# Patient Record
Sex: Female | Born: 1950 | Race: White | Hispanic: No | Smoking: Never smoker
Health system: Southern US, Community
[De-identification: ages and names within clinical notes are randomized; demographics above are authoritative.]

## PROBLEM LIST (undated history)

## (undated) DIAGNOSIS — IMO0001 Reserved for inherently not codable concepts without codable children: Secondary | ICD-10-CM

## (undated) DIAGNOSIS — C541 Malignant neoplasm of endometrium: Principal | ICD-10-CM

## (undated) DIAGNOSIS — I38 Endocarditis, valve unspecified: Secondary | ICD-10-CM

## (undated) DIAGNOSIS — I499 Cardiac arrhythmia, unspecified: Secondary | ICD-10-CM

## (undated) DIAGNOSIS — R011 Cardiac murmur, unspecified: Secondary | ICD-10-CM

## (undated) DIAGNOSIS — Z5189 Encounter for other specified aftercare: Secondary | ICD-10-CM

## (undated) DIAGNOSIS — I890 Lymphedema, not elsewhere classified: Secondary | ICD-10-CM

## (undated) DIAGNOSIS — I10 Essential (primary) hypertension: Secondary | ICD-10-CM

## (undated) DIAGNOSIS — I350 Nonrheumatic aortic (valve) stenosis: Secondary | ICD-10-CM

## (undated) DIAGNOSIS — C439 Malignant melanoma of skin, unspecified: Secondary | ICD-10-CM

## (undated) DIAGNOSIS — E785 Hyperlipidemia, unspecified: Secondary | ICD-10-CM

## (undated) DIAGNOSIS — I509 Heart failure, unspecified: Secondary | ICD-10-CM

## (undated) DIAGNOSIS — F32A Depression, unspecified: Secondary | ICD-10-CM

## (undated) DIAGNOSIS — H269 Unspecified cataract: Secondary | ICD-10-CM

## (undated) DIAGNOSIS — F329 Major depressive disorder, single episode, unspecified: Secondary | ICD-10-CM

## (undated) DIAGNOSIS — L570 Actinic keratosis: Secondary | ICD-10-CM

## (undated) DIAGNOSIS — Z9289 Personal history of other medical treatment: Secondary | ICD-10-CM

## (undated) DIAGNOSIS — I951 Orthostatic hypotension: Secondary | ICD-10-CM

## (undated) HISTORY — PX: MELANOMA EXCISION: SHX5266

## (undated) HISTORY — PX: TONSILLECTOMY: SUR1361

## (undated) HISTORY — DX: Malignant neoplasm of endometrium: C54.1

## (undated) HISTORY — DX: Encounter for other specified aftercare: Z51.89

## (undated) HISTORY — PX: CARDIAC VALVE REPLACEMENT: SHX585

## (undated) HISTORY — DX: Essential (primary) hypertension: I10

## (undated) HISTORY — DX: Heart failure, unspecified: I50.9

## (undated) HISTORY — DX: Actinic keratosis: L57.0

## (undated) HISTORY — PX: EYE SURGERY: SHX253

## (undated) HISTORY — DX: Malignant melanoma of skin, unspecified: C43.9

## (undated) HISTORY — PX: OVARIAN CYST REMOVAL: SHX89

## (undated) HISTORY — DX: Lymphedema, not elsewhere classified: I89.0

## (undated) HISTORY — PX: CORONARY ARTERY BYPASS GRAFT: SHX141

## (undated) HISTORY — PX: CARDIAC CATHETERIZATION: SHX172

## (undated) HISTORY — DX: Depression, unspecified: F32.A

## (undated) HISTORY — DX: Hyperlipidemia, unspecified: E78.5

## (undated) HISTORY — DX: Unspecified cataract: H26.9

## (undated) HISTORY — DX: Major depressive disorder, single episode, unspecified: F32.9

## (undated) HISTORY — DX: Nonrheumatic aortic (valve) stenosis: I35.0

---

## 2009-11-24 DIAGNOSIS — Z85828 Personal history of other malignant neoplasm of skin: Secondary | ICD-10-CM | POA: Insufficient documentation

## 2010-06-07 ENCOUNTER — Ambulatory Visit: Payer: Self-pay

## 2010-06-23 ENCOUNTER — Ambulatory Visit: Payer: Self-pay | Admitting: Family Medicine

## 2010-06-23 DIAGNOSIS — I359 Nonrheumatic aortic valve disorder, unspecified: Secondary | ICD-10-CM

## 2013-03-18 ENCOUNTER — Ambulatory Visit: Payer: Self-pay

## 2013-03-18 LAB — HM PAP SMEAR: HM Pap smear: NORMAL

## 2013-03-23 LAB — HM MAMMOGRAPHY: HM MAMMO: NORMAL

## 2014-01-15 LAB — LIPID PANEL
CHOLESTEROL: 231 mg/dL — AB (ref 0–200)
HDL: 49 mg/dL (ref 35–70)
LDL Cholesterol: 137 mg/dL
Triglycerides: 226 mg/dL — AB (ref 40–160)

## 2014-01-18 LAB — HEMOGLOBIN A1C: HEMOGLOBIN A1C: 5.4 % (ref 4.0–6.0)

## 2014-05-24 HISTORY — PX: OTHER SURGICAL HISTORY: SHX169

## 2014-08-21 ENCOUNTER — Encounter: Payer: Self-pay | Admitting: Family Medicine

## 2014-08-21 DIAGNOSIS — N393 Stress incontinence (female) (male): Secondary | ICD-10-CM | POA: Insufficient documentation

## 2014-08-21 DIAGNOSIS — I499 Cardiac arrhythmia, unspecified: Secondary | ICD-10-CM | POA: Insufficient documentation

## 2014-08-21 DIAGNOSIS — E8881 Metabolic syndrome: Secondary | ICD-10-CM | POA: Insufficient documentation

## 2014-08-21 DIAGNOSIS — Z952 Presence of prosthetic heart valve: Secondary | ICD-10-CM | POA: Insufficient documentation

## 2014-08-21 DIAGNOSIS — Z8619 Personal history of other infectious and parasitic diseases: Secondary | ICD-10-CM | POA: Insufficient documentation

## 2014-08-21 DIAGNOSIS — I10 Essential (primary) hypertension: Secondary | ICD-10-CM | POA: Insufficient documentation

## 2014-08-21 DIAGNOSIS — E785 Hyperlipidemia, unspecified: Secondary | ICD-10-CM | POA: Insufficient documentation

## 2014-08-21 DIAGNOSIS — I350 Nonrheumatic aortic (valve) stenosis: Secondary | ICD-10-CM | POA: Insufficient documentation

## 2014-08-21 DIAGNOSIS — F330 Major depressive disorder, recurrent, mild: Secondary | ICD-10-CM | POA: Insufficient documentation

## 2014-08-21 DIAGNOSIS — N951 Menopausal and female climacteric states: Secondary | ICD-10-CM | POA: Insufficient documentation

## 2014-08-23 ENCOUNTER — Ambulatory Visit (INDEPENDENT_AMBULATORY_CARE_PROVIDER_SITE_OTHER): Payer: BLUE CROSS/BLUE SHIELD | Admitting: Family Medicine

## 2014-08-23 ENCOUNTER — Encounter: Payer: Self-pay | Admitting: Family Medicine

## 2014-08-23 VITALS — BP 138/84 | HR 64 | Temp 98.7°F | Resp 18 | Ht 69.0 in | Wt 284.4 lb

## 2014-08-23 DIAGNOSIS — F329 Major depressive disorder, single episode, unspecified: Secondary | ICD-10-CM | POA: Diagnosis not present

## 2014-08-23 DIAGNOSIS — E8881 Metabolic syndrome: Secondary | ICD-10-CM

## 2014-08-23 DIAGNOSIS — I359 Nonrheumatic aortic valve disorder, unspecified: Secondary | ICD-10-CM | POA: Diagnosis not present

## 2014-08-23 DIAGNOSIS — E669 Obesity, unspecified: Secondary | ICD-10-CM

## 2014-08-23 DIAGNOSIS — I10 Essential (primary) hypertension: Secondary | ICD-10-CM

## 2014-08-23 DIAGNOSIS — Z1211 Encounter for screening for malignant neoplasm of colon: Secondary | ICD-10-CM | POA: Diagnosis not present

## 2014-08-23 DIAGNOSIS — I890 Lymphedema, not elsewhere classified: Secondary | ICD-10-CM

## 2014-08-23 DIAGNOSIS — E668 Other obesity: Secondary | ICD-10-CM

## 2014-08-23 DIAGNOSIS — E785 Hyperlipidemia, unspecified: Secondary | ICD-10-CM

## 2014-08-23 DIAGNOSIS — F32A Depression, unspecified: Secondary | ICD-10-CM

## 2014-08-23 MED ORDER — METOPROLOL TARTRATE 50 MG PO TABS: 50.0000 mg | ORAL_TABLET | Freq: Two times a day (BID) | ORAL | Status: DC

## 2014-08-23 MED ORDER — HYDRALAZINE HCL 25 MG PO TABS: 25.0000 mg | ORAL_TABLET | Freq: Two times a day (BID) | ORAL | Status: DC

## 2014-08-23 MED ORDER — TELMISARTAN-HCTZ 80-25 MG PO TABS: 1.0000 | ORAL_TABLET | Freq: Every day | ORAL | Status: DC

## 2014-08-23 MED ORDER — PRAVASTATIN SODIUM 40 MG PO TABS: 40.0000 mg | ORAL_TABLET | Freq: Every day | ORAL | Status: DC

## 2014-08-23 NOTE — Patient Instructions (Addendum)
Check with insurance about Shingles vaccine coverage  Obesity Obesity is defined as having too much total body fat and a body mass index (BMI) of 30 or more. BMI is an estimate of body fat and is calculated from your height and weight. Obesity happens when you consume more calories than you can burn by exercising or performing daily physical tasks. Prolonged obesity can cause major illnesses or emergencies, such as:   Stroke.  Heart disease.  Diabetes.  Cancer.  Arthritis.  High blood pressure (hypertension).  High cholesterol.  Sleep apnea.  Erectile dysfunction.  Infertility problems. CAUSES   Regularly eating unhealthy foods.  Physical inactivity.  Certain disorders, such as an underactive thyroid (hypothyroidism), Cushing's syndrome, and polycystic ovarian syndrome.  Certain medicines, such as steroids, some depression medicines, and antipsychotics.  Genetics.  Lack of sleep. DIAGNOSIS  A health care provider can diagnose obesity after calculating your BMI. Obesity will be diagnosed if your BMI is 30 or higher.  There are other methods of measuring obesity levels. Some other methods include measuring your skinfold thickness, your waist circumference, and comparing your hip circumference to your waist circumference. TREATMENT  A healthy treatment program includes some or all of the following:  Long-term dietary changes.  Exercise and physical activity.  Behavioral and lifestyle changes.  Medicine only under the supervision of your health care provider. Medicines may help, but only if they are used with diet and exercise programs. An unhealthy treatment program includes:  Fasting.  Fad diets.  Supplements and drugs. These choices do not succeed in long-term weight control.  HOME CARE INSTRUCTIONS   Exercise and perform physical activity as directed by your health care provider. To increase physical activity, try the following:  Use stairs instead of  elevators.  Park farther away from store entrances.  Garden, bike, or walk instead of watching television or using the computer.  Eat healthy, low-calorie foods and drinks on a regular basis. Eat more fruits and vegetables. Use low-calorie cookbooks or take healthy cooking classes.  Limit fast food, sweets, and processed snack foods.  Eat smaller portions.  Keep a daily journal of everything you eat. There are many free websites to help you with this. It may be helpful to measure your foods so you can determine if you are eating the correct portion sizes.  Avoid drinking alcohol. Drink more water and drinks without calories.  Take vitamins and supplements only as recommended by your health care provider.  Weight-loss support groups, Tax adviser, counselors, and stress reduction education can also be very helpful. SEEK IMMEDIATE MEDICAL CARE IF:  You have chest pain or tightness.  You have trouble breathing or feel short of breath.  You have weakness or leg numbness.  You feel confused or have trouble talking.  You have sudden changes in your vision. MAKE SURE YOU:  Understand these instructions.  Will watch your condition.  Will get help right away if you are not doing well or get worse. Document Released: 03/08/2004 Document Revised: 06/15/2013 Document Reviewed: 03/07/2011 [facility] Patient Information 2015 [place], [place]. This information is not intended to replace advice given to you by your health care provider. Make sure you discuss any questions you have with your health care provider.

## 2014-08-23 NOTE — Progress Notes (Signed)
Name: Melody Soto   MRN: [id number]    DOB: [birth year]   Date:08/23/2014       Progress Note  Subjective  Chief Complaint  Chief Complaint  Patient presents with  . Medication Refill  . Hypertension    edema in bilateral feet  . Hyperlipidemia    HPI  HTN: taking medication, but she has not been taking Triamterene/HCTZ, she has noticed weight gain and worsening of lower extremity edema.  BP has been at goal  Hyperlipidemia: taking Pravastatin, every other day, because it was causing shoulder pain.  Sometimes she forgets to take medication  Obesity: eating a good breakfast, but very light lunch , or snack for lunch and eats most of her calories at night. Not exercising.  Depression: she does not want to take medications, she states she feels sorry for herself but not daily.  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   Metabolic Syndrome: WEXH3Z 5.4% six months ago, discussed diet and exercise ago.   Patient Active Problem List   Diagnosis Date Noted  . Aortic valve disorder 08/21/2014  . Benign essential HTN 08/21/2014  . Clinical depression 08/21/2014  . Dyslipidemia 08/21/2014  . Cardiac dysrhythmia 08/21/2014  . Personal history of disorder of nervous system and sense organs 08/21/2014  . Dysmetabolic syndrome 16/96/7893  . Extreme obesity 08/21/2014  . Symptomatic menopausal or female climacteric states 08/21/2014  . 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 History of shingles 08/21/2014  . Personal history of skin cancer 11/24/2009    Past Surgical History  Procedure Laterality Date  . Tonsillectomy    . Melanoma excision    . Ovarian cyst removal    . Cataract surgery Bilateral 05/24/2014    second eye 06/07/2014    Family History  Problem Relation Age of Onset  . Cancer Mother   . Stroke Mother   . Diabetes Sister   . Hypertension Sister     History   Social History  .  Marital Status: Married    Spouse Name: N/A  . Number of Children: N/A  . Years of Education: N/A   Occupational History  . Not on file.   Social History Main Topics  . Smoking status: Never Smoker   . Smokeless tobacco: Never Used  . Alcohol Use: 0.0 oz/week    0 Standard drinks or equivalent per week     Comment: Minimal alcohol consumption  . Drug Use: No  . Sexual Activity: Not Currently   Other Topics Concern  . Not on file   Social History Narrative  . No narrative on file     Current outpatient prescriptions:  .  Ascorbic Acid 100 MG LPOP, Take by mouth., Disp: , Rfl:  .  aspirin 81 MG chewable tablet, Chew by mouth., Disp: , Rfl:  .  B Complex-C (B-COMPLEX WITH VITAMIN C) tablet, Take 1 tablet by mouth daily., Disp: , Rfl:  .  Chromium-Cinnamon (CINNAMON PLUS CHROMIUM) 50-500 MCG-MG CAPS, Take by mouth., Disp: , Rfl:  .  Coenzyme Q10 (COQ10) 50 MG CAPS, Take by mouth., Disp: , Rfl:  .  hydrALAZINE (APRESOLINE) 25 MG tablet, Take 1 tablet (25 mg total) by mouth 2 (two) times daily., Disp: 180 tablet, Rfl: 1 .  Magnesium 100 MG CAPS, Take by mouth., Disp: , Rfl:  .  metoprolol (LOPRESSOR) 50 MG tablet, Take 1 tablet (50 mg total) by mouth 2 (two) times daily.,  Disp: 180 tablet, Rfl: 1 .  MULTIPLE VITAMIN PO, , Disp: , Rfl:  .  Omega-3 Fatty Acids (FISH OIL CONCENTRATE) 300 MG CAPS, Take by mouth., Disp: , Rfl:  .  pravastatin (PRAVACHOL) 40 MG tablet, Take 1 tablet (40 mg total) by mouth daily., Disp: 90 tablet, Rfl: 1 .  telmisartan-hydrochlorothiazide (MICARDIS HCT) 80-25 MG per tablet, Take 1 tablet by mouth daily., Disp: 90 tablet, Rfl: 1  Allergies  Allergen Reactions  . Ace Inhibitors      ROS  Constitutional: Negative for fever but positive for weight change - gained 10 lbs Respiratory: Negative for cough and shortness of breath.   Cardiovascular: Negative for chest pain or palpitations.  Gastrointestinal: Negative for abdominal pain, no bowel changes.   Musculoskeletal: Negative for gait problem or joint swelling.  Skin: Negative for rash.  Neurological: Negative for dizziness or headache.  No other specific complaints in a complete review of systems (except as listed in HPI above).  Objective  Filed Vitals:   08/23/14 1105  BP: 138/84  Pulse: 64  Temp: 98.7 F (37.1 C)  TempSrc: Oral  Resp: 18  Height: 5\' 9"  (1.753 m)  Weight: 284 lb 6.4 oz (129.003 kg)  SpO2: 93%    Body mass index is 41.98 kg/(m^2).  Physical Exam  Constitutional: Patient appears well-developed and well-nourished. No distress. Obesity  Eyes:  No scleral icterus.  Neck: Normal range of motion. Neck supple. Cardiovascular: Normal rate, regular rhythm . Loud systolic ejection murmur audible on 2nd intercostal space bilaterally, but louder on right side and radiating to left side of neck. Lymphedema. Pulmonary/Chest: Effort normal and breath sounds normal. No respiratory distress. Abdominal: Soft.  There is no tenderness. Psychiatric: Patient has a normal mood and affect. behavior is normal. Judgment and thought content normal.   PHQ2/9: Depression screen PHQ 2/9 08/23/2014  Decreased Interest 0  Down, Depressed, Hopeless 0  PHQ - 2 Score 0    Fall Risk: Fall Risk  08/23/2014  Falls in the past year? No    Assessment & Plan  1. Benign essential HTN We will add HCTZ - telmisartan-hydrochlorothiazide (MICARDIS HCT) 80-25 MG per tablet; Take 1 tablet by mouth daily.  Dispense: 90 tablet; Refill: 1 - metoprolol (LOPRESSOR) 50 MG tablet; Take 1 tablet (50 mg total) by mouth 2 (two) times daily.  Dispense: 180 tablet; Refill: 1 - hydrALAZINE (APRESOLINE) 25 MG tablet; Take 1 tablet (25 mg total) by mouth 2 (two) times daily.  Dispense: 180 tablet; Refill: 1 - Comprehensive Metabolic Panel (CMET)  2. Dyslipidemia  - pravastatin (PRAVACHOL) 40 MG tablet; Take 1 tablet (40 mg total) by mouth daily.  Dispense: 90 tablet; Refill: 1 - Lipid Profile  3.  Extreme obesity Discussed diet and exercise  4. Clinical depression Stable, does not want medication   5. Dysmetabolic syndrome  - HgB F0Y  6. Colon cancer screening  - Ambulatory referral to Gastroenterology  7. Aortic valve disorder Discussed repeating Echo but she wants to hold off for now

## 2014-09-06 ENCOUNTER — Telehealth: Payer: Self-pay | Admitting: Gastroenterology

## 2014-09-06 NOTE — Telephone Encounter (Signed)
Gastroenterology Pre-Procedure Review  Request Date: 10-15-2014 Requesting Physician: Dr. Allen Norris  PATIENT REVIEW QUESTIONS: The patient responded to the following health history questions as indicated:    1. Are you having any GI issues? no 2. Do you have a personal history of Polyps? no 3. Do you have a family history of Colon Cancer or Polyps? no 4. Diabetes Mellitus? no 5. Joint replacements in the past 12 months?no 6. Major health problems in the past 3 months?no 7. Any artificial heart valves, MVP, or defibrillator?no    MEDICATIONS & ALLERGIES:    Patient reports the following regarding taking any anticoagulation/antiplatelet therapy:   Plavix, Coumadin, Eliquis, Xarelto, Lovenox, Pradaxa, Brilinta, or Effient? no Aspirin? yes (Daily)  Patient confirms/reports the following medications:  Current Outpatient Prescriptions  Medication Sig Dispense Refill   Ascorbic Acid 100 MG LPOP Take by mouth.     aspirin 81 MG chewable tablet Chew by mouth.     B Complex-C (B-COMPLEX WITH VITAMIN C) tablet Take 1 tablet by mouth daily.     Chromium-Cinnamon (CINNAMON PLUS CHROMIUM) 50-500 MCG-MG CAPS Take by mouth.     Coenzyme Q10 (COQ10) 50 MG CAPS Take by mouth.     hydrALAZINE (APRESOLINE) 25 MG tablet Take 1 tablet (25 mg total) by mouth 2 (two) times daily. 180 tablet 1   Magnesium 100 MG CAPS Take by mouth.     metoprolol (LOPRESSOR) 50 MG tablet Take 1 tablet (50 mg total) by mouth 2 (two) times daily. 180 tablet 1   MULTIPLE VITAMIN PO      Omega-3 Fatty Acids (FISH OIL CONCENTRATE) 300 MG CAPS Take by mouth.     pravastatin (PRAVACHOL) 40 MG tablet Take 1 tablet (40 mg total) by mouth daily. 90 tablet 1   telmisartan-hydrochlorothiazide (MICARDIS HCT) 80-25 MG per tablet Take 1 tablet by mouth daily. 90 tablet 1   No current facility-administered medications for this visit.    Patient confirms/reports the following allergies:  Allergies  Allergen Reactions   Ace  Inhibitors     No orders of the defined types were placed in this encounter.    AUTHORIZATION INFORMATION Primary Insurance: 1D#: Group #:  Secondary Insurance: 1D#: Group #:  SCHEDULE INFORMATION: Date: 10-15-2014 Time: Location:[facility]

## 2014-09-24 ENCOUNTER — Other Ambulatory Visit: Payer: Self-pay

## 2014-11-24 ENCOUNTER — Encounter: Payer: Self-pay | Admitting: Anesthesiology

## 2014-11-24 ENCOUNTER — Telehealth: Payer: Self-pay

## 2014-11-24 NOTE — Telephone Encounter (Signed)
LVM for pt to return my call to discuss her procedure that is scheduled for 12/01/14. [place] called stating the Anesthesiologist is requesting pt have an ECHO per Dr. Ancil Boozer previous note in July prior to allowing her to come to [facility].

## 2014-11-25 ENCOUNTER — Other Ambulatory Visit: Payer: Self-pay | Admitting: Family Medicine

## 2014-11-25 DIAGNOSIS — I359 Nonrheumatic aortic valve disorder, unspecified: Secondary | ICD-10-CM

## 2014-11-25 NOTE — Telephone Encounter (Signed)
Called pt back to see if she received a call to schedule the ECHO for her clearance. Pt has been scheduled for this Friday at 10. Waiting for clearance.

## 2014-11-26 ENCOUNTER — Ambulatory Visit
Admission: RE | Admit: 2014-11-26 | Discharge: 2014-11-26 | Disposition: A | Discharge status: Home / Self Care | Payer: BLUE CROSS/BLUE SHIELD | Source: Ambulatory Visit | Attending: Family Medicine | Admitting: Family Medicine

## 2014-11-26 DIAGNOSIS — I350 Nonrheumatic aortic (valve) stenosis: Secondary | ICD-10-CM

## 2014-11-26 DIAGNOSIS — I359 Nonrheumatic aortic valve disorder, unspecified: Secondary | ICD-10-CM | POA: Insufficient documentation

## 2014-11-26 DIAGNOSIS — I517 Cardiomegaly: Secondary | ICD-10-CM

## 2014-11-26 NOTE — Progress Notes (Signed)
*  PRELIMINARY RESULTS* Echocardiogram 2D Echocardiogram has been performed.  Melody Soto 11/26/2014, 11:44 AM

## 2014-11-27 DIAGNOSIS — I517 Cardiomegaly: Secondary | ICD-10-CM | POA: Insufficient documentation

## 2014-11-29 ENCOUNTER — Other Ambulatory Visit: Payer: Self-pay

## 2014-11-29 ENCOUNTER — Other Ambulatory Visit: Payer: Self-pay | Admitting: Family Medicine

## 2014-11-29 DIAGNOSIS — I350 Nonrheumatic aortic (valve) stenosis: Secondary | ICD-10-CM

## 2014-11-30 NOTE — Discharge Instructions (Signed)

## 2014-12-01 ENCOUNTER — Ambulatory Visit
Admission: RE | Admit: 2014-12-01 | Payer: BLUE CROSS/BLUE SHIELD | Source: Ambulatory Visit | Admitting: Gastroenterology

## 2014-12-01 ENCOUNTER — Encounter: Payer: BLUE CROSS/BLUE SHIELD | Admitting: Family Medicine

## 2014-12-01 HISTORY — DX: Cardiac murmur, unspecified: R01.1

## 2014-12-01 HISTORY — DX: Reserved for inherently not codable concepts without codable children: IMO0001

## 2014-12-01 SURGERY — COLONOSCOPY WITH PROPOFOL
Anesthesia: Choice

## 2015-01-13 ENCOUNTER — Telehealth: Payer: Self-pay | Admitting: Family Medicine

## 2015-01-13 NOTE — Telephone Encounter (Signed)
Abbye from [facility] requesting return call pertaining to Dr Ancil Boozer patient Melody Soto--DOS: 11/26/2014 with CPT CODE: 60454 [phone]

## 2015-02-10 ENCOUNTER — Encounter: Payer: BLUE CROSS/BLUE SHIELD | Admitting: Family Medicine

## 2015-02-16 ENCOUNTER — Ambulatory Visit: Payer: BLUE CROSS/BLUE SHIELD | Admitting: Family Medicine

## 2015-02-24 ENCOUNTER — Ambulatory Visit (INDEPENDENT_AMBULATORY_CARE_PROVIDER_SITE_OTHER): Payer: BLUE CROSS/BLUE SHIELD | Admitting: Family Medicine

## 2015-02-24 ENCOUNTER — Encounter: Payer: Self-pay | Admitting: Family Medicine

## 2015-02-24 VITALS — BP 136/92 | HR 78 | Temp 98.5°F | Resp 18 | Ht 69.0 in | Wt 292.5 lb

## 2015-02-24 DIAGNOSIS — Z23 Encounter for immunization: Secondary | ICD-10-CM | POA: Diagnosis not present

## 2015-02-24 DIAGNOSIS — F330 Major depressive disorder, recurrent, mild: Secondary | ICD-10-CM

## 2015-02-24 DIAGNOSIS — E8881 Metabolic syndrome: Secondary | ICD-10-CM | POA: Diagnosis not present

## 2015-02-24 DIAGNOSIS — I10 Essential (primary) hypertension: Secondary | ICD-10-CM

## 2015-02-24 DIAGNOSIS — E785 Hyperlipidemia, unspecified: Secondary | ICD-10-CM

## 2015-02-24 DIAGNOSIS — I350 Nonrheumatic aortic (valve) stenosis: Secondary | ICD-10-CM

## 2015-02-24 DIAGNOSIS — Z1211 Encounter for screening for malignant neoplasm of colon: Secondary | ICD-10-CM | POA: Diagnosis not present

## 2015-02-24 DIAGNOSIS — R21 Rash and other nonspecific skin eruption: Secondary | ICD-10-CM

## 2015-02-24 MED ORDER — HYDRALAZINE HCL 25 MG PO TABS: 25.0000 mg | ORAL_TABLET | Freq: Two times a day (BID) | ORAL | Status: DC

## 2015-02-24 MED ORDER — PRAVASTATIN SODIUM 20 MG PO TABS: 20.0000 mg | ORAL_TABLET | Freq: Every day | ORAL | Status: DC

## 2015-02-24 MED ORDER — LORATADINE 10 MG PO TABS: 10.0000 mg | ORAL_TABLET | Freq: Every day | ORAL | Status: DC

## 2015-02-24 MED ORDER — TELMISARTAN-HCTZ 80-25 MG PO TABS: 1.0000 | ORAL_TABLET | Freq: Every day | ORAL | Status: DC

## 2015-02-24 MED ORDER — METOPROLOL TARTRATE 50 MG PO TABS: 50.0000 mg | ORAL_TABLET | Freq: Two times a day (BID) | ORAL | Status: DC

## 2015-02-24 NOTE — Progress Notes (Addendum)
Name: Melody Soto   MRN: [id number]    DOB: [birth year]   Date:02/24/2015       Progress Note  Subjective  Chief Complaint  Chief Complaint  Patient presents with  . Medication Refill    6 month F/U  . Hypertension    Medication Telmisartan-HCTZ-changed [employer] when picked up prescription3 days ago and since taken it has had a rash all over bilateral thighs and back, very itchy. So patient quit taking it and doubled up Metoprolol. Patient has been putting a aveeno cream for the itching with some relief.   . Hyperlipidemia    HPI  HTN: she was doing well with all of her medications, however on 12/27 she got a new prescription of Micardis/HCTZ and this time it was not on blister pack, she noticed generalized pruritus within 24 hours of taking the medications.  She is now taking double dose of metoprolol and stopped Micardis /HCTZ 2 days ago and itching has decreased some, still scratching, rash is not on hands, no burrows. She has remote history of scabies and it does not feel like the same symptoms.   Hyperlipidemia: taking Pravastatin, every other day, no myalgais.   Obesity: she has gained weight since last visit, she has been eating more since increase in stress recently, not following a diet. Not exercising.  Depression: she does not want to take medications, felt very bad around Christmas when she found out her ex-husband got engaged and she was crying and feeling very upset. Feeling better now, she has a support group ( church friends ). She states she has closure now.   Metabolic Syndrome: [id number] 5.4% one year ago, denies polydipsia or polyuria   Rash: started shortly after medication change. Itchy no rashes  Patient Active Problem List   Diagnosis Date Noted  . Left ventricular hypertrophy 11/27/2014  . Lymphedema of lower extremity 08/23/2014  . Severe aortic stenosis 08/21/2014  . Benign essential HTN 08/21/2014  . Depression, major, recurrent, mild ([place])  08/21/2014  . Dyslipidemia 08/21/2014  . Cardiac dysrhythmia 08/21/2014  . Dysmetabolic syndrome 0000000  . Extreme obesity ([place]) 08/21/2014  . Symptomatic menopausal or female climacteric states 08/21/2014  . 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 History of shingles 08/21/2014  . Personal history of skin cancer 11/24/2009    Past Surgical History  Procedure Laterality Date  . Tonsillectomy    . Melanoma excision    . Ovarian cyst removal      EXPLORATORY LAPAROTOMY  . Cataract surgery Bilateral 05/24/2014    second eye 06/07/2014    Family History  Problem Relation Age of Onset  . Cancer Mother   . Stroke Mother   . Diabetes Sister   . Hypertension Sister     Social History   Social History  . Marital Status: Divorced    Spouse Name: N/A  . Number of Children: N/A  . Years of Education: N/A   Occupational History  . Not on file.   Social History Main Topics  . Smoking status: Never Smoker   . Smokeless tobacco: Never Used  . Alcohol Use: 0.0 oz/week    0 Standard drinks or equivalent per week     Comment: Minimal alcohol consumption WINE   . Drug Use: No  . Sexual Activity: Not Currently   Other Topics Concern  . Not on file   Social History Narrative     Current outpatient prescriptions:  .  Ascorbic Acid 100 MG LPOP, Take by  mouth. AM, Disp: , Rfl:  .  aspirin 81 MG chewable tablet, Chew by mouth. PM, Disp: , Rfl:  .  B Complex-C (B-COMPLEX WITH VITAMIN C) tablet, Take 1 tablet by mouth daily. AM, Disp: , Rfl:  .  Chromium-Cinnamon (CINNAMON PLUS CHROMIUM) 50-500 MCG-MG CAPS, Take by mouth. AM, Disp: , Rfl:  .  Coenzyme Q10 (COQ10) 50 MG CAPS, Take 1 tablet by mouth. , Disp: , Rfl:  .  hydrALAZINE (APRESOLINE) 25 MG tablet, Take 1 tablet (25 mg total) by mouth 2 (two) times daily. AM AND PM, Disp: 180 tablet, Rfl: 1 .  Magnesium 100 MG CAPS, Take by mouth. PM, Disp: , Rfl:  .  metoprolol (LOPRESSOR) 50 MG tablet, Take 1 tablet (50 mg  total) by mouth 2 (two) times daily., Disp: 180 tablet, Rfl: 1 .  MULTIPLE VITAMIN PO, , Disp: , Rfl:  .  Omega-3 Fatty Acids (FISH OIL CONCENTRATE) 300 MG CAPS, Take by mouth., Disp: , Rfl:  .  pravastatin (PRAVACHOL) 20 MG tablet, Take 1 tablet (20 mg total) by mouth daily., Disp: 90 tablet, Rfl: 1 .  telmisartan-hydrochlorothiazide (MICARDIS HCT) 80-25 MG tablet, Take 1 tablet by mouth daily. It must be in blister packs - has a rash with the regular tablets, Disp: 90 tablet, Rfl: 1 .  loratadine (CLARITIN) 10 MG tablet, Take 1 tablet (10 mg total) by mouth daily., Disp: 30 tablet, Rfl: 0  Allergies  Allergen Reactions  . Ace Inhibitors Other (See Comments)    UNKNOWN REACTION     ROS  Constitutional: Negative for fever , positive for  weight change.  Respiratory: Negative for cough and shortness of breath.   Cardiovascular: Negative for chest pain or palpitations.  Gastrointestinal: Negative for abdominal pain, no bowel changes.  Musculoskeletal: Negative for gait problem or joint swelling.  Skin: Positive for rash.  Neurological: Negative for dizziness or headache.  No other specific complaints in a complete review of systems (except as listed in HPI above).  Objective  Filed Vitals:   02/24/15 1011  BP: 136/92  Pulse: 78  Temp: 98.5 F (36.9 C)  TempSrc: Oral  Resp: 18  Height: 5\' 9"  (1.753 m)  Weight: 292 lb 8 oz (132.677 kg)  SpO2: 97%    Body mass index is 43.17 kg/(m^2).  Physical Exam  Constitutional: Patient appears well-developed and well-nourished. Obese No distress.  HEENT: head atraumatic, normocephalic, pupils equal and reactive to light,neck supple, throat within normal limits Cardiovascular: Normal rate, regular rhythm and loud systolic ejection murmur on 2nd right intercostal space Non-pitting  BLE edema. Pulmonary/Chest: Effort normal and breath sounds normal. No respiratory distress. Abdominal: Soft.  There is no tenderness. Psychiatric: Patient  has a normal mood and affect. behavior is normal. Judgment and thought content normal. Skin: multiple areas of excoriation on inner thighs, back, very few on abdomen None on hands, a few on right arm, not under axilla  PHQ2/9: Depression screen [facility] 2/9 02/24/2015 08/23/2014  Decreased Interest 0 0  Down, Depressed, Hopeless 0 0  PHQ - 2 Score 0 0     Fall Risk: Fall Risk  02/24/2015 08/23/2014  Falls in the past year? No No     Functional Status Survey: Is the patient deaf or have difficulty hearing?: No Does the patient have difficulty seeing, even when wearing glasses/contacts?: Yes (reading glasses) Does the patient have difficulty concentrating, remembering, or making decisions?: No Does the patient have difficulty walking or climbing stairs?: No Does the patient  have difficulty dressing or bathing?: No Does the patient have difficulty doing errands alone such as visiting a doctor's office or shopping?: No    Assessment & Plan  1. Severe aortic stenosis  She had Echo seeing Dr. Clayborn Bigness, did not have colonoscopy because of it, we will try cologuard, no symptoms of aortic stenosis.   2. Needs flu shot   refused- Flu Vaccine QUAD 36+ mos IM  3. Benign essential HTN  We will try blister pack   Micardis, and if no improvement we will have to switch to another medication  - telmisartan-hydrochlorothiazide (MICARDIS HCT) 80-25 MG tablet; Take 1 tablet by mouth daily. It must be in blister packs - has a rash with the regular tablets  Dispense: 90 tablet; Refill: 1 - metoprolol (LOPRESSOR) 50 MG tablet; Take 1 tablet (50 mg total) by mouth 2 (two) times daily.  Dispense: 180 tablet; Refill: 1 - hydrALAZINE (APRESOLINE) 25 MG tablet; Take 1 tablet (25 mg total) by mouth 2 (two) times daily. AM AND PM  Dispense: 180 tablet; Refill: 1 - Comprehensive metabolic panel  4. Depression, major, recurrent, mild ([place])  Doing better, refuses medication   5. Dyslipidemia  Currently  taking half of 40 mg every other day, we will change to 20 mg daily and check lipid panel  - pravastatin (PRAVACHOL) 20 MG tablet; Take 1 tablet (20 mg total) by mouth daily.  Dispense: 90 tablet; Refill: 1 - Lipid panel  6. Colon cancer screening  We will try Colguard, she will check coverage with insurance.  - Cologuard  7. Need for Streptococcus pneumoniae vaccination  - Pneumococcal polysaccharide vaccine 23-valent greater than or equal to 2yo subcutaneous/IM - refused  8. Need for shingles vaccine  - Varicella-zoster vaccine subcutaneous - refused  9. Rash  Possible allergic reaction, but call back if no improvement try Eucerin or Lubriderm - loratadine (CLARITIN) 10 MG tablet; Take 1 tablet (10 mg total) by mouth daily.  Dispense: 30 tablet; Refill: 0

## 2015-02-28 ENCOUNTER — Ambulatory Visit: Payer: BLUE CROSS/BLUE SHIELD | Admitting: Family Medicine

## 2015-03-21 ENCOUNTER — Encounter: Payer: BLUE CROSS/BLUE SHIELD | Admitting: Family Medicine

## 2015-03-29 ENCOUNTER — Encounter: Payer: BLUE CROSS/BLUE SHIELD | Admitting: Family Medicine

## 2015-03-31 ENCOUNTER — Encounter: Payer: BLUE CROSS/BLUE SHIELD | Admitting: Family Medicine

## 2015-05-18 LAB — COMPREHENSIVE METABOLIC PANEL
ALBUMIN: 4.1 g/dL (ref 3.6–4.8)
ALT: 31 IU/L (ref 0–32)
AST: 23 IU/L (ref 0–40)
Albumin/Globulin Ratio: 1.6 (ref 1.2–2.2)
Alkaline Phosphatase: 68 IU/L (ref 39–117)
BUN / CREAT RATIO: 28 (ref 12–28)
BUN: 21 mg/dL (ref 8–27)
Bilirubin Total: 0.4 mg/dL (ref 0.0–1.2)
CO2: 22 mmol/L (ref 18–29)
CREATININE: 0.76 mg/dL (ref 0.57–1.00)
Calcium: 9.6 mg/dL (ref 8.7–10.3)
Chloride: 99 mmol/L (ref 96–106)
GFR, EST AFRICAN AMERICAN: 97 mL/min/{1.73_m2} (ref >59)
GFR, EST NON AFRICAN AMERICAN: 84 mL/min/{1.73_m2} (ref >59)
GLOBULIN, TOTAL: 2.5 g/dL (ref 1.5–4.5)
GLUCOSE: 133 mg/dL — AB (ref 65–99)
Potassium: 4.4 mmol/L (ref 3.5–5.2)
SODIUM: 139 mmol/L (ref 134–144)
TOTAL PROTEIN: 6.6 g/dL (ref 6.0–8.5)

## 2015-05-18 LAB — LIPID PANEL
CHOLESTEROL TOTAL: 203 mg/dL — AB (ref 100–199)
Chol/HDL Ratio: 4.3 ratio units (ref 0.0–4.4)
HDL: 47 mg/dL (ref >39)
LDL Calculated: 123 mg/dL — ABNORMAL HIGH (ref 0–99)
Triglycerides: 167 mg/dL — ABNORMAL HIGH (ref 0–149)
VLDL CHOLESTEROL CAL: 33 mg/dL (ref 5–40)

## 2015-05-19 ENCOUNTER — Encounter: Payer: Self-pay | Admitting: Family Medicine

## 2015-05-19 ENCOUNTER — Ambulatory Visit (INDEPENDENT_AMBULATORY_CARE_PROVIDER_SITE_OTHER): Payer: BLUE CROSS/BLUE SHIELD | Admitting: Family Medicine

## 2015-05-19 VITALS — BP 122/68 | HR 80 | Temp 98.3°F | Resp 16 | Ht 69.0 in | Wt 282.2 lb

## 2015-05-19 DIAGNOSIS — I10 Essential (primary) hypertension: Secondary | ICD-10-CM

## 2015-05-19 DIAGNOSIS — L299 Pruritus, unspecified: Secondary | ICD-10-CM | POA: Diagnosis not present

## 2015-05-19 DIAGNOSIS — Z0000 Encounter for general adult medical examination without abnormal findings: Secondary | ICD-10-CM | POA: Diagnosis not present

## 2015-05-19 DIAGNOSIS — F330 Major depressive disorder, recurrent, mild: Secondary | ICD-10-CM | POA: Diagnosis not present

## 2015-05-19 DIAGNOSIS — Z1211 Encounter for screening for malignant neoplasm of colon: Secondary | ICD-10-CM | POA: Diagnosis not present

## 2015-05-19 DIAGNOSIS — Z01419 Encounter for gynecological examination (general) (routine) without abnormal findings: Secondary | ICD-10-CM

## 2015-05-19 DIAGNOSIS — E8881 Metabolic syndrome: Secondary | ICD-10-CM | POA: Diagnosis not present

## 2015-05-19 DIAGNOSIS — Z1239 Encounter for other screening for malignant neoplasm of breast: Secondary | ICD-10-CM

## 2015-05-19 MED ORDER — HYDROXYZINE HCL 10 MG PO TABS: 10.0000 mg | ORAL_TABLET | Freq: Three times a day (TID) | ORAL | Status: DC | PRN

## 2015-05-19 NOTE — Progress Notes (Signed)
Name: Melody Soto   MRN: [id number]    DOB: September 03, 1950   Date:05/19/2015       Progress Note  Subjective  Chief Complaint  Chief Complaint  Patient presents with  . Annual Exam    HPI  Well Woman exam: no vaginal discharge, not sexually active, no post-menopausal bleeding.   Pruritus: she started to have generalized pruritus, excoriation from the scratching. No rashes. Symptoms started after episode of great stress in December. She also thought it was secondary to change in brand of her medication, but she is back on previous brand and still has symptoms. At times she can't sleep at night.   HTN: bp is at goal, discussed stopped HCTZ because it can be the cause of pruritus but she states she just filled a 90 day prescription and wants to hold off for now.   Metabolic Syndrome fasting glucose of 133, no polyphagia, polydipsia or polyuria. We will add hgbA1C and explained she may have DM, she is losing weight and changed her diet.   Depression: she does not want to take medications, felt very bad around Christmas when she found out her ex-husband got engaged and she was crying and feeling very upset. Feeling better now, she has a support group ( church friends ). Currently her ex-husband is no longer with fiancee. Still stressed because daughter is now going through a divorce. Denies suicidal thoughts and ideation.   Patient Active Problem List   Diagnosis Date Noted  . Left ventricular hypertrophy 11/27/2014  . Lymphedema of lower extremity 08/23/2014  . Severe aortic stenosis 08/21/2014  . Benign essential HTN 08/21/2014  . Depression, major, recurrent, mild ([place]) 08/21/2014  . Dyslipidemia 08/21/2014  . Cardiac dysrhythmia 08/21/2014  . Dysmetabolic syndrome 0000000  . Extreme obesity ([place]) 08/21/2014  . Symptomatic menopausal or female climacteric states 08/21/2014  . 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 History of shingles 08/21/2014  . Personal history of  skin cancer 11/24/2009    Past Surgical History  Procedure Laterality Date  . Tonsillectomy    . Melanoma excision    . Ovarian cyst removal      EXPLORATORY LAPAROTOMY  . Cataract surgery Bilateral 05/24/2014    second eye 06/07/2014    Family History  Problem Relation Age of Onset  . Cancer Mother   . Stroke Mother   . Diabetes Sister   . Hypertension Sister     Social History   Social History  . Marital Status: Divorced    Spouse Name: N/A  . Number of Children: N/A  . Years of Education: N/A   Occupational History  . Not on file.   Social History Main Topics  . Smoking status: Never Smoker   . Smokeless tobacco: Never Used  . Alcohol Use: 0.0 oz/week    0 Standard drinks or equivalent per week     Comment: Minimal alcohol consumption WINE   . Drug Use: No  . Sexual Activity: No   Other Topics Concern  . Not on file   Social History Narrative     Current outpatient prescriptions:  .  Ascorbic Acid 100 MG LPOP, Take by mouth. AM, Disp: , Rfl:  .  aspirin 81 MG chewable tablet, Chew by mouth. PM, Disp: , Rfl:  .  B Complex-C (B-COMPLEX WITH VITAMIN C) tablet, Take 1 tablet by mouth daily. AM, Disp: , Rfl:  .  Chromium-Cinnamon (CINNAMON PLUS CHROMIUM) 50-500 MCG-MG CAPS, Take by mouth. AM, Disp: , Rfl:  .  Coenzyme Q10 (COQ10) 50 MG CAPS, Take 1 tablet by mouth. , Disp: , Rfl:  .  hydrALAZINE (APRESOLINE) 25 MG tablet, Take 1 tablet (25 mg total) by mouth 2 (two) times daily. AM AND PM, Disp: 180 tablet, Rfl: 1 .  loratadine (CLARITIN) 10 MG tablet, Take 1 tablet (10 mg total) by mouth daily., Disp: 30 tablet, Rfl: 0 .  Magnesium 100 MG CAPS, Take by mouth. PM, Disp: , Rfl:  .  metoprolol (LOPRESSOR) 50 MG tablet, Take 1 tablet (50 mg total) by mouth 2 (two) times daily., Disp: 180 tablet, Rfl: 1 .  Omega-3 Fatty Acids (FISH OIL CONCENTRATE) 300 MG CAPS, Take by mouth., Disp: , Rfl:  .  pravastatin (PRAVACHOL) 20 MG tablet, Take 1 tablet (20 mg total) by  mouth daily., Disp: 90 tablet, Rfl: 1 .  telmisartan-hydrochlorothiazide (MICARDIS HCT) 80-25 MG tablet, Take 1 tablet by mouth daily. It must be in blister packs - has a rash with the regular tablets, Disp: 90 tablet, Rfl: 1 .  hydrOXYzine (ATARAX/VISTARIL) 10 MG tablet, Take 1 tablet (10 mg total) by mouth 3 (three) times daily as needed for itching or anxiety., Disp: 90 tablet, Rfl: 0 .  MULTIPLE VITAMIN PO, Reported on 05/19/2015, Disp: , Rfl:   Allergies  Allergen Reactions  . Ace Inhibitors Other (See Comments)    UNKNOWN REACTION     ROS  Constitutional: Negative for fever, positive for  weight changes.  Respiratory: Negative for cough and shortness of breath.   Cardiovascular: Negative for chest pain or palpitations.  Gastrointestinal: Negative for abdominal pain, no bowel changes.  Musculoskeletal: Negative for gait problem or joint swelling.  Skin: Negative for rash, she has itching.   Neurological: Negative for dizziness or headache.  No other specific complaints in a complete review of systems (except as listed in HPI above).  Objective  Filed Vitals:   05/19/15 0837  BP: 122/68  Pulse: 80  Temp: 98.3 F (36.8 C)  TempSrc: Oral  Resp: 16  Height: 5\' 9"  (1.753 m)  Weight: 282 lb 3.2 oz (128.005 kg)  SpO2: 94%    Body mass index is 41.65 kg/(m^2).  Physical Exam  Constitutional: Patient appears well-developed and obese No distress.  HENT: Head: Normocephalic and atraumatic. Ears: B TMs ok, no erythema or effusion; Nose: Nose normal. Mouth/Throat: Oropharynx is clear and moist. No oropharyngeal exudate.  Eyes: Conjunctivae and EOM are normal. Pupils are equal, round, and reactive to light. No scleral icterus.  Neck: Normal range of motion. Neck supple. No JVD present. No thyromegaly present.  Cardiovascular: Normal rate, regular rhythm, systolic ejection murmur [id number] . Marland Kitchen  BLE lymphedema Pulmonary/Chest: Effort normal and breath sounds normal. No respiratory  distress. Abdominal: Soft. Bowel sounds are normal, no distension. There is no tenderness. no masses Breast: no lumps or masses, no nipple discharge or rashes FEMALE GENITALIA:  External genitalia normal External urethra normal Vulva shows some sebaceous cyst RECTAL: not done Musculoskeletal: Normal range of motion, no joint effusions. No gross deformities Neurological: he is alert and oriented to person, place, and time. No cranial nerve deficit. Coordination, balance, strength, speech and gait are normal.  Skin: Skin is warm and dry. Excoriation  Psychiatric: Patient has a normal mood and affect. behavior is normal. Judgment and thought content normal.  Recent Results (from the past 2160 hour(s))  Lipid panel     Status: Abnormal   Collection Time: 05/17/15 10:35 AM  Result Value Ref Range   Cholesterol, Total  203 (H) 100 - 199 mg/dL   Triglycerides 167 (H) 0 - 149 mg/dL   HDL 47 >39 mg/dL   VLDL Cholesterol Cal 33 5 - 40 mg/dL   LDL Calculated 123 (H) 0 - 99 mg/dL   Chol/HDL Ratio 4.3 0.0 - 4.4 ratio units    Comment:                                   T. Chol/HDL Ratio                                             Men  Women                               1/2 Avg.Risk  3.4    3.3                                   Avg.Risk  5.0    4.4                                2X Avg.Risk  9.6    7.1                                3X Avg.Risk 23.4   11.0   Comprehensive metabolic panel     Status: Abnormal   Collection Time: 05/17/15 10:35 AM  Result Value Ref Range   Glucose 133 (H) 65 - 99 mg/dL   BUN 21 8 - 27 mg/dL   Creatinine, Ser 0.76 0.57 - 1.00 mg/dL   GFR calc non Af Amer 84 >59 mL/min/1.73   GFR calc Af Amer 97 >59 mL/min/1.73   BUN/Creatinine Ratio 28 12 - 28    Comment:               **Please note reference interval change**   Sodium 139 134 - 144 mmol/L   Potassium 4.4 3.5 - 5.2 mmol/L   Chloride 99 96 - 106 mmol/L   CO2 22 18 - 29 mmol/L   Calcium 9.6 8.7 - 10.3 mg/dL    Total Protein 6.6 6.0 - 8.5 g/dL   Albumin 4.1 3.6 - 4.8 g/dL   Globulin, Total 2.5 1.5 - 4.5 g/dL   Albumin/Globulin Ratio 1.6 1.2 - 2.2    Comment:               **Please note reference interval change**   Bilirubin Total 0.4 0.0 - 1.2 mg/dL   Alkaline Phosphatase 68 39 - 117 IU/L   AST 23 0 - 40 IU/L   ALT 31 0 - 32 IU/L      PHQ2/9: Depression screen [facility] 2/9 02/24/2015 08/23/2014  Decreased Interest 0 0  Down, Depressed, Hopeless 0 0  PHQ - 2 Score 0 0    Fall Ris   Fall Risk  02/24/2015 08/23/2014  Falls in the past year? No No     Assessment & Plan  1. Well woman exam  Discussed importance of 150 minutes of physical activity weekly, eat two servings  of fish weekly, eat one serving of tree nuts ( cashews, pistachios, pecans, almonds.Marland Kitchen) every other day, eat 6 servings of fruit/vegetables daily and drink plenty of water and avoid sweet beverages.   2. Breast cancer screening  - [facility] Digital Screening; Future  3. Pruritus  - Ambulatory referral to Dermatology - needs biopsy, but it may be secondary to stress - hydrOXYzine (ATARAX/VISTARIL) 10 MG tablet; Take 1 tablet (10 mg total) by mouth 3 (three) times daily as needed for itching or anxiety.  Dispense: 90 tablet; Refill: 0  4. Depression, major, recurrent, mild ([place])  She refused medication at this time, discussed therapy but she does not have pain   5. Dysmetabolic syndrome  - Hemoglobin A1c  6. Benign essential HTN  - TSH - CBC with Differential/Platelet  7. Colon cancer screening  - Cologuard

## 2015-06-02 ENCOUNTER — Ambulatory Visit
Admission: RE | Admit: 2015-06-02 | Discharge: 2015-06-02 | Disposition: A | Discharge status: Home / Self Care | Payer: BLUE CROSS/BLUE SHIELD | Source: Ambulatory Visit | Attending: Family Medicine | Admitting: Family Medicine

## 2015-06-02 DIAGNOSIS — Z1239 Encounter for other screening for malignant neoplasm of breast: Secondary | ICD-10-CM

## 2015-06-02 DIAGNOSIS — Z1231 Encounter for screening mammogram for malignant neoplasm of breast: Secondary | ICD-10-CM | POA: Insufficient documentation

## 2015-08-25 ENCOUNTER — Ambulatory Visit: Payer: BLUE CROSS/BLUE SHIELD | Admitting: Family Medicine

## 2015-11-11 ENCOUNTER — Telehealth: Payer: Self-pay | Admitting: Family Medicine

## 2015-11-11 DIAGNOSIS — I10 Essential (primary) hypertension: Secondary | ICD-10-CM

## 2015-11-16 NOTE — Telephone Encounter (Signed)
Patient has appt for 11/24/15

## 2015-11-23 ENCOUNTER — Other Ambulatory Visit: Payer: Self-pay | Admitting: Family Medicine

## 2015-11-24 ENCOUNTER — Encounter: Payer: Self-pay | Admitting: Family Medicine

## 2015-11-24 ENCOUNTER — Ambulatory Visit (INDEPENDENT_AMBULATORY_CARE_PROVIDER_SITE_OTHER): Payer: BLUE CROSS/BLUE SHIELD | Admitting: Family Medicine

## 2015-11-24 VITALS — BP 116/62 | HR 76 | Temp 98.6°F | Resp 16 | Ht 69.0 in | Wt 286.4 lb

## 2015-11-24 DIAGNOSIS — I350 Nonrheumatic aortic (valve) stenosis: Secondary | ICD-10-CM | POA: Diagnosis not present

## 2015-11-24 DIAGNOSIS — I890 Lymphedema, not elsewhere classified: Secondary | ICD-10-CM

## 2015-11-24 DIAGNOSIS — Z2911 Encounter for prophylactic immunotherapy for respiratory syncytial virus (RSV): Secondary | ICD-10-CM

## 2015-11-24 DIAGNOSIS — F330 Major depressive disorder, recurrent, mild: Secondary | ICD-10-CM | POA: Diagnosis not present

## 2015-11-24 DIAGNOSIS — E785 Hyperlipidemia, unspecified: Secondary | ICD-10-CM | POA: Diagnosis not present

## 2015-11-24 DIAGNOSIS — E668 Other obesity: Secondary | ICD-10-CM

## 2015-11-24 DIAGNOSIS — Z23 Encounter for immunization: Secondary | ICD-10-CM | POA: Diagnosis not present

## 2015-11-24 DIAGNOSIS — E8881 Metabolic syndrome: Secondary | ICD-10-CM

## 2015-11-24 DIAGNOSIS — I10 Essential (primary) hypertension: Secondary | ICD-10-CM

## 2015-11-24 LAB — HEMOGLOBIN A1C
ESTIMATED AVERAGE GLUCOSE: 126 mg/dL
HEMOGLOBIN A1C: 6 % — AB (ref 4.8–5.6)

## 2015-11-24 LAB — CBC WITH DIFFERENTIAL/PLATELET
BASOS ABS: 0.1 10*3/uL (ref 0.0–0.2)
Basos: 1 %
EOS (ABSOLUTE): 0.2 10*3/uL (ref 0.0–0.4)
Eos: 3 %
HEMOGLOBIN: 13.6 g/dL (ref 11.1–15.9)
Hematocrit: 40.2 % (ref 34.0–46.6)
IMMATURE GRANS (ABS): 0 10*3/uL (ref 0.0–0.1)
IMMATURE GRANULOCYTES: 0 %
LYMPHS: 25 %
Lymphocytes Absolute: 1.8 10*3/uL (ref 0.7–3.1)
MCH: 32.8 pg (ref 26.6–33.0)
MCHC: 33.8 g/dL (ref 31.5–35.7)
MCV: 97 fL (ref 79–97)
MONOCYTES: 8 %
Monocytes Absolute: 0.6 10*3/uL (ref 0.1–0.9)
NEUTROS PCT: 63 %
Neutrophils Absolute: 4.7 10*3/uL (ref 1.4–7.0)
PLATELETS: 307 10*3/uL (ref 150–379)
RBC: 4.15 x10E6/uL (ref 3.77–5.28)
RDW: 14 % (ref 12.3–15.4)
WBC: 7.3 10*3/uL (ref 3.4–10.8)

## 2015-11-24 LAB — COLOGUARD: COLOGUARD: NEGATIVE

## 2015-11-24 LAB — TSH: TSH: 0.885 u[IU]/mL (ref 0.450–4.500)

## 2015-11-24 MED ORDER — PRAVASTATIN SODIUM 20 MG PO TABS: 20.0000 mg | ORAL_TABLET | Freq: Every day | ORAL | 1 refills | Status: DC

## 2015-11-24 MED ORDER — HYDRALAZINE HCL 10 MG PO TABS: 10.0000 mg | ORAL_TABLET | Freq: Two times a day (BID) | ORAL | 0 refills | Status: DC

## 2015-11-24 MED ORDER — METOPROLOL TARTRATE 50 MG PO TABS
50.0000 mg | ORAL_TABLET | Freq: Two times a day (BID) | ORAL | 1 refills | Status: DC
Start: 2015-11-24 — End: 2016-07-02

## 2015-11-24 MED ORDER — TELMISARTAN-HCTZ 80-25 MG PO TABS: 1.0000 | ORAL_TABLET | Freq: Every day | ORAL | 1 refills | Status: DC

## 2015-11-24 NOTE — Progress Notes (Signed)
Name: Melody Soto   MRN: [id number]    DOB: November 05, 1950   Date:11/24/2015       Progress Note  Subjective  Chief Complaint  Chief Complaint  Patient presents with  . Medication Refill    6 month F/U  . Hypertension    Patient states she has been drink Beet Juice and helping keep her BP down.  Marland Kitchen Hyperlipidemia    Takes Medication every so often.   . Allergic Rhinitis     HPI  Pruritus: she started to have generalized pruritus, excoriation from the scratching from January until May 2017, however is doing well now, she has been using moisturizer.   HTN: bp is at goal, she forgets to take pm dose of Hydralazine and since bp is towards low end of normal so we will  Decrease dose to 10 mg twice daily and monitor, if still low by next visit we will stop it  Metabolic Syndrome fasting glucose of 133, no polyphagia, polydipsia or polyuria. Last hgbA1C is going up, but still not diabetes  Depression: she does not want to take medications, felt very bad around Christmas when she found out her ex-husband got engaged and she was crying and feeling very upset. Feeling better now, she has a support group ( church friends ). Currently her ex-husband is no longer with fiancee. Ex-husband stayed with her this past weekend to see their daughters and was respectful.  Denies suicidal thoughts and ideation.   Hyperlipidemia: she is taking Pravastatin most days of the week, discussed importance of compliance  Obesity: she is not really trying to lose weight at this time, but weight has been stable  Severe Aortic Stenosis: no symptoms, seen by cardiologist and is on yearly follow up, last echo in 2016   Patient Active Problem List   Diagnosis Date Noted  . Left ventricular hypertrophy 11/27/2014  . Lymphedema of lower extremity 08/23/2014  . Severe aortic stenosis 08/21/2014  . Benign essential HTN 08/21/2014  . Depression, major, recurrent, mild ([place]) 08/21/2014  . Dyslipidemia 08/21/2014   . Cardiac dysrhythmia 08/21/2014  . Dysmetabolic syndrome 0000000  . Extreme obesity ([place]) 08/21/2014  . Symptomatic menopausal or female climacteric states 08/21/2014  . 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 History of shingles 08/21/2014  . Personal history of skin cancer 11/24/2009    Past Surgical History:  Procedure Laterality Date  . cataract surgery Bilateral 05/24/2014   second eye 06/07/2014  . MELANOMA EXCISION    . OVARIAN CYST REMOVAL     EXPLORATORY LAPAROTOMY  . TONSILLECTOMY      Family History  Problem Relation Age of Onset  . Cancer Mother   . Stroke Mother   . Diabetes Sister   . Hypertension Sister   . Breast cancer Maternal Aunt     Social History   Social History  . Marital status: Divorced    Spouse name: N/A  . Number of children: N/A  . Years of education: N/A   Occupational History  . Not on file.   Social History Main Topics  . Smoking status: Never Smoker  . Smokeless tobacco: Never Used  . Alcohol use 0.0 oz/week     Comment: Minimal alcohol consumption WINE   . Drug use: No  . Sexual activity: No   Other Topics Concern  . Not on file   Social History Narrative  . No narrative on file     Current Outpatient Prescriptions:  .  Ascorbic Acid 100  MG LPOP, Take by mouth. AM, Disp: , Rfl:  .  aspirin 81 MG chewable tablet, Chew by mouth. PM, Disp: , Rfl:  .  B Complex-C (B-COMPLEX WITH VITAMIN C) tablet, Take 1 tablet by mouth daily. AM, Disp: , Rfl:  .  Chromium-Cinnamon (CINNAMON PLUS CHROMIUM) 50-500 MCG-MG CAPS, Take by mouth. AM, Disp: , Rfl:  .  Coenzyme Q10 (COQ10) 50 MG CAPS, Take 1 tablet by mouth. , Disp: , Rfl:  .  hydrALAZINE (APRESOLINE) 25 MG tablet, Take 1 tablet (25 mg total) by mouth 2 (two) times daily. AM AND PM, Disp: 180 tablet, Rfl: 1 .  Magnesium 100 MG CAPS, Take by mouth. PM, Disp: , Rfl:  .  metoprolol (LOPRESSOR) 50 MG tablet, Take 1 tablet (50 mg total) by mouth 2 (two) times daily.,  Disp: 180 tablet, Rfl: 1 .  MULTIPLE VITAMIN PO, Reported on 05/19/2015, Disp: , Rfl:  .  Omega-3 Fatty Acids (FISH OIL CONCENTRATE) 300 MG CAPS, Take by mouth., Disp: , Rfl:  .  pravastatin (PRAVACHOL) 20 MG tablet, Take 1 tablet (20 mg total) by mouth daily., Disp: 90 tablet, Rfl: 1 .  telmisartan-hydrochlorothiazide (MICARDIS HCT) 80-25 MG tablet, Take 1 tablet by mouth daily. It must be in blister packs - has a rash with the regular tablets, Disp: 90 tablet, Rfl: 1 .  vitamin B-12 (CYANOCOBALAMIN) 1000 MCG tablet, Take 1,000 mcg by mouth 2 (two) times daily., Disp: , Rfl:   Allergies  Allergen Reactions  . Ace Inhibitors Other (See Comments)    UNKNOWN REACTION     ROS  Constitutional: Negative for fever or weight change.  Respiratory: Negative for cough and shortness of breath.   Cardiovascular: Negative for chest pain or palpitations.  Gastrointestinal: Negative for abdominal pain, no bowel changes.  Musculoskeletal: Negative for gait problem or joint swelling.  Skin: Negative for rash.  Neurological: Negative for dizziness or headache.  No other specific complaints in a complete review of systems (except as listed in HPI above).  Objective  Vitals:   11/24/15 1100  BP: 116/62  Pulse: 76  Resp: 16  Temp: 98.6 F (37 C)  TempSrc: Oral  SpO2: 94%  Weight: 286 lb 6.4 oz (129.9 kg)  Height: 5\' 9"  (1.753 m)    Body mass index is 42.29 kg/m.  Physical Exam  Constitutional: Patient appears well-developed and well-nourished. Obese  No distress.  HEENT: head atraumatic, normocephalic, pupils equal and reactive to light, neck supple, throat within normal limits Cardiovascular: Normal rate, regular rhythm and normal heart sounds.  Systolic murmur. BLE non pitting edema. Pulmonary/Chest: Effort normal and breath sounds normal. No respiratory distress. Abdominal: Soft.  There is no tenderness. Psychiatric: Patient has a normal mood and affect. behavior is normal. Judgment and  thought content normal.  Recent Results (from the past 2160 hour(s))  CBC with Differential/Platelet     Status: None   Collection Time: 11/23/15 10:04 AM  Result Value Ref Range   WBC 7.3 3.4 - 10.8 x10E3/uL   RBC 4.15 3.77 - 5.28 x10E6/uL   Hemoglobin 13.6 11.1 - 15.9 g/dL   Hematocrit 40.2 34.0 - 46.6 %   MCV 97 79 - 97 fL   MCH 32.8 26.6 - 33.0 pg   MCHC 33.8 31.5 - 35.7 g/dL   RDW 14.0 12.3 - 15.4 %   Platelets 307 150 - 379 x10E3/uL   Neutrophils 63 Not Estab. %   Lymphs 25 Not Estab. %   Monocytes 8 Not  Estab. %   Eos 3 Not Estab. %   Basos 1 Not Estab. %   Neutrophils Absolute 4.7 1.4 - 7.0 x10E3/uL   Lymphocytes Absolute 1.8 0.7 - 3.1 x10E3/uL   Monocytes Absolute 0.6 0.1 - 0.9 x10E3/uL   EOS (ABSOLUTE) 0.2 0.0 - 0.4 x10E3/uL   Basophils Absolute 0.1 0.0 - 0.2 x10E3/uL   Immature Granulocytes 0 Not Estab. %   Immature Grans (Abs) 0.0 0.0 - 0.1 x10E3/uL  Hemoglobin A1c     Status: Abnormal   Collection Time: 11/23/15 10:04 AM  Result Value Ref Range   Hgb A1c MFr Bld 6.0 (H) 4.8 - 5.6 %    Comment:          Pre-diabetes: 5.7 - 6.4          Diabetes: >6.4          Glycemic control for adults with diabetes: <7.0    Est. average glucose Bld gHb Est-mCnc 126 mg/dL  TSH     Status: None   Collection Time: 11/23/15 10:04 AM  Result Value Ref Range   TSH 0.885 0.450 - 4.500 uIU/mL      PHQ2/9: Depression screen [facility] 2/9 02/24/2015 08/23/2014  Decreased Interest 0 0  Down, Depressed, Hopeless 0 0  PHQ - 2 Score 0 0     Fall Risk: Fall Risk  02/24/2015 08/23/2014  Falls in the past year? No No      Assessment & Plan   1. Benign essential HTN  - hydrALAZINE (APRESOLINE) 10 MG tablet; Take 1 tablet (10 mg total) by mouth 2 (two) times daily. AM AND PM  Dispense: 180 tablet; Refill: 0 - metoprolol (LOPRESSOR) 50 MG tablet; Take 1 tablet (50 mg total) by mouth 2 (two) times daily.  Dispense: 180 tablet; Refill: 1 - telmisartan-hydrochlorothiazide (MICARDIS HCT)  80-25 MG tablet; Take 1 tablet by mouth daily. It must be in blister packs - has a rash with the regular tablets  Dispense: 90 tablet; Refill: 1  2. Depression, major, recurrent, mild ([place])  Doing well at this time  3. Dysmetabolic syndrome  Discussed life style modification   4. Severe aortic stenosis  Continue follow up yearly with cardiologist  5. Dyslipidemia  - pravastatin (PRAVACHOL) 20 MG tablet; Take 1 tablet (20 mg total) by mouth daily.  Dispense: 90 tablet; Refill: 1  6. Needs flu shot  - Flu Vaccine QUAD 36+ mos IM  7. Lymphedema of both lower extremities  Discussed losing weight   8. Extreme obesity ([place])  Discussed with the patient the risk posed by an increased BMI. Discussed importance of portion control, calorie counting and at least 150 minutes of physical activity weekly. Avoid sweet beverages and drink more water. Eat at least 6 servings of fruit and vegetables daily   9. Need for shingles vaccine  - Varicella-zoster vaccine subcutaneous

## 2015-11-25 ENCOUNTER — Encounter: Payer: Self-pay | Admitting: Family Medicine

## 2016-02-13 ENCOUNTER — Other Ambulatory Visit: Payer: Self-pay | Admitting: Family Medicine

## 2016-02-13 DIAGNOSIS — I10 Essential (primary) hypertension: Secondary | ICD-10-CM

## 2016-03-21 ENCOUNTER — Other Ambulatory Visit: Payer: Self-pay | Admitting: Family Medicine

## 2016-03-21 DIAGNOSIS — I10 Essential (primary) hypertension: Secondary | ICD-10-CM

## 2016-05-25 ENCOUNTER — Ambulatory Visit: Payer: BLUE CROSS/BLUE SHIELD | Admitting: Family Medicine

## 2016-06-18 ENCOUNTER — Ambulatory Visit: Payer: BLUE CROSS/BLUE SHIELD | Admitting: Family Medicine

## 2016-06-25 ENCOUNTER — Ambulatory Visit: Payer: BLUE CROSS/BLUE SHIELD | Admitting: Family Medicine

## 2016-07-02 ENCOUNTER — Ambulatory Visit (INDEPENDENT_AMBULATORY_CARE_PROVIDER_SITE_OTHER): Payer: BLUE CROSS/BLUE SHIELD | Admitting: Family Medicine

## 2016-07-02 ENCOUNTER — Encounter: Payer: Self-pay | Admitting: Family Medicine

## 2016-07-02 VITALS — BP 110/68 | HR 73 | Temp 98.9°F | Resp 16 | Ht 69.0 in | Wt 288.4 lb

## 2016-07-02 DIAGNOSIS — I10 Essential (primary) hypertension: Secondary | ICD-10-CM | POA: Diagnosis not present

## 2016-07-02 DIAGNOSIS — E8881 Metabolic syndrome: Secondary | ICD-10-CM

## 2016-07-02 DIAGNOSIS — E785 Hyperlipidemia, unspecified: Secondary | ICD-10-CM | POA: Diagnosis not present

## 2016-07-02 DIAGNOSIS — F330 Major depressive disorder, recurrent, mild: Secondary | ICD-10-CM | POA: Diagnosis not present

## 2016-07-02 DIAGNOSIS — I350 Nonrheumatic aortic (valve) stenosis: Secondary | ICD-10-CM | POA: Diagnosis not present

## 2016-07-02 MED ORDER — METOPROLOL TARTRATE 50 MG PO TABS: 50.0000 mg | ORAL_TABLET | Freq: Two times a day (BID) | ORAL | 1 refills | Status: DC

## 2016-07-02 MED ORDER — SEMAGLUTIDE(0.25 OR 0.5MG/DOS) 2 MG/1.5ML ~~LOC~~ SOPN: 0.5000 mg | PEN_INJECTOR | SUBCUTANEOUS | 2 refills | Status: DC

## 2016-07-02 MED ORDER — DULOXETINE HCL 30 MG PO CPEP: 30.0000 mg | ORAL_CAPSULE | Freq: Every day | ORAL | 0 refills | Status: DC

## 2016-07-02 MED ORDER — TELMISARTAN-HCTZ 80-25 MG PO TABS: 1.0000 | ORAL_TABLET | Freq: Every day | ORAL | 1 refills | Status: DC

## 2016-07-02 MED ORDER — PRAVASTATIN SODIUM 20 MG PO TABS: 20.0000 mg | ORAL_TABLET | Freq: Every day | ORAL | 1 refills | Status: DC

## 2016-07-02 NOTE — Progress Notes (Signed)
Name: Melody Soto   MRN: [id number]    DOB: 1951/01/29   Date:07/02/2016       Progress Note  Subjective  Chief Complaint  Chief Complaint  Patient presents with  . Hypertension    6 month follow up  . Depression  . Obesity  . Hyperlipidemia    HPI  HTN: bp is low, advised to stop Hydralazine, and continue other medications. No chest pain or palpitation  Metabolic Syndrome: she denies polyphagia, polydipsia or polyuria, last hgbA1C was at 6.0% back 11/2015. She is willing to try medication.   Depression: she took Celexa many years ago, she has a long history of depression ( worse when was separating from her husband), she still has crying spells and sadness that can last 3 days. She states Celexa made her too numb, she would like to try something milder. She has a good support system. She states significant family history of depression.   Hyperlipidemia: she is taking Pravastatin most days of the week, discussed importance of compliance, she denies myalgias.   Obesity: she is not really trying to lose weight at this time, trying to team up with her friend Jacqlyn Larsen.   Severe Aortic Stenosis: no symptoms, seen by cardiologist and is on yearly follow up, last echo in 2016, denies syncope, or chest pain    Patient Active Problem List   Diagnosis Date Noted  . Left ventricular hypertrophy 11/27/2014  . Lymphedema of lower extremity 08/23/2014  . Severe aortic stenosis 08/21/2014  . Benign essential HTN 08/21/2014  . Depression, major, recurrent, mild ([place]) 08/21/2014  . Dyslipidemia 08/21/2014  . Cardiac dysrhythmia 08/21/2014  . Dysmetabolic syndrome 22/63/3354  . Extreme obesity 08/21/2014  . Symptomatic menopausal or female climacteric states 08/21/2014  . 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 History of shingles 08/21/2014  . Personal history of skin cancer 11/24/2009    Past Surgical History:  Procedure Laterality Date  . cataract surgery Bilateral  05/24/2014   second eye 06/07/2014  . MELANOMA EXCISION    . OVARIAN CYST REMOVAL     EXPLORATORY LAPAROTOMY  . TONSILLECTOMY      Family History  Problem Relation Age of Onset  . Cancer Mother   . Stroke Mother   . Diabetes Sister   . Hypertension Sister   . Breast cancer Maternal Aunt     Social History   Social History  . Marital status: Divorced    Spouse name: N/A  . Number of children: N/A  . Years of education: N/A   Occupational History  . Not on file.   Social History Main Topics  . Smoking status: Never Smoker  . Smokeless tobacco: Never Used  . Alcohol use 0.0 oz/week     Comment: Minimal alcohol consumption WINE   . Drug use: No  . Sexual activity: No   Other Topics Concern  . Not on file   Social History Narrative  . No narrative on file     Current Outpatient Prescriptions:  .  Ascorbic Acid 100 MG LPOP, Take by mouth. AM, Disp: , Rfl:  .  aspirin 81 MG chewable tablet, Chew by mouth. PM, Disp: , Rfl:  .  B Complex-C (B-COMPLEX WITH VITAMIN C) tablet, Take 1 tablet by mouth daily. AM, Disp: , Rfl:  .  Coenzyme Q10 (COQ10) 50 MG CAPS, Take 1 tablet by mouth. , Disp: , Rfl:  .  Magnesium 100 MG CAPS, Take by mouth. PM, Disp: , Rfl:  .  metoprolol tartrate (LOPRESSOR) 50 MG tablet, Take 1 tablet (50 mg total) by mouth 2 (two) times daily., Disp: 180 tablet, Rfl: 1 .  MULTIPLE VITAMIN PO, Reported on 05/19/2015, Disp: , Rfl:  .  Omega-3 Fatty Acids (FISH OIL CONCENTRATE) 300 MG CAPS, Take by mouth., Disp: , Rfl:  .  pravastatin (PRAVACHOL) 20 MG tablet, Take 1 tablet (20 mg total) by mouth daily., Disp: 90 tablet, Rfl: 1 .  Semaglutide (OZEMPIC) 0.25 or 0.5 MG/DOSE SOPN, Inject 0.5 mg into the skin once a week., Disp: 3 mL, Rfl: 2 .  telmisartan-hydrochlorothiazide (MICARDIS HCT) 80-25 MG tablet, Take 1 tablet by mouth daily., Disp: 90 tablet, Rfl: 1 .  vitamin B-12 (CYANOCOBALAMIN) 1000 MCG tablet, Take 1,000 mcg by mouth 2 (two) times daily., Disp: ,  Rfl:   Allergies  Allergen Reactions  . Ace Inhibitors Other (See Comments)    UNKNOWN REACTION     ROS  Constitutional: Negative for fever or weight change.  Respiratory: Negative for cough and shortness of breath.   Cardiovascular: Negative for chest pain or palpitations.  Gastrointestinal: Negative for abdominal pain, no bowel changes.  Musculoskeletal: Negative for gait problem or joint swelling.  Skin: Negative for rash.  Neurological: Negative for dizziness or headache.  No other specific complaints in a complete review of systems (except as listed in HPI above).  Objective  Vitals:   07/02/16 1013  BP: 110/68  Pulse: 73  Resp: 16  Temp: 98.9 F (37.2 C)  SpO2: 91%  Weight: 288 lb 6 oz (130.8 kg)  Height: 5\' 9"  (1.753 m)    Body mass index is 42.59 kg/m.  Physical Exam  Constitutional: Patient appears well-developed and well-nourished. Obese  No distress.  HEENT: head atraumatic, normocephalic, pupils equal and reactive to light,  neck supple, throat within normal limits Cardiovascular: Normal rate, regular rhythm harsh systolic ejection murmur, worse on right 2nd intercostal space  BLE edema - non-pitting Pulmonary/Chest: Effort normal and breath sounds normal. No respiratory distress. Abdominal: Soft.  There is no tenderness. Psychiatric: Patient has a normal mood and affect. behavior is normal. Judgment and thought content normal.  PHQ2/9: Depression screen [facility] 2/9 02/24/2015 08/23/2014  Decreased Interest 0 0  Down, Depressed, Hopeless 0 0  PHQ - 2 Score 0 0    Fall Risk: Fall Risk  02/24/2015 08/23/2014  Falls in the past year? No No    Assessment & Plan  1. Benign essential HTN  - telmisartan-hydrochlorothiazide (MICARDIS HCT) 80-25 MG tablet; Take 1 tablet by mouth daily.  Dispense: 90 tablet; Refill: 1 - metoprolol tartrate (LOPRESSOR) 50 MG tablet; Take 1 tablet (50 mg total) by mouth 2 (two) times daily.  Dispense: 180 tablet; Refill: 1 -  COMPLETE METABOLIC PANEL WITH GFR  2. Depression, major, recurrent, mild ([place])  Not on medications, she does not want medications at this time  3. Severe aortic stenosis  Continue yearly follow up with cardiologist   4. Dysmetabolic syndrome  - Semaglutide (OZEMPIC) 0.25 or 0.5 MG/DOSE SOPN; Inject 0.5 mg into the skin once a week.  Dispense: 3 mL; Refill: 2 - Hemoglobin A1c - Insulin, fasting  5. Dyslipidemia  - pravastatin (PRAVACHOL) 20 MG tablet; Take 1 tablet (20 mg total) by mouth daily.  Dispense: 90 tablet; Refill: 1 - Lipid panel  6. Morbid obesity, unspecified obesity type ([place])  She denies personal history of pancreatitis or family history of thyroid cancer - Semaglutide (OZEMPIC) 0.25 or 0.5 MG/DOSE SOPN; Inject 0.5 mg  into the skin once a week.  Dispense: 3 mL; Refill: 2 - Hemoglobin A1c - Insulin, fasting

## 2016-07-02 NOTE — Patient Instructions (Signed)
Return Wednesday for labs inside of [facility] and also to get sample of GLP-1 agonist ( medication that will help with weight loss ) and injection - no co-pay

## 2016-07-05 LAB — COMPLETE METABOLIC PANEL WITH GFR
ALBUMIN: 4.1 g/dL (ref 3.6–5.1)
ALT: 25 U/L (ref 6–29)
AST: 19 U/L (ref 10–35)
Alkaline Phosphatase: 65 U/L (ref 33–130)
BILIRUBIN TOTAL: 0.3 mg/dL (ref 0.2–1.2)
BUN: 26 mg/dL — AB (ref 7–25)
CALCIUM: 9.5 mg/dL (ref 8.6–10.4)
CO2: 28 mmol/L (ref 20–31)
CREATININE: 0.88 mg/dL (ref 0.50–0.99)
Chloride: 104 mmol/L (ref 98–110)
GFR, Est African American: 80 mL/min (ref >=60)
GFR, Est Non African American: 70 mL/min (ref >=60)
GLUCOSE: 125 mg/dL — AB (ref 65–99)
Potassium: 4.7 mmol/L (ref 3.5–5.3)
SODIUM: 137 mmol/L (ref 135–146)
TOTAL PROTEIN: 6.7 g/dL (ref 6.1–8.1)

## 2016-07-05 LAB — INSULIN, FASTING: Insulin fasting, serum: 39.7 u[IU]/mL — ABNORMAL HIGH (ref 2.0–19.6)

## 2016-07-05 LAB — LIPID PANEL
CHOL/HDL RATIO: 4 ratio (ref <5.0)
CHOLESTEROL: 194 mg/dL (ref <200)
HDL: 49 mg/dL — ABNORMAL LOW (ref >50)
LDL Cholesterol: 112 mg/dL — ABNORMAL HIGH (ref <100)
TRIGLYCERIDES: 163 mg/dL — AB (ref <150)
VLDL: 33 mg/dL — ABNORMAL HIGH (ref <30)

## 2016-07-05 LAB — HEMOGLOBIN A1C
HEMOGLOBIN A1C: 5.8 % — AB (ref <5.7)
Mean Plasma Glucose: 120 mg/dL

## 2016-08-09 ENCOUNTER — Encounter: Payer: Self-pay | Admitting: Family Medicine

## 2016-08-10 ENCOUNTER — Other Ambulatory Visit: Payer: Self-pay | Admitting: Family Medicine

## 2016-08-13 ENCOUNTER — Encounter: Payer: Self-pay | Admitting: Family Medicine

## 2016-08-13 ENCOUNTER — Ambulatory Visit (INDEPENDENT_AMBULATORY_CARE_PROVIDER_SITE_OTHER): Payer: BLUE CROSS/BLUE SHIELD | Admitting: Family Medicine

## 2016-08-13 VITALS — BP 100/68 | HR 80 | Temp 97.5°F | Resp 18 | Ht 69.0 in | Wt 286.4 lb

## 2016-08-13 DIAGNOSIS — E8881 Metabolic syndrome: Secondary | ICD-10-CM | POA: Diagnosis not present

## 2016-08-13 DIAGNOSIS — E668 Other obesity: Secondary | ICD-10-CM | POA: Diagnosis not present

## 2016-08-13 DIAGNOSIS — F330 Major depressive disorder, recurrent, mild: Secondary | ICD-10-CM

## 2016-08-13 MED ORDER — SEMAGLUTIDE (1 MG/DOSE) 2 MG/1.5ML ~~LOC~~ SOPN: 1.0000 mg | PEN_INJECTOR | SUBCUTANEOUS | 2 refills | Status: DC

## 2016-08-13 NOTE — Progress Notes (Addendum)
Name: Melody Soto   MRN: [id number]    DOB: February 17, 1950   Date:08/13/2016       Progress Note  Subjective  Chief Complaint  Chief Complaint  Patient presents with  . Obesity    6 week follow up pt stated that she has had some nausea and diarrhea with ozempic but it has curbed her apetite                                        . Depression    pt could not tolerate cymbalta    HPI  Metabolic Syndrome: she denies polyphagia, polydipsia or polyuria, last hgbA1C was at 6.0% back 11/2015. Fasting insulin was above 29 and we started her on Ozempic samples, she is very happy with results. Curbing her appetite, she is more energetic, and states only side effects is lose stools some mornings.   Depression: she took Celexa many years ago, she has a long history of depression ( worse when was separating from her husband), she still has crying spells and sadness that can last 3 days. She states Celexa made her too numb, she tried Cymbalta but after a few weeks it made her feel very tired and increase anhedonia. She states she has a good support group and does not want to take medication.   Obesity: she is on Ozempic for pre-diabetes, has only lost 2 lbs since started, but states she feels much better, more energy, less edema, she is worried about insurance coverage. She states she uses food to cope with stress/depression.    Patient Active Problem List   Diagnosis Date Noted  . Left ventricular hypertrophy 11/27/2014  . Lymphedema of lower extremity 08/23/2014  . Severe aortic stenosis 08/21/2014  . Benign essential HTN 08/21/2014  . Depression, major, recurrent, mild ([place]) 08/21/2014  . Dyslipidemia 08/21/2014  . Cardiac dysrhythmia 08/21/2014  . Dysmetabolic syndrome 24/26/8341  . Extreme obesity 08/21/2014  . Symptomatic menopausal or female climacteric states 08/21/2014  . 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 History of shingles 08/21/2014  . Personal history of skin  cancer 11/24/2009    Past Surgical History:  Procedure Laterality Date  . cataract surgery Bilateral 05/24/2014   second eye 06/07/2014  . MELANOMA EXCISION    . OVARIAN CYST REMOVAL     EXPLORATORY LAPAROTOMY  . TONSILLECTOMY      Family History  Problem Relation Age of Onset  . Cancer Mother   . Stroke Mother   . Diabetes Sister   . Hypertension Sister   . Breast cancer Maternal Aunt     Social History   Social History  . Marital status: Divorced    Spouse name: N/A  . Number of children: N/A  . Years of education: N/A   Occupational History  . Not on file.   Social History Main Topics  . Smoking status: Never Smoker  . Smokeless tobacco: Never Used  . Alcohol use 0.0 oz/week     Comment: Minimal alcohol consumption WINE   . Drug use: No  . Sexual activity: No   Other Topics Concern  . Not on file   Social History Narrative  . No narrative on file     Current Outpatient Prescriptions:  .  Ascorbic Acid 100 MG LPOP, Take by mouth. AM, Disp: , Rfl:  .  aspirin 81 MG chewable tablet, Chew by mouth. PM,  Disp: , Rfl:  .  B Complex-C (B-COMPLEX WITH VITAMIN C) tablet, Take 1 tablet by mouth daily. AM, Disp: , Rfl:  .  Coenzyme Q10 (COQ10) 50 MG CAPS, Take 1 tablet by mouth. , Disp: , Rfl:  .  Magnesium 100 MG CAPS, Take by mouth. PM, Disp: , Rfl:  .  metoprolol tartrate (LOPRESSOR) 50 MG tablet, Take 1 tablet (50 mg total) by mouth 2 (two) times daily., Disp: 180 tablet, Rfl: 1 .  MULTIPLE VITAMIN PO, Reported on 05/19/2015, Disp: , Rfl:  .  Omega-3 Fatty Acids (FISH OIL CONCENTRATE) 300 MG CAPS, Take by mouth., Disp: , Rfl:  .  pravastatin (PRAVACHOL) 20 MG tablet, Take 1 tablet (20 mg total) by mouth daily., Disp: 90 tablet, Rfl: 1 .  Semaglutide (OZEMPIC) 1 MG/DOSE SOPN, Inject 1 mg into the skin once a week., Disp: 3 mL, Rfl: 2 .  telmisartan-hydrochlorothiazide (MICARDIS HCT) 80-25 MG tablet, Take 1 tablet by mouth daily., Disp: 90 tablet, Rfl: 1 .  vitamin  B-12 (CYANOCOBALAMIN) 1000 MCG tablet, Take 1,000 mcg by mouth 2 (two) times daily., Disp: , Rfl:   Allergies  Allergen Reactions  . Ace Inhibitors Other (See Comments)    UNKNOWN REACTION  . Duloxetine     drowsiness     ROS  Constitutional: Negative for fever , negative for weight change.  Respiratory: Negative for cough and shortness of breath.   Cardiovascular: Negative for chest pain or palpitations.  Gastrointestinal: Negative for abdominal pain, no bowel changes.  Musculoskeletal: Negative for gait problem or joint swelling.  Skin: Negative for rash.  Neurological: Negative for dizziness or headache.  No other specific complaints in a complete review of systems (except as listed in HPI above).  Objective  Vitals:   08/13/16 0931  BP: 100/68  Pulse: 80  Resp: 18  Temp: 97.5 F (36.4 C)  SpO2: 97%  Weight: 286 lb 6 oz (129.9 kg)  Height: 5\' 9"  (1.753 m)    Body mass index is 42.29 kg/m.  Physical Exam  Constitutional: Patient appears well-developed and well-nourished. Obese  No distress.  HEENT: head atraumatic, normocephalic, pupils equal and reactive to light, neck supple, throat within normal limits Cardiovascular: Normal rate, regular rhythm and normal heart sounds.  Systolic ejection murmur 3/6 2nd intercostal space right side.  BLE edema - non- pitting, lymphedema Pulmonary/Chest: Effort normal and breath sounds normal. No respiratory distress. Abdominal: Soft.  There is no tenderness. Psychiatric: Patient has a normal mood and affect. behavior is normal. Judgment and thought content normal.  Recent Results (from the past 2160 hour(s))  COMPLETE METABOLIC PANEL WITH GFR     Status: Abnormal   Collection Time: 07/02/16 10:42 AM  Result Value Ref Range   Sodium 137 135 - 146 mmol/L   Potassium 4.7 3.5 - 5.3 mmol/L   Chloride 104 98 - 110 mmol/L   CO2 28 20 - 31 mmol/L   Glucose, Bld 125 (H) 65 - 99 mg/dL   BUN 26 (H) 7 - 25 mg/dL   Creat 0.88 0.50 -  0.99 mg/dL    Comment:   For patients > or = 65 years of age: The upper reference limit for Creatinine is approximately 13% higher for people identified as African-American.      Total Bilirubin 0.3 0.2 - 1.2 mg/dL   Alkaline Phosphatase 65 33 - 130 U/L   AST 19 10 - 35 U/L   ALT 25 6 - 29 U/L   Total  Protein 6.7 6.1 - 8.1 g/dL   Albumin 4.1 3.6 - 5.1 g/dL   Calcium 9.5 8.6 - 10.4 mg/dL   GFR, Est African American 80 >=60 mL/min   GFR, Est Non African American 70 >=60 mL/min  Hemoglobin A1c     Status: Abnormal   Collection Time: 07/02/16 10:42 AM  Result Value Ref Range   Hgb A1c MFr Bld 5.8 (H) <5.7 %    Comment:   For someone without known diabetes, a hemoglobin A1c value between 5.7% and 6.4% is consistent with prediabetes and should be confirmed with a follow-up test.   For someone with known diabetes, a value <7% indicates that their diabetes is well controlled. A1c targets should be individualized based on duration of diabetes, age, co-morbid conditions and other considerations.   This assay result is consistent with an increased risk of diabetes.   Currently, no consensus exists regarding use of hemoglobin A1c for diagnosis of diabetes in children.      Mean Plasma Glucose 120 mg/dL  Insulin, fasting     Status: Abnormal   Collection Time: 07/02/16 10:42 AM  Result Value Ref Range   Insulin fasting, serum 39.7 (H) 2.0 - 19.6 uIU/mL    Comment:   This insulin assay shows strong cross-reactivity for some insulin analogs (lispro, aspart, and glargine) and much lower cross-reactivity with others (detemir, glulisine).   Stimulated Insulin reference intervals were established using the Siemens Immulite assay. These values are provided for general guidance only.   Lipid panel     Status: Abnormal   Collection Time: 07/02/16 10:42 AM  Result Value Ref Range   Cholesterol 194 <200 mg/dL   Triglycerides 163 (H) <150 mg/dL   HDL 49 (L) >50 mg/dL   Total CHOL/HDL  Ratio 4.0 <5.0 Ratio   VLDL 33 (H) <30 mg/dL   LDL Cholesterol 112 (H) <100 mg/dL     PHQ2/9: Depression screen [facility] 2/9 02/24/2015 08/23/2014  Decreased Interest 0 0  Down, Depressed, Hopeless 0 0  PHQ - 2 Score 0 0     Fall Risk: Fall Risk  02/24/2015 08/23/2014  Falls in the past year? No No     Assessment & Plan  1. Dysmetabolic syndrome  - Semaglutide (OZEMPIC) 1 MG/DOSE SOPN; Inject 1 mg into the skin once a week.  Dispense: 3 mL; Refill: 2 Tolerating it well, but insurance denied coverage, we will try sending it to [facility] instead, if not covered we will try another GLP-1 agonist   2. Extreme obesity  - Semaglutide (OZEMPIC) 1 MG/DOSE SOPN; Inject 1 mg into the skin once a week.  Dispense: 3 mL; Refill: 2  3. Depression, major, recurrent, mild ([place])  She could not tolerate Duloxetine, it made her feel tired, no motivation, she is afraid of trying anything else at this time

## 2016-08-13 NOTE — Patient Instructions (Signed)
[facility]:   Mindfulness.  Cognitive Behavior Therapy

## 2016-08-21 ENCOUNTER — Encounter: Payer: Self-pay | Admitting: Family Medicine

## 2016-08-24 ENCOUNTER — Other Ambulatory Visit: Payer: Self-pay | Admitting: Family Medicine

## 2016-08-24 DIAGNOSIS — N950 Postmenopausal bleeding: Secondary | ICD-10-CM

## 2016-08-29 ENCOUNTER — Telehealth: Payer: Self-pay | Admitting: Obstetrics & Gynecology

## 2016-08-29 NOTE — Telephone Encounter (Signed)
Called and left voice mail for patient to call back to be schedule °

## 2016-08-29 NOTE — Telephone Encounter (Signed)
Pt is being rederred by [facility] medical for post menopausal bleeding. Called and left voicemail for patient to call back to be schedule

## 2016-09-04 NOTE — Telephone Encounter (Signed)
Pt is schedule 09/14/16 with Glennon Mac

## 2016-09-14 ENCOUNTER — Encounter: Payer: Self-pay | Admitting: Obstetrics and Gynecology

## 2016-10-17 ENCOUNTER — Encounter: Payer: Self-pay | Admitting: Obstetrics and Gynecology

## 2016-10-17 ENCOUNTER — Ambulatory Visit (INDEPENDENT_AMBULATORY_CARE_PROVIDER_SITE_OTHER): Payer: Medicare HMO | Admitting: Obstetrics and Gynecology

## 2016-10-17 VITALS — BP 132/86 | Ht 69.0 in | Wt 286.0 lb

## 2016-10-17 DIAGNOSIS — N950 Postmenopausal bleeding: Secondary | ICD-10-CM | POA: Diagnosis not present

## 2016-10-17 DIAGNOSIS — N841 Polyp of cervix uteri: Secondary | ICD-10-CM | POA: Diagnosis not present

## 2016-10-17 DIAGNOSIS — C541 Malignant neoplasm of endometrium: Secondary | ICD-10-CM | POA: Diagnosis not present

## 2016-10-17 NOTE — Progress Notes (Addendum)
Obstetrics & Gynecology Office Visit   Chief Complaint  Patient presents with  . abnormal bleeding    PMB  Referral From [facility], Dr. Steele Sizer for postemenopausal bleeding  History of Present Illness: 66 y.o. menopausal female who is seen in referral from Dr. Steele Sizer from [facility] for the above.  She went through menopause at age 2 years.  Menarche at age 28.  She had no bleeding for about 2 years. In June she had brown spotting that was self-limited.  In July she had more spotting.  Last night she had heavy bleeding that contained clots. She denies pain.  She denies bloating, early satiety, weight loss, constipation, diarrhea.  She does not remember the last time she had a pap smear.    Past Medical History:  Diagnosis Date  . Cataract   . Depression   . Heart murmur    HX OF  . Hyperlipidemia   . Hypertension    CONTROLLED ON MEDS  . Shortness of breath dyspnea     Past Surgical History:  Procedure Laterality Date  . cataract surgery Bilateral 05/24/2014   second eye 06/07/2014  . MELANOMA EXCISION    . OVARIAN CYST REMOVAL     EXPLORATORY LAPAROTOMY  . TONSILLECTOMY      Gynecologic History: No LMP recorded. Patient is postmenopausal.  Obstetric History: No obstetric history on file.  Family History  Problem Relation Age of Onset  . Cancer Mother   . Stroke Mother   . Diabetes Sister   . Hypertension Sister   . Breast cancer Maternal Aunt     Social History   Social History  . Marital status: Divorced    Spouse name: N/A  . Number of children: N/A  . Years of education: N/A   Occupational History  . Not on file.   Social History Main Topics  . Smoking status: Never Smoker  . Smokeless tobacco: Never Used  . Alcohol use 0.0 oz/week     Comment: Minimal alcohol consumption WINE   . Drug use: No  . Sexual activity: No   Other Topics Concern  . Not on file   Social History Narrative  . No narrative on  file    Allergies  Allergen Reactions  . Ace Inhibitors Other (See Comments)    UNKNOWN REACTION  . Duloxetine     drowsiness    Prior to Admission medications   Medication Sig Start Date End Date Taking? Authorizing Provider  Ascorbic Acid 100 MG LPOP Take by mouth. AM    [provider]  aspirin 81 MG chewable tablet Chew by mouth. PM    [provider]  B Complex-C (B-COMPLEX WITH VITAMIN C) tablet Take 1 tablet by mouth daily. AM    [provider]  Coenzyme Q10 (COQ10) 50 MG CAPS Take 1 tablet by mouth.     [provider]  Magnesium 100 MG CAPS Take by mouth. PM    [provider]  metoprolol tartrate (LOPRESSOR) 50 MG tablet Take 1 tablet (50 mg total) by mouth 2 (two) times daily. 07/02/16   Steele Sizer, MD  MULTIPLE VITAMIN PO Reported on 05/19/2015 10/10/06   [provider]  Omega-3 Fatty Acids (FISH OIL CONCENTRATE) 300 MG CAPS Take by mouth.    [provider]  pravastatin (PRAVACHOL) 20 MG tablet Take 1 tablet (20 mg total) by mouth daily. 07/02/16   Steele Sizer, MD  Semaglutide (OZEMPIC) 1 MG/DOSE SOPN Inject  1 mg into the skin once a week. 08/13/16   Steele Sizer, MD  telmisartan-hydrochlorothiazide (MICARDIS HCT) 80-25 MG tablet Take 1 tablet by mouth daily. 07/02/16   Steele Sizer, MD  vitamin B-12 (CYANOCOBALAMIN) 1000 MCG tablet Take 1,000 mcg by mouth 2 (two) times daily.    [provider]    Review of Systems  Constitutional: Negative.   HENT: Negative.   Eyes: Negative.   Respiratory: Negative.   Cardiovascular: Positive for leg swelling. Negative for chest pain, palpitations, orthopnea, claudication and PND.  Gastrointestinal: Negative.   Genitourinary: Negative.   Musculoskeletal: Negative.   Skin: Positive for itching (lower abdominal rash) and rash (lower abdomen).  Neurological: Negative.   Psychiatric/Behavioral: Negative.      Physical Exam BP 132/86   Ht 5\' 9"   (1.753 m)   Wt 286 lb (129.7 kg)   BMI 42.23 kg/m  No LMP recorded. Patient is postmenopausal. Physical Exam  Constitutional: She is oriented to person, place, and time. She appears well-developed and well-nourished. No distress.  Genitourinary: Vagina normal. Pelvic exam was performed with patient supine. There is no rash, tenderness or lesion on the right labia. There is no rash, tenderness or lesion on the left labia. Vagina exhibits no lesion. No erythema, tenderness or bleeding in the vagina. No vaginal discharge found. Right adnexum does not display mass, does not display tenderness and does not display fullness. Left adnexum does not display mass, does not display tenderness and does not display fullness.  Cervix exhibits polyp (polyp 7 x 10 mm). Cervix does not exhibit motion tenderness or discharge.   Uterus is mobile. Uterus is not enlarged, tender or exhibiting a mass.  Genitourinary Comments: Bimanual exam very limited by patient's body habitus  Eyes: EOM are normal. No scleral icterus.  Neck: Normal range of motion. Neck supple.  Cardiovascular: Normal rate.   Murmur heard. Irregularly irregular rhythm   Pulmonary/Chest: Effort normal and breath sounds normal. No respiratory distress. She has no wheezes. She has no rales.  Abdominal: Soft. Bowel sounds are normal. She exhibits no distension and no mass. There is no tenderness. There is no rebound and no guarding.  Exam limited by body habitus  Musculoskeletal: Normal range of motion. She exhibits no edema.  Neurological: She is alert and oriented to person, place, and time. No cranial nerve deficit.  Skin: Skin is warm and dry. No erythema.  Psychiatric: She has a normal mood and affect. Her behavior is normal. Judgment normal.   Procedure Polypectomy: After informed consent obtained, the ring forceps were used to grasp the polyp (0.7 cm x 1 cm). With a twisting, rotational motion the polyp was removed. The patient tolerated the  procedure well.    Endometrial Biopsy After discussion with the patient regarding her postmenopausal bleeding I recommended that she proceed with an endometrial biopsy for further diagnosis. The risks, benefits, alternatives, and indications for an endometrial biopsy were discussed with the patient in detail. She understood the risks including infection, bleeding, cervical laceration and uterine perforation.  Verbal consent was obtained.   PROCEDURE NOTE:  [place] endometrial biopsy was performed using aseptic technique with iodine preparation.  The uterus was sounded to a length of 7 cm.  Adequate sampling was obtained with minimal blood loss.  The patient tolerated the procedure well.  Disposition will be pending pathology.  Female chaperone present for pelvic and breast  portions of the physical exam  Assessment: 66 y.o. female with postmenopausal bleeding, endocervical polyp.  Plan: Problem List Items Addressed This Visit    None    Visit Diagnoses    Postmenopausal bleeding    -  Primary   Relevant Orders   US PELVIS TRANSVANGINAL NON-OB (TV ONLY)   Pap IG (Image Guided)   Pathology   Cervical polyp       Relevant Orders   Pathology   Pap IG (Image Guided)     Discussed possible etiologies of postmenopausal bleeding. It is a bit unusual for her to go through the menopausal transition at the age of 24 years.  However, discussed atrophy, polyps (and her cervical polyp could be the main reason behind her bleeding), endometrial cancer, etc.  Given her severe aortic stenosis, I attempted to remove the polyp in clinic without much success.  If the polyp appears to be limited to the endocervix and near the external cervical os, will attempt to remove it at her follow up after her pelvic ultrasound.    Prentice Docker, MD 10/17/2016 12:58 PM    CC: Steele Sizer, MD [address] [place], [place] [address]

## 2016-10-18 NOTE — Addendum Note (Signed)
Addended by: Prentice Docker D on: 10/18/2016 12:57 PM   Modules accepted: Level of Service

## 2016-10-19 LAB — PAP IG (IMAGE GUIDED): PAP SMEAR COMMENT: 0

## 2016-10-19 LAB — PATHOLOGY

## 2016-10-29 ENCOUNTER — Encounter: Payer: Self-pay | Admitting: Obstetrics and Gynecology

## 2016-10-29 ENCOUNTER — Ambulatory Visit (INDEPENDENT_AMBULATORY_CARE_PROVIDER_SITE_OTHER): Payer: Medicare HMO

## 2016-10-29 ENCOUNTER — Ambulatory Visit (INDEPENDENT_AMBULATORY_CARE_PROVIDER_SITE_OTHER): Payer: Medicare HMO | Admitting: Obstetrics and Gynecology

## 2016-10-29 VITALS — BP 138/88 | Ht 69.0 in

## 2016-10-29 DIAGNOSIS — N950 Postmenopausal bleeding: Secondary | ICD-10-CM

## 2016-10-29 DIAGNOSIS — C541 Malignant neoplasm of endometrium: Secondary | ICD-10-CM | POA: Insufficient documentation

## 2016-10-29 HISTORY — DX: Malignant neoplasm of endometrium: C54.1

## 2016-10-29 NOTE — Progress Notes (Signed)
Gynecology Ultrasound Follow Up   Chief Complaint  Patient presents with  . Follow-up  Follow up postmenopausal bleeding   History of Present Illness: Patient is a 66 y.o. female who presents today for ultrasound evaluation of postmenopausal bleeding.  Uterus is retroverted (states is anteverted below)      Endometrial biopsy and polypectomy Pathology: Diagnosis:  Part A: ENDOMETRIUM, BIOPSY:  WELL DIFFERENTIATED ENDOMETRIAL ADENOCARCINOMA (FIGO I). COMMENT:  THIS CASE WAS REVIEWED IN INTRADEPARTMENTAL CONSULTATION AND THE  DIAGNOSIS REFLECTS OUR CONSENSUS OPINION.DR,Zlatan Hornback'S OFFICE WAS INFORMED  OF DIAGNOSIS ON 10/19/16 AT 1.10 PM.  Part B: ENDOCERVIX, POLYP:  MINUTE FRAGMENTS OFOF ADENOCARCINOMA PROBABLY FROM ENDOMETRIUM.  MINUTE FRAGMENTS OF BENIGN ENDOCERVICAL GLANDS, MUCUS, AND BLOOD.    Past Medical History:  Diagnosis Date  . Cataract   . Depression   . Endometrial cancer determined by uterine biopsy ([place]) 10/29/2016  . Heart murmur    HX OF  . Hyperlipidemia   . Hypertension    CONTROLLED ON MEDS  . Shortness of breath dyspnea     Past Surgical History:  Procedure Laterality Date  . cataract surgery Bilateral 05/24/2014   second eye 06/07/2014  . MELANOMA EXCISION    . OVARIAN CYST REMOVAL     EXPLORATORY LAPAROTOMY  . TONSILLECTOMY      Family History  Problem Relation Age of Onset  . Cancer Mother   . Stroke Mother   . Diabetes Sister   . Hypertension Sister   . Breast cancer Maternal Aunt     Social History   Social History  . Marital status: Divorced    Spouse name: N/A  . Number of children: N/A  . Years of education: N/A   Occupational History  . Not on file.   Social History Main Topics  . Smoking status: Never Smoker  . Smokeless tobacco: Never Used  . Alcohol use 0.0 oz/week     Comment: Minimal alcohol consumption WINE   . Drug use: No  . Sexual activity: No   Other Topics Concern  . Not on file   Social History  Narrative  . No narrative on file    Allergies  Allergen Reactions  . Ace Inhibitors Other (See Comments)    UNKNOWN REACTION  . Duloxetine     drowsiness    Prior to Admission medications   Medication Sig Start Date End Date Taking? Authorizing Provider  Ascorbic Acid 100 MG LPOP Take by mouth. AM    [provider]  aspirin 81 MG chewable tablet Chew by mouth. PM    [provider]  B Complex-C (B-COMPLEX WITH VITAMIN C) tablet Take 1 tablet by mouth daily. AM    [provider]  Coenzyme Q10 (COQ10) 50 MG CAPS Take 1 tablet by mouth.     [provider]  Magnesium 100 MG CAPS Take by mouth. PM    [provider]  metoprolol tartrate (LOPRESSOR) 50 MG tablet Take 1 tablet (50 mg total) by mouth 2 (two) times daily. 07/02/16   Steele Sizer, MD  MULTIPLE VITAMIN PO Reported on 05/19/2015 10/10/06   [provider]  Omega-3 Fatty Acids (FISH OIL CONCENTRATE) 300 MG CAPS Take by mouth.    [provider]  pravastatin (PRAVACHOL) 20 MG tablet Take 1 tablet (20 mg total) by mouth daily. 07/02/16   Sowles, Drue Stager, MD  Semaglutide (OZEMPIC) 1 MG/DOSE SOPN Inject 1 mg into the skin once a week. 08/13/16   Steele Sizer, MD  telmisartan-hydrochlorothiazide (MICARDIS HCT) 80-25 MG tablet Take 1 tablet by mouth daily. 07/02/16   Steele Sizer, MD  vitamin B-12 (CYANOCOBALAMIN) 1000 MCG tablet Take 1,000 mcg by mouth 2 (two) times daily.    [provider]    Physical Exam BP 138/88   Ht 5\' 9"  (1.753 m)    General: NAD HEENT: normocephalic, anicteric Pulmonary: No increased work of breathing Extremities: no edema, erythema, or tenderness Neurologic: Grossly intact, normal gait Psychiatric: mood appropriate, affect full    Assessment: 66 y.o. with newly diagnosed endometrioid adenocarcinoma, Grade 1.    Plan: Problem List Items Addressed This Visit    Endometrial cancer determined by uterine biopsy ([place]) -  Primary (Chronic)   Relevant Orders   Ambulatory referral to Gynecologic Oncology     This was a new diagnosis discussed today.  Discussed that she needs immediate referral to Gynecologic Oncology.  Patient prefers [facility], given proximity.  Due to severe aortic stenosis, left ventricular hypertrophy, and cardiac dysrhythmia, she is likely not a good surgical candidate for [facility].  Discussed that she had positive findings on uterine biopsy and on endocervical polyp.  If endocervical glands involved without stroma, treatment may only involve hysterectomy.   Discussed importance of diagnosis and need for quick treatment planning.    20 minutes spent in face to face discussion with > 50% spent in counseling and management of her new diagnosis of endometrial cancer.   Prentice Docker, MD 10/29/2016 4:40 PM    CC: Steele Sizer, MD [address] [place], [place] [address]

## 2016-10-30 ENCOUNTER — Encounter: Payer: Self-pay | Admitting: Family Medicine

## 2016-10-31 ENCOUNTER — Inpatient Hospital Stay: Payer: Medicare HMO

## 2016-10-31 ENCOUNTER — Inpatient Hospital Stay: Payer: Medicare HMO | Attending: Obstetrics and Gynecology | Admitting: Obstetrics and Gynecology

## 2016-10-31 VITALS — BP 107/69 | HR 67 | Temp 98.9°F | Resp 18 | Ht 69.0 in | Wt 284.8 lb

## 2016-10-31 DIAGNOSIS — I350 Nonrheumatic aortic (valve) stenosis: Secondary | ICD-10-CM | POA: Diagnosis not present

## 2016-10-31 DIAGNOSIS — I517 Cardiomegaly: Secondary | ICD-10-CM | POA: Diagnosis not present

## 2016-10-31 DIAGNOSIS — N393 Stress incontinence (female) (male): Secondary | ICD-10-CM | POA: Diagnosis not present

## 2016-10-31 DIAGNOSIS — I10 Essential (primary) hypertension: Secondary | ICD-10-CM | POA: Diagnosis not present

## 2016-10-31 DIAGNOSIS — F330 Major depressive disorder, recurrent, mild: Secondary | ICD-10-CM | POA: Diagnosis not present

## 2016-10-31 DIAGNOSIS — Z85828 Personal history of other malignant neoplasm of skin: Secondary | ICD-10-CM | POA: Diagnosis not present

## 2016-10-31 DIAGNOSIS — I890 Lymphedema, not elsewhere classified: Secondary | ICD-10-CM | POA: Insufficient documentation

## 2016-10-31 DIAGNOSIS — E669 Obesity, unspecified: Secondary | ICD-10-CM | POA: Diagnosis not present

## 2016-10-31 DIAGNOSIS — Z7982 Long term (current) use of aspirin: Secondary | ICD-10-CM | POA: Insufficient documentation

## 2016-10-31 DIAGNOSIS — R69 Illness, unspecified: Secondary | ICD-10-CM | POA: Diagnosis not present

## 2016-10-31 DIAGNOSIS — C541 Malignant neoplasm of endometrium: Secondary | ICD-10-CM | POA: Insufficient documentation

## 2016-10-31 DIAGNOSIS — E785 Hyperlipidemia, unspecified: Secondary | ICD-10-CM | POA: Insufficient documentation

## 2016-10-31 DIAGNOSIS — Z79899 Other long term (current) drug therapy: Secondary | ICD-10-CM

## 2016-10-31 LAB — COMPREHENSIVE METABOLIC PANEL
ALK PHOS: 71 U/L (ref 38–126)
ALT: 32 U/L (ref 14–54)
ANION GAP: 11 (ref 5–15)
AST: 26 U/L (ref 15–41)
Albumin: 4.5 g/dL (ref 3.5–5.0)
BILIRUBIN TOTAL: 0.9 mg/dL (ref 0.3–1.2)
BUN: 23 mg/dL — ABNORMAL HIGH (ref 6–20)
CALCIUM: 10 mg/dL (ref 8.9–10.3)
CO2: 25 mmol/L (ref 22–32)
Chloride: 101 mmol/L (ref 101–111)
Creatinine, Ser: 0.87 mg/dL (ref 0.44–1.00)
GFR calc Af Amer: >60 mL/min (ref >60)
Glucose, Bld: 113 mg/dL — ABNORMAL HIGH (ref 65–99)
POTASSIUM: 4.1 mmol/L (ref 3.5–5.1)
Sodium: 137 mmol/L (ref 135–145)
TOTAL PROTEIN: 7.6 g/dL (ref 6.5–8.1)

## 2016-10-31 LAB — CBC WITH DIFFERENTIAL/PLATELET
Basophils Absolute: 0.1 10*3/uL (ref 0–0.1)
Basophils Relative: 1 %
Eosinophils Absolute: 0.1 10*3/uL (ref 0–0.7)
Eosinophils Relative: 1 %
HEMATOCRIT: 42.9 % (ref 35.0–47.0)
HEMOGLOBIN: 14.8 g/dL (ref 12.0–16.0)
Lymphocytes Relative: 25 %
Lymphs Abs: 2.3 10*3/uL (ref 1.0–3.6)
MCH: 34 pg (ref 26.0–34.0)
MCHC: 34.6 g/dL (ref 32.0–36.0)
MCV: 98.5 fL (ref 80.0–100.0)
MONO ABS: 0.9 10*3/uL (ref 0.2–0.9)
MONOS PCT: 10 %
NEUTROS ABS: 6 10*3/uL (ref 1.4–6.5)
Neutrophils Relative %: 63 %
Platelets: 309 10*3/uL (ref 150–440)
RBC: 4.35 MIL/uL (ref 3.80–5.20)
RDW: 13.8 % (ref 11.5–14.5)
WBC: 9.4 10*3/uL (ref 3.6–11.0)

## 2016-10-31 NOTE — Patient Instructions (Signed)
Uterine Cancer Uterine cancer is an abnormal growth of cancer tissue (malignant tumor) in the uterus. Unlike noncancerous (benign) tumors, malignant tumors can spread to other parts of the body. Uterine cancer usually occurs after menopause. However, it may also occur around the time that menopause begins. The wall of the uterus has an inner layer of tissue (endometrium) and an outer layer of muscle tissue (myometrium). The most common type of uterine cancer begins in the endometrium (endometrial cancer). Cancer that begins in the myometrium (uterine sarcoma) is very rare. What are the causes? The exact cause of this condition is not known. What increases the risk? You are more likely to develop this condition if you:  Are older than 50.  Have an enlarged endometrium (endometrial hyperplasia).  Use hormone therapy.  Are severely overweight (obese).  Use the medicine tamoxifen.  You are white (Caucasian).  Cannot bear children (are infertile).  Have never been pregnant.  Started menstruating at an age younger than 12 years.  Are older than 52 and are still having menstrual periods.  Have a history of cancer of the ovaries, intestines, or colon or rectum (colorectal cancer).  Have a history of enlarged ovaries with small cysts (polycystic ovarian syndrome).  Have a family history of: ? Uterine cancer. ? Hereditary nonpolyposis colon cancer (HNPCC).  Have diabetes, high blood pressure, thyroid disease, or gallbladder disease.  Use long-term, high-dose birth control pills.  Have been exposed to radiation.  Smoke.  What are the signs or symptoms? Symptoms of this condition include:  Abnormal vaginal bleeding or discharge. Bleeding may start as a watery, blood-streaked flow that gradually contains more blood. This is the most common symptom. If you experience abnormal vaginal bleeding, do not assume that it is part of menopause.  Vaginal bleeding after  menopause.  Unexplained weight loss.  Bleeding between periods.  Urination that is difficult, painful, or more frequent than usual.  A lump (mass) in the vagina.  Pain, bloating, or fullness in the abdomen.  Pain in the pelvic area.  Pain during sex.  How is this diagnosed? This condition may be diagnosed based on:  Your medical history and your symptoms.  A physical and pelvic exam. Your health care provider will feel your pelvis for any growths or enlarged lymph nodes.  Blood and urine tests.  Imaging tests, such as X-rays, CT scans, ultrasound, or MRIs.  A procedure in which a thin, flexible tube with a light and camera on the end is inserted through the vagina and used to look inside the uterus (hysteroscopy).  A Pap test to check for abnormal cells in the lower part of the uterus (cervix) and the upper vagina.  Removing a tissue sample (biopsy) from the uterine lining to check for cancer cells.  Dilation and curettage (D&C). This is a procedure that involves stretching (dilation) the cervix and scraping (curettage) the inside lining of the uterus to get a biopsy and check for cancer cells.  Your cancer will be staged to determine its severity and extent. Staging is an assessment of:  The size of the tumor.  Whether the cancer has spread.  Where the cancer has spread.  The stages of uterine cancer are as follows:  Stage I. The cancer is only found in the uterus.  Stage II. The cancer has spread to the cervix.  Stage III. The cancer has spread outside the uterus, but not outside the pelvis. The cancer may have spread to the lymph nodes in the pelvis.  Lymph nodes are part of your body's disease-fighting (immune) system. Lymph nodes are found in many locations in your body, including the neck, underarm, and groin.  Stage IV. The cancer has spread to other parts of the body, such as the bladder or rectum.  How is this treated? This condition is often treated with  surgery to remove:  The uterus, cervix, fallopian tubes, and ovaries (total hysterectomy).  The uterus and cervix (simple hysterectomy).  The type of hysterectomy you will have depends on the extent of your cancer. Lymph nodes near the uterus may also be removed in some cases. Treatment may also include one or more of the following:  Chemotherapy. This uses medicines to kill the cancer cells and prevent their spread.  Radiation therapy. This uses high-energy rays to kill the cancer cells and prevent the spread of cancer.  Chemoradiation. This is a combination treatment that alternates chemotherapy with radiation treatments to enhance the way radiation works.  Brachytherapy. This involves placing radioactive materials inside the body where the cancer was removed.  Hormone therapy. This includes taking medicines that lower the levels of estrogen in the body.  Follow these instructions at home: Activity  Return to your normal activities as told by your health care provider. Ask your health care provider what activities are safe for you.  Exercise regularly as told by your health care provider.  Do not drive or use heavy machinery while taking prescription pain medicine. General instructions  Take over-the-counter and prescription medicines only as told by your health care provider.  Maintain a healthy diet.  Work with your health care provider to: ? Manage any long-term (chronic) conditions you have, such as diabetes, high blood pressure, thyroid disease, or gallbladder disease. ? Manage any side effects of your treatment.  Do not use any products that contain nicotine or tobacco, such as cigarettes and e-cigarettes. If you need help quitting, ask your health care provider.  Consider joining a support group to help you cope with stress. Your health care provider may be able to recommend a local or online support group.  Keep all follow-up visits as told by your health care  provider. This is important. Where to find more information:  American Cancer Society: https://www.cancer.Miguel Barrera ([place]): https://www.cancer.gov Contact a health care provider if:  You have pain in your pelvis or abdomen that gets worse.  You cannot urinate.  You have abnormal bleeding.  You have a fever. Get help right away if:  You develop sudden or new severe symptoms, such as: ? Heavy bleeding. ? Severe weakness. ? Pain that is severe or does not get better with medicine. Summary  Uterine cancer is an abnormal growth of cancer tissue (malignant tumor) in the uterus. The most common type of uterine cancer begins in the endometrium (endometrial cancer).  This condition is often treated with surgery to remove the uterus, cervix, fallopian tubes, and ovaries (total hysterectomy) or the uterus and cervix (simple hysterectomy).  Work with your health care provider to manage any long-term (chronic) conditions you have, such as diabetes, high blood pressure, thyroid disease, or gallbladder disease.  Consider joining a support group to help you cope with stress. Your health care provider may be able to recommend a local or online support group. This information is not intended to replace advice given to you by your health care provider. Make sure you discuss any questions you have with your health care provider. Document Released: 01/29/2005 Document Revised: 01/27/2016 Document  Reviewed: 01/27/2016 Elsevier Interactive Patient Education  2017 [employer].

## 2016-10-31 NOTE — Progress Notes (Signed)
No pain, new pt with endometrial cancer. She has a bad heart and edema of bil. Legs from the aortic stenosis.

## 2016-10-31 NOTE — Progress Notes (Signed)
Gynecologic Oncology Consult Visit   Referring Provider: Will Bonnet,  MD  Chief Concern: Grade 1 endometrial cancer  Subjective:  Melody Soto is a 66 y.o. female G2P2 s/p prior XL ovarian cystectomy for benign disease who is seen in consultation from Dr. Glennon Mac for grade 1 endometrial cancer.  She presented with postmenopausal bleeding and work up included:   10/17/2016 Endometrial biopsy and biopsy of cervical polyp  Part A: ENDOMETRIUM, BIOPSY:  WELL DIFFERENTIATED ENDOMETRIAL ADENOCARCINOMA (FIGO I). COMMENT:  THIS CASE WAS REVIEWED IN INTRADEPARTMENTAL CONSULTATION AND THE  DIAGNOSIS REFLECTS OUR CONSENSUS OPINION.DR,JACKSON'S OFFICE WAS INFORMED  OF DIAGNOSIS ON 10/19/16 AT 1.10 PM.  Part B: ENDOCERVIX, POLYP:  MINUTE FRAGMENTS OFOF ADENOCARCINOMA PROBABLY FROM ENDOMETRIUM.  MINUTE FRAGMENTS OF BENIGN ENDOCERVICAL GLANDS, MUCUS, AND BLOOD.  Pap unsatisfactory  10/29/2017 Pelvic ultrasound Uterus 7.3 x 5 x 5 cm with small leiomyomas Bilateral ovaries normal. Endometrial stripe 7.1 mm  She is here today to discuss management options. She is no longer bleeding. She is overall feeling well, despite her extensive cardiac issues. She has no chest pain or significant shortness of breath. She is not able to walk up one flight of stairs without short of breath, but she can walk for long flat distances. She has a strong social support system and her 2 daughters came with her for her visit today.   Problem List: Patient Active Problem List   Diagnosis Date Noted  . Endometrial cancer ([place]) 10/29/2016  . Left ventricular hypertrophy 11/27/2014  . Lymphedema of lower extremity 08/23/2014  . Severe aortic stenosis 08/21/2014  . Benign essential HTN 08/21/2014  . Depression, major, recurrent, mild ([place]) 08/21/2014  . Dyslipidemia 08/21/2014  . Cardiac dysrhythmia 08/21/2014  . Dysmetabolic syndrome 23/53/6144  . Extreme obesity 08/21/2014  . Symptomatic menopausal or female  climacteric states 08/21/2014  . 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 History of shingles 08/21/2014  . Personal history of skin cancer 11/24/2009    Past Medical History: Past Medical History:  Diagnosis Date  . Aortic stenosis   . Cataract   . Depression   . Endometrial cancer determined by uterine biopsy ([place]) 10/29/2016  . Heart murmur    HX OF  . Hyperlipidemia   . Hypertension    CONTROLLED ON MEDS  . Lymphedema   . Shortness of breath dyspnea     Past Surgical History: Past Surgical History:  Procedure Laterality Date  . cataract surgery Bilateral 05/24/2014   second eye 06/07/2014  . MELANOMA EXCISION    . OVARIAN CYST REMOVAL     EXPLORATORY LAPAROTOMY  . TONSILLECTOMY      Past Gynecologic History:  Last Menstrual Period:  2016; postmenopausal Last pap: 2018   OB History:  OB History  Gravida Para Term Preterm AB Living  2 2       2   SAB TAB Ectopic Multiple Live Births               # Outcome Date GA Lbr Len/2nd Weight Sex Delivery Anes PTL Lv  2 Para           1 Para             Obstetric Comments  7 pregancies; 2 NSVDs    Family History: Family History  Problem Relation Age of Onset  . Cancer Mother   . Stroke Mother   . Thyroid nodules Mother   . Diabetes Sister   . Hypertension Sister   . Breast  cancer Maternal Aunt   . Bone cancer Maternal Uncle     Social History: Social History   Social History  . Marital status: Divorced    Spouse name: N/A  . Number of children: N/A  . Years of education: N/A   Occupational History  . Not on file.   Social History Main Topics  . Smoking status: Never Smoker  . Smokeless tobacco: Never Used  . Alcohol use 0.0 oz/week     Comment: Minimal alcohol consumption WINE   . Drug use: No  . Sexual activity: No   Other Topics Concern  . Not on file   Social History Narrative  . No narrative on file    Allergies: Allergies  Allergen Reactions  . Ace Inhibitors Other (See  Comments)    UNKNOWN REACTION  . Duloxetine     drowsiness    Current Medications: Current Outpatient Prescriptions  Medication Sig Dispense Refill  . aspirin 81 MG chewable tablet Chew by mouth. PM    . Calcium Carb-Cholecalciferol (CALCIUM 500 + D3) 500-200 MG-UNIT TABS Take 1 capsule by mouth 2 (two) times daily.    . Coenzyme Q10 (COQ10) 50 MG CAPS Take 1 tablet by mouth.     Marland Kitchen MAGNESIUM CHLORIDE PO Take 1 capsule by mouth daily. Pt does not know the dose    . metoprolol tartrate (LOPRESSOR) 50 MG tablet Take 1 tablet (50 mg total) by mouth 2 (two) times daily. 180 tablet 1  . NON FORMULARY Take 1 capsule by mouth daily. Beet root capsule    . pravastatin (PRAVACHOL) 20 MG tablet Take 1 tablet (20 mg total) by mouth daily. 90 tablet 1  . telmisartan-hydrochlorothiazide (MICARDIS HCT) 80-25 MG tablet Take 1 tablet by mouth daily. 90 tablet 1  . vitamin B-12 (CYANOCOBALAMIN) 1000 MCG tablet Take 1,000 mcg by mouth daily.     . MULTIPLE VITAMIN PO Take 2 tablets by mouth 2 (two) times daily. Reported on 05/19/2015     No current facility-administered medications for this visit.     Review of Systems General: no complaints  HEENT: no complaints  Lungs: no complaints  Cardiac: no complaints  GI: hemorrhoids  GU: no complaints  Musculoskeletal: bilateral knee pain  Extremities: chronic bilateral leg edema since her 40's  Skin: no complaints  Neuro: no complaints  Endocrine: no complaints  Psych: depression        Objective:  Physical Examination:  BP 107/69   Pulse 67   Temp 98.9 F (37.2 C) (Tympanic)   Resp 18   Ht 5\' 9"  (1.753 m)   Wt 284 lb 12.8 oz (129.2 kg)   BMI 42.06 kg/m    ECOG Performance Status: 1 - Symptomatic but completely ambulatory  General appearance: alert, cooperative and appears stated age HEENT: ATNC Lymph node survey: non-palpable, axillary, inguinal, supraclavicular Cardiovascular: RRR Respiratory: B CTA. Abdomen: SNDNT. Obese. Vertical  incision all well healed. No hernias, no masses, no ascites.  Extremities: Bilateral lymphedema Skin exam - Well healed incisions.  Neurological exam reveals alert, oriented, normal speech, no focal findings or movement disorder noted   Pelvic: exam chaperoned by nurse;  Vulva: normal appearing vulva with no masses, tenderness or lesions; Vagina: normal vagina;  Cervix: normal except for small 5 mm polyp in the os; Adnexa: unable to palpate. Uterus: not grossly enlarged and nontender, limited by habitus; Rectal: deferred.    Lab Review Labs on site today: CBC and Chemistry pending  Radiologic Imaging: CXR ordered  Assessment:  Melody Soto is a 66 y.o. female diagnosed with possible stage II grade 1 endometrial cancer with involvement of a cervical polyp. Medical co-morbidities complicating care: left ventricular hypertrophy, severe aortic stenosis, cardiac dysrhythmia, hyperlipidemia,, HTN and obesity.    Plan:   Problem List Items Addressed This Visit      Genitourinary   Endometrial cancer ([place]) - Primary (Chronic)   Relevant Orders   CBC with Differential/Platelet   Comprehensive metabolic panel   DG Chest 2 View     Ms. Marineau has possible stage II endometrial cancer, grade 1 endometrioid, with cervical involvement. She has several significant comorbidities.   We discussed options for management including surgery, hormonal therapy, and radiation. I do think she may be a surgical candidate but we will need cardiac clearance. We will discuss with Dr. Clayborn Bigness for cardiology evaluation and perioperative recommendations. If she is high risk for surgery we will discuss primary radiation given the cervical involvement. I do not think hormonal therapy is the best option for primary treatment given the cervical involvement.   We did discuss risks or surgery in detail. These include infection, anesthesia, bleeding, transfusion, wound separation, vaginal cuff dehiscence,  medical issues (blood clots, stroke, heart attack, fluid in the lungs, pneumonia, abnormal heart rhythm, death), possible exploratory surgery with larger incision, lymphedema, lymphocyst, allergic reaction, injury to adjacent organs (bowel, bladder, blood vessels, nerves, ureters, uterus).   Dr. Clayborn Bigness will see her tomorrow. If he clears her for surgery plan for return to clinic on Oct 2nd at [facility] for Preop visit and PSU. OR date 11/19/2016 for robotic TLH, BSO, SLN injection mapping and biopsy. We did discuss the lymph node assessment may exacerbate her lymphedema, however, lymphatic evaluation may be important given the cervical involvement.   The patient's diagnosis, an outline of the further diagnostic and laboratory studies which will be required, the recommendation, and alternatives were discussed.  All questions were answered to the patient's satisfaction.  A total of 90  minutes were spent with the patient/family today; 60% was spent in education, counseling and coordination of care for endometrial cancer.    Gillis Ends, MD    CC:  Will Bonnet,  MD

## 2016-11-01 DIAGNOSIS — R0602 Shortness of breath: Secondary | ICD-10-CM | POA: Diagnosis not present

## 2016-11-01 DIAGNOSIS — Z6841 Body Mass Index (BMI) 40.0 and over, adult: Secondary | ICD-10-CM | POA: Diagnosis not present

## 2016-11-01 DIAGNOSIS — I208 Other forms of angina pectoris: Secondary | ICD-10-CM | POA: Diagnosis not present

## 2016-11-01 DIAGNOSIS — Z01818 Encounter for other preprocedural examination: Secondary | ICD-10-CM | POA: Diagnosis not present

## 2016-11-01 DIAGNOSIS — C55 Malignant neoplasm of uterus, part unspecified: Secondary | ICD-10-CM | POA: Diagnosis not present

## 2016-11-01 DIAGNOSIS — R5383 Other fatigue: Secondary | ICD-10-CM | POA: Diagnosis not present

## 2016-11-01 DIAGNOSIS — I350 Nonrheumatic aortic (valve) stenosis: Secondary | ICD-10-CM | POA: Diagnosis not present

## 2016-11-09 DIAGNOSIS — I350 Nonrheumatic aortic (valve) stenosis: Secondary | ICD-10-CM | POA: Diagnosis not present

## 2016-11-09 DIAGNOSIS — R0602 Shortness of breath: Secondary | ICD-10-CM | POA: Diagnosis not present

## 2016-11-13 ENCOUNTER — Telehealth: Payer: Self-pay

## 2016-11-13 DIAGNOSIS — C541 Malignant neoplasm of endometrium: Secondary | ICD-10-CM

## 2016-11-13 DIAGNOSIS — C548 Malignant neoplasm of overlapping sites of corpus uteri: Secondary | ICD-10-CM | POA: Diagnosis not present

## 2016-11-13 DIAGNOSIS — I350 Nonrheumatic aortic (valve) stenosis: Secondary | ICD-10-CM | POA: Diagnosis not present

## 2016-11-13 DIAGNOSIS — Z01818 Encounter for other preprocedural examination: Secondary | ICD-10-CM | POA: Diagnosis not present

## 2016-11-13 NOTE — Telephone Encounter (Signed)
  Oncology Nurse Navigator Documentation Orders entered for Dr. Theora Gianotti, [place] [facility] and referral to Dr. Baruch Gouty for Endometrial Cancer Navigator Location: [facility] (11/13/16 1500)   )Navigator Encounter Type: Telephone (11/13/16 1500)                                                    Time Spent with Patient: 15 (11/13/16 1500)

## 2016-11-15 DIAGNOSIS — Z01818 Encounter for other preprocedural examination: Secondary | ICD-10-CM | POA: Diagnosis not present

## 2016-11-15 DIAGNOSIS — I890 Lymphedema, not elsewhere classified: Secondary | ICD-10-CM | POA: Diagnosis not present

## 2016-11-15 DIAGNOSIS — R0602 Shortness of breath: Secondary | ICD-10-CM | POA: Diagnosis not present

## 2016-11-15 DIAGNOSIS — I350 Nonrheumatic aortic (valve) stenosis: Secondary | ICD-10-CM | POA: Diagnosis not present

## 2016-11-15 DIAGNOSIS — R011 Cardiac murmur, unspecified: Secondary | ICD-10-CM | POA: Diagnosis not present

## 2016-11-15 DIAGNOSIS — C55 Malignant neoplasm of uterus, part unspecified: Secondary | ICD-10-CM | POA: Diagnosis not present

## 2016-11-15 DIAGNOSIS — I208 Other forms of angina pectoris: Secondary | ICD-10-CM | POA: Diagnosis not present

## 2016-11-15 DIAGNOSIS — R5383 Other fatigue: Secondary | ICD-10-CM | POA: Diagnosis not present

## 2016-11-15 DIAGNOSIS — E7849 Other hyperlipidemia: Secondary | ICD-10-CM | POA: Diagnosis not present

## 2016-11-16 DIAGNOSIS — C541 Malignant neoplasm of endometrium: Secondary | ICD-10-CM | POA: Diagnosis not present

## 2016-11-19 ENCOUNTER — Encounter: Payer: BLUE CROSS/BLUE SHIELD | Admitting: Family Medicine

## 2016-11-20 ENCOUNTER — Ambulatory Visit
Admission: RE | Admit: 2016-11-20 | Discharge: 2016-11-20 | Disposition: A | Discharge status: Home / Self Care | Payer: Medicare HMO | Source: Ambulatory Visit | Attending: Obstetrics and Gynecology | Admitting: Obstetrics and Gynecology

## 2016-11-20 DIAGNOSIS — I700 Atherosclerosis of aorta: Secondary | ICD-10-CM | POA: Insufficient documentation

## 2016-11-20 DIAGNOSIS — K449 Diaphragmatic hernia without obstruction or gangrene: Secondary | ICD-10-CM | POA: Diagnosis not present

## 2016-11-20 DIAGNOSIS — C541 Malignant neoplasm of endometrium: Secondary | ICD-10-CM

## 2016-11-20 DIAGNOSIS — K8020 Calculus of gallbladder without cholecystitis without obstruction: Secondary | ICD-10-CM | POA: Insufficient documentation

## 2016-11-20 DIAGNOSIS — N6489 Other specified disorders of breast: Secondary | ICD-10-CM | POA: Diagnosis not present

## 2016-11-20 DIAGNOSIS — K5730 Diverticulosis of large intestine without perforation or abscess without bleeding: Secondary | ICD-10-CM | POA: Diagnosis not present

## 2016-11-20 DIAGNOSIS — R599 Enlarged lymph nodes, unspecified: Secondary | ICD-10-CM | POA: Insufficient documentation

## 2016-11-20 MED ORDER — IOPAMIDOL (ISOVUE-300) INJECTION 61%
100.0000 mL | Freq: Once | INTRAVENOUS | Status: AC | PRN
  Administered 2016-11-20: 100 mL via INTRAVENOUS

## 2016-11-21 ENCOUNTER — Encounter: Payer: Self-pay | Admitting: Radiation Oncology

## 2016-11-21 ENCOUNTER — Ambulatory Visit
Admission: RE | Admit: 2016-11-21 | Discharge: 2016-11-21 | Disposition: A | Discharge status: Home / Self Care | Payer: Medicare HMO | Source: Ambulatory Visit | Attending: Radiation Oncology | Admitting: Radiation Oncology

## 2016-11-21 VITALS — BP 136/83 | HR 73 | Temp 98.3°F | Resp 20 | Wt 286.6 lb

## 2016-11-21 DIAGNOSIS — Z803 Family history of malignant neoplasm of breast: Secondary | ICD-10-CM | POA: Diagnosis not present

## 2016-11-21 DIAGNOSIS — E785 Hyperlipidemia, unspecified: Secondary | ICD-10-CM | POA: Insufficient documentation

## 2016-11-21 DIAGNOSIS — Z8542 Personal history of malignant neoplasm of other parts of uterus: Secondary | ICD-10-CM | POA: Insufficient documentation

## 2016-11-21 DIAGNOSIS — Z7982 Long term (current) use of aspirin: Secondary | ICD-10-CM | POA: Diagnosis not present

## 2016-11-21 DIAGNOSIS — F329 Major depressive disorder, single episode, unspecified: Secondary | ICD-10-CM | POA: Diagnosis not present

## 2016-11-21 DIAGNOSIS — I10 Essential (primary) hypertension: Secondary | ICD-10-CM | POA: Diagnosis not present

## 2016-11-21 DIAGNOSIS — R1900 Intra-abdominal and pelvic swelling, mass and lump, unspecified site: Secondary | ICD-10-CM | POA: Diagnosis not present

## 2016-11-21 DIAGNOSIS — Z510 Encounter for antineoplastic radiation therapy: Secondary | ICD-10-CM | POA: Diagnosis present

## 2016-11-21 DIAGNOSIS — C541 Malignant neoplasm of endometrium: Secondary | ICD-10-CM

## 2016-11-21 DIAGNOSIS — R69 Illness, unspecified: Secondary | ICD-10-CM | POA: Diagnosis not present

## 2016-11-21 DIAGNOSIS — Z808 Family history of malignant neoplasm of other organs or systems: Secondary | ICD-10-CM | POA: Insufficient documentation

## 2016-11-21 DIAGNOSIS — Z79899 Other long term (current) drug therapy: Secondary | ICD-10-CM | POA: Insufficient documentation

## 2016-11-21 DIAGNOSIS — I890 Lymphedema, not elsewhere classified: Secondary | ICD-10-CM | POA: Insufficient documentation

## 2016-11-21 DIAGNOSIS — I350 Nonrheumatic aortic (valve) stenosis: Secondary | ICD-10-CM | POA: Insufficient documentation

## 2016-11-21 NOTE — Progress Notes (Signed)
NEW PATIENT EVALUATION  Name: Melody Soto  MRN: [id number]  Date:   11/21/2016     DOB: Dec 02, 1950   This 66 y.o. 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  REFERRING PHYSICIAN: Steele Sizer, MD  CHIEF COMPLAINT:  Chief Complaint  Patient presents with  . Cancer    Pt is here for initial consultation of endometrial cancer    DIAGNOSIS: The encounter diagnosis was Endometrial cancer ([place]).   PREVIOUS INVESTIGATIONS:  CT scan reviewed Pathology report reviewed Clinical notes reviewed  HPI: patient is a 66 year old female who presented with postmenopausal bleeding. Patient is gravida 2 para 2 has had a ovarian cystectomy for benign disease in the past. She underwent biopsy of her endometrium which showed well-differentiated endometrial adenocarcinoma.she has extensive cardiac history including left ventricular hypertrophy severe aortic stenosis cardiac dysrhythmia morbid obesity as well as significant extremity lymphedema.she was not cleared for surgery by cardiology. Pelvic ultrasound demonstrated a 1.3 x 1.5 cm fibroids. Endocervical Pyatte polyp was by biopsy-positive for adenocarcinoma.MRI scan of her pelvis showed an irregular endometrial mass with possible minimal myometrial invasion less than 50%. There was separate foci of signal abnormality in the endocervical canal suspicious for additional site of disease cervical stroma remained intact.she is been consulted by ration oncology at [facility] with a recommendation for whole pelvic radiation plus brachytherapy boost to uterus and residual disease in 5 fractions. Patient would like to have her external beam treatments closer to home and she seen today for opinion. She states she does occasionally still spot. She's having no abdominal pain or discomfort.  PLANNED TREATMENT  REGIMEN: whole pelvic radiation  PAST MEDICAL HISTORY:  has a past medical history of Aortic stenosis; Cataract; Depression; Endometrial cancer determined by uterine biopsy ([place]) (10/29/2016); Heart murmur; Hyperlipidemia; Hypertension; Lymphedema; and Shortness of breath dyspnea.    PAST SURGICAL HISTORY:  Past Surgical History:  Procedure Laterality Date  . cataract surgery Bilateral 05/24/2014   second eye 06/07/2014  . MELANOMA EXCISION    . OVARIAN CYST REMOVAL     EXPLORATORY LAPAROTOMY  . TONSILLECTOMY      FAMILY HISTORY: family history includes Bone cancer in her maternal uncle; Breast cancer in her maternal aunt; Cancer in her mother; Diabetes in her sister; Hypertension in her sister; Stroke in her mother; Thyroid nodules in her mother.  SOCIAL HISTORY:  reports that she has never smoked. She has never used smokeless tobacco. She reports that she drinks alcohol. She reports that she does not use drugs.  ALLERGIES: Ace inhibitors and Duloxetine  MEDICATIONS:  Current Outpatient Prescriptions  Medication Sig Dispense Refill  . aspirin 81 MG chewable tablet Chew by mouth. PM    . Calcium Carb-Cholecalciferol (CALCIUM 500 + D3) 500-200 MG-UNIT TABS Take 1 capsule by mouth 2 (two) times daily.    . Coenzyme Q10 (COQ10) 50 MG CAPS Take 1 tablet by mouth.     Marland Kitchen MAGNESIUM CHLORIDE PO Take 1 capsule by mouth daily. Pt does not know the dose    . metoprolol tartrate (LOPRESSOR) 50 MG tablet Take 1 tablet (50 mg total) by mouth 2 (two) times daily. 180 tablet 1  . MULTIPLE VITAMIN PO Take 2 tablets by mouth 2 (two) times daily. Reported on 05/19/2015    . NON FORMULARY Take 1 capsule by mouth daily. Beet root capsule    . pravastatin (PRAVACHOL) 20 MG tablet Take  1 tablet (20 mg total) by mouth daily. 90 tablet 1  . telmisartan-hydrochlorothiazide (MICARDIS HCT) 80-25 MG tablet Take 1 tablet by mouth daily. 90 tablet 1  . vitamin B-12 (CYANOCOBALAMIN) 1000 MCG tablet Take 1,000 mcg by  mouth daily.      No current facility-administered medications for this encounter.     ECOG PERFORMANCE STATUS:  1 - Symptomatic but completely ambulatory  REVIEW OF SYSTEMS: patient does have dyspnea on exertion and extremity lymphedema. Patient denies any weight loss, fatigue, weakness, fever, chills or night sweats. Patient denies any loss of vision, blurred vision. Patient denies any ringing  of the ears or hearing loss. No irregular heartbeat. Patient denies heart murmur or history of fainting. Patient denies any chest pain or pain radiating to her upper extremities. Patient denies any shortness of breath, difficulty breathing at night, cough or hemoptysis. Patient denies any swelling in the lower legs. Patient denies any nausea vomiting, vomiting of blood, or coffee ground material in the vomitus. Patient denies any stomach pain. Patient states has had normal bowel movements no significant constipation or diarrhea. Patient denies any dysuria, hematuria or significant nocturia. Patient denies any problems walking, swelling in the joints or loss of balance. Patient denies any skin changes, loss of hair or loss of weight. Patient denies any excessive worrying or anxiety or significant depression. Patient denies any problems with insomnia. Patient denies excessive thirst, polyuria, polydipsia. Patient denies any swollen glands, patient denies easy bruising or easy bleeding. Patient denies any recent infections, allergies or URI. Patient "s visual fields have not changed significantly in recent time.    PHYSICAL EXAM: BP 136/83   Pulse 73   Temp 98.3 F (36.8 C)   Resp 20   Wt 286 lb 9.6 oz (130 kg)   BMI 42.32 kg/m  Well-developed obese female in NAD. She has upper and lower extremity edema. She has significant lymphedema in her lower extremities. On speculum examination vaginal vault is clear there is some distortion of the cervical os. Bimanual examination shows no evidence of parametrial  mass or nodularity. Well-developed well-nourished patient in NAD. HEENT reveals PERLA, EOMI, discs not visualized.  Oral cavity is clear. No oral mucosal lesions are identified. Neck is clear without evidence of cervical or supraclavicular adenopathy. Lungs are clear to A&P. Cardiac examination is essentially unremarkable with regular rate and rhythm without murmur rub or thrill. Abdomen is benign with no organomegaly or masses noted. Motor sensory and DTR levels are equal and symmetric in the upper and lower extremities. Cranial nerves II through XII are grossly intact. Proprioception is intact. No peripheral adenopathy or edema is identified. No motor or sensory levels are noted. Crude visual fields are within normal range.  LABORATORY DATA: pathology reports are reviewed    RADIOLOGY RESULTS:MRI scan and CT scans are reviewed and compatible with the above-stated findings   IMPRESSION: probable stage I/stage II adenocarcinoma the endometrium in nonsurgical candidate based on significant cardiac history in 66 year old female  PLAN: at this time I to go ahead with pelvic radiation therapy. I would plan on delivering 4500 cGy over 5 weeks. Risks and benefits of treatment including increased lower urinary tract symptoms possible diarrhea fatigue alteration of blood counts all were discussed in detail with the patient and her daughters. They all seem to comprehend my treatment plan well. I personally ordered and scheduled CT simulation for early next week. I will refer the patient back to do approximate 1 week prior to completion of external  beam radiation therapy to they can begin planning her brachytherapy. Patient and her family seem to comprehend my treatment plan well.  I would like to take this opportunity to thank you for allowing me to participate in the care of your patient.Armstead Peaks., MD

## 2016-11-27 ENCOUNTER — Ambulatory Visit
Admission: RE | Admit: 2016-11-27 | Discharge: 2016-11-27 | Disposition: A | Discharge status: Home / Self Care | Payer: Medicare HMO | Source: Ambulatory Visit | Attending: Radiation Oncology | Admitting: Radiation Oncology

## 2016-11-27 DIAGNOSIS — Z510 Encounter for antineoplastic radiation therapy: Secondary | ICD-10-CM | POA: Diagnosis not present

## 2016-11-27 DIAGNOSIS — R69 Illness, unspecified: Secondary | ICD-10-CM | POA: Diagnosis not present

## 2016-11-27 DIAGNOSIS — I350 Nonrheumatic aortic (valve) stenosis: Secondary | ICD-10-CM | POA: Diagnosis not present

## 2016-11-27 DIAGNOSIS — R1900 Intra-abdominal and pelvic swelling, mass and lump, unspecified site: Secondary | ICD-10-CM | POA: Diagnosis not present

## 2016-11-27 DIAGNOSIS — E785 Hyperlipidemia, unspecified: Secondary | ICD-10-CM | POA: Diagnosis not present

## 2016-11-27 DIAGNOSIS — I890 Lymphedema, not elsewhere classified: Secondary | ICD-10-CM | POA: Diagnosis not present

## 2016-11-27 DIAGNOSIS — Z79899 Other long term (current) drug therapy: Secondary | ICD-10-CM | POA: Diagnosis not present

## 2016-11-27 DIAGNOSIS — Z7982 Long term (current) use of aspirin: Secondary | ICD-10-CM | POA: Diagnosis not present

## 2016-11-27 DIAGNOSIS — I10 Essential (primary) hypertension: Secondary | ICD-10-CM | POA: Diagnosis not present

## 2016-11-28 DIAGNOSIS — I350 Nonrheumatic aortic (valve) stenosis: Secondary | ICD-10-CM | POA: Diagnosis not present

## 2016-11-30 ENCOUNTER — Other Ambulatory Visit: Payer: Self-pay | Admitting: *Deleted

## 2016-11-30 ENCOUNTER — Ambulatory Visit
Admission: RE | Admit: 2016-11-30 | Discharge: 2016-11-30 | Disposition: A | Discharge status: Home / Self Care | Payer: Medicare HMO | Source: Ambulatory Visit | Attending: Radiation Oncology | Admitting: Radiation Oncology

## 2016-11-30 DIAGNOSIS — C541 Malignant neoplasm of endometrium: Secondary | ICD-10-CM

## 2016-12-03 DIAGNOSIS — Z7982 Long term (current) use of aspirin: Secondary | ICD-10-CM | POA: Diagnosis not present

## 2016-12-03 DIAGNOSIS — R1900 Intra-abdominal and pelvic swelling, mass and lump, unspecified site: Secondary | ICD-10-CM | POA: Diagnosis not present

## 2016-12-03 DIAGNOSIS — I10 Essential (primary) hypertension: Secondary | ICD-10-CM | POA: Diagnosis not present

## 2016-12-03 DIAGNOSIS — E785 Hyperlipidemia, unspecified: Secondary | ICD-10-CM | POA: Diagnosis not present

## 2016-12-03 DIAGNOSIS — I890 Lymphedema, not elsewhere classified: Secondary | ICD-10-CM | POA: Diagnosis not present

## 2016-12-03 DIAGNOSIS — Z510 Encounter for antineoplastic radiation therapy: Secondary | ICD-10-CM | POA: Diagnosis not present

## 2016-12-03 DIAGNOSIS — R69 Illness, unspecified: Secondary | ICD-10-CM | POA: Diagnosis not present

## 2016-12-03 DIAGNOSIS — I350 Nonrheumatic aortic (valve) stenosis: Secondary | ICD-10-CM | POA: Diagnosis not present

## 2016-12-03 DIAGNOSIS — Z79899 Other long term (current) drug therapy: Secondary | ICD-10-CM | POA: Diagnosis not present

## 2016-12-04 ENCOUNTER — Ambulatory Visit
Admission: RE | Admit: 2016-12-04 | Discharge: 2016-12-04 | Disposition: A | Discharge status: Home / Self Care | Payer: Medicare HMO | Source: Ambulatory Visit | Attending: Radiation Oncology | Admitting: Radiation Oncology

## 2016-12-04 DIAGNOSIS — I890 Lymphedema, not elsewhere classified: Secondary | ICD-10-CM | POA: Diagnosis not present

## 2016-12-04 DIAGNOSIS — Z7982 Long term (current) use of aspirin: Secondary | ICD-10-CM | POA: Diagnosis not present

## 2016-12-04 DIAGNOSIS — I10 Essential (primary) hypertension: Secondary | ICD-10-CM | POA: Diagnosis not present

## 2016-12-04 DIAGNOSIS — R69 Illness, unspecified: Secondary | ICD-10-CM | POA: Diagnosis not present

## 2016-12-04 DIAGNOSIS — E785 Hyperlipidemia, unspecified: Secondary | ICD-10-CM | POA: Diagnosis not present

## 2016-12-04 DIAGNOSIS — Z510 Encounter for antineoplastic radiation therapy: Secondary | ICD-10-CM | POA: Diagnosis not present

## 2016-12-04 DIAGNOSIS — I350 Nonrheumatic aortic (valve) stenosis: Secondary | ICD-10-CM | POA: Diagnosis not present

## 2016-12-04 DIAGNOSIS — R1900 Intra-abdominal and pelvic swelling, mass and lump, unspecified site: Secondary | ICD-10-CM | POA: Diagnosis not present

## 2016-12-04 DIAGNOSIS — Z79899 Other long term (current) drug therapy: Secondary | ICD-10-CM | POA: Diagnosis not present

## 2016-12-05 ENCOUNTER — Ambulatory Visit
Admission: RE | Admit: 2016-12-05 | Discharge: 2016-12-05 | Disposition: A | Discharge status: Home / Self Care | Payer: Medicare HMO | Source: Ambulatory Visit | Attending: Radiation Oncology | Admitting: Radiation Oncology

## 2016-12-05 DIAGNOSIS — E785 Hyperlipidemia, unspecified: Secondary | ICD-10-CM | POA: Diagnosis not present

## 2016-12-05 DIAGNOSIS — Z7982 Long term (current) use of aspirin: Secondary | ICD-10-CM | POA: Diagnosis not present

## 2016-12-05 DIAGNOSIS — I890 Lymphedema, not elsewhere classified: Secondary | ICD-10-CM | POA: Diagnosis not present

## 2016-12-05 DIAGNOSIS — I10 Essential (primary) hypertension: Secondary | ICD-10-CM | POA: Diagnosis not present

## 2016-12-05 DIAGNOSIS — Z79899 Other long term (current) drug therapy: Secondary | ICD-10-CM | POA: Diagnosis not present

## 2016-12-05 DIAGNOSIS — I350 Nonrheumatic aortic (valve) stenosis: Secondary | ICD-10-CM | POA: Diagnosis not present

## 2016-12-05 DIAGNOSIS — Z510 Encounter for antineoplastic radiation therapy: Secondary | ICD-10-CM | POA: Diagnosis not present

## 2016-12-05 DIAGNOSIS — R1900 Intra-abdominal and pelvic swelling, mass and lump, unspecified site: Secondary | ICD-10-CM | POA: Diagnosis not present

## 2016-12-05 DIAGNOSIS — R69 Illness, unspecified: Secondary | ICD-10-CM | POA: Diagnosis not present

## 2016-12-06 ENCOUNTER — Ambulatory Visit
Admission: RE | Admit: 2016-12-06 | Discharge: 2016-12-06 | Disposition: A | Discharge status: Home / Self Care | Payer: Medicare HMO | Source: Ambulatory Visit | Attending: Radiation Oncology | Admitting: Radiation Oncology

## 2016-12-06 DIAGNOSIS — I350 Nonrheumatic aortic (valve) stenosis: Secondary | ICD-10-CM | POA: Diagnosis not present

## 2016-12-06 DIAGNOSIS — Z79899 Other long term (current) drug therapy: Secondary | ICD-10-CM | POA: Diagnosis not present

## 2016-12-06 DIAGNOSIS — R69 Illness, unspecified: Secondary | ICD-10-CM | POA: Diagnosis not present

## 2016-12-06 DIAGNOSIS — Z7982 Long term (current) use of aspirin: Secondary | ICD-10-CM | POA: Diagnosis not present

## 2016-12-06 DIAGNOSIS — Z510 Encounter for antineoplastic radiation therapy: Secondary | ICD-10-CM | POA: Diagnosis not present

## 2016-12-06 DIAGNOSIS — I10 Essential (primary) hypertension: Secondary | ICD-10-CM | POA: Diagnosis not present

## 2016-12-06 DIAGNOSIS — I890 Lymphedema, not elsewhere classified: Secondary | ICD-10-CM | POA: Diagnosis not present

## 2016-12-06 DIAGNOSIS — R1900 Intra-abdominal and pelvic swelling, mass and lump, unspecified site: Secondary | ICD-10-CM | POA: Diagnosis not present

## 2016-12-06 DIAGNOSIS — E785 Hyperlipidemia, unspecified: Secondary | ICD-10-CM | POA: Diagnosis not present

## 2016-12-07 ENCOUNTER — Ambulatory Visit: Payer: Medicare HMO

## 2016-12-10 ENCOUNTER — Ambulatory Visit
Admission: RE | Admit: 2016-12-10 | Discharge: 2016-12-10 | Disposition: A | Discharge status: Home / Self Care | Payer: Medicare HMO | Source: Ambulatory Visit | Attending: Radiation Oncology | Admitting: Radiation Oncology

## 2016-12-10 DIAGNOSIS — I10 Essential (primary) hypertension: Secondary | ICD-10-CM | POA: Diagnosis not present

## 2016-12-10 DIAGNOSIS — E785 Hyperlipidemia, unspecified: Secondary | ICD-10-CM | POA: Diagnosis not present

## 2016-12-10 DIAGNOSIS — Z510 Encounter for antineoplastic radiation therapy: Secondary | ICD-10-CM | POA: Diagnosis not present

## 2016-12-10 DIAGNOSIS — R69 Illness, unspecified: Secondary | ICD-10-CM | POA: Diagnosis not present

## 2016-12-10 DIAGNOSIS — R1900 Intra-abdominal and pelvic swelling, mass and lump, unspecified site: Secondary | ICD-10-CM | POA: Diagnosis not present

## 2016-12-10 DIAGNOSIS — I350 Nonrheumatic aortic (valve) stenosis: Secondary | ICD-10-CM | POA: Diagnosis not present

## 2016-12-10 DIAGNOSIS — Z7982 Long term (current) use of aspirin: Secondary | ICD-10-CM | POA: Diagnosis not present

## 2016-12-10 DIAGNOSIS — I890 Lymphedema, not elsewhere classified: Secondary | ICD-10-CM | POA: Diagnosis not present

## 2016-12-10 DIAGNOSIS — Z79899 Other long term (current) drug therapy: Secondary | ICD-10-CM | POA: Diagnosis not present

## 2016-12-11 ENCOUNTER — Ambulatory Visit
Admission: RE | Admit: 2016-12-11 | Discharge: 2016-12-11 | Disposition: A | Discharge status: Home / Self Care | Payer: Medicare HMO | Source: Ambulatory Visit | Attending: Radiation Oncology | Admitting: Radiation Oncology

## 2016-12-11 DIAGNOSIS — R1900 Intra-abdominal and pelvic swelling, mass and lump, unspecified site: Secondary | ICD-10-CM | POA: Diagnosis not present

## 2016-12-11 DIAGNOSIS — I350 Nonrheumatic aortic (valve) stenosis: Secondary | ICD-10-CM | POA: Diagnosis not present

## 2016-12-11 DIAGNOSIS — R69 Illness, unspecified: Secondary | ICD-10-CM | POA: Diagnosis not present

## 2016-12-11 DIAGNOSIS — I10 Essential (primary) hypertension: Secondary | ICD-10-CM | POA: Diagnosis not present

## 2016-12-11 DIAGNOSIS — I890 Lymphedema, not elsewhere classified: Secondary | ICD-10-CM | POA: Diagnosis not present

## 2016-12-11 DIAGNOSIS — Z510 Encounter for antineoplastic radiation therapy: Secondary | ICD-10-CM | POA: Diagnosis not present

## 2016-12-11 DIAGNOSIS — Z7982 Long term (current) use of aspirin: Secondary | ICD-10-CM | POA: Diagnosis not present

## 2016-12-11 DIAGNOSIS — E785 Hyperlipidemia, unspecified: Secondary | ICD-10-CM | POA: Diagnosis not present

## 2016-12-11 DIAGNOSIS — Z79899 Other long term (current) drug therapy: Secondary | ICD-10-CM | POA: Diagnosis not present

## 2016-12-12 ENCOUNTER — Ambulatory Visit
Admission: RE | Admit: 2016-12-12 | Discharge: 2016-12-12 | Disposition: A | Discharge status: Home / Self Care | Payer: Medicare HMO | Source: Ambulatory Visit | Attending: Radiation Oncology | Admitting: Radiation Oncology

## 2016-12-12 ENCOUNTER — Inpatient Hospital Stay: Payer: Medicare HMO | Attending: Radiation Oncology

## 2016-12-12 DIAGNOSIS — Z7982 Long term (current) use of aspirin: Secondary | ICD-10-CM | POA: Diagnosis not present

## 2016-12-12 DIAGNOSIS — C541 Malignant neoplasm of endometrium: Secondary | ICD-10-CM | POA: Diagnosis not present

## 2016-12-12 DIAGNOSIS — Z510 Encounter for antineoplastic radiation therapy: Secondary | ICD-10-CM | POA: Diagnosis not present

## 2016-12-12 DIAGNOSIS — I350 Nonrheumatic aortic (valve) stenosis: Secondary | ICD-10-CM | POA: Diagnosis not present

## 2016-12-12 DIAGNOSIS — I890 Lymphedema, not elsewhere classified: Secondary | ICD-10-CM | POA: Diagnosis not present

## 2016-12-12 DIAGNOSIS — Z79899 Other long term (current) drug therapy: Secondary | ICD-10-CM | POA: Diagnosis not present

## 2016-12-12 DIAGNOSIS — E785 Hyperlipidemia, unspecified: Secondary | ICD-10-CM | POA: Diagnosis not present

## 2016-12-12 DIAGNOSIS — R1900 Intra-abdominal and pelvic swelling, mass and lump, unspecified site: Secondary | ICD-10-CM | POA: Diagnosis not present

## 2016-12-12 DIAGNOSIS — I10 Essential (primary) hypertension: Secondary | ICD-10-CM | POA: Diagnosis not present

## 2016-12-12 DIAGNOSIS — R69 Illness, unspecified: Secondary | ICD-10-CM | POA: Diagnosis not present

## 2016-12-12 LAB — CBC
HEMATOCRIT: 41.6 % (ref 35.0–47.0)
HEMOGLOBIN: 14 g/dL (ref 12.0–16.0)
MCH: 33.9 pg (ref 26.0–34.0)
MCHC: 33.7 g/dL (ref 32.0–36.0)
MCV: 100.7 fL — AB (ref 80.0–100.0)
Platelets: 237 10*3/uL (ref 150–440)
RBC: 4.13 MIL/uL (ref 3.80–5.20)
RDW: 13.8 % (ref 11.5–14.5)
WBC: 5.5 10*3/uL (ref 3.6–11.0)

## 2016-12-13 ENCOUNTER — Ambulatory Visit
Admission: RE | Admit: 2016-12-13 | Discharge: 2016-12-13 | Disposition: A | Discharge status: Home / Self Care | Payer: Medicare HMO | Source: Ambulatory Visit | Attending: Radiation Oncology | Admitting: Radiation Oncology

## 2016-12-13 DIAGNOSIS — Z510 Encounter for antineoplastic radiation therapy: Secondary | ICD-10-CM | POA: Diagnosis not present

## 2016-12-13 DIAGNOSIS — I350 Nonrheumatic aortic (valve) stenosis: Secondary | ICD-10-CM | POA: Diagnosis not present

## 2016-12-13 DIAGNOSIS — R1900 Intra-abdominal and pelvic swelling, mass and lump, unspecified site: Secondary | ICD-10-CM | POA: Diagnosis not present

## 2016-12-13 DIAGNOSIS — E785 Hyperlipidemia, unspecified: Secondary | ICD-10-CM | POA: Diagnosis not present

## 2016-12-13 DIAGNOSIS — R69 Illness, unspecified: Secondary | ICD-10-CM | POA: Diagnosis not present

## 2016-12-13 DIAGNOSIS — I10 Essential (primary) hypertension: Secondary | ICD-10-CM | POA: Diagnosis not present

## 2016-12-13 DIAGNOSIS — Z79899 Other long term (current) drug therapy: Secondary | ICD-10-CM | POA: Diagnosis not present

## 2016-12-13 DIAGNOSIS — I890 Lymphedema, not elsewhere classified: Secondary | ICD-10-CM | POA: Diagnosis not present

## 2016-12-13 DIAGNOSIS — Z7982 Long term (current) use of aspirin: Secondary | ICD-10-CM | POA: Diagnosis not present

## 2016-12-14 ENCOUNTER — Ambulatory Visit
Admission: RE | Admit: 2016-12-14 | Discharge: 2016-12-14 | Disposition: A | Discharge status: Home / Self Care | Payer: Medicare HMO | Source: Ambulatory Visit | Attending: Radiation Oncology | Admitting: Radiation Oncology

## 2016-12-14 DIAGNOSIS — Z7982 Long term (current) use of aspirin: Secondary | ICD-10-CM | POA: Diagnosis not present

## 2016-12-14 DIAGNOSIS — I10 Essential (primary) hypertension: Secondary | ICD-10-CM | POA: Diagnosis not present

## 2016-12-14 DIAGNOSIS — Z510 Encounter for antineoplastic radiation therapy: Secondary | ICD-10-CM | POA: Diagnosis not present

## 2016-12-14 DIAGNOSIS — E785 Hyperlipidemia, unspecified: Secondary | ICD-10-CM | POA: Diagnosis not present

## 2016-12-14 DIAGNOSIS — I890 Lymphedema, not elsewhere classified: Secondary | ICD-10-CM | POA: Diagnosis not present

## 2016-12-14 DIAGNOSIS — R1900 Intra-abdominal and pelvic swelling, mass and lump, unspecified site: Secondary | ICD-10-CM | POA: Diagnosis not present

## 2016-12-14 DIAGNOSIS — R69 Illness, unspecified: Secondary | ICD-10-CM | POA: Diagnosis not present

## 2016-12-14 DIAGNOSIS — I350 Nonrheumatic aortic (valve) stenosis: Secondary | ICD-10-CM | POA: Diagnosis not present

## 2016-12-14 DIAGNOSIS — Z79899 Other long term (current) drug therapy: Secondary | ICD-10-CM | POA: Diagnosis not present

## 2016-12-17 ENCOUNTER — Other Ambulatory Visit: Payer: Medicare HMO

## 2016-12-17 ENCOUNTER — Ambulatory Visit: Payer: Medicare HMO

## 2016-12-18 ENCOUNTER — Ambulatory Visit
Admission: RE | Admit: 2016-12-18 | Discharge: 2016-12-18 | Disposition: A | Discharge status: Home / Self Care | Payer: Medicare HMO | Source: Ambulatory Visit | Attending: Radiation Oncology | Admitting: Radiation Oncology

## 2016-12-18 DIAGNOSIS — R1900 Intra-abdominal and pelvic swelling, mass and lump, unspecified site: Secondary | ICD-10-CM | POA: Diagnosis not present

## 2016-12-18 DIAGNOSIS — I350 Nonrheumatic aortic (valve) stenosis: Secondary | ICD-10-CM | POA: Diagnosis not present

## 2016-12-18 DIAGNOSIS — I890 Lymphedema, not elsewhere classified: Secondary | ICD-10-CM | POA: Diagnosis not present

## 2016-12-18 DIAGNOSIS — R69 Illness, unspecified: Secondary | ICD-10-CM | POA: Diagnosis not present

## 2016-12-18 DIAGNOSIS — Z79899 Other long term (current) drug therapy: Secondary | ICD-10-CM | POA: Diagnosis not present

## 2016-12-18 DIAGNOSIS — Z510 Encounter for antineoplastic radiation therapy: Secondary | ICD-10-CM | POA: Diagnosis not present

## 2016-12-18 DIAGNOSIS — Z7982 Long term (current) use of aspirin: Secondary | ICD-10-CM | POA: Diagnosis not present

## 2016-12-18 DIAGNOSIS — I10 Essential (primary) hypertension: Secondary | ICD-10-CM | POA: Diagnosis not present

## 2016-12-18 DIAGNOSIS — E785 Hyperlipidemia, unspecified: Secondary | ICD-10-CM | POA: Diagnosis not present

## 2016-12-19 ENCOUNTER — Inpatient Hospital Stay: Payer: Medicare HMO | Attending: Radiation Oncology

## 2016-12-19 ENCOUNTER — Ambulatory Visit
Admission: RE | Admit: 2016-12-19 | Discharge: 2016-12-19 | Disposition: A | Discharge status: Home / Self Care | Payer: Medicare HMO | Source: Ambulatory Visit | Attending: Radiation Oncology | Admitting: Radiation Oncology

## 2016-12-19 ENCOUNTER — Other Ambulatory Visit: Payer: Self-pay

## 2016-12-19 DIAGNOSIS — E785 Hyperlipidemia, unspecified: Secondary | ICD-10-CM | POA: Diagnosis not present

## 2016-12-19 DIAGNOSIS — I890 Lymphedema, not elsewhere classified: Secondary | ICD-10-CM | POA: Diagnosis not present

## 2016-12-19 DIAGNOSIS — I10 Essential (primary) hypertension: Secondary | ICD-10-CM

## 2016-12-19 DIAGNOSIS — R1900 Intra-abdominal and pelvic swelling, mass and lump, unspecified site: Secondary | ICD-10-CM | POA: Diagnosis not present

## 2016-12-19 DIAGNOSIS — Z79899 Other long term (current) drug therapy: Secondary | ICD-10-CM | POA: Diagnosis not present

## 2016-12-19 DIAGNOSIS — R69 Illness, unspecified: Secondary | ICD-10-CM | POA: Diagnosis not present

## 2016-12-19 DIAGNOSIS — Z7982 Long term (current) use of aspirin: Secondary | ICD-10-CM | POA: Diagnosis not present

## 2016-12-19 DIAGNOSIS — I350 Nonrheumatic aortic (valve) stenosis: Secondary | ICD-10-CM | POA: Diagnosis not present

## 2016-12-19 DIAGNOSIS — C541 Malignant neoplasm of endometrium: Secondary | ICD-10-CM

## 2016-12-19 DIAGNOSIS — Z510 Encounter for antineoplastic radiation therapy: Secondary | ICD-10-CM | POA: Diagnosis not present

## 2016-12-19 LAB — CBC
HCT: 40.7 % (ref 35.0–47.0)
HEMOGLOBIN: 13.8 g/dL (ref 12.0–16.0)
MCH: 34.3 pg — ABNORMAL HIGH (ref 26.0–34.0)
MCHC: 33.8 g/dL (ref 32.0–36.0)
MCV: 101.4 fL — ABNORMAL HIGH (ref 80.0–100.0)
Platelets: 182 10*3/uL (ref 150–440)
RBC: 4.02 MIL/uL (ref 3.80–5.20)
RDW: 13.2 % (ref 11.5–14.5)
WBC: 4.6 10*3/uL (ref 3.6–11.0)

## 2016-12-19 NOTE — Telephone Encounter (Signed)
Refill request for Hypertension medication: Telmisartan-HCTZ  Last office visit pertaining to hypertension: 06/12/2016  BP Readings from Last 3 Encounters:  11/21/16 136/83  10/31/16 107/69  10/29/16 138/88     Lab Results  Component Value Date   CREATININE 0.87 10/31/2016   BUN 23 (H) 10/31/2016   NA 137 10/31/2016   K 4.1 10/31/2016   CL 101 10/31/2016   CO2 25 10/31/2016    Follow up scheduled: None indicated

## 2016-12-20 ENCOUNTER — Ambulatory Visit
Admission: RE | Admit: 2016-12-20 | Discharge: 2016-12-20 | Disposition: A | Discharge status: Home / Self Care | Payer: Medicare HMO | Source: Ambulatory Visit | Attending: Radiation Oncology | Admitting: Radiation Oncology

## 2016-12-20 DIAGNOSIS — E785 Hyperlipidemia, unspecified: Secondary | ICD-10-CM | POA: Diagnosis not present

## 2016-12-20 DIAGNOSIS — Z510 Encounter for antineoplastic radiation therapy: Secondary | ICD-10-CM | POA: Diagnosis not present

## 2016-12-20 DIAGNOSIS — I890 Lymphedema, not elsewhere classified: Secondary | ICD-10-CM | POA: Diagnosis not present

## 2016-12-20 DIAGNOSIS — R69 Illness, unspecified: Secondary | ICD-10-CM | POA: Diagnosis not present

## 2016-12-20 DIAGNOSIS — Z7982 Long term (current) use of aspirin: Secondary | ICD-10-CM | POA: Diagnosis not present

## 2016-12-20 DIAGNOSIS — I10 Essential (primary) hypertension: Secondary | ICD-10-CM | POA: Diagnosis not present

## 2016-12-20 DIAGNOSIS — Z79899 Other long term (current) drug therapy: Secondary | ICD-10-CM | POA: Diagnosis not present

## 2016-12-20 DIAGNOSIS — I350 Nonrheumatic aortic (valve) stenosis: Secondary | ICD-10-CM | POA: Diagnosis not present

## 2016-12-20 DIAGNOSIS — R1900 Intra-abdominal and pelvic swelling, mass and lump, unspecified site: Secondary | ICD-10-CM | POA: Diagnosis not present

## 2016-12-21 ENCOUNTER — Ambulatory Visit: Payer: Medicare HMO

## 2016-12-21 DIAGNOSIS — Z01810 Encounter for preprocedural cardiovascular examination: Secondary | ICD-10-CM | POA: Diagnosis not present

## 2016-12-21 DIAGNOSIS — I10 Essential (primary) hypertension: Secondary | ICD-10-CM | POA: Diagnosis not present

## 2016-12-21 DIAGNOSIS — E785 Hyperlipidemia, unspecified: Secondary | ICD-10-CM | POA: Diagnosis not present

## 2016-12-21 DIAGNOSIS — I350 Nonrheumatic aortic (valve) stenosis: Secondary | ICD-10-CM | POA: Diagnosis not present

## 2016-12-21 DIAGNOSIS — C541 Malignant neoplasm of endometrium: Secondary | ICD-10-CM | POA: Diagnosis not present

## 2016-12-21 DIAGNOSIS — R0602 Shortness of breath: Secondary | ICD-10-CM | POA: Diagnosis not present

## 2016-12-21 DIAGNOSIS — Z6841 Body Mass Index (BMI) 40.0 and over, adult: Secondary | ICD-10-CM | POA: Diagnosis not present

## 2016-12-24 ENCOUNTER — Other Ambulatory Visit: Payer: Medicare HMO

## 2016-12-24 ENCOUNTER — Ambulatory Visit: Payer: Medicare HMO

## 2016-12-24 DIAGNOSIS — I350 Nonrheumatic aortic (valve) stenosis: Secondary | ICD-10-CM | POA: Diagnosis not present

## 2016-12-24 DIAGNOSIS — R0609 Other forms of dyspnea: Secondary | ICD-10-CM | POA: Diagnosis not present

## 2016-12-24 DIAGNOSIS — I517 Cardiomegaly: Secondary | ICD-10-CM | POA: Diagnosis not present

## 2016-12-24 DIAGNOSIS — Z952 Presence of prosthetic heart valve: Secondary | ICD-10-CM | POA: Diagnosis not present

## 2016-12-25 ENCOUNTER — Ambulatory Visit
Admission: RE | Admit: 2016-12-25 | Discharge: 2016-12-25 | Disposition: A | Discharge status: Home / Self Care | Payer: Medicare HMO | Source: Ambulatory Visit | Attending: Radiation Oncology | Admitting: Radiation Oncology

## 2016-12-25 DIAGNOSIS — Z79899 Other long term (current) drug therapy: Secondary | ICD-10-CM | POA: Diagnosis not present

## 2016-12-25 DIAGNOSIS — R69 Illness, unspecified: Secondary | ICD-10-CM | POA: Diagnosis not present

## 2016-12-25 DIAGNOSIS — E785 Hyperlipidemia, unspecified: Secondary | ICD-10-CM | POA: Diagnosis not present

## 2016-12-25 DIAGNOSIS — Z7982 Long term (current) use of aspirin: Secondary | ICD-10-CM | POA: Diagnosis not present

## 2016-12-25 DIAGNOSIS — I10 Essential (primary) hypertension: Secondary | ICD-10-CM | POA: Diagnosis not present

## 2016-12-25 DIAGNOSIS — I890 Lymphedema, not elsewhere classified: Secondary | ICD-10-CM | POA: Diagnosis not present

## 2016-12-25 DIAGNOSIS — Z510 Encounter for antineoplastic radiation therapy: Secondary | ICD-10-CM | POA: Diagnosis not present

## 2016-12-25 DIAGNOSIS — I350 Nonrheumatic aortic (valve) stenosis: Secondary | ICD-10-CM | POA: Diagnosis not present

## 2016-12-25 DIAGNOSIS — R1900 Intra-abdominal and pelvic swelling, mass and lump, unspecified site: Secondary | ICD-10-CM | POA: Diagnosis not present

## 2016-12-26 ENCOUNTER — Ambulatory Visit
Admission: RE | Admit: 2016-12-26 | Discharge: 2016-12-26 | Disposition: A | Discharge status: Home / Self Care | Payer: Medicare HMO | Source: Ambulatory Visit | Attending: Radiation Oncology | Admitting: Radiation Oncology

## 2016-12-26 ENCOUNTER — Inpatient Hospital Stay: Payer: Medicare HMO

## 2016-12-26 DIAGNOSIS — E785 Hyperlipidemia, unspecified: Secondary | ICD-10-CM | POA: Diagnosis not present

## 2016-12-26 DIAGNOSIS — Z7982 Long term (current) use of aspirin: Secondary | ICD-10-CM | POA: Diagnosis not present

## 2016-12-26 DIAGNOSIS — I350 Nonrheumatic aortic (valve) stenosis: Secondary | ICD-10-CM | POA: Diagnosis not present

## 2016-12-26 DIAGNOSIS — C541 Malignant neoplasm of endometrium: Secondary | ICD-10-CM | POA: Diagnosis not present

## 2016-12-26 DIAGNOSIS — I10 Essential (primary) hypertension: Secondary | ICD-10-CM | POA: Diagnosis not present

## 2016-12-26 DIAGNOSIS — Z510 Encounter for antineoplastic radiation therapy: Secondary | ICD-10-CM | POA: Diagnosis not present

## 2016-12-26 DIAGNOSIS — Z79899 Other long term (current) drug therapy: Secondary | ICD-10-CM | POA: Diagnosis not present

## 2016-12-26 DIAGNOSIS — R1900 Intra-abdominal and pelvic swelling, mass and lump, unspecified site: Secondary | ICD-10-CM | POA: Diagnosis not present

## 2016-12-26 DIAGNOSIS — I890 Lymphedema, not elsewhere classified: Secondary | ICD-10-CM | POA: Diagnosis not present

## 2016-12-26 DIAGNOSIS — R69 Illness, unspecified: Secondary | ICD-10-CM | POA: Diagnosis not present

## 2016-12-26 LAB — CBC
HEMATOCRIT: 37.8 % (ref 35.0–47.0)
HEMOGLOBIN: 13 g/dL (ref 12.0–16.0)
MCH: 34.4 pg — ABNORMAL HIGH (ref 26.0–34.0)
MCHC: 34.2 g/dL (ref 32.0–36.0)
MCV: 100.6 fL — AB (ref 80.0–100.0)
PLATELETS: 177 10*3/uL (ref 150–440)
RBC: 3.76 MIL/uL — ABNORMAL LOW (ref 3.80–5.20)
RDW: 13.6 % (ref 11.5–14.5)
WBC: 6.9 10*3/uL (ref 3.6–11.0)

## 2016-12-27 ENCOUNTER — Ambulatory Visit
Admission: RE | Admit: 2016-12-27 | Discharge: 2016-12-27 | Disposition: A | Discharge status: Home / Self Care | Payer: Medicare HMO | Source: Ambulatory Visit | Attending: Radiation Oncology | Admitting: Radiation Oncology

## 2016-12-27 DIAGNOSIS — I10 Essential (primary) hypertension: Secondary | ICD-10-CM | POA: Diagnosis not present

## 2016-12-27 DIAGNOSIS — E785 Hyperlipidemia, unspecified: Secondary | ICD-10-CM | POA: Diagnosis not present

## 2016-12-27 DIAGNOSIS — I890 Lymphedema, not elsewhere classified: Secondary | ICD-10-CM | POA: Diagnosis not present

## 2016-12-27 DIAGNOSIS — Z7982 Long term (current) use of aspirin: Secondary | ICD-10-CM | POA: Diagnosis not present

## 2016-12-27 DIAGNOSIS — R69 Illness, unspecified: Secondary | ICD-10-CM | POA: Diagnosis not present

## 2016-12-27 DIAGNOSIS — I350 Nonrheumatic aortic (valve) stenosis: Secondary | ICD-10-CM | POA: Diagnosis not present

## 2016-12-27 DIAGNOSIS — Z510 Encounter for antineoplastic radiation therapy: Secondary | ICD-10-CM | POA: Diagnosis not present

## 2016-12-27 DIAGNOSIS — Z79899 Other long term (current) drug therapy: Secondary | ICD-10-CM | POA: Diagnosis not present

## 2016-12-27 DIAGNOSIS — R1900 Intra-abdominal and pelvic swelling, mass and lump, unspecified site: Secondary | ICD-10-CM | POA: Diagnosis not present

## 2016-12-28 ENCOUNTER — Ambulatory Visit
Admission: RE | Admit: 2016-12-28 | Discharge: 2016-12-28 | Disposition: A | Discharge status: Home / Self Care | Payer: Medicare HMO | Source: Ambulatory Visit | Attending: Radiation Oncology | Admitting: Radiation Oncology

## 2016-12-28 DIAGNOSIS — I10 Essential (primary) hypertension: Secondary | ICD-10-CM | POA: Diagnosis not present

## 2016-12-28 DIAGNOSIS — Z79899 Other long term (current) drug therapy: Secondary | ICD-10-CM | POA: Diagnosis not present

## 2016-12-28 DIAGNOSIS — R1900 Intra-abdominal and pelvic swelling, mass and lump, unspecified site: Secondary | ICD-10-CM | POA: Diagnosis not present

## 2016-12-28 DIAGNOSIS — I350 Nonrheumatic aortic (valve) stenosis: Secondary | ICD-10-CM | POA: Diagnosis not present

## 2016-12-28 DIAGNOSIS — I890 Lymphedema, not elsewhere classified: Secondary | ICD-10-CM | POA: Diagnosis not present

## 2016-12-28 DIAGNOSIS — Z510 Encounter for antineoplastic radiation therapy: Secondary | ICD-10-CM | POA: Diagnosis not present

## 2016-12-28 DIAGNOSIS — R69 Illness, unspecified: Secondary | ICD-10-CM | POA: Diagnosis not present

## 2016-12-28 DIAGNOSIS — E785 Hyperlipidemia, unspecified: Secondary | ICD-10-CM | POA: Diagnosis not present

## 2016-12-28 DIAGNOSIS — Z7982 Long term (current) use of aspirin: Secondary | ICD-10-CM | POA: Diagnosis not present

## 2016-12-31 ENCOUNTER — Ambulatory Visit
Admission: RE | Admit: 2016-12-31 | Discharge: 2016-12-31 | Disposition: A | Discharge status: Home / Self Care | Payer: Medicare HMO | Source: Ambulatory Visit | Attending: Radiation Oncology | Admitting: Radiation Oncology

## 2016-12-31 DIAGNOSIS — E785 Hyperlipidemia, unspecified: Secondary | ICD-10-CM | POA: Diagnosis not present

## 2016-12-31 DIAGNOSIS — R1900 Intra-abdominal and pelvic swelling, mass and lump, unspecified site: Secondary | ICD-10-CM | POA: Diagnosis not present

## 2016-12-31 DIAGNOSIS — I10 Essential (primary) hypertension: Secondary | ICD-10-CM | POA: Diagnosis not present

## 2016-12-31 DIAGNOSIS — I350 Nonrheumatic aortic (valve) stenosis: Secondary | ICD-10-CM | POA: Diagnosis not present

## 2016-12-31 DIAGNOSIS — Z79899 Other long term (current) drug therapy: Secondary | ICD-10-CM | POA: Diagnosis not present

## 2016-12-31 DIAGNOSIS — Z510 Encounter for antineoplastic radiation therapy: Secondary | ICD-10-CM | POA: Diagnosis not present

## 2016-12-31 DIAGNOSIS — R69 Illness, unspecified: Secondary | ICD-10-CM | POA: Diagnosis not present

## 2016-12-31 DIAGNOSIS — I890 Lymphedema, not elsewhere classified: Secondary | ICD-10-CM | POA: Diagnosis not present

## 2016-12-31 DIAGNOSIS — Z7982 Long term (current) use of aspirin: Secondary | ICD-10-CM | POA: Diagnosis not present

## 2017-01-01 ENCOUNTER — Telehealth: Payer: Self-pay

## 2017-01-01 ENCOUNTER — Ambulatory Visit
Admission: RE | Admit: 2017-01-01 | Discharge: 2017-01-01 | Disposition: A | Discharge status: Home / Self Care | Payer: Medicare HMO | Source: Ambulatory Visit | Attending: Radiation Oncology | Admitting: Radiation Oncology

## 2017-01-01 DIAGNOSIS — E785 Hyperlipidemia, unspecified: Secondary | ICD-10-CM | POA: Diagnosis not present

## 2017-01-01 DIAGNOSIS — Z79899 Other long term (current) drug therapy: Secondary | ICD-10-CM | POA: Diagnosis not present

## 2017-01-01 DIAGNOSIS — R1900 Intra-abdominal and pelvic swelling, mass and lump, unspecified site: Secondary | ICD-10-CM | POA: Diagnosis not present

## 2017-01-01 DIAGNOSIS — R69 Illness, unspecified: Secondary | ICD-10-CM | POA: Diagnosis not present

## 2017-01-01 DIAGNOSIS — I350 Nonrheumatic aortic (valve) stenosis: Secondary | ICD-10-CM | POA: Diagnosis not present

## 2017-01-01 DIAGNOSIS — I890 Lymphedema, not elsewhere classified: Secondary | ICD-10-CM | POA: Diagnosis not present

## 2017-01-01 DIAGNOSIS — I10 Essential (primary) hypertension: Secondary | ICD-10-CM | POA: Diagnosis not present

## 2017-01-01 DIAGNOSIS — Z510 Encounter for antineoplastic radiation therapy: Secondary | ICD-10-CM | POA: Diagnosis not present

## 2017-01-01 DIAGNOSIS — Z7982 Long term (current) use of aspirin: Secondary | ICD-10-CM | POA: Diagnosis not present

## 2017-01-01 NOTE — Telephone Encounter (Signed)
  Oncology Nurse Navigator Documentation Per Dr. Theora Gianotti, I do not need to see Melody Soto this Wednesday. She is actively undergoing radiation with Dr. Baruch Gouty. I do need to see her approximately 1 month after she completes external beam radiation therapy and brachytherapy.  She is scheduled to finish radiation 01/29/17. Appointment rescheduled to 03/06/17 at 1030. Educated regarding appointment change with new date/time sent via my chart at her request. Navigator Location: [facility] (01/01/17 1600)   )Navigator Encounter Type: Telephone (01/01/17 1600) Telephone: Appt Confirmation/Clarification;Outgoing Call (01/01/17 1600)                                                  Time Spent with Patient: 15 (01/01/17 1600)

## 2017-01-02 ENCOUNTER — Ambulatory Visit
Admission: RE | Admit: 2017-01-02 | Discharge: 2017-01-02 | Disposition: A | Discharge status: Home / Self Care | Payer: Medicare HMO | Source: Ambulatory Visit | Attending: Radiation Oncology | Admitting: Radiation Oncology

## 2017-01-02 ENCOUNTER — Inpatient Hospital Stay: Payer: Medicare HMO

## 2017-01-02 DIAGNOSIS — I890 Lymphedema, not elsewhere classified: Secondary | ICD-10-CM | POA: Diagnosis not present

## 2017-01-02 DIAGNOSIS — E785 Hyperlipidemia, unspecified: Secondary | ICD-10-CM | POA: Diagnosis not present

## 2017-01-02 DIAGNOSIS — Z79899 Other long term (current) drug therapy: Secondary | ICD-10-CM | POA: Diagnosis not present

## 2017-01-02 DIAGNOSIS — Z510 Encounter for antineoplastic radiation therapy: Secondary | ICD-10-CM | POA: Diagnosis not present

## 2017-01-02 DIAGNOSIS — I350 Nonrheumatic aortic (valve) stenosis: Secondary | ICD-10-CM | POA: Diagnosis not present

## 2017-01-02 DIAGNOSIS — I10 Essential (primary) hypertension: Secondary | ICD-10-CM | POA: Diagnosis not present

## 2017-01-02 DIAGNOSIS — R69 Illness, unspecified: Secondary | ICD-10-CM | POA: Diagnosis not present

## 2017-01-02 DIAGNOSIS — C541 Malignant neoplasm of endometrium: Secondary | ICD-10-CM | POA: Diagnosis not present

## 2017-01-02 DIAGNOSIS — R1900 Intra-abdominal and pelvic swelling, mass and lump, unspecified site: Secondary | ICD-10-CM | POA: Diagnosis not present

## 2017-01-02 DIAGNOSIS — Z7982 Long term (current) use of aspirin: Secondary | ICD-10-CM | POA: Diagnosis not present

## 2017-01-02 LAB — CBC
HCT: 36.4 % (ref 35.0–47.0)
HEMOGLOBIN: 12.4 g/dL (ref 12.0–16.0)
MCH: 34.2 pg — ABNORMAL HIGH (ref 26.0–34.0)
MCHC: 34.1 g/dL (ref 32.0–36.0)
MCV: 100.4 fL — ABNORMAL HIGH (ref 80.0–100.0)
Platelets: 256 10*3/uL (ref 150–440)
RBC: 3.62 MIL/uL — AB (ref 3.80–5.20)
RDW: 13.4 % (ref 11.5–14.5)
WBC: 6 10*3/uL (ref 3.6–11.0)

## 2017-01-05 ENCOUNTER — Ambulatory Visit: Payer: Medicare HMO

## 2017-01-07 ENCOUNTER — Ambulatory Visit
Admission: RE | Admit: 2017-01-07 | Discharge: 2017-01-07 | Disposition: A | Discharge status: Home / Self Care | Payer: Medicare HMO | Source: Ambulatory Visit | Attending: Radiation Oncology | Admitting: Radiation Oncology

## 2017-01-07 DIAGNOSIS — I350 Nonrheumatic aortic (valve) stenosis: Secondary | ICD-10-CM | POA: Diagnosis not present

## 2017-01-07 DIAGNOSIS — E785 Hyperlipidemia, unspecified: Secondary | ICD-10-CM | POA: Diagnosis not present

## 2017-01-07 DIAGNOSIS — R69 Illness, unspecified: Secondary | ICD-10-CM | POA: Diagnosis not present

## 2017-01-07 DIAGNOSIS — Z79899 Other long term (current) drug therapy: Secondary | ICD-10-CM | POA: Diagnosis not present

## 2017-01-07 DIAGNOSIS — I10 Essential (primary) hypertension: Secondary | ICD-10-CM | POA: Diagnosis not present

## 2017-01-07 DIAGNOSIS — Z510 Encounter for antineoplastic radiation therapy: Secondary | ICD-10-CM | POA: Diagnosis not present

## 2017-01-07 DIAGNOSIS — I890 Lymphedema, not elsewhere classified: Secondary | ICD-10-CM | POA: Diagnosis not present

## 2017-01-07 DIAGNOSIS — Z7982 Long term (current) use of aspirin: Secondary | ICD-10-CM | POA: Diagnosis not present

## 2017-01-07 DIAGNOSIS — R1900 Intra-abdominal and pelvic swelling, mass and lump, unspecified site: Secondary | ICD-10-CM | POA: Diagnosis not present

## 2017-01-08 ENCOUNTER — Ambulatory Visit: Payer: Medicare HMO

## 2017-01-08 DIAGNOSIS — I890 Lymphedema, not elsewhere classified: Secondary | ICD-10-CM | POA: Insufficient documentation

## 2017-01-09 ENCOUNTER — Ambulatory Visit
Admission: RE | Admit: 2017-01-09 | Discharge: 2017-01-09 | Disposition: A | Discharge status: Home / Self Care | Payer: Medicare HMO | Source: Ambulatory Visit | Attending: Radiation Oncology | Admitting: Radiation Oncology

## 2017-01-09 DIAGNOSIS — R1900 Intra-abdominal and pelvic swelling, mass and lump, unspecified site: Secondary | ICD-10-CM | POA: Diagnosis not present

## 2017-01-09 DIAGNOSIS — I10 Essential (primary) hypertension: Secondary | ICD-10-CM | POA: Diagnosis not present

## 2017-01-09 DIAGNOSIS — R69 Illness, unspecified: Secondary | ICD-10-CM | POA: Diagnosis not present

## 2017-01-09 DIAGNOSIS — Z7982 Long term (current) use of aspirin: Secondary | ICD-10-CM | POA: Diagnosis not present

## 2017-01-09 DIAGNOSIS — I890 Lymphedema, not elsewhere classified: Secondary | ICD-10-CM | POA: Diagnosis not present

## 2017-01-09 DIAGNOSIS — I350 Nonrheumatic aortic (valve) stenosis: Secondary | ICD-10-CM | POA: Diagnosis not present

## 2017-01-09 DIAGNOSIS — Z79899 Other long term (current) drug therapy: Secondary | ICD-10-CM | POA: Diagnosis not present

## 2017-01-09 DIAGNOSIS — E785 Hyperlipidemia, unspecified: Secondary | ICD-10-CM | POA: Diagnosis not present

## 2017-01-09 DIAGNOSIS — Z510 Encounter for antineoplastic radiation therapy: Secondary | ICD-10-CM | POA: Diagnosis not present

## 2017-01-10 ENCOUNTER — Ambulatory Visit: Payer: Medicare HMO

## 2017-01-10 ENCOUNTER — Ambulatory Visit
Admission: RE | Admit: 2017-01-10 | Discharge: 2017-01-10 | Disposition: A | Discharge status: Home / Self Care | Payer: Medicare HMO | Source: Ambulatory Visit | Attending: Radiation Oncology | Admitting: Radiation Oncology

## 2017-01-10 DIAGNOSIS — Z7982 Long term (current) use of aspirin: Secondary | ICD-10-CM | POA: Diagnosis not present

## 2017-01-10 DIAGNOSIS — R1900 Intra-abdominal and pelvic swelling, mass and lump, unspecified site: Secondary | ICD-10-CM | POA: Diagnosis not present

## 2017-01-10 DIAGNOSIS — I350 Nonrheumatic aortic (valve) stenosis: Secondary | ICD-10-CM | POA: Diagnosis not present

## 2017-01-10 DIAGNOSIS — R69 Illness, unspecified: Secondary | ICD-10-CM | POA: Diagnosis not present

## 2017-01-10 DIAGNOSIS — Z510 Encounter for antineoplastic radiation therapy: Secondary | ICD-10-CM | POA: Diagnosis not present

## 2017-01-10 DIAGNOSIS — Z79899 Other long term (current) drug therapy: Secondary | ICD-10-CM | POA: Diagnosis not present

## 2017-01-10 DIAGNOSIS — I10 Essential (primary) hypertension: Secondary | ICD-10-CM | POA: Diagnosis not present

## 2017-01-10 DIAGNOSIS — I890 Lymphedema, not elsewhere classified: Secondary | ICD-10-CM | POA: Diagnosis not present

## 2017-01-10 DIAGNOSIS — E785 Hyperlipidemia, unspecified: Secondary | ICD-10-CM | POA: Diagnosis not present

## 2017-01-11 ENCOUNTER — Ambulatory Visit
Admission: RE | Admit: 2017-01-11 | Discharge: 2017-01-11 | Disposition: A | Discharge status: Home / Self Care | Payer: Medicare HMO | Source: Ambulatory Visit | Attending: Radiation Oncology | Admitting: Radiation Oncology

## 2017-01-11 ENCOUNTER — Ambulatory Visit: Payer: Medicare HMO

## 2017-01-11 DIAGNOSIS — I10 Essential (primary) hypertension: Secondary | ICD-10-CM | POA: Diagnosis not present

## 2017-01-11 DIAGNOSIS — I890 Lymphedema, not elsewhere classified: Secondary | ICD-10-CM | POA: Diagnosis not present

## 2017-01-11 DIAGNOSIS — E785 Hyperlipidemia, unspecified: Secondary | ICD-10-CM | POA: Diagnosis not present

## 2017-01-11 DIAGNOSIS — Z79899 Other long term (current) drug therapy: Secondary | ICD-10-CM | POA: Diagnosis not present

## 2017-01-11 DIAGNOSIS — R1900 Intra-abdominal and pelvic swelling, mass and lump, unspecified site: Secondary | ICD-10-CM | POA: Diagnosis not present

## 2017-01-11 DIAGNOSIS — Z510 Encounter for antineoplastic radiation therapy: Secondary | ICD-10-CM | POA: Diagnosis not present

## 2017-01-11 DIAGNOSIS — Z7982 Long term (current) use of aspirin: Secondary | ICD-10-CM | POA: Diagnosis not present

## 2017-01-11 DIAGNOSIS — I350 Nonrheumatic aortic (valve) stenosis: Secondary | ICD-10-CM | POA: Diagnosis not present

## 2017-01-11 DIAGNOSIS — R69 Illness, unspecified: Secondary | ICD-10-CM | POA: Diagnosis not present

## 2017-01-14 ENCOUNTER — Ambulatory Visit
Admission: RE | Admit: 2017-01-14 | Discharge: 2017-01-14 | Disposition: A | Discharge status: Home / Self Care | Payer: Medicare HMO | Source: Ambulatory Visit | Attending: Radiation Oncology | Admitting: Radiation Oncology

## 2017-01-14 ENCOUNTER — Ambulatory Visit: Payer: Medicare HMO

## 2017-01-14 DIAGNOSIS — Z79899 Other long term (current) drug therapy: Secondary | ICD-10-CM | POA: Diagnosis not present

## 2017-01-14 DIAGNOSIS — R69 Illness, unspecified: Secondary | ICD-10-CM | POA: Diagnosis not present

## 2017-01-14 DIAGNOSIS — Z510 Encounter for antineoplastic radiation therapy: Secondary | ICD-10-CM | POA: Diagnosis not present

## 2017-01-14 DIAGNOSIS — E785 Hyperlipidemia, unspecified: Secondary | ICD-10-CM | POA: Diagnosis not present

## 2017-01-14 DIAGNOSIS — I890 Lymphedema, not elsewhere classified: Secondary | ICD-10-CM | POA: Diagnosis not present

## 2017-01-14 DIAGNOSIS — I350 Nonrheumatic aortic (valve) stenosis: Secondary | ICD-10-CM | POA: Diagnosis not present

## 2017-01-14 DIAGNOSIS — R1900 Intra-abdominal and pelvic swelling, mass and lump, unspecified site: Secondary | ICD-10-CM | POA: Diagnosis not present

## 2017-01-14 DIAGNOSIS — I10 Essential (primary) hypertension: Secondary | ICD-10-CM | POA: Diagnosis not present

## 2017-01-14 DIAGNOSIS — Z7982 Long term (current) use of aspirin: Secondary | ICD-10-CM | POA: Diagnosis not present

## 2017-01-15 ENCOUNTER — Ambulatory Visit: Payer: Medicare HMO

## 2017-01-15 ENCOUNTER — Ambulatory Visit
Admission: RE | Admit: 2017-01-15 | Discharge: 2017-01-15 | Disposition: A | Discharge status: Home / Self Care | Payer: Medicare HMO | Source: Ambulatory Visit | Attending: Radiation Oncology | Admitting: Radiation Oncology

## 2017-01-15 DIAGNOSIS — E785 Hyperlipidemia, unspecified: Secondary | ICD-10-CM | POA: Diagnosis not present

## 2017-01-15 DIAGNOSIS — R1900 Intra-abdominal and pelvic swelling, mass and lump, unspecified site: Secondary | ICD-10-CM | POA: Diagnosis not present

## 2017-01-15 DIAGNOSIS — I350 Nonrheumatic aortic (valve) stenosis: Secondary | ICD-10-CM | POA: Diagnosis not present

## 2017-01-15 DIAGNOSIS — I890 Lymphedema, not elsewhere classified: Secondary | ICD-10-CM | POA: Diagnosis not present

## 2017-01-15 DIAGNOSIS — Z510 Encounter for antineoplastic radiation therapy: Secondary | ICD-10-CM | POA: Diagnosis not present

## 2017-01-15 DIAGNOSIS — Z79899 Other long term (current) drug therapy: Secondary | ICD-10-CM | POA: Diagnosis not present

## 2017-01-15 DIAGNOSIS — I10 Essential (primary) hypertension: Secondary | ICD-10-CM | POA: Diagnosis not present

## 2017-01-15 DIAGNOSIS — Z7982 Long term (current) use of aspirin: Secondary | ICD-10-CM | POA: Diagnosis not present

## 2017-01-15 DIAGNOSIS — R69 Illness, unspecified: Secondary | ICD-10-CM | POA: Diagnosis not present

## 2017-01-16 ENCOUNTER — Ambulatory Visit
Admission: RE | Admit: 2017-01-16 | Discharge: 2017-01-16 | Disposition: A | Discharge status: Home / Self Care | Payer: Medicare HMO | Source: Ambulatory Visit | Attending: Radiation Oncology | Admitting: Radiation Oncology

## 2017-01-16 ENCOUNTER — Ambulatory Visit: Payer: Medicare HMO

## 2017-01-16 DIAGNOSIS — C548 Malignant neoplasm of overlapping sites of corpus uteri: Secondary | ICD-10-CM | POA: Diagnosis not present

## 2017-01-16 DIAGNOSIS — I10 Essential (primary) hypertension: Secondary | ICD-10-CM | POA: Diagnosis not present

## 2017-01-16 DIAGNOSIS — R69 Illness, unspecified: Secondary | ICD-10-CM | POA: Diagnosis not present

## 2017-01-16 DIAGNOSIS — I350 Nonrheumatic aortic (valve) stenosis: Secondary | ICD-10-CM | POA: Diagnosis not present

## 2017-01-16 DIAGNOSIS — Z79899 Other long term (current) drug therapy: Secondary | ICD-10-CM | POA: Diagnosis not present

## 2017-01-16 DIAGNOSIS — N939 Abnormal uterine and vaginal bleeding, unspecified: Secondary | ICD-10-CM | POA: Diagnosis not present

## 2017-01-16 DIAGNOSIS — R1900 Intra-abdominal and pelvic swelling, mass and lump, unspecified site: Secondary | ICD-10-CM | POA: Diagnosis not present

## 2017-01-16 DIAGNOSIS — E785 Hyperlipidemia, unspecified: Secondary | ICD-10-CM | POA: Diagnosis not present

## 2017-01-16 DIAGNOSIS — C541 Malignant neoplasm of endometrium: Secondary | ICD-10-CM | POA: Diagnosis not present

## 2017-01-16 DIAGNOSIS — Z510 Encounter for antineoplastic radiation therapy: Secondary | ICD-10-CM | POA: Diagnosis not present

## 2017-01-16 DIAGNOSIS — I890 Lymphedema, not elsewhere classified: Secondary | ICD-10-CM | POA: Diagnosis not present

## 2017-01-16 DIAGNOSIS — Z923 Personal history of irradiation: Secondary | ICD-10-CM | POA: Diagnosis not present

## 2017-01-16 DIAGNOSIS — Z7982 Long term (current) use of aspirin: Secondary | ICD-10-CM | POA: Diagnosis not present

## 2017-01-17 ENCOUNTER — Ambulatory Visit
Admission: RE | Admit: 2017-01-17 | Discharge: 2017-01-17 | Disposition: A | Discharge status: Home / Self Care | Payer: Medicare HMO | Source: Ambulatory Visit | Attending: Radiation Oncology | Admitting: Radiation Oncology

## 2017-01-17 DIAGNOSIS — I350 Nonrheumatic aortic (valve) stenosis: Secondary | ICD-10-CM | POA: Diagnosis not present

## 2017-01-17 DIAGNOSIS — Z79899 Other long term (current) drug therapy: Secondary | ICD-10-CM | POA: Diagnosis not present

## 2017-01-17 DIAGNOSIS — I10 Essential (primary) hypertension: Secondary | ICD-10-CM | POA: Diagnosis not present

## 2017-01-17 DIAGNOSIS — I890 Lymphedema, not elsewhere classified: Secondary | ICD-10-CM | POA: Diagnosis not present

## 2017-01-17 DIAGNOSIS — E785 Hyperlipidemia, unspecified: Secondary | ICD-10-CM | POA: Diagnosis not present

## 2017-01-17 DIAGNOSIS — Z7982 Long term (current) use of aspirin: Secondary | ICD-10-CM | POA: Diagnosis not present

## 2017-01-17 DIAGNOSIS — Z510 Encounter for antineoplastic radiation therapy: Secondary | ICD-10-CM | POA: Diagnosis not present

## 2017-01-17 DIAGNOSIS — R69 Illness, unspecified: Secondary | ICD-10-CM | POA: Diagnosis not present

## 2017-01-17 DIAGNOSIS — R1900 Intra-abdominal and pelvic swelling, mass and lump, unspecified site: Secondary | ICD-10-CM | POA: Diagnosis not present

## 2017-01-20 ENCOUNTER — Other Ambulatory Visit: Payer: Self-pay | Admitting: Family Medicine

## 2017-01-20 DIAGNOSIS — E785 Hyperlipidemia, unspecified: Secondary | ICD-10-CM

## 2017-01-22 ENCOUNTER — Other Ambulatory Visit: Payer: Self-pay

## 2017-01-22 DIAGNOSIS — I10 Essential (primary) hypertension: Secondary | ICD-10-CM

## 2017-01-22 MED ORDER — TELMISARTAN-HCTZ 80-25 MG PO TABS: 1.0000 | ORAL_TABLET | Freq: Every day | ORAL | 1 refills | Status: DC

## 2017-01-22 NOTE — Telephone Encounter (Signed)
Refill request for Hypertension medication:  Micardis HCT  Last office visit pertaining to hypertension: 08/13/2016  Follow up visit: None indicated  BP Readings from Last 3 Encounters:  11/21/16 136/83  10/31/16 107/69  10/29/16 138/88     Lab Results  Component Value Date   CREATININE 0.87 10/31/2016   BUN 23 (H) 10/31/2016   NA 137 10/31/2016   K 4.1 10/31/2016   CL 101 10/31/2016   CO2 25 10/31/2016

## 2017-02-21 ENCOUNTER — Ambulatory Visit: Payer: Medicare HMO | Admitting: Radiation Oncology

## 2017-02-26 DIAGNOSIS — C541 Malignant neoplasm of endometrium: Secondary | ICD-10-CM | POA: Diagnosis not present

## 2017-02-26 DIAGNOSIS — Z85820 Personal history of malignant melanoma of skin: Secondary | ICD-10-CM | POA: Diagnosis not present

## 2017-02-26 DIAGNOSIS — R918 Other nonspecific abnormal finding of lung field: Secondary | ICD-10-CM | POA: Diagnosis not present

## 2017-02-26 DIAGNOSIS — I10 Essential (primary) hypertension: Secondary | ICD-10-CM | POA: Diagnosis not present

## 2017-02-26 DIAGNOSIS — Z833 Family history of diabetes mellitus: Secondary | ICD-10-CM | POA: Diagnosis not present

## 2017-02-26 DIAGNOSIS — I350 Nonrheumatic aortic (valve) stenosis: Secondary | ICD-10-CM | POA: Diagnosis not present

## 2017-02-26 DIAGNOSIS — R69 Illness, unspecified: Secondary | ICD-10-CM | POA: Diagnosis not present

## 2017-02-26 DIAGNOSIS — Z006 Encounter for examination for normal comparison and control in clinical research program: Secondary | ICD-10-CM | POA: Diagnosis not present

## 2017-02-26 DIAGNOSIS — J811 Chronic pulmonary edema: Secondary | ICD-10-CM | POA: Diagnosis not present

## 2017-02-26 DIAGNOSIS — Z823 Family history of stroke: Secondary | ICD-10-CM | POA: Diagnosis not present

## 2017-02-26 DIAGNOSIS — Z803 Family history of malignant neoplasm of breast: Secondary | ICD-10-CM | POA: Diagnosis not present

## 2017-02-26 DIAGNOSIS — Z6841 Body Mass Index (BMI) 40.0 and over, adult: Secondary | ICD-10-CM | POA: Diagnosis not present

## 2017-02-26 DIAGNOSIS — Z952 Presence of prosthetic heart valve: Secondary | ICD-10-CM | POA: Diagnosis not present

## 2017-02-26 DIAGNOSIS — E785 Hyperlipidemia, unspecified: Secondary | ICD-10-CM | POA: Diagnosis not present

## 2017-02-26 DIAGNOSIS — Z8249 Family history of ischemic heart disease and other diseases of the circulatory system: Secondary | ICD-10-CM | POA: Diagnosis not present

## 2017-02-27 DIAGNOSIS — Z952 Presence of prosthetic heart valve: Secondary | ICD-10-CM | POA: Insufficient documentation

## 2017-02-27 HISTORY — PX: AORTIC VALVE REPLACEMENT: SHX41

## 2017-03-06 ENCOUNTER — Inpatient Hospital Stay: Payer: Medicare HMO

## 2017-03-06 ENCOUNTER — Ambulatory Visit: Payer: Medicare HMO | Admitting: Radiation Oncology

## 2017-03-12 ENCOUNTER — Encounter: Payer: Self-pay | Admitting: Radiation Oncology

## 2017-03-13 DIAGNOSIS — Z5181 Encounter for therapeutic drug level monitoring: Secondary | ICD-10-CM | POA: Diagnosis not present

## 2017-03-13 DIAGNOSIS — I10 Essential (primary) hypertension: Secondary | ICD-10-CM | POA: Diagnosis not present

## 2017-03-13 DIAGNOSIS — Z79899 Other long term (current) drug therapy: Secondary | ICD-10-CM | POA: Diagnosis not present

## 2017-03-13 DIAGNOSIS — I890 Lymphedema, not elsewhere classified: Secondary | ICD-10-CM | POA: Diagnosis not present

## 2017-03-13 DIAGNOSIS — C548 Malignant neoplasm of overlapping sites of corpus uteri: Secondary | ICD-10-CM | POA: Diagnosis not present

## 2017-03-13 DIAGNOSIS — Z01818 Encounter for other preprocedural examination: Secondary | ICD-10-CM | POA: Diagnosis not present

## 2017-03-13 DIAGNOSIS — C541 Malignant neoplasm of endometrium: Secondary | ICD-10-CM | POA: Diagnosis not present

## 2017-03-13 DIAGNOSIS — Z952 Presence of prosthetic heart valve: Secondary | ICD-10-CM | POA: Diagnosis not present

## 2017-03-15 DIAGNOSIS — C548 Malignant neoplasm of overlapping sites of corpus uteri: Secondary | ICD-10-CM | POA: Diagnosis not present

## 2017-03-15 DIAGNOSIS — I10 Essential (primary) hypertension: Secondary | ICD-10-CM | POA: Diagnosis not present

## 2017-03-15 DIAGNOSIS — C55 Malignant neoplasm of uterus, part unspecified: Secondary | ICD-10-CM | POA: Diagnosis not present

## 2017-03-15 DIAGNOSIS — Z6841 Body Mass Index (BMI) 40.0 and over, adult: Secondary | ICD-10-CM | POA: Diagnosis not present

## 2017-03-19 DIAGNOSIS — Z6841 Body Mass Index (BMI) 40.0 and over, adult: Secondary | ICD-10-CM | POA: Diagnosis not present

## 2017-03-19 DIAGNOSIS — I10 Essential (primary) hypertension: Secondary | ICD-10-CM | POA: Diagnosis not present

## 2017-03-19 DIAGNOSIS — C548 Malignant neoplasm of overlapping sites of corpus uteri: Secondary | ICD-10-CM | POA: Diagnosis not present

## 2017-03-21 ENCOUNTER — Ambulatory Visit: Payer: Medicare HMO | Admitting: Family Medicine

## 2017-03-21 ENCOUNTER — Encounter: Payer: Self-pay | Admitting: Family Medicine

## 2017-03-21 VITALS — BP 140/70 | HR 75 | Resp 14 | Ht 69.0 in | Wt 292.2 lb

## 2017-03-21 DIAGNOSIS — R69 Illness, unspecified: Secondary | ICD-10-CM | POA: Diagnosis not present

## 2017-03-21 DIAGNOSIS — F330 Major depressive disorder, recurrent, mild: Secondary | ICD-10-CM

## 2017-03-21 DIAGNOSIS — Z23 Encounter for immunization: Secondary | ICD-10-CM | POA: Diagnosis not present

## 2017-03-21 DIAGNOSIS — C541 Malignant neoplasm of endometrium: Secondary | ICD-10-CM

## 2017-03-21 DIAGNOSIS — I10 Essential (primary) hypertension: Secondary | ICD-10-CM

## 2017-03-21 DIAGNOSIS — Z952 Presence of prosthetic heart valve: Secondary | ICD-10-CM

## 2017-03-21 DIAGNOSIS — I890 Lymphedema, not elsewhere classified: Secondary | ICD-10-CM | POA: Diagnosis not present

## 2017-03-21 NOTE — Progress Notes (Signed)
Name: Melody Soto   MRN: [id number]    DOB: [birth year]   Date:03/21/2017       Progress Note  Subjective  Chief Complaint  Chief Complaint  Patient presents with  . Hypertension    HPI  S/p TAVR: she had surgery done at [facility] on 02/27/2017 for severe aortic stenosis. She states she was discharged on Lopressor 12.5 mg twice daily but bp was high at home and she increased dose to 25 mg twice daily, bp since she increased the dose has been better controlled at 120's-130's and no dizziness, chest pain. She has occasional palpitation, but since before surgery . No symptoms of CHF such as orthopnea. She has lymphedema but no change in swelling. She denies change in SOB.   Chronic acquire lymphedema: she would like to see PT for it now.   HTN: better controlled with higher dose of lopressor 25 mg twice daily, off Micardis hctz   Metabolic Syndrome: she denies polyphagia, polydipsia or polyuria, last hgbA1C was at 6.0% back 11/2015, improved 06/2016 - went down to 5.8%. She was doing well on Ozempic but stopped after the endometrial cancer diagnosis, her weight is trending up again. She will let me know when ready to resume therapy   Depression: she took Celexa many years ago, she has a long history of depression ( worse when was separating from her husband), she still has crying spells and sadness that can last 3 days. She states Celexa made her too numb, she had a relapse when diagnosed with cancer 0/2018 but is feeling better, has good support and feels good overall.    Obesity: she is off medication, and has gained weight, she is under brachytherapy, not as active but will resume physical activity and possibly Ozempic when therapy is done  Endometrial cancer FIGO II: she is having brachytherapy at [facility], seeing Dr. Christel Mormon. Tolerating procedure well, even though uncomfortable.    Patient Active Problem List   Diagnosis Date Noted  . S/P TAVR (transcatheter aortic valve replacement)  02/27/2017  . Chronic acquired lymphedema 01/08/2017  . Obesity, morbid, BMI 40.0-49.9 ([place]) 11/13/2016  . FIGO stage II endometrial cancer ([place]) 10/29/2016  . Left ventricular hypertrophy 11/27/2014  . Lymphedema of lower extremity 08/23/2014  . Severe aortic stenosis 08/21/2014  . Benign essential HTN 08/21/2014  . Depression, major, recurrent, mild ([place]) 08/21/2014  . Dyslipidemia 08/21/2014  . Cardiac dysrhythmia 08/21/2014  . Dysmetabolic syndrome 95/62/1308  . Symptomatic menopausal or female climacteric states 08/21/2014  . 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 History of shingles 08/21/2014  . Personal history of skin cancer 11/24/2009    Past Surgical History:  Procedure Laterality Date  . cataract surgery Bilateral 05/24/2014   second eye 06/07/2014  . MELANOMA EXCISION    . OVARIAN CYST REMOVAL     EXPLORATORY LAPAROTOMY  . TONSILLECTOMY      Family History  Problem Relation Age of Onset  . Cancer Mother   . Stroke Mother   . Thyroid nodules Mother   . Diabetes Sister   . Hypertension Sister   . Breast cancer Maternal Aunt   . Bone cancer Maternal Uncle     Social History   Socioeconomic History  . Marital status: Divorced    Spouse name: Not on file  . Number of children: Not on file  . Years of education: Not on file  . Highest education level: Not on file  Social Needs  . Financial resource strain: Not on  file  . Food insecurity - worry: Not on file  . Food insecurity - inability: Not on file  . Transportation needs - medical: Not on file  . Transportation needs - non-medical: Not on file  Occupational History  . Not on file  Tobacco Use  . Smoking status: Never Smoker  . Smokeless tobacco: Never Used  Substance and Sexual Activity  . Alcohol use: Yes    Alcohol/week: 0.0 oz    Comment: Minimal alcohol consumption WINE   . Drug use: No  . Sexual activity: No    Birth control/protection: Post-menopausal  Other Topics Concern  .  Not on file  Social History Narrative  . Not on file     Current Outpatient Medications:  .  aspirin 81 MG chewable tablet, Chew by mouth. PM, Disp: , Rfl:  .  Calcium Carb-Cholecalciferol (CALCIUM 500 + D3) 500-200 MG-UNIT TABS, Take 1 capsule by mouth 2 (two) times daily., Disp: , Rfl:  .  clopidogrel (PLAVIX) 75 MG tablet, Take by mouth., Disp: , Rfl:  .  Coenzyme Q10 (COQ10) 50 MG CAPS, Take 1 tablet by mouth. , Disp: , Rfl:  .  metoprolol tartrate (LOPRESSOR) 25 MG tablet, Take 25 mg twice daily, Disp: , Rfl: 2 .  MULTIPLE VITAMIN PO, Take 2 tablets by mouth 2 (two) times daily. Reported on 05/19/2015, Disp: , Rfl:  .  pravastatin (PRAVACHOL) 20 MG tablet, TAKE 1 TABLET BY MOUTH EVERY DAY, Disp: 90 tablet, Rfl: 0 .  vitamin B-12 (CYANOCOBALAMIN) 1000 MCG tablet, Take 1,000 mcg by mouth daily. , Disp: , Rfl:  .  magnesium oxide (MAG-OX) 400 MG tablet, Take 1 tablet by mouth 2 (two) times daily., Disp: , Rfl:   Allergies  Allergen Reactions  . Ace Inhibitors Other (See Comments)    UNKNOWN REACTION  . Duloxetine     drowsiness     ROS  Constitutional: Negative for fever, positive for  weight change.  Respiratory: Negative for cough and shortness of breath.   Cardiovascular: Negative for chest pain or palpitations.  Gastrointestinal: Negative for abdominal pain, no bowel changes.  Musculoskeletal: Negative for gait problem or joint swelling.  Skin: Negative for rash.  Neurological: Negative for dizziness or headache.  No other specific complaints in a complete review of systems (except as listed in HPI above). Objective  Vitals:   03/21/17 1113  BP: 140/70  Pulse: 75  Resp: 14  SpO2: 95%  Weight: 292 lb 3.2 oz (132.5 kg)  Height: 5\' 9"  (1.753 m)    Body mass index is 43.15 kg/m.  Physical Exam  Constitutional: Patient appears well-developed and well-nourished. Obese No distress.  HEENT: head atraumatic, normocephalic, pupils equal and reactive to light,  neck  supple, throat within normal limits Cardiovascular: Normal rate, with some extra beats. Positive for 2/6 systolic murmur heard. she has lymphedema Pulmonary/Chest: Effort normal and breath sounds normal. No respiratory distress. Abdominal: Soft.  There is no tenderness. Psychiatric: Patient has a normal mood and affect. behavior is normal. Judgment and thought content normal.  Recent Results (from the past 2160 hour(s))  CBC     Status: Abnormal   Collection Time: 12/26/16  2:20 PM  Result Value Ref Range   WBC 6.9 3.6 - 11.0 K/uL   RBC 3.76 (L) 3.80 - 5.20 MIL/uL   Hemoglobin 13.0 12.0 - 16.0 g/dL   HCT 37.8 35.0 - 47.0 %   MCV 100.6 (H) 80.0 - 100.0 fL   MCH 34.4 (H)  26.0 - 34.0 pg   MCHC 34.2 32.0 - 36.0 g/dL   RDW 13.6 11.5 - 14.5 %   Platelets 177 150 - 440 K/uL  CBC     Status: Abnormal   Collection Time: 01/02/17  1:45 PM  Result Value Ref Range   WBC 6.0 3.6 - 11.0 K/uL   RBC 3.62 (L) 3.80 - 5.20 MIL/uL   Hemoglobin 12.4 12.0 - 16.0 g/dL   HCT 36.4 35.0 - 47.0 %   MCV 100.4 (H) 80.0 - 100.0 fL   MCH 34.2 (H) 26.0 - 34.0 pg   MCHC 34.1 32.0 - 36.0 g/dL   RDW 13.4 11.5 - 14.5 %   Platelets 256 150 - 440 K/uL      PHQ2/9: Depression screen [facility] 2/9 11/21/2016 02/24/2015 08/23/2014  Decreased Interest 0 0 0  Down, Depressed, Hopeless 0 0 0  PHQ - 2 Score 0 0 0     Fall Risk: Fall Risk  03/21/2017 11/21/2016 02/24/2015 08/23/2014  Falls in the past year? No No No No     Functional Status Survey: Is the patient deaf or have difficulty hearing?: No Does the patient have difficulty seeing, even when wearing glasses/contacts?: No Does the patient have difficulty concentrating, remembering, or making decisions?: No Does the patient have difficulty walking or climbing stairs?: No Does the patient have difficulty dressing or bathing?: No Does the patient have difficulty doing errands alone such as visiting a doctor's office or shopping?: No    Assessment & Plan  1.  S/P TAVR (transcatheter aortic valve replacement)  - Cardiac rehab evaluation; Future  2. Benign essential HTN  At goal   3. Major depressive disorder, recurrent, mild (HCC)  Refuses medication   4. Chronic acquired lymphedema  - Ambulatory referral to Physical Therapy  5. FIGO stage II endometrial cancer ([place])  Continue follow up with [facility]   6. Obesity, morbid, BMI 40.0-49.9 ([place])  Discussed with the patient the risk posed by an increased BMI. Discussed importance of portion control, calorie counting and at least 150 minutes of physical activity weekly. Avoid sweet beverages and drink more water. Eat at least 6 servings of fruit and vegetables daily   7. Needs flu shot  refused  8. Need for vaccination for pneumococcus  - Pneumococcal polysaccharide vaccine 23-valent greater than or equal to 2yo subcutaneous/IM

## 2017-03-26 ENCOUNTER — Telehealth: Payer: Self-pay

## 2017-03-26 DIAGNOSIS — Z6841 Body Mass Index (BMI) 40.0 and over, adult: Secondary | ICD-10-CM | POA: Diagnosis not present

## 2017-03-26 DIAGNOSIS — I10 Essential (primary) hypertension: Secondary | ICD-10-CM | POA: Diagnosis not present

## 2017-03-26 DIAGNOSIS — C548 Malignant neoplasm of overlapping sites of corpus uteri: Secondary | ICD-10-CM | POA: Diagnosis not present

## 2017-03-26 NOTE — Telephone Encounter (Signed)
Called and spoke with Ms. Nulty regarding her follow up. Dr. Theora Gianotti needs to see her following completion of radiation at [facility], Last treatment 2/19. Appointment changed to 2/27 at 1345 with Dr. Theora Gianotti. Read back performed. Oncology Nurse Navigator Documentation  Navigator Location: [facility] (03/26/17 1600)   )Navigator Encounter Type: Telephone;Follow-up Appt (03/26/17 1600) Telephone: Outgoing Call (03/26/17 1600)                   Patient Visit Type: GynOnc (03/26/17 1600)                              Time Spent with Patient: 15 (03/26/17 1600)

## 2017-03-27 ENCOUNTER — Inpatient Hospital Stay: Payer: Medicare HMO

## 2017-03-27 ENCOUNTER — Ambulatory Visit: Payer: Medicare HMO | Admitting: Radiation Oncology

## 2017-03-29 DIAGNOSIS — Z6841 Body Mass Index (BMI) 40.0 and over, adult: Secondary | ICD-10-CM | POA: Diagnosis not present

## 2017-03-29 DIAGNOSIS — C548 Malignant neoplasm of overlapping sites of corpus uteri: Secondary | ICD-10-CM | POA: Diagnosis not present

## 2017-03-29 DIAGNOSIS — I10 Essential (primary) hypertension: Secondary | ICD-10-CM | POA: Diagnosis not present

## 2017-04-01 DIAGNOSIS — Z952 Presence of prosthetic heart valve: Secondary | ICD-10-CM | POA: Diagnosis not present

## 2017-04-01 DIAGNOSIS — Z48812 Encounter for surgical aftercare following surgery on the circulatory system: Secondary | ICD-10-CM | POA: Diagnosis not present

## 2017-04-01 DIAGNOSIS — I350 Nonrheumatic aortic (valve) stenosis: Secondary | ICD-10-CM | POA: Diagnosis not present

## 2017-04-02 DIAGNOSIS — Z6841 Body Mass Index (BMI) 40.0 and over, adult: Secondary | ICD-10-CM | POA: Diagnosis not present

## 2017-04-02 DIAGNOSIS — C548 Malignant neoplasm of overlapping sites of corpus uteri: Secondary | ICD-10-CM | POA: Diagnosis not present

## 2017-04-02 DIAGNOSIS — I10 Essential (primary) hypertension: Secondary | ICD-10-CM | POA: Diagnosis not present

## 2017-04-08 DIAGNOSIS — I10 Essential (primary) hypertension: Secondary | ICD-10-CM | POA: Diagnosis not present

## 2017-04-08 DIAGNOSIS — Z952 Presence of prosthetic heart valve: Secondary | ICD-10-CM | POA: Diagnosis not present

## 2017-04-08 DIAGNOSIS — E782 Mixed hyperlipidemia: Secondary | ICD-10-CM | POA: Diagnosis not present

## 2017-04-09 NOTE — Progress Notes (Signed)
Gynecologic Oncology Interval Visit   Referring Provider: Will Bonnet,  MD  Chief Concern: Grade 1 endometrial cancer  Subjective:  Melody Soto is a 67 y.o. female G2P2 s/p prior XL ovarian cystectomy for benign disease who is seen in consultation from Dr. Glennon Mac for grade 1 endometrial cancer.  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 She was treated with primary radiation therapy.   She underwent WPRT (45 Gy over 5 weeks) with Dr. Baruch Gouty at [facility] followed by vaginal cuff brachytherapy with Dr. Christel Mormon at [facility]. She completed high-dose rate Brachytherapy to the cervix (treatment 5 of planned 5) on 04/02/2017.    Gynecologic Oncology History Melody Soto is a pleasant female G2P2 s/p prior XL ovarian cystectomy for benign disease who is seen in consultation from Dr. Glennon Mac for grade 1 endometrial cancer.  10/17/2016 Endometrial biopsy and biopsy of cervical polyp  Part A: ENDOMETRIUM, BIOPSY:  WELL DIFFERENTIATED ENDOMETRIAL ADENOCARCINOMA (FIGO I). COMMENT:  THIS CASE WAS REVIEWED IN INTRADEPARTMENTAL CONSULTATION AND THE  DIAGNOSIS REFLECTS OUR CONSENSUS OPINION.DR,JACKSON'S OFFICE WAS INFORMED  OF DIAGNOSIS ON 10/19/16 AT 1.10 PM.  Part B: ENDOCERVIX, POLYP:  MINUTE FRAGMENTS OFOF ADENOCARCINOMA PROBABLY FROM ENDOMETRIUM.  MINUTE FRAGMENTS OF BENIGN ENDOCERVICAL GLANDS, MUCUS, AND BLOOD.  Pap unsatisfactory  10/29/2017 Pelvic ultrasound Uterus 7.3 x 5 x 5 cm with small leiomyomas Bilateral ovaries normal. Endometrial stripe 7.1 mm  CT C/A/P 11/20/2016 IMPRESSION: 1. There is a mildly enlarged right hilar lymph node at 1.1 cm, neck given the that there are no other indicators of metastatic disease, this is likely incidental. No worrisome pulmonary nodules identified. 2. The endometrium is indistinct. Uterine and adnexal contours relatively normal. 3. Asymmetric glandular  tissues in the right upper breast compared to the left. Asymmetry is often normal, but mammographic correlation is suggested. 4. Other imaging findings of potential clinical significance: Aortic Atherosclerosis (ICD10-I70.0). Calcified aortic valve with faint calcification of the mitral valve. Cholelithiasis. Small type 1 hiatal hernia. Descending colon diverticulosis.  She was not a candidate for surgical management due to cardiac disease. She underwent primary radiation.   Problem List: Patient Active Problem List   Diagnosis Date Noted  . Endometrial cancer ([place]) 04/10/2017  . 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 Chronic acquired lymphedema 01/08/2017  . Obesity, morbid, BMI 40.0-49.9 ([place]) 11/13/2016  . FIGO stage II endometrial cancer ([place]) 10/29/2016  . Left ventricular hypertrophy 11/27/2014  . Lymphedema of lower extremity 08/23/2014  . Severe aortic stenosis 08/21/2014  . Benign essential HTN 08/21/2014  . Depression, major, recurrent, mild ([place]) 08/21/2014  . Dyslipidemia 08/21/2014  . Cardiac dysrhythmia 08/21/2014  . Dysmetabolic syndrome 44/04/4740  . Symptomatic menopausal or female climacteric states 08/21/2014  . 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 History of shingles 08/21/2014  . Personal history of skin cancer 11/24/2009    Past Medical History: Past Medical History:  Diagnosis Date  . Aortic stenosis   . Cataract   . Depression   . Endometrial cancer determined by uterine biopsy ([place]) 10/29/2016  . Heart murmur    HX OF  . Hyperlipidemia   . Hypertension    CONTROLLED ON MEDS  . Lymphedema   . Shortness of breath dyspnea     Past Surgical History: Past Surgical History:  Procedure Laterality Date  . 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 cataract surgery Bilateral 05/24/2014   second eye 06/07/2014  .  MELANOMA EXCISION    . OVARIAN CYST REMOVAL     EXPLORATORY LAPAROTOMY  . TONSILLECTOMY       Past Gynecologic History:  Last Menstrual Period:  2016; postmenopausal Last pap: 2018   OB History:  OB History  Gravida Para Term Preterm AB Living  2 2       2   SAB TAB Ectopic Multiple Live Births               # Outcome Date GA Lbr Len/2nd Weight Sex Delivery Anes PTL Lv  2 Para           1 Para             Obstetric Comments  7 pregancies; 2 NSVDs    Family History: Family History  Problem Relation Age of Onset  . Cancer Mother   . Stroke Mother   . Thyroid nodules Mother   . Diabetes Sister   . Hypertension Sister   . Breast cancer Maternal Aunt   . Bone cancer Maternal Uncle     Social History: Social History   Socioeconomic History  . Marital status: Divorced    Spouse name: Not on file  . Number of children: Not on file  . Years of education: Not on file  . Highest education level: Not on file  Social Needs  . Financial resource strain: Not on file  . Food insecurity - worry: Not on file  . Food insecurity - inability: Not on file  . Transportation needs - medical: Not on file  . Transportation needs - non-medical: Not on file  Occupational History  . Not on file  Tobacco Use  . Smoking status: Never Smoker  . Smokeless tobacco: Never Used  Substance and Sexual Activity  . Alcohol use: Yes    Alcohol/week: 0.0 oz    Comment: Minimal alcohol consumption WINE   . Drug use: No  . Sexual activity: No    Birth control/protection: Post-menopausal  Other Topics Concern  . Not on file  Social History Narrative  . Not on file    Allergies: Allergies  Allergen Reactions  . Ace Inhibitors Other (See Comments)    UNKNOWN REACTION  . Duloxetine     drowsiness    Current Medications: Current Outpatient Medications  Medication Sig Dispense Refill  . aspirin 81 MG chewable tablet Chew by mouth. PM    . Calcium Carb-Cholecalciferol (CALCIUM 500 + D3) 500-200 MG-UNIT TABS Take 1 capsule by mouth 2 (two) times daily.    . clopidogrel  (PLAVIX) 75 MG tablet Take by mouth.    . Coenzyme Q10 (COQ10) 50 MG CAPS Take 1 tablet by mouth.     . magnesium oxide (MAG-OX) 400 MG tablet Take 1 tablet by mouth 2 (two) times daily.    . metoprolol tartrate (LOPRESSOR) 25 MG tablet Take 25 mg twice daily  2  . MULTIPLE VITAMIN PO Take 2 tablets by mouth 2 (two) times daily. Reported on 05/19/2015    . pravastatin (PRAVACHOL) 20 MG tablet TAKE 1 TABLET BY MOUTH EVERY DAY 90 tablet 0  . vitamin B-12 (CYANOCOBALAMIN) 1000 MCG tablet Take 1,000 mcg by mouth daily.      No current facility-administered medications for this visit.     Review of Systems General: no complaints  HEENT: no complaints  Lungs: no complaints  Cardiac: no complaints  GI: hemorrhoids  GU: no complaints  Musculoskeletal: bilateral knee pain  Extremities: chronic bilateral leg edema since  her 40's  Skin: no complaints  Neuro: no complaints  Endocrine: no complaints  Psych: depression        Objective:  Physical Examination:  BP 131/82 (BP Location: Right Arm, Patient Position: Sitting)   Pulse 73   Temp 98.4 F (36.9 C) (Tympanic)   Resp 18   Ht 5\' 9"  (1.753 m)   Wt 283 lb (128.4 kg)   SpO2 95%   BMI 41.79 kg/m    ECOG Performance Status: 1 - Symptomatic but completely ambulatory  General appearance: alert, cooperative and appears stated age HEENT: ATNC Lymph node survey: non-palpable, axillary, inguinal, supraclavicular Cardiovascular: RRR Respiratory: B CTA. Abdomen: SNDNT. Obese. Vertical incision all well healed. No hernias, no masses, no ascites.  Extremities: Bilateral lymphedema Skin exam - Well healed incisions. Erythema under pannus.   Neurological exam reveals alert, oriented, normal speech, no focal findings or movement disorder noted   Pelvic: exam chaperoned by nurse;  Vulva: normal appearing vulva with no masses, tenderness or lesions; Vagina: normal vagina;  Cervix: normal except for small 5 mm polyp in the os; Adnexa: unable  to palpate. Uterus: not grossly enlarged and nontender, limited by habitus; Rectal: deferred.    Lab Review Labs on site today: CBC and Chemistry pending  Radiologic Imaging: CXR ordered    Assessment:  Melody Soto is a 67 y.o. female diagnosed with possible stage II grade 1 endometrial cancer with involvement of a cervical polyp s/p primary radiation therapy. NED.   Skin candidiasis.  Enlarged hilar node.   Asymmetric breast tissue.   S/P TAVR (transcatheter aortic valve replacement)   Medical co-morbidities complicating care: left ventricular hypertrophy, severe aortic stenosis, cardiac dysrhythmia, hyperlipidemia,, HTN and obesity.    Plan:   Problem List Items Addressed This Visit      Genitourinary   Endometrial cancer ([place]) - Primary    Skin candidiasis. Enlarged hilar node.  Asymmetric breast tissue.    Ms. Traughber has possible stage II endometrial cancer, grade 1 endometrioid, with cervical involvement. She has several significant comorbidities and was treated with primary radiation therapy. NED on exam. Given vaginal dilator today and recommendations for use.    I have recommended continued close follow up with exams, including pelvic exams every 3-6 months for 2-3 years, then every 6-12 months for 3-5 years and then annually thereafter.  Imaging with PET in 3 months when she sees Dr. Baruch Gouty. Laboratory assessment is based on clinical indication. After her initial surveillance visits we can start to alternate with Dr. Glennon Mac.   Imaging abnormalities: hilar node and asymmetric breast tissue - We will repeat chest CT now due to the increased hilar node and I recommended mammogram given the breast asymmetry.   Nystatin for skin candidiasis.  If all these results are negative for disease then at her next visit we will review patient education for obesity, lifestyle, exercise, nutrition, smoking cessation, sexual health, vaginal lubricants.We will discuss her  weight and need for weight loss, stressed good nutrition, and exercise.  I will provide information regarding calorie counting and exercise for weight loss, and discuss the CARE program.    A total of 90  minutes were spent with the patient/family today; 60% was spent in education, counseling and coordination of care for endometrial cancer.    Gillis Ends, MD    CC:  Will Bonnet,  MD

## 2017-04-10 ENCOUNTER — Inpatient Hospital Stay: Payer: Medicare HMO | Attending: Obstetrics and Gynecology | Admitting: Obstetrics and Gynecology

## 2017-04-10 ENCOUNTER — Ambulatory Visit: Admission: RE | Admit: 2017-04-10 | Payer: Medicare HMO | Source: Ambulatory Visit | Admitting: Radiation Oncology

## 2017-04-10 ENCOUNTER — Ambulatory Visit: Payer: Medicare HMO

## 2017-04-10 ENCOUNTER — Other Ambulatory Visit: Payer: Self-pay | Admitting: Nurse Practitioner

## 2017-04-10 ENCOUNTER — Encounter: Payer: Self-pay | Admitting: Obstetrics and Gynecology

## 2017-04-10 VITALS — BP 131/82 | HR 73 | Temp 98.4°F | Resp 18 | Ht 69.0 in | Wt 283.0 lb

## 2017-04-10 DIAGNOSIS — B372 Candidiasis of skin and nail: Secondary | ICD-10-CM | POA: Insufficient documentation

## 2017-04-10 DIAGNOSIS — I499 Cardiac arrhythmia, unspecified: Secondary | ICD-10-CM | POA: Diagnosis not present

## 2017-04-10 DIAGNOSIS — I119 Hypertensive heart disease without heart failure: Secondary | ICD-10-CM | POA: Insufficient documentation

## 2017-04-10 DIAGNOSIS — C541 Malignant neoplasm of endometrium: Secondary | ICD-10-CM | POA: Diagnosis not present

## 2017-04-10 DIAGNOSIS — E669 Obesity, unspecified: Secondary | ICD-10-CM | POA: Diagnosis not present

## 2017-04-10 DIAGNOSIS — Z952 Presence of prosthetic heart valve: Secondary | ICD-10-CM | POA: Diagnosis not present

## 2017-04-10 DIAGNOSIS — N6489 Other specified disorders of breast: Secondary | ICD-10-CM | POA: Insufficient documentation

## 2017-04-10 DIAGNOSIS — Z923 Personal history of irradiation: Secondary | ICD-10-CM | POA: Insufficient documentation

## 2017-04-10 DIAGNOSIS — R590 Localized enlarged lymph nodes: Secondary | ICD-10-CM | POA: Diagnosis not present

## 2017-04-10 DIAGNOSIS — Z6841 Body Mass Index (BMI) 40.0 and over, adult: Secondary | ICD-10-CM | POA: Insufficient documentation

## 2017-04-10 DIAGNOSIS — N841 Polyp of cervix uteri: Secondary | ICD-10-CM | POA: Insufficient documentation

## 2017-04-10 DIAGNOSIS — I350 Nonrheumatic aortic (valve) stenosis: Secondary | ICD-10-CM | POA: Diagnosis not present

## 2017-04-10 MED ORDER — NYSTATIN 100000 UNIT/GM EX POWD: Freq: Four times a day (QID) | CUTANEOUS | 0 refills | Status: DC

## 2017-04-10 NOTE — Patient Instructions (Signed)
Nystatin topical powder  What is this medicine?  NYSTATIN (nye STAT in) is an antifungal medicine. It is used to treat certain kinds of fungal or yeast infections of the skin.  This medicine may be used for other purposes; ask your health care provider or pharmacist if you have questions.  COMMON BRAND NAME(S): Mycostatin, Nyamyc, Nyata, Nystop, Pedi-Dri  What should I tell my health care provider before I take this medicine?  They need to know if you have any of these conditions:  -an unusual or allergic reaction to nystatin, other foods, dyes or preservatives  -pregnant or trying to get pregnant  -breast-feeding  How should I use this medicine?  This medicine is for external use on the skin only. Follow the directions on the prescription label. Dust the powder on the affected area (or into socks and shoes). If you are treating diaper rash, do not use tight-fitting diapers or plastic pants. Do not get the medicine in your eyes. If you do, rinse out with plenty of cool tap water. Do not breathe in the powder. Do not use your medicine more often than directed. Use your doses at regular intervals. Finish the full course prescribed by your doctor or health care professional even if you think your condition is better. Do not stop using except on the advice of your doctor or health care professional.  Talk to your pediatrician regarding the use of this medicine in children. Special care may be needed.  Overdosage: If you think you have taken too much of this medicine contact a poison control center or emergency room at once.  NOTE: This medicine is only for you. Do not share this medicine with others.  What if I miss a dose?  If you miss a dose, use it as soon as you can. If it is almost time for your next dose, use only that dose. Do not use double or extra doses.  What may interact with this medicine?  Interactions are not expected. Do not use any other skin products on the affected area without telling your doctor or  health care professional.  This list may not describe all possible interactions. Give your health care provider a list of all the medicines, herbs, non-prescription drugs, or dietary supplements you use. Also tell them if you smoke, drink alcohol, or use illegal drugs. Some items may interact with your medicine.  What should I watch for while using this medicine?  Tell your doctor or health care professional if your symptoms do not improve after 3 days.  After bathing make sure that your skin is very dry. Fungal infections like moist conditions. Do not walk around barefoot.  To help prevent reinfection, wear freshly washed cotton, not synthetic, clothing.  What side effects may I notice from receiving this medicine?  Side effects that you should report to your doctor or health care professional as soon as possible:  -allergic reactions like skin rash, itching or hives, swelling of the face, lips, or tongue  Side effects that usually do not require medical attention (report to your doctor or health care professional if they continue or are bothersome):  -skin irritation  This list may not describe all possible side effects. Call your doctor for medical advice about side effects. You may report side effects to FDA at 1-800-FDA-1088.  Where should I keep my medicine?  Keep out of the reach of children.  Store at room temperature between 15 and 30 degrees C (59 and 86 degrees   your doctor, pharmacist, or health care provider.  2018 Elsevier/Gold Standard (2015-03-03 10:36:02)   Skin Yeast Infection Skin yeast infection is a condition in which there is an overgrowth of yeast (candida) that normally lives on the skin. This condition usually occurs in areas of the skin that are constantly warm and moist, such as the armpits or the  groin. What are the causes? This condition is caused by a change in the normal balance of the yeast and bacteria that live on the skin. What increases the risk? This condition is more likely to develop in:  People who are obese.  Pregnant women.  Women who take birth control pills.  People who have diabetes.  People who take antibiotic medicines.  People who take steroid medicines.  People who are malnourished.  People who have a weak defense (immune) system.  People who are 47 years of age or older.  What are the signs or symptoms? Symptoms of this condition include:  A red, swollen area of the skin.  Bumps on the skin.  Itchiness.  How is this diagnosed? This condition is diagnosed with a medical history and physical exam. Your health care provider may check for yeast by taking light scrapings of the skin to be viewed under a microscope. How is this treated? This condition is treated with medicine. Medicines may be prescribed or be available over-the-counter. The medicines may be:  Taken by mouth (orally).  Applied as a cream.  Follow these instructions at home:  Take or apply over-the-counter and prescription medicines only as told by your health care provider.  Eat more yogurt. This may help to keep your yeast infection from returning.  Maintain a healthy weight. If you need help losing weight, talk with your health care provider.  Keep your skin clean and dry.  If you have diabetes, keep your blood sugar under control. Contact a health care provider if:  Your symptoms go away and then return.  Your symptoms do not get better with treatment.  Your symptoms get worse.  Your rash spreads.  You have a fever or chills.  You have new symptoms.  You have new warmth or redness of your skin. This information is not intended to replace advice given to you by your health care provider. Make sure you discuss any questions you have with your health care  provider. Document Released: 10/17/2010 Document Revised: 09/25/2015 Document Reviewed: 08/02/2014 Elsevier Interactive Patient Education  [employer].

## 2017-04-10 NOTE — Progress Notes (Signed)
No new Gyn changes noted today 

## 2017-04-16 ENCOUNTER — Ambulatory Visit
Admission: RE | Admit: 2017-04-16 | Discharge: 2017-04-16 | Disposition: A | Discharge status: Home / Self Care | Payer: Medicare HMO | Source: Ambulatory Visit | Attending: Nurse Practitioner | Admitting: Nurse Practitioner

## 2017-04-16 DIAGNOSIS — Z1231 Encounter for screening mammogram for malignant neoplasm of breast: Secondary | ICD-10-CM | POA: Insufficient documentation

## 2017-04-16 DIAGNOSIS — C541 Malignant neoplasm of endometrium: Secondary | ICD-10-CM

## 2017-04-17 ENCOUNTER — Ambulatory Visit: Payer: Medicare HMO

## 2017-05-01 ENCOUNTER — Ambulatory Visit: Payer: Medicare HMO | Attending: Family Medicine | Admitting: Occupational Therapy

## 2017-05-01 ENCOUNTER — Other Ambulatory Visit: Payer: Self-pay

## 2017-05-01 ENCOUNTER — Ambulatory Visit: Admission: RE | Admit: 2017-05-01 | Payer: Medicare HMO | Source: Ambulatory Visit

## 2017-05-01 ENCOUNTER — Encounter: Payer: Self-pay | Admitting: Occupational Therapy

## 2017-05-01 VITALS — Ht 68.0 in | Wt 283.9 lb

## 2017-05-01 DIAGNOSIS — E785 Hyperlipidemia, unspecified: Secondary | ICD-10-CM

## 2017-05-01 DIAGNOSIS — I890 Lymphedema, not elsewhere classified: Secondary | ICD-10-CM | POA: Diagnosis not present

## 2017-05-01 MED ORDER — PRAVASTATIN SODIUM 20 MG PO TABS: 20.0000 mg | ORAL_TABLET | Freq: Every day | ORAL | 0 refills | Status: DC

## 2017-05-01 NOTE — Patient Instructions (Signed)

## 2017-05-01 NOTE — Telephone Encounter (Signed)
Refill Request for Cholesterol medication. Pravastatin to [facility].   Last visit: 03/21/2017   Lab Results  Component Value Date   CHOL 194 07/02/2016   HDL 49 (L) 07/02/2016   LDLCALC 112 (H) 07/02/2016   TRIG 163 (H) 07/02/2016   CHOLHDL 4.0 07/02/2016    Follow up on 06/24/2017

## 2017-05-05 ENCOUNTER — Other Ambulatory Visit: Payer: Self-pay | Admitting: Family Medicine

## 2017-05-07 ENCOUNTER — Ambulatory Visit: Payer: Medicare HMO | Admitting: Occupational Therapy

## 2017-05-08 ENCOUNTER — Encounter: Payer: Self-pay | Admitting: Occupational Therapy

## 2017-05-08 ENCOUNTER — Ambulatory Visit: Payer: Medicare HMO | Admitting: Occupational Therapy

## 2017-05-08 NOTE — Therapy (Addendum)
[place] MAIN [facility] SERVICES [address] [place], [place], [address] Phone: [phone]   Fax:  [phone]  Occupational Therapy Evaluation  Patient Details  Name: Melody Soto MRN: [id number] Date of Birth: 05/15/1950 Referring Provider: Steele Sizer, MD   Encounter Date: 05/01/2017    Past Medical History:  Diagnosis Date  . Aortic stenosis   . Cataract   . Depression   . Endometrial cancer determined by uterine biopsy ([place]) 10/29/2016  . Heart murmur    HX OF  . Hyperlipidemia   . Hypertension    CONTROLLED ON MEDS  . Lymphedema   . Shortness of breath dyspnea     Past Surgical History:  Procedure Laterality Date  . 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 cataract surgery Bilateral 05/24/2014   second eye 06/07/2014  . MELANOMA EXCISION    . OVARIAN CYST REMOVAL     EXPLORATORY LAPAROTOMY  . TONSILLECTOMY      Vitals:   05/01/17 1050  Weight: 283 lb 14.4 oz (128.8 kg)  Height: 5\' 8"  (1.727 m)   Subjective Assessment - 05/08/17 0902    Subjective   Melody Soto is referred ot Occupational Therapy for evaluation and treatment of BLE lymphedema by her PCP, Melody Class, MD. She reports insideous onset of BLE swelling in her early 69's without known precipitating event. She has not undergone lymphedema treatment in the past , and she has been unable to tolerate off the shelf compression stockings Pt denies known family hx of  leg swelling.     Pertinent History  BLE swelling w/ insideous onset in early 40's- no known precipitating event; no known family hx of limb swelling; Transcatheter aortic valve replacement 02/2017;  Hx depression; orbid obesity; HTN; FIGO stage II Endometrial cancer (HCC); external XRT and brachytherapy complete; hx melanoma excision; urinary incontinence,     Limitations  chronic leg pain and swelling, R>L; limits ability to participate in activities requiring standing and walking >  30 minutes, ; contributes to difficulty reaching feet to inspect skin, perform nail and sking care, and bathe feet and distal legs, LB bathing;,contributes. L Chronic leg swelling limits ability to dress and fit LB clothing and street shoes,. Chronic, progressive BLE lymphedema  contributes to impaired body image and depression.    Patient Stated Goals  get the swelling and pain in my legs down so I can walk more and wear normal shoes    Currently in Pain?  Yes    Pain Score  5     Pain Location  Leg    Pain Orientation  Right;Left    Pain Descriptors / Indicators  Heaviness;Aching;Cramping;Tender;Tightness    Pain Type  Chronic pain    Pain Onset  -- > 20 years    Pain Frequency  Intermittent           Subjective Assessment - 05/08/17 0902    Subjective   Melody Soto is referred ot Occupational Therapy for evaluation and treatment of BLE lymphedema by her PCP, Melody Class, MD. She reports insideous onset of BLE swelling in her early 94's without known precipitating event. She has not undergone lymphedema treatment in the past , and she has been unable to tolerate off the shelf compression stockings Pt denies known family hx of  leg swelling.     Pertinent History  BLE swelling w/ insideous onset in early 40's- no known precipitating event; no known family hx of limb swelling; Transcatheter  aortic valve replacement 02/2017;  Hx depression; orbid obesity; HTN; FIGO stage II Endometrial cancer (HCC); external XRT and brachytherapy complete; hx melanoma excision; urinary incontinence,     Limitations  chronic leg pain and swelling, R>L; limits ability to participate in activities requiring standing and walking > 30 minutes, ; contributes to difficulty reaching feet to inspect skin, perform nail and sking care, and bathe feet and distal legs, LB bathing;,contributes. L Chronic leg swelling limits ability to dress and fit LB clothing and street shoes,. Chronic, progressive BLE lymphedema   contributes to impaired body image and depression.    Patient Stated Goals  get the swelling and pain in my legs down so I can walk more and wear normal shoes    Currently in Pain?  Yes    Pain Score  5     Pain Location  Leg    Pain Orientation  Right;Left    Pain Descriptors / Indicators  Heaviness;Aching;Cramping;Tender;Tightness    Pain Type  Chronic pain    Pain Onset  -- > 20 years    Pain Frequency  Intermittent       Moderate, Stage II , BLE lymphedema (LE) 2/2 unknown etiology Skin Description Hyper-Keratosis Peau' de Orange Shiny Tight Fibrotic Fatty Doughy Indurated      X Moderatly densev dermal fibrosis @ distal legs and feet X                       Hydration Dry Flaky Erythema Other   x       Color Redness Present Pallor Blanching Hemosiderin Staining Other   pink  x      Odor Malodorous Yeast  Absent      x    Temperature Warm Cool Normal     x    Pitting Edema   1+ 2+ 3+ 4+ Non-pitting        x   Girth Symmetrical Asymmetrical Other Distribution    R>L     Stemmer Sign Positive Negative     Strong + bilaterally     Lymphorrea History Of:  Present Absent   x  Absent      Wounds History Of Present Absent Venous Arterial Pressure Size   denies              Signs of Infection Redness Warmth Erythema Acute Swelling Drainage Borders           absent         Scars Adhesions Hypersensitivity        Sensation Light Touch Deep pressure Hypersensitivty   Present Impaired Present Impaired Present Impaired   In tact   In tact     x  Nails WNL Fungus Present Other     thickened   Hair Growth Symmetrical Asymmetrical   unremarkable     Skin Creases Base of toes Ankle Base of Finger Medial Thigh         x x          [facility] OT Assessment - 05/08/17 0904      Assessment   Medical Diagnosis  Moderate, Stage II, BLE lymphedema, suspect primary etiology with lipo-lymphedema component    Onset Date/Surgical Date  -- > 20 years s/p  onset; reported onset in early 60's    Prior Therapy  none      Precautions   Precautions  Fall    Precaution Comments  Lymphedema Precautions: Cardiac  Restrictions   Weight Bearing Restrictions  No      Balance Screen   Has the patient fallen in the past 6 months  No    Has the patient had a decrease in activity level because of a fear of falling?   Yes    Is the patient reluctant to leave their home because of a fear of falling?   No      Home  Environment   Living Arrangements  Alone    Type of Home  House    Entrance Stairs-Rails  Can reach both    Entrance Stairs-Number of Steps  3    Home Layout  One level    Bathroom Shower/Tub  Tub/Shower unit    Home Equipment  None      Prior Function   Level of Independence  Independent with basic ADLs;Independent with household mobility without device;Independent with community mobility without device;Independent with homemaking with ambulation;Independent with transfers    Vocation  Retired    [employer]  retired Statistician    Leisure  family time, church activities, travel      ADL   Lower Body Bathing  -- unable to reach distal legs and feet to bathe, perform nail     Lower Body Dressing  -- difficulty fitting street shoes and socks 2/2 swelling    Tub/Shower Transfer  -- no grab bars at home    ADL comments  BLE pain and swelling limits LB bathing and dressing, LB skin  and nail care, and skin inspection, limits LB dressing      IADL   Shopping  Takes care of all shopping needs independently    Light Housekeeping  Does personal laundry completely    Meal Prep  Plans, prepares and serves adequate meals independently    Investment banker, corporate own vehicle      Mobility   Mobility Status  -- BLE swelling worsens with standing , walking and dependent p    Mobility Status Comments  difficulty climbing stairs, stepping up onto curbs      Activity Tolerance   Activity Tolerance  Endurance does not  limit participation in activity    Activity Tolerance Comments  leg swelling and associated pain limits Pt's partticipation in any activity requiring standing and or walking > 30 minutes, including basic and instrumental ADLs, productive and leisure activities, socoal participation      Cognition   Overall Cognitive Status  Within Functional Limits for tasks assessed      Posture/Postural Control   Posture/Postural Control  No significant limitations      Sensation   Light Touch  Appears Intact      Coordination   Gross Motor Movements are Fluid and Coordinated  Yes    Fine Motor Movements are Fluid and Coordinated  Yes      AROM   Overall AROM   -- hip, knee and snkle AROM limited by increased girth at joint    Overall AROM Comments  limited by limb girth at ankles, knees and feet      Strength   Overall Strength  Within functional limits for tasks performed      Hand Function   Right Hand Gross Grasp  Functional    Left Hand Gross Grasp  Functional                           OT Long Term Goals - 05/01/17  [place] #1   Title  Pt modified independent w/ lymphedema precautions/prevention principals and using printed reference to limit LE progression and infection risk.    Baseline  Max A    Time  10    Period  Days    Status  New    Target Date  -- 10th OT visit      OT LONG TERM GOAL #2   Title  Lymphedema (LE) management/ self-care: Pt able to apply knee -length gradient compression wraps with extra time and printed reference PRN (modified independence) within 10 visits to achieve optimal limb volume reduction during Intensive Phase CDT.    Baseline  dependent    Time  10    Period  Days    Status  New    Target Date  -- 10th OT visit      OT LONG TERM GOAL #3   Title  Lymphedema (LE) management/ self-care:  Pt to achieve at least 10% limb volume reductions below knees bilaterally during Intensive Phase CDT to limit LE  progression, to improve tissue integrity, to decrease infection risk and to improve functional arm and hand use essential for basic and instrumental ADLs performance.    Baseline  dependent    Time  12    Period  Weeks    Status  New    Target Date  07/30/17      OT LONG TERM GOAL #4   Title  Lymphedema (LE) management/ self-care:  Pt to tolerate daily compression wraps, compression garments and/ or HOS devices in keeping w/ prescribed wear regime within 1 week of issue date of each to progress and retain clinical and functional gains and to limit LE progression.    Baseline  Max A    Time  12    Period  Weeks    Status  New    Target Date  07/30/17      OT LONG TERM GOAL #5   Title  Lymphedema (LE) management/ self-care:  During Management Phase CDT Pt to sustain current limb volumes within 5%, and all other clinical gains achieved during OT treatment independently (to control limb swelling and associated pain, to  limit LE progression, to decrease infection risk and to limit further functional decline.    Baseline  Max A    Time  6    Period  Months    Status  New    Target Date  10/29/17            Plan - 05/08/17 0901    Clinical Impression Statement  Melody Soto is a 67 y o lady presenting with moderate, stage II, bilateral lower extremity (BLE) lymphedema (LE). Melody Soto reports insidious onset without known precipitating event. Pt denies known family hx of lymphedema, but this onset pattern is suspicious for primary etiology is in keeping with late onset lymphedema tarda. Hypertension, obesity, dependent positioning and sedentary lifestyle over past several months are exacerbating factors. Non-pitting, fibrotic, BLE swelling worsens as the day progresses and no longer resolves.  Pt does not utilize compression garments and has not previously undergone Complete Decongestive Therapy (CDT) for LE care. Functional limitations resulting from chronic, progressive LE  include decreased standing, walking and sitting tolerance (>30 min), impaired BLE joint AROM and body asymmetry resulting in impaired balance. BLE LE limits basic and instrumental ADLs performance, participation in productive activities and leisure pursuits, and limits social participation at  home and in the community. Finally, BLE LE contributes to impaired body image and depression. Skilled Occupational Therapy for LE care is medically necessary to limit uncontrolled leg swelling and associated pain, to decrease infection risk, to improve safe ambulation and functional mobility, to improve ADLs performance, to increase ability to perform productive and leisure activities, to increase social participation, to limit falls risk and to improve body image and mood. Without skilled Occupational Therapy for Intensive and Management phase Complete Decongestive Therapy (CDT) and skilled training lymphedema self-care routines, this chronic, progressive condition will worsen and further functional decline is likely.    Occupational performance deficits (Please refer to evaluation for details):  ADL's;IADL's;Work;Leisure;Social Participation;Other body image    Rehab Potential  Good    OT Frequency  3x / week    OT Duration  12 weeks    OT Treatment/Interventions  Self-care/ADL training;Therapeutic exercise;Manual Therapy;Manual lymph drainage;Therapeutic activities;DME and/or AE instruction;Compression bandaging;Other (comment);Patient/family education skin care with low ph lotion and / or castor oil to improve skin hydration and flexibility    Clinical Decision Making  Several treatment options, min-mod task modification necessary    Recommended Other Services  fit with knee length, custom, ccl 2-3, flat knit compression stockings, and knee length HOS devices to facilitate improved lymphatic function and decrease fibrosis formation during HOS    Consulted and Agree with Plan of Care  Patient       Patient will  benefit from skilled therapeutic intervention in order to improve the following deficits and impairments:  Decreased skin integrity, Decreased knowledge of precautions, Decreased knowledge of use of DME, Impaired flexibility, Decreased balance, Decreased mobility, Obesity, Decreased range of motion, Increased edema, Pain  Visit Diagnosis: Lymphedema, not elsewhere classified - Plan: Ot plan of care cert/re-cert    Problem List Patient Active Problem List   Diagnosis Date Noted  . Endometrial cancer ([place]) 04/10/2017  . 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 Chronic acquired lymphedema 01/08/2017  . Obesity, morbid, BMI 40.0-49.9 ([place]) 11/13/2016  . FIGO stage II endometrial cancer ([place]) 10/29/2016  . Left ventricular hypertrophy 11/27/2014  . Lymphedema of lower extremity 08/23/2014  . Severe aortic stenosis 08/21/2014  . Benign essential HTN 08/21/2014  . Depression, major, recurrent, mild ([place]) 08/21/2014  . Dyslipidemia 08/21/2014  . Cardiac dysrhythmia 08/21/2014  . Dysmetabolic syndrome 02/20/3233  . Symptomatic menopausal or female climacteric states 08/21/2014  . 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 History of shingles 08/21/2014  . Personal history of skin cancer 11/24/2009    Plan - 05/08/17 0901    Clinical Impression Statement  Melody Soto is a 24 y o lady presenting with moderate, stage II, bilateral lower extremity (BLE) lymphedema (LE). Melody Soto reports insidious onset without known precipitating event. Pt denies known family hx of lymphedema, but this onset pattern is suspicious for primary etiology is in keeping with late onset lymphedema tarda. Hypertension, obesity, dependent positioning and sedentary lifestyle over past several months are exacerbating factors. Non-pitting, fibrotic, BLE swelling worsens as the day progresses and no longer resolves.  Pt does not utilize compression garments and has not previously  undergone Complete Decongestive Therapy (CDT) for LE care. Functional limitations resulting from chronic, progressive LE include decreased standing, walking and sitting tolerance (>30 min), impaired BLE joint AROM and body asymmetry resulting in impaired balance. BLE LE limits basic and instrumental ADLs performance, participation in productive activities and leisure pursuits, and limits social participation at home and in the community.  Finally, BLE LE contributes to impaired body image and depression. Skilled Occupational Therapy for LE care is medically necessary to limit uncontrolled leg swelling and associated pain, to decrease infection risk, to improve safe ambulation and functional mobility, to improve ADLs performance, to increase ability to perform productive and leisure activities, to increase social participation, to limit falls risk and to improve body image and mood. Without skilled Occupational Therapy for Intensive and Management phase Complete Decongestive Therapy (CDT) and skilled training lymphedema self-care routines, this chronic, progressive condition will worsen and further functional decline is likely.    Occupational performance deficits (Please refer to evaluation for details):  ADL's;IADL's;Work;Leisure;Social Participation;Other body image    Rehab Potential  Good    OT Frequency  3x / week    OT Duration  12 weeks    OT Treatment/Interventions  Self-care/ADL training;Therapeutic exercise;Manual Therapy;Manual lymph drainage;Therapeutic activities;DME and/or AE instruction;Compression bandaging;Other (comment);Patient/family education skin care with low ph lotion and / or castor oil to improve skin hydration and flexibility    Clinical Decision Making  Several treatment options, min-mod task modification necessary    Recommended Other Services  fit with knee length, custom, ccl 2-3, flat knit compression stockings, and knee length HOS devices to facilitate improved lymphatic  function and decrease fibrosis formation during HOS    Consulted and Agree with Plan of Care  Patient       Andrey Spearman, MS, Elnita Maxwell 05/08/17 9:16 AM  [place], [place], [address] Phone: [phone]   Fax:  [phone]  Name: Melody Soto MRN: [id number] Date of Birth: [birth year]

## 2017-05-08 NOTE — Addendum Note (Signed)
Addended by: Ansel Bong on: 05/08/2017 09:20 AM   Modules accepted: Orders

## 2017-05-09 ENCOUNTER — Ambulatory Visit: Payer: Medicare HMO | Admitting: Occupational Therapy

## 2017-05-09 ENCOUNTER — Ambulatory Visit
Admission: RE | Admit: 2017-05-09 | Discharge: 2017-05-09 | Disposition: A | Discharge status: Home / Self Care | Payer: Medicare HMO | Source: Ambulatory Visit | Attending: Obstetrics and Gynecology | Admitting: Obstetrics and Gynecology

## 2017-05-09 DIAGNOSIS — I700 Atherosclerosis of aorta: Secondary | ICD-10-CM | POA: Insufficient documentation

## 2017-05-09 DIAGNOSIS — J439 Emphysema, unspecified: Secondary | ICD-10-CM | POA: Diagnosis not present

## 2017-05-09 DIAGNOSIS — R918 Other nonspecific abnormal finding of lung field: Secondary | ICD-10-CM | POA: Diagnosis not present

## 2017-05-09 DIAGNOSIS — C541 Malignant neoplasm of endometrium: Secondary | ICD-10-CM | POA: Diagnosis not present

## 2017-05-10 ENCOUNTER — Telehealth: Payer: Self-pay

## 2017-05-10 NOTE — Telephone Encounter (Signed)
Called and notified of Chest Ct results. Keep all appointments as scheduled.  IMPRESSION: 1. Stable small scattered mediastinal and hilar lymph nodes. No mass or adenopathy. No new or progressive findings. 2. Stable small pulmonary nodules. Recommend follow-up noncontrast chest CT in 1 year. 3. Stable mild emphysematous changes.    Oncology Nurse Navigator Documentation  Navigator Location: [facility] (05/10/17 1200)   )Navigator Encounter Type: Telephone;Diagnostic Results (05/10/17 1200) Telephone: Outgoing Call;Diagnostic Results (05/10/17 1200)                   Patient Visit Type: GynOnc (05/10/17 1200)                              Time Spent with Patient: 15 (05/10/17 1200)

## 2017-05-13 ENCOUNTER — Ambulatory Visit: Payer: Medicare HMO | Attending: Family Medicine | Admitting: Occupational Therapy

## 2017-05-13 DIAGNOSIS — I890 Lymphedema, not elsewhere classified: Secondary | ICD-10-CM | POA: Insufficient documentation

## 2017-05-13 NOTE — Therapy (Signed)
[place] MAIN [facility] SERVICES [place], [place], [address] Phone: [phone]   Fax:  [phone]  Occupational Therapy Treatment  Patient Details  Name: Melody Soto MRN: [id number] Date of Birth: May 21, 1950 Referring Provider: Steele Sizer, MD   Encounter Date: 05/13/2017    Past Medical History:  Diagnosis Date  . Aortic stenosis   . Cataract   . Depression   . Endometrial cancer determined by uterine biopsy ([place]) 10/29/2016  . Heart murmur    HX OF  . Hyperlipidemia   . Hypertension    CONTROLLED ON MEDS  . Lymphedema   . Shortness of breath dyspnea     Past Surgical History:  Procedure Laterality Date  . 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 cataract surgery Bilateral 05/24/2014   second eye 06/07/2014  . MELANOMA EXCISION    . OVARIAN CYST REMOVAL     EXPLORATORY LAPAROTOMY  . TONSILLECTOMY      There were no vitals filed for this visit.  Subjective Assessment - 05/13/17 1113    Subjective   Ms. Bergey presents for Occupational Therapy visit 2/36 to address BLE with CDT. Pt apploogizes for missing 2 scheduled appointments last week.     Pertinent History  BLE swelling w/ insideous onset in early 40's- no known precipitating event; no known family hx of limb swelling; Transcatheter aortic valve replacement 02/2017;  Hx depression; orbid obesity; HTN; FIGO stage II Endometrial cancer (HCC); external XRT and brachytherapy complete; hx melanoma excision; urinary incontinence,     Limitations  chronic leg pain and swelling, R>L; limits ability to participate in activities requiring standing and walking > 30 minutes, ; contributes to difficulty reaching feet to inspect skin, perform nail and sking care, and bathe feet and distal legs, LB bathing;,contributes. L Chronic leg swelling limits ability to dress and fit LB clothing and street shoes,. Chronic, progressive BLE lymphedema  contributes to impaired  body image and depression.    Patient Stated Goals  get the swelling and pain in my legs down so I can walk more and wear normal shoes    Currently in Pain?  Yes  (Pended)     Pain Score  5   (Pended)     Pain Location  Hip  (Pended)     Pain Orientation  Left  (Pended)     Pain Descriptors / Indicators  --  (Pended)  joint pain    Pain Onset  More than a month ago  (Pended)  > 20 years    Pain Frequency  Other (Comment)  (Pended)  when moving          LYMPHEDEMA/ONCOLOGY QUESTIONNAIRE - 05/13/17 1552      Lymphedema Assessments   Lymphedema Assessments  Lower extremities      Right Lower Extremity Lymphedema   Other  RLE limb volume from ankle to below knee (A_D) = 6166.28 ml. RLE limb volume for thigh segement ( E-G) = 11117.70 ml. RLE Full leg ankle to groin volume (A-G) measures 17283.98 ml.    Other  Limb Volume Differential (LVD) for all segments measure as follows: A-D= 0.19%, R>L; LVD E-G= 1.8%; A-G LVD = 1.56%, R>L. Essentially both legs are very similarly swollen with dominant R side more painful and dense, and slightly greater in size.       Left Lower Extremity Lymphedema   Other  LLE limb volume from ankle to below knee (A_D) =6094.37 ml. LLE limb volume for  thigh segement ( E-G) =10920.10 ml. RLE Full leg ankle to groin volume (A-G) measures 17014.47 ml.              OT Treatments/Exercises (OP) - 05/13/17 0001      ADLs   ADL Education Given  Yes      Manual Therapy   Manual Therapy  Edema management;Compression Bandaging    Edema Management  Baseline BLE comparaticve limb vollumetrics    Compression Bandaging  LLE compression wraps applied from toes to below knee: Toe wrap to foot and digits 1-3 using Transelast Classic. Cotton stockinett under  8 cm x 5 m x 1 to foot and ankle, then 10 cm  x 5 m x 2 applied circumferentially in custommary layered gradient configuration. All wraps applied over  over  0.4 cm Rosidal foam to below knee.              OT Education - 05/13/17 1114    Education Details  Continued skilled Pt/caregiver education  And LE ADL training throughout visit for lymphedema self care/ home program, including compression wrapping, compression garment and device wear/care, lymphatic pumping ther ex, simple self-MLD, and skin care. Discussed progress towards goals.     Person(s) Educated  Patient    Methods  Explanation;Demonstration;Handout    Comprehension  Verbalized understanding;Returned demonstration          OT Long Term Goals - 05/01/17 1312      OT LONG TERM GOAL #1   Title  Pt modified independent w/ lymphedema precautions/prevention principals and using printed reference to limit LE progression and infection risk.    Baseline  Max A    Time  10    Period  Days    Status  New    Target Date  -- 10th OT visit      OT LONG TERM GOAL #2   Title  Lymphedema (LE) management/ self-care: Pt able to apply knee -length gradient compression wraps with extra time and printed reference PRN (modified independence) within 10 visits to achieve optimal limb volume reduction during Intensive Phase CDT.    Baseline  dependent    Time  10    Period  Days    Status  New    Target Date  -- 10th OT visit      OT LONG TERM GOAL #3   Title  Lymphedema (LE) management/ self-care:  Pt to achieve at least 10% limb volume reductions below knees bilaterally during Intensive Phase CDT to limit LE progression, to improve tissue integrity, to decrease infection risk and to improve functional arm and hand use essential for basic and instrumental ADLs performance.    Baseline  dependent    Time  12    Period  Weeks    Status  New    Target Date  07/30/17      OT LONG TERM GOAL #4   Title  Lymphedema (LE) management/ self-care:  Pt to tolerate daily compression wraps, compression garments and/ or HOS devices in keeping w/ prescribed wear regime within 1 week of issue date of each to progress and retain clinical and  functional gains and to limit LE progression.    Baseline  Max A    Time  12    Period  Weeks    Status  New    Target Date  07/30/17      OT LONG TERM GOAL #5   Title  Lymphedema (LE) management/ self-care:  During Management  Phase CDT Pt to sustain current limb volumes within 5%, and all other clinical gains achieved during OT treatment independently (to control limb swelling and associated pain, to  limit LE progression, to decrease infection risk and to limit further functional decline.    Baseline  Max A    Time  6    Period  Months    Status  New    Target Date  10/29/17            Plan - 05/13/17 1559    Clinical Impression Statement   Pt completed Lymkphedema Life Impact Scale (LLIS) assessment with baseline score of 32. This score is congruent with 47% perceived impairment in daily tasks resulting from lymphedema in the last week. Completed baseline limb volumetrics bilaterally. Limb Volume Differential (LVD) for all segments measure as follows: A-D= 0.19%, R>L; LVD E-G= 1.8%; A-G LVD = 1.56%, R>L. Essentially both legs are very similarly swollen with dominant R side more painful and dense, and slightly greater in size.Applied 5 layer gradient compression wrap from toes to below knee to introduce Pt to compression without overwhelming her the first day of treatment. We agreed to try 1 week with knee length compression on the RLE and then checking measurements for changes above the knee next week to see if thigh will respond to treatment adequately without need for bulky wraps. Productive session today. Emphasis on Pt LE ADL training next visit to ensure she is able to apply compression wraps correctly without sassistance.     Occupational performance deficits (Please refer to evaluation for details):  ADL's;IADL's;Work;Leisure;Social Participation;Other body image    Rehab Potential  Good    OT Frequency  3x / week    OT Duration  12 weeks    OT Treatment/Interventions   Self-care/ADL training;Therapeutic exercise;Manual Therapy;Manual lymph drainage;Therapeutic activities;DME and/or AE instruction;Compression bandaging;Other (comment);Patient/family education skin care with low ph lotion and / or castor oil to improve skin hydration and flexibility    Clinical Decision Making  Several treatment options, min-mod task modification necessary    Recommended Other Services  fit with knee length, custom, ccl 2-3, flat knit compression stockings, and knee length HOS devices to facilitate improved lymphatic function and decrease fibrosis formation during HOS    Consulted and Agree with Plan of Care  Patient       Patient will benefit from skilled therapeutic intervention in order to improve the following deficits and impairments:  Decreased skin integrity, Decreased knowledge of precautions, Decreased knowledge of use of DME, Impaired flexibility, Decreased balance, Decreased mobility, Obesity, Decreased range of motion, Increased edema, Pain  Visit Diagnosis: Lymphedema, not elsewhere classified    Problem List Patient Active Problem List   Diagnosis Date Noted  . Endometrial cancer ([place]) 04/10/2017  . 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 Chronic acquired lymphedema 01/08/2017  . Obesity, morbid, BMI 40.0-49.9 ([place]) 11/13/2016  . FIGO stage II endometrial cancer ([place]) 10/29/2016  . Left ventricular hypertrophy 11/27/2014  . Lymphedema of lower extremity 08/23/2014  . Severe aortic stenosis 08/21/2014  . Benign essential HTN 08/21/2014  . Depression, major, recurrent, mild ([place]) 08/21/2014  . Dyslipidemia 08/21/2014  . Cardiac dysrhythmia 08/21/2014  . Dysmetabolic syndrome 40/98/1191  . Symptomatic menopausal or female climacteric states 08/21/2014  . 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 History of shingles 08/21/2014  . Personal history of skin cancer 11/24/2009    Ansel Bong 05/13/2017, 4:07 PM  [place]  MAIN [facility] SERVICES [place], [place], [address] Phone: [phone]   Fax:  [phone]  Name: MYRIKAL MESSMER MRN: [id number] Date of Birth: [birth year]

## 2017-05-13 NOTE — Therapy (Signed)
[place] MAIN [facility] SERVICES [place], [place], [address] Phone: [phone]   Fax:  [phone]  Occupational Therapy Treatment  Patient Details  Name: Melody Soto MRN: [id number] Date of Birth: 1950/08/18 Referring Provider: Steele Sizer, MD   Encounter Date: 05/13/2017    Past Medical History:  Diagnosis Date  . Aortic stenosis   . Cataract   . Depression   . Endometrial cancer determined by uterine biopsy ([place]) 10/29/2016  . Heart murmur    HX OF  . Hyperlipidemia   . Hypertension    CONTROLLED ON MEDS  . Lymphedema   . Shortness of breath dyspnea     Past Surgical History:  Procedure Laterality Date  . 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 cataract surgery Bilateral 05/24/2014   second eye 06/07/2014  . MELANOMA EXCISION    . OVARIAN CYST REMOVAL     EXPLORATORY LAPAROTOMY  . TONSILLECTOMY      There were no vitals filed for this visit.  Subjective Assessment - 05/13/17 1113    Subjective   Ms. Mccorkle presents for Occupational Therapy visit 2/36 to address BLE with CDT. Pt apploogizes for missing 2 scheduled appointments last week.     Pertinent History  BLE swelling w/ insideous onset in early 40's- no known precipitating event; no known family hx of limb swelling; Transcatheter aortic valve replacement 02/2017;  Hx depression; orbid obesity; HTN; FIGO stage II Endometrial cancer (HCC); external XRT and brachytherapy complete; hx melanoma excision; urinary incontinence,     Limitations  chronic leg pain and swelling, R>L; limits ability to participate in activities requiring standing and walking > 30 minutes, ; contributes to difficulty reaching feet to inspect skin, perform nail and sking care, and bathe feet and distal legs, LB bathing;,contributes. L Chronic leg swelling limits ability to dress and fit LB clothing and street shoes,. Chronic, progressive BLE lymphedema  contributes to impaired  body image and depression.    Special Tests  Lymphedema Life Impact Scale (LLIS) score- baseline = 32, which translates to 47% iperceived impairement due to lymphedema over the past week    Patient Stated Goals  get the swelling and pain in my legs down so I can walk more and wear normal shoes    Currently in Pain?  Yes    Pain Score  5     Pain Location  Hip    Pain Orientation  Left    Pain Descriptors / Indicators  -- joint pain    Pain Onset  More than a month ago > 20 years    Pain Frequency  Other (Comment) when moving          LYMPHEDEMA/ONCOLOGY QUESTIONNAIRE - 05/13/17 1552      Lymphedema Assessments   Lymphedema Assessments  Lower extremities      Right Lower Extremity Lymphedema   Other  RLE limb volume from ankle to below knee (A_D) = 6166.28 ml. RLE limb volume for thigh segement ( E-G) = 11117.70 ml. RLE Full leg ankle to groin volume (A-G) measures 17283.98 ml.    Other  Limb Volume Differential (LVD) for all segments measure as follows: A-D= 0.19%, R>L; LVD E-G= 1.8%; A-G LVD = 1.56%, R>L. Essentially both legs are very similarly swollen with dominant R side more painful and dense, and slightly greater in size.       Left Lower Extremity Lymphedema   Other  LLE limb volume from ankle to below knee (  A_D) =6094.37 ml. LLE limb volume for thigh segement ( E-G) =10920.10 ml. RLE Full leg ankle to groin volume (A-G) measures 17014.47 ml.              OT Treatments/Exercises (OP) - 05/13/17 0001      ADLs   ADL Education Given  Yes      Manual Therapy   Manual Therapy  Edema management;Compression Bandaging    Edema Management  Baseline BLE comparaticve limb vollumetrics    Compression Bandaging  LLE compression wraps applied from toes to below knee: Toe wrap to foot and digits 1-3 using Transelast Classic. Cotton stockinett under  8 cm x 5 m x 1 to foot and ankle, then 10 cm  x 5 m x 2 applied circumferentially in custommary layered gradient configuration. All  wraps applied over  over  0.4 cm Rosidal foam to below knee.             OT Education - 05/13/17 1114    Education Details  Continued skilled Pt/caregiver education  And LE ADL training throughout visit for lymphedema self care/ home program, including compression wrapping, compression garment and device wear/care, lymphatic pumping ther ex, simple self-MLD, and skin care. Discussed progress towards goals.     Person(s) Educated  Patient    Methods  Explanation;Demonstration;Handout    Comprehension  Verbalized understanding;Returned demonstration          OT Long Term Goals - 05/01/17 1312      OT LONG TERM GOAL #1   Title  Pt modified independent w/ lymphedema precautions/prevention principals and using printed reference to limit LE progression and infection risk.    Baseline  Max A    Time  10    Period  Days    Status  New    Target Date  -- 10th OT visit      OT LONG TERM GOAL #2   Title  Lymphedema (LE) management/ self-care: Pt able to apply knee -length gradient compression wraps with extra time and printed reference PRN (modified independence) within 10 visits to achieve optimal limb volume reduction during Intensive Phase CDT.    Baseline  dependent    Time  10    Period  Days    Status  New    Target Date  -- 10th OT visit      OT LONG TERM GOAL #3   Title  Lymphedema (LE) management/ self-care:  Pt to achieve at least 10% limb volume reductions below knees bilaterally during Intensive Phase CDT to limit LE progression, to improve tissue integrity, to decrease infection risk and to improve functional arm and hand use essential for basic and instrumental ADLs performance.    Baseline  dependent    Time  12    Period  Weeks    Status  New    Target Date  07/30/17      OT LONG TERM GOAL #4   Title  Lymphedema (LE) management/ self-care:  Pt to tolerate daily compression wraps, compression garments and/ or HOS devices in keeping w/ prescribed wear regime within  1 week of issue date of each to progress and retain clinical and functional gains and to limit LE progression.    Baseline  Max A    Time  12    Period  Weeks    Status  New    Target Date  07/30/17      OT LONG TERM GOAL #5   Title  Lymphedema (LE) management/ self-care:  During Management Phase CDT Pt to sustain current limb volumes within 5%, and all other clinical gains achieved during OT treatment independently (to control limb swelling and associated pain, to  limit LE progression, to decrease infection risk and to limit further functional decline.    Baseline  Max A    Time  6    Period  Months    Status  New    Target Date  10/29/17            Plan - 05/13/17 1559    Clinical Impression Statement   Pt completed Lymkphedema Life Impact Scale (LLIS) assessment with baseline score of 32. This score is congruent with 47% perceived impairment in daily tasks resulting from lymphedema in the last week. Completed baseline limb volumetrics bilaterally. Limb Volume Differential (LVD) for all segments measure as follows: A-D= 0.19%, R>L; LVD E-G= 1.8%; A-G LVD = 1.56%, R>L. Essentially both legs are very similarly swollen with dominant R side more painful and dense, and slightly greater in size.Applied 5 layer gradient compression wrap from toes to below knee to introduce Pt to compression without overwhelming her the first day of treatment. We agreed to try 1 week with knee length compression on the RLE and then checking measurements for changes above the knee next week to see if thigh will respond to treatment adequately without need for bulky wraps. Productive session today. Emphasis on Pt LE ADL training next visit to ensure she is able to apply compression wraps correctly without sassistance.     Occupational performance deficits (Please refer to evaluation for details):  ADL's;IADL's;Work;Leisure;Social Participation;Other body image    Rehab Potential  Good    OT Frequency  3x / week     OT Duration  12 weeks    OT Treatment/Interventions  Self-care/ADL training;Therapeutic exercise;Manual Therapy;Manual lymph drainage;Therapeutic activities;DME and/or AE instruction;Compression bandaging;Other (comment);Patient/family education skin care with low ph lotion and / or castor oil to improve skin hydration and flexibility    Clinical Decision Making  Several treatment options, min-mod task modification necessary    Recommended Other Services  fit with knee length, custom, ccl 2-3, flat knit compression stockings, and knee length HOS devices to facilitate improved lymphatic function and decrease fibrosis formation during HOS    Consulted and Agree with Plan of Care  Patient       Patient will benefit from skilled therapeutic intervention in order to improve the following deficits and impairments:  Decreased skin integrity, Decreased knowledge of precautions, Decreased knowledge of use of DME, Impaired flexibility, Decreased balance, Decreased mobility, Obesity, Decreased range of motion, Increased edema, Pain  Visit Diagnosis: Lymphedema, not elsewhere classified    Problem List Patient Active Problem List   Diagnosis Date Noted  . Endometrial cancer ([place]) 04/10/2017  . 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 Chronic acquired lymphedema 01/08/2017  . Obesity, morbid, BMI 40.0-49.9 ([place]) 11/13/2016  . FIGO stage II endometrial cancer ([place]) 10/29/2016  . Left ventricular hypertrophy 11/27/2014  . Lymphedema of lower extremity 08/23/2014  . Severe aortic stenosis 08/21/2014  . Benign essential HTN 08/21/2014  . Depression, major, recurrent, mild ([place]) 08/21/2014  . Dyslipidemia 08/21/2014  . Cardiac dysrhythmia 08/21/2014  . Dysmetabolic syndrome 02/63/7858  . Symptomatic menopausal or female climacteric states 08/21/2014  . 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 History of shingles 08/21/2014  . Personal history of skin cancer  11/24/2009    Andrey Spearman, MS, OTR/L, [facility] 05/13/17  [place] MAIN [facility] SERVICES [address] [place], [place], [address] Phone: [phone]   Fax:  [phone]  Name: BOB EASTWOOD MRN: [id number] Date of Birth: July 15, 1950

## 2017-05-13 NOTE — Patient Instructions (Signed)

## 2017-05-14 ENCOUNTER — Ambulatory Visit: Payer: Medicare HMO | Admitting: Occupational Therapy

## 2017-05-14 DIAGNOSIS — I890 Lymphedema, not elsewhere classified: Secondary | ICD-10-CM

## 2017-05-14 NOTE — Therapy (Signed)
[place] MAIN [facility] SERVICES [address] [place], [place], [address] Phone: [phone]   Fax:  [phone]  Occupational Therapy Treatment  Patient Details  Name: Melody Soto MRN: [id number] Date of Birth: 1950/08/12 Referring Provider: Steele Sizer, MD   Encounter Date: 05/14/2017  OT End of Session - 05/14/17 1636    Visit Number  3    Number of Visits  36    Date for OT Re-Evaluation  07/30/17    OT Start Time  1115    OT Stop Time  1224    OT Time Calculation (min)  69 min    Activity Tolerance  Patient tolerated treatment well;No increased pain    Behavior During Therapy  WFL for tasks assessed/performed       Past Medical History:  Diagnosis Date  . Aortic stenosis   . Cataract   . Depression   . Endometrial cancer determined by uterine biopsy ([place]) 10/29/2016  . Heart murmur    HX OF  . Hyperlipidemia   . Hypertension    CONTROLLED ON MEDS  . Lymphedema   . Shortness of breath dyspnea     Past Surgical History:  Procedure Laterality Date  . 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 cataract surgery Bilateral 05/24/2014   second eye 06/07/2014  . MELANOMA EXCISION    . OVARIAN CYST REMOVAL     EXPLORATORY LAPAROTOMY  . TONSILLECTOMY      There were no vitals filed for this visit.  Subjective Assessment - 05/14/17 1633    Subjective   [name] presents for Occupational Therapy visit 3/36 to address BLE with CDT. Pt presents with gradient compression wraps applied last session in place. Pt states she had no difficulty tolerating compression. "They feel so good. It's like a deep hug for my leg."    Pertinent History  BLE swelling w/ insideous onset in early 40's- no known precipitating event; no known family hx of limb swelling; Transcatheter aortic valve replacement 02/2017;  Hx depression; orbid obesity; HTN; FIGO stage II Endometrial cancer (HCC); external XRT and brachytherapy complete; hx melanoma  excision; urinary incontinence,     Limitations  chronic leg pain and swelling, R>L; limits ability to participate in activities requiring standing and walking > 30 minutes, ; contributes to difficulty reaching feet to inspect skin, perform nail and sking care, and bathe feet and distal legs, LB bathing;,contributes. L Chronic leg swelling limits ability to dress and fit LB clothing and street shoes,. Chronic, progressive BLE lymphedema  contributes to impaired body image and depression.    Special Tests  Lymphedema Life Impact Scale (LLIS) score- baseline = 32, which translates to 47% iperceived impairement due to lymphedema over the past week    Patient Stated Goals  get the swelling and pain in my legs down so I can walk more and wear normal shoes    Currently in Pain?  No/denies    Pain Onset  More than a month ago > 20 years          LYMPHEDEMA/ONCOLOGY QUESTIONNAIRE - 05/13/17 1552      Lymphedema Assessments   Lymphedema Assessments  Lower extremities      Right Lower Extremity Lymphedema   Other  RLE limb volume from ankle to below knee (A_D) = 6166.28 ml. RLE limb volume for thigh segement ( E-G) = 11117.70 ml. RLE Full leg ankle to groin volume (A-G) measures 17283.98 ml.    Other  Limb  Volume Differential (LVD) for all segments measure as follows: A-D= 0.19%, R>L; LVD E-G= 1.8%; A-G LVD = 1.56%, R>L. Essentially both legs are very similarly swollen with dominant R side more painful and dense, and slightly greater in size.       Left Lower Extremity Lymphedema   Other  LLE limb volume from ankle to below knee (A_D) =6094.37 ml. LLE limb volume for thigh segement ( E-G) =10920.10 ml. RLE Full leg ankle to groin volume (A-G) measures 17014.47 ml.              OT Treatments/Exercises (OP) - 05/14/17 0001      ADLs   ADL Education Given  Yes      Manual Therapy   Manual Therapy  Edema management;Compression Bandaging             OT Education - 05/14/17 1635     Education Details  Pt edu for LE self care- gradient compression wrapping    Person(s) Educated  Patient    Methods  Explanation;Demonstration;Tactile cues;Verbal cues    Comprehension  Verbalized understanding;Returned demonstration;Verbal cues required;Tactile cues required;Need further instruction          OT Long Term Goals - 05/01/17 1312      OT LONG TERM GOAL #1   Title  Pt modified independent w/ lymphedema precautions/prevention principals and using printed reference to limit LE progression and infection risk.    Baseline  Max A    Time  10    Period  Days    Status  New    Target Date  -- 10th OT visit      OT LONG TERM GOAL #2   Title  Lymphedema (LE) management/ self-care: Pt able to apply knee -length gradient compression wraps with extra time and printed reference PRN (modified independence) within 10 visits to achieve optimal limb volume reduction during Intensive Phase CDT.    Baseline  dependent    Time  10    Period  Days    Status  New    Target Date  -- 10th OT visit      OT LONG TERM GOAL #3   Title  Lymphedema (LE) management/ self-care:  Pt to achieve at least 10% limb volume reductions below knees bilaterally during Intensive Phase CDT to limit LE progression, to improve tissue integrity, to decrease infection risk and to improve functional arm and hand use essential for basic and instrumental ADLs performance.    Baseline  dependent    Time  12    Period  Weeks    Status  New    Target Date  07/30/17      OT LONG TERM GOAL #4   Title  Lymphedema (LE) management/ self-care:  Pt to tolerate daily compression wraps, compression garments and/ or HOS devices in keeping w/ prescribed wear regime within 1 week of issue date of each to progress and retain clinical and functional gains and to limit LE progression.    Baseline  Max A    Time  12    Period  Weeks    Status  New    Target Date  07/30/17      OT LONG TERM GOAL #5   Title  Lymphedema (LE)  management/ self-care:  During Management Phase CDT Pt to sustain current limb volumes within 5%, and all other clinical gains achieved during OT treatment independently (to control limb swelling and associated pain, to  limit LE progression, to decrease  infection risk and to limit further functional decline.    Baseline  Max A    Time  6    Period  Months    Status  New    Target Date  10/29/17            Plan - 05/14/17 1636    Clinical Impression Statement   Pt participated in skilled self care training for compression wrapping. By end of session Pt able to apply toe wraps and Rosidol foam using coorrect techniques with mod A, including verbal and physical cues. Visible decrease in RLE limb volume and tiee density when removing wraps applied    24 hours agao. Good initial response.. Cont as per POC.    Occupational performance deficits (Please refer to evaluation for details):  ADL's;IADL's;Work;Leisure;Social Participation;Other body image    Rehab Potential  Good    OT Frequency  3x / week    OT Duration  12 weeks    OT Treatment/Interventions  Self-care/ADL training;Therapeutic exercise;Manual Therapy;Manual lymph drainage;Therapeutic activities;DME and/or AE instruction;Compression bandaging;Other (comment);Patient/family education skin care with low ph lotion and / or castor oil to improve skin hydration and flexibility    Clinical Decision Making  Several treatment options, min-mod task modification necessary    Recommended Other Services  fit with knee length, custom, ccl 2-3, flat knit compression stockings, and knee length HOS devices to facilitate improved lymphatic function and decrease fibrosis formation during HOS    Consulted and Agree with Plan of Care  Patient       Patient will benefit from skilled therapeutic intervention in order to improve the following deficits and impairments:  Decreased skin integrity, Decreased knowledge of precautions, Decreased knowledge of use  of DME, Impaired flexibility, Decreased balance, Decreased mobility, Obesity, Decreased range of motion, Increased edema, Pain  Visit Diagnosis: Lymphedema, not elsewhere classified    Problem List Patient Active Problem List   Diagnosis Date Noted  . Endometrial cancer ([place]) 04/10/2017  . 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 Chronic acquired lymphedema 01/08/2017  . Obesity, morbid, BMI 40.0-49.9 ([place]) 11/13/2016  . FIGO stage II endometrial cancer ([place]) 10/29/2016  . Left ventricular hypertrophy 11/27/2014  . Lymphedema of lower extremity 08/23/2014  . Severe aortic stenosis 08/21/2014  . Benign essential HTN 08/21/2014  . Depression, major, recurrent, mild ([place]) 08/21/2014  . Dyslipidemia 08/21/2014  . Cardiac dysrhythmia 08/21/2014  . Dysmetabolic syndrome 67/01/4579  . Symptomatic menopausal or female climacteric states 08/21/2014  . 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 History of shingles 08/21/2014  . Personal history of skin cancer 11/24/2009    Andrey Spearman, MS, OTR/L, [facility] 05/14/17 4:40 PM   [place] MAIN [facility] SERVICES [address] [place], [place], [address] Phone: [phone]   Fax:  [phone]  Name: Melody Soto MRN: [id number] Date of Birth: May 04, 1950

## 2017-05-16 ENCOUNTER — Ambulatory Visit: Payer: Medicare HMO | Admitting: Occupational Therapy

## 2017-05-16 DIAGNOSIS — I890 Lymphedema, not elsewhere classified: Secondary | ICD-10-CM

## 2017-05-16 NOTE — Therapy (Signed)
[place] MAIN [facility] SERVICES [address] [place], [place], [address] Phone: [phone]   Fax:  [phone]  Occupational Therapy Treatment  Patient Details  Name: Melody Soto MRN: [id number] Date of Birth: Dec 04, 1950 Referring Provider: Steele Sizer, MD   Encounter Date: 05/16/2017  OT End of Session - 05/16/17 1625    Visit Number  4    Number of Visits  36    Date for OT Re-Evaluation  07/30/17    OT Start Time  1110    OT Stop Time  1207    OT Time Calculation (min)  57 min    Activity Tolerance  Patient tolerated treatment well;No increased pain    Behavior During Therapy  WFL for tasks assessed/performed       Past Medical History:  Diagnosis Date  . Aortic stenosis   . Cataract   . Depression   . Endometrial cancer determined by uterine biopsy ([place]) 10/29/2016  . Heart murmur    HX OF  . Hyperlipidemia   . Hypertension    CONTROLLED ON MEDS  . Lymphedema   . Shortness of breath dyspnea     Past Surgical History:  Procedure Laterality Date  . 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 cataract surgery Bilateral 05/24/2014   second eye 06/07/2014  . MELANOMA EXCISION    . OVARIAN CYST REMOVAL     EXPLORATORY LAPAROTOMY  . TONSILLECTOMY      There were no vitals filed for this visit.  Subjective Assessment - 05/16/17 1620    Subjective   Melody Soto presents for Occupational Therapy visit 4/36 to address BLE with CDT. Pt presents without gradient compression wraps in place. OT OKd plan for Pt to shower just befor OT appointments and bring clean, rolled wraps to clinic instead of wrapping at home. Discussed importance of staying wrapped to reduce limb volume in preparation for compression garment   measurement in fitting.     Pertinent History  BLE swelling w/ insideous onset in early 40's- no known precipitating event; no known family hx of limb swelling; Transcatheter aortic valve replacement 02/2017;  Hx  depression; orbid obesity; HTN; FIGO stage II Endometrial cancer (HCC); external XRT and brachytherapy complete; hx melanoma excision; urinary incontinence,     Limitations  chronic leg pain and swelling, R>L; limits ability to participate in activities requiring standing and walking > 30 minutes, ; contributes to difficulty reaching feet to inspect skin, perform nail and sking care, and bathe feet and distal legs, LB bathing;,contributes. L Chronic leg swelling limits ability to dress and fit LB clothing and street shoes,. Chronic, progressive BLE lymphedema  contributes to impaired body image and depression.    Special Tests  Lymphedema Life Impact Scale (LLIS) score- baseline = 32, which translates to 47% iperceived impairement due to lymphedema over the past week    Patient Stated Goals  get the swelling and pain in my legs down so I can walk more and wear normal shoes    Currently in Pain?  No/denies    Pain Onset  More than a month ago > 20 years                   OT Treatments/Exercises (OP) - 05/16/17 0001      ADLs   ADL Education Given  Yes      Manual Therapy   Manual Therapy  Edema management;Compression Bandaging;Manual Lymphatic Drainage (MLD)    Manual Lymphatic Drainage (  MLD)  Commenced MLD to LLE utilizing short neck sequence, deep abdominal breathing, functional healthy inguinal LN, and J strokes to leg segments and foot.     Compression Bandaging  LLE compression wraps applied from toes to below knee: Toe wrap to foot and digits 1-3 using Transelast Classic. Cotton stockinett under  8 cm x 5 m x 1 to foot and ankle, then 10 cm  x 5 m x 2 applied circumferentially in custommary layered gradient configuration. All wraps applied over  over  0.4 cm Rosidal foam to below knee.             OT Education - 05/16/17 1624    Education Details  Continued skilled Pt/caregiver education  And LE ADL training throughout visit for lymphedema self care/ home program,  including compression wrapping, compression garment and device wear/care, lymphatic pumping ther ex, simple self-MLD, and skin care. Discussed progress towards goals.     Person(s) Educated  Patient    Methods  Explanation;Demonstration    Comprehension  Returned demonstration;Verbalized understanding          OT Long Term Goals - 05/01/17 1312      OT LONG TERM GOAL #1   Title  Pt modified independent w/ lymphedema precautions/prevention principals and using printed reference to limit LE progression and infection risk.    Baseline  Max A    Time  10    Period  Days    Status  New    Target Date  -- 10th OT visit      OT LONG TERM GOAL #2   Title  Lymphedema (LE) management/ self-care: Pt able to apply knee -length gradient compression wraps with extra time and printed reference PRN (modified independence) within 10 visits to achieve optimal limb volume reduction during Intensive Phase CDT.    Baseline  dependent    Time  10    Period  Days    Status  New    Target Date  -- 10th OT visit      OT LONG TERM GOAL #3   Title  Lymphedema (LE) management/ self-care:  Pt to achieve at least 10% limb volume reductions below knees bilaterally during Intensive Phase CDT to limit LE progression, to improve tissue integrity, to decrease infection risk and to improve functional arm and hand use essential for basic and instrumental ADLs performance.    Baseline  dependent    Time  12    Period  Weeks    Status  New    Target Date  07/30/17      OT LONG TERM GOAL #4   Title  Lymphedema (LE) management/ self-care:  Pt to tolerate daily compression wraps, compression garments and/ or HOS devices in keeping w/ prescribed wear regime within 1 week of issue date of each to progress and retain clinical and functional gains and to limit LE progression.    Baseline  Max A    Time  12    Period  Weeks    Status  New    Target Date  07/30/17      OT LONG TERM GOAL #5   Title  Lymphedema (LE)  management/ self-care:  During Management Phase CDT Pt to sustain current limb volumes within 5%, and all other clinical gains achieved during OT treatment independently (to control limb swelling and associated pain, to  limit LE progression, to decrease infection risk and to limit further functional decline.    Baseline  Max A  Time  6    Period  Months    Status  New    Target Date  10/29/17            Plan - 05/16/17 1625    Clinical Impression Statement  Reinforced importance if remeining wrapped 23/ 7 for reducing swelling. Pt has not been com0pliant with recommended, Intensive [place] wrapping regime during visit interval. Discussed rational for learning self wrapping again and demonstrated proper gradient technique at end of session. PtCommenced LLE MLD today. Pt tolerated ell without pain. Cont as per POC.    Occupational performance deficits (Please refer to evaluation for details):  ADL's;IADL's;Work;Leisure;Social Participation;Other body image    Rehab Potential  Good    OT Frequency  3x / week    OT Duration  12 weeks    OT Treatment/Interventions  Self-care/ADL training;Therapeutic exercise;Manual Therapy;Manual lymph drainage;Therapeutic activities;DME and/or AE instruction;Compression bandaging;Other (comment);Patient/family education skin care with low ph lotion and / or castor oil to improve skin hydration and flexibility    Clinical Decision Making  Several treatment options, min-mod task modification necessary    Recommended Other Services  fit with knee length, custom, ccl 2-3, flat knit compression stockings, and knee length HOS devices to facilitate improved lymphatic function and decrease fibrosis formation during HOS    Consulted and Agree with Plan of Care  Patient       Patient will benefit from skilled therapeutic intervention in order to improve the following deficits and impairments:  Decreased skin integrity, Decreased knowledge of precautions, Decreased  knowledge of use of DME, Impaired flexibility, Decreased balance, Decreased mobility, Obesity, Decreased range of motion, Increased edema, Pain  Visit Diagnosis: Lymphedema, not elsewhere classified    Problem List Patient Active Problem List   Diagnosis Date Noted  . Endometrial cancer ([place]) 04/10/2017  . 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 Chronic acquired lymphedema 01/08/2017  . Obesity, morbid, BMI 40.0-49.9 ([place]) 11/13/2016  . FIGO stage II endometrial cancer ([place]) 10/29/2016  . Left ventricular hypertrophy 11/27/2014  . Lymphedema of lower extremity 08/23/2014  . Severe aortic stenosis 08/21/2014  . Benign essential HTN 08/21/2014  . Depression, major, recurrent, mild ([place]) 08/21/2014  . Dyslipidemia 08/21/2014  . Cardiac dysrhythmia 08/21/2014  . Dysmetabolic syndrome 75/91/6384  . Symptomatic menopausal or female climacteric states 08/21/2014  . 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 History of shingles 08/21/2014  . Personal history of skin cancer 11/24/2009   Andrey Spearman, MS, OTR/L, [facility] 05/16/17 4:29 PM  [place] MAIN [facility] SERVICES [address] [place], [place], [address] Phone: [phone]   Fax:  [phone]  Name: Melody Soto MRN: [id number] Date of Birth: Jan 17, 1951

## 2017-05-21 ENCOUNTER — Ambulatory Visit: Payer: Medicare HMO | Admitting: Occupational Therapy

## 2017-05-21 DIAGNOSIS — I890 Lymphedema, not elsewhere classified: Secondary | ICD-10-CM

## 2017-05-21 NOTE — Therapy (Signed)
[place] MAIN [facility] SERVICES [address] [place], [place], [address] Phone: [phone]   Fax:  [phone]  Occupational Therapy Treatment  Patient Details  Name: Melody Soto MRN: [id number] Date of Birth: December 09, 1950 Referring Provider: Steele Sizer, MD   Encounter Date: 05/21/2017  OT End of Session - 05/21/17 1219    Visit Number  5  (Pended)     Number of Visits  5  (Pended)     Date for OT Re-Evaluation  07/30/17  (Pended)     OT Start Time  1104  (Pended)     OT Stop Time  1220  (Pended)     OT Time Calculation (min)  76 min  (Pended)     Activity Tolerance  Patient tolerated treatment well;No increased pain  (Pended)     Behavior During Therapy  WFL for tasks assessed/performed  (Pended)        Past Medical History:  Diagnosis Date  . Aortic stenosis   . Cataract   . Depression   . Endometrial cancer determined by uterine biopsy ([place]) 10/29/2016  . Heart murmur    HX OF  . Hyperlipidemia   . Hypertension    CONTROLLED ON MEDS  . Lymphedema   . Shortness of breath dyspnea     Past Surgical History:  Procedure Laterality Date  . 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 cataract surgery Bilateral 05/24/2014   second eye 06/07/2014  . MELANOMA EXCISION    . OVARIAN CYST REMOVAL     EXPLORATORY LAPAROTOMY  . TONSILLECTOMY      There were no vitals filed for this visit.  Subjective Assessment - 05/21/17 1214    Subjective   Melody Soto presents for Occupational Therapy visit 5/36 to address BLE with CDT. Pt presents without gradient compression wraps in place. Pt states, "I tried to wrap this weekend , but it was a mess."     Pertinent History  BLE swelling w/ insideous onset in early 40's- no known precipitating event; no known family hx of limb swelling; Transcatheter aortic valve replacement 02/2017;  Hx depression; orbid obesity; HTN; FIGO stage II Endometrial cancer (HCC); external XRT and  brachytherapy complete; hx melanoma excision; urinary incontinence,     Limitations  chronic leg pain and swelling, R>L; limits ability to participate in activities requiring standing and walking > 30 minutes, ; contributes to difficulty reaching feet to inspect skin, perform nail and sking care, and bathe feet and distal legs, LB bathing;,contributes. L Chronic leg swelling limits ability to dress and fit LB clothing and street shoes,. Chronic, progressive BLE lymphedema  contributes to impaired body image and depression.    Special Tests  Lymphedema Life Impact Scale (LLIS) score- baseline = 32, which translates to 47% iperceived impairement due to lymphedema over the past week    Patient Stated Goals  get the swelling and pain in my legs down so I can walk more and wear normal shoes    Currently in Pain?  No/denies    Pain Onset  More than a month ago > 20 years                   OT Treatments/Exercises (OP) - 05/21/17 0001      ADLs   ADL Education Given  Yes      Manual Therapy   Manual Therapy  Compression Bandaging;Manual Lymphatic Drainage (MLD)    Manual therapy comments  skin care during MLD  w/ low ph castor oil to improve hyration     Manual Lymphatic Drainage (MLD)  MLD to LLE utilizing short neck sequence, deep abdominal breathing, functional healthy inguinal LN, and J strokes to leg segments and foot.     Compression Bandaging  LLE compression wraps applied from toes to below knee: Toe wrap to foot and digits 1-3 using Transelast Classic. Cotton stockinett under  8 cm x 5 m x 1 to foot and ankle, then 10 cm  x 5 m x 2 applied circumferentially in custommary layered gradient configuration. All wraps applied over  over  0.4 cm Rosidal foam to below knee.             OT Education - 05/21/17 1218    Education provided  Yes    Education Details  Continued skilled Pt/caregiver education  And LE ADL training throughout visit for lymphedema self care/ home program,  including compression wrapping, compression garment and device wear/care, lymphatic pumping ther ex, simple self-MLD, and skin care. Discussed progress towards goals.     Person(s) Educated  Patient    Methods  Explanation;Demonstration    Comprehension  Returned demonstration;Verbalized understanding          OT Long Term Goals - 05/01/17 1312      OT LONG TERM GOAL #1   Title  Pt modified independent w/ lymphedema precautions/prevention principals and using printed reference to limit LE progression and infection risk.    Baseline  Max A    Time  10    Period  Days    Status  New    Target Date  -- 10th OT visit      OT LONG TERM GOAL #2   Title  Lymphedema (LE) management/ self-care: Pt able to apply knee -length gradient compression wraps with extra time and printed reference PRN (modified independence) within 10 visits to achieve optimal limb volume reduction during Intensive Phase CDT.    Baseline  dependent    Time  10    Period  Days    Status  New    Target Date  -- 10th OT visit      OT LONG TERM GOAL #3   Title  Lymphedema (LE) management/ self-care:  Pt to achieve at least 10% limb volume reductions below knees bilaterally during Intensive Phase CDT to limit LE progression, to improve tissue integrity, to decrease infection risk and to improve functional arm and hand use essential for basic and instrumental ADLs performance.    Baseline  dependent    Time  12    Period  Weeks    Status  New    Target Date  07/30/17      OT LONG TERM GOAL #4   Title  Lymphedema (LE) management/ self-care:  Pt to tolerate daily compression wraps, compression garments and/ or HOS devices in keeping w/ prescribed wear regime within 1 week of issue date of each to progress and retain clinical and functional gains and to limit LE progression.    Baseline  Max A    Time  12    Period  Weeks    Status  New    Target Date  07/30/17      OT LONG TERM GOAL #5   Title  Lymphedema (LE)  management/ self-care:  During Management Phase CDT Pt to sustain current limb volumes within 5%, and all other clinical gains achieved during OT treatment independently (to control limb swelling and associated pain, to  limit LE progression, to decrease infection risk and to limit further functional decline.    Baseline  Max A    Time  6    Period  Months    Status  New    Target Date  10/29/17            Plan - 05/21/17 1307    Clinical Impression Statement  Pt tolerated MLD, skin care and compression wrapping to LLEto decease limb volume and reduce infection risk without difficulty. Half of session focused on continuing to teach self application of short stretch wraps in gradient patter from toes to knee. Pt required Max A to applywraps usng correct techniques. Cont teaching these skills until Pt si independent.    Occupational performance deficits (Please refer to evaluation for details):  ADL's;IADL's;Work;Leisure;Social Participation;Other body image    Rehab Potential  Good    OT Frequency  3x / week    OT Duration  12 weeks    OT Treatment/Interventions  Self-care/ADL training;Therapeutic exercise;Manual Therapy;Manual lymph drainage;Therapeutic activities;DME and/or AE instruction;Compression bandaging;Other (comment);Patient/family education skin care with low ph lotion and / or castor oil to improve skin hydration and flexibility    Clinical Decision Making  Several treatment options, min-mod task modification necessary    Recommended Other Services  fit with knee length, custom, ccl 2-3, flat knit compression stockings, and knee length HOS devices to facilitate improved lymphatic function and decrease fibrosis formation during HOS    Consulted and Agree with Plan of Care  Patient       Patient will benefit from skilled therapeutic intervention in order to improve the following deficits and impairments:  Decreased skin integrity, Decreased knowledge of precautions, Decreased  knowledge of use of DME, Impaired flexibility, Decreased balance, Decreased mobility, Obesity, Decreased range of motion, Increased edema, Pain  Visit Diagnosis: Lymphedema, not elsewhere classified    Problem List Patient Active Problem List   Diagnosis Date Noted  . Endometrial cancer ([place]) 04/10/2017  . 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 Chronic acquired lymphedema 01/08/2017  . Obesity, morbid, BMI 40.0-49.9 ([place]) 11/13/2016  . FIGO stage II endometrial cancer ([place]) 10/29/2016  . Left ventricular hypertrophy 11/27/2014  . Lymphedema of lower extremity 08/23/2014  . Severe aortic stenosis 08/21/2014  . Benign essential HTN 08/21/2014  . Depression, major, recurrent, mild ([place]) 08/21/2014  . Dyslipidemia 08/21/2014  . Cardiac dysrhythmia 08/21/2014  . Dysmetabolic syndrome 42/70/6237  . Symptomatic menopausal or female climacteric states 08/21/2014  . 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 History of shingles 08/21/2014  . Personal history of skin cancer 11/24/2009    Andrey Spearman, MS, OTR/L, [facility] 05/21/17 1:11 PM\  [place] MAIN [facility] SERVICES [address] [place], [place], [address] Phone: [phone]   Fax:  [phone]  Name: Melody Soto MRN: [id number] Date of Birth: [birth year]

## 2017-05-23 ENCOUNTER — Ambulatory Visit: Payer: Medicare HMO | Admitting: Occupational Therapy

## 2017-05-23 DIAGNOSIS — I890 Lymphedema, not elsewhere classified: Secondary | ICD-10-CM | POA: Diagnosis not present

## 2017-05-23 NOTE — Therapy (Signed)
[place] MAIN [facility] SERVICES [address] [place], [place], [address] Phone: [phone]   Fax:  [phone]  Occupational Therapy Treatment  Patient Details  Name: Melody Soto MRN: [id number] Date of Birth: 1950/10/09 Referring Provider: Steele Sizer, MD   Encounter Date: 05/23/2017  OT End of Session - 05/23/17 1223    Visit Number  6    Number of Visits  36    Date for OT Re-Evaluation  07/30/17    OT Start Time  1110    OT Stop Time  1216    OT Time Calculation (min)  66 min    Activity Tolerance  Patient tolerated treatment well;No increased pain    Behavior During Therapy  WFL for tasks assessed/performed       Past Medical History:  Diagnosis Date  . Aortic stenosis   . Cataract   . Depression   . Endometrial cancer determined by uterine biopsy ([place]) 10/29/2016  . Heart murmur    HX OF  . Hyperlipidemia   . Hypertension    CONTROLLED ON MEDS  . Lymphedema   . Shortness of breath dyspnea     Past Surgical History:  Procedure Laterality Date  . 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 cataract surgery Bilateral 05/24/2014   second eye 06/07/2014  . MELANOMA EXCISION    . OVARIAN CYST REMOVAL     EXPLORATORY LAPAROTOMY  . TONSILLECTOMY      There were no vitals filed for this visit.  Subjective Assessment - 05/23/17 1113    Subjective   Melody Soto presents for Occupational Therapy visit 6/36 to address BLE with CDT. Pt presents without gradient compression wraps in place. Pt reports that compression wrapping is still awkward for her. "Once I get to my ankle it's ok. It's the foot that's really hard."    Pertinent History  BLE swelling w/ insideous onset in early 40's- no known precipitating event; no known family hx of limb swelling; Transcatheter aortic valve replacement 02/2017;  Hx depression; orbid obesity; HTN; FIGO stage II Endometrial cancer (HCC); external XRT and brachytherapy complete; hx  melanoma excision; urinary incontinence,     Limitations  chronic leg pain and swelling, R>L; limits ability to participate in activities requiring standing and walking > 30 minutes, ; contributes to difficulty reaching feet to inspect skin, perform nail and sking care, and bathe feet and distal legs, LB bathing;,contributes. L Chronic leg swelling limits ability to dress and fit LB clothing and street shoes,. Chronic, progressive BLE lymphedema  contributes to impaired body image and depression.    Special Tests  Lymphedema Life Impact Scale (LLIS) score- baseline = 32, which translates to 47% iperceived impairement due to lymphedema over the past week    Patient Stated Goals  get the swelling and pain in my legs down so I can walk more and wear normal shoes    Currently in Pain?  No/denies    Pain Onset  More than a month ago > 20 years                           OT Education - 05/23/17 1222    Education provided  Yes    Education Details  emphasis of LE self care training on application of compression wraps using gradient techniques. Pt required max A to apply wraps today.    Person(s) Educated  Patient    Methods  Explanation;Demonstration;Tactile cues;Verbal  cues    Comprehension  Verbalized understanding;Returned demonstration;Need further instruction;Verbal cues required;Tactile cues required          OT Long Term Goals - 05/01/17 1312      OT LONG TERM GOAL #1   Title  Pt modified independent w/ lymphedema precautions/prevention principals and using printed reference to limit LE progression and infection risk.    Baseline  Max A    Time  10    Period  Days    Status  New    Target Date  -- 10th OT visit      OT LONG TERM GOAL #2   Title  Lymphedema (LE) management/ self-care: Pt able to apply knee -length gradient compression wraps with extra time and printed reference PRN (modified independence) within 10 visits to achieve optimal limb volume reduction  during Intensive Phase CDT.    Baseline  dependent    Time  10    Period  Days    Status  New    Target Date  -- 10th OT visit      OT LONG TERM GOAL #3   Title  Lymphedema (LE) management/ self-care:  Pt to achieve at least 10% limb volume reductions below knees bilaterally during Intensive Phase CDT to limit LE progression, to improve tissue integrity, to decrease infection risk and to improve functional arm and hand use essential for basic and instrumental ADLs performance.    Baseline  dependent    Time  12    Period  Weeks    Status  New    Target Date  07/30/17      OT LONG TERM GOAL #4   Title  Lymphedema (LE) management/ self-care:  Pt to tolerate daily compression wraps, compression garments and/ or HOS devices in keeping w/ prescribed wear regime within 1 week of issue date of each to progress and retain clinical and functional gains and to limit LE progression.    Baseline  Max A    Time  12    Period  Weeks    Status  New    Target Date  07/30/17      OT LONG TERM GOAL #5   Title  Lymphedema (LE) management/ self-care:  During Management Phase CDT Pt to sustain current limb volumes within 5%, and all other clinical gains achieved during OT treatment independently (to control limb swelling and associated pain, to  limit LE progression, to decrease infection risk and to limit further functional decline.    Baseline  Max A    Time  6    Period  Months    Status  New    Target Date  10/29/17            Plan - 05/23/17 1223    Clinical Impression Statement  Pt completed week 1 of Intensive Phase CDT without difficulty. She has not mastered self-wrapping yet, but she has made steady progress towards this goal.  RLE has decreased in limb volume and tissue density by visual and palpable assessment. Pt reports her lweg feels better and is less heavy. Pt  tolerate MLD and skin in clinic well today. Cont as per POC.    Occupational performance deficits (Please refer to  evaluation for details):  ADL's;IADL's;Work;Leisure;Social Participation;Other body image    Rehab Potential  Good    OT Frequency  3x / week    OT Duration  12 weeks    OT Treatment/Interventions  Self-care/ADL training;Therapeutic exercise;Manual Therapy;Manual lymph drainage;Therapeutic  activities;DME and/or AE instruction;Compression bandaging;Other (comment);Patient/family education skin care with low ph lotion and / or castor oil to improve skin hydration and flexibility    Clinical Decision Making  Several treatment options, min-mod task modification necessary    Recommended Other Services  fit with knee length, custom, ccl 2-3, flat knit compression stockings, and knee length HOS devices to facilitate improved lymphatic function and decrease fibrosis formation during HOS    Consulted and Agree with Plan of Care  Patient       Patient will benefit from skilled therapeutic intervention in order to improve the following deficits and impairments:  Decreased skin integrity, Decreased knowledge of precautions, Decreased knowledge of use of DME, Impaired flexibility, Decreased balance, Decreased mobility, Obesity, Decreased range of motion, Increased edema, Pain  Visit Diagnosis: Lymphedema, not elsewhere classified    Problem List Patient Active Problem List   Diagnosis Date Noted  . Endometrial cancer ([place]) 04/10/2017  . 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 Chronic acquired lymphedema 01/08/2017  . Obesity, morbid, BMI 40.0-49.9 ([place]) 11/13/2016  . FIGO stage II endometrial cancer ([place]) 10/29/2016  . Left ventricular hypertrophy 11/27/2014  . Lymphedema of lower extremity 08/23/2014  . Severe aortic stenosis 08/21/2014  . Benign essential HTN 08/21/2014  . Depression, major, recurrent, mild ([place]) 08/21/2014  . Dyslipidemia 08/21/2014  . Cardiac dysrhythmia 08/21/2014  . Dysmetabolic syndrome 00/34/9179  . Symptomatic menopausal or female climacteric  states 08/21/2014  . 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 History of shingles 08/21/2014  . Personal history of skin cancer 11/24/2009    Andrey Spearman, MS, OTR/L, [facility] 05/23/17 12:28 PM   [place] MAIN [facility] SERVICES [address] [place], [place], [address] Phone: [phone]   Fax:  [phone]  Name: Melody Soto MRN: [id number] Date of Birth: 1950/04/28

## 2017-05-28 ENCOUNTER — Ambulatory Visit: Payer: Medicare HMO | Admitting: Occupational Therapy

## 2017-05-28 DIAGNOSIS — I890 Lymphedema, not elsewhere classified: Secondary | ICD-10-CM

## 2017-05-28 NOTE — Therapy (Signed)
[place] MAIN [facility] SERVICES [address] [place], [place], [address] Phone: [phone]   Fax:  [phone]  Occupational Therapy Treatment  Patient Details  Name: Melody Soto MRN: [id number] Date of Birth: August 10, 1950 Referring Provider: Steele Sizer, MD   Encounter Date: 05/28/2017  OT End of Session - 05/28/17 1728    Visit Number  7    Number of Visits  36    Date for OT Re-Evaluation  07/30/17    OT Start Time  1115    OT Stop Time  1230    OT Time Calculation (min)  75 min    Activity Tolerance  Patient tolerated treatment well;No increased pain    Behavior During Therapy  WFL for tasks assessed/performed       Past Medical History:  Diagnosis Date  . Aortic stenosis   . Cataract   . Depression   . Endometrial cancer determined by uterine biopsy ([place]) 10/29/2016  . Heart murmur    HX OF  . Hyperlipidemia   . Hypertension    CONTROLLED ON MEDS  . Lymphedema   . Shortness of breath dyspnea     Past Surgical History:  Procedure Laterality Date  . 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 cataract surgery Bilateral 05/24/2014   second eye 06/07/2014  . MELANOMA EXCISION    . OVARIAN CYST REMOVAL     EXPLORATORY LAPAROTOMY  . TONSILLECTOMY      There were no vitals filed for this visit.  Subjective Assessment - 05/28/17 1725    Subjective   Ms. Vonstein presents for Occupational Therapy visit 7/36 to address BLE with CDT. Pt is 15 minutes late for a 60 minute appointment, Pt presents with gradient compression wraps in place.Melody Soto repports  she keeps walking out of cast shioe, so she took it off. "It flops off at the heel."    Pertinent History  BLE swelling w/ insideous onset in early 40's- no known precipitating event; no known family hx of limb swelling; Transcatheter aortic valve replacement 02/2017;  Hx depression; orbid obesity; HTN; FIGO stage II Endometrial cancer (HCC); external XRT and brachytherapy  complete; hx melanoma excision; urinary incontinence,     Limitations  chronic leg pain and swelling, R>L; limits ability to participate in activities requiring standing and walking > 30 minutes, ; contributes to difficulty reaching feet to inspect skin, perform nail and sking care, and bathe feet and distal legs, LB bathing;,contributes. L Chronic leg swelling limits ability to dress and fit LB clothing and street shoes,. Chronic, progressive BLE lymphedema  contributes to impaired body image and depression.    Special Tests  Lymphedema Life Impact Scale (LLIS) score- baseline = 32, which translates to 47% iperceived impairement due to lymphedema over the past week    Patient Stated Goals  get the swelling and pain in my legs down so I can walk more and wear normal shoes    Currently in Pain?  No/denies    Pain Onset  More than a month ago > 20 years                   OT Treatments/Exercises (OP) - 05/28/17 0001      ADLs   ADL Education Given  Yes      Manual Therapy   Manual Therapy  Compression Bandaging;Manual Lymphatic Drainage (MLD)    Manual therapy comments  skin care during MLD w/ low ph castor oil to improve hyration  Edema Management  modified post op shoe to improve function. Added Dycem insole        and tightened strap at ankle as tightly as possible.    Manual Lymphatic Drainage (MLD)  MLD to LLE utilizing short neck sequence, deep abdominal breathing, functional healthy inguinal LN, and J strokes to leg segments and foot.     Compression Bandaging  LLE compression wraps applied from toes to below knee: Toe wrap to foot and digits 1-3 using Transelast Classic. Cotton stockinett under  8 cm x 5 m x 1 to foot and ankle, then 10 cm  x 5 m x 2 applied circumferentially in custommary layered gradient configuration. All wraps applied over  over  0.4 cm Rosidal foam to below knee.             OT Education - 05/28/17 1728    Education provided  Yes    Education  Details  Continued skilled Pt/caregiver education  And LE ADL training throughout visit for lymphedema self care/ home program, including compression wrapping, compression garment and device wear/care, lymphatic pumping ther ex, simple self-MLD, and skin care. Discussed progress towards goals.     Person(s) Educated  Patient    Methods  Explanation;Demonstration    Comprehension  Returned demonstration;Verbalized understanding          OT Long Term Goals - 05/01/17 1312      OT LONG TERM GOAL #1   Title  Pt modified independent w/ lymphedema precautions/prevention principals and using printed reference to limit LE progression and infection risk.    Baseline  Max A    Time  10    Period  Days    Status  New    Target Date  -- 10th OT visit      OT LONG TERM GOAL #2   Title  Lymphedema (LE) management/ self-care: Pt able to apply knee -length gradient compression wraps with extra time and printed reference PRN (modified independence) within 10 visits to achieve optimal limb volume reduction during Intensive Phase CDT.    Baseline  dependent    Time  10    Period  Days    Status  New    Target Date  -- 10th OT visit      OT LONG TERM GOAL #3   Title  Lymphedema (LE) management/ self-care:  Pt to achieve at least 10% limb volume reductions below knees bilaterally during Intensive Phase CDT to limit LE progression, to improve tissue integrity, to decrease infection risk and to improve functional arm and hand use essential for basic and instrumental ADLs performance.    Baseline  dependent    Time  12    Period  Weeks    Status  New    Target Date  07/30/17      OT LONG TERM GOAL #4   Title  Lymphedema (LE) management/ self-care:  Pt to tolerate daily compression wraps, compression garments and/ or HOS devices in keeping w/ prescribed wear regime within 1 week of issue date of each to progress and retain clinical and functional gains and to limit LE progression.    Baseline  Max A     Time  12    Period  Weeks    Status  New    Target Date  07/30/17      OT LONG TERM GOAL #5   Title  Lymphedema (LE) management/ self-care:  During Management Phase CDT Pt to sustain current limb volumes  within 5%, and all other clinical gains achieved during OT treatment independently (to control limb swelling and associated pain, to  limit LE progression, to decrease infection risk and to limit further functional decline.    Baseline  Max A    Time  6    Period  Months    Status  New    Target Date  10/29/17            Plan - 05/28/17 1729    Clinical Impression Statement  Pt repots she did not utilize compression wraps for 2 of 3 day visit interval because it was too hard to manage while house/ pet sitting away from home. Pt reports foam chip bag used on top of foot beneath compression wraps  reducesd swelling on tope of foot dramatically. Pt tolerated MLD and compression wrapping today. Spent 30 minutes reviewing wrap techniques and sequence f to achieve proper gradient.  Modified cast shoe with custom fabbricated Dycem ni9sole to limit slipping.Cont as per POC.    Occupational performance deficits (Please refer to evaluation for details):  ADL's;IADL's;Work;Leisure;Social Participation;Other body image    Rehab Potential  Good    OT Frequency  3x / week    OT Duration  12 weeks    OT Treatment/Interventions  Self-care/ADL training;Therapeutic exercise;Manual Therapy;Manual lymph drainage;Therapeutic activities;DME and/or AE instruction;Compression bandaging;Other (comment);Patient/family education skin care with low ph lotion and / or castor oil to improve skin hydration and flexibility    Clinical Decision Making  Several treatment options, min-mod task modification necessary    Recommended Other Services  fit with knee length, custom, ccl 2-3, flat knit compression stockings, and knee length HOS devices to facilitate improved lymphatic function and decrease fibrosis formation  during HOS    Consulted and Agree with Plan of Care  Patient       Patient will benefit from skilled therapeutic intervention in order to improve the following deficits and impairments:  Decreased skin integrity, Decreased knowledge of precautions, Decreased knowledge of use of DME, Impaired flexibility, Decreased balance, Decreased mobility, Obesity, Decreased range of motion, Increased edema, Pain  Visit Diagnosis: Lymphedema, not elsewhere classified    Problem List Patient Active Problem List   Diagnosis Date Noted  . Endometrial cancer ([place]) 04/10/2017  . 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 Chronic acquired lymphedema 01/08/2017  . Obesity, morbid, BMI 40.0-49.9 ([place]) 11/13/2016  . FIGO stage II endometrial cancer ([place]) 10/29/2016  . Left ventricular hypertrophy 11/27/2014  . Lymphedema of lower extremity 08/23/2014  . Severe aortic stenosis 08/21/2014  . Benign essential HTN 08/21/2014  . Depression, major, recurrent, mild ([place]) 08/21/2014  . Dyslipidemia 08/21/2014  . Cardiac dysrhythmia 08/21/2014  . Dysmetabolic syndrome 60/63/0160  . Symptomatic menopausal or female climacteric states 08/21/2014  . 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 History of shingles 08/21/2014  . Personal history of skin cancer 11/24/2009    Andrey Spearman, MS, OTR/L, [facility] 05/28/17 5:33 PM  [place] MAIN [facility] SERVICES [address] [place], [place], [address] Phone: [phone]   Fax:  [phone]  Name: JAMIN HUMPHRIES MRN: [id number] Date of Birth: 1950/07/04

## 2017-05-29 ENCOUNTER — Ambulatory Visit: Payer: Medicare HMO | Admitting: Occupational Therapy

## 2017-05-29 DIAGNOSIS — I890 Lymphedema, not elsewhere classified: Secondary | ICD-10-CM

## 2017-05-29 NOTE — Therapy (Signed)
[place] MAIN [facility] SERVICES [address] [place], [place], [address] Phone: [phone]   Fax:  [phone]  Occupational Therapy Treatment  Patient Details  Name: Melody Soto MRN: [id number] Date of Birth: 06/08/1950 Referring Provider: Steele Sizer, MD   Encounter Date: 05/29/2017  OT End of Session - 05/28/17 1728    Visit Number  7    Number of Visits  36    Date for OT Re-Evaluation  07/30/17    OT Start Time  1115    OT Stop Time  1230    OT Time Calculation (min)  75 min    Activity Tolerance  Patient tolerated treatment well;No increased pain    Behavior During Therapy  WFL for tasks assessed/performed       Past Medical History:  Diagnosis Date  . Aortic stenosis   . Cataract   . Depression   . Endometrial cancer determined by uterine biopsy ([place]) 10/29/2016  . Heart murmur    HX OF  . Hyperlipidemia   . Hypertension    CONTROLLED ON MEDS  . Lymphedema   . Shortness of breath dyspnea     Past Surgical History:  Procedure Laterality Date  . 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 cataract surgery Bilateral 05/24/2014   second eye 06/07/2014  . MELANOMA EXCISION    . OVARIAN CYST REMOVAL     EXPLORATORY LAPAROTOMY  . TONSILLECTOMY      There were no vitals filed for this visit.  Subjective Assessment - 05/29/17 1308    Subjective   [name] presents for Occupational Therapy visit 8/36 to address BLE with CDT. Pt is 15 minutes late for a 60 minute appointment, Pt presents with gradient compression wraps in place.. Pt reports that she bought capri length pajamas yesterday, and has never bought or worn short pants that didn't cover her legs.  "My legs feel better and I feel like I'm digging out of a hole somehow."    Pertinent History  BLE swelling w/ insideous onset in early 40's- no known precipitating event; no known family hx of limb swelling; Transcatheter aortic valve replacement 02/2017;  Hx  depression; orbid obesity; HTN; FIGO stage II Endometrial cancer (HCC); external XRT and brachytherapy complete; hx melanoma excision; urinary incontinence,     Limitations  chronic leg pain and swelling, R>L; limits ability to participate in activities requiring standing and walking > 30 minutes, ; contributes to difficulty reaching feet to inspect skin, perform nail and sking care, and bathe feet and distal legs, LB bathing;,contributes. L Chronic leg swelling limits ability to dress and fit LB clothing and street shoes,. Chronic, progressive BLE lymphedema  contributes to impaired body image and depression.    Special Tests  Lymphedema Life Impact Scale (LLIS) score- baseline = 32, which translates to 47% iperceived impairement due to lymphedema over the past week    Patient Stated Goals  get the swelling and pain in my legs down so I can walk more and wear normal shoes    Currently in Pain?  No/denies    Pain Onset  More than a month ago > 20 years                   OT Treatments/Exercises (OP) - 05/29/17 0001      ADLs   ADL Education Given  Yes      Manual Therapy   Manual Therapy  Compression Bandaging;Manual Lymphatic Drainage (MLD)  Manual therapy comments  skin care during MLD w/ low ph castor oil to improve hyration     Edema Management  modified post op shoe staying in place well    Manual Lymphatic Drainage (MLD)  MLD to LLE utilizing short neck sequence, deep abdominal breathing, functional healthy inguinal LN, and J strokes to leg segments and foot.     Compression Bandaging  LLE compression wraps applied from toes to below knee: Toe wrap to foot and digits 1-3 using Transelast Classic. Cotton stockinett under  8 cm x 5 m x 1 to foot and ankle, then 10 cm  x 5 m x 2 applied circumferentially in custommary layered gradient configuration. All wraps applied over  over  0.4 cm Rosidal foam to below knee.             OT Education - 05/29/17 1310    Education  provided  Yes    Education Details  Continued skilled Pt/caregiver education  And LE ADL training throughout visit for lymphedema self care/ home program, including compression wrapping, compression garment and device wear/care, lymphatic pumping ther ex, simple self-MLD, and skin care. Discussed progress towards goals.     Person(s) Educated  Patient    Methods  Explanation;Demonstration    Comprehension  Verbalized understanding;Returned demonstration          OT Long Term Goals - 05/01/17 1312      OT LONG TERM GOAL #1   Title  Pt modified independent w/ lymphedema precautions/prevention principals and using printed reference to limit LE progression and infection risk.    Baseline  Max A    Time  10    Period  Days    Status  New    Target Date  -- 10th OT visit      OT LONG TERM GOAL #2   Title  Lymphedema (LE) management/ self-care: Pt able to apply knee -length gradient compression wraps with extra time and printed reference PRN (modified independence) within 10 visits to achieve optimal limb volume reduction during Intensive Phase CDT.    Baseline  dependent    Time  10    Period  Days    Status  New    Target Date  -- 10th OT visit      OT LONG TERM GOAL #3   Title  Lymphedema (LE) management/ self-care:  Pt to achieve at least 10% limb volume reductions below knees bilaterally during Intensive Phase CDT to limit LE progression, to improve tissue integrity, to decrease infection risk and to improve functional arm and hand use essential for basic and instrumental ADLs performance.    Baseline  dependent    Time  12    Period  Weeks    Status  New    Target Date  07/30/17      OT LONG TERM GOAL #4   Title  Lymphedema (LE) management/ self-care:  Pt to tolerate daily compression wraps, compression garments and/ or HOS devices in keeping w/ prescribed wear regime within 1 week of issue date of each to progress and retain clinical and functional gains and to limit LE  progression.    Baseline  Max A    Time  12    Period  Weeks    Status  New    Target Date  07/30/17      OT LONG TERM GOAL #5   Title  Lymphedema (LE) management/ self-care:  During Management Phase CDT Pt to sustain current  limb volumes within 5%, and all other clinical gains achieved during OT treatment independently (to control limb swelling and associated pain, to  limit LE progression, to decrease infection risk and to limit further functional decline.    Baseline  Max A    Time  6    Period  Months    Status  New    Target Date  10/29/17            Plan - 05/29/17 1311    Clinical Impression Statement  Pt reporting decreased leg discomfort, improved     walking and balance, as well as improved body image with progress with limb volume reduction thus far. Pt continues to have difficulty with self -wrapping between sessions. Pt tolerated MLD today without difficulty. Wraps applied as established. Cont as per POC. Consider adding custom fabricated ship bag to ankle   next visit.    Occupational performance deficits (Please refer to evaluation for details):  ADL's;IADL's;Work;Leisure;Social Participation;Other body image    Rehab Potential  Good    OT Frequency  3x / week    OT Duration  12 weeks    OT Treatment/Interventions  Self-care/ADL training;Therapeutic exercise;Manual Therapy;Manual lymph drainage;Therapeutic activities;DME and/or AE instruction;Compression bandaging;Other (comment);Patient/family education skin care with low ph lotion and / or castor oil to improve skin hydration and flexibility    Clinical Decision Making  Several treatment options, min-mod task modification necessary    Recommended Other Services  fit with knee length, custom, ccl 2-3, flat knit compression stockings, and knee length HOS devices to facilitate improved lymphatic function and decrease fibrosis formation during HOS    Consulted and Agree with Plan of Care  Patient       Patient will  benefit from skilled therapeutic intervention in order to improve the following deficits and impairments:  Decreased skin integrity, Decreased knowledge of precautions, Decreased knowledge of use of DME, Impaired flexibility, Decreased balance, Decreased mobility, Obesity, Decreased range of motion, Increased edema, Pain  Visit Diagnosis: Lymphedema, not elsewhere classified    Problem List Patient Active Problem List   Diagnosis Date Noted  . Endometrial cancer ([place]) 04/10/2017  . 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 Chronic acquired lymphedema 01/08/2017  . Obesity, morbid, BMI 40.0-49.9 ([place]) 11/13/2016  . FIGO stage II endometrial cancer ([place]) 10/29/2016  . Left ventricular hypertrophy 11/27/2014  . Lymphedema of lower extremity 08/23/2014  . Severe aortic stenosis 08/21/2014  . Benign essential HTN 08/21/2014  . Depression, major, recurrent, mild ([place]) 08/21/2014  . Dyslipidemia 08/21/2014  . Cardiac dysrhythmia 08/21/2014  . Dysmetabolic syndrome 55/97/4163  . Symptomatic menopausal or female climacteric states 08/21/2014  . 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 History of shingles 08/21/2014  . Personal history of skin cancer 11/24/2009    Andrey Spearman, MS, OTR/L, [facility] 05/29/17 1:14 PM  [place] MAIN [facility] SERVICES [address] [place], [place], [address] Phone: [phone]   Fax:  [phone]  Name: Melody Soto MRN: [id number] Date of Birth: [birth year]

## 2017-05-30 ENCOUNTER — Ambulatory Visit: Payer: Medicare HMO | Admitting: Occupational Therapy

## 2017-05-30 DIAGNOSIS — I890 Lymphedema, not elsewhere classified: Secondary | ICD-10-CM

## 2017-05-30 NOTE — Therapy (Signed)
[place] MAIN [facility] SERVICES [address] [place], [place], [address] Phone: [phone]   Fax:  [phone]  Occupational Therapy Treatment  Patient Details  Name: Melody Soto MRN: [id number] Date of Birth: [birth year] Referring Provider: Steele Sizer, MD   Encounter Date: 05/30/2017  OT End of Session - 05/30/17 1654    Visit Number  8    Number of Visits  36    Date for OT Re-Evaluation  07/30/17    OT Start Time  1110    OT Stop Time  1225    OT Time Calculation (min)  75 min    Activity Tolerance  Patient tolerated treatment well;No increased pain    Behavior During Therapy  WFL for tasks assessed/performed       Past Medical History:  Diagnosis Date  . Aortic stenosis   . Cataract   . Depression   . Endometrial cancer determined by uterine biopsy ([place]) 10/29/2016  . Heart murmur    HX OF  . Hyperlipidemia   . Hypertension    CONTROLLED ON MEDS  . Lymphedema   . Shortness of breath dyspnea     Past Surgical History:  Procedure Laterality Date  . 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 cataract surgery Bilateral 05/24/2014   second eye 06/07/2014  . MELANOMA EXCISION    . OVARIAN CYST REMOVAL     EXPLORATORY LAPAROTOMY  . TONSILLECTOMY      There were no vitals filed for this visit.  Subjective Assessment - 05/30/17 1651    Subjective   Ms. Swartout presents for Occupational Therapy visit 9/36 to address BLE with CDT. Pt is on time today.  Pt presents with gradient compression wraps in place stating, "I hope I did the wraps right."    Pertinent History  BLE swelling w/ insideous onset in early 40's- no known precipitating event; no known family hx of limb swelling; Transcatheter aortic valve replacement 02/2017;  Hx depression; orbid obesity; HTN; FIGO stage II Endometrial cancer (HCC); external XRT and brachytherapy complete; hx melanoma excision; urinary incontinence,     Limitations  chronic leg pain  and swelling, R>L; limits ability to participate in activities requiring standing and walking > 30 minutes, ; contributes to difficulty reaching feet to inspect skin, perform nail and sking care, and bathe feet and distal legs, LB bathing;,contributes. L Chronic leg swelling limits ability to dress and fit LB clothing and street shoes,. Chronic, progressive BLE lymphedema  contributes to impaired body image and depression.    Special Tests  Lymphedema Life Impact Scale (LLIS) score- baseline = 32, which translates to 47% iperceived impairement due to lymphedema over the past week    Patient Stated Goals  get the swelling and pain in my legs down so I can walk more and wear normal shoes    Currently in Pain?  No/denies    Pain Onset  More than a month ago > 20 years                   OT Treatments/Exercises (OP) - 05/30/17 0001      ADLs   ADL Education Given  Yes      Manual Therapy   Manual Therapy  Compression Bandaging;Manual Lymphatic Drainage (MLD)    Manual therapy comments  skin care during MLD w/ low ph castor oil to improve hyration     Edema Management  modified post op shoe staying in place well  Manual Lymphatic Drainage (MLD)  MLD to LLE utilizing short neck sequence, deep abdominal breathing, functional healthy inguinal LN, and J strokes to leg segments and foot.     Compression Bandaging  LLE compression wraps applied from toes to below knee: Toe wrap to foot and digits 1-3 using Transelast Classic. Cotton stockinett under  8 cm x 5 m x 1 to foot and ankle, then 10 cm  x 5 m x 2 applied circumferentially in custommary layered gradient configuration. All wraps applied over  over  0.4 cm Rosidal foam to below knee.             OT Education - 05/30/17 1653    Education provided  Yes    Education Details  Demonstrated knee length, multi-layered, gradient compression wraps to LLE again today as Pt is having a difficult time learning these techniques.     Person(s) Educated  Patient    Methods  Explanation;Demonstration    Comprehension  Verbalized understanding;Need further instruction          OT Long Term Goals - 05/01/17 1312      OT LONG TERM GOAL #1   Title  Pt modified independent w/ lymphedema precautions/prevention principals and using printed reference to limit LE progression and infection risk.    Baseline  Max A    Time  10    Period  Days    Status  New    Target Date  -- 10th OT visit      OT LONG TERM GOAL #2   Title  Lymphedema (LE) management/ self-care: Pt able to apply knee -length gradient compression wraps with extra time and printed reference PRN (modified independence) within 10 visits to achieve optimal limb volume reduction during Intensive Phase CDT.    Baseline  dependent    Time  10    Period  Days    Status  New    Target Date  -- 10th OT visit      OT LONG TERM GOAL #3   Title  Lymphedema (LE) management/ self-care:  Pt to achieve at least 10% limb volume reductions below knees bilaterally during Intensive Phase CDT to limit LE progression, to improve tissue integrity, to decrease infection risk and to improve functional arm and hand use essential for basic and instrumental ADLs performance.    Baseline  dependent    Time  12    Period  Weeks    Status  New    Target Date  07/30/17      OT LONG TERM GOAL #4   Title  Lymphedema (LE) management/ self-care:  Pt to tolerate daily compression wraps, compression garments and/ or HOS devices in keeping w/ prescribed wear regime within 1 week of issue date of each to progress and retain clinical and functional gains and to limit LE progression.    Baseline  Max A    Time  12    Period  Weeks    Status  New    Target Date  07/30/17      OT LONG TERM GOAL #5   Title  Lymphedema (LE) management/ self-care:  During Management Phase CDT Pt to sustain current limb volumes within 5%, and all other clinical gains achieved during OT treatment independently (to  control limb swelling and associated pain, to  limit LE progression, to decrease infection risk and to limit further functional decline.    Baseline  Max A    Time  6  Period  Months    Status  New    Target Date  10/29/17            Plan - 05/30/17 1655    Clinical Impression Statement  Pthaving difficulty learning compression wrapping techniques. Reviewed again today. Limb volume is mildly decreased since commencing OT for CDT by visual assessment. We'll repeat limb volumetrics again next week.eports her legs "feel better and lighter." Pt tolerated MLD and skin care without difficulty today. Modified compression wrap to exclude ship bag to dorsal foot lo limit possible skin irritation. Pt will resume chip bag tomorrow and we'll  use it on alternate days.    Occupational performance deficits (Please refer to evaluation for details):  ADL's;IADL's;Work;Leisure;Social Participation;Other body image    Rehab Potential  Good    OT Frequency  3x / week    OT Duration  12 weeks    OT Treatment/Interventions  Self-care/ADL training;Therapeutic exercise;Manual Therapy;Manual lymph drainage;Therapeutic activities;DME and/or AE instruction;Compression bandaging;Other (comment);Patient/family education skin care with low ph lotion and / or castor oil to improve skin hydration and flexibility    Clinical Decision Making  Several treatment options, min-mod task modification necessary    Recommended Other Services  fit with knee length, custom, ccl 2-3, flat knit compression stockings, and knee length HOS devices to facilitate improved lymphatic function and decrease fibrosis formation during HOS    Consulted and Agree with Plan of Care  Patient       Patient will benefit from skilled therapeutic intervention in order to improve the following deficits and impairments:  Decreased skin integrity, Decreased knowledge of precautions, Decreased knowledge of use of DME, Impaired flexibility, Decreased  balance, Decreased mobility, Obesity, Decreased range of motion, Increased edema, Pain  Visit Diagnosis: Lymphedema, not elsewhere classified    Problem List Patient Active Problem List   Diagnosis Date Noted  . Endometrial cancer ([place]) 04/10/2017  . 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 Chronic acquired lymphedema 01/08/2017  . Obesity, morbid, BMI 40.0-49.9 ([place]) 11/13/2016  . FIGO stage II endometrial cancer ([place]) 10/29/2016  . Left ventricular hypertrophy 11/27/2014  . Lymphedema of lower extremity 08/23/2014  . Severe aortic stenosis 08/21/2014  . Benign essential HTN 08/21/2014  . Depression, major, recurrent, mild ([place]) 08/21/2014  . Dyslipidemia 08/21/2014  . Cardiac dysrhythmia 08/21/2014  . Dysmetabolic syndrome 96/75/9163  . Symptomatic menopausal or female climacteric states 08/21/2014  . 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 History of shingles 08/21/2014  . Personal history of skin cancer 11/24/2009    Andrey Spearman, MS, OTR/L, [facility] 05/30/17 4:59 PM  [place] MAIN [facility] SERVICES [address] [place], [place], [address] Phone: [phone]   Fax:  [phone]  Name: Melody Soto MRN: [id number] Date of Birth: 04-16-1950

## 2017-06-04 ENCOUNTER — Ambulatory Visit: Payer: Medicare HMO | Admitting: Occupational Therapy

## 2017-06-04 DIAGNOSIS — I890 Lymphedema, not elsewhere classified: Secondary | ICD-10-CM | POA: Diagnosis not present

## 2017-06-04 NOTE — Therapy (Signed)
[place] MAIN [facility] SERVICES [address] [place], [place], [address] Phone: [phone]   Fax:  [phone]  Occupational Therapy Treatment  Patient Details  Name: Melody Soto MRN: [id number] Date of Birth: 01/02/1951 Referring Provider: Steele Sizer, MD   Encounter Date: 06/04/2017  OT End of Session - 06/04/17 1424    Visit Number  9    Number of Visits  36    Date for OT Re-Evaluation  07/30/17    OT Start Time  1118    OT Stop Time  1216    OT Time Calculation (min)  58 min    Activity Tolerance  Patient tolerated treatment well;No increased pain    Behavior During Therapy  WFL for tasks assessed/performed       Past Medical History:  Diagnosis Date  . Aortic stenosis   . Cataract   . Depression   . Endometrial cancer determined by uterine biopsy ([place]) 10/29/2016  . Heart murmur    HX OF  . Hyperlipidemia   . Hypertension    CONTROLLED ON MEDS  . Lymphedema   . Shortness of breath dyspnea     Past Surgical History:  Procedure Laterality Date  . 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 cataract surgery Bilateral 05/24/2014   second eye 06/07/2014  . MELANOMA EXCISION    . OVARIAN CYST REMOVAL     EXPLORATORY LAPAROTOMY  . TONSILLECTOMY      There were no vitals filed for this visit.  Subjective Assessment - 06/04/17 1422    Subjective   Ms. Hyneman presents for Occupational Therapy visit 9/36 to address BLE with CDT. Pt is on time today.  Pt presents 15 minutes late for a 60 minute session with gradient compression wraps in place stating, "I wrapped right before I got here."    Pertinent History  BLE swelling w/ insideous onset in early 40's- no known precipitating event; no known family hx of limb swelling; Transcatheter aortic valve replacement 02/2017;  Hx depression; orbid obesity; HTN; FIGO stage II Endometrial cancer (HCC); external XRT and brachytherapy complete; hx melanoma excision; urinary  incontinence,     Limitations  chronic leg pain and swelling, R>L; limits ability to participate in activities requiring standing and walking > 30 minutes, ; contributes to difficulty reaching feet to inspect skin, perform nail and sking care, and bathe feet and distal legs, LB bathing;,contributes. L Chronic leg swelling limits ability to dress and fit LB clothing and street shoes,. Chronic, progressive BLE lymphedema  contributes to impaired body image and depression.    Special Tests  Lymphedema Life Impact Scale (LLIS) score- baseline = 32, which translates to 47% iperceived impairement due to lymphedema over the past week    Patient Stated Goals  get the swelling and pain in my legs down so I can walk more and wear normal shoes    Currently in Pain?  No/denies    Pain Onset  More than a month ago > 20 years                   OT Treatments/Exercises (OP) - 06/04/17 0001      ADLs   ADL Education Given  Yes             OT Education - 06/04/17 1423    Education provided  Yes    Education Details  Session devoted to Pt edu fopr application of knee length gradient compression wrap to  LLE to improve techniques and provide practice with clinical supervision.    Person(s) Educated  Patient    Methods  Explanation;Demonstration;Tactile cues;Verbal cues;Handout    Comprehension  Verbalized understanding;Returned demonstration;Verbal cues required;Tactile cues required;Need further instruction          OT Long Term Goals - 05/01/17 1312      OT LONG TERM GOAL #1   Title  Pt modified independent w/ lymphedema precautions/prevention principals and using printed reference to limit LE progression and infection risk.    Baseline  Max A    Time  10    Period  Days    Status  New    Target Date  -- 10th OT visit      OT LONG TERM GOAL #2   Title  Lymphedema (LE) management/ self-care: Pt able to apply knee -length gradient compression wraps with extra time and printed  reference PRN (modified independence) within 10 visits to achieve optimal limb volume reduction during Intensive Phase CDT.    Baseline  dependent    Time  10    Period  Days    Status  New    Target Date  -- 10th OT visit      OT LONG TERM GOAL #3   Title  Lymphedema (LE) management/ self-care:  Pt to achieve at least 10% limb volume reductions below knees bilaterally during Intensive Phase CDT to limit LE progression, to improve tissue integrity, to decrease infection risk and to improve functional arm and hand use essential for basic and instrumental ADLs performance.    Baseline  dependent    Time  12    Period  Weeks    Status  New    Target Date  07/30/17      OT LONG TERM GOAL #4   Title  Lymphedema (LE) management/ self-care:  Pt to tolerate daily compression wraps, compression garments and/ or HOS devices in keeping w/ prescribed wear regime within 1 week of issue date of each to progress and retain clinical and functional gains and to limit LE progression.    Baseline  Max A    Time  12    Period  Weeks    Status  New    Target Date  07/30/17      OT LONG TERM GOAL #5   Title  Lymphedema (LE) management/ self-care:  During Management Phase CDT Pt to sustain current limb volumes within 5%, and all other clinical gains achieved during OT treatment independently (to control limb swelling and associated pain, to  limit LE progression, to decrease infection risk and to limit further functional decline.    Baseline  Max A    Time  6    Period  Months    Status  New    Target Date  10/29/17            Plan - 06/04/17 1425    Clinical Impression Statement  Pt continues to have difficulty learning gradient compression wrapping tecghnique. Session devoted to Pt edu fopr application of knee length gradient compression wrap to  LLE to improve techniques and provide practice with clinical supervision.. By end of session Pt able to apply wraps using correct techniques with Max A  and VC and tactile cues, and repeated demonstrati9on.  Cont as per POC.    Occupational performance deficits (Please refer to evaluation for details):  ADL's;IADL's;Work;Leisure;Social Participation;Other body image    Rehab Potential  Good    OT Frequency  3x / week    OT Duration  12 weeks    OT Treatment/Interventions  Self-care/ADL training;Therapeutic exercise;Manual Therapy;Manual lymph drainage;Therapeutic activities;DME and/or AE instruction;Compression bandaging;Other (comment);Patient/family education skin care with low ph lotion and / or castor oil to improve skin hydration and flexibility    Clinical Decision Making  Several treatment options, min-mod task modification necessary    Recommended Other Services  fit with knee length, custom, ccl 2-3, flat knit compression stockings, and knee length HOS devices to facilitate improved lymphatic function and decrease fibrosis formation during HOS    Consulted and Agree with Plan of Care  Patient       Patient will benefit from skilled therapeutic intervention in order to improve the following deficits and impairments:  Decreased skin integrity, Decreased knowledge of precautions, Decreased knowledge of use of DME, Impaired flexibility, Decreased balance, Decreased mobility, Obesity, Decreased range of motion, Increased edema, Pain  Visit Diagnosis: Lymphedema, not elsewhere classified    Problem List Patient Active Problem List   Diagnosis Date Noted  . Endometrial cancer ([place]) 04/10/2017  . 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 Chronic acquired lymphedema 01/08/2017  . Obesity, morbid, BMI 40.0-49.9 ([place]) 11/13/2016  . FIGO stage II endometrial cancer ([place]) 10/29/2016  . Left ventricular hypertrophy 11/27/2014  . Lymphedema of lower extremity 08/23/2014  . Severe aortic stenosis 08/21/2014  . Benign essential HTN 08/21/2014  . Depression, major, recurrent, mild ([place]) 08/21/2014  . Dyslipidemia  08/21/2014  . Cardiac dysrhythmia 08/21/2014  . Dysmetabolic syndrome 00/86/7619  . Symptomatic menopausal or female climacteric states 08/21/2014  . 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 History of shingles 08/21/2014  . Personal history of skin cancer 11/24/2009    Andrey Spearman, MS, OTR/L, [facility] 06/04/17 2:27 PM  [place] MAIN [facility] SERVICES [address] [place], [place], [address] Phone: [phone]   Fax:  [phone]  Name: Melody Soto MRN: [id number] Date of Birth: Jul 04, 1950

## 2017-06-05 ENCOUNTER — Ambulatory Visit: Payer: Medicare HMO | Admitting: Occupational Therapy

## 2017-06-05 DIAGNOSIS — I890 Lymphedema, not elsewhere classified: Secondary | ICD-10-CM | POA: Diagnosis not present

## 2017-06-05 NOTE — Therapy (Signed)
[place] MAIN [facility] SERVICES [address] [place], [place], [address] Phone: [phone]   Fax:  [phone]  Occupational Therapy Treatment Note and Progress Report: Lymphedema Care  Patient Details  Name: Melody Soto MRN: [id number] Date of Birth: 01/26/1951 Referring Provider: Steele Sizer, MD   Encounter Date: 06/05/2017  OT End of Session - 06/05/17 1056    Visit Number  10    Number of Visits  36    Date for OT Re-Evaluation  07/30/17    OT Start Time  0918    OT Stop Time  1032    OT Time Calculation (min)  74 min    Activity Tolerance  Patient tolerated treatment well;No increased pain    Behavior During Therapy  WFL for tasks assessed/performed       Past Medical History:  Diagnosis Date  . Aortic stenosis   . Cataract   . Depression   . Endometrial cancer determined by uterine biopsy ([place]) 10/29/2016  . Heart murmur    HX OF  . Hyperlipidemia   . Hypertension    CONTROLLED ON MEDS  . Lymphedema   . Shortness of breath dyspnea     Past Surgical History:  Procedure Laterality Date  . 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 cataract surgery Bilateral 05/24/2014   second eye 06/07/2014  . MELANOMA EXCISION    . OVARIAN CYST REMOVAL     EXPLORATORY LAPAROTOMY  . TONSILLECTOMY      There were no vitals filed for this visit.  Subjective Assessment - 06/05/17 1053    Subjective   Ms. Ly presents for Occupational Therapy visit 10/36 to address BLE with CDT. Pt presents with wraps in place. She reports that she took them off late last night becuase they were bothering her toe. She reports she reapplied them right away, but sleep was interupted.    Pertinent History  BLE swelling w/ insideous onset in early 40's- no known precipitating event; no known family hx of limb swelling; Transcatheter aortic valve replacement 02/2017;  Hx depression; orbid obesity; HTN; FIGO stage II Endometrial cancer (HCC);  external XRT and brachytherapy complete; hx melanoma excision; urinary incontinence,     Limitations  chronic leg pain and swelling, R>L; limits ability to participate in activities requiring standing and walking > 30 minutes, ; contributes to difficulty reaching feet to inspect skin, perform nail and sking care, and bathe feet and distal legs, LB bathing;,contributes. L Chronic leg swelling limits ability to dress and fit LB clothing and street shoes,. Chronic, progressive BLE lymphedema  contributes to impaired body image and depression.    Special Tests  Lymphedema Life Impact Scale (LLIS) score- baseline = 32, which translates to 47% iperceived impairement due to lymphedema over the past week    Patient Stated Goals  get the swelling and pain in my legs down so I can walk more and wear normal shoes    Currently in Pain?  No/denies    Pain Onset  More than a month ago > 20 years                   OT Treatments/Exercises (OP) - 06/05/17 0001      ADLs   ADL Education Given  Yes      Manual Therapy   Manual Therapy  Compression Bandaging;Manual Lymphatic Drainage (MLD);Edema management    Manual therapy comments  skin care during MLD w/ low ph castor oil to improve  hyration     Edema Management  modified post op shoe staying in place well    Manual Lymphatic Drainage (MLD)  MLD to LLE utilizing short neck sequence, deep abdominal breathing, functional healthy inguinal LN, and J strokes to leg segments and foot.     Compression Bandaging  LLE compression wraps applied from toes to below knee: Toe wrap to foot and digits 1-3 using Transelast Classic. Cotton stockinett under  8 cm x 5 m x 1 to foot and ankle, then 10 cm  x 5 m x 2 applied circumferentially in custommary layered gradient configuration. All wraps applied over  over  0.4 cm Rosidal foam to below knee.             OT Education - 06/05/17 1055    Education provided  Yes    Education Details  Cont Pt edu for  compression wrap application. Pt edu re convoluted knee length boot designed to decrease fibrosis and   improve lymphatic circulation during HOS.    Person(s) Educated  Patient    Methods  Explanation;Demonstration;Verbal cues    Comprehension  Verbalized understanding;Returned demonstration;Verbal cues required;Need further instruction          OT Long Term Goals - 06/05/17 1100      OT LONG TERM GOAL #1   Title  Pt modified independent w/ lymphedema precautions/prevention principals and using printed reference to limit LE progression and infection risk.    Baseline  Max A    Time  10    Period  Days    Status  Achieved      OT LONG TERM GOAL #2   Title  Lymphedema (LE) management/ self-care: Pt able to apply knee -length gradient compression wraps with extra time and printed reference PRN (modified independence) within 10 visits to achieve optimal limb volume reduction during Intensive Phase CDT.    Baseline  dependent    Time  10    Period  Days    Status  Partially Met      OT LONG TERM GOAL #3   Title  Lymphedema (LE) management/ self-care:  Pt to achieve at least 10% limb volume reductions below knees bilaterally during Intensive Phase CDT to limit LE progression, to improve tissue integrity, to decrease infection risk and to improve functional arm and hand use essential for basic and instrumental ADLs performance.    Baseline  dependent    Time  12    Period  Weeks    Status  Partially Met      OT LONG TERM GOAL #4   Title  Lymphedema (LE) management/ self-care:  Pt to tolerate daily compression wraps, compression garments and/ or HOS devices in keeping w/ prescribed wear regime within 1 week of issue date of each to progress and retain clinical and functional gains and to limit LE progression.    Baseline  Max A    Time  12    Period  Weeks    Status  Partially Met      OT LONG TERM GOAL #5   Title  Lymphedema (LE) management/ self-care:  During Management Phase CDT  Pt to sustain current limb volumes within 5%, and all other clinical gains achieved during OT treatment independently (to control limb swelling and associated pain, to  limit LE progression, to decrease infection risk and to limit further functional decline.    Baseline  Max A    Time  6    Period  Months    Status  On-going            Plan - 06/05/17 1056    Clinical Impression Statement  Pt continues to work on application of knee length compression wraps between visits. Technique is much improved, but in need of a bit more guided practiceduring  sessions. Pt tolerating  MLD without difficulty. RLE limb volume is visibly reduced and tissue density, while still with generalized fatty, spongy edema, density is palpably decreased. convoluted knee length boot designed to decrease fibrosis and   improve lymphatic circulation during HOS. Cont as per POC.    Occupational performance deficits (Please refer to evaluation for details):  ADL's;IADL's;Work;Leisure;Social Participation;Other body image    Rehab Potential  Good    OT Frequency  3x / week    OT Duration  12 weeks    OT Treatment/Interventions  Self-care/ADL training;Therapeutic exercise;Manual Therapy;Manual lymph drainage;Therapeutic activities;DME and/or AE instruction;Compression bandaging;Other (comment);Patient/family education skin care with low ph lotion and / or castor oil to improve skin hydration and flexibility    Clinical Decision Making  Several treatment options, min-mod task modification necessary    Recommended Other Services  fit with knee length, custom, ccl 2-3, flat knit compression stockings, and knee length HOS devices to facilitate improved lymphatic function and decrease fibrosis formation during HOS    Consulted and Agree with Plan of Care  Patient       Patient will benefit from skilled therapeutic intervention in order to improve the following deficits and impairments:  Decreased skin integrity, Decreased  knowledge of precautions, Decreased knowledge of use of DME, Impaired flexibility, Decreased balance, Decreased mobility, Obesity, Decreased range of motion, Increased edema, Pain  Visit Diagnosis: Lymphedema, not elsewhere classified    Problem List Patient Active Problem List   Diagnosis Date Noted  . Endometrial cancer ([place]) 04/10/2017  . 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 Chronic acquired lymphedema 01/08/2017  . Obesity, morbid, BMI 40.0-49.9 ([place]) 11/13/2016  . FIGO stage II endometrial cancer ([place]) 10/29/2016  . Left ventricular hypertrophy 11/27/2014  . Lymphedema of lower extremity 08/23/2014  . Severe aortic stenosis 08/21/2014  . Benign essential HTN 08/21/2014  . Depression, major, recurrent, mild ([place]) 08/21/2014  . Dyslipidemia 08/21/2014  . Cardiac dysrhythmia 08/21/2014  . Dysmetabolic syndrome 00/71/2197  . Symptomatic menopausal or female climacteric states 08/21/2014  . 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 History of shingles 08/21/2014  . Personal history of skin cancer 11/24/2009    Andrey Spearman, MS, OTR/L, [facility] 06/05/17 11:02 AM  [place] MAIN [facility] SERVICES [address] [place], [place], [address] Phone: [phone]   Fax:  [phone]  Name: MEDRITH VEILLON MRN: [id number] Date of Birth: Jul 22, 1950

## 2017-06-06 ENCOUNTER — Ambulatory Visit: Payer: Medicare HMO | Admitting: Occupational Therapy

## 2017-06-06 DIAGNOSIS — I890 Lymphedema, not elsewhere classified: Secondary | ICD-10-CM

## 2017-06-06 NOTE — Therapy (Signed)
[place] MAIN [facility] SERVICES [address] [place], [place], [address] Phone: [phone]   Fax:  [phone]  Occupational Therapy Treatment  Patient Details  Name: Melody Soto MRN: [id number] Date of Birth: [birth year] Referring Provider: Steele Sizer, MD   Encounter Date: 06/06/2017  OT End of Session - 06/06/17 1657    Visit Number  11    Number of Visits  36    Date for OT Re-Evaluation  07/30/17    OT Start Time  1115    OT Stop Time  1224    OT Time Calculation (min)  69 min    Activity Tolerance  Patient tolerated treatment well;No increased pain    Behavior During Therapy  WFL for tasks assessed/performed       Past Medical History:  Diagnosis Date  . Aortic stenosis   . Cataract   . Depression   . Endometrial cancer determined by uterine biopsy ([place]) 10/29/2016  . Heart murmur    HX OF  . Hyperlipidemia   . Hypertension    CONTROLLED ON MEDS  . Lymphedema   . Shortness of breath dyspnea     Past Surgical History:  Procedure Laterality Date  . 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 cataract surgery Bilateral 05/24/2014   second eye 06/07/2014  . MELANOMA EXCISION    . OVARIAN CYST REMOVAL     EXPLORATORY LAPAROTOMY  . TONSILLECTOMY      There were no vitals filed for this visit.  Subjective Assessment - 06/06/17 1656    Subjective   [name] presents for Occupational Therapy visit 11/36 to address BLE with CDT. Pt presents with wraps in place. Pt has no new complaints.    Pertinent History  BLE swelling w/ insideous onset in early 40's- no known precipitating event; no known family hx of limb swelling; Transcatheter aortic valve replacement 02/2017;  Hx depression; orbid obesity; HTN; FIGO stage II Endometrial cancer (HCC); external XRT and brachytherapy complete; hx melanoma excision; urinary incontinence,     Limitations  chronic leg pain and swelling, R>L; limits ability to participate in  activities requiring standing and walking > 30 minutes, ; contributes to difficulty reaching feet to inspect skin, perform nail and sking care, and bathe feet and distal legs, LB bathing;,contributes. L Chronic leg swelling limits ability to dress and fit LB clothing and street shoes,. Chronic, progressive BLE lymphedema  contributes to impaired body image and depression.    Special Tests  Lymphedema Life Impact Scale (LLIS) score- baseline = 32, which translates to 47% iperceived impairement due to lymphedema over the past week    Patient Stated Goals  get the swelling and pain in my legs down so I can walk more and wear normal shoes    Currently in Pain?  No/denies    Pain Onset  More than a month ago > 20 years                   OT Treatments/Exercises (OP) - 06/06/17 0001      ADLs   ADL Education Given  Yes      Manual Therapy   Manual Therapy  Compression Bandaging;Manual Lymphatic Drainage (MLD);Edema management    Manual therapy comments  skin care during MLD w/ low ph castor oil to improve hyration     Edema Management  modified post op shoe staying in place well    Manual Lymphatic Drainage (MLD)  MLD to LLE utilizing  short neck sequence, deep abdominal breathing, functional healthy inguinal LN, and J strokes to leg segments and foot.     Compression Bandaging  LLE compression wraps applied from toes to below knee: Toe wrap to foot and digits 1-3 using Transelast Classic. Cotton stockinett under  8 cm x 5 m x 1 to foot and ankle, then 10 cm  x 5 m x 2 applied circumferentially in custommary layered gradient configuration. All wraps applied over  over  0.4 cm Rosidal foam to below knee.             OT Education - 06/06/17 1657    Education provided  Yes    Education Details  Continued skilled Pt/caregiver education  And LE ADL training throughout visit for lymphedema self care/ home program, including compression wrapping, compression garment and device wear/care,  lymphatic pumping ther ex, simple self-MLD, and skin care. Discussed progress towards goals.     Person(s) Educated  Patient    Methods  Explanation;Demonstration    Comprehension  Verbalized understanding;Returned demonstration          OT Long Term Goals - 06/05/17 1100      OT LONG TERM GOAL #1   Title  Pt modified independent w/ lymphedema precautions/prevention principals and using printed reference to limit LE progression and infection risk.    Baseline  Max A    Time  10    Period  Days    Status  Achieved      OT LONG TERM GOAL #2   Title  Lymphedema (LE) management/ self-care: Pt able to apply knee -length gradient compression wraps with extra time and printed reference PRN (modified independence) within 10 visits to achieve optimal limb volume reduction during Intensive Phase CDT.    Baseline  dependent    Time  10    Period  Days    Status  Partially Met      OT LONG TERM GOAL #3   Title  Lymphedema (LE) management/ self-care:  Pt to achieve at least 10% limb volume reductions below knees bilaterally during Intensive Phase CDT to limit LE progression, to improve tissue integrity, to decrease infection risk and to improve functional arm and hand use essential for basic and instrumental ADLs performance.    Baseline  dependent    Time  12    Period  Weeks    Status  Partially Met      OT LONG TERM GOAL #4   Title  Lymphedema (LE) management/ self-care:  Pt to tolerate daily compression wraps, compression garments and/ or HOS devices in keeping w/ prescribed wear regime within 1 week of issue date of each to progress and retain clinical and functional gains and to limit LE progression.    Baseline  Max A    Time  12    Period  Weeks    Status  Partially Met      OT LONG TERM GOAL #5   Title  Lymphedema (LE) management/ self-care:  During Management Phase CDT Pt to sustain current limb volumes within 5%, and all other clinical gains achieved during OT treatment  independently (to control limb swelling and associated pain, to  limit LE progression, to decrease infection risk and to limit further functional decline.    Baseline  Max A    Time  6    Period  Months    Status  On-going            Plan -  06/06/17 1658    Clinical Impression Statement  Provided MLD and skin care as emphasis of session. Pt tolerated manual therapy without difficulty. Skin condition continues to improve and swelling to visibly reduce. Tisse density remains fatty and fibrotic  , but density continues to decrease by palpation.  Pt making progress on compression wrapping goal. Cont as per POC.    Occupational performance deficits (Please refer to evaluation for details):  ADL's;IADL's;Work;Leisure;Social Participation;Other body image    Rehab Potential  Good    OT Frequency  3x / week    OT Duration  12 weeks    OT Treatment/Interventions  Self-care/ADL training;Therapeutic exercise;Manual Therapy;Manual lymph drainage;Therapeutic activities;DME and/or AE instruction;Compression bandaging;Other (comment);Patient/family education skin care with low ph lotion and / or castor oil to improve skin hydration and flexibility    Clinical Decision Making  Several treatment options, min-mod task modification necessary    Recommended Other Services  fit with knee length, custom, ccl 2-3, flat knit compression stockings, and knee length HOS devices to facilitate improved lymphatic function and decrease fibrosis formation during HOS    Consulted and Agree with Plan of Care  Patient       Patient will benefit from skilled therapeutic intervention in order to improve the following deficits and impairments:  Decreased skin integrity, Decreased knowledge of precautions, Decreased knowledge of use of DME, Impaired flexibility, Decreased balance, Decreased mobility, Obesity, Decreased range of motion, Increased edema, Pain  Visit Diagnosis: Lymphedema, not elsewhere classified    Problem  List Patient Active Problem List   Diagnosis Date Noted  . Endometrial cancer ([place]) 04/10/2017  . 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 Chronic acquired lymphedema 01/08/2017  . Obesity, morbid, BMI 40.0-49.9 ([place]) 11/13/2016  . FIGO stage II endometrial cancer ([place]) 10/29/2016  . Left ventricular hypertrophy 11/27/2014  . Lymphedema of lower extremity 08/23/2014  . Severe aortic stenosis 08/21/2014  . Benign essential HTN 08/21/2014  . Depression, major, recurrent, mild ([place]) 08/21/2014  . Dyslipidemia 08/21/2014  . Cardiac dysrhythmia 08/21/2014  . Dysmetabolic syndrome 37/79/3968  . Symptomatic menopausal or female climacteric states 08/21/2014  . 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 History of shingles 08/21/2014  . Personal history of skin cancer 11/24/2009    Andrey Spearman, MS, OTR/L, [facility] 06/06/17 5:02 PM  [place] MAIN [facility] SERVICES [address] [place], [place], [address] Phone: [phone]   Fax:  [phone]  Name: Melody Soto MRN: [id number] Date of Birth: November 16, 1950

## 2017-06-10 ENCOUNTER — Ambulatory Visit: Payer: Medicare HMO | Admitting: Occupational Therapy

## 2017-06-10 DIAGNOSIS — I890 Lymphedema, not elsewhere classified: Secondary | ICD-10-CM | POA: Diagnosis not present

## 2017-06-11 ENCOUNTER — Ambulatory Visit: Payer: Medicare HMO | Admitting: Occupational Therapy

## 2017-06-11 DIAGNOSIS — I890 Lymphedema, not elsewhere classified: Secondary | ICD-10-CM | POA: Diagnosis not present

## 2017-06-11 NOTE — Therapy (Signed)
[place] MAIN [facility] SERVICES [address] [place], [place], [address] Phone: [phone]   Fax:  [phone]  Occupational Therapy Treatment  Patient Details  Name: Melody Soto MRN: [id number] Date of Birth: 03/27/1950 Referring Provider: Steele Sizer, MD   Encounter Date: 06/11/2017  OT End of Session - 06/11/17 1221    Visit Number  12    Number of Visits  36    Date for OT Re-Evaluation  07/30/17    OT Start Time  1115    OT Stop Time  1220    OT Time Calculation (min)  65 min    Activity Tolerance  Patient tolerated treatment well;No increased pain    Behavior During Therapy  WFL for tasks assessed/performed       Past Medical History:  Diagnosis Date  . Aortic stenosis   . Cataract   . Depression   . Endometrial cancer determined by uterine biopsy ([place]) 10/29/2016  . Heart murmur    HX OF  . Hyperlipidemia   . Hypertension    CONTROLLED ON MEDS  . Lymphedema   . Shortness of breath dyspnea     Past Surgical History:  Procedure Laterality Date  . 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 cataract surgery Bilateral 05/24/2014   second eye 06/07/2014  . MELANOMA EXCISION    . OVARIAN CYST REMOVAL     EXPLORATORY LAPAROTOMY  . TONSILLECTOMY      There were no vitals filed for this visit.  Subjective Assessment - 06/11/17 1132    Subjective   Melody Soto presents for Occupational Therapy visit 12/36 to address BLE with CDT. Pt presents with wraps in place. Pt c/o post op shoe getting lose and flopping around when walking.   (Pended)     Pertinent History  BLE swelling w/ insideous onset in early 40's- no known precipitating event; no known family hx of limb swelling; Transcatheter aortic valve replacement 02/2017;  Hx depression; orbid obesity; HTN; FIGO stage II Endometrial cancer (HCC); external XRT and brachytherapy complete; hx melanoma excision; urinary incontinence,   (Pended)     Limitations  chronic  leg pain and swelling, R>L; limits ability to participate in activities requiring standing and walking > 30 minutes, ; contributes to difficulty reaching feet to inspect skin, perform nail and sking care, and bathe feet and distal legs, LB bathing;,contributes. L Chronic leg swelling limits ability to dress and fit LB clothing and street shoes,. Chronic, progressive BLE lymphedema  contributes to impaired body image and depression.  (Pended)     Special Tests  Lymphedema Life Impact Scale (LLIS) score- baseline = 32, which translates to 47% iperceived impairement due to lymphedema over the past week  (Pended)     Patient Stated Goals  get the swelling and pain in my legs down so I can walk more and wear normal shoes  (Pended)     Currently in Pain?  No/denies  (Pended)     Pain Onset  More than a month ago  (Pended)  > 20 years                           OT Education - 06/11/17 1222    Education provided  Yes    Education Details  Pt edu for post op shoe options and on line resources. Printed handout given for high tope cast shoe that we hope will stay in place better than  the she she currently uses    Person(s) Educated  Patient    Methods  Explanation;Demonstration;Handout    Comprehension  Verbalized understanding;Returned demonstration          OT Long Term Goals - 06/05/17 1100      OT LONG TERM GOAL #1   Title  Pt modified independent w/ lymphedema precautions/prevention principals and using printed reference to limit LE progression and infection risk.    Baseline  Max A    Time  10    Period  Days    Status  Achieved      OT LONG TERM GOAL #2   Title  Lymphedema (LE) management/ self-care: Pt able to apply knee -length gradient compression wraps with extra time and printed reference PRN (modified independence) within 10 visits to achieve optimal limb volume reduction during Intensive Phase CDT.    Baseline  dependent    Time  10    Period  Days    Status   Partially Met      OT LONG TERM GOAL #3   Title  Lymphedema (LE) management/ self-care:  Pt to achieve at least 10% limb volume reductions below knees bilaterally during Intensive Phase CDT to limit LE progression, to improve tissue integrity, to decrease infection risk and to improve functional arm and hand use essential for basic and instrumental ADLs performance.    Baseline  dependent    Time  12    Period  Weeks    Status  Partially Met      OT LONG TERM GOAL #4   Title  Lymphedema (LE) management/ self-care:  Pt to tolerate daily compression wraps, compression garments and/ or HOS devices in keeping w/ prescribed wear regime within 1 week of issue date of each to progress and retain clinical and functional gains and to limit LE progression.    Baseline  Max A    Time  12    Period  Weeks    Status  Partially Met      OT LONG TERM GOAL #5   Title  Lymphedema (LE) management/ self-care:  During Management Phase CDT Pt to sustain current limb volumes within 5%, and all other clinical gains achieved during OT treatment independently (to control limb swelling and associated pain, to  limit LE progression, to decrease infection risk and to limit further functional decline.    Baseline  Max A    Time  6    Period  Months    Status  On-going            Plan - 06/11/17 1223    Clinical Impression Statement  First half of session devoted to trouble shooting problems with low top post op shoe . Pt's ankle circumference is so wide when wrapped the shoe will not remain in place as the heel portion    tends to collape as she walks. OT provided web resource for high top ankle length p cast shoe . Pt plans to complete additional research on prices on line and purchase to replace existing shoe. This is important due to fall risk with existing shoe fit and breakdown. Pt tolerated MLD, skin care and compression wrapping. Cont as per POC.    Occupational performance deficits (Please refer to  evaluation for details):  ADL's;IADL's;Work;Leisure;Social Participation;Other body image    Rehab Potential  Good    OT Frequency  3x / week    OT Duration  12 weeks    OT Treatment/Interventions  Self-care/ADL training;Therapeutic exercise;Manual Therapy;Manual lymph drainage;Therapeutic activities;DME and/or AE instruction;Compression bandaging;Other (comment);Patient/family education skin care with low ph lotion and / or castor oil to improve skin hydration and flexibility    Clinical Decision Making  Several treatment options, min-mod task modification necessary    Recommended Other Services  fit with knee length, custom, ccl 2-3, flat knit compression stockings, and knee length HOS devices to facilitate improved lymphatic function and decrease fibrosis formation during HOS    Consulted and Agree with Plan of Care  Patient       Patient will benefit from skilled therapeutic intervention in order to improve the following deficits and impairments:  Decreased skin integrity, Decreased knowledge of precautions, Decreased knowledge of use of DME, Impaired flexibility, Decreased balance, Decreased mobility, Obesity, Decreased range of motion, Increased edema, Pain  Visit Diagnosis: Lymphedema, not elsewhere classified    Problem List Patient Active Problem List   Diagnosis Date Noted  . Endometrial cancer ([place]) 04/10/2017  . 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 Chronic acquired lymphedema 01/08/2017  . Obesity, morbid, BMI 40.0-49.9 ([place]) 11/13/2016  . FIGO stage II endometrial cancer ([place]) 10/29/2016  . Left ventricular hypertrophy 11/27/2014  . Lymphedema of lower extremity 08/23/2014  . Severe aortic stenosis 08/21/2014  . Benign essential HTN 08/21/2014  . Depression, major, recurrent, mild ([place]) 08/21/2014  . Dyslipidemia 08/21/2014  . Cardiac dysrhythmia 08/21/2014  . Dysmetabolic syndrome 05/25/6436  . Symptomatic menopausal or female climacteric  states 08/21/2014  . 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 History of shingles 08/21/2014  . Personal history of skin cancer 11/24/2009    Andrey Spearman, MS, OTR/L, [facility] 06/11/17 12:27 PM  [place] MAIN [facility] SERVICES [address] [place], [place], [address] Phone: [phone]   Fax:  [phone]  Name: Melody Soto MRN: [id number] Date of Birth: 12-20-1950

## 2017-06-11 NOTE — Therapy (Signed)
[place] MAIN [facility] SERVICES [address] [place], [place], [address] Phone: [phone]   Fax:  [phone]  Occupational Therapy Treatment Note and Progress Report: Lymphedema Care  Patient Details  Name: Melody Soto MRN: [id number] Date of Birth: 1950/12/06 Referring Provider: Steele Sizer, MD   Encounter Date: 06/10/2017  OT End of Session - 06/11/17 1221    Visit Number  12    Number of Visits  36    Date for OT Re-Evaluation  07/30/17    OT Start Time  1115    OT Stop Time  1220    OT Time Calculation (min)  65 min    Activity Tolerance  Patient tolerated treatment well;No increased pain    Behavior During Therapy  WFL for tasks assessed/performed       Past Medical History:  Diagnosis Date  . Aortic stenosis   . Cataract   . Depression   . Endometrial cancer determined by uterine biopsy ([place]) 10/29/2016  . Heart murmur    HX OF  . Hyperlipidemia   . Hypertension    CONTROLLED ON MEDS  . Lymphedema   . Shortness of breath dyspnea     Past Surgical History:  Procedure Laterality Date  . 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 cataract surgery Bilateral 05/24/2014   second eye 06/07/2014  . MELANOMA EXCISION    . OVARIAN CYST REMOVAL     EXPLORATORY LAPAROTOMY  . TONSILLECTOMY      There were no vitals filed for this visit.  Subjective Assessment - 06/11/17 1132    Subjective   Melody Soto presents for Occupational Therapy visit 12/36 to address BLE with CDT. Pt presents with wraps in place. Pt c/o post op shoe getting lose and flopping around when walking.   (Pended)     Pertinent History  BLE swelling w/ insideous onset in early 40's- no known precipitating event; no known family hx of limb swelling; Transcatheter aortic valve replacement 02/2017;  Hx depression; orbid obesity; HTN; FIGO stage II Endometrial cancer (HCC); external XRT and brachytherapy complete; hx melanoma excision; urinary  incontinence,   (Pended)     Limitations  chronic leg pain and swelling, R>L; limits ability to participate in activities requiring standing and walking > 30 minutes, ; contributes to difficulty reaching feet to inspect skin, perform nail and sking care, and bathe feet and distal legs, LB bathing;,contributes. L Chronic leg swelling limits ability to dress and fit LB clothing and street shoes,. Chronic, progressive BLE lymphedema  contributes to impaired body image and depression.  (Pended)     Special Tests  Lymphedema Life Impact Scale (LLIS) score- baseline = 32, which translates to 47% iperceived impairement due to lymphedema over the past week  (Pended)     Patient Stated Goals  get the swelling and pain in my legs down so I can walk more and wear normal shoes  (Pended)     Currently in Pain?  No/denies  (Pended)     Pain Onset  More than a month ago  (Pended)  > 20 years                   OT Treatments/Exercises (OP) - 06/10/17 1625      ADLs   ADL Education Given  Yes      Manual Therapy   Manual Therapy  Compression Bandaging;Manual Lymphatic Drainage (MLD);Edema management    Manual therapy comments  skin care during MLD  w/ low ph castor oil to improve hyration     Edema Management  modified post op shoe staying in place well    Manual Lymphatic Drainage (MLD)  MLD to LLE utilizing short neck sequence, deep abdominal breathing, functional healthy inguinal LN, and J strokes to leg segments and foot.     Compression Bandaging  LLE compression wraps applied from toes to below knee: Toe wrap to foot and digits 1-3 using Transelast Classic. Cotton stockinett under  8 cm x 5 m x 1 to foot and ankle, then 10 cm  x 5 m x 2 applied circumferentially in custommary layered gradient configuration. All wraps applied over  over  0.4 cm Rosidal foam to below knee.             OT Education - 06/11/17 1222    Education provided  Yes    Education Details  Pt edu for post op shoe  options and on line resources. Printed handout given for high tope cast shoe that we hope will stay in place better than the she she currently uses    Person(s) Educated  Patient    Methods  Explanation;Demonstration;Handout    Comprehension  Verbalized understanding;Returned demonstration          OT Long Term Goals - 06/10/17 1630      OT LONG TERM GOAL #1   Title  Pt modified independent w/ lymphedema precautions/prevention principals and using printed reference to limit LE progression and infection risk.    Baseline  Max A    Time  10    Period  Days    Status  Achieved      OT LONG TERM GOAL #2   Title  Lymphedema (LE) management/ self-care: Pt able to apply knee -length gradient compression wraps with extra time and printed reference PRN (modified independence) within 10 visits to achieve optimal limb volume reduction during Intensive Phase CDT.    Baseline  dependent    Time  10    Period  Days    Status  Achieved      OT LONG TERM GOAL #3   Title  Lymphedema (LE) management/ self-care:  Pt to achieve at least 10% limb volume reductions below knees bilaterally during Intensive Phase CDT to limit LE progression, to improve tissue integrity, to decrease infection risk and to improve functional arm and hand use essential for basic and instrumental ADLs performance.    Baseline  dependent  06/10/2017: Achieved for RLE leg  . Partially cachieved for LLE thigh and overall LVR    Time  12    Period  Weeks    Status  Partially Met      OT LONG TERM GOAL #4   Title  Lymphedema (LE) management/ self-care:  Pt to tolerate daily compression wraps, compression garments and/ or HOS devices in keeping w/ prescribed wear regime within 1 week of issue date of each to progress and retain clinical and functional gains and to limit LE progression.    Baseline  Max A    Time  12    Period  Weeks    Status  Partially Met      OT LONG TERM GOAL #5   Title  Lymphedema (LE) management/  self-care:  During Management Phase CDT Pt to sustain current limb volumes within 5%, and all other clinical gains achieved during OT treatment independently (to control limb swelling and associated pain, to  limit LE progression, to decrease infection risk  and to limit further functional decline.    Baseline  Max A    Time  6    Period  Months    Status  On-going            Plan - 06/11/17 1223    Clinical Impression Statement  First half of session devoted to trouble shooting problems with low top post op shoe . Pt's ankle circumference is so wide when wrapped the shoe will not remain in place as the heel portion    tends to collape as she walks. OT provided web resource for high top ankle length p cast shoe . Pt plans to complete additional research on prices on line and purchase to replace existing shoe. This is important due to fall risk with existing shoe fit and breakdown. Pt tolerated MLD, skin care and compression wrapping. Cont as per POC.    Occupational performance deficits (Please refer to evaluation for details):  ADL's;IADL's;Work;Leisure;Social Participation;Other body image    Rehab Potential  Good    OT Frequency  3x / week    OT Duration  12 weeks    OT Treatment/Interventions  Self-care/ADL training;Therapeutic exercise;Manual Therapy;Manual lymph drainage;Therapeutic activities;DME and/or AE instruction;Compression bandaging;Other (comment);Patient/family education skin care with low ph lotion and / or castor oil to improve skin hydration and flexibility    Clinical Decision Making  Several treatment options, min-mod task modification necessary    Recommended Other Services  fit with knee length, custom, ccl 2-3, flat knit compression stockings, and knee length HOS devices to facilitate improved lymphatic function and decrease fibrosis formation during HOS    Consulted and Agree with Plan of Care  Patient       Patient will benefit from skilled therapeutic intervention  in order to improve the following deficits and impairments:  Decreased skin integrity, Decreased knowledge of precautions, Decreased knowledge of use of DME, Impaired flexibility, Decreased balance, Decreased mobility, Obesity, Decreased range of motion, Increased edema, Pain  Visit Diagnosis: Lymphedema, not elsewhere classified    Problem List Patient Active Problem List   Diagnosis Date Noted  . Endometrial cancer ([place]) 04/10/2017  . 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 Chronic acquired lymphedema 01/08/2017  . Obesity, morbid, BMI 40.0-49.9 ([place]) 11/13/2016  . FIGO stage II endometrial cancer ([place]) 10/29/2016  . Left ventricular hypertrophy 11/27/2014  . Lymphedema of lower extremity 08/23/2014  . Severe aortic stenosis 08/21/2014  . Benign essential HTN 08/21/2014  . Depression, major, recurrent, mild ([place]) 08/21/2014  . Dyslipidemia 08/21/2014  . Cardiac dysrhythmia 08/21/2014  . Dysmetabolic syndrome 93/81/0175  . Symptomatic menopausal or female climacteric states 08/21/2014  . 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 History of shingles 08/21/2014  . Personal history of skin cancer 11/24/2009    Andrey Spearman, MS, OTR/L, [facility] 06/11/17 4:34 PM  [place] MAIN [facility] SERVICES [address] [place], [place], [address] Phone: [phone]   Fax:  [phone]  Name: Melody Soto MRN: [id number] Date of Birth: February 06, 1951

## 2017-06-13 ENCOUNTER — Ambulatory Visit: Payer: Medicare HMO | Attending: Family Medicine | Admitting: Occupational Therapy

## 2017-06-13 DIAGNOSIS — I890 Lymphedema, not elsewhere classified: Secondary | ICD-10-CM | POA: Diagnosis not present

## 2017-06-13 NOTE — Therapy (Signed)
[place] MAIN [facility] SERVICES [address] [place], [place], [address] Phone: [phone]   Fax:  [phone]  Occupational Therapy Treatment  Patient Details  Name: Melody Soto MRN: [id number] Date of Birth: 05/04/1950 Referring Provider: Steele Sizer, MD   Encounter Date: 06/13/2017  OT End of Session - 06/13/17 1656    Visit Number  13    Number of Visits  36    Date for OT Re-Evaluation  07/30/17    OT Start Time  1108    OT Stop Time  1210    OT Time Calculation (min)  62 min    Activity Tolerance  Patient tolerated treatment well;No increased pain    Behavior During Therapy  WFL for tasks assessed/performed       Past Medical History:  Diagnosis Date  . Aortic stenosis   . Cataract   . Depression   . Endometrial cancer determined by uterine biopsy ([place]) 10/29/2016  . Heart murmur    HX OF  . Hyperlipidemia   . Hypertension    CONTROLLED ON MEDS  . Lymphedema   . Shortness of breath dyspnea     Past Surgical History:  Procedure Laterality Date  . 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 cataract surgery Bilateral 05/24/2014   second eye 06/07/2014  . MELANOMA EXCISION    . OVARIAN CYST REMOVAL     EXPLORATORY LAPAROTOMY  . TONSILLECTOMY      There were no vitals filed for this visit.  Subjective Assessment - 06/13/17 1653    Subjective   Ms. Dini presents for Occupational Therapy visit 13/36 to address BLE with CDT. Pt presents with wraps in place. Pt reports applying compression wraps and cast shoe has gotten  easier for her. with practice this week.    Pertinent History  BLE swelling w/ insideous onset in early 40's- no known precipitating event; no known family hx of limb swelling; Transcatheter aortic valve replacement 02/2017;  Hx depression; orbid obesity; HTN; FIGO stage II Endometrial cancer (HCC); external XRT and brachytherapy complete; hx melanoma excision; urinary incontinence,      Limitations  chronic leg pain and swelling, R>L; limits ability to participate in activities requiring standing and walking > 30 minutes, ; contributes to difficulty reaching feet to inspect skin, perform nail and sking care, and bathe feet and distal legs, LB bathing;,contributes. L Chronic leg swelling limits ability to dress and fit LB clothing and street shoes,. Chronic, progressive BLE lymphedema  contributes to impaired body image and depression.    Special Tests  Lymphedema Life Impact Scale (LLIS) score- baseline = 32, which translates to 47% iperceived impairement due to lymphedema over the past week    Patient Stated Goals  get the swelling and pain in my legs down so I can walk more and wear normal shoes    Currently in Pain?  No/denies    Pain Onset  More than a month ago > 20 years                   OT Treatments/Exercises (OP) - 06/13/17 0001      ADLs   ADL Education Given  Yes      Manual Therapy   Manual Therapy  Compression Bandaging;Manual Lymphatic Drainage (MLD);Edema management    Manual therapy comments  skin care during MLD w/ low ph castor oil to improve hyration     Manual Lymphatic Drainage (MLD)  MLD to LLE utilizing short  neck sequence, deep abdominal breathing, functional healthy inguinal LN, and J strokes to leg segments and foot.     Compression Bandaging  LLE compression wraps applied from toes to groin today: Toe wrap omitted.             OT Education - 06/13/17 1655    Education provided  Yes    Education Details  Cont Pt edu for LE self care, including extending compression wraps above knee to groin on trial basis.     Person(s) Educated  Patient    Methods  Explanation;Demonstration    Comprehension  Verbalized understanding;Returned demonstration;Need further instruction;Other (comment)          OT Long Term Goals - 06/10/17 1630      OT LONG TERM GOAL #1   Title  Pt modified independent w/ lymphedema precautions/prevention  principals and using printed reference to limit LE progression and infection risk.    Baseline  Max A    Time  10    Period  Days    Status  Achieved      OT LONG TERM GOAL #2   Title  Lymphedema (LE) management/ self-care: Pt able to apply knee -length gradient compression wraps with extra time and printed reference PRN (modified independence) within 10 visits to achieve optimal limb volume reduction during Intensive Phase CDT.    Baseline  dependent    Time  10    Period  Days    Status  Achieved      OT LONG TERM GOAL #3   Title  Lymphedema (LE) management/ self-care:  Pt to achieve at least 10% limb volume reductions below knees bilaterally during Intensive Phase CDT to limit LE progression, to improve tissue integrity, to decrease infection risk and to improve functional arm and hand use essential for basic and instrumental ADLs performance.    Baseline  dependent  06/10/2017: Achieved for RLE leg  . Partially cachieved for LLE thigh and overall LVR    Time  12    Period  Weeks    Status  Partially Met      OT LONG TERM GOAL #4   Title  Lymphedema (LE) management/ self-care:  Pt to tolerate daily compression wraps, compression garments and/ or HOS devices in keeping w/ prescribed wear regime within 1 week of issue date of each to progress and retain clinical and functional gains and to limit LE progression.    Baseline  Max A    Time  12    Period  Weeks    Status  Partially Met      OT LONG TERM GOAL #5   Title  Lymphedema (LE) management/ self-care:  During Management Phase CDT Pt to sustain current limb volumes within 5%, and all other clinical gains achieved during OT treatment independently (to control limb swelling and associated pain, to  limit LE progression, to decrease infection risk and to limit further functional decline.    Baseline  Max A    Time  6    Period  Months    Status  On-going            Plan - 06/13/17 1657    Clinical Impression Statement  Pt  feels more confident about her  ability to apply compression wraps after a week of added instruction and practice between sessions. In terms iof treatment response thus far, R Leg continues to decrease in volume slowley, but tissue integrity iis palpably less spongy  and  leg contour is beginning to look more typical above ankle. No treatment response noted at ankle, yet  dorsal foot swelling is slowly decreasing. Pt tolerated MLD, wrapping and skin care without difficulty today. Applied compression wraps above knee to groin in effort to move more tissue fluid prximally towards inguinal LN. Cont as per POC.    Occupational performance deficits (Please refer to evaluation for details):  ADL's;IADL's;Work;Leisure;Social Participation;Other body image    Rehab Potential  Good    OT Frequency  3x / week    OT Duration  12 weeks    OT Treatment/Interventions  Self-care/ADL training;Therapeutic exercise;Manual Therapy;Manual lymph drainage;Therapeutic activities;DME and/or AE instruction;Compression bandaging;Other (comment);Patient/family education skin care with low ph lotion and / or castor oil to improve skin hydration and flexibility    Clinical Decision Making  Several treatment options, min-mod task modification necessary    Recommended Other Services  fit with knee length, custom, ccl 2-3, flat knit compression stockings, and knee length HOS devices to facilitate improved lymphatic function and decrease fibrosis formation during HOS    Consulted and Agree with Plan of Care  Patient       Patient will benefit from skilled therapeutic intervention in order to improve the following deficits and impairments:  Decreased skin integrity, Decreased knowledge of precautions, Decreased knowledge of use of DME, Impaired flexibility, Decreased balance, Decreased mobility, Obesity, Decreased range of motion, Increased edema, Pain  Visit Diagnosis: Lymphedema, not elsewhere classified    Problem List Patient  Active Problem List   Diagnosis Date Noted  . Endometrial cancer ([place]) 04/10/2017  . 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 Chronic acquired lymphedema 01/08/2017  . Obesity, morbid, BMI 40.0-49.9 ([place]) 11/13/2016  . FIGO stage II endometrial cancer ([place]) 10/29/2016  . Left ventricular hypertrophy 11/27/2014  . Lymphedema of lower extremity 08/23/2014  . Severe aortic stenosis 08/21/2014  . Benign essential HTN 08/21/2014  . Depression, major, recurrent, mild ([place]) 08/21/2014  . Dyslipidemia 08/21/2014  . Cardiac dysrhythmia 08/21/2014  . Dysmetabolic syndrome 32/03/3341  . Symptomatic menopausal or female climacteric states 08/21/2014  . 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 History of shingles 08/21/2014  . Personal history of skin cancer 11/24/2009    Andrey Spearman, MS, OTR/L, [facility] 06/13/17 5:02 PM   [place] MAIN [facility] SERVICES [address] [place], [place], [address] Phone: [phone]   Fax:  [phone]  Name: ANAIJAH AUGSBURGER MRN: [id number] Date of Birth: [birth year]

## 2017-06-17 ENCOUNTER — Ambulatory Visit: Payer: Medicare HMO | Admitting: Occupational Therapy

## 2017-06-17 DIAGNOSIS — I890 Lymphedema, not elsewhere classified: Secondary | ICD-10-CM

## 2017-06-17 NOTE — Therapy (Signed)
[place] MAIN [facility] SERVICES [address] [place], [place], [address] Phone: [phone]   Fax:  [phone]  Occupational Therapy Treatment  Patient Details  Name: Melody Soto MRN: [id number] Date of Birth: 1950/12/18 Referring Provider: Steele Sizer, MD   Encounter Date: 06/17/2017  OT End of Session - 06/17/17 1321    Visit Number  14    Number of Visits  36    Date for OT Re-Evaluation  07/30/17    OT Start Time  1000    OT Stop Time  1110    OT Time Calculation (min)  70 min    Activity Tolerance  Patient tolerated treatment well;No increased pain    Behavior During Therapy  WFL for tasks assessed/performed       Past Medical History:  Diagnosis Date  . Aortic stenosis   . Cataract   . Depression   . Endometrial cancer determined by uterine biopsy ([place]) 10/29/2016  . Heart murmur    HX OF  . Hyperlipidemia   . Hypertension    CONTROLLED ON MEDS  . Lymphedema   . Shortness of breath dyspnea     Past Surgical History:  Procedure Laterality Date  . 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 cataract surgery Bilateral 05/24/2014   second eye 06/07/2014  . MELANOMA EXCISION    . OVARIAN CYST REMOVAL     EXPLORATORY LAPAROTOMY  . TONSILLECTOMY      There were no vitals filed for this visit.                        OT Education - 06/17/17 1318    Education provided  Yes    Education Details  Continued skilled Pt/caregiver education  And LE ADL training throughout visit for lymphedema self care/ home program, including compression wrapping, compression garment and device wear/care, lymphatic pumping ther ex, simple self-MLD, and skin care. Discussed progress towards goals.     Person(s) Educated  Patient    Methods  Explanation;Demonstration    Comprehension  Verbalized understanding;Need further instruction;Returned demonstration          OT Long Term Goals - 06/10/17 1630      OT LONG TERM GOAL #1   Title  Pt modified independent w/ lymphedema precautions/prevention principals and using printed reference to limit LE progression and infection risk.    Baseline  Max A    Time  10    Period  Days    Status  Achieved      OT LONG TERM GOAL #2   Title  Lymphedema (LE) management/ self-care: Pt able to apply knee -length gradient compression wraps with extra time and printed reference PRN (modified independence) within 10 visits to achieve optimal limb volume reduction during Intensive Phase CDT.    Baseline  dependent    Time  10    Period  Days    Status  Achieved      OT LONG TERM GOAL #3   Title  Lymphedema (LE) management/ self-care:  Pt to achieve at least 10% limb volume reductions below knees bilaterally during Intensive Phase CDT to limit LE progression, to improve tissue integrity, to decrease infection risk and to improve functional arm and hand use essential for basic and instrumental ADLs performance.    Baseline  dependent  06/10/2017: Achieved for RLE leg  . Partially cachieved for LLE thigh and overall LVR    Time  12  Period  Weeks    Status  Partially Met      OT LONG TERM GOAL #4   Title  Lymphedema (LE) management/ self-care:  Pt to tolerate daily compression wraps, compression garments and/ or HOS devices in keeping w/ prescribed wear regime within 1 week of issue date of each to progress and retain clinical and functional gains and to limit LE progression.    Baseline  Max A    Time  12    Period  Weeks    Status  Partially Met      OT LONG TERM GOAL #5   Title  Lymphedema (LE) management/ self-care:  During Management Phase CDT Pt to sustain current limb volumes within 5%, and all other clinical gains achieved during OT treatment independently (to control limb swelling and associated pain, to  limit LE progression, to decrease infection risk and to limit further functional decline.    Baseline  Max A    Time  6    Period  Months    Status   On-going            Plan - 06/17/17 1321    Clinical Impression Statement  Pt reports she was able to tolerate thigh length compression for 18 of 24 hours before taking wraps off. Pt states she would rather focus on swelling below the knees in treatment, so we'll continue with the original POC. Weight loss in conjunction with LE Rx  is giving slow but steady progress towards  OT goals for LE self care. Lipo-lymphedema component is slow to respond while reduction in edema is visible. Pt tolerated MLD today withuot difficulty. Applied compression from toes to below knee. Next session we'll use custom fabricated chip bag/ muff at ankle in effort to reduce concentrated fatty fibrosis in this area.    Occupational performance deficits (Please refer to evaluation for details):  ADL's;IADL's;Work;Leisure;Social Participation;Other body image    Rehab Potential  Good    OT Frequency  3x / week    OT Duration  12 weeks    OT Treatment/Interventions  Self-care/ADL training;Therapeutic exercise;Manual Therapy;Manual lymph drainage;Therapeutic activities;DME and/or AE instruction;Compression bandaging;Other (comment);Patient/family education skin care with low ph lotion and / or castor oil to improve skin hydration and flexibility    Clinical Decision Making  Several treatment options, min-mod task modification necessary    Recommended Other Services  fit with knee length, custom, ccl 2-3, flat knit compression stockings, and knee length HOS devices to facilitate improved lymphatic function and decrease fibrosis formation during HOS    Consulted and Agree with Plan of Care  Patient       Patient will benefit from skilled therapeutic intervention in order to improve the following deficits and impairments:  Decreased skin integrity, Decreased knowledge of precautions, Decreased knowledge of use of DME, Impaired flexibility, Decreased balance, Decreased mobility, Obesity, Decreased range of motion, Increased  edema, Pain  Visit Diagnosis: Lymphedema, not elsewhere classified    Problem List Patient Active Problem List   Diagnosis Date Noted  . Endometrial cancer ([place]) 04/10/2017  . 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 Chronic acquired lymphedema 01/08/2017  . Obesity, morbid, BMI 40.0-49.9 ([place]) 11/13/2016  . FIGO stage II endometrial cancer ([place]) 10/29/2016  . Left ventricular hypertrophy 11/27/2014  . Lymphedema of lower extremity 08/23/2014  . Severe aortic stenosis 08/21/2014  . Benign essential HTN 08/21/2014  . Depression, major, recurrent, mild ([place]) 08/21/2014  . Dyslipidemia 08/21/2014  . Cardiac dysrhythmia  08/21/2014  . Dysmetabolic syndrome 00/86/7619  . Symptomatic menopausal or female climacteric states 08/21/2014  . 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 History of shingles 08/21/2014  . Personal history of skin cancer 11/24/2009    Andrey Spearman, MS, OTR/L, [facility] 06/17/17 1:26 PM  [place] MAIN [facility] SERVICES [address] [place], [place], [address] Phone: [phone]   Fax:  [phone]  Name: Melody Soto MRN: [id number] Date of Birth: 1950/04/01

## 2017-06-18 ENCOUNTER — Ambulatory Visit: Payer: Medicare HMO | Admitting: Occupational Therapy

## 2017-06-18 DIAGNOSIS — I890 Lymphedema, not elsewhere classified: Secondary | ICD-10-CM | POA: Diagnosis not present

## 2017-06-18 NOTE — Therapy (Signed)
[place] MAIN [facility] SERVICES [address] [place], [place], [address] Phone: [phone]   Fax:  [phone]  Occupational Therapy Treatment  Patient Details  Name: Melody Soto MRN: [id number] Date of Birth: 03-14-1950 Referring Provider: Steele Sizer, MD   Encounter Date: 06/18/2017  OT End of Session - 06/18/17 1306    Visit Number  15    Number of Visits  36    Date for OT Re-Evaluation  07/30/17    OT Start Time  1115    OT Stop Time  1215    OT Time Calculation (min)  60 min    Activity Tolerance  Patient tolerated treatment well;No increased pain    Behavior During Therapy  WFL for tasks assessed/performed       Past Medical History:  Diagnosis Date  . Aortic stenosis   . Cataract   . Depression   . Endometrial cancer determined by uterine biopsy ([place]) 10/29/2016  . Heart murmur    HX OF  . Hyperlipidemia   . Hypertension    CONTROLLED ON MEDS  . Lymphedema   . Shortness of breath dyspnea     Past Surgical History:  Procedure Laterality Date  . 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 cataract surgery Bilateral 05/24/2014   second eye 06/07/2014  . MELANOMA EXCISION    . OVARIAN CYST REMOVAL     EXPLORATORY LAPAROTOMY  . TONSILLECTOMY      There were no vitals filed for this visit.  Subjective Assessment - 06/18/17 1303    Subjective   Ms. Massar presents for Occupational Therapy visit 14/36 to address BLE with CDT. Pt presents with wraps in place. Pt has no new complaints today. "I ordered that boot we looked at on line."    Pertinent History  BLE swelling w/ insideous onset in early 40's- no known precipitating event; no known family hx of limb swelling; Transcatheter aortic valve replacement 02/2017;  Hx depression; orbid obesity; HTN; FIGO stage II Endometrial cancer (HCC); external XRT and brachytherapy complete; hx melanoma excision; urinary incontinence,     Limitations  chronic leg pain and  swelling, R>L; limits ability to participate in activities requiring standing and walking > 30 minutes, ; contributes to difficulty reaching feet to inspect skin, perform nail and sking care, and bathe feet and distal legs, LB bathing;,contributes. L Chronic leg swelling limits ability to dress and fit LB clothing and street shoes,. Chronic, progressive BLE lymphedema  contributes to impaired body image and depression.    Special Tests  Lymphedema Life Impact Scale (LLIS) score- baseline = 32, which translates to 47% iperceived impairement due to lymphedema over the past week    Patient Stated Goals  get the swelling and pain in my legs down so I can walk more and wear normal shoes    Currently in Pain?  No/denies    Pain Onset  More than a month ago > 20 years                   OT Treatments/Exercises (OP) - 06/18/17 0001      ADLs   ADL Education Given  Yes      Manual Therapy   Manual Therapy  Compression Bandaging;Manual Lymphatic Drainage (MLD);Edema management    Manual therapy comments  skin care during MLD w/ low ph castor oil to improve hyration     Manual Lymphatic Drainage (MLD)  MLD to LLE utilizing short neck sequence, deep  abdominal breathing, functional healthy inguinal LN, and J strokes to leg segments and foot.     Compression Bandaging  LLE compression wraps applied from toes to groin today: Toe wrap omitted.             OT Education - 06/18/17 1305    Education provided  Yes    Education Details  Continued skilled Pt/caregiver education  And LE ADL training throughout visit for lymphedema self care/ home program, including compression wrapping, compression garment and device wear/care, lymphatic pumping ther ex, simple self-MLD, and skin care. Discussed progress towards goals.    Person(s) Educated  Patient    Methods  Explanation;Demonstration    Comprehension  Verbalized understanding;Returned demonstration          OT Long Term Goals - 06/10/17  1630      OT LONG TERM GOAL #1   Title  Pt modified independent w/ lymphedema precautions/prevention principals and using printed reference to limit LE progression and infection risk.    Baseline  Max A    Time  10    Period  Days    Status  Achieved      OT LONG TERM GOAL #2   Title  Lymphedema (LE) management/ self-care: Pt able to apply knee -length gradient compression wraps with extra time and printed reference PRN (modified independence) within 10 visits to achieve optimal limb volume reduction during Intensive Phase CDT.    Baseline  dependent    Time  10    Period  Days    Status  Achieved      OT LONG TERM GOAL #3   Title  Lymphedema (LE) management/ self-care:  Pt to achieve at least 10% limb volume reductions below knees bilaterally during Intensive Phase CDT to limit LE progression, to improve tissue integrity, to decrease infection risk and to improve functional arm and hand use essential for basic and instrumental ADLs performance.    Baseline  dependent  06/10/2017: Achieved for RLE leg  . Partially cachieved for LLE thigh and overall LVR    Time  12    Period  Weeks    Status  Partially Met      OT LONG TERM GOAL #4   Title  Lymphedema (LE) management/ self-care:  Pt to tolerate daily compression wraps, compression garments and/ or HOS devices in keeping w/ prescribed wear regime within 1 week of issue date of each to progress and retain clinical and functional gains and to limit LE progression.    Baseline  Max A    Time  12    Period  Weeks    Status  Partially Met      OT LONG TERM GOAL #5   Title  Lymphedema (LE) management/ self-care:  During Management Phase CDT Pt to sustain current limb volumes within 5%, and all other clinical gains achieved during OT treatment independently (to control limb swelling and associated pain, to  limit LE progression, to decrease infection risk and to limit further functional decline.    Baseline  Max A    Time  6    Period   Months    Status  On-going            Plan - 06/18/17 1306    Clinical Impression Statement  Provided MLD to LLE . Applied custom fabricated chip bag at ankle. under rosidal foam and compression wrap as established in efforrt to reduce fatty fibrosis. Pt instructed to remove if device causes  discomfort or skin irritation. Cont as per POC.    Occupational performance deficits (Please refer to evaluation for details):  ADL's;IADL's;Work;Leisure;Social Participation;Other body image    Rehab Potential  Good    OT Frequency  3x / week    OT Duration  12 weeks    OT Treatment/Interventions  Self-care/ADL training;Therapeutic exercise;Manual Therapy;Manual lymph drainage;Therapeutic activities;DME and/or AE instruction;Compression bandaging;Other (comment);Patient/family education skin care with low ph lotion and / or castor oil to improve skin hydration and flexibility    Clinical Decision Making  Several treatment options, min-mod task modification necessary    Recommended Other Services  fit with knee length, custom, ccl 2-3, flat knit compression stockings, and knee length HOS devices to facilitate improved lymphatic function and decrease fibrosis formation during HOS    Consulted and Agree with Plan of Care  Patient       Patient will benefit from skilled therapeutic intervention in order to improve the following deficits and impairments:  Decreased skin integrity, Decreased knowledge of precautions, Decreased knowledge of use of DME, Impaired flexibility, Decreased balance, Decreased mobility, Obesity, Decreased range of motion, Increased edema, Pain  Visit Diagnosis: Lymphedema, not elsewhere classified    Problem List Patient Active Problem List   Diagnosis Date Noted  . Endometrial cancer ([place]) 04/10/2017  . 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 Chronic acquired lymphedema 01/08/2017  . Obesity, morbid, BMI 40.0-49.9 ([place]) 11/13/2016  . FIGO stage II  endometrial cancer ([place]) 10/29/2016  . Left ventricular hypertrophy 11/27/2014  . Lymphedema of lower extremity 08/23/2014  . Severe aortic stenosis 08/21/2014  . Benign essential HTN 08/21/2014  . Depression, major, recurrent, mild ([place]) 08/21/2014  . Dyslipidemia 08/21/2014  . Cardiac dysrhythmia 08/21/2014  . Dysmetabolic syndrome 24/40/1027  . Symptomatic menopausal or female climacteric states 08/21/2014  . 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 History of shingles 08/21/2014  . Personal history of skin cancer 11/24/2009    Andrey Spearman, MS, OTR/L, [facility] 06/18/17 1:11 PM   [place] MAIN [facility] SERVICES [address] [place], [place], [address] Phone: [phone]   Fax:  [phone]  Name: Melody Soto MRN: [id number] Date of Birth: May 27, 1950

## 2017-06-20 ENCOUNTER — Ambulatory Visit: Payer: Medicare HMO | Admitting: Occupational Therapy

## 2017-06-20 DIAGNOSIS — I890 Lymphedema, not elsewhere classified: Secondary | ICD-10-CM

## 2017-06-20 NOTE — Therapy (Signed)
[place] MAIN [facility] SERVICES [address] [place], [place], [address] Phone: [phone]   Fax:  [phone]  Occupational Therapy Treatment  Patient Details  Name: Melody Soto MRN: [id number] Date of Birth: 1950-06-09 Referring Provider: Steele Sizer, MD   Encounter Date: 06/20/2017  OT End of Session - 06/20/17 1519    Visit Number  16    Number of Visits  36    Date for OT Re-Evaluation  07/30/17    OT Start Time  1103    OT Stop Time  1215    OT Time Calculation (min)  72 min    Activity Tolerance  Patient tolerated treatment well;No increased pain    Behavior During Therapy  WFL for tasks assessed/performed       Past Medical History:  Diagnosis Date  . Aortic stenosis   . Cataract   . Depression   . Endometrial cancer determined by uterine biopsy ([place]) 10/29/2016  . Heart murmur    HX OF  . Hyperlipidemia   . Hypertension    CONTROLLED ON MEDS  . Lymphedema   . Shortness of breath dyspnea     Past Surgical History:  Procedure Laterality Date  . 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 cataract surgery Bilateral 05/24/2014   second eye 06/07/2014  . MELANOMA EXCISION    . OVARIAN CYST REMOVAL     EXPLORATORY LAPAROTOMY  . TONSILLECTOMY      There were no vitals filed for this visit.  Subjective Assessment - 06/20/17 1516    Subjective   Melody Soto presents for Occupational Therapy visit 15/36 to address BLE with CDT. Pt presents without wrps in place. "I took them off at about 5:30 this morning when I got u-p. My legs were itching so much!"    Pertinent History  BLE swelling w/ insideous onset in early 40's- no known precipitating event; no known family hx of limb swelling; Transcatheter aortic valve replacement 02/2017;  Hx depression; orbid obesity; HTN; FIGO stage II Endometrial cancer (HCC); external XRT and brachytherapy complete; hx melanoma excision; urinary incontinence,     Limitations   chronic leg pain and swelling, R>L; limits ability to participate in activities requiring standing and walking > 30 minutes, ; contributes to difficulty reaching feet to inspect skin, perform nail and sking care, and bathe feet and distal legs, LB bathing;,contributes. L Chronic leg swelling limits ability to dress and fit LB clothing and street shoes,. Chronic, progressive BLE lymphedema  contributes to impaired body image and depression.    Special Tests  Lymphedema Life Impact Scale (LLIS) score- baseline = 32, which translates to 47% iperceived impairement due to lymphedema over the past week    Patient Stated Goals  get the swelling and pain in my legs down so I can walk more and wear normal shoes    Currently in Pain?  No/denies    Pain Onset  More than a month ago > 20 years                   OT Treatments/Exercises (OP) - 06/20/17 0001      ADLs   ADL Education Given  Yes      Manual Therapy   Manual Therapy  Compression Bandaging;Manual Lymphatic Drainage (MLD);Edema management    Manual therapy comments  skin care during MLD w/ low ph castor oil to improve hyration     Manual Lymphatic Drainage (MLD)  MLD to LLE utilizing  short neck sequence, deep abdominal breathing, functional healthy inguinal LN, and J strokes to leg segments and foot.     Compression Bandaging  LLE compression wraps applied from toes to groin today: Toe wrap omitted.             OT Education - 06/20/17 1519    Education provided  Yes    Education Details  Continued skilled Pt/caregiver education  And LE ADL training throughout visit for lymphedema self care/ home program, including compression wrapping, compression garment and device wear/care, lymphatic pumping ther ex, simple self-MLD, and skin care. Discussed progress towards goals.     Person(s) Educated  Patient    Methods  Explanation;Demonstration    Comprehension  Verbalized understanding;Returned demonstration;Need further  instruction          OT Long Term Goals - 06/10/17 1630      OT LONG TERM GOAL #1   Title  Pt modified independent w/ lymphedema precautions/prevention principals and using printed reference to limit LE progression and infection risk.    Baseline  Max A    Time  10    Period  Days    Status  Achieved      OT LONG TERM GOAL #2   Title  Lymphedema (LE) management/ self-care: Pt able to apply knee -length gradient compression wraps with extra time and printed reference PRN (modified independence) within 10 visits to achieve optimal limb volume reduction during Intensive Phase CDT.    Baseline  dependent    Time  10    Period  Days    Status  Achieved      OT LONG TERM GOAL #3   Title  Lymphedema (LE) management/ self-care:  Pt to achieve at least 10% limb volume reductions below knees bilaterally during Intensive Phase CDT to limit LE progression, to improve tissue integrity, to decrease infection risk and to improve functional arm and hand use essential for basic and instrumental ADLs performance.    Baseline  dependent  06/10/2017: Achieved for RLE leg  . Partially cachieved for LLE thigh and overall LVR    Time  12    Period  Weeks    Status  Partially Met      OT LONG TERM GOAL #4   Title  Lymphedema (LE) management/ self-care:  Pt to tolerate daily compression wraps, compression garments and/ or HOS devices in keeping w/ prescribed wear regime within 1 week of issue date of each to progress and retain clinical and functional gains and to limit LE progression.    Baseline  Max A    Time  12    Period  Weeks    Status  Partially Met      OT LONG TERM GOAL #5   Title  Lymphedema (LE) management/ self-care:  During Management Phase CDT Pt to sustain current limb volumes within 5%, and all other clinical gains achieved during OT treatment independently (to control limb swelling and associated pain, to  limit LE progression, to decrease infection risk and to limit further functional  decline.    Baseline  Max A    Time  6    Period  Months    Status  On-going            Plan - 06/20/17 1519    Clinical Impression Statement  Positive indentations and red marks observed from chip bag applied under wraps last  visit 2 days ago. Pt left wraps in place greater than typical  24 hrs as instructed. After providing MLD as established re-applied wraps without muff to give lkin a chance to recover. Ankle continues to decrease in volume and fibrosis is becoming more palpable as v fluid decreases. Cont as per POC alternating chip bag every other visit as tolerated.     Occupational performance deficits (Please refer to evaluation for details):  ADL's;IADL's;Work;Leisure;Social Participation;Other body image    Rehab Potential  Good    OT Frequency  3x / week    OT Duration  12 weeks    OT Treatment/Interventions  Self-care/ADL training;Therapeutic exercise;Manual Therapy;Manual lymph drainage;Therapeutic activities;DME and/or AE instruction;Compression bandaging;Other (comment);Patient/family education skin care with low ph lotion and / or castor oil to improve skin hydration and flexibility    Clinical Decision Making  Several treatment options, min-mod task modification necessary    Recommended Other Services  fit with knee length, custom, ccl 2-3, flat knit compression stockings, and knee length HOS devices to facilitate improved lymphatic function and decrease fibrosis formation during HOS    Consulted and Agree with Plan of Care  Patient       Patient will benefit from skilled therapeutic intervention in order to improve the following deficits and impairments:  Decreased skin integrity, Decreased knowledge of precautions, Decreased knowledge of use of DME, Impaired flexibility, Decreased balance, Decreased mobility, Obesity, Decreased range of motion, Increased edema, Pain  Visit Diagnosis: Lymphedema, not elsewhere classified    Problem List Patient Active Problem List    Diagnosis Date Noted  . Endometrial cancer ([place]) 04/10/2017  . 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 Chronic acquired lymphedema 01/08/2017  . Obesity, morbid, BMI 40.0-49.9 ([place]) 11/13/2016  . FIGO stage II endometrial cancer ([place]) 10/29/2016  . Left ventricular hypertrophy 11/27/2014  . Lymphedema of lower extremity 08/23/2014  . Severe aortic stenosis 08/21/2014  . Benign essential HTN 08/21/2014  . Depression, major, recurrent, mild ([place]) 08/21/2014  . Dyslipidemia 08/21/2014  . Cardiac dysrhythmia 08/21/2014  . Dysmetabolic syndrome 48/54/6270  . Symptomatic menopausal or female climacteric states 08/21/2014  . 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 History of shingles 08/21/2014  . Personal history of skin cancer 11/24/2009    Andrey Spearman, MS, OTR/L, [facility] 06/20/17 3:23 PM  [place] MAIN [facility] SERVICES [address] [place], [place], [address] Phone: [phone]   Fax:  [phone]  Name: Melody Soto MRN: [id number] Date of Birth: Mar 30, 1950

## 2017-06-24 ENCOUNTER — Encounter: Payer: Self-pay | Admitting: Family Medicine

## 2017-06-24 ENCOUNTER — Ambulatory Visit (INDEPENDENT_AMBULATORY_CARE_PROVIDER_SITE_OTHER): Payer: Medicare HMO | Admitting: Family Medicine

## 2017-06-24 VITALS — BP 144/76 | HR 70 | Resp 16 | Ht 68.0 in | Wt 270.6 lb

## 2017-06-24 DIAGNOSIS — E8881 Metabolic syndrome: Secondary | ICD-10-CM | POA: Diagnosis not present

## 2017-06-24 DIAGNOSIS — R718 Other abnormality of red blood cells: Secondary | ICD-10-CM | POA: Diagnosis not present

## 2017-06-24 DIAGNOSIS — Z114 Encounter for screening for human immunodeficiency virus [HIV]: Secondary | ICD-10-CM | POA: Diagnosis not present

## 2017-06-24 DIAGNOSIS — E2839 Other primary ovarian failure: Secondary | ICD-10-CM | POA: Diagnosis not present

## 2017-06-24 DIAGNOSIS — R69 Illness, unspecified: Secondary | ICD-10-CM | POA: Diagnosis not present

## 2017-06-24 DIAGNOSIS — I10 Essential (primary) hypertension: Secondary | ICD-10-CM | POA: Diagnosis not present

## 2017-06-24 DIAGNOSIS — F325 Major depressive disorder, single episode, in full remission: Secondary | ICD-10-CM

## 2017-06-24 DIAGNOSIS — I890 Lymphedema, not elsewhere classified: Secondary | ICD-10-CM | POA: Diagnosis not present

## 2017-06-24 DIAGNOSIS — E785 Hyperlipidemia, unspecified: Secondary | ICD-10-CM

## 2017-06-24 DIAGNOSIS — Z952 Presence of prosthetic heart valve: Secondary | ICD-10-CM

## 2017-06-24 DIAGNOSIS — Z79899 Other long term (current) drug therapy: Secondary | ICD-10-CM | POA: Diagnosis not present

## 2017-06-24 DIAGNOSIS — Z5320 Procedure and treatment not carried out because of patient's decision for unspecified reasons: Secondary | ICD-10-CM | POA: Diagnosis not present

## 2017-06-24 DIAGNOSIS — Z1159 Encounter for screening for other viral diseases: Secondary | ICD-10-CM

## 2017-06-24 MED ORDER — PRAVASTATIN SODIUM 20 MG PO TABS: 20.0000 mg | ORAL_TABLET | Freq: Every day | ORAL | 0 refills | Status: DC

## 2017-06-24 MED ORDER — HYDROCHLOROTHIAZIDE 12.5 MG PO TABS: 12.5000 mg | ORAL_TABLET | Freq: Every day | ORAL | 0 refills | Status: DC

## 2017-06-24 MED ORDER — METOPROLOL TARTRATE 25 MG PO TABS: 12.5000 mg | ORAL_TABLET | Freq: Two times a day (BID) | ORAL | 1 refills | Status: DC

## 2017-06-24 NOTE — Progress Notes (Signed)
Name: Melody Soto   MRN: [id number]    DOB: 10/21/1950   Date:06/24/2017       Progress Note  Subjective  Chief Complaint  Chief Complaint  Patient presents with  . Hypertension  . Depression  . Obesity    HPI  HTN: states bp at home has been in the 130's/80's, but elevated here, she agrees on going back on HCTZ. No chest pain or palpitation  Endometrial cancer: she was treated, and is still not working, has one follow up left with radiation oncologist, no vaginal bleeding  Obesity: she has lost 14 lbs since 04/2017, she has been counting calories past few weeks and states lymphedema therapy has also helped with weight loss.   Metabolic syndrome: we will recheck hgbA1C, denies polyphagia, polyuria or polydipsia.   S/p TAVR: she had surgery done at [facility] on 02/27/2017 for severe aortic stenosis. She states she was discharged on Lopressor 12.5 mg twice daily went up to 25 mg but bp is still elevated today.  She has occasional palpitation, but since before surgery . No symptoms of CHF such as orthopnea.   Depression: she took Celexa many years ago, she has a long history of depression ( worse when was separating from her husband), she still has crying spells and sadness that can last 3 days. She states Celexa made her too numb, she had a relapse when diagnosed with cancer 2018 but is feeling better, in remission.   Patient Active Problem List   Diagnosis Date Noted  . Endometrial cancer ([place]) 04/10/2017  . 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 Chronic acquired lymphedema 01/08/2017  . Obesity, morbid, BMI 40.0-49.9 ([place]) 11/13/2016  . FIGO stage II endometrial cancer ([place]) 10/29/2016  . Left ventricular hypertrophy 11/27/2014  . Lymphedema of lower extremity 08/23/2014  . Severe aortic stenosis 08/21/2014  . Benign essential HTN 08/21/2014  . Depression, major, recurrent, mild ([place]) 08/21/2014  . Dyslipidemia 08/21/2014  . Cardiac dysrhythmia  08/21/2014  . Dysmetabolic syndrome 79/89/2119  . Symptomatic menopausal or female climacteric states 08/21/2014  . 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 History of shingles 08/21/2014  . Personal history of skin cancer 11/24/2009    Past Surgical History:  Procedure Laterality Date  . 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 cataract surgery Bilateral 05/24/2014   second eye 06/07/2014  . MELANOMA EXCISION    . OVARIAN CYST REMOVAL     EXPLORATORY LAPAROTOMY  . TONSILLECTOMY      Family History  Problem Relation Age of Onset  . Cancer Mother   . Stroke Mother   . Thyroid nodules Mother   . Diabetes Sister   . Hypertension Sister   . Breast cancer Maternal Aunt   . Bone cancer Maternal Uncle     Social History   Socioeconomic History  . Marital status: Divorced    Spouse name: Not on file  . Number of children: Not on file  . Years of education: Not on file  . Highest education level: Not on file  Occupational History  . Not on file  Social Needs  . Financial resource strain: Not on file  . Food insecurity:    Worry: Not on file    Inability: Not on file  . Transportation needs:    Medical: Not on file    Non-medical: Not on file  Tobacco Use  . Smoking status: Never Smoker  . Smokeless tobacco: Never Used  Substance and Sexual Activity  .  Alcohol use: Yes    Alcohol/week: 0.0 oz    Comment: Minimal alcohol consumption WINE   . Drug use: No  . Sexual activity: Never    Birth control/protection: Post-menopausal  Lifestyle  . Physical activity:    Days per week: Not on file    Minutes per session: Not on file  . Stress: Not on file  Relationships  . Social connections:    Talks on phone: Not on file    Gets together: Not on file    Attends religious service: Not on file    Active member of club or organization: Not on file    Attends meetings of clubs or organizations: Not on file    Relationship status: Not on file  . Intimate  partner violence:    Fear of current or ex partner: Not on file    Emotionally abused: Not on file    Physically abused: Not on file    Forced sexual activity: Not on file  Other Topics Concern  . Not on file  Social History Narrative  . Not on file     Current Outpatient Medications:  .  aspirin 81 MG chewable tablet, Chew by mouth. PM, Disp: , Rfl:  .  Calcium Carb-Cholecalciferol (CALCIUM 500 + D3) 500-200 MG-UNIT TABS, Take 1 capsule by mouth 2 (two) times daily., Disp: , Rfl:  .  Coenzyme Q10 (COQ10) 50 MG CAPS, Take 1 tablet by mouth. , Disp: , Rfl:  .  magnesium oxide (MAG-OX) 400 MG tablet, Take 1 tablet by mouth 2 (two) times daily., Disp: , Rfl:  .  metoprolol tartrate (LOPRESSOR) 25 MG tablet, Take 0.5 tablets (12.5 mg total) by mouth 2 (two) times daily., Disp: 180 tablet, Rfl: 1 .  MULTIPLE VITAMIN PO, Take 2 tablets by mouth 2 (two) times daily. Reported on 05/19/2015, Disp: , Rfl:  .  pravastatin (PRAVACHOL) 20 MG tablet, Take 1 tablet (20 mg total) by mouth daily., Disp: 90 tablet, Rfl: 0 .  vitamin B-12 (CYANOCOBALAMIN) 1000 MCG tablet, Take 1,000 mcg by mouth daily. , Disp: , Rfl:  .  hydrochlorothiazide (HYDRODIURIL) 12.5 MG tablet, Take 1 tablet (12.5 mg total) by mouth daily., Disp: 90 tablet, Rfl: 0  Allergies  Allergen Reactions  . Ace Inhibitors Other (See Comments)    UNKNOWN REACTION  . Duloxetine     drowsiness     ROS  Constitutional: Negative for fever or weight change.  Respiratory: Negative for cough and shortness of breath.   Cardiovascular: Negative for chest pain or palpitations.  Gastrointestinal: Negative for abdominal pain, no bowel changes.  Musculoskeletal: Negative for gait problem or joint swelling.  Skin: Negative for rash.  Neurological: Negative for dizziness or headache.  No other specific complaints in a complete review of systems (except as listed in HPI above).  Objective  Vitals:   06/24/17 1035  BP: (!) 144/76  Pulse: 70   Resp: 16  SpO2: 94%  Weight: 270 lb 9.6 oz (122.7 kg)  Height: 5\' 8"  (1.727 m)    Body mass index is 41.14 kg/m.  Physical Exam  Constitutional: Patient appears well-developed and well-nourished. Obese  No distress.  HEENT: head atraumatic, normocephalic, pupils equal and reactive to light, neck supple, throat within normal limits Cardiovascular: Normal rate, regular rhythm and normal heart sounds.  2 plus  murmur heard, SEM . positive for BLE edema - lymphedema. Pulmonary/Chest: Effort normal and breath sounds normal. No respiratory distress. Abdominal: Soft.  There is no  tenderness. Psychiatric: Patient has a normal mood and affect. behavior is normal. Judgment and thought content normal.  PHQ2/9: Depression screen [facility] 2/9 06/24/2017 11/21/2016 02/24/2015 08/23/2014  Decreased Interest 0 0 0 0  Down, Depressed, Hopeless 0 0 0 0  PHQ - 2 Score 0 0 0 0  Altered sleeping 0 - - -  Tired, decreased energy 0 - - -  Change in appetite 0 - - -  Feeling bad or failure about yourself  0 - - -  Trouble concentrating 1 - - -  Moving slowly or fidgety/restless 0 - - -  Suicidal thoughts 0 - - -  PHQ-9 Score 1 - - -  Difficult doing work/chores Not difficult at all - - -      Fall Risk: Fall Risk  06/24/2017 03/21/2017 11/21/2016 02/24/2015 08/23/2014  Falls in the past year? No No No No No     Assessment & Plan  1. Major depressive disorder, in remission  [facility])  Doing well , not on medication   2. Dyslipidemia  - pravastatin (PRAVACHOL) 20 MG tablet; Take 1 tablet (20 mg total) by mouth daily.  Dispense: 90 tablet; Refill: 0 - Lipid panel  3. Benign essential HTN  - hydrochlorothiazide (HYDRODIURIL) 12.5 MG tablet; Take 1 tablet (12.5 mg total) by mouth daily.  Dispense: 90 tablet; Refill: 0 - metoprolol tartrate (LOPRESSOR) 25 MG tablet; Take 0.5 tablets (12.5 mg total) by mouth 2 (two) times daily.  Dispense: 180 tablet; Refill: 1 - COMPLETE METABOLIC PANEL WITH GFR - CBC  with Differential/Platelet  4. S/P TAVR (transcatheter aortic valve replacement)  No longer has SOB after surgery   5. Chronic acquired lymphedema  Going to OT and is wearing a post-op boot on the right and is doing well   6. Obesity, morbid, BMI 40.0-49.9 ([place])  Losing weight by counting calories  7. Dysmetabolic syndrome  - Hemoglobin A1c  8. Encounter for screening for HIV  -HIV   9. Need for hepatitis C screening test  - Hepatitis C Antibody  10. Ovarian failure  - DG Bone Density; Future  11. Abnormal red blood cells  - Vitamin B12

## 2017-06-25 ENCOUNTER — Ambulatory Visit: Payer: Medicare HMO | Admitting: Occupational Therapy

## 2017-06-25 DIAGNOSIS — I890 Lymphedema, not elsewhere classified: Secondary | ICD-10-CM | POA: Diagnosis not present

## 2017-06-25 LAB — CBC WITH DIFFERENTIAL/PLATELET
BASOS ABS: 63 {cells}/uL (ref 0–200)
BASOS PCT: 1.1 %
Eosinophils Absolute: 182 cells/uL (ref 15–500)
Eosinophils Relative: 3.2 %
HEMATOCRIT: 43.7 % (ref 35.0–45.0)
Hemoglobin: 14.7 g/dL (ref 11.7–15.5)
Lymphs Abs: 787 cells/uL — ABNORMAL LOW (ref 850–3900)
MCH: 32.3 pg (ref 27.0–33.0)
MCHC: 33.6 g/dL (ref 32.0–36.0)
MCV: 96 fL (ref 80.0–100.0)
MONOS PCT: 9.1 %
MPV: 11.4 fL (ref 7.5–12.5)
NEUTROS ABS: 4150 {cells}/uL (ref 1500–7800)
NEUTROS PCT: 72.8 %
Platelets: 192 10*3/uL (ref 140–400)
RBC: 4.55 10*6/uL (ref 3.80–5.10)
RDW: 11.8 % (ref 11.0–15.0)
TOTAL LYMPHOCYTE: 13.8 %
WBC mixed population: 519 cells/uL (ref 200–950)
WBC: 5.7 10*3/uL (ref 3.8–10.8)

## 2017-06-25 LAB — COMPLETE METABOLIC PANEL WITH GFR
AG Ratio: 1.7 (calc) (ref 1.0–2.5)
ALT: 32 U/L — ABNORMAL HIGH (ref 6–29)
AST: 26 U/L (ref 10–35)
Albumin: 4.2 g/dL (ref 3.6–5.1)
Alkaline phosphatase (APISO): 65 U/L (ref 33–130)
BUN: 22 mg/dL (ref 7–25)
CALCIUM: 10.2 mg/dL (ref 8.6–10.4)
CO2: 27 mmol/L (ref 20–32)
CREATININE: 0.73 mg/dL (ref 0.50–0.99)
Chloride: 106 mmol/L (ref 98–110)
GFR, EST NON AFRICAN AMERICAN: 86 mL/min/{1.73_m2} (ref >=60)
GFR, Est African American: 100 mL/min/{1.73_m2} (ref >=60)
GLOBULIN: 2.5 g/dL (ref 1.9–3.7)
GLUCOSE: 105 mg/dL (ref 65–139)
Potassium: 4.3 mmol/L (ref 3.5–5.3)
SODIUM: 140 mmol/L (ref 135–146)
Total Bilirubin: 0.3 mg/dL (ref 0.2–1.2)
Total Protein: 6.7 g/dL (ref 6.1–8.1)

## 2017-06-25 LAB — LIPID PANEL
CHOL/HDL RATIO: 3.9 (calc) (ref <5.0)
Cholesterol: 194 mg/dL (ref <200)
HDL: 50 mg/dL — AB (ref >50)
LDL CHOLESTEROL (CALC): 112 mg/dL — AB
Non-HDL Cholesterol (Calc): 144 mg/dL (calc) — ABNORMAL HIGH (ref <130)
TRIGLYCERIDES: 199 mg/dL — AB (ref <150)

## 2017-06-25 LAB — HEPATITIS C ANTIBODY
Hepatitis C Ab: NONREACTIVE
SIGNAL TO CUT-OFF: 0.01 (ref <1.00)

## 2017-06-25 LAB — VITAMIN B12: VITAMIN B 12: 733 pg/mL (ref 200–1100)

## 2017-06-25 LAB — HEMOGLOBIN A1C
HEMOGLOBIN A1C: 5.8 %{Hb} — AB (ref <5.7)
MEAN PLASMA GLUCOSE: 120 (calc)
eAG (mmol/L): 6.6 (calc)

## 2017-06-25 LAB — HIV ANTIBODY (ROUTINE TESTING W REFLEX): HIV 1&2 Ab, 4th Generation: NONREACTIVE

## 2017-06-25 NOTE — Therapy (Signed)
[place] MAIN [facility] SERVICES [address] [place], [place], [address] Phone: [phone]   Fax:  [phone]  Occupational Therapy Treatment  Patient Details  Name: Melody Soto MRN: [id number] Date of Birth: 07-Sep-1950 Referring Provider: Steele Sizer, MD   Encounter Date: 06/25/2017  OT End of Session - 06/25/17 1527    Visit Number  17    Number of Visits  36    Date for OT Re-Evaluation  07/30/17    OT Start Time  1112    OT Stop Time  1210    OT Time Calculation (min)  58 min    Activity Tolerance  Patient tolerated treatment well;No increased pain    Behavior During Therapy  WFL for tasks assessed/performed       Past Medical History:  Diagnosis Date  . Aortic stenosis   . Cataract   . Depression   . Endometrial cancer determined by uterine biopsy ([place]) 10/29/2016  . Heart murmur    HX OF  . Hyperlipidemia   . Hypertension    CONTROLLED ON MEDS  . Lymphedema   . Shortness of breath dyspnea     Past Surgical History:  Procedure Laterality Date  . 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 cataract surgery Bilateral 05/24/2014   second eye 06/07/2014  . MELANOMA EXCISION    . OVARIAN CYST REMOVAL     EXPLORATORY LAPAROTOMY  . TONSILLECTOMY      There were no vitals filed for this visit.  Subjective Assessment - 06/25/17 1521    Subjective   [name] presents for Occupational Therapy visit 16/36 to address BLE with CDT. Pt presents without wrps in place. Pt has no new complaints.    Pertinent History  BLE swelling w/ insideous onset in early 40's- no known precipitating event; no known family hx of limb swelling; Transcatheter aortic valve replacement 02/2017;  Hx depression; orbid obesity; HTN; FIGO stage II Endometrial cancer ([facility]); external XRT and brachytherapy complete; hx melanoma excision; urinary incontinence,     Limitations  chronic leg pain and swelling, R>L; limits ability to participate in  activities requiring standing and walking > 30 minutes, ; contributes to difficulty reaching feet to inspect skin, perform nail and sking care, and bathe feet and distal legs, LB bathing;,contributes. L Chronic leg swelling limits ability to dress and fit LB clothing and street shoes,. Chronic, progressive BLE lymphedema  contributes to impaired body image and depression.    Special Tests  Lymphedema Life Impact Scale (LLIS) score- baseline = 32, which translates to 47% iperceived impairement due to lymphedema over the past week    Patient Stated Goals  get the swelling and pain in my legs down so I can walk more and wear normal shoes    Currently in Pain?  No/denies    Pain Onset  More than a month ago > 20 years          LYMPHEDEMA/ONCOLOGY QUESTIONNAIRE - 06/25/17 1523      Right Lower Extremity Lymphedema   Other  RLE limb volume from ankle to below knee (A_D) = 5021.67 ml. RLE limb volume for thigh segement ( E-G) = 9701.42 ml. RLE Full leg ankle to groin volume (A-G) measures 14723.09 ml.    Other  RLE A-D limb volume is decreaaed by 4.28% since last measured on 4/29. and by 18.56 % overall since commencing CDT on 05/13/2017. RLE E-G segment is decreased by 3.8% since last measured on 2/29,  and by 12.74% overall. RLE full leg volume is decreased by 4% since 4/29, and by 14.82% overall. These values for each segment meet limb volume reduction goals at all segments and overall.              OT Treatments/Exercises (OP) - 06/25/17 0001      ADLs   ADL Education Given  Yes      Manual Therapy   Manual Therapy  Edema management;Compression Bandaging    Manual therapy comments  Comparative limb volumetrics    Compression Bandaging  LLE compression wraps applied from toes to groin today: Toe wrap omitted.             OT Education - 06/25/17 1527    Education provided  Yes    Education Details  Pt education for progress towards limb volume reduction goal.    Person(s)  Educated  Patient    Methods  Explanation;Demonstration    Comprehension  Verbalized understanding;Returned demonstration;Need further instruction          OT Long Term Goals - 06/10/17 1630      OT LONG TERM GOAL #1   Title  Pt modified independent w/ lymphedema precautions/prevention principals and using printed reference to limit LE progression and infection risk.    Baseline  Max A    Time  10    Period  Days    Status  Achieved      OT LONG TERM GOAL #2   Title  Lymphedema (LE) management/ self-care: Pt able to apply knee -length gradient compression wraps with extra time and printed reference PRN (modified independence) within 10 visits to achieve optimal limb volume reduction during Intensive Phase CDT.    Baseline  dependent    Time  10    Period  Days    Status  Achieved      OT LONG TERM GOAL #3   Title  Lymphedema (LE) management/ self-care:  Pt to achieve at least 10% limb volume reductions below knees bilaterally during Intensive Phase CDT to limit LE progression, to improve tissue integrity, to decrease infection risk and to improve functional arm and hand use essential for basic and instrumental ADLs performance.    Baseline  dependent  06/10/2017: Achieved for RLE leg  . Partially cachieved for LLE thigh and overall LVR    Time  12    Period  Weeks    Status  Partially Met      OT LONG TERM GOAL #4   Title  Lymphedema (LE) management/ self-care:  Pt to tolerate daily compression wraps, compression garments and/ or HOS devices in keeping w/ prescribed wear regime within 1 week of issue date of each to progress and retain clinical and functional gains and to limit LE progression.    Baseline  Max A    Time  12    Period  Weeks    Status  Partially Met      OT LONG TERM GOAL #5   Title  Lymphedema (LE) management/ self-care:  During Management Phase CDT Pt to sustain current limb volumes within 5%, and all other clinical gains achieved during OT treatment  independently (to control limb swelling and associated pain, to  limit LE progression, to decrease infection risk and to limit further functional decline.    Baseline  Max A    Time  6    Period  Months    Status  On-going  Plan - 06/25/17 1528    Clinical Impression Statement  Comparative limb volumetrics for RLE completed   today. Results reveal: RLE A-D limb volume is decreaaed by 4.28% since last measured on 4/29. and by 18.56 % overall since commencing CDT on 05/13/2017. RLE E-G segment is decreased by 3.8% since last measured on 2/29, and by 12.74% overall. RLE full leg volume is decreased by 4% since 4/29, and by 14.82% overall. These values for each segment meet limb volume reduction goals at all segments and overall.Cont as per POC.    Occupational performance deficits (Please refer to evaluation for details):  ADL's;IADL's;Work;Leisure;Social Participation;Other body image    Rehab Potential  Good    OT Frequency  3x / week    OT Duration  12 weeks    OT Treatment/Interventions  Self-care/ADL training;Therapeutic exercise;Manual Therapy;Manual lymph drainage;Therapeutic activities;DME and/or AE instruction;Compression bandaging;Other (comment);Patient/family education skin care with low ph lotion and / or castor oil to improve skin hydration and flexibility    Clinical Decision Making  Several treatment options, min-mod task modification necessary    Recommended Other Services  fit with knee length, custom, ccl 2-3, flat knit compression stockings, and knee length HOS devices to facilitate improved lymphatic function and decrease fibrosis formation during HOS    Consulted and Agree with Plan of Care  Patient       Patient will benefit from skilled therapeutic intervention in order to improve the following deficits and impairments:  Decreased skin integrity, Decreased knowledge of precautions, Decreased knowledge of use of DME, Impaired flexibility, Decreased balance,  Decreased mobility, Obesity, Decreased range of motion, Increased edema, Pain  Visit Diagnosis: Lymphedema, not elsewhere classified    Problem List Patient Active Problem List   Diagnosis Date Noted  . Endometrial cancer ([place]) 04/10/2017  . 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 Chronic acquired lymphedema 01/08/2017  . Obesity, morbid, BMI 40.0-49.9 ([place]) 11/13/2016  . FIGO stage II endometrial cancer ([place]) 10/29/2016  . Left ventricular hypertrophy 11/27/2014  . Lymphedema of lower extremity 08/23/2014  . Severe aortic stenosis 08/21/2014  . Benign essential HTN 08/21/2014  . Depression, major, recurrent, mild ([place]) 08/21/2014  . Dyslipidemia 08/21/2014  . Cardiac dysrhythmia 08/21/2014  . Dysmetabolic syndrome 37/34/2876  . Symptomatic menopausal or female climacteric states 08/21/2014  . 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 History of shingles 08/21/2014  . Personal history of skin cancer 11/24/2009    Andrey Spearman, MS, OTR/L, [facility] ([place] 06/25/17 3:29 PM  [place] MAIN [facility] SERVICES [address] [place], [place], [address] Phone: [phone]   Fax:  [phone]  Name: Melody Soto MRN: [id number] Date of Birth: 08-25-1950

## 2017-06-26 ENCOUNTER — Encounter: Payer: Medicare HMO | Admitting: Occupational Therapy

## 2017-06-27 ENCOUNTER — Ambulatory Visit: Payer: Medicare HMO | Admitting: Occupational Therapy

## 2017-06-27 DIAGNOSIS — I890 Lymphedema, not elsewhere classified: Secondary | ICD-10-CM | POA: Diagnosis not present

## 2017-06-27 NOTE — Therapy (Signed)
[place] MAIN [facility] SERVICES [address] [place], [place], [address] Phone: [phone]   Fax:  [phone]  Occupational Therapy Treatment  Patient Details  Name: Melody Soto MRN: [id number] Date of Birth: [birth year] Referring Provider: Steele Sizer, MD   Encounter Date: 06/27/2017  OT End of Session - 06/27/17 1431    Visit Number  18    Number of Visits  36    Date for OT Re-Evaluation  07/30/17    OT Start Time  1105    OT Stop Time  1210    OT Time Calculation (min)  65 min    Activity Tolerance  Patient tolerated treatment well;No increased pain    Behavior During Therapy  WFL for tasks assessed/performed       Past Medical History:  Diagnosis Date  . Aortic stenosis   . Cataract   . Depression   . Endometrial cancer determined by uterine biopsy ([place]) 10/29/2016  . Heart murmur    HX OF  . Hyperlipidemia   . Hypertension    CONTROLLED ON MEDS  . Lymphedema   . Shortness of breath dyspnea     Past Surgical History:  Procedure Laterality Date  . 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 cataract surgery Bilateral 05/24/2014   second eye 06/07/2014  . MELANOMA EXCISION    . OVARIAN CYST REMOVAL     EXPLORATORY LAPAROTOMY  . TONSILLECTOMY      There were no vitals filed for this visit.  Subjective Assessment - 06/27/17 1115    Subjective   Melody Soto presents for Occupational Therapy visit 17/36 to address BLE with CDT. Pt presents without wrps in place. Pt has no new complaints.     Pertinent History  BLE swelling w/ insideous onset in early 40's- no known precipitating event; no known family hx of limb swelling; Transcatheter aortic valve replacement 02/2017;  Hx depression; orbid obesity; HTN; FIGO stage II Endometrial cancer (HCC); external XRT and brachytherapy complete; hx melanoma excision; urinary incontinence,     Limitations  chronic leg pain and swelling, R>L; limits ability to participate in  activities requiring standing and walking > 30 minutes, ; contributes to difficulty reaching feet to inspect skin, perform nail and sking care, and bathe feet and distal legs, LB bathing;,contributes. L Chronic leg swelling limits ability to dress and fit LB clothing and street shoes,. Chronic, progressive BLE lymphedema  contributes to impaired body image and depression.    Special Tests  Lymphedema Life Impact Scale (LLIS) score- baseline = 32, which translates to 47% iperceived impairement due to lymphedema over the past week    Patient Stated Goals  get the swelling and pain in my legs down so I can walk more and wear normal shoes    Currently in Pain?  No/denies    Pain Onset  More than a month ago > 20 years                   OT Treatments/Exercises (OP) - 06/27/17 0001      ADLs   ADL Education Given  Yes      Manual Therapy   Manual Therapy  Edema management;Compression Bandaging;Manual Lymphatic Drainage (MLD)    Edema Management  n replaced existing, low rise post up shoe with high top shoes.    Manual Lymphatic Drainage (MLD)  MLD to LLE utilizing short neck sequence, deep abdominal breathing, functional healthy inguinal LN, and J strokes to leg  segments and foot.     Compression Bandaging  LLE compression wraps applied from toes to groin today: Toe wrap omitted.             OT Education - 06/27/17 1117    Education provided  Yes    Education Details  Pt edu re Medicare cap for OT and allowable visits. Pt edu re HOS device- rational and recommendations. Continued skilled Pt/caregiver education  And LE ADL training throughout visit for lymphedema self care/ home program, including compression wrapping, compression garment and device wear/care, lymphatic pumping ther ex, simple self-MLD, and skin care. Discussed progress towards goals.    Person(s) Educated  Patient    Methods  Explanation;Demonstration    Comprehension  Verbalized understanding;Returned  demonstration;Need further instruction          OT Long Term Goals - 06/10/17 1630      OT LONG TERM GOAL #1   Title  Pt modified independent w/ lymphedema precautions/prevention principals and using printed reference to limit LE progression and infection risk.    Baseline  Max A    Time  10    Period  Days    Status  Achieved      OT LONG TERM GOAL #2   Title  Lymphedema (LE) management/ self-care: Pt able to apply knee -length gradient compression wraps with extra time and printed reference PRN (modified independence) within 10 visits to achieve optimal limb volume reduction during Intensive Phase CDT.    Baseline  dependent    Time  10    Period  Days    Status  Achieved      OT LONG TERM GOAL #3   Title  Lymphedema (LE) management/ self-care:  Pt to achieve at least 10% limb volume reductions below knees bilaterally during Intensive Phase CDT to limit LE progression, to improve tissue integrity, to decrease infection risk and to improve functional arm and hand use essential for basic and instrumental ADLs performance.    Baseline  dependent  06/10/2017: Achieved for RLE leg  . Partially cachieved for LLE thigh and overall LVR    Time  12    Period  Weeks    Status  Partially Met      OT LONG TERM GOAL #4   Title  Lymphedema (LE) management/ self-care:  Pt to tolerate daily compression wraps, compression garments and/ or HOS devices in keeping w/ prescribed wear regime within 1 week of issue date of each to progress and retain clinical and functional gains and to limit LE progression.    Baseline  Max A    Time  12    Period  Weeks    Status  Partially Met      OT LONG TERM GOAL #5   Title  Lymphedema (LE) management/ self-care:  During Management Phase CDT Pt to sustain current limb volumes within 5%, and all other clinical gains achieved during OT treatment independently (to control limb swelling and associated pain, to  limit LE progression, to decrease infection risk and  to limit further functional decline.    Baseline  Max A    Time  6    Period  Months    Status  On-going            Plan - 06/27/17 1432    Clinical Impression Statement  Pt tolerated MLD and compression wrap to LLE without difficulty. Skin at lateral dorsal foot and ankle is mildly red  when foam chip  muff is removed, so muff not used today to limit skin irritation. Pt advised  to cont    using muff on alternating days as long as skin irritaion is not observed. Provided Pt edu for HOS device-rational and recommendations for [employer]AX to limit tissue density and to facilitate improved lymphatic circuulation during HOS. Compression wraps applied in clinic. Cont as per POC.    Occupational performance deficits (Please refer to evaluation for details):  ADL's;IADL's;Work;Leisure;Social Participation;Other body image    Rehab Potential  Good    OT Frequency  3x / week    OT Duration  12 weeks    OT Treatment/Interventions  Self-care/ADL training;Therapeutic exercise;Manual Therapy;Manual lymph drainage;Therapeutic activities;DME and/or AE instruction;Compression bandaging;Other (comment);Patient/family education skin care with low ph lotion and / or castor oil to improve skin hydration and flexibility    Clinical Decision Making  Several treatment options, min-mod task modification necessary    Recommended Other Services  fit with knee length, custom, ccl 2-3, flat knit compression stockings, and knee length HOS devices to facilitate improved lymphatic function and decrease fibrosis formation during HOS    Consulted and Agree with Plan of Care  Patient       Patient will benefit from skilled therapeutic intervention in order to improve the following deficits and impairments:  Decreased skin integrity, Decreased knowledge of precautions, Decreased knowledge of use of DME, Impaired flexibility, Decreased balance, Decreased mobility, Obesity, Decreased range of motion, Increased edema,  Pain  Visit Diagnosis: Lymphedema, not elsewhere classified    Problem List Patient Active Problem List   Diagnosis Date Noted  . Endometrial cancer ([place]) 04/10/2017  . 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 Chronic acquired lymphedema 01/08/2017  . Obesity, morbid, BMI 40.0-49.9 ([place]) 11/13/2016  . FIGO stage II endometrial cancer ([place]) 10/29/2016  . Left ventricular hypertrophy 11/27/2014  . Lymphedema of lower extremity 08/23/2014  . Severe aortic stenosis 08/21/2014  . Benign essential HTN 08/21/2014  . Depression, major, recurrent, mild ([place]) 08/21/2014  . Dyslipidemia 08/21/2014  . Cardiac dysrhythmia 08/21/2014  . Dysmetabolic syndrome 33/54/5625  . Symptomatic menopausal or female climacteric states 08/21/2014  . 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 History of shingles 08/21/2014  . Personal history of skin cancer 11/24/2009    Andrey Spearman, MS, OTR/L, [facility] 06/27/17 2:37 PM   [place] MAIN [facility] SERVICES [address] [place], [place], [address] Phone: [phone]   Fax:  [phone]  Name: Melody Soto MRN: [id number] Date of Birth: Nov 03, 1950

## 2017-07-01 ENCOUNTER — Ambulatory Visit: Payer: Medicare HMO | Admitting: Occupational Therapy

## 2017-07-02 ENCOUNTER — Encounter: Payer: Medicare HMO | Admitting: Occupational Therapy

## 2017-07-04 ENCOUNTER — Telehealth: Payer: Self-pay | Admitting: Radiation Oncology

## 2017-07-04 ENCOUNTER — Ambulatory Visit: Payer: Medicare HMO | Admitting: Occupational Therapy

## 2017-07-04 DIAGNOSIS — I890 Lymphedema, not elsewhere classified: Secondary | ICD-10-CM

## 2017-07-04 NOTE — Telephone Encounter (Signed)
Left VM @ 12:23 for Melody Soto that the PET scan has not been approved. Informed pt that we would call her to r\s scan once approved. Also left my number for return call with any questions.

## 2017-07-04 NOTE — Therapy (Signed)
[place] MAIN [facility] SERVICES [address] [place], [place], [address] Phone: [phone]   Fax:  [phone]  Occupational Therapy Treatment  Patient Details  Name: Melody Soto MRN: [id number] Date of Birth: April 06, 1950 Referring Provider: Steele Sizer, MD   Encounter Date: 07/04/2017  OT End of Session - 07/04/17 1402    Visit Number  19    Number of Visits  36    Date for OT Re-Evaluation  07/30/17    OT Start Time  1100    OT Stop Time  1215    OT Time Calculation (min)  75 min    Activity Tolerance  Patient tolerated treatment well;No increased pain    Behavior During Therapy  WFL for tasks assessed/performed       Past Medical History:  Diagnosis Date  . Aortic stenosis   . Cataract   . Depression   . Endometrial cancer determined by uterine biopsy ([place]) 10/29/2016  . Heart murmur    HX OF  . Hyperlipidemia   . Hypertension    CONTROLLED ON MEDS  . Lymphedema   . Shortness of breath dyspnea     Past Surgical History:  Procedure Laterality Date  . 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 cataract surgery Bilateral 05/24/2014   second eye 06/07/2014  . MELANOMA EXCISION    . OVARIAN CYST REMOVAL     EXPLORATORY LAPAROTOMY  . TONSILLECTOMY      There were no vitals filed for this visit.  Subjective Assessment - 07/04/17 1114    Subjective   Melody Soto presents for Occupational Therapy visit 19/36 to address BLE with CDT. Pt presents without wrps in place. Pt has no new complaints.     Pertinent History  BLE swelling w/ insideous onset in early 40's- no known precipitating event; no known family hx of limb swelling; Transcatheter aortic valve replacement 02/2017;  Hx depression; orbid obesity; HTN; FIGO stage II Endometrial cancer (HCC); external XRT and brachytherapy complete; hx melanoma excision; urinary incontinence,     Limitations  chronic leg pain and swelling, R>L; limits ability to participate in  activities requiring standing and walking > 30 minutes, ; contributes to difficulty reaching feet to inspect skin, perform nail and sking care, and bathe feet and distal legs, LB bathing;,contributes. L Chronic leg swelling limits ability to dress and fit LB clothing and street shoes,. Chronic, progressive BLE lymphedema  contributes to impaired body image and depression.    Special Tests  Lymphedema Life Impact Scale (LLIS) score- baseline = 32, which translates to 47% iperceived impairement due to lymphedema over the past week    Patient Stated Goals  get the swelling and pain in my legs down so I can walk more and wear normal shoes    Currently in Pain?  No/denies    Pain Onset  More than a month ago > 20 years                   OT Treatments/Exercises (OP) - 07/04/17 0001      ADLs   ADL Education Given  Yes      Manual Therapy   Manual Therapy  Edema management;Compression Bandaging;Manual Lymphatic Drainage (MLD)    Manual Lymphatic Drainage (MLD)  MLD to LLE utilizing short neck sequence, deep abdominal breathing, functional healthy inguinal LN, and J strokes to leg segments and foot.     Compression Bandaging  LLE compression wraps applied from toes to groin  today: Toe wrap omitted.                  OT Long Term Goals - 06/10/17 1630      OT LONG TERM GOAL #1   Title  Pt modified independent w/ lymphedema precautions/prevention principals and using printed reference to limit LE progression and infection risk.    Baseline  Max A    Time  10    Period  Days    Status  Achieved      OT LONG TERM GOAL #2   Title  Lymphedema (LE) management/ self-care: Pt able to apply knee -length gradient compression wraps with extra time and printed reference PRN (modified independence) within 10 visits to achieve optimal limb volume reduction during Intensive Phase CDT.    Baseline  dependent    Time  10    Period  Days    Status  Achieved      OT LONG TERM GOAL #3    Title  Lymphedema (LE) management/ self-care:  Pt to achieve at least 10% limb volume reductions below knees bilaterally during Intensive Phase CDT to limit LE progression, to improve tissue integrity, to decrease infection risk and to improve functional arm and hand use essential for basic and instrumental ADLs performance.    Baseline  dependent  06/10/2017: Achieved for RLE leg  . Partially cachieved for LLE thigh and overall LVR    Time  12    Period  Weeks    Status  Partially Met      OT LONG TERM GOAL #4   Title  Lymphedema (LE) management/ self-care:  Pt to tolerate daily compression wraps, compression garments and/ or HOS devices in keeping w/ prescribed wear regime within 1 week of issue date of each to progress and retain clinical and functional gains and to limit LE progression.    Baseline  Max A    Time  12    Period  Weeks    Status  Partially Met      OT LONG TERM GOAL #5   Title  Lymphedema (LE) management/ self-care:  During Management Phase CDT Pt to sustain current limb volumes within 5%, and all other clinical gains achieved during OT treatment independently (to control limb swelling and associated pain, to  limit LE progression, to decrease infection risk and to limit further functional decline.    Baseline  Max A    Time  6    Period  Months    Status  On-going            Plan - 07/04/17 1403    Clinical Impression Statement  Provided MLD and compression wrap to RLE. Pt reports decreased compliance with  compression wrap over visit interval. Cont as per OPC. Measurements next visit for limb volumes.    Occupational performance deficits (Please refer to evaluation for details):  ADL's;IADL's;Work;Leisure;Social Participation;Other body image    Rehab Potential  Good    OT Frequency  3x / week    OT Duration  12 weeks    OT Treatment/Interventions  Self-care/ADL training;Therapeutic exercise;Manual Therapy;Manual lymph drainage;Therapeutic activities;DME and/or AE  instruction;Compression bandaging;Other (comment);Patient/family education skin care with low ph lotion and / or castor oil to improve skin hydration and flexibility    Clinical Decision Making  Several treatment options, min-mod task modification necessary    Recommended Other Services  fit with knee length, custom, ccl 2-3, flat knit compression stockings, and knee length HOS devices to  facilitate improved lymphatic function and decrease fibrosis formation during HOS    Consulted and Agree with Plan of Care  Patient       Patient will benefit from skilled therapeutic intervention in order to improve the following deficits and impairments:  Decreased skin integrity, Decreased knowledge of precautions, Decreased knowledge of use of DME, Impaired flexibility, Decreased balance, Decreased mobility, Obesity, Decreased range of motion, Increased edema, Pain  Visit Diagnosis: Lymphedema, not elsewhere classified    Problem List Patient Active Problem List   Diagnosis Date Noted  . Endometrial cancer ([place]) 04/10/2017  . 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 Chronic acquired lymphedema 01/08/2017  . Obesity, morbid, BMI 40.0-49.9 ([place]) 11/13/2016  . FIGO stage II endometrial cancer ([place]) 10/29/2016  . Left ventricular hypertrophy 11/27/2014  . Lymphedema of lower extremity 08/23/2014  . Severe aortic stenosis 08/21/2014  . Benign essential HTN 08/21/2014  . Depression, major, recurrent, mild ([place]) 08/21/2014  . Dyslipidemia 08/21/2014  . Cardiac dysrhythmia 08/21/2014  . Dysmetabolic syndrome 86/77/3736  . Symptomatic menopausal or female climacteric states 08/21/2014  . 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 History of shingles 08/21/2014  . Personal history of skin cancer 11/24/2009    Andrey Spearman, MS, OTR/L, [facility] 07/04/17 2:05 PM  [place] MAIN [facility] SERVICES [address] [place], [place],  [address] Phone: [phone]   Fax:  [phone]  Name: Melody Soto MRN: [id number] Date of Birth: [birth year]

## 2017-07-05 ENCOUNTER — Ambulatory Visit: Payer: Medicare HMO

## 2017-07-08 ENCOUNTER — Ambulatory Visit: Payer: Medicare HMO

## 2017-07-09 ENCOUNTER — Encounter: Payer: Medicare HMO | Admitting: Occupational Therapy

## 2017-07-11 ENCOUNTER — Ambulatory Visit: Payer: Medicare HMO | Admitting: Occupational Therapy

## 2017-07-11 DIAGNOSIS — I890 Lymphedema, not elsewhere classified: Secondary | ICD-10-CM | POA: Diagnosis not present

## 2017-07-11 NOTE — Therapy (Signed)
[place] MAIN [facility] SERVICES [address] [place], [place], [address] Phone: [phone]   Fax:  [phone]  Occupational Therapy Treatment  Patient Details  Name: Melody Soto MRN: [id number] Date of Birth: 09-10-1950 Referring Provider: Steele Sizer, MD   Encounter Date: 07/11/2017  OT End of Session - 07/11/17 1438    Visit Number  20    Number of Visits  36    Date for OT Re-Evaluation  07/30/17    OT Start Time  1115    OT Stop Time  1215    OT Time Calculation (min)  60 min    Activity Tolerance  Patient tolerated treatment well;No increased pain    Behavior During Therapy  WFL for tasks assessed/performed       Past Medical History:  Diagnosis Date  . Aortic stenosis   . Cataract   . Depression   . Endometrial cancer determined by uterine biopsy ([place]) 10/29/2016  . Heart murmur    HX OF  . Hyperlipidemia   . Hypertension    CONTROLLED ON MEDS  . Lymphedema   . Shortness of breath dyspnea     Past Surgical History:  Procedure Laterality Date  . 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 cataract surgery Bilateral 05/24/2014   second eye 06/07/2014  . MELANOMA EXCISION    . OVARIAN CYST REMOVAL     EXPLORATORY LAPAROTOMY  . TONSILLECTOMY      There were no vitals filed for this visit.  Subjective Assessment - 07/11/17 1440    Subjective   Ms. Brandner presents for Occupational Therapy visit 20/36 to address BLE with CDT. Pt presents with compression wrps in place on RLE.  Pt reports medial aspect of R ankle feels much softer and less dense to the touch since starting OT for CDT.    Pertinent History  BLE swelling w/ insideous onset in early 40's- no known precipitating event; no known family hx of limb swelling; Transcatheter aortic valve replacement 02/2017;  Hx depression; orbid obesity; HTN; FIGO stage II Endometrial cancer (HCC); external XRT and brachytherapy complete; hx melanoma excision; urinary  incontinence,     Limitations  chronic leg pain and swelling, R>L; limits ability to participate in activities requiring standing and walking > 30 minutes, ; contributes to difficulty reaching feet to inspect skin, perform nail and sking care, and bathe feet and distal legs, LB bathing;,contributes. L Chronic leg swelling limits ability to dress and fit LB clothing and street shoes,. Chronic, progressive BLE lymphedema  contributes to impaired body image and depression.    Special Tests  Lymphedema Life Impact Scale (LLIS) score- baseline = 32, which translates to 47% iperceived impairement due to lymphedema over the past week    Patient Stated Goals  get the swelling and pain in my legs down so I can walk more and wear normal shoes    Currently in Pain?  No/denies    Pain Onset  More than a month ago > 20 years                   OT Treatments/Exercises (OP) - 07/11/17 0001      ADLs   ADL Education Given  Yes      Manual Therapy   Manual Therapy  Edema management;Compression Bandaging;Manual Lymphatic Drainage (MLD)    Manual therapy comments  skin care to LLE w/ low ph castor oil throughout MLD to improved      tissue  mobility and to decrease fatty fibrosis    Manual Lymphatic Drainage (MLD)  MLD to LLE utilizing short neck sequence, deep abdominal breathing, functional healthy inguinal LN, and J strokes to leg segments and foot.     Compression Bandaging  LLE compression wraps applied from toes to groin today: Toe wrap omitted.             OT Education - 07/11/17 1438    Education provided  Yes    Education Details  Continued skilled Pt/caregiver education  And LE ADL training throughout visit for lymphedema self care/ home program, including compression wrapping, compression garment and device wear/care, lymphatic pumping ther ex, simple self-MLD, and skin care. Discussed progress towards goals.     Person(s) Educated  Patient    Methods  Explanation;Demonstration     Comprehension  Verbalized understanding;Returned demonstration;Need further instruction          OT Long Term Goals - 07/11/17 1447      OT LONG TERM GOAL #1   Title  Pt modified independent w/ lymphedema precautions/prevention principals and using printed reference to limit LE progression and infection risk.    Baseline  Max A    Time  10    Period  Days    Status  Achieved      OT LONG TERM GOAL #2   Title  Lymphedema (LE) management/ self-care: Pt able to apply knee -length gradient compression wraps with extra time and printed reference PRN (modified independence) within 10 visits to achieve optimal limb volume reduction during Intensive Phase CDT.    Baseline  dependent    Time  10    Period  Days    Status  Achieved      OT LONG TERM GOAL #3   Title  Lymphedema (LE) management/ self-care:  Pt to achieve at least 10% limb volume reductions below knees bilaterally during Intensive Phase CDT to limit LE progression, to improve tissue integrity, to decrease infection risk and to improve functional arm and hand use essential for basic and instrumental ADLs performance.    Baseline  dependent  06/10/2017: Achieved for RLE leg  . Partially cachieved for LLE thigh and overall LVR    Time  12    Period  Weeks    Status  Partially Met      OT LONG TERM GOAL #4   Title  Lymphedema (LE) management/ self-care:  Pt to tolerate daily compression wraps, compression garments and/ or HOS devices in keeping w/ prescribed wear regime within 1 week of issue date of each to progress and retain clinical and functional gains and to limit LE progression.    Baseline  Max A    Time  12    Period  Weeks    Status  Achieved      OT LONG TERM GOAL #5   Title  Lymphedema (LE) management/ self-care:  During Management Phase CDT Pt to sustain current limb volumes within 5%, and all other clinical gains achieved during OT treatment independently (to control limb swelling and associated pain, to  limit LE  progression, to decrease infection risk and to limit further functional decline.    Baseline  Max A    Time  6    Period  Months    Status  On-going            Plan - 07/11/17 1439    Clinical Impression Statement  Provided MLD to LLE/ LLQ. Pt fully engaged in  Pt edu for LE self care throughout session. Compression wraps applied as established. Pt demonstrates ongoing slow, but steady progress towards limb volujme reduction goal, which is typical for response pattern of lipo lymphedema. Pt demonstrates improved compliance with compression wraps between  visits since replacing Rx shoe with high top. Pt pleased with progress tus far. See "LONG TERM GOALS" for additional details re progress. Cont as per POC.    Occupational performance deficits (Please refer to evaluation for details):  ADL's;IADL's;Work;Leisure;Social Participation;Other body image    Rehab Potential  Good    OT Frequency  3x / week    OT Duration  12 weeks    OT Treatment/Interventions  Self-care/ADL training;Therapeutic exercise;Manual Therapy;Manual lymph drainage;Therapeutic activities;DME and/or AE instruction;Compression bandaging;Other (comment);Patient/family education skin care with low ph lotion and / or castor oil to improve skin hydration and flexibility    Clinical Decision Making  Several treatment options, min-mod task modification necessary    Recommended Other Services  fit with knee length, custom, ccl 2-3, flat knit compression stockings, and knee length HOS devices to facilitate improved lymphatic function and decrease fibrosis formation during HOS    Consulted and Agree with Plan of Care  Patient       Patient will benefit from skilled therapeutic intervention in order to improve the following deficits and impairments:  Decreased skin integrity, Decreased knowledge of precautions, Decreased knowledge of use of DME, Impaired flexibility, Decreased balance, Decreased mobility, Obesity, Decreased range of  motion, Increased edema, Pain  Visit Diagnosis: Lymphedema, not elsewhere classified    Problem List Patient Active Problem List   Diagnosis Date Noted  . Endometrial cancer ([place]) 04/10/2017  . 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 Chronic acquired lymphedema 01/08/2017  . Obesity, morbid, BMI 40.0-49.9 ([place]) 11/13/2016  . FIGO stage II endometrial cancer ([place]) 10/29/2016  . Left ventricular hypertrophy 11/27/2014  . Lymphedema of lower extremity 08/23/2014  . Severe aortic stenosis 08/21/2014  . Benign essential HTN 08/21/2014  . Depression, major, recurrent, mild ([place]) 08/21/2014  . Dyslipidemia 08/21/2014  . Cardiac dysrhythmia 08/21/2014  . Dysmetabolic syndrome 78/24/2353  . Symptomatic menopausal or female climacteric states 08/21/2014  . 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 History of shingles 08/21/2014  . Personal history of skin cancer 11/24/2009    Andrey Spearman, MS, OTR/L, [facility] 07/11/17 2:48 PM   [place] MAIN [facility] SERVICES [address] [place], [place], [address] Phone: [phone]   Fax:  [phone]  Name: ALORIA LOOPER MRN: [id number] Date of Birth: September 23, 1950

## 2017-07-15 ENCOUNTER — Ambulatory Visit (INDEPENDENT_AMBULATORY_CARE_PROVIDER_SITE_OTHER): Payer: Medicare HMO | Admitting: Family Medicine

## 2017-07-15 ENCOUNTER — Ambulatory Visit: Payer: Medicare HMO | Attending: Family Medicine | Admitting: Occupational Therapy

## 2017-07-15 ENCOUNTER — Encounter: Payer: Self-pay | Admitting: Family Medicine

## 2017-07-15 VITALS — BP 128/60 | HR 85 | Temp 98.5°F | Resp 16 | Ht 68.5 in | Wt 267.3 lb

## 2017-07-15 DIAGNOSIS — N39498 Other specified urinary incontinence: Secondary | ICD-10-CM

## 2017-07-15 DIAGNOSIS — I890 Lymphedema, not elsewhere classified: Secondary | ICD-10-CM | POA: Insufficient documentation

## 2017-07-15 DIAGNOSIS — Z952 Presence of prosthetic heart valve: Secondary | ICD-10-CM

## 2017-07-15 DIAGNOSIS — I493 Ventricular premature depolarization: Secondary | ICD-10-CM | POA: Diagnosis not present

## 2017-07-15 DIAGNOSIS — D708 Other neutropenia: Secondary | ICD-10-CM | POA: Diagnosis not present

## 2017-07-15 DIAGNOSIS — Z0000 Encounter for general adult medical examination without abnormal findings: Secondary | ICD-10-CM

## 2017-07-15 NOTE — Patient Instructions (Signed)
Preventive Care 66 Years and Older, Female Preventive care refers to lifestyle choices and visits with your health care provider that can promote health and wellness. What does preventive care include?  A yearly physical exam. This is also called an annual well check.  Dental exams once or twice a year.  Routine eye exams. Ask your health care provider how often you should have your eyes checked.  Personal lifestyle choices, including: ? Daily care of your teeth and gums. ? Regular physical activity. ? Eating a healthy diet. ? Avoiding tobacco and drug use. ? Limiting alcohol use. ? Practicing safe sex. ? Taking low-dose aspirin every day. ? Taking vitamin and mineral supplements as recommended by your health care provider. What happens during an annual well check? The services and screenings done by your health care provider during your annual well check will depend on your age, overall health, lifestyle risk factors, and family history of disease. Counseling Your health care provider may ask you questions about your:  Alcohol use.  Tobacco use.  Drug use.  Emotional well-being.  Home and relationship well-being.  Sexual activity.  Eating habits.  History of falls.  Memory and ability to understand (cognition).  Work and work environment.  Reproductive health.  Screening You may have the following tests or measurements:  Height, weight, and BMI.  Blood pressure.  Lipid and cholesterol levels. These may be checked every 5 years, or more frequently if you are over 50 years old.  Skin check.  Lung cancer screening. You may have this screening every year starting at age 55 if you have a 30-pack-year history of smoking and currently smoke or have quit within the past 15 years.  Fecal occult blood test (FOBT) of the stool. You may have this test every year starting at age 50.  Flexible sigmoidoscopy or colonoscopy. You may have a sigmoidoscopy every 5 years or  a colonoscopy every 10 years starting at age 50.  Hepatitis C blood test.  Hepatitis B blood test.  Sexually transmitted disease (STD) testing.  Diabetes screening. This is done by checking your blood sugar (glucose) after you have not eaten for a while (fasting). You may have this done every 1-3 years.  Bone density scan. This is done to screen for osteoporosis. You may have this done starting at age 65.  Mammogram. This may be done every 1-2 years. Talk to your health care provider about how often you should have regular mammograms.  Talk with your health care provider about your test results, treatment options, and if necessary, the need for more tests. Vaccines Your health care provider may recommend certain vaccines, such as:  Influenza vaccine. This is recommended every year.  Tetanus, diphtheria, and acellular pertussis (Tdap, Td) vaccine. You may need a Td booster every 10 years.  Varicella vaccine. You may need this if you have not been vaccinated.  Zoster vaccine. You may need this after age 60.  Measles, mumps, and rubella (MMR) vaccine. You may need at least one dose of MMR if you were born in 1957 or later. You may also need a second dose.  Pneumococcal 13-valent conjugate (PCV13) vaccine. One dose is recommended after age 65.  Pneumococcal polysaccharide (PPSV23) vaccine. One dose is recommended after age 65.  Meningococcal vaccine. You may need this if you have certain conditions.  Hepatitis A vaccine. You may need this if you have certain conditions or if you travel or work in places where you may be exposed to hepatitis   A.  Hepatitis B vaccine. You may need this if you have certain conditions or if you travel or work in places where you may be exposed to hepatitis B.  Haemophilus influenzae type b (Hib) vaccine. You may need this if you have certain conditions.  Talk to your health care provider about which screenings and vaccines you need and how often you  need them. This information is not intended to replace advice given to you by your health care provider. Make sure you discuss any questions you have with your health care provider. Document Released: 02/25/2015 Document Revised: 10/19/2015 Document Reviewed: 11/30/2014 Elsevier Interactive Patient Education  2018 Elsevier Inc.  

## 2017-07-15 NOTE — Therapy (Signed)
[place] MAIN [facility] SERVICES [address] [place], [place], [address] Phone: [phone]   Fax:  [phone]  Occupational Therapy Treatment  Patient Details  Name: Melody Soto MRN: [id number] Date of Birth: 17-Oct-1950 Referring Provider: Steele Sizer, MD   Encounter Date: 07/15/2017  OT End of Session - 07/15/17 1500    Visit Number  21    Number of Visits  36    Date for OT Re-Evaluation  07/30/17    OT Start Time  1000    OT Stop Time  1110    OT Time Calculation (min)  70 min    Activity Tolerance  Patient tolerated treatment well;No increased pain    Behavior During Therapy  WFL for tasks assessed/performed       Past Medical History:  Diagnosis Date  . Aortic stenosis   . Cataract   . Depression   . Endometrial cancer determined by uterine biopsy ([place]) 10/29/2016  . Heart murmur    HX OF  . Hyperlipidemia   . Hypertension    CONTROLLED ON MEDS  . Lymphedema   . Shortness of breath dyspnea     Past Surgical History:  Procedure Laterality Date  . 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 cataract surgery Bilateral 05/24/2014   second eye 06/07/2014  . MELANOMA EXCISION    . OVARIAN CYST REMOVAL     EXPLORATORY LAPAROTOMY  . TONSILLECTOMY      There were no vitals filed for this visit.  Subjective Assessment - 07/15/17 1451    Subjective   Melody Soto presents for Occupational Therapy visit 21/36 to address BLE with CDT. Pt presents with compression wraps in place on RLE.  Pt reports she has been wrapped in compression for last 24 hrs.     Pertinent History  BLE swelling w/ insideous onset in early 40's- no known precipitating event; no known family hx of limb swelling; Transcatheter aortic valve replacement 02/2017;  Hx depression; orbid obesity; HTN; FIGO stage II Endometrial cancer (HCC); external XRT and brachytherapy complete; hx melanoma excision; urinary incontinence,     Limitations  chronic leg  pain and swelling, R>L; limits ability to participate in activities requiring standing and walking > 30 minutes, ; contributes to difficulty reaching feet to inspect skin, perform nail and sking care, and bathe feet and distal legs, LB bathing;,contributes. L Chronic leg swelling limits ability to dress and fit LB clothing and street shoes,. Chronic, progressive BLE lymphedema  contributes to impaired body image and depression.    Special Tests  Lymphedema Life Impact Scale (LLIS) score- baseline = 32, which translates to 47% iperceived impairement due to lymphedema over the past week    Patient Stated Goals  get the swelling and pain in my legs down so I can walk more and wear normal shoes    Currently in Pain?  No/denies    Pain Onset  More than a month ago > 20 years          LYMPHEDEMA/ONCOLOGY QUESTIONNAIRE - 07/15/17 1455      Right Lower Extremity Lymphedema   Other  RLE limb volume from ankle to below knee (A_D) = 6048.54 ml. RLE limb volume for thigh segement ( E-G) = 11035.76 ml. RLE Full leg ankle to groin volume (A-G) measures 14723.09 ml.    Other  RLE A-D limb volume is dramatically in creaaed by 20% % since last measured on 06/26/18. E-G volume is increased by 13.75%, and  overall  RLE limb volume is increased by 16.04% since last measured.               OT Treatments/Exercises (OP) - 07/15/17 0001      ADLs   ADL Education Given  Yes      Manual Therapy   Manual Therapy  Edema management    Manual therapy comments  RLE comparative limb volume    measurements.    Compression Bandaging  LLE compression wraps applied from toes to groin today: Toe wrap omitted.             OT Education - 07/15/17 1500    Education provided  Yes    Education Details  Continued skilled Pt/caregiver education  And LE ADL training throughout visit for lymphedema self care/ home program, including compression wrapping, compression garment and device wear/care, lymphatic pumping ther  ex, simple self-MLD, and skin care. Discussed progress towards goals.     Person(s) Educated  Patient    Methods  Explanation;Demonstration    Comprehension  Verbalized understanding;Returned demonstration;Need further instruction          OT Long Term Goals - 07/11/17 1447      OT LONG TERM GOAL #1   Title  Pt modified independent w/ lymphedema precautions/prevention principals and using printed reference to limit LE progression and infection risk.    Baseline  Max A    Time  10    Period  Days    Status  Achieved      OT LONG TERM GOAL #2   Title  Lymphedema (LE) management/ self-care: Pt able to apply knee -length gradient compression wraps with extra time and printed reference PRN (modified independence) within 10 visits to achieve optimal limb volume reduction during Intensive Phase CDT.    Baseline  dependent    Time  10    Period  Days    Status  Achieved      OT LONG TERM GOAL #3   Title  Lymphedema (LE) management/ self-care:  Pt to achieve at least 10% limb volume reductions below knees bilaterally during Intensive Phase CDT to limit LE progression, to improve tissue integrity, to decrease infection risk and to improve functional arm and hand use essential for basic and instrumental ADLs performance.    Baseline  dependent  06/10/2017: Achieved for RLE leg  . Partially cachieved for LLE thigh and overall LVR    Time  12    Period  Weeks    Status  Partially Met      OT LONG TERM GOAL #4   Title  Lymphedema (LE) management/ self-care:  Pt to tolerate daily compression wraps, compression garments and/ or HOS devices in keeping w/ prescribed wear regime within 1 week of issue date of each to progress and retain clinical and functional gains and to limit LE progression.    Baseline  Max A    Time  12    Period  Weeks    Status  Achieved      OT LONG TERM GOAL #5   Title  Lymphedema (LE) management/ self-care:  During Management Phase CDT Pt to sustain current limb  volumes within 5%, and all other clinical gains achieved during OT treatment independently (to control limb swelling and associated pain, to  limit LE progression, to decrease infection risk and to limit further functional decline.    Baseline  Max A    Time  6    Period  Months  Status  On-going            Plan - 07/15/17 1501    Clinical Impression Statement  Spent 30 min of 70 min OT session providing Pt edu reguaring  knowing when to complete compression garment measurements  and discussing recommended custom garments features and specifications.  RLE l comparative limb volumetrics reveal  dramatic limb volume increases below the knee, in thigh and full leg from anlke to groin. RLE A-D limb volume is dramatically in creaaed by 20% % since last measured on 06/26/18. E-G volume is increased by 13.75%, and overall  RLE limb volume is increased by 16.04% since last measured.  Pt is discouraged with these results. Pt in agreement with plan to remain 100% compliant with all LE self care components this week, then we'll repeat volumetric measurements at end of week. Goal at this stage of Intensive is to move forward with fitting RLE compression, but dramatic volumetric increases robserved today represent a significant set back and extension of CDT to RLE.    Occupational performance deficits (Please refer to evaluation for details):  ADL's;IADL's;Work;Leisure;Social Participation;Other body image    Rehab Potential  Good    OT Frequency  3x / week    OT Duration  12 weeks    OT Treatment/Interventions  Self-care/ADL training;Therapeutic exercise;Manual Therapy;Manual lymph drainage;Therapeutic activities;DME and/or AE instruction;Compression bandaging;Other (comment);Patient/family education skin care with low ph lotion and / or castor oil to improve skin hydration and flexibility    Clinical Decision Making  Several treatment options, min-mod task modification necessary    Recommended Other  Services  fit with knee length, custom, ccl 2-3, flat knit compression stockings, and knee length HOS devices to facilitate improved lymphatic function and decrease fibrosis formation during HOS    Consulted and Agree with Plan of Care  Patient       Patient will benefit from skilled therapeutic intervention in order to improve the following deficits and impairments:  Decreased skin integrity, Decreased knowledge of precautions, Decreased knowledge of use of DME, Impaired flexibility, Decreased balance, Decreased mobility, Obesity, Decreased range of motion, Increased edema, Pain  Visit Diagnosis: Lymphedema, not elsewhere classified    Problem List Patient Active Problem List   Diagnosis Date Noted  . Other neutropenia ([place]) 07/15/2017  . Endometrial cancer ([place]) 04/10/2017  . 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 Chronic acquired lymphedema 01/08/2017  . Obesity, morbid, BMI 40.0-49.9 ([place]) 11/13/2016  . FIGO stage II endometrial cancer ([place]) 10/29/2016  . Left ventricular hypertrophy 11/27/2014  . Lymphedema of lower extremity 08/23/2014  . Severe aortic stenosis 08/21/2014  . Benign essential HTN 08/21/2014  . Depression, major, recurrent, mild ([place]) 08/21/2014  . Dyslipidemia 08/21/2014  . Cardiac dysrhythmia 08/21/2014  . Dysmetabolic syndrome 39/76/7341  . Symptomatic menopausal or female climacteric states 08/21/2014  . 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 History of shingles 08/21/2014  . Personal history of skin cancer 11/24/2009    Andrey Spearman, MS, OTR/L, [facility] 07/15/17 3:08 PM  [place] MAIN [facility] SERVICES [address] [place], [place], [address] Phone: [phone]   Fax:  [phone]  Name: Melody Soto MRN: [id number] Date of Birth: Mar 28, 1950

## 2017-07-15 NOTE — Progress Notes (Signed)
Patient: Melody Soto, Female    DOB: [birth year], 67 y.o.   MRN: [id number]  Visit Date: 07/15/2017  Today's Provider: Loistine Chance, MD   Chief Complaint  Patient presents with  . Medicare Wellness    Subjective:    HPI Melody Soto is a 67 y.o. female who presents today for her Medicare Wellness  Patient/Caregiver input:  Urinary incontinence: had mild symptoms in the past, however since endometrial cancer radiation she noticed worsening of symptoms, usually voids when getting up from sitting position, mild urinary urgency, no significant stress incontinence. She denies dysuria.   Review of Systems  Constitutional: Negative for fever , positive for mild weight change.  Respiratory: Negative for cough and shortness of breath.   Cardiovascular: Negative for chest pain or palpitations.  Gastrointestinal: Negative for abdominal pain, no bowel changes. Bladder incontinence.  Musculoskeletal: Positive  for gait problem but no  joint swelling.  Skin: Negative for rash.  Neurological: Negative for dizziness or headache.  No other specific complaints in a complete review of systems (except as listed in HPI above).  Past Medical History:  Diagnosis Date  . Aortic stenosis   . Cataract   . Depression   . Endometrial cancer determined by uterine biopsy ([place]) 10/29/2016  . Heart murmur    HX OF  . Hyperlipidemia   . Hypertension    CONTROLLED ON MEDS  . Lymphedema   . Shortness of breath dyspnea     Past Surgical History:  Procedure Laterality Date  . 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 cataract surgery Bilateral 05/24/2014   second eye 06/07/2014  . MELANOMA EXCISION    . OVARIAN CYST REMOVAL     EXPLORATORY LAPAROTOMY  . TONSILLECTOMY      Family History  Problem Relation Age of Onset  . Cancer Mother   . Stroke Mother   . Thyroid nodules Mother   . Diabetes Sister   . Hypertension Sister   . Breast cancer Maternal Aunt   . Bone cancer  Maternal Uncle   . Depression Daughter   . Depression Daughter     Social History   Socioeconomic History  . Marital status: Divorced    Spouse name: Not on file  . Number of children: 2  . Years of education: Not on file  . Highest education level: Bachelor's degree (e.g., BA, AB, BS)  Occupational History  . Not on file  Social Needs  . Financial resource strain: Hard  . Food insecurity:    Worry: Never true    Inability: Never true  . Transportation needs:    Medical: No    Non-medical: No  Tobacco Use  . Smoking status: Never Smoker  . Smokeless tobacco: Never Used  Substance and Sexual Activity  . Alcohol use: Yes    Alcohol/week: 0.0 oz    Comment: Minimal alcohol consumption WINE   . Drug use: No  . Sexual activity: Not Currently    Birth control/protection: Post-menopausal  Lifestyle  . Physical activity:    Days per week: 0 days    Minutes per session: 0 min  . Stress: Not at all  Relationships  . Social connections:    Talks on phone: More than three times a week    Gets together: Twice a week    Attends religious service: More than 4 times per year    Active member of club or organization: Yes    Attends meetings of clubs or organizations: More  than 4 times per year    Relationship status: Divorced  . Intimate partner violence:    Fear of current or ex partner: No    Emotionally abused: No    Physically abused: No    Forced sexual activity: No  Other Topics Concern  . Not on file  Social History Narrative  . Not on file    Outpatient Encounter Medications as of 07/15/2017  Medication Sig Note  . aspirin 81 MG chewable tablet Chew by mouth. PM 08/23/2014: Received from: [facility]  . Calcium Carb-Cholecalciferol (CALCIUM 500 + D3) 500-200 MG-UNIT TABS Take 1 capsule by mouth 2 (two) times daily. 10/31/2016: Pt does not know the dosage, she is guessing  . Coenzyme Q10 (COQ10) 50 MG CAPS Take 1 tablet by mouth.  08/23/2014: Received  from: [facility]  . hydrochlorothiazide (HYDRODIURIL) 12.5 MG tablet Take 1 tablet (12.5 mg total) by mouth daily.   . magnesium oxide (MAG-OX) 400 MG tablet Take 1 tablet by mouth 2 (two) times daily.   . metoprolol tartrate (LOPRESSOR) 25 MG tablet Take 0.5 tablets (12.5 mg total) by mouth 2 (two) times daily.   . MULTIPLE VITAMIN PO Take 2 tablets by mouth 2 (two) times daily. Reported on 05/19/2015 08/23/2014: Received from: [facility]  . pravastatin (PRAVACHOL) 20 MG tablet Take 1 tablet (20 mg total) by mouth daily.   . vitamin B-12 (CYANOCOBALAMIN) 1000 MCG tablet Take 1,000 mcg by mouth daily.     No facility-administered encounter medications on file as of 07/15/2017.     Allergies  Allergen Reactions  . Ace Inhibitors Other (See Comments)    UNKNOWN REACTION  . Duloxetine     drowsiness    Care Team Updated in EHR: Yes  Last Vision Exam: not sure when the last exam was, but had cataract surgery in 2016  Wears corrective lenses: Yes Last Dental Exam: over two years Last Hearing Exam: Wears Hearing Aids: No  Functional Ability / Safety Screening 1.  Was the timed Get Up and Go test shorter than 30 seconds?  Yes  2.  Does the patient need help with the phone, transportation, shopping,      preparing meals, housework, laundry, medications, or managing money?  no 3.  Is the patient's home free of loose throw rugs in walkways, pet beds, electrical cords, etc?   no      Grab bars in the bathroom? no      Handrails on the stairs?   yes      Adequate lighting?   yes 4.  Has the patient noticed any hearing difficulties?   no  Diet Recall and Exercise Regimen:   Eating 1500 calories daily - portion control  Not physically active  Advanced Care Planning: A voluntary discussion about advance care planning including the explanation and discussion of advance directives.  Discussed health care proxy and Living will, and the patient was able to  identify a health care proxy as oldest daughter.  Patient does have a living will at present time, but copy is at [facility] only and will bring [place] forms Does patient have a HCPOA?    yes Does patient have a living will or MOST form?  yes  Venba Zenner: [phone]  Cancer Screenings:  Skin: discussed abnormal lesions  Lung:  Low Dose CT Chest recommended if Age 70-80 years, 30 pack-year currently smoking OR have quit w/in 15years. Patient does not qualify. Breast:  Up to date  on Mammogram? Yes  Up to date of Bone Density/Dexa? No Colon: Cologuard 2017 repeat in 2020  Additional Screenings:   Hepatitis B/HIV/Syphillis: N/A Hepatitis C Screening: 06/2017 Intimate Partner Violence: negative screening   Objective:   Vitals: BP 128/60 (BP Location: Left Arm, Patient Position: Sitting, Cuff Size: Normal)   Pulse 85   Temp 98.5 F (36.9 C) (Oral)   Resp 16   Ht 5' 8.5" (1.74 m)   Wt 267 lb 4.8 oz (121.2 kg)   SpO2 95%   BMI 40.05 kg/m  Body mass index is 40.05 kg/m.  No exam data present  Physical Exam Constitutional: Patient appears well-developed and well-nourished. Obese No distress.  HEENT: head atraumatic, normocephalic, pupils equal and reactive to light, neck supple, throat within normal limits Cardiovascular: Normal rate, regular rhythm and normal heart sounds.  No murmur heard. Positive for BLE edema - lymphedema. Pulmonary/Chest: Effort normal and breath sounds normal. No respiratory distress. Abdominal: Soft.  There is no tenderness. Psychiatric: Patient has a normal mood and affect. behavior is normal. Judgment and thought content normal.  Cognitive Testing - 6-CIT  Correct? Score   What year is it? yes 0 Yes = 0    No = 4  What month is it? yes 0 Yes = 0    No = 3  Remember:     Pia Mau, [place], [place]     What time is it? yes 0 Yes = 0    No = 3  Count backwards from 20 to 1 yes 0 Correct = 0    1 error = 2   More than 1 error = 4  Say the  months of the year in reverse. yes 0 Correct = 0    1 error = 2   More than 1 error = 4  What address did I ask you to remember? yes 0 Correct = 0  1 error = 2    2 error = 4    3 error = 6    4 error = 8    All wrong = 10       TOTAL SCORE  0/28   Interpretation:  Normal  Normal (0-7) Abnormal (8-28)   Fall Risk: Fall Risk  07/15/2017 06/24/2017 03/21/2017 11/21/2016 02/24/2015  Falls in the past year? No No No No No    Depression Screen Depression screen [facility] 2/9 07/15/2017 06/24/2017 11/21/2016 02/24/2015 08/23/2014  Decreased Interest 0 0 0 0 0  Down, Depressed, Hopeless 0 0 0 0 0  PHQ - 2 Score 0 0 0 0 0  Altered sleeping 0 0 - - -  Tired, decreased energy 0 0 - - -  Change in appetite 0 0 - - -  Feeling bad or failure about yourself  0 0 - - -  Trouble concentrating 1 1 - - -  Moving slowly or fidgety/restless 0 0 - - -  Suicidal thoughts 0 0 - - -  PHQ-9 Score 1 1 - - -  Difficult doing work/chores Not difficult at all Not difficult at all - - -    Recent Results (from the past 2160 hour(s))  COMPLETE METABOLIC PANEL WITH GFR     Status: Abnormal   Collection Time: 06/24/17 11:56 AM  Result Value Ref Range   Glucose, Bld 105 65 - 139 mg/dL    Comment: .        Non-fasting reference interval .    BUN 22 7 -  25 mg/dL   Creat 0.73 0.50 - 0.99 mg/dL    Comment: For patients >67 years of age, the reference limit for Creatinine is approximately 13% higher for people identified as African-American. .    GFR, Est Non African American 86 > OR = 60 mL/min/1.75m2   GFR, Est African American 100 > OR = 60 mL/min/1.44m2   BUN/Creatinine Ratio NOT APPLICABLE 6 - 22 (calc)   Sodium 140 135 - 146 mmol/L   Potassium 4.3 3.5 - 5.3 mmol/L   Chloride 106 98 - 110 mmol/L   CO2 27 20 - 32 mmol/L   Calcium 10.2 8.6 - 10.4 mg/dL   Total Protein 6.7 6.1 - 8.1 g/dL   Albumin 4.2 3.6 - 5.1 g/dL   Globulin 2.5 1.9 - 3.7 g/dL (calc)   AG Ratio 1.7 1.0 - 2.5 (calc)   Total Bilirubin 0.3 0.2 -  1.2 mg/dL   Alkaline phosphatase (APISO) 65 33 - 130 U/L   AST 26 10 - 35 U/L   ALT 32 (H) 6 - 29 U/L  Lipid panel     Status: Abnormal   Collection Time: 06/24/17 11:56 AM  Result Value Ref Range   Cholesterol 194 <200 mg/dL   HDL 50 (L) >50 mg/dL   Triglycerides 199 (H) <150 mg/dL   LDL Cholesterol (Calc) 112 (H) mg/dL (calc)    Comment: Reference range: <100 . Desirable range <100 mg/dL for primary prevention;   <70 mg/dL for patients with CHD or diabetic patients  with > or = 2 CHD risk factors. Marland Kitchen LDL-C is now calculated using the Martin-Hopkins  calculation, which is a validated novel method providing  better accuracy than the Friedewald equation in the  estimation of LDL-C.  Cresenciano Genre et al. Annamaria Helling. 7824;235(36): 2061-2068  (http://education.[facility].com/faq/FAQ164)    Total CHOL/HDL Ratio 3.9 <5.0 (calc)   Non-HDL Cholesterol (Calc) 144 (H) <130 mg/dL (calc)    Comment: For patients with diabetes plus 1 major ASCVD risk  factor, treating to a non-HDL-C goal of <100 mg/dL  (LDL-C of <70 mg/dL) is considered a therapeutic  option.   Hemoglobin A1c     Status: Abnormal   Collection Time: 06/24/17 11:56 AM  Result Value Ref Range   Hgb A1c MFr Bld 5.8 (H) <5.7 % of total Hgb    Comment: For someone without known diabetes, a hemoglobin  A1c value between 5.7% and 6.4% is consistent with prediabetes and should be confirmed with a  follow-up test. . For someone with known diabetes, a value <7% indicates that their diabetes is well controlled. A1c targets should be individualized based on duration of diabetes, age, comorbid conditions, and other considerations. . This assay result is consistent with an increased risk of diabetes. . Currently, no consensus exists regarding use of hemoglobin A1c for diagnosis of diabetes for children. .    Mean Plasma Glucose 120 (calc)   eAG (mmol/L) 6.6 (calc)  HIV antibody     Status: None   Collection Time: 06/24/17  11:56 AM  Result Value Ref Range   HIV 1&2 Ab, 4th Generation NON-REACTIVE NON-REACTI    Comment: HIV-1 antigen and HIV-1/HIV-2 antibodies were not detected. There is no laboratory evidence of HIV infection. Marland Kitchen PLEASE NOTE: This information has been disclosed to you from records whose confidentiality may be protected by state law.  If your state requires such protection, then the state law prohibits you from making any further disclosure of the information without the specific written consent of  the person to whom it pertains, or as otherwise permitted by law. A general authorization for the release of medical or other information is NOT sufficient for this purpose. . For additional information please refer to http://education.[facility].com/faq/FAQ106 (This link is being provided for informational/ educational purposes only.) . Marland Kitchen The performance of this assay has not been clinically validated in patients less than 27 years old. .   Vitamin B12     Status: None   Collection Time: 06/24/17 11:56 AM  Result Value Ref Range   Vitamin B-12 733 200 - 1,100 pg/mL  CBC with Differential/Platelet     Status: Abnormal   Collection Time: 06/24/17 11:56 AM  Result Value Ref Range   WBC 5.7 3.8 - 10.8 Thousand/uL   RBC 4.55 3.80 - 5.10 Million/uL   Hemoglobin 14.7 11.7 - 15.5 g/dL   HCT 43.7 35.0 - 45.0 %   MCV 96.0 80.0 - 100.0 fL   MCH 32.3 27.0 - 33.0 pg   MCHC 33.6 32.0 - 36.0 g/dL   RDW 11.8 11.0 - 15.0 %   Platelets 192 140 - 400 Thousand/uL   MPV 11.4 7.5 - 12.5 fL   Neutro Abs 4,150 1,500 - 7,800 cells/uL   Lymphs Abs 787 (L) 850 - 3,900 cells/uL   WBC mixed population 519 200 - 950 cells/uL   Eosinophils Absolute 182 15 - 500 cells/uL   Basophils Absolute 63 0 - 200 cells/uL   Neutrophils Relative % 72.8 %   Total Lymphocyte 13.8 %   Monocytes Relative 9.1 %   Eosinophils Relative 3.2 %   Basophils Relative 1.1 %  Hepatitis C Antibody     Status: None    Collection Time: 06/24/17 11:56 AM  Result Value Ref Range   Hepatitis C Ab NON-REACTIVE NON-REACTI   SIGNAL TO CUT-OFF 0.01 <1.00    Comment: . HCV antibody was non-reactive. There is no laboratory  evidence of HCV infection. . In most cases, no further action is required. However, if recent HCV exposure is suspected, a test for HCV RNA (test code [phone]) is suggested. . For additional information please refer to http://education.[facility].com/faq/FAQ22v1 (This link is being provided for informational/ educational purposes only.) .     Assessment & Plan:   1. Welcome to Medicare preventive visit  - EKG 12-Lead  2. S/P TAVR (transcatheter aortic valve replacement)   3. Frequent PVCs  - Ambulatory referral to Cardiology  4. Other urinary incontinence  She wants to hold off on going to PT or seeing urologist, likely post-voidal bleeding   5. Other neutropenia ([place])  We will recheck on her next lab drawn  Exercise Activities and Dietary recommendations  Goals:  She wants to lose weight - 10 lbs by August  - Discussed health benefits of physical activity, and encouraged her to engage in regular exercise appropriate for her age and condition.   Immunization History  Administered Date(s) Administered  . H1N1 02/25/2017  . Influenza,inj,Quad PF,6+ Mos 11/24/2015  . Influenza-Unspecified 11/13/2010  . Pneumococcal Polysaccharide-23 03/21/2017  . Tdap 07/14/2013  . Zoster 11/24/2015    Health Maintenance  Topic Date Due  . DEXA SCAN  11/10/2016  . INFLUENZA VACCINE  11/21/2017 (Originally 09/12/2017)  . PNA vac Low Risk Adult (2 of 2 - PCV13) 03/21/2018  . Fecal DNA (Cologuard)  11/25/2018  . MAMMOGRAM  04/17/2019  . PAP SMEAR  10/18/2019  . TETANUS/TDAP  07/15/2023  . Hepatitis C Screening  Completed  . HIV Screening  Completed  No orders of the defined types were placed in this encounter.   Current Outpatient Medications:  .  aspirin 81 MG  chewable tablet, Chew by mouth. PM, Disp: , Rfl:  .  Calcium Carb-Cholecalciferol (CALCIUM 500 + D3) 500-200 MG-UNIT TABS, Take 1 capsule by mouth 2 (two) times daily., Disp: , Rfl:  .  Coenzyme Q10 (COQ10) 50 MG CAPS, Take 1 tablet by mouth. , Disp: , Rfl:  .  hydrochlorothiazide (HYDRODIURIL) 12.5 MG tablet, Take 1 tablet (12.5 mg total) by mouth daily., Disp: 90 tablet, Rfl: 0 .  magnesium oxide (MAG-OX) 400 MG tablet, Take 1 tablet by mouth 2 (two) times daily., Disp: , Rfl:  .  metoprolol tartrate (LOPRESSOR) 25 MG tablet, Take 0.5 tablets (12.5 mg total) by mouth 2 (two) times daily., Disp: 180 tablet, Rfl: 1 .  MULTIPLE VITAMIN PO, Take 2 tablets by mouth 2 (two) times daily. Reported on 05/19/2015, Disp: , Rfl:  .  pravastatin (PRAVACHOL) 20 MG tablet, Take 1 tablet (20 mg total) by mouth daily., Disp: 90 tablet, Rfl: 0 .  vitamin B-12 (CYANOCOBALAMIN) 1000 MCG tablet, Take 1,000 mcg by mouth daily. , Disp: , Rfl:  There are no discontinued medications.  I have personally reviewed and addressed the Medicare Annual Wellness health risk assessment questionnaire and have noted the following in the patient's chart:  A.         Medical and social history & family history B.         Use of alcohol, tobacco, and illicit drugs  C.         Current medications and supplements D.         Functional and Cognitive ability and status E.         Nutritional status F.         Physical activity G.        Advance directives H.         List of other physicians I.          Hospitalizations, surgeries, and ER visits in previous 12 months J.         [place] such as hearing, vision, cognitive function, and depression L.         Referrals and appointments: due for bone density, we will also forward EKG to Dr. Clayborn Bigness her next visit with him is in October, and EKG is showing frequent PVC"s now  In addition, I have reviewed and discussed with patient certain preventive protocols, quality  metrics, and best practice recommendations. A written personalized care plan for preventive services as well as general preventive health recommendations were provided to patient.   See attached scanned questionnaire for additional information.

## 2017-07-16 ENCOUNTER — Ambulatory Visit: Payer: Medicare HMO | Admitting: Occupational Therapy

## 2017-07-16 DIAGNOSIS — I890 Lymphedema, not elsewhere classified: Secondary | ICD-10-CM | POA: Diagnosis not present

## 2017-07-16 NOTE — Therapy (Signed)
[place] MAIN [facility] SERVICES [address] [place], [place], [address] Phone: [phone]   Fax:  [phone]  Occupational Therapy Treatment  Patient Details  Name: Melody Soto MRN: [id number] Date of Birth: 1950/04/26 Referring Provider: Steele Sizer, MD   Encounter Date: 07/16/2017  OT End of Session - 07/16/17 1701    Visit Number  22    Number of Visits  36    Date for OT Re-Evaluation  07/30/17    OT Start Time  4742    OT Stop Time  1215    OT Time Calculation (min)  70 min    Activity Tolerance  Patient tolerated treatment well;No increased pain    Behavior During Therapy  WFL for tasks assessed/performed       Past Medical History:  Diagnosis Date  . Aortic stenosis   . Cataract   . Depression   . Endometrial cancer determined by uterine biopsy ([place]) 10/29/2016  . Heart murmur    HX OF  . Hyperlipidemia   . Hypertension    CONTROLLED ON MEDS  . Lymphedema   . Shortness of breath dyspnea     Past Surgical History:  Procedure Laterality Date  . 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 cataract surgery Bilateral 05/24/2014   second eye 06/07/2014  . MELANOMA EXCISION    . OVARIAN CYST REMOVAL     EXPLORATORY LAPAROTOMY  . TONSILLECTOMY      There were no vitals filed for this visit.  Subjective Assessment - 07/16/17 1110    Subjective   Melody Soto presents for Occupational Therapy visit 22/36 to address BLE with CDT. Pt presents with compression wraps in place on RLE.  Pt reports she has been wrapped in compression for last 24 hrs.     Pertinent History  BLE swelling w/ insideous onset in early 40's- no known precipitating event; no known family hx of limb swelling; Transcatheter aortic valve replacement 02/2017;  Hx depression; orbid obesity; HTN; FIGO stage II Endometrial cancer (HCC); external XRT and brachytherapy complete; hx melanoma excision; urinary incontinence,     Limitations  chronic leg  pain and swelling, R>L; limits ability to participate in activities requiring standing and walking > 30 minutes, ; contributes to difficulty reaching feet to inspect skin, perform nail and sking care, and bathe feet and distal legs, LB bathing;,contributes. L Chronic leg swelling limits ability to dress and fit LB clothing and street shoes,. Chronic, progressive BLE lymphedema  contributes to impaired body image and depression.    Special Tests  Lymphedema Life Impact Scale (LLIS) score- baseline = 32, which translates to 47% iperceived impairement due to lymphedema over the past week    Patient Stated Goals  get the swelling and pain in my legs down so I can walk more and wear normal shoes    Pain Onset  More than a month ago > 20 years          LYMPHEDEMA/ONCOLOGY QUESTIONNAIRE - 07/15/17 1455      Right Lower Extremity Lymphedema   Other  RLE limb volume from ankle to below knee (A_D) = 6048.54 ml. RLE limb volume for thigh segement ( E-G) = 11035.76 ml. RLE Full leg ankle to groin volume (A-G) measures 14723.09 ml.    Other  RLE A-D limb volume is dramatically in creaaed by 20% % since last measured on 06/26/18. E-G volume is increased by 13.75%, and overall  RLE limb volume is increased by  16.04% since last measured.               OT Treatments/Exercises (OP) - 07/16/17 0001      ADLs   ADL Education Given  Yes      Manual Therapy   Manual Therapy  Edema management    Edema Management  skin care    Manual Lymphatic Drainage (MLD)  MLD to LLE utilizing short neck sequence, deep abdominal breathing, functional healthy inguinal LN, and J strokes to leg segments and foot.     Compression Bandaging  LLE compression wraps applied from toes to groin today: Toe wrap omitted.             OT Education - 07/16/17 1700    Education provided  Yes    Education Details  Continued skilled Pt/caregiver education  And LE ADL training throughout visit for lymphedema self care/ home  program, including compression wrapping, compression garment and device wear/care, lymphatic pumping ther ex, simple self-MLD, and skin care. Discussed progress towards goals.     Person(s) Educated  Patient    Methods  Explanation;Demonstration    Comprehension  Verbalized understanding;Returned demonstration;Need further instruction          OT Long Term Goals - 07/11/17 1447      OT LONG TERM GOAL #1   Title  Pt modified independent w/ lymphedema precautions/prevention principals and using printed reference to limit LE progression and infection risk.    Baseline  Max A    Time  10    Period  Days    Status  Achieved      OT LONG TERM GOAL #2   Title  Lymphedema (LE) management/ self-care: Pt able to apply knee -length gradient compression wraps with extra time and printed reference PRN (modified independence) within 10 visits to achieve optimal limb volume reduction during Intensive Phase CDT.    Baseline  dependent    Time  10    Period  Days    Status  Achieved      OT LONG TERM GOAL #3   Title  Lymphedema (LE) management/ self-care:  Pt to achieve at least 10% limb volume reductions below knees bilaterally during Intensive Phase CDT to limit LE progression, to improve tissue integrity, to decrease infection risk and to improve functional arm and hand use essential for basic and instrumental ADLs performance.    Baseline  dependent  06/10/2017: Achieved for RLE leg  . Partially cachieved for LLE thigh and overall LVR    Time  12    Period  Weeks    Status  Partially Met      OT LONG TERM GOAL #4   Title  Lymphedema (LE) management/ self-care:  Pt to tolerate daily compression wraps, compression garments and/ or HOS devices in keeping w/ prescribed wear regime within 1 week of issue date of each to progress and retain clinical and functional gains and to limit LE progression.    Baseline  Max A    Time  12    Period  Weeks    Status  Achieved      OT LONG TERM GOAL #5    Title  Lymphedema (LE) management/ self-care:  During Management Phase CDT Pt to sustain current limb volumes within 5%, and all other clinical gains achieved during OT treatment independently (to control limb swelling and associated pain, to  limit LE progression, to decrease infection risk and to limit further functional decline.  Baseline  Max A    Time  6    Period  Months    Status  On-going            Plan - 07/16/17 1701    Clinical Impression Statement  Provided MLD and skin care to to RLE, including fibrosis techniques to medial and  lateral malleoli. Utilized custom fabricated chip pad on lateral maleolus in effort to continue to soften fatty   fibrosis. Pt tolerated all aspects of CDT            without difficulty today. Cont as per POC.    Occupational performance deficits (Please refer to evaluation for details):  ADL's;IADL's;Work;Leisure;Social Participation;Other body image    Rehab Potential  Good    OT Frequency  3x / week    OT Duration  12 weeks    OT Treatment/Interventions  Self-care/ADL training;Therapeutic exercise;Manual Therapy;Manual lymph drainage;Therapeutic activities;DME and/or AE instruction;Compression bandaging;Other (comment);Patient/family education skin care with low ph lotion and / or castor oil to improve skin hydration and flexibility    Clinical Decision Making  Several treatment options, min-mod task modification necessary    Recommended Other Services  fit with knee length, custom, ccl 2-3, flat knit compression stockings, and knee length HOS devices to facilitate improved lymphatic function and decrease fibrosis formation during HOS    Consulted and Agree with Plan of Care  Patient       Patient will benefit from skilled therapeutic intervention in order to improve the following deficits and impairments:  Decreased skin integrity, Decreased knowledge of precautions, Decreased knowledge of use of DME, Impaired flexibility, Decreased balance,  Decreased mobility, Obesity, Decreased range of motion, Increased edema, Pain  Visit Diagnosis: Lymphedema, not elsewhere classified    Problem List Patient Active Problem List   Diagnosis Date Noted  . Other neutropenia ([place]) 07/15/2017  . Endometrial cancer ([place]) 04/10/2017  . 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 Chronic acquired lymphedema 01/08/2017  . Obesity, morbid, BMI 40.0-49.9 ([place]) 11/13/2016  . FIGO stage II endometrial cancer ([place]) 10/29/2016  . Left ventricular hypertrophy 11/27/2014  . Lymphedema of lower extremity 08/23/2014  . Severe aortic stenosis 08/21/2014  . Benign essential HTN 08/21/2014  . Depression, major, recurrent, mild ([place]) 08/21/2014  . Dyslipidemia 08/21/2014  . Cardiac dysrhythmia 08/21/2014  . Dysmetabolic syndrome 87/21/5872  . Symptomatic menopausal or female climacteric states 08/21/2014  . 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 History of shingles 08/21/2014  . Personal history of skin cancer 11/24/2009    Andrey Spearman, MS, OTR/L, [facility] 07/16/17 5:06 PM  [place] MAIN [facility] SERVICES [address] [place], [place], [address] Phone: [phone]   Fax:  [phone]  Name: Melody Soto MRN: [id number] Date of Birth: 1950/04/28

## 2017-07-17 ENCOUNTER — Other Ambulatory Visit: Payer: Self-pay

## 2017-07-17 ENCOUNTER — Telehealth: Payer: Self-pay

## 2017-07-17 DIAGNOSIS — C541 Malignant neoplasm of endometrium: Secondary | ICD-10-CM

## 2017-07-17 NOTE — Telephone Encounter (Signed)
Spoke with Dr. Theora Gianotti regarding PET denial and need for appeal. PET discontinued and orders placed for CT AP with contrast. Oncology Nurse Navigator Documentation      )

## 2017-07-18 ENCOUNTER — Telehealth: Payer: Self-pay

## 2017-07-18 ENCOUNTER — Ambulatory Visit: Payer: Medicare HMO | Admitting: Occupational Therapy

## 2017-07-18 DIAGNOSIS — I890 Lymphedema, not elsewhere classified: Secondary | ICD-10-CM | POA: Diagnosis not present

## 2017-07-18 NOTE — Telephone Encounter (Signed)
Voicemail left on name verified mobile regarding the inability to get PET approved with insurance. CT AP ordered and we will start the insurance authorization and notify her with scan date/time. Instructed to call with any questions. Oncology Nurse Navigator Documentation  Navigator Location: [facility] (07/18/17 1100)   )Navigator Encounter Type: Telephone (07/18/17 1100) Telephone: Outgoing Call;Patient Update (07/18/17 1100)                                                  Time Spent with Patient: 15 (07/18/17 1100)

## 2017-07-18 NOTE — Therapy (Signed)
[place] MAIN [facility] SERVICES [address] [place], [place], [address] Phone: [phone]   Fax:  [phone]  Occupational Therapy Treatment  Patient Details  Name: Melody Soto MRN: [id number] Date of Birth: February 10, 1951 Referring Provider: Steele Sizer, MD   Encounter Date: 07/18/2017  OT End of Session - 07/18/17 1448    Visit Number  23    Number of Visits  36    Date for OT Re-Evaluation  07/30/17    OT Start Time  1117    Activity Tolerance  Patient tolerated treatment well;No increased pain    Behavior During Therapy  WFL for tasks assessed/performed       Past Medical History:  Diagnosis Date  . Aortic stenosis   . Cataract   . Depression   . Endometrial cancer determined by uterine biopsy ([place]) 10/29/2016  . Heart murmur    HX OF  . Hyperlipidemia   . Hypertension    CONTROLLED ON MEDS  . Lymphedema   . Shortness of breath dyspnea     Past Surgical History:  Procedure Laterality Date  . 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 cataract surgery Bilateral 05/24/2014   second eye 06/07/2014  . MELANOMA EXCISION    . OVARIAN CYST REMOVAL     EXPLORATORY LAPAROTOMY  . TONSILLECTOMY      There were no vitals filed for this visit.  Subjective Assessment - 07/18/17 1120    Subjective   Ms. Conery presents for Occupational Therapy visit 23/36 to address BLE with CDT. Pt presents with compression wraps in place on RLE.  Pt has no new complaints.    Pertinent History  BLE swelling w/ insideous onset in early 40's- no known precipitating event; no known family hx of limb swelling; Transcatheter aortic valve replacement 02/2017;  Hx depression; orbid obesity; HTN; FIGO stage II Endometrial cancer (HCC); external XRT and brachytherapy complete; hx melanoma excision; urinary incontinence,     Limitations  chronic leg pain and swelling, R>L; limits ability to participate in activities requiring standing and walking >  30 minutes, ; contributes to difficulty reaching feet to inspect skin, perform nail and sking care, and bathe feet and distal legs, LB bathing;,contributes. L Chronic leg swelling limits ability to dress and fit LB clothing and street shoes,. Chronic, progressive BLE lymphedema  contributes to impaired body image and depression.    Special Tests  Lymphedema Life Impact Scale (LLIS) score- baseline = 32, which translates to 47% iperceived impairement due to lymphedema over the past week    Patient Stated Goals  get the swelling and pain in my legs down so I can walk more and wear normal shoes    Currently in Pain?  No/denies    Pain Onset  More than a month ago > 20 years          LYMPHEDEMA/ONCOLOGY QUESTIONNAIRE - 07/18/17 1453      Right Lower Extremity Lymphedema   Other  RLE limb volume from ankle to below knee (A_D) = 5667.93 ml. RLE limb volume for thigh segement ( E-G) = 10395.14 ml. RLE Full leg ankle to groin volume (A-G) measures 16063.07 ml.    Other  RLE A-D limb volume is dcreaaed by 6.30% since last measured on 07/15/17. E-G volume is decreased by 5.8%, and overall  RLE limb volume is decreased by 5.98% since last measured.               OT Treatments/Exercises (OP) -  07/18/17 0001      ADLs   ADL Education Given  Yes      Manual Therapy   Manual Therapy  Edema management;Compression Bandaging    Manual therapy comments  RLE comparative limb volume    measurements.    Compression Bandaging  RLE knee length compressi9on wrap as established             OT Education - 07/18/17 1443    Education provided  Yes    Education Details  Pt education regarding differences in sequential pneumatic compression devices ("pumps" and those designed   primarily to treat     venous insufficience vs the Flexitouch, wgich is designed to trear lymphedema. Provided multiple handouts. Discussed pros and cons, indications and contra indications. Discussed availability of soon to be  donated Tactile Flexituoch and my consideration of her as candidate for using this device. Pt instructed to speak with her cardiologist as visit tomorrow to discuss wether or not this device may be useful to her for long term LE management, or if it is contraindicated    due to her recent aortic  valve replacement. Sent folder with info to MD.     Terence Lux) Educated  Patient    Methods  Explanation;Demonstration;Handout;Verbal cues;Tactile cues    Comprehension  Verbalized understanding;Returned demonstration;Need further instruction;Verbal cues required;Tactile cues required          OT Long Term Goals - 07/18/17 1122      OT LONG TERM GOAL #1   Title  Pt modified independent w/ lymphedema precautions/prevention principals and using printed reference to limit LE progression and infection risk.  (Pended)     Baseline  Max A  (Pended)     Time  10  (Pended)     Period  Days  (Pended)     Status  Achieved  (Pended)       OT LONG TERM GOAL #2   Title  Lymphedema (LE) management/ self-care: Pt able to apply knee -length gradient compression wraps with extra time and printed reference PRN (modified independence) within 10 visits to achieve optimal limb volume reduction during Intensive Phase CDT.  (Pended)     Baseline  dependent  (Pended)     Time  10  (Pended)     Period  Days  (Pended)     Status  Achieved  (Pended)       OT LONG TERM GOAL #3   Title  Lymphedema (LE) management/ self-care:  Pt to achieve at least 10% limb volume reductions below knees bilaterally during Intensive Phase CDT to limit LE progression, to improve tissue integrity, to decrease infection risk and to improve functional arm and hand use essential for basic and instrumental ADLs performance.  (Pended)     Baseline  dependent  06/10/2017: Achieved for RLE leg  . Partially cachieved for LLE thigh and overall LVR  (Pended)     Time  12  (Pended)     Period  Weeks  (Pended)     Status  Partially Met  (Pended)        OT LONG TERM GOAL #4   Title  Lymphedema (LE) management/ self-care:  Pt to tolerate daily compression wraps, compression garments and/ or HOS devices in keeping w/ prescribed wear regime within 1 week of issue date of each to progress and retain clinical and functional gains and to limit LE progression.  (Pended)     Baseline  Max A  (Pended)  Time  12  (Pended)     Period  Weeks  (Pended)     Status  Achieved  (Pended)       OT LONG TERM GOAL #5   Title  Lymphedema (LE) management/ self-care:  During Management Phase CDT Pt to sustain current limb volumes within 5%, and all other clinical gains achieved during  OT for CDT.OT treatment independently (to control limb swelling and associated pain, to  limit LE progression, to decrease infection risk and to limit further functional decline.  (Pended)     Baseline  Max A  (Pended)     Time  6  (Pended)     Period  Months  (Pended)     Status  On-going  (Pended)             Plan - 07/18/17 1500    Clinical Impression Statement  Emphasis of OT visit today on completing comparative RLE limb volumetrics, custom compression garment and device measurments, and Pt edu for garment options ands recommendations, progress towards goals, and edu  for sequential pneumatic compression devices, indications and contraindications. RLE A-D limb volume is dcreaaed by 6.30% since last measured on 07/15/17. E-G volume is decreased by 5.8%, and overall  RLE limb volume is decreased by 5.98% since last measured.  Provided handout for cardiology and encouraged Pt discuss the Tactile Flexitouch system as way of assisting with long term LE self-management to limit progression and improve tissue integrity. Pt understands a former patient's family wishes to donate this new device to a patient who may benefit. Cont as per POC.Fit with RLE custom garments  ASAP, then commence LLE CDT.    Occupational performance deficits (Please refer to evaluation for details):   ADL's;IADL's;Work;Leisure;Social Participation;Other body image    Rehab Potential  Good    OT Frequency  3x / week    OT Duration  12 weeks    OT Treatment/Interventions  Self-care/ADL training;Therapeutic exercise;Manual Therapy;Manual lymph drainage;Therapeutic activities;DME and/or AE instruction;Compression bandaging;Other (comment);Patient/family education skin care with low ph lotion and / or castor oil to improve skin hydration and flexibility    Clinical Decision Making  Several treatment options, min-mod task modification necessary    Recommended Other Services  fit with knee length, custom, ccl 2-3, flat knit compression stockings, and knee length HOS devices to facilitate improved lymphatic function and decrease fibrosis formation during HOS    Consulted and Agree with Plan of Care  Patient       Patient will benefit from skilled therapeutic intervention in order to improve the following deficits and impairments:  Decreased skin integrity, Decreased knowledge of precautions, Decreased knowledge of use of DME, Impaired flexibility, Decreased balance, Decreased mobility, Obesity, Decreased range of motion, Increased edema, Pain  Visit Diagnosis: Lymphedema, not elsewhere classified    Problem List Patient Active Problem List   Diagnosis Date Noted  . Other neutropenia ([place]) 07/15/2017  . Endometrial cancer ([place]) 04/10/2017  . 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 Chronic acquired lymphedema 01/08/2017  . Obesity, morbid, BMI 40.0-49.9 ([place]) 11/13/2016  . FIGO stage II endometrial cancer ([place]) 10/29/2016  . Left ventricular hypertrophy 11/27/2014  . Lymphedema of lower extremity 08/23/2014  . Severe aortic stenosis 08/21/2014  . Benign essential HTN 08/21/2014  . Depression, major, recurrent, mild ([place]) 08/21/2014  . Dyslipidemia 08/21/2014  . Cardiac dysrhythmia 08/21/2014  . Dysmetabolic syndrome 09/81/1914  . Symptomatic menopausal or female  climacteric states 08/21/2014  . Female genuine stress incontinence  08/21/2014  . History of shingles 08/21/2014  . Personal history of skin cancer 11/24/2009    Andrey Spearman, MS, OTR/L, [facility] 07/18/17 3:04 PM  [place] MAIN [facility] SERVICES [address] [place], [place], [address] Phone: [phone]   Fax:  [phone]  Name: Melody Soto MRN: [id number] Date of Birth: September 17, 1950

## 2017-07-19 DIAGNOSIS — I890 Lymphedema, not elsewhere classified: Secondary | ICD-10-CM | POA: Diagnosis not present

## 2017-07-19 DIAGNOSIS — R9431 Abnormal electrocardiogram [ECG] [EKG]: Secondary | ICD-10-CM | POA: Diagnosis not present

## 2017-07-19 DIAGNOSIS — E782 Mixed hyperlipidemia: Secondary | ICD-10-CM | POA: Diagnosis not present

## 2017-07-19 DIAGNOSIS — I10 Essential (primary) hypertension: Secondary | ICD-10-CM | POA: Diagnosis not present

## 2017-07-19 DIAGNOSIS — I350 Nonrheumatic aortic (valve) stenosis: Secondary | ICD-10-CM | POA: Diagnosis not present

## 2017-07-19 DIAGNOSIS — C541 Malignant neoplasm of endometrium: Secondary | ICD-10-CM | POA: Diagnosis not present

## 2017-07-19 DIAGNOSIS — I208 Other forms of angina pectoris: Secondary | ICD-10-CM | POA: Diagnosis not present

## 2017-07-19 DIAGNOSIS — Z952 Presence of prosthetic heart valve: Secondary | ICD-10-CM | POA: Diagnosis not present

## 2017-07-19 DIAGNOSIS — R0602 Shortness of breath: Secondary | ICD-10-CM | POA: Diagnosis not present

## 2017-07-22 ENCOUNTER — Ambulatory Visit: Payer: Medicare HMO | Admitting: Occupational Therapy

## 2017-07-22 DIAGNOSIS — I890 Lymphedema, not elsewhere classified: Secondary | ICD-10-CM

## 2017-07-22 NOTE — Therapy (Signed)
[place] MAIN [facility] SERVICES [address] [place], [place], [address] Phone: [phone]   Fax:  [phone]  Occupational Therapy Treatment  Patient Details  Name: Melody Soto MRN: [id number] Date of Birth: [birth year] Referring Provider: Steele Sizer, MD   Encounter Date: 07/22/2017  OT End of Session - 07/22/17 1611    Visit Number  24    Number of Visits  36    Date for OT Re-Evaluation  07/30/17    OT Start Time  1115    OT Stop Time  1215    OT Time Calculation (min)  60 min    Activity Tolerance  Patient tolerated treatment well;No increased pain    Behavior During Therapy  WFL for tasks assessed/performed       Past Medical History:  Diagnosis Date  . Aortic stenosis   . Cataract   . Depression   . Endometrial cancer determined by uterine biopsy ([place]) 10/29/2016  . Heart murmur    HX OF  . Hyperlipidemia   . Hypertension    CONTROLLED ON MEDS  . Lymphedema   . Shortness of breath dyspnea     Past Surgical History:  Procedure Laterality Date  . 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 cataract surgery Bilateral 05/24/2014   second eye 06/07/2014  . MELANOMA EXCISION    . OVARIAN CYST REMOVAL     EXPLORATORY LAPAROTOMY  . TONSILLECTOMY      There were no vitals filed for this visit.  Subjective Assessment - 07/22/17 1549    Subjective   Ms. Roan presents for Occupational Therapy visit 23/36 to address BLE with CDT. Pt presents with compression wraps in place on RLE.  Pt reports her Cardiologist [facility] Flexitouch sequential compression device  for LE self management. OT shared Pt's cell # with person donating the equipment. OT will train Pt on use of device.    Pertinent History  BLE swelling w/ insideous onset in early 40's- no known precipitating event; no known family hx of limb swelling; Transcatheter aortic valve replacement 02/2017;  Hx depression; orbid obesity; HTN; FIGO stage II Endometrial  cancer (HCC); external XRT and brachytherapy complete; hx melanoma excision; urinary incontinence,     Limitations  chronic leg pain and swelling, R>L; limits ability to participate in activities requiring standing and walking > 30 minutes, ; contributes to difficulty reaching feet to inspect skin, perform nail and sking care, and bathe feet and distal legs, LB bathing;,contributes. L Chronic leg swelling limits ability to dress and fit LB clothing and street shoes,. Chronic, progressive BLE lymphedema  contributes to impaired body image and depression.    Special Tests  Lymphedema Life Impact Scale (LLIS) score- baseline = 32, which translates to 47% iperceived impairement due to lymphedema over the past week    Patient Stated Goals  get the swelling and pain in my legs down so I can walk more and wear normal shoes    Currently in Pain?  No/denies    Pain Onset  More than a month ago > 20 years                   OT Treatments/Exercises (OP) - 07/22/17 0001      ADLs   ADL Education Given  Yes      Manual Therapy   Manual Therapy  Edema management    Manual Lymphatic Drainage (MLD)  MLD to LLE utilizing short neck sequence, deep abdominal breathing, functional  healthy inguinal LN, and J strokes to leg segments and foot.     Compression Bandaging  RLE knee length compressi9on wrap as established             OT Education - 07/22/17 1611    Education provided  Yes    Education Details  Pt education regarding differences in sequential pneumatic compression devices ("pumps" and those designed   primarily to treat     venous insufficience vs the Flexitouch, wgich is designed to trear lymphedema. Provided multiple handouts. Discussed pros and cons, indications and contra indications. Discussed availability of soon to be donated Tactile Flexituoch and my consideration of her as candidate for using this device. Pt instructed to speak with her cardiologist as visit tomorrow to discuss  wether or not this device may be useful to her for long term LE management, or if it is contraindicated    due to her recent aortic  valve replacement. Sent folder with info to MD.     Terence Lux) Educated  Patient    Methods  Explanation;Demonstration;Handout;Verbal cues;Tactile cues    Comprehension  Verbalized understanding;Returned demonstration;Need further instruction;Verbal cues required;Tactile cues required    Education Details  Continued skilled Pt/caregiver education  And LE ADL training throughout visit for lymphedema self care/ home program, including compression wrapping, compression garment and device wear/care, lymphatic pumping ther ex, simple self-MLD, and skin care. Discussed progress towards goals.     Person(s) Educated  Patient    Methods  Explanation;Demonstration    Comprehension  Verbalized understanding;Returned demonstration          OT Long Term Goals - 07/18/17 1122      OT LONG TERM GOAL #1   Title  Pt modified independent w/ lymphedema precautions/prevention principals and using printed reference to limit LE progression and infection risk.  (Pended)     Baseline  Max A  (Pended)     Time  10  (Pended)     Period  Days  (Pended)     Status  Achieved  (Pended)       OT LONG TERM GOAL #2   Title  Lymphedema (LE) management/ self-care: Pt able to apply knee -length gradient compression wraps with extra time and printed reference PRN (modified independence) within 10 visits to achieve optimal limb volume reduction during Intensive Phase CDT.  (Pended)     Baseline  dependent  (Pended)     Time  10  (Pended)     Period  Days  (Pended)     Status  Achieved  (Pended)       OT LONG TERM GOAL #3   Title  Lymphedema (LE) management/ self-care:  Pt to achieve at least 10% limb volume reductions below knees bilaterally during Intensive Phase CDT to limit LE progression, to improve tissue integrity, to decrease infection risk and to improve functional arm and hand use  essential for basic and instrumental ADLs performance.  (Pended)     Baseline  dependent  06/10/2017: Achieved for RLE leg  . Partially cachieved for LLE thigh and overall LVR  (Pended)     Time  12  (Pended)     Period  Weeks  (Pended)     Status  Partially Met  (Pended)       OT LONG TERM GOAL #4   Title  Lymphedema (LE) management/ self-care:  Pt to tolerate daily compression wraps, compression garments and/ or HOS devices in keeping w/ prescribed wear regime within 1 week  of issue date of each to progress and retain clinical and functional gains and to limit LE progression.  (Pended)     Baseline  Max A  (Pended)     Time  12  (Pended)     Period  Weeks  (Pended)     Status  Achieved  (Pended)       OT LONG TERM GOAL #5   Title  Lymphedema (LE) management/ self-care:  During Management Phase CDT Pt to sustain current limb volumes within 5%, and all other clinical gains achieved during  OT for CDT.OT treatment independently (to control limb swelling and associated pain, to  limit LE progression, to decrease infection risk and to limit further functional decline.  (Pended)     Baseline  Max A  (Pended)     Time  6  (Pended)     Period  Months  (Pended)     Status  On-going  (Pended)             Plan - 07/22/17 1613    Clinical Impression Statement  Pt demonstrates improved compliance with compression wrapping between visits, and limbvolume reduction and tissue softening is responsive. Provided LLE MLD and compression wrapping as established.. Discussed Pt's visit to cardiologist late last week and discussed his approval for sequential compression  device. Cont as per POC.    Occupational performance deficits (Please refer to evaluation for details):  ADL's;IADL's;Work;Leisure;Social Participation;Other body image    Rehab Potential  Good    OT Frequency  3x / week    OT Duration  12 weeks    OT Treatment/Interventions  Self-care/ADL training;Therapeutic exercise;Manual  Therapy;Manual lymph drainage;Therapeutic activities;DME and/or AE instruction;Compression bandaging;Other (comment);Patient/family education skin care with low ph lotion and / or castor oil to improve skin hydration and flexibility    Clinical Decision Making  Several treatment options, min-mod task modification necessary    Recommended Other Services  fit with knee length, custom, ccl 2-3, flat knit compression stockings, and knee length HOS devices to facilitate improved lymphatic function and decrease fibrosis formation during HOS    Consulted and Agree with Plan of Care  Patient       Patient will benefit from skilled therapeutic intervention in order to improve the following deficits and impairments:  Decreased skin integrity, Decreased knowledge of precautions, Decreased knowledge of use of DME, Impaired flexibility, Decreased balance, Decreased mobility, Obesity, Decreased range of motion, Increased edema, Pain  Visit Diagnosis: Lymphedema, not elsewhere classified    Problem List Patient Active Problem List   Diagnosis Date Noted  . Other neutropenia ([place]) 07/15/2017  . Endometrial cancer ([place]) 04/10/2017  . 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 Chronic acquired lymphedema 01/08/2017  . Obesity, morbid, BMI 40.0-49.9 ([place]) 11/13/2016  . FIGO stage II endometrial cancer ([place]) 10/29/2016  . Left ventricular hypertrophy 11/27/2014  . Lymphedema of lower extremity 08/23/2014  . Severe aortic stenosis 08/21/2014  . Benign essential HTN 08/21/2014  . Depression, major, recurrent, mild ([place]) 08/21/2014  . Dyslipidemia 08/21/2014  . Cardiac dysrhythmia 08/21/2014  . Dysmetabolic syndrome 33/83/2919  . Symptomatic menopausal or female climacteric states 08/21/2014  . 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 History of shingles 08/21/2014  . Personal history of skin cancer 11/24/2009    Andrey Spearman, MS, OTR/L, [facility] 07/22/17 4:16  PM  [place] MAIN [facility] SERVICES [address] [place], [place], [address] Phone: [phone]   Fax:  [phone]  Name:  Melody Soto MRN: [id number] Date of Birth: November 08, 1950

## 2017-07-23 ENCOUNTER — Ambulatory Visit: Payer: Medicare HMO | Admitting: Occupational Therapy

## 2017-07-23 ENCOUNTER — Ambulatory Visit: Payer: Medicare HMO

## 2017-07-23 DIAGNOSIS — I890 Lymphedema, not elsewhere classified: Secondary | ICD-10-CM

## 2017-07-23 NOTE — Therapy (Signed)
[place] MAIN [facility] SERVICES [address] [place], [place], [address] Phone: [phone]   Fax:  [phone]  Occupational Therapy Treatment  Patient Details  Name: Melody Soto MRN: [id number] Date of Birth: 04/10/1950 Referring Provider: Steele Sizer, MD   Encounter Date: 07/23/2017  OT End of Session - 07/23/17 1329    Visit Number  25    Number of Visits  36    Date for OT Re-Evaluation  07/30/17    OT Start Time  1120    OT Stop Time  1200    OT Time Calculation (min)  40 min    Activity Tolerance  Patient tolerated treatment well;No increased pain    Behavior During Therapy  WFL for tasks assessed/performed       Past Medical History:  Diagnosis Date  . Aortic stenosis   . Cataract   . Depression   . Endometrial cancer determined by uterine biopsy ([place]) 10/29/2016  . Heart murmur    HX OF  . Hyperlipidemia   . Hypertension    CONTROLLED ON MEDS  . Lymphedema   . Shortness of breath dyspnea     Past Surgical History:  Procedure Laterality Date  . 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 cataract surgery Bilateral 05/24/2014   second eye 06/07/2014  . MELANOMA EXCISION    . OVARIAN CYST REMOVAL     EXPLORATORY LAPAROTOMY  . TONSILLECTOMY      There were no vitals filed for this visit.  Subjective Assessment - 07/23/17 1327    Subjective   Ms. Debroux presents for Occupational Therapy visit 24/36 to address BLE with CDT. Pt presents with compression wraps in place on RLE.  pT IS 20 MINUTES LATE FOR A 60 MINUTE APPOINTMENT.    Pertinent History  BLE swelling w/ insideous onset in early 40's- no known precipitating event; no known family hx of limb swelling; Transcatheter aortic valve replacement 02/2017;  Hx depression; orbid obesity; HTN; FIGO stage II Endometrial cancer (HCC); external XRT and brachytherapy complete; hx melanoma excision; urinary incontinence,     Limitations  chronic leg pain and  swelling, R>L; limits ability to participate in activities requiring standing and walking > 30 minutes, ; contributes to difficulty reaching feet to inspect skin, perform nail and sking care, and bathe feet and distal legs, LB bathing;,contributes. L Chronic leg swelling limits ability to dress and fit LB clothing and street shoes,. Chronic, progressive BLE lymphedema  contributes to impaired body image and depression.    Special Tests  Lymphedema Life Impact Scale (LLIS) score- baseline = 32, which translates to 47% iperceived impairement due to lymphedema over the past week    Patient Stated Goals  get the swelling and pain in my legs down so I can walk more and wear normal shoes    Currently in Pain?  No/denies    Pain Onset  More than a month ago > 20 years                   OT Treatments/Exercises (OP) - 07/23/17 0001      ADLs   ADL Education Given  Yes      Manual Therapy   Manual Therapy  Edema management    Edema Management  skin care    Manual Lymphatic Drainage (MLD)  MLD to rLE utilizing short neck sequence, deep abdominal breathing, functional healthy inguinal LN, and J strokes to leg segments and foot.  Compression Bandaging  RLE knee length compressi9on wrap as established             OT Education - 07/23/17 1329    Education provided  Yes    Education Details  cONT pT EDU FOR le SELF CARE THRUGHOUT SESSION    Person(s) Educated  Patient    Methods  Explanation;Demonstration;Handout;Verbal cues;Tactile cues    Comprehension  Verbalized understanding;Returned demonstration;Need further instruction;Verbal cues required;Tactile cues required          OT Long Term Goals - 07/18/17 1122      OT LONG TERM GOAL #1   Title  Pt modified independent w/ lymphedema precautions/prevention principals and using printed reference to limit LE progression and infection risk.  (Pended)     Baseline  Max A  (Pended)     Time  10  (Pended)     Period  Days   (Pended)     Status  Achieved  (Pended)       OT LONG TERM GOAL #2   Title  Lymphedema (LE) management/ self-care: Pt able to apply knee -length gradient compression wraps with extra time and printed reference PRN (modified independence) within 10 visits to achieve optimal limb volume reduction during Intensive Phase CDT.  (Pended)     Baseline  dependent  (Pended)     Time  10  (Pended)     Period  Days  (Pended)     Status  Achieved  (Pended)       OT LONG TERM GOAL #3   Title  Lymphedema (LE) management/ self-care:  Pt to achieve at least 10% limb volume reductions below knees bilaterally during Intensive Phase CDT to limit LE progression, to improve tissue integrity, to decrease infection risk and to improve functional arm and hand use essential for basic and instrumental ADLs performance.  (Pended)     Baseline  dependent  06/10/2017: Achieved for RLE leg  . Partially cachieved for LLE thigh and overall LVR  (Pended)     Time  12  (Pended)     Period  Weeks  (Pended)     Status  Partially Met  (Pended)       OT LONG TERM GOAL #4   Title  Lymphedema (LE) management/ self-care:  Pt to tolerate daily compression wraps, compression garments and/ or HOS devices in keeping w/ prescribed wear regime within 1 week of issue date of each to progress and retain clinical and functional gains and to limit LE progression.  (Pended)     Baseline  Max A  (Pended)     Time  12  (Pended)     Period  Weeks  (Pended)     Status  Achieved  (Pended)       OT LONG TERM GOAL #5   Title  Lymphedema (LE) management/ self-care:  During Management Phase CDT Pt to sustain current limb volumes within 5%, and all other clinical gains achieved during  OT for CDT.OT treatment independently (to control limb swelling and associated pain, to  limit LE progression, to decrease infection risk and to limit further functional decline.  (Pended)     Baseline  Max A  (Pended)     Time  6  (Pended)     Period  Months  (Pended)      Status  On-going  (Pended)             Plan - 07/23/17 1330    Clinical Impression Statement  pROVIDED rle/rlq mld AS ESTABLISHED. Compression wraps applied from     foot to popliteal fossa. Cont as per POC. Shift to LLE CDT as soon as RLE garment is fitted.    Occupational performance deficits (Please refer to evaluation for details):  ADL's;IADL's;Work;Leisure;Social Participation;Other body image    Rehab Potential  Good    OT Frequency  3x / week    OT Duration  12 weeks    OT Treatment/Interventions  Self-care/ADL training;Therapeutic exercise;Manual Therapy;Manual lymph drainage;Therapeutic activities;DME and/or AE instruction;Compression bandaging;Other (comment);Patient/family education skin care with low ph lotion and / or castor oil to improve skin hydration and flexibility    Clinical Decision Making  Several treatment options, min-mod task modification necessary    Recommended Other Services  fit with knee length, custom, ccl 2-3, flat knit compression stockings, and knee length HOS devices to facilitate improved lymphatic function and decrease fibrosis formation during HOS    Consulted and Agree with Plan of Care  Patient       Patient will benefit from skilled therapeutic intervention in order to improve the following deficits and impairments:  Decreased skin integrity, Decreased knowledge of precautions, Decreased knowledge of use of DME, Impaired flexibility, Decreased balance, Decreased mobility, Obesity, Decreased range of motion, Increased edema, Pain  Visit Diagnosis: Lymphedema, not elsewhere classified    Problem List Patient Active Problem List   Diagnosis Date Noted  . Other neutropenia ([place]) 07/15/2017  . Endometrial cancer ([place]) 04/10/2017  . 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 Chronic acquired lymphedema 01/08/2017  . Obesity, morbid, BMI 40.0-49.9 ([place]) 11/13/2016  . FIGO stage II endometrial cancer ([place]) 10/29/2016  .  Left ventricular hypertrophy 11/27/2014  . Lymphedema of lower extremity 08/23/2014  . Severe aortic stenosis 08/21/2014  . Benign essential HTN 08/21/2014  . Depression, major, recurrent, mild ([place]) 08/21/2014  . Dyslipidemia 08/21/2014  . Cardiac dysrhythmia 08/21/2014  . Dysmetabolic syndrome 82/09/1386  . Symptomatic menopausal or female climacteric states 08/21/2014  . 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 History of shingles 08/21/2014  . Personal history of skin cancer 11/24/2009    Andrey Spearman, MS, OTR/L, [facility] 07/23/17 1:33 PM  [place] MAIN [facility] SERVICES [address] [place], [place], [address] Phone: [phone]   Fax:  [phone]  Name: MAEBELL LYVERS MRN: [id number] Date of Birth: [birth year]

## 2017-07-24 ENCOUNTER — Ambulatory Visit: Payer: Medicare HMO | Admitting: Radiation Oncology

## 2017-07-25 ENCOUNTER — Ambulatory Visit: Payer: Medicare HMO | Admitting: Occupational Therapy

## 2017-07-25 DIAGNOSIS — I890 Lymphedema, not elsewhere classified: Secondary | ICD-10-CM

## 2017-07-25 NOTE — Therapy (Addendum)
[place] MAIN [facility] SERVICES [address] [place], [place], [address] Phone: [phone]   Fax:  [phone]  Occupational Therapy Treatment  Patient Details  Name: Melody Soto MRN: [id number] Date of Birth: 04-Jun-1950 Referring Provider: Steele Sizer, MD   Encounter Date: 07/25/2017  OT End of Session - 07/25/17 1444    Visit Number  26    Number of Visits  36    Date for OT Re-Evaluation  07/30/17    OT Start Time  1059    OT Stop Time  1215    OT Time Calculation (min)  76 min    Activity Tolerance  Patient tolerated treatment well;No increased pain    Behavior During Therapy  WFL for tasks assessed/performed       Past Medical History:  Diagnosis Date  . Aortic stenosis   . Cataract   . Depression   . Endometrial cancer determined by uterine biopsy ([place]) 10/29/2016  . Heart murmur    HX OF  . Hyperlipidemia   . Hypertension    CONTROLLED ON MEDS  . Lymphedema   . Shortness of breath dyspnea     Past Surgical History:  Procedure Laterality Date  . 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 cataract surgery Bilateral 05/24/2014   second eye 06/07/2014  . MELANOMA EXCISION    . OVARIAN CYST REMOVAL     EXPLORATORY LAPAROTOMY  . TONSILLECTOMY      There were no vitals filed for this visit.  Subjective Assessment - 07/25/17 1440    Subjective   Melody Soto presents for Occupational Therapy visit 25/36 to address BLE with CDT. Pt presents with compression wraps in place on RLE.  Spouse of recently deceased patient participates in 10 minute portion of session as she donated  Flexitouch Pluse ([employer]) sequential  pneumatic compression device (pump) to patient.  Pt's cardiologist reviewed device info and OKd device at Pt's last appointment.    Pertinent History  BLE swelling w/ insideous onset in early 40's- no known precipitating event; no known family hx of limb swelling; Transcatheter aortic valve  replacement 02/2017;  Hx depression; orbid obesity; HTN; FIGO stage II Endometrial cancer (HCC); external XRT and brachytherapy complete; hx melanoma excision; urinary incontinence,     Limitations  chronic leg pain and swelling, R>L; limits ability to participate in activities requiring standing and walking > 30 minutes, ; contributes to difficulty reaching feet to inspect skin, perform nail and sking care, and bathe feet and distal legs, LB bathing;,contributes. L Chronic leg swelling limits ability to dress and fit LB clothing and street shoes,. Chronic, progressive BLE lymphedema  contributes to impaired body image and depression.    Special Tests  Lymphedema Life Impact Scale (LLIS) score- baseline = 32, which translates to 47% iperceived impairement due to lymphedema over the past week    Patient Stated Goals  get the swelling and pain in my legs down so I can walk more and wear normal shoes    Currently in Pain?  No/denies    Pain Onset  More than a month ago > 20 years                   OT Treatments/Exercises (OP) - 07/25/17 0001      ADLs   ADL Education Given  Yes      Manual Therapy   Manual Therapy  Edema management    Compression Bandaging  RLE knee length compressi9on  wrap as established             OT Education - 07/25/17 1443    Education provided  Yes    Education Details  Pt edu for care and use guidelines for compression pump. Faxed serial number and garment info to manufacture's  rep and requested training for Pt at her home.    Person(s) Educated  Patient    Methods  Explanation;Demonstration;Handout;Verbal cues;Tactile cues    Comprehension  Verbalized understanding;Returned demonstration;Need further instruction;Verbal cues required;Tactile cues required    Person(s) Educated  Patient    Methods  Explanation;Demonstration    Comprehension  Verbalized understanding;Returned demonstration;Need further instruction          OT Long Term Goals -  07/18/17 1122      OT LONG TERM GOAL #1   Title  Pt modified independent w/ lymphedema precautions/prevention principals and using printed reference to limit LE progression and infection risk.  (Pended)     Baseline  Max A  (Pended)     Time  10  (Pended)     Period  Days  (Pended)     Status  Achieved  (Pended)       OT LONG TERM GOAL #2   Title  Lymphedema (LE) management/ self-care: Pt able to apply knee -length gradient compression wraps with extra time and printed reference PRN (modified independence) within 10 visits to achieve optimal limb volume reduction during Intensive Phase CDT.  (Pended)     Baseline  dependent  (Pended)     Time  10  (Pended)     Period  Days  (Pended)     Status  Achieved  (Pended)       OT LONG TERM GOAL #3   Title  Lymphedema (LE) management/ self-care:  Pt to achieve at least 10% limb volume reductions below knees bilaterally during Intensive Phase CDT to limit LE progression, to improve tissue integrity, to decrease infection risk and to improve functional arm and hand use essential for basic and instrumental ADLs performance.  (Pended)     Baseline  dependent  06/10/2017: Achieved for RLE leg  . Partially cachieved for LLE thigh and overall LVR  (Pended)     Time  12  (Pended)     Period  Weeks  (Pended)     Status  Partially Met  (Pended)       OT LONG TERM GOAL #4   Title  Lymphedema (LE) management/ self-care:  Pt to tolerate daily compression wraps, compression garments and/ or HOS devices in keeping w/ prescribed wear regime within 1 week of issue date of each to progress and retain clinical and functional gains and to limit LE progression.  (Pended)     Baseline  Max A  (Pended)     Time  12  (Pended)     Period  Weeks  (Pended)     Status  Achieved  (Pended)       OT LONG TERM GOAL #5   Title  Lymphedema (LE) management/ self-care:  During Management Phase CDT Pt to sustain current limb volumes within 5%, and all other clinical gains achieved  during  OT for CDT.OT treatment independently (to control limb swelling and associated pain, to  limit LE progression, to decrease infection risk and to limit further functional decline.  (Pended)     Baseline  Max A  (Pended)     Time  6  (Pended)     Period  Months  (Pended)     Status  On-going  (Pended)             Plan - 07/25/17 1445    Clinical Impression Statement  Emphasis of session on Pt edu for fitting and donning flexitouch garments, starting device and running LE program. After skilled teaching Pt able to don garments over her clothing with Max assist. Pt tolerated short sequence with core stimulation. Pt took ower's manual to study and left device at clinic until next week when we can continue training. Cont as per OPC.    Occupational performance deficits (Please refer to evaluation for details):  ADL's;IADL's;Work;Leisure;Social Participation;Other body image    Rehab Potential  Good    OT Frequency  3x / week    OT Duration  12 weeks    OT Treatment/Interventions  Self-care/ADL training;Therapeutic exercise;Manual Therapy;Manual lymph drainage;Therapeutic activities;DME and/or AE instruction;Compression bandaging;Other (comment);Patient/family education skin care with low ph lotion and / or castor oil to improve skin hydration and flexibility    Clinical Decision Making  Several treatment options, min-mod task modification necessary    Recommended Other Services  fit with knee length, custom, ccl 2-3, flat knit compression stockings, and knee length HOS devices to facilitate improved lymphatic function and decrease fibrosis formation during HOS    Consulted and Agree with Plan of Care  Patient       Patient will benefit from skilled therapeutic intervention in order to improve the following deficits and impairments:  Decreased skin integrity, Decreased knowledge of precautions, Decreased knowledge of use of DME, Impaired flexibility, Decreased balance, Decreased mobility,  Obesity, Decreased range of motion, Increased edema, Pain  Visit Diagnosis: Lymphedema, not elsewhere classified    Problem List Patient Active Problem List   Diagnosis Date Noted  . Other neutropenia ([place]) 07/15/2017  . Endometrial cancer ([place]) 04/10/2017  . 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 Chronic acquired lymphedema 01/08/2017  . Obesity, morbid, BMI 40.0-49.9 ([place]) 11/13/2016  . FIGO stage II endometrial cancer ([place]) 10/29/2016  . Left ventricular hypertrophy 11/27/2014  . Lymphedema of lower extremity 08/23/2014  . Severe aortic stenosis 08/21/2014  . Benign essential HTN 08/21/2014  . Depression, major, recurrent, mild ([place]) 08/21/2014  . Dyslipidemia 08/21/2014  . Cardiac dysrhythmia 08/21/2014  . Dysmetabolic syndrome 94/58/5929  . Symptomatic menopausal or female climacteric states 08/21/2014  . 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 History of shingles 08/21/2014  . Personal history of skin cancer 11/24/2009   Andrey Spearman, MS, OTR/L, [facility] 07/25/17 2:51 PM   [place] MAIN [facility] SERVICES [address] [place], [place], [address] Phone: [phone]   Fax:  [phone]  Name: Melody Soto MRN: [id number] Date of Birth: [birth year]

## 2017-07-27 ENCOUNTER — Other Ambulatory Visit: Payer: Self-pay | Admitting: Family Medicine

## 2017-07-29 ENCOUNTER — Ambulatory Visit: Payer: Medicare HMO | Admitting: Occupational Therapy

## 2017-07-29 DIAGNOSIS — I890 Lymphedema, not elsewhere classified: Secondary | ICD-10-CM | POA: Diagnosis not present

## 2017-07-29 NOTE — Therapy (Signed)
[place] MAIN [facility] SERVICES [address] [place], [place], [address] Phone: [phone]   Fax:  [phone]  Occupational Therapy Treatment  Patient Details  Name: Melody Soto MRN: [id number] Date of Birth: February 09, 1951 Referring Provider: Steele Sizer, MD   Encounter Date: 07/29/2017  OT End of Session - 07/29/17 1703    Visit Number  27    Number of Visits  36    Date for OT Re-Evaluation  07/30/17    OT Start Time  1110    OT Stop Time  1235    OT Time Calculation (min)  85 min    Activity Tolerance  Patient tolerated treatment well;No increased pain    Behavior During Therapy  WFL for tasks assessed/performed       Past Medical History:  Diagnosis Date  . Aortic stenosis   . Cataract   . Depression   . Endometrial cancer determined by uterine biopsy ([place]) 10/29/2016  . Heart murmur    HX OF  . Hyperlipidemia   . Hypertension    CONTROLLED ON MEDS  . Lymphedema   . Shortness of breath dyspnea     Past Surgical History:  Procedure Laterality Date  . 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 cataract surgery Bilateral 05/24/2014   second eye 06/07/2014  . MELANOMA EXCISION    . OVARIAN CYST REMOVAL     EXPLORATORY LAPAROTOMY  . TONSILLECTOMY      There were no vitals filed for this visit.  Subjective Assessment - 07/29/17 1700    Subjective   Melody Soto presents for Occupational Therapy visit 27/36 to address BLE with CDT. Pt presents with compression wraps in place on RLE.  Emphasis of visit today on Flexitouch sequential pneumatic device trial and edu for self care and use of pump.    Pertinent History  BLE swelling w/ insideous onset in early 40's- no known precipitating event; no known family hx of limb swelling; Transcatheter aortic valve replacement 02/2017;  Hx depression; orbid obesity; HTN; FIGO stage II Endometrial cancer (HCC); external XRT and brachytherapy complete; hx melanoma excision; urinary  incontinence,     Limitations  chronic leg pain and swelling, R>L; limits ability to participate in activities requiring standing and walking > 30 minutes, ; contributes to difficulty reaching feet to inspect skin, perform nail and sking care, and bathe feet and distal legs, LB bathing;,contributes. L Chronic leg swelling limits ability to dress and fit LB clothing and street shoes,. Chronic, progressive BLE lymphedema  contributes to impaired body image and depression.    Special Tests  Lymphedema Life Impact Scale (LLIS) score- baseline = 32, which translates to 47% iperceived impairement due to lymphedema over the past week    Patient Stated Goals  get the swelling and pain in my legs down so I can walk more and wear normal shoes    Currently in Pain?  No/denies    Pain Onset  More than a month ago > 20 years                   OT Treatments/Exercises (OP) - 07/29/17 0001      ADLs   ADL Education Given  Yes      Manual Therapy   Manual Therapy  Edema management;Compression Bandaging    Manual therapy comments  RLE Flexitouch trial on full RLE  20-30 mmHg    Compression Bandaging  Pt to wrap RLE foot to knee at home  due to extended session             OT Education - 07/29/17 1218    Education provided  Yes    Education Details  Pt educated on donning/ doffing Flexitouch garments, care and yuse of pump, precautions and contraindications.     Person(s) Educated  Patient    Methods  Explanation;Demonstration;Tactile cues;Verbal cues;Handout    Comprehension  Verbalized understanding;Returned demonstration;Verbal cues required;Tactile cues required;Need further instruction          OT Long Term Goals - 07/18/17 1122      OT LONG TERM GOAL #1   Title  Pt modified independent w/ lymphedema precautions/prevention principals and using printed reference to limit LE progression and infection risk.  (Pended)     Baseline  Max A  (Pended)     Time  10  (Pended)      Period  Days  (Pended)     Status  Achieved  (Pended)       OT LONG TERM GOAL #2   Title  Lymphedema (LE) management/ self-care: Pt able to apply knee -length gradient compression wraps with extra time and printed reference PRN (modified independence) within 10 visits to achieve optimal limb volume reduction during Intensive Phase CDT.  (Pended)     Baseline  dependent  (Pended)     Time  10  (Pended)     Period  Days  (Pended)     Status  Achieved  (Pended)       OT LONG TERM GOAL #3   Title  Lymphedema (LE) management/ self-care:  Pt to achieve at least 10% limb volume reductions below knees bilaterally during Intensive Phase CDT to limit LE progression, to improve tissue integrity, to decrease infection risk and to improve functional arm and hand use essential for basic and instrumental ADLs performance.  (Pended)     Baseline  dependent  06/10/2017: Achieved for RLE leg  . Partially cachieved for LLE thigh and overall LVR  (Pended)     Time  12  (Pended)     Period  Weeks  (Pended)     Status  Partially Met  (Pended)       OT LONG TERM GOAL #4   Title  Lymphedema (LE) management/ self-care:  Pt to tolerate daily compression wraps, compression garments and/ or HOS devices in keeping w/ prescribed wear regime within 1 week of issue date of each to progress and retain clinical and functional gains and to limit LE progression.  (Pended)     Baseline  Max A  (Pended)     Time  12  (Pended)     Period  Weeks  (Pended)     Status  Achieved  (Pended)       OT LONG TERM GOAL #5   Title  Lymphedema (LE) management/ self-care:  During Management Phase CDT Pt to sustain current limb volumes within 5%, and all other clinical gains achieved during  OT for CDT.OT treatment independently (to control limb swelling and associated pain, to  limit LE progression, to decrease infection risk and to limit further functional decline.  (Pended)     Baseline  Max A  (Pended)     Time  6  (Pended)     Period   Months  (Pended)     Status  On-going  (Pended)             Plan - 07/29/17 1224    Clinical Impression Statement  Provided extensive Pt education   for care and use of Flexitouch sequential pneumatic compression device donated to Pt, including care and use of device, precautions and contraindications, donning and dofgfing. Pt completed 60 minute trial in clinic on full RLE and deep abdominal lymphatics without difficulty. Manyufacturer's rep will also provide ho88m training. Cont as per PLan odf care.     Occupational performance deficits (Please refer to evaluation for details):  ADL's;IADL's;Work;Leisure;Social Participation;Other body image    Rehab Potential  Good    OT Frequency  3x / week    OT Duration  12 weeks    OT Treatment/Interventions  Self-care/ADL training;Therapeutic exercise;Manual Therapy;Manual lymph drainage;Therapeutic activities;DME and/or AE instruction;Compression bandaging;Other (comment);Patient/family education skin care with low ph lotion and / or castor oil to improve skin hydration and flexibility    Clinical Decision Making  Several treatment options, min-mod task modification necessary    Recommended Other Services  fit with knee length, custom, ccl 2-3, flat knit compression stockings, and knee length HOS devices to facilitate improved lymphatic function and decrease fibrosis formation during HOS    Consulted and Agree with Plan of Care  Patient       Patient will benefit from skilled therapeutic intervention in order to improve the following deficits and impairments:  Decreased skin integrity, Decreased knowledge of precautions, Decreased knowledge of use of DME, Impaired flexibility, Decreased balance, Decreased mobility, Obesity, Decreased range of motion, Increased edema, Pain  Visit Diagnosis: Lymphedema, not elsewhere classified    Problem List Patient Active Problem List   Diagnosis Date Noted  . Other neutropenia ([place] 07/15/2017  .  [place]omet[place] 04/10/2017  . 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 Chronic acquired lymphedema 01/08/2017  . Obesity, morbid, BMI 40.0-49.9 ([place] 11/13/2016  . FIGO stage II endometrial cancer ([place] 10/29/2016  . Left ventricular hypertrophy 11/27/2014  . Lymphedema of lower extremity 08/23/2014  . Severe aortic stenosis 08/21/2014  . Benign essential HTN 08/21/2014  . Depression, major, recurrent, mild ([place] 08/21/2014  . Dyslipidemia 08/21/2014  . Cardiac dysrhythmia 08/21/2014  . Dysmetabolic syndrome 030/13/1438 . Symptomatic menopausal or female climacteric states 08/21/2014  . 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 History of shingles 08/21/2014  . Personal history of skin cancer 11/24/2009    TAndrey Spearman MS, OTR/L, [facility]06/17/19 5:05 PM  [place] [facility] CareSERVICES [address] [address]Phone: [phone]  Fax:  [phone] Name: Melody BESECKERMRN: [id number] of Birth: 925-Aug-1952

## 2017-07-29 NOTE — Patient Instructions (Signed)
Instructions for Flexitouch Sequential Pneumatic Compression Device ( or a Lymphedema Pump)  1. Use Flexitouch daily on alternating legs.  2. Start with R on one day, then use on L the the alternate day. 3. Do not treat 2 legs on the same day at the same, OR at different times. Treat only one leg per day .  4. DO NOT USE Flexi if you have atypical shortness of breath, signs or symptoms of cellulitis, an open wound, a blood clot , or active cancer.

## 2017-07-30 ENCOUNTER — Ambulatory Visit: Payer: Medicare HMO | Admitting: Occupational Therapy

## 2017-07-30 DIAGNOSIS — I890 Lymphedema, not elsewhere classified: Secondary | ICD-10-CM | POA: Diagnosis not present

## 2017-07-31 ENCOUNTER — Ambulatory Visit
Admission: RE | Admit: 2017-07-31 | Discharge: 2017-07-31 | Disposition: A | Discharge status: Home / Self Care | Payer: Medicare HMO | Source: Ambulatory Visit | Attending: Obstetrics and Gynecology | Admitting: Obstetrics and Gynecology

## 2017-07-31 DIAGNOSIS — K5730 Diverticulosis of large intestine without perforation or abscess without bleeding: Secondary | ICD-10-CM | POA: Insufficient documentation

## 2017-07-31 DIAGNOSIS — K760 Fatty (change of) liver, not elsewhere classified: Secondary | ICD-10-CM | POA: Insufficient documentation

## 2017-07-31 DIAGNOSIS — C541 Malignant neoplasm of endometrium: Secondary | ICD-10-CM | POA: Diagnosis not present

## 2017-07-31 DIAGNOSIS — K8020 Calculus of gallbladder without cholecystitis without obstruction: Secondary | ICD-10-CM | POA: Diagnosis not present

## 2017-07-31 MED ORDER — IOPAMIDOL (ISOVUE-370) INJECTION 76%
100.0000 mL | Freq: Once | INTRAVENOUS | Status: AC | PRN
  Administered 2017-07-31: 100 mL via INTRAVENOUS

## 2017-07-31 MED ORDER — IOPAMIDOL (ISOVUE-300) INJECTION 61%: 100.0000 mL | Freq: Once | INTRAVENOUS | Status: DC | PRN

## 2017-08-01 ENCOUNTER — Ambulatory Visit: Payer: Medicare HMO | Admitting: Occupational Therapy

## 2017-08-01 NOTE — Therapy (Signed)
[place] MAIN [facility] SERVICES [address] [place], [place], [address] Phone: [phone]   Fax:  [phone]  Occupational Therapy Treatment Note and Progress Report  Patient Details  Name: Melody Soto MRN: [id number] Date of Birth: March 07, 1950 Referring Provider: Steele Sizer, MD   Encounter Date: 07/30/2017    Past Medical History:  Diagnosis Date  . Aortic stenosis   . Cataract   . Depression   . Endometrial cancer determined by uterine biopsy ([place]) 10/29/2016  . Heart murmur    HX OF  . Hyperlipidemia   . Hypertension    CONTROLLED ON MEDS  . Lymphedema   . Shortness of breath dyspnea     Past Surgical History:  Procedure Laterality Date  . 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 cataract surgery Bilateral 05/24/2014   second eye 06/07/2014  . MELANOMA EXCISION    . OVARIAN CYST REMOVAL     EXPLORATORY LAPAROTOMY  . TONSILLECTOMY      There were no vitals filed for this visit.                             OT Long Term Goals - 08/01/17 1435      OT LONG TERM GOAL #1   Title  Pt modified independent w/ lymphedema precautions/prevention principals and using printed reference to limit LE progression and infection risk.    Baseline  Max A    Time  10    Period  Days    Status  Achieved      OT LONG TERM GOAL #2   Title  Lymphedema (LE) management/ self-care: Pt able to apply knee -length gradient compression wraps with extra time and printed reference PRN (modified independence) within 10 visits to achieve optimal limb volume reduction during Intensive Phase CDT.    Baseline  dependent    Time  10    Period  Days    Status  Achieved      OT LONG TERM GOAL #3   Title  Lymphedema (LE) management/ self-care:  Pt to achieve at least 10% limb volume reductions below knees bilaterally during Intensive Phase CDT to limit LE progression, to improve tissue integrity, to decrease  infection risk and to improve functional arm and hand use essential for basic and instrumental ADLs performance.    Baseline  dependent  06/10/2017: Achieved for RLE leg  . Partially cachieved for LLE thigh and overall LVR    Time  12    Period  Weeks    Status  Partially Met      OT LONG TERM GOAL #4   Title  Lymphedema (LE) management/ self-care:  Pt to tolerate daily compression wraps, compression garments and/ or HOS devices in keeping w/ prescribed wear regime within 1 week of issue date of each to progress and retain clinical and functional gains and to limit LE progression.    Baseline  Max A    Time  12    Period  Weeks    Status  Achieved      OT LONG TERM GOAL #5   Title  Lymphedema (LE) management/ self-care:  During Management Phase CDT Pt to sustain current limb volumes within 5%, and all other clinical gains achieved during  OT for CDT.OT treatment independently (to control limb swelling and associated pain, to  limit LE progression, to decrease infection risk and to limit further functional decline.  Baseline  Max A    Time  6    Period  Months    Status  On-going            Plan - 08/01/17 1426    Clinical Impression Statement  Todate Pt continues to make steady progress towards all OT goals for LE self management and  limb volume reduction. She has achieved a 6.3% limb volume reduction in the R leg below the knee to the ankle. She has achieved a 5.8% limb volume reduction in the R thigh.. Overall  RLE limb volume reduction from ankle to thigh measures 5.95%. Although these values do not yet meet the 10% overall reduction goal, progress has been steady, pt has demonstrated ongoing improvement in compliance with compression wraps, and she reports significant decrease in RLE leg pain and   discomfort.  Typically volumetric reductions in case of lipo-lymphedema lag  significantly behine reductions in lymphedema not associated with abnormal excessive fat storage. I expect Pt  will  eventually achieve the 10% reduction in both legs below the knees if she remains compliant with self care as recommended. SE  Long Term Goals section for additional detaon.Hopefully custom knee length garment will be delivered next week so garment can be fitted. Pt agrees with plan to decrease visits to 1 x weekly to conserve vists. She will utilize pneumatic device on alternating LEs b during visit intervals.    Occupational performance deficits (Please refer to evaluation for details):  ADL's;IADL's;Work;Leisure;Social Participation;Other body image    Rehab Potential  Good    OT Frequency  3x / week    OT Duration  12 weeks    OT Treatment/Interventions  Self-care/ADL training;Therapeutic exercise;Manual Therapy;Manual lymph drainage;Therapeutic activities;DME and/or AE instruction;Compression bandaging;Other (comment);Patient/family education skin care with low ph lotion and / or castor oil to improve skin hydration and flexibility    Clinical Decision Making  Several treatment options, min-mod task modification necessary    Recommended Other Services  fit with knee length, custom, ccl 2-3, flat knit compression stockings, and knee length HOS devices to facilitate improved lymphatic function and decrease fibrosis formation during HOS    Consulted and Agree with Plan of Care  Patient       Patient will benefit from skilled therapeutic intervention in order to improve the following deficits and impairments:  Decreased skin integrity, Decreased knowledge of precautions, Decreased knowledge of use of DME, Impaired flexibility, Decreased balance, Decreased mobility, Obesity, Decreased range of motion, Increased edema, Pain  Visit Diagnosis: Lymphedema, not elsewhere classified - Plan: Ot plan of care cert/re-cert    Problem List Patient Active Problem List   Diagnosis Date Noted  . Other neutropenia ([place]) 07/15/2017  . Endometrial cancer ([place]) 04/10/2017  . S/P TAVR (transcatheter aortic  valve replacement) 02/27/2017  . Chronic acquired lymphedema 01/08/2017  . Obesity, morbid, BMI 40.0-49.9 ([place]) 11/13/2016  . FIGO stage II endometrial cancer ([place]) 10/29/2016  . Left ventricular hypertrophy 11/27/2014  . Lymphedema of lower extremity 08/23/2014  . Severe aortic stenosis 08/21/2014  . Benign essential HTN 08/21/2014  . Depression, major, recurrent, mild ([place]) 08/21/2014  . Dyslipidemia 08/21/2014  . Cardiac dysrhythmia 08/21/2014  . Dysmetabolic syndrome 87/86/7672  . Symptomatic menopausal or female climacteric states 08/21/2014  . 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 History of shingles 08/21/2014  . Personal history of skin cancer 11/24/2009   Andrey Spearman, MS, OTR/L, CLT-LANA 08/01/17 2:40 PM   [place] MAIN REHAB SERVICES  [place], [place], [address] Phone: [phone]   Fax:  [phone]  Name: Melody Soto MRN: [id number] Date of Birth: 02-25-1950

## 2017-08-05 ENCOUNTER — Ambulatory Visit: Payer: Medicare HMO | Admitting: Occupational Therapy

## 2017-08-06 ENCOUNTER — Ambulatory Visit: Payer: Medicare HMO | Admitting: Occupational Therapy

## 2017-08-06 ENCOUNTER — Telehealth: Payer: Self-pay

## 2017-08-06 NOTE — Telephone Encounter (Signed)
Voicemail left with Melody Soto to return call for CT results. Results left on name identified voicemail. She has appointment with Dr. Baruch Gouty 6/28 for 6 month follow up and full CT review. She knows to call with any questions or to discuss CT results further.  IMPRESSION: Negative. No evidence of recurrent or metastatic carcinoma within the abdomen or pelvis.  Cholelithiasis.  No radiographic evidence of cholecystitis.  Mild hepatic steatosis.  Colonic diverticulosis, without radiographic evidence of diverticulitis.   Oncology Nurse Navigator Documentation  Navigator Location: [facility] (08/06/17 0900)   )Navigator Encounter Type: Telephone (08/06/17 0900) Telephone: Outgoing Call;Diagnostic Results (08/06/17 0900)                                                  Time Spent with Patient: 15 (08/06/17 0900)

## 2017-08-08 ENCOUNTER — Ambulatory Visit: Payer: Medicare HMO | Admitting: Occupational Therapy

## 2017-08-08 DIAGNOSIS — I890 Lymphedema, not elsewhere classified: Secondary | ICD-10-CM

## 2017-08-08 NOTE — Therapy (Signed)
[place] MAIN [facility] SERVICES [address] [place], [place], [address] Phone: [phone]   Fax:  [phone]  Occupational Therapy Treatment  Patient Details  Name: Melody Soto MRN: [id number] Date of Birth: Jul 03, 1950 Referring Provider: Steele Sizer, MD   Encounter Date: 08/08/2017  OT End of Session - 08/08/17 1650    Visit Number  29    Number of Visits  36    Date for OT Re-Evaluation  10/30/17    OT Start Time  1115    OT Stop Time  1217    OT Time Calculation (min)  62 min    Activity Tolerance  Patient tolerated treatment well;No increased pain    Behavior During Therapy  WFL for tasks assessed/performed       Past Medical History:  Diagnosis Date  . Aortic stenosis   . Cataract   . Depression   . Endometrial cancer determined by uterine biopsy ([place]) 10/29/2016  . Heart murmur    HX OF  . Hyperlipidemia   . Hypertension    CONTROLLED ON MEDS  . Lymphedema   . Shortness of breath dyspnea     Past Surgical History:  Procedure Laterality Date  . 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 cataract surgery Bilateral 05/24/2014   second eye 06/07/2014  . MELANOMA EXCISION    . OVARIAN CYST REMOVAL     EXPLORATORY LAPAROTOMY  . TONSILLECTOMY      There were no vitals filed for this visit.  Subjective Assessment - 08/08/17 1122    Subjective   Ms. Latella presents for Occupational Therapy visit 29/36 to address BLE with CDT. Pt presents with compression wraps in place on RLE.  Emphasis of visit today on Flexitouch sequential pneumatic device trial and edu for self care and use of pump.    Pertinent History  BLE swelling w/ insideous onset in early 40's- no known precipitating event; no known family hx of limb swelling; Transcatheter aortic valve replacement 02/2017;  Hx depression; orbid obesity; HTN; FIGO stage II Endometrial cancer ([facility]); external XRT and brachytherapy complete; hx melanoma excision; urinary  incontinence,     Limitations  chronic leg pain and swelling, R>L; limits ability to participate in activities requiring standing and walking > 30 minutes, ; contributes to difficulty reaching feet to inspect skin, perform nail and sking care, and bathe feet and distal legs, LB bathing;,contributes. L Chronic leg swelling limits ability to dress and fit LB clothing and street shoes,. Chronic, progressive BLE lymphedema  contributes to impaired body image and depression.    Special Tests  Lymphedema Life Impact Scale (LLIS) score- baseline = 32, which translates to 47% iperceived impairement due to lymphedema over the past week    Patient Stated Goals  get the swelling and pain in my legs down so I can walk more and wear normal shoes    Pain Onset  More than a month ago > 20 years                           OT Education - 08/08/17 1650    Education provided  Yes    Education Details  Continued skilled Pt/caregiver education  And LE ADL training throughout visit for lymphedema self care/ home program, including compression wrapping, compression garment and device wear/care, lymphatic pumping ther ex, simple self-MLD, and skin care. Discussed progress towards goals.     Person(s) Educated  Patient  Methods  Explanation;Demonstration;Tactile cues;Verbal cues;Handout    Comprehension  Verbalized understanding;Returned demonstration;Verbal cues required;Tactile cues required;Need further instruction          OT Long Term Goals - 08/01/17 1435      OT LONG TERM GOAL #1   Title  Pt modified independent w/ lymphedema precautions/prevention principals and using printed reference to limit LE progression and infection risk.    Baseline  Max A    Time  10    Period  Days    Status  Achieved      OT LONG TERM GOAL #2   Title  Lymphedema (LE) management/ self-care: Pt able to apply knee -length gradient compression wraps with extra time and printed reference PRN (modified  independence) within 10 visits to achieve optimal limb volume reduction during Intensive Phase CDT.    Baseline  dependent    Time  10    Period  Days    Status  Achieved      OT LONG TERM GOAL #3   Title  Lymphedema (LE) management/ self-care:  Pt to achieve at least 10% limb volume reductions below knees bilaterally during Intensive Phase CDT to limit LE progression, to improve tissue integrity, to decrease infection risk and to improve functional arm and hand use essential for basic and instrumental ADLs performance.    Baseline  dependent  06/10/2017: Achieved for RLE leg  . Partially cachieved for LLE thigh and overall LVR    Time  12    Period  Weeks    Status  Partially Met      OT LONG TERM GOAL #4   Title  Lymphedema (LE) management/ self-care:  Pt to tolerate daily compression wraps, compression garments and/ or HOS devices in keeping w/ prescribed wear regime within 1 week of issue date of each to progress and retain clinical and functional gains and to limit LE progression.    Baseline  Max A    Time  12    Period  Weeks    Status  Achieved      OT LONG TERM GOAL #5   Title  Lymphedema (LE) management/ self-care:  During Management Phase CDT Pt to sustain current limb volumes within 5%, and all other clinical gains achieved during  OT for CDT.OT treatment independently (to control limb swelling and associated pain, to  limit LE progression, to decrease infection risk and to limit further functional decline.    Baseline  Max A    Time  6    Period  Months    Status  On-going            Plan - 08/08/17 1651    Clinical Impression Statement  Pt making transition to management -phase CDT very well. She has used pump 5 days out of last 7 and states she has lost a few more pounds this week. Pt is using compression wraps daily on RLE. Skin is well hydrated, and Pt reorts decreased pain in R leg since commencing CDT. Commenced CDT to LLE today. provided MLD and compression wraps  as established on LLE. Cont 1 x weekly as per POC while awaiting custom garment delivery and fitting to RLE.     Occupational performance deficits (Please refer to evaluation for details):  ADL's;IADL's;Work;Leisure;Social Participation;Other body image    Rehab Potential  Good    OT Frequency  3x / week    OT Duration  12 weeks    OT Treatment/Interventions  Self-care/ADL training;Therapeutic exercise;Manual  Therapy;Manual lymph drainage;Therapeutic activities;DME and/or AE instruction;Compression bandaging;Other (comment);Patient/family education skin care with low ph lotion and / or castor oil to improve skin hydration and flexibility    Clinical Decision Making  Several treatment options, min-mod task modification necessary    Recommended Other Services  fit with knee length, custom, ccl 2-3, flat knit compression stockings, and knee length HOS devices to facilitate improved lymphatic function and decrease fibrosis formation during HOS    Consulted and Agree with Plan of Care  Patient       Patient will benefit from skilled therapeutic intervention in order to improve the following deficits and impairments:  Decreased skin integrity, Decreased knowledge of precautions, Decreased knowledge of use of DME, Impaired flexibility, Decreased balance, Decreased mobility, Obesity, Decreased range of motion, Increased edema, Pain  Visit Diagnosis: Lymphedema, not elsewhere classified    Problem List Patient Active Problem List   Diagnosis Date Noted  . Other neutropenia ([place]) 07/15/2017  . Endometrial cancer ([place]) 04/10/2017  . 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 Chronic acquired lymphedema 01/08/2017  . Obesity, morbid, BMI 40.0-49.9 ([place]) 11/13/2016  . FIGO stage II endometrial cancer ([place]) 10/29/2016  . Left ventricular hypertrophy 11/27/2014  . Lymphedema of lower extremity 08/23/2014  . Severe aortic stenosis 08/21/2014  . Benign essential HTN 08/21/2014  .  Depression, major, recurrent, mild ([place]) 08/21/2014  . Dyslipidemia 08/21/2014  . Cardiac dysrhythmia 08/21/2014  . Dysmetabolic syndrome 54/62/7035  . Symptomatic menopausal or female climacteric states 08/21/2014  . 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 History of shingles 08/21/2014  . Personal history of skin cancer 11/24/2009    Andrey Spearman, MS, OTR/L, [facility] 08/08/17 4:55 PM  [place] MAIN [facility] SERVICES [address] [place], [place], [address] Phone: [phone]   Fax:  [phone]  Name: Melody Soto MRN: [id number] Date of Birth: [birth year]

## 2017-08-09 ENCOUNTER — Ambulatory Visit
Admission: RE | Admit: 2017-08-09 | Discharge: 2017-08-09 | Disposition: A | Discharge status: Home / Self Care | Payer: Medicare HMO | Source: Ambulatory Visit | Attending: Radiation Oncology | Admitting: Radiation Oncology

## 2017-08-09 DIAGNOSIS — I890 Lymphedema, not elsewhere classified: Secondary | ICD-10-CM | POA: Insufficient documentation

## 2017-08-09 DIAGNOSIS — R32 Unspecified urinary incontinence: Secondary | ICD-10-CM | POA: Diagnosis not present

## 2017-08-09 DIAGNOSIS — Z923 Personal history of irradiation: Secondary | ICD-10-CM | POA: Insufficient documentation

## 2017-08-09 DIAGNOSIS — K8020 Calculus of gallbladder without cholecystitis without obstruction: Secondary | ICD-10-CM | POA: Insufficient documentation

## 2017-08-09 DIAGNOSIS — Z8542 Personal history of malignant neoplasm of other parts of uterus: Secondary | ICD-10-CM | POA: Diagnosis not present

## 2017-08-09 DIAGNOSIS — C541 Malignant neoplasm of endometrium: Secondary | ICD-10-CM

## 2017-08-09 NOTE — Progress Notes (Signed)
Radiation Oncology Follow up Note  Name: Melody Soto   Date:   08/09/2017 MRN:  [id number] DOB: Apr 09, 1950    This 67 y.o. female presents to the clinic today for six-month follow-up status post both external beam radiation therapy and brachytherapy for stage II well-differentiated endometrial adenocarcinoma.  REFERRING PROVIDER: Steele Sizer, MD  HPI: patient is a 67 year old female now seen out 6 months having completed both external beam radiation therapy as well as brachytherapy for stage II well-differentiated endometrial out at a adenocarcinoma the patient is a nonsurgical candidate based on multiple cardiac comorbidities. She states she is more incontinent now than she was prior to treatment..she did have her [facility]the[facility] at [facility].she recently had a CT scan which shows no evidence of recurrent or metastatic disease within the abdomen or pelvis. She does have cholelithiasis. She specifically denies diarrhea abdominal pain or vaginal discharge. She does state that the urinary incontinence is improving.  COMPLICATIONS OF TREATMENT: none  FOLLOW UP COMPLIANCE: keeps appointments   PHYSICAL EXAM:  There were no vitals taken for this visit. Obese female in NAD. On speculum examination vaginal vault is clear no vaginal stenosis is noted. No vaginal mucosal lesions are identified. Bimanual examination shows no evidence of parametrial mass or nodularity. Well-developed well-nourished patient in NAD. HEENT reveals PERLA, EOMI, discs not visualized.  Oral cavity is clear. No oral mucosal lesions are identified. Neck is clear without evidence of cervical or supraclavicular adenopathy. Lungs are clear to A&P. Cardiac examination is essentially unremarkable with regular rate and rhythm without murmur rub or thrill. Abdomen is benign with no organomegaly or masses noted. Motor sensory and DTR levels are equal and symmetric in the upper and lower extremities. Cranial nerves II  through XII are grossly intact. Proprioception is intact. No peripheral adenopathy or edema is identified. No motor or sensory levels are noted. Crude visual fields are within normal range.  RADIOLOGY RESULTS: CT scan is reviewed and compatible with the above-stated findings  PLAN: present time patient is doing well with no evidence of disease by CT criteria and physical examination. I'm please were overall progress. She has had a history of lymphedema in her lower extremities is seeing the lymphedema clinic for that. I have suggested urology for evaluation of her incontinence although she says it is improving. I've asked to see her back in 6 months for follow-up. Patient knows to call with any concerns. She continues close follow-up care with GYN oncology clinic.  I would like to take this opportunity to thank you for allowing me to participate in the care of your patient.Noreene Filbert, MD

## 2017-08-20 ENCOUNTER — Ambulatory Visit
Admission: RE | Admit: 2017-08-20 | Discharge: 2017-08-20 | Disposition: A | Discharge status: Home / Self Care | Payer: Medicare HMO | Source: Ambulatory Visit | Attending: Family Medicine | Admitting: Family Medicine

## 2017-08-20 DIAGNOSIS — E2839 Other primary ovarian failure: Secondary | ICD-10-CM | POA: Diagnosis not present

## 2017-08-20 DIAGNOSIS — M85851 Other specified disorders of bone density and structure, right thigh: Secondary | ICD-10-CM | POA: Diagnosis not present

## 2017-08-20 DIAGNOSIS — Z780 Asymptomatic menopausal state: Secondary | ICD-10-CM | POA: Diagnosis not present

## 2017-08-22 ENCOUNTER — Ambulatory Visit: Payer: Medicare HMO | Attending: Family Medicine | Admitting: Occupational Therapy

## 2017-08-22 DIAGNOSIS — I890 Lymphedema, not elsewhere classified: Secondary | ICD-10-CM | POA: Insufficient documentation

## 2017-08-22 NOTE — Therapy (Signed)
[place] MAIN [facility] SERVICES [address] [place], [place], [address] Phone: [phone]   Fax:  [phone]  Occupational Therapy Treatment Note and Progress Report  Patient Details  Name: Melody Soto MRN: [id number] Date of Birth: 11/12/1950 Referring Provider: Steele Sizer, MD   Encounter Date: 08/22/2017  OT End of Session - 08/22/17 1318    Visit Number  30    Number of Visits  36    Date for OT Re-Evaluation  10/30/17    OT Start Time  0900    OT Stop Time  1015    OT Time Calculation (min)  75 min    Activity Tolerance  Patient tolerated treatment well;No increased pain    Behavior During Therapy  WFL for tasks assessed/performed       Past Medical History:  Diagnosis Date  . Aortic stenosis   . Cataract   . Depression   . Endometrial cancer determined by uterine biopsy ([place]) 10/29/2016  . Heart murmur    HX OF  . Hyperlipidemia   . Hypertension    CONTROLLED ON MEDS  . Lymphedema   . Shortness of breath dyspnea     Past Surgical History:  Procedure Laterality Date  . 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 cataract surgery Bilateral 05/24/2014   second eye 06/07/2014  . MELANOMA EXCISION    . OVARIAN CYST REMOVAL     EXPLORATORY LAPAROTOMY  . TONSILLECTOMY      There were no vitals filed for this visit.  Subjective Assessment - 08/22/17 1319    Subjective   Ms. Dollard presents for Occupational Therapy visit 30/36 to address BLE with CDT. Pt presents with new custom compression knee high in place on R leg, and gradient compression wrap  from foot to knee on LLE. Pt last seen for OT on 6/27. Pt states she has been manageing LE home program well during visit interval, using  sequential pump nearly daily, wrapping LLE since last week when compression garment arrived. I've been using the night sock too, but it slides down."    Pertinent History  BLE swelling w/ insideous onset in early 40's- no known  precipitating event; no known family hx of limb swelling; Transcatheter aortic valve replacement 02/2017;  Hx depression; orbid obesity; HTN; FIGO stage II Endometrial cancer (HCC); external XRT and brachytherapy complete; hx melanoma excision; urinary incontinence,     Limitations  chronic leg pain and swelling, R>L; limits ability to participate in activities requiring standing and walking > 30 minutes, ; contributes to difficulty reaching feet to inspect skin, perform nail and sking care, and bathe feet and distal legs, LB bathing;,contributes. L Chronic leg swelling limits ability to dress and fit LB clothing and street shoes,. Chronic, progressive BLE lymphedema  contributes to impaired body image and depression.    Special Tests  Lymphedema Life Impact Scale (LLIS) score- baseline = 32, which translates to 47% iperceived impairement due to lymphedema over the past week    Patient Stated Goals  get the swelling and pain in my legs down so I can walk more and wear normal shoes    Currently in Pain?  No/denies    Pain Onset  More than a month ago > 20 years                   OT Treatments/Exercises (OP) - 08/22/17 0001      ADLs   ADL Education Given  Yes  Manual Therapy   Manual Therapy  Edema management;Compression Bandaging    Manual therapy comments  completed new measurements for RLE custom compression garment and HOS device as initial garment and device are too lareg.    Compression Bandaging  LLE gradient compression wrap applied as established from foot to below knee.             OT Education - 08/22/17 1329    Education provided  Yes    Education Details  Continued skilled Pt/caregiver education  And LE ADL training throughout visit for lymphedema self care/ home program, including compression wrapping, compression garment and device wear/care, lymphatic pumping ther ex, simple self-MLD, and skin care. Discussed progress towards goals.     Person(s) Educated   Patient    Methods  Explanation;Demonstration;Tactile cues;Verbal cues;Handout    Comprehension  Verbalized understanding;Returned demonstration;Verbal cues required;Tactile cues required;Need further instruction    Person(s) Educated  Patient    Methods  Explanation;Demonstration;Tactile cues;Verbal cues;Handout    Comprehension  Verbalized understanding;Returned demonstration;Verbal cues required;Tactile cues required;Need further instruction          OT Long Term Goals - 08/22/17 1337      OT LONG TERM GOAL #1   Title  Pt modified independent w/ lymphedema precautions/prevention principals and using printed reference to limit LE progression and infection risk.    Baseline  Max A    Time  10    Period  Days    Status  Achieved      OT LONG TERM GOAL #2   Title  Lymphedema (LE) management/ self-care: Pt able to apply knee -length gradient compression wraps with extra time and printed reference PRN (modified independence) within 10 visits to achieve optimal limb volume reduction during Intensive Phase CDT.    Baseline  dependent    Time  10    Period  Days    Status  Achieved      OT LONG TERM GOAL #3   Title  Lymphedema (LE) management/ self-care:  Pt to achieve at least 10% limb volume reductions below knees bilaterally during Intensive Phase CDT to limit LE progression, to improve tissue integrity, to decrease infection risk and to improve functional arm and hand use essential for basic and instrumental ADLs performance.    Baseline  dependent  06/10/2017: Achieved for RLE leg  . Partially cachieved for LLE thigh and overall LVR.      Time  12    Period  Weeks    Status  Partially Met      OT LONG TERM GOAL #4   Title  Lymphedema (LE) management/ self-care:  Pt to tolerate daily compression wraps, compression garments and/ or HOS devices in keeping w/ prescribed wear regime within 1 week of issue date of each to progress and retain clinical and functional gains and to limit LE  progression.    Baseline  Max A    Time  12    Period  Weeks    Status  Achieved      OT LONG TERM GOAL #5   Title  Lymphedema (LE) management/ self-care:  During Management Phase CDT Pt to sustain current limb volumes within 5%, and all other clinical gains achieved during  OT for CDT.OT treatment independently (to control limb swelling and associated pain, to  limit LE progression, to decrease infection risk and to limit further functional decline.    Baseline  Max A    Time  6    Period  Months    Status  On-going            Plan - 08/22/17 1338    Clinical Impression Statement  Pt continues to demonstrate excellent progress towards all OT goals for LE care. CDT to LLE commenced last week when custom RLE garment and devbice arrived. Pt has managed LE self care exceptionally well during 2 week visit interval. She has been 100% compliant with daily compression, new HOS device and new ccl 2 custom stocking. She has used sequentia pump daily as 8instructed. LLE is visibly decreased in volume upon inspection today. Skin is well hydrated and Pt denies pain. RLE custom garment and HOD devices. Remakes needed to improve fit as al measurements reveal     at least 1.5 to 3 cm addidtioanl volume reductions at all landmarks below the knee,. Pt will cont 1 x weekly and PRN.     Occupational performance deficits (Please refer to evaluation for details):  ADL's;IADL's;Work;Leisure;Social Participation;Other body image    Rehab Potential  Good    OT Frequency  3x / week    OT Duration  12 weeks    OT Treatment/Interventions  Self-care/ADL training;Therapeutic exercise;Manual Therapy;Manual lymph drainage;Therapeutic activities;DME and/or AE instruction;Compression bandaging;Other (comment);Patient/family education skin care with low ph lotion and / or castor oil to improve skin hydration and flexibility    Clinical Decision Making  Several treatment options, min-mod task modification necessary     Recommended Other Services  fit with knee length, custom, ccl 2-3, flat knit compression stockings, and knee length HOS devices to facilitate improved lymphatic function and decrease fibrosis formation during HOS    Consulted and Agree with Plan of Care  Patient       Patient will benefit from skilled therapeutic intervention in order to improve the following deficits and impairments:  Decreased skin integrity, Decreased knowledge of precautions, Decreased knowledge of use of DME, Impaired flexibility, Decreased balance, Decreased mobility, Obesity, Decreased range of motion, Increased edema, Pain  Visit Diagnosis: Lymphedema, not elsewhere classified    Problem List Patient Active Problem List   Diagnosis Date Noted  . Other neutropenia ([place]) 07/15/2017  . Endometrial cancer ([place]) 04/10/2017  . 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 Chronic acquired lymphedema 01/08/2017  . Obesity, morbid, BMI 40.0-49.9 ([place]) 11/13/2016  . FIGO stage II endometrial cancer ([place]) 10/29/2016  . Left ventricular hypertrophy 11/27/2014  . Lymphedema of lower extremity 08/23/2014  . Severe aortic stenosis 08/21/2014  . Benign essential HTN 08/21/2014  . Depression, major, recurrent, mild ([place]) 08/21/2014  . Dyslipidemia 08/21/2014  . Cardiac dysrhythmia 08/21/2014  . Dysmetabolic syndrome 16/57/9038  . Symptomatic menopausal or female climacteric states 08/21/2014  . 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 History of shingles 08/21/2014  . Personal history of skin cancer 11/24/2009    Andrey Spearman, MS, OTR/L, [facility] 08/22/17 1:40 PM  [place] MAIN [facility] SERVICES [address] [place], [place], [address] Phone: [phone]   Fax:  [phone]  Name: CATIA TODOROV MRN: [id number] Date of Birth: 08-10-1950

## 2017-08-22 NOTE — Patient Instructions (Signed)
Day and HOS compression:  1- Wear RLE stocking daily during waking hours. Do not sleep in compression stockings. 2- Wear HOD device  Overnight to limit fibrosis formation and to improve lymph flow during inactivity 3. Utilize sequential pump daily on alternating leg. Do not use pump on both legs simultaneously, or on both legs in same day.This may return too much fluid to your heart. 4. Do not use compression garment , devices, or sequential pump if you have signs of infection, acute onset of pain or increased swelling, or atypical SOB. Call you doctor and discontinue LE devices until cleared to resume.

## 2017-08-29 ENCOUNTER — Ambulatory Visit: Payer: Medicare HMO | Admitting: Occupational Therapy

## 2017-08-29 DIAGNOSIS — I890 Lymphedema, not elsewhere classified: Secondary | ICD-10-CM

## 2017-08-29 NOTE — Therapy (Signed)
[place] MAIN [facility] SERVICES [address] [place], [place], [address] Phone: [phone]   Fax:  [phone]  Occupational Therapy Treatment  Patient Details  Name: Melody Soto MRN: [id number] Date of Birth: 08-25-1950 Referring Provider: Steele Sizer, MD   Encounter Date: 08/29/2017  OT End of Session - 08/29/17 1511    Visit Number  31    Number of Visits  36    Date for OT Re-Evaluation  10/30/17    OT Start Time  1117    OT Stop Time  1217    OT Time Calculation (min)  60 min    Activity Tolerance  Patient tolerated treatment well;No increased pain    Behavior During Therapy  WFL for tasks assessed/performed       Past Medical History:  Diagnosis Date  . Aortic stenosis   . Cataract   . Depression   . Endometrial cancer determined by uterine biopsy ([place]) 10/29/2016  . Heart murmur    HX OF  . Hyperlipidemia   . Hypertension    CONTROLLED ON MEDS  . Lymphedema   . Shortness of breath dyspnea     Past Surgical History:  Procedure Laterality Date  . 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 cataract surgery Bilateral 05/24/2014   second eye 06/07/2014  . MELANOMA EXCISION    . OVARIAN CYST REMOVAL     EXPLORATORY LAPAROTOMY  . TONSILLECTOMY      There were no vitals filed for this visit.  Subjective Assessment - 08/29/17 1502    Subjective   Ms. Ginty presents for Occupational Therapy visit 31/36 to address BLE with CDT. Pt is 15 minutes late for a 60 minute appointment. Pt presents with custom compression knee high in place on R leg, and gradient compression wrap  from foot to knee on LLE. Pt reprts she continues to use her sequential pump nearly daily. Sh isa pleased with RLE custom garment and wears it as directed.    Pertinent History  BLE swelling w/ insideous onset in early 40's- no known precipitating event; no known family hx of limb swelling; Transcatheter aortic valve replacement 02/2017;  Hx  depression; orbid obesity; HTN; FIGO stage II Endometrial cancer (HCC); external XRT and brachytherapy complete; hx melanoma excision; urinary incontinence,     Limitations  chronic leg pain and swelling, R>L; limits ability to participate in activities requiring standing and walking > 30 minutes, ; contributes to difficulty reaching feet to inspect skin, perform nail and sking care, and bathe feet and distal legs, LB bathing;,contributes. L Chronic leg swelling limits ability to dress and fit LB clothing and street shoes,. Chronic, progressive BLE lymphedema  contributes to impaired body image and depression.    Special Tests  Lymphedema Life Impact Scale (LLIS) score- baseline = 32, which translates to 47% iperceived impairement due to lymphedema over the past week    Patient Stated Goals  get the swelling and pain in my legs down so I can walk more and wear normal shoes    Currently in Pain?  No/denies    Pain Onset  More than a month ago > 20 years          LYMPHEDEMA/ONCOLOGY QUESTIONNAIRE - 08/29/17 1504      Left Lower Extremity Lymphedema   Other  LLE limb volume from ankle to below knee (A_D) =5617.43 ml. LLE limb volume for thigh segement ( E-G) =9070.24 ml. RLE Full leg ankle to groin volume (A-G)  measures 14687.67 ml.    Other  LLE leg from ankle to below knee (A-D) is decreased in volume by 7.83% since commencing CDT on 05/13/2017. Thighs segment (E-G) is reduced in volujme by 16.9%. LLE full leg volume is decreased by 13.68% overall since initially measuring volumetrics.               OT Treatments/Exercises (OP) - 08/29/17 0001      ADLs   ADL Education Given  Yes      Manual Therapy   Manual Therapy  Edema management;Compression Bandaging    Manual therapy comments  LLE cmparative limb volumetrics                  OT Long Term Goals - 08/22/17 1337      OT LONG TERM GOAL #1   Title  Pt modified independent w/ lymphedema precautions/prevention  principals and using printed reference to limit LE progression and infection risk.    Baseline  Max A    Time  10    Period  Days    Status  Achieved      OT LONG TERM GOAL #2   Title  Lymphedema (LE) management/ self-care: Pt able to apply knee -length gradient compression wraps with extra time and printed reference PRN (modified independence) within 10 visits to achieve optimal limb volume reduction during Intensive Phase CDT.    Baseline  dependent    Time  10    Period  Days    Status  Achieved      OT LONG TERM GOAL #3   Title  Lymphedema (LE) management/ self-care:  Pt to achieve at least 10% limb volume reductions below knees bilaterally during Intensive Phase CDT to limit LE progression, to improve tissue integrity, to decrease infection risk and to improve functional arm and hand use essential for basic and instrumental ADLs performance.    Baseline  dependent  06/10/2017: Achieved for RLE leg  . Partially cachieved for LLE thigh and overall LVR.      Time  12    Period  Weeks    Status  Partially Met      OT LONG TERM GOAL #4   Title  Lymphedema (LE) management/ self-care:  Pt to tolerate daily compression wraps, compression garments and/ or HOS devices in keeping w/ prescribed wear regime within 1 week of issue date of each to progress and retain clinical and functional gains and to limit LE progression.    Baseline  Max A    Time  12    Period  Weeks    Status  Achieved      OT LONG TERM GOAL #5   Title  Lymphedema (LE) management/ self-care:  During Management Phase CDT Pt to sustain current limb volumes within 5%, and all other clinical gains achieved during  OT for CDT.OT treatment independently (to control limb swelling and associated pain, to  limit LE progression, to decrease infection risk and to limit further functional decline.    Baseline  Max A    Time  6    Period  Months    Status  On-going            Plan - 08/29/17 1507    Clinical Impression  Statement  Completed LLE comparative limb volumetrics today.LLE leg from ankle to below knee (A-D) is decreased in volume by 7.83% since commencing CDT on 05/13/2017. Thighs segment (E-G) is reduced in volujme by 16.9%. LLE full  leg volume is decreased by 13.68% overall since initially measuring volumetrics.  Values for thigh and full leg meet and exceed initial     volumetric reduction goal of 10%. Applied compression wraps and reviewed Pt home program for visit interval. Pt is making excellent progress towards goals with only 1 visit per week. Cnot as per POC.    Occupational performance deficits (Please refer to evaluation for details):  ADL's;IADL's;Work;Leisure;Social Participation;Other body image    Rehab Potential  Good    OT Frequency  3x / week    OT Duration  12 weeks    OT Treatment/Interventions  Self-care/ADL training;Therapeutic exercise;Manual Therapy;Manual lymph drainage;Therapeutic activities;DME and/or AE instruction;Compression bandaging;Other (comment);Patient/family education skin care with low ph lotion and / or castor oil to improve skin hydration and flexibility    Clinical Decision Making  Several treatment options, min-mod task modification necessary    Recommended Other Services  fit with knee length, custom, ccl 2-3, flat knit compression stockings, and knee length HOS devices to facilitate improved lymphatic function and decrease fibrosis formation during HOS    Consulted and Agree with Plan of Care  Patient       Patient will benefit from skilled therapeutic intervention in order to improve the following deficits and impairments:  Decreased skin integrity, Decreased knowledge of precautions, Decreased knowledge of use of DME, Impaired flexibility, Decreased balance, Decreased mobility, Obesity, Decreased range of motion, Increased edema, Pain  Visit Diagnosis: Lymphedema, not elsewhere classified    Problem List Patient Active Problem List   Diagnosis Date Noted  .  Other neutropenia ([place]) 07/15/2017  . Endometrial cancer ([place]) 04/10/2017  . 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 Chronic acquired lymphedema 01/08/2017  . Obesity, morbid, BMI 40.0-49.9 ([place]) 11/13/2016  . FIGO stage II endometrial cancer ([place]) 10/29/2016  . Left ventricular hypertrophy 11/27/2014  . Lymphedema of lower extremity 08/23/2014  . Severe aortic stenosis 08/21/2014  . Benign essential HTN 08/21/2014  . Depression, major, recurrent, mild ([place]) 08/21/2014  . Dyslipidemia 08/21/2014  . Cardiac dysrhythmia 08/21/2014  . Dysmetabolic syndrome 53/29/9242  . Symptomatic menopausal or female climacteric states 08/21/2014  . 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 History of shingles 08/21/2014  . Personal history of skin cancer 11/24/2009    Andrey Spearman, MS, OTR/L, [facility] 08/29/17 3:13 PM  [place] MAIN [facility] SERVICES [address] [place], [place], [address] Phone: [phone]   Fax:  [phone]  Name: ALEXZIA KASLER MRN: [id number] Date of Birth: Jan 25, 1951

## 2017-09-05 ENCOUNTER — Ambulatory Visit: Payer: Medicare HMO | Admitting: Occupational Therapy

## 2017-09-05 DIAGNOSIS — I890 Lymphedema, not elsewhere classified: Secondary | ICD-10-CM | POA: Diagnosis not present

## 2017-09-05 NOTE — Therapy (Signed)
[place] MAIN [facility] SERVICES [address] [place], [place], [address] Phone: [phone]   Fax:  [phone]  Occupational Therapy Treatment  Patient Details  Name: Melody Soto MRN: [id number] Date of Birth: Nov 04, 1950 Referring Provider: Steele Sizer, MD   Encounter Date: 09/05/2017  OT End of Session - 09/05/17 1131    Visit Number  32    Number of Visits  36    Date for OT Re-Evaluation  10/30/17    OT Start Time  0915    OT Stop Time  1012    OT Time Calculation (min)  57 min    Activity Tolerance  Patient tolerated treatment well;No increased pain    Behavior During Therapy  WFL for tasks assessed/performed       Past Medical History:  Diagnosis Date  . Aortic stenosis   . Cataract   . Depression   . Endometrial cancer determined by uterine biopsy ([place]) 10/29/2016  . Heart murmur    HX OF  . Hyperlipidemia   . Hypertension    CONTROLLED ON MEDS  . Lymphedema   . Shortness of breath dyspnea     Past Surgical History:  Procedure Laterality Date  . 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 cataract surgery Bilateral 05/24/2014   second eye 06/07/2014  . MELANOMA EXCISION    . OVARIAN CYST REMOVAL     EXPLORATORY LAPAROTOMY  . TONSILLECTOMY      There were no vitals filed for this visit.  Subjective Assessment - 09/05/17 1127    Subjective   Ms. Morocco presents for Occupational Therapy visit 32/36 to address BLE with CDT. Pt is 15 minutes late for a 60 minute appointment. Pt presents with custom compression knee high in place on R leg. Pt reports that [employer] attempted to deliver [place] yesterday and it's de to be delivered today. Pt encouraged to wash and wear new garment  and text therapist with impression of fit and function. We'll order 2nsd garment  if no changes are needed.    Pertinent History  BLE swelling w/ insideous onset in early 40's- no known precipitating event; no known family hx  of limb swelling; Transcatheter aortic valve replacement 02/2017;  Hx depression; orbid obesity; HTN; FIGO stage II Endometrial cancer (HCC); external XRT and brachytherapy complete; hx melanoma excision; urinary incontinence,     Limitations  chronic leg pain and swelling, R>L; limits ability to participate in activities requiring standing and walking > 30 minutes, ; contributes to difficulty reaching feet to inspect skin, perform nail and sking care, and bathe feet and distal legs, LB bathing;,contributes. L Chronic leg swelling limits ability to dress and fit LB clothing and street shoes,. Chronic, progressive BLE lymphedema  contributes to impaired body image and depression.    Special Tests  Lymphedema Life Impact Scale (LLIS) score- baseline = 32, which translates to 47% iperceived impairement due to lymphedema over the past week    Patient Stated Goals  get the swelling and pain in my legs down so I can walk more and wear normal shoes    Currently in Pain?  No/denies    Pain Onset  More than a month ago > 20 years                   OT Treatments/Exercises (OP) - 09/05/17 0001      ADLs   ADL Education Given  Yes      Manual Therapy  Manual Therapy  Edema management;Compression Bandaging;Manual Lymphatic Drainage (MLD)    Edema Management  skin care as established to LLE during MLD    Manual Lymphatic Drainage (MLD)  MLD to LLE utilizing short neck sequence, deep abdominal breathing, functional healthy inguinal LN, and J strokes to leg segments and foot.     Compression Bandaging  LLE gradient compression wrap applied as established from foot to below knee.             OT Education - 09/05/17 1130    Education provided  Yes    Education Details  Continued skilled Pt/caregiver education  And LE ADL training throughout visit for lymphedema self care/ home program, including compression wrapping, compression garment and device wear/care, lymphatic pumping ther ex, simple  self-MLD, and skin care. Discussed progress towards goals.     Person(s) Educated  Patient    Methods  Explanation;Demonstration;Tactile cues;Verbal cues;Handout    Comprehension  Verbalized understanding;Returned demonstration;Verbal cues required;Tactile cues required;Need further instruction    Person(s) Educated  Patient    Methods  Explanation;Demonstration    Comprehension  Verbalized understanding;Returned demonstration          OT Long Term Goals - 08/22/17 1337      OT LONG TERM GOAL #1   Title  Pt modified independent w/ lymphedema precautions/prevention principals and using printed reference to limit LE progression and infection risk.    Baseline  Max A    Time  10    Period  Days    Status  Achieved      OT LONG TERM GOAL #2   Title  Lymphedema (LE) management/ self-care: Pt able to apply knee -length gradient compression wraps with extra time and printed reference PRN (modified independence) within 10 visits to achieve optimal limb volume reduction during Intensive Phase CDT.    Baseline  dependent    Time  10    Period  Days    Status  Achieved      OT LONG TERM GOAL #3   Title  Lymphedema (LE) management/ self-care:  Pt to achieve at least 10% limb volume reductions below knees bilaterally during Intensive Phase CDT to limit LE progression, to improve tissue integrity, to decrease infection risk and to improve functional arm and hand use essential for basic and instrumental ADLs performance.    Baseline  dependent  06/10/2017: Achieved for RLE leg  . Partially cachieved for LLE thigh and overall LVR.      Time  12    Period  Weeks    Status  Partially Met      OT LONG TERM GOAL #4   Title  Lymphedema (LE) management/ self-care:  Pt to tolerate daily compression wraps, compression garments and/ or HOS devices in keeping w/ prescribed wear regime within 1 week of issue date of each to progress and retain clinical and functional gains and to limit LE progression.     Baseline  Max A    Time  12    Period  Weeks    Status  Achieved      OT LONG TERM GOAL #5   Title  Lymphedema (LE) management/ self-care:  During Management Phase CDT Pt to sustain current limb volumes within 5%, and all other clinical gains achieved during  OT for CDT.OT treatment independently (to control limb swelling and associated pain, to  limit LE progression, to decrease infection risk and to limit further functional decline.    Baseline  Max A  Time  6    Period  Months    Status  On-going            Plan - 09/05/17 1131    Clinical Impression Statement  Provided LLE MLD, skin care and compression wraps to below knee today. Pt tolerated manual therapy without difficulty. Pt agrees to text or call therapist with first impression of fit and function of remade custom RLE garm,ent. We'll use remaining 4 visits to complete LLE garment measurement and fitting    Occupational performance deficits (Please refer to evaluation for details):  ADL's;IADL's;Work;Leisure;Social Participation;Other body image    Rehab Potential  Good    OT Frequency  3x / week    OT Duration  12 weeks    OT Treatment/Interventions  Self-care/ADL training;Therapeutic exercise;Manual Therapy;Manual lymph drainage;Therapeutic activities;DME and/or AE instruction;Compression bandaging;Other (comment);Patient/family education skin care with low ph lotion and / or castor oil to improve skin hydration and flexibility    Clinical Decision Making  Several treatment options, min-mod task modification necessary    Recommended Other Services  fit with knee length, custom, ccl 2-3, flat knit compression stockings, and knee length HOS devices to facilitate improved lymphatic function and decrease fibrosis formation during HOS    Consulted and Agree with Plan of Care  Patient       Patient will benefit from skilled therapeutic intervention in order to improve the following deficits and impairments:  Decreased skin  integrity, Decreased knowledge of precautions, Decreased knowledge of use of DME, Impaired flexibility, Decreased balance, Decreased mobility, Obesity, Decreased range of motion, Increased edema, Pain  Visit Diagnosis: Lymphedema, not elsewhere classified    Problem List Patient Active Problem List   Diagnosis Date Noted  . Other neutropenia ([place]) 07/15/2017  . Endometrial cancer ([place]) 04/10/2017  . 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 Chronic acquired lymphedema 01/08/2017  . Obesity, morbid, BMI 40.0-49.9 ([place]) 11/13/2016  . FIGO stage II endometrial cancer ([place]) 10/29/2016  . Left ventricular hypertrophy 11/27/2014  . Lymphedema of lower extremity 08/23/2014  . Severe aortic stenosis 08/21/2014  . Benign essential HTN 08/21/2014  . Depression, major, recurrent, mild ([place]) 08/21/2014  . Dyslipidemia 08/21/2014  . Cardiac dysrhythmia 08/21/2014  . Dysmetabolic syndrome 17/35/6701  . Symptomatic menopausal or female climacteric states 08/21/2014  . 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 History of shingles 08/21/2014  . Personal history of skin cancer 11/24/2009    Andrey Spearman, MS, OTR/L, [facility] 09/05/17 11:34 AM    [place] MAIN [facility] [facility] SERVICES [address] [place], [place], [address] Phone: [phone]   Fax:  [phone]  Name: DAINA CARA MRN: [id number] Date of Birth: 25-Jan-1951

## 2017-09-11 ENCOUNTER — Ambulatory Visit: Payer: Medicare HMO | Admitting: Occupational Therapy

## 2017-09-17 ENCOUNTER — Ambulatory Visit: Payer: Medicare HMO | Attending: Family Medicine | Admitting: Occupational Therapy

## 2017-09-17 DIAGNOSIS — I890 Lymphedema, not elsewhere classified: Secondary | ICD-10-CM

## 2017-09-17 NOTE — Therapy (Signed)
[place] MAIN [facility] SERVICES [address] [place], [place], [address] Phone: [phone]   Fax:  [phone]  Occupational Therapy Treatment  Patient Details  Name: Melody Soto MRN: [id number] Date of Birth: 03-25-1950 Referring Provider: Steele Sizer, MD   Encounter Date: 09/17/2017  OT End of Session - 09/17/17 1553    Visit Number  33    Number of Visits  36    Date for OT Re-Evaluation  10/30/17    OT Start Time  0100    OT Stop Time  0215    OT Time Calculation (min)  75 min    Activity Tolerance  Patient tolerated treatment well;No increased pain    Behavior During Therapy  WFL for tasks assessed/performed       Past Medical History:  Diagnosis Date  . Aortic stenosis   . Cataract   . Depression   . Endometrial cancer determined by uterine biopsy ([place]) 10/29/2016  . Heart murmur    HX OF  . Hyperlipidemia   . Hypertension    CONTROLLED ON MEDS  . Lymphedema   . Shortness of breath dyspnea     Past Surgical History:  Procedure Laterality Date  . 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 cataract surgery Bilateral 05/24/2014   second eye 06/07/2014  . MELANOMA EXCISION    . OVARIAN CYST REMOVAL     EXPLORATORY LAPAROTOMY  . TONSILLECTOMY      There were no vitals filed for this visit.  Subjective Assessment - 09/17/17 1552    Subjective   Ms. Brigandi presents for Occupational Therapy visit 33/36 to address BLE with CDT. Pt presents wearing remade RLE kne high.     Pertinent History  BLE swelling w/ insideous onset in early 40's- no known precipitating event; no known family hx of limb swelling; Transcatheter aortic valve replacement 02/2017;  Hx depression; orbid obesity; HTN; FIGO stage II Endometrial cancer (HCC); external XRT and brachytherapy complete; hx melanoma excision; urinary incontinence,     Limitations  chronic leg pain and swelling, R>L; limits ability to participate in activities requiring  standing and walking > 30 minutes, ; contributes to difficulty reaching feet to inspect skin, perform nail and sking care, and bathe feet and distal legs, LB bathing;,contributes. L Chronic leg swelling limits ability to dress and fit LB clothing and street shoes,. Chronic, progressive BLE lymphedema  contributes to impaired body image and depression.    Special Tests  Lymphedema Life Impact Scale (LLIS) score- baseline = 32, which translates to 47% iperceived impairement due to lymphedema over the past week    Patient Stated Goals  get the swelling and pain in my legs down so I can walk more and wear normal shoes    Currently in Pain?  No/denies    Pain Onset  More than a month ago > 20 years                   OT Treatments/Exercises (OP) - 09/17/17 0001      ADLs   ADL Education Given  Yes      Manual Therapy   Manual Therapy  Edema management    Manual therapy comments  RLE custom compression garment   fitting and assessment. HOS device unavailable for assessment today.    Compression Bandaging  No LLE compression wrap today. Pt wearing original RLknee high today on LLE.  OT Education - 09/17/17 1553    Education provided  Yes    Education Details  Continued skilled Pt/caregiver education  And LE ADL training throughout visit for lymphedema self care/ home program, including compression wrapping, compression garment and device wear/care, lymphatic pumping ther ex, simple self-MLD, and skin care. Discussed progress towards goals.     Person(s) Educated  Patient    Methods  Explanation;Demonstration;Tactile cues;Verbal cues;Handout    Comprehension  Verbalized understanding;Returned demonstration;Verbal cues required;Tactile cues required;Need further instruction          OT Long Term Goals - 08/22/17 1337      OT LONG TERM GOAL #1   Title  Pt modified independent w/ lymphedema precautions/prevention principals and using printed reference to limit LE  progression and infection risk.    Baseline  Max A    Time  10    Period  Days    Status  Achieved      OT LONG TERM GOAL #2   Title  Lymphedema (LE) management/ self-care: Pt able to apply knee -length gradient compression wraps with extra time and printed reference PRN (modified independence) within 10 visits to achieve optimal limb volume reduction during Intensive Phase CDT.    Baseline  dependent    Time  10    Period  Days    Status  Achieved      OT LONG TERM GOAL #3   Title  Lymphedema (LE) management/ self-care:  Pt to achieve at least 10% limb volume reductions below knees bilaterally during Intensive Phase CDT to limit LE progression, to improve tissue integrity, to decrease infection risk and to improve functional arm and hand use essential for basic and instrumental ADLs performance.    Baseline  dependent  06/10/2017: Achieved for RLE leg  . Partially cachieved for LLE thigh and overall LVR.      Time  12    Period  Weeks    Status  Partially Met      OT LONG TERM GOAL #4   Title  Lymphedema (LE) management/ self-care:  Pt to tolerate daily compression wraps, compression garments and/ or HOS devices in keeping w/ prescribed wear regime within 1 week of issue date of each to progress and retain clinical and functional gains and to limit LE progression.    Baseline  Max A    Time  12    Period  Weeks    Status  Achieved      OT LONG TERM GOAL #5   Title  Lymphedema (LE) management/ self-care:  During Management Phase CDT Pt to sustain current limb volumes within 5%, and all other clinical gains achieved during  OT for CDT.OT treatment independently (to control limb swelling and associated pain, to  limit LE progression, to decrease infection risk and to limit further functional decline.    Baseline  Max A    Time  6    Period  Months    Status  On-going            Plan - 09/17/17 1554    Clinical Impression Statement  Remade RLE compression knee high appears to  provide improved fit and containment . Pt encouraged to distribute fabric proximally to achieve correct length. Skin redness noted at Cy whichbappears to be due to bunching up of tricot patch agaibnst the skin. Patch removed by OT  to improve comfort and fit and to limit skin irritation. Pt will email ior call with report of skin  condition aftwter tomorrow before OT calls vendor and orders second          garment.     Occupational performance deficits (Please refer to evaluation for details):  ADL's;IADL's;Work;Leisure;Social Participation;Other body image    Rehab Potential  Good    OT Frequency  3x / week    OT Duration  12 weeks    OT Treatment/Interventions  Self-care/ADL training;Therapeutic exercise;Manual Therapy;Manual lymph drainage;Therapeutic activities;DME and/or AE instruction;Compression bandaging;Other (comment);Patient/family education skin care with low ph lotion and / or castor oil to improve skin hydration and flexibility    Clinical Decision Making  Several treatment options, min-mod task modification necessary    Recommended Other Services  fit with knee length, custom, ccl 2-3, flat knit compression stockings, and knee length HOS devices to facilitate improved lymphatic function and decrease fibrosis formation during HOS    Consulted and Agree with Plan of Care  Patient       Patient will benefit from skilled therapeutic intervention in order to improve the following deficits and impairments:  Decreased skin integrity, Decreased knowledge of precautions, Decreased knowledge of use of DME, Impaired flexibility, Decreased balance, Decreased mobility, Obesity, Decreased range of motion, Increased edema, Pain  Visit Diagnosis: Lymphedema, not elsewhere classified    Problem List Patient Active Problem List   Diagnosis Date Noted  . Other neutropenia ([place]) 07/15/2017  . Endometrial cancer ([place]) 04/10/2017  . 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  Chronic acquired lymphedema 01/08/2017  . Obesity, morbid, BMI 40.0-49.9 ([place]) 11/13/2016  . FIGO stage II endometrial cancer ([place]) 10/29/2016  . Left ventricular hypertrophy 11/27/2014  . Lymphedema of lower extremity 08/23/2014  . Severe aortic stenosis 08/21/2014  . Benign essential HTN 08/21/2014  . Depression, major, recurrent, mild ([place]) 08/21/2014  . Dyslipidemia 08/21/2014  . Cardiac dysrhythmia 08/21/2014  . Dysmetabolic syndrome 84/16/6063  . Symptomatic menopausal or female climacteric states 08/21/2014  . 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 History of shingles 08/21/2014  . Personal history of skin cancer 11/24/2009    Andrey Spearman, MS, OTR/L, [facility] 09/17/17 4:00 PM  [place] MAIN [facility] SERVICES [address] [place], [place], [address] Phone: [phone]   Fax:  [phone]  Name: Melody Soto MRN: [id number] Date of Birth: 1950-08-15

## 2017-09-19 ENCOUNTER — Ambulatory Visit: Payer: Medicare HMO | Admitting: Occupational Therapy

## 2017-09-26 ENCOUNTER — Ambulatory Visit: Payer: Medicare HMO | Admitting: Occupational Therapy

## 2017-09-26 ENCOUNTER — Other Ambulatory Visit: Payer: Self-pay | Admitting: Family Medicine

## 2017-09-26 DIAGNOSIS — I10 Essential (primary) hypertension: Secondary | ICD-10-CM

## 2017-09-26 DIAGNOSIS — I890 Lymphedema, not elsewhere classified: Secondary | ICD-10-CM

## 2017-09-26 NOTE — Therapy (Signed)
[place] MAIN [facility] SERVICES [address] [place], [place], [address] Phone: [phone]   Fax:  [phone]  Occupational Therapy Treatment  Patient Details  Name: Melody Soto MRN: [id number] Date of Birth: October 02, 1950 Referring Provider: Steele Sizer, MD   Encounter Date: 09/26/2017  OT End of Session - 09/26/17 1501    Visit Number  34    Number of Visits  36    Date for OT Re-Evaluation  10/30/17    OT Start Time  1105    OT Stop Time  1215    OT Time Calculation (min)  70 min    Activity Tolerance  Patient tolerated treatment well;No increased pain    Behavior During Therapy  WFL for tasks assessed/performed       Past Medical History:  Diagnosis Date  . Aortic stenosis   . Cataract   . Depression   . Endometrial cancer determined by uterine biopsy ([place]) 10/29/2016  . Heart murmur    HX OF  . Hyperlipidemia   . Hypertension    CONTROLLED ON MEDS  . Lymphedema   . Shortness of breath dyspnea     Past Surgical History:  Procedure Laterality Date  . 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 cataract surgery Bilateral 05/24/2014   second eye 06/07/2014  . MELANOMA EXCISION    . OVARIAN CYST REMOVAL     EXPLORATORY LAPAROTOMY  . TONSILLECTOMY      There were no vitals filed for this visit.  Subjective Assessment - 09/26/17 1458    Subjective   Melody Soto presents for Occupational Therapy visit 34/36 to address BLE with CDT. Pt presents wearing remade RLE knee high. on the R. She is satisfied with remake garment and HOS device for RLE. Pt ready to procede w/ LLE garment fitting. She is pleased with swelling reduction todate.    Pertinent History  BLE swelling w/ insideous onset in early 40's- no known precipitating event; no known family hx of limb swelling; Transcatheter aortic valve replacement 02/2017;  Hx depression; orbid obesity; HTN; FIGO stage II Endometrial cancer (HCC); external XRT and brachytherapy  complete; hx melanoma excision; urinary incontinence,     Limitations  chronic leg pain and swelling, R>L; limits ability to participate in activities requiring standing and walking > 30 minutes, ; contributes to difficulty reaching feet to inspect skin, perform nail and sking care, and bathe feet and distal legs, LB bathing;,contributes. L Chronic leg swelling limits ability to dress and fit LB clothing and street shoes,. Chronic, progressive BLE lymphedema  contributes to impaired body image and depression.    Special Tests  Lymphedema Life Impact Scale (LLIS) score- baseline = 32, which translates to 47% iperceived impairement due to lymphedema over the past week    Patient Stated Goals  get the swelling and pain in my legs down so I can walk more and wear normal shoes    Currently in Pain?  No/denies    Pain Onset  More than a month ago   > 20 years         LYMPHEDEMA/ONCOLOGY QUESTIONNAIRE - 09/26/17 1502      Left Lower Extremity Lymphedema   Other  LLE limb volume below the knee for A-D = 5502.16 ml.    Other  LLE limb volume reduction measures 2% since last measured below the knwee on 08/29/17. Overall LLE below the knee limb volume reduction measures 9.72%. This value is within goal range when rounded  up to 10%.              OT Treatments/Exercises (OP) - 09/26/17 0001      ADLs   ADL Education Given  Yes      Manual Therapy   Manual Therapy  Edema management;Manual Lymphatic Drainage (MLD)    Manual therapy comments  volumetric measurements for LLE below knee to measure recen AND VERALL LIMB VOLUME REDUCTION.    Edema Management  skin care as established to LLE during MLD    Manual Lymphatic Drainage (MLD)  MLD to LLE utilizing short neck sequence, deep abdominal breathing, functional healthy inguinal LN, and J strokes to leg segments and foot.     Compression Bandaging  pT USING EXTRA rle GARMENT ON lle.             OT Education - 09/26/17 1501    Education  provided  Yes    Education Details  Continued skilled Pt/caregiver education  And LE ADL training throughout visit for lymphedema self care/ home program, including compression wrapping, compression garment and device wear/care, lymphatic pumping ther ex, simple self-MLD, and skin care. Discussed progress towards goals.     Person(s) Educated  Patient    Methods  Explanation;Demonstration    Comprehension  Verbalized understanding;Returned demonstration          OT Long Term Goals - 08/22/17 1337      OT LONG TERM GOAL #1   Title  Pt modified independent w/ lymphedema precautions/prevention principals and using printed reference to limit LE progression and infection risk.    Baseline  Max A    Time  10    Period  Days    Status  Achieved      OT LONG TERM GOAL #2   Title  Lymphedema (LE) management/ self-care: Pt able to apply knee -length gradient compression wraps with extra time and printed reference PRN (modified independence) within 10 visits to achieve optimal limb volume reduction during Intensive Phase CDT.    Baseline  dependent    Time  10    Period  Days    Status  Achieved      OT LONG TERM GOAL #3   Title  Lymphedema (LE) management/ self-care:  Pt to achieve at least 10% limb volume reductions below knees bilaterally during Intensive Phase CDT to limit LE progression, to improve tissue integrity, to decrease infection risk and to improve functional arm and hand use essential for basic and instrumental ADLs performance.    Baseline  dependent  06/10/2017: Achieved for RLE leg  . Partially cachieved for LLE thigh and overall LVR.      Time  12    Period  Weeks    Status  Partially Met      OT LONG TERM GOAL #4   Title  Lymphedema (LE) management/ self-care:  Pt to tolerate daily compression wraps, compression garments and/ or HOS devices in keeping w/ prescribed wear regime within 1 week of issue date of each to progress and retain clinical and functional gains and to  limit LE progression.    Baseline  Max A    Time  12    Period  Weeks    Status  Achieved      OT LONG TERM GOAL #5   Title  Lymphedema (LE) management/ self-care:  During Management Phase CDT Pt to sustain current limb volumes within 5%, and all other clinical gains achieved during  OT for CDT.OT treatment  independently (to control limb swelling and associated pain, to  limit LE progression, to decrease infection risk and to limit further functional decline.    Baseline  Max A    Time  6    Period  Months    Status  On-going            Plan - 09/26/17 1505    Clinical Impression Statement  LLE limb volume reduction measures 2% since last measured below the knwee on 08/29/17. Overall LLE below the knee limb volume reduction measures 9.72%. This value is within goal range when rounded up to 10%. Next visit we'll complete anatomical measurements for LLE custom knee high compression stocking and HOS devic.    Occupational performance deficits (Please refer to evaluation for details):  ADL's;IADL's;Work;Leisure;Social Participation;Other   body image   Rehab Potential  Good    OT Frequency  3x / week    OT Duration  12 weeks    OT Treatment/Interventions  Self-care/ADL training;Therapeutic exercise;Manual Therapy;Manual lymph drainage;Therapeutic activities;DME and/or AE instruction;Compression bandaging;Other (comment);Patient/family education   skin care with low ph lotion and / or castor oil to improve skin hydration and flexibility   Clinical Decision Making  Several treatment options, min-mod task modification necessary    Recommended Other Services  fit with knee length, custom, ccl 2-3, flat knit compression stockings, and knee length HOS devices to facilitate improved lymphatic function and decrease fibrosis formation during HOS    Consulted and Agree with Plan of Care  Patient       Patient will benefit from skilled therapeutic intervention in order to improve the following  deficits and impairments:  Decreased skin integrity, Decreased knowledge of precautions, Decreased knowledge of use of DME, Impaired flexibility, Decreased balance, Decreased mobility, Obesity, Decreased range of motion, Increased edema, Pain  Visit Diagnosis: Lymphedema, not elsewhere classified    Problem List Patient Active Problem List   Diagnosis Date Noted  . Other neutropenia ([place]) 07/15/2017  . Endometrial cancer ([place]) 04/10/2017  . 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 Chronic acquired lymphedema 01/08/2017  . Obesity, morbid, BMI 40.0-49.9 ([place]) 11/13/2016  . FIGO stage II endometrial cancer ([place]) 10/29/2016  . Left ventricular hypertrophy 11/27/2014  . Lymphedema of lower extremity 08/23/2014  . Severe aortic stenosis 08/21/2014  . Benign essential HTN 08/21/2014  . Depression, major, recurrent, mild ([place]) 08/21/2014  . Dyslipidemia 08/21/2014  . Cardiac dysrhythmia 08/21/2014  . Dysmetabolic syndrome 88/67/7373  . Symptomatic menopausal or female climacteric states 08/21/2014  . 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 History of shingles 08/21/2014  . Personal history of skin cancer 11/24/2009   Andrey Spearman, MS, OTR/L, [facility] 09/26/17 3:06 PM   [place] MAIN [facility] SERVICES [address] [place], [place], [address] Phone: [phone]   Fax:  [phone]  Name: Melody Soto MRN: [id number] Date of Birth: May 13, 1950

## 2017-10-03 ENCOUNTER — Ambulatory Visit: Payer: Medicare HMO | Admitting: Occupational Therapy

## 2017-10-03 ENCOUNTER — Other Ambulatory Visit: Payer: Self-pay | Admitting: Family Medicine

## 2017-10-03 DIAGNOSIS — I10 Essential (primary) hypertension: Secondary | ICD-10-CM

## 2017-10-03 DIAGNOSIS — I890 Lymphedema, not elsewhere classified: Secondary | ICD-10-CM

## 2017-10-03 NOTE — Therapy (Signed)
[place] MAIN [facility] SERVICES [address] [place], [place], [address] Phone: [phone]   Fax:  [phone]  Occupational Therapy Treatment  Patient Details  Name: Melody Soto MRN: [id number] Date of Birth: 1950-12-16 Referring Provider: Steele Sizer, MD   Encounter Date: 10/03/2017  OT End of Session - 10/03/17 1224    Visit Number  35    Number of Visits  36    Date for OT Re-Evaluation  10/30/17    OT Start Time  1105    OT Stop Time  1200    OT Time Calculation (min)  55 min    Activity Tolerance  Patient tolerated treatment well;No increased pain    Behavior During Therapy  WFL for tasks assessed/performed       Past Medical History:  Diagnosis Date  . Aortic stenosis   . Cataract   . Depression   . Endometrial cancer determined by uterine biopsy ([place]) 10/29/2016  . Heart murmur    HX OF  . Hyperlipidemia   . Hypertension    CONTROLLED ON MEDS  . Lymphedema   . Shortness of breath dyspnea     Past Surgical History:  Procedure Laterality Date  . 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 cataract surgery Bilateral 05/24/2014   second eye 06/07/2014  . MELANOMA EXCISION    . OVARIAN CYST REMOVAL     EXPLORATORY LAPAROTOMY  . TONSILLECTOMY      There were no vitals filed for this visit.  Subjective Assessment - 10/03/17 1223    Subjective   Melody Soto presents for Occupational Therapy visit 35/36 to address BLE with CDT. Pt states she is doing very well and has no new complaints.    Pertinent History  BLE swelling w/ insideous onset in early 40's- no known precipitating event; no known family hx of limb swelling; Transcatheter aortic valve replacement 02/2017;  Hx depression; orbid obesity; HTN; FIGO stage II Endometrial cancer (HCC); external XRT and brachytherapy complete; hx melanoma excision; urinary incontinence,     Limitations  chronic leg pain and swelling, R>L; limits ability to participate in  activities requiring standing and walking > 30 minutes, ; contributes to difficulty reaching feet to inspect skin, perform nail and sking care, and bathe feet and distal legs, LB bathing;,contributes. L Chronic leg swelling limits ability to dress and fit LB clothing and street shoes,. Chronic, progressive BLE lymphedema  contributes to impaired body image and depression.    Special Tests  Lymphedema Life Impact Scale (LLIS) score- baseline = 32, which translates to 47% iperceived impairement due to lymphedema over the past week    Patient Stated Goals  get the swelling and pain in my legs down so I can walk more and wear normal shoes    Currently in Pain?  No/denies    Pain Onset  More than a month ago   > 20 years                  OT Treatments/Exercises (OP) - 10/03/17 0001      ADLs   ADL Education Given  Yes      Manual Therapy   Manual Therapy  Edema management    Manual therapy comments  completed anatomical measurements for LLE custom compression knee high and HOS device ([employer])             OT Education - 10/03/17 1224    Education provided  Yes  Education Details  Continued skilled Pt/caregiver education  And LE ADL training throughout visit for lymphedema self care/ home program, including compression wrapping, compression garment and device wear/care, lymphatic pumping ther ex, simple self-MLD, and skin care. Discussed progress towards goals.     Person(s) Educated  Patient    Methods  Explanation;Demonstration    Comprehension  Verbalized understanding;Returned demonstration          OT Long Term Goals - 08/22/17 1337      OT LONG TERM GOAL #1   Title  Pt modified independent w/ lymphedema precautions/prevention principals and using printed reference to limit LE progression and infection risk.    Baseline  Max A    Time  10    Period  Days    Status  Achieved      OT LONG TERM GOAL #2   Title  Lymphedema (LE) management/ self-care: Pt  able to apply knee -length gradient compression wraps with extra time and printed reference PRN (modified independence) within 10 visits to achieve optimal limb volume reduction during Intensive Phase CDT.    Baseline  dependent    Time  10    Period  Days    Status  Achieved      OT LONG TERM GOAL #3   Title  Lymphedema (LE) management/ self-care:  Pt to achieve at least 10% limb volume reductions below knees bilaterally during Intensive Phase CDT to limit LE progression, to improve tissue integrity, to decrease infection risk and to improve functional arm and hand use essential for basic and instrumental ADLs performance.    Baseline  dependent  06/10/2017: Achieved for RLE leg  . Partially cachieved for LLE thigh and overall LVR.      Time  12    Period  Weeks    Status  Partially Met      OT LONG TERM GOAL #4   Title  Lymphedema (LE) management/ self-care:  Pt to tolerate daily compression wraps, compression garments and/ or HOS devices in keeping w/ prescribed wear regime within 1 week of issue date of each to progress and retain clinical and functional gains and to limit LE progression.    Baseline  Max A    Time  12    Period  Weeks    Status  Achieved      OT LONG TERM GOAL #5   Title  Lymphedema (LE) management/ self-care:  During Management Phase CDT Pt to sustain current limb volumes within 5%, and all other clinical gains achieved during  OT for CDT.OT treatment independently (to control limb swelling and associated pain, to  limit LE progression, to decrease infection risk and to limit further functional decline.    Baseline  Max A    Time  6    Period  Months    Status  On-going            Plan - 10/03/17 1226    Clinical Impression Statement  Comkpleted anatomical measurements for LLE custom knee high compression garment and for HOS device. Cont 1 x weekly                   until   garment fitting is complete to ensure Pt is able to sustain clinical gains.     Occupational performance deficits (Please refer to evaluation for details):  ADL's;IADL's;Work;Leisure;Social Participation;Other   body image   Rehab Potential  Good    OT Frequency  3x / week  OT Duration  12 weeks    OT Treatment/Interventions  Self-care/ADL training;Therapeutic exercise;Manual Therapy;Manual lymph drainage;Therapeutic activities;DME and/or AE instruction;Compression bandaging;Other (comment);Patient/family education   skin care with low ph lotion and / or castor oil to improve skin hydration and flexibility   Clinical Decision Making  Several treatment options, min-mod task modification necessary    Recommended Other Services  fit with knee length, custom, ccl 2-3, flat knit compression stockings, and knee length HOS devices to facilitate improved lymphatic function and decrease fibrosis formation during HOS    Consulted and Agree with Plan of Care  Patient       Patient will benefit from skilled therapeutic intervention in order to improve the following deficits and impairments:  Decreased skin integrity, Decreased knowledge of precautions, Decreased knowledge of use of DME, Impaired flexibility, Decreased balance, Decreased mobility, Obesity, Decreased range of motion, Increased edema, Pain  Visit Diagnosis: Lymphedema, not elsewhere classified    Problem List Patient Active Problem List   Diagnosis Date Noted  . Other neutropenia ([place]) 07/15/2017  . Endometrial cancer ([place]) 04/10/2017  . 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 Chronic acquired lymphedema 01/08/2017  . Obesity, morbid, BMI 40.0-49.9 ([place]) 11/13/2016  . FIGO stage II endometrial cancer ([place]) 10/29/2016  . Left ventricular hypertrophy 11/27/2014  . Lymphedema of lower extremity 08/23/2014  . Severe aortic stenosis 08/21/2014  . Benign essential HTN 08/21/2014  . Depression, major, recurrent, mild ([place]) 08/21/2014  . Dyslipidemia 08/21/2014  . Cardiac dysrhythmia  08/21/2014  . Dysmetabolic syndrome 72/90/2111  . Symptomatic menopausal or female climacteric states 08/21/2014  . 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 History of shingles 08/21/2014  . Personal history of skin cancer 11/24/2009    Andrey Spearman, MS, OTR/L, [facility] 10/03/17 12:27 PM  [place] MAIN [facility] SERVICES [address] [place], [place], [address] Phone: [phone]   Fax:  [phone]  Name: Melody Soto MRN: [id number] Date of Birth: [birth year]

## 2017-10-04 ENCOUNTER — Other Ambulatory Visit: Payer: Self-pay

## 2017-10-04 DIAGNOSIS — I10 Essential (primary) hypertension: Secondary | ICD-10-CM

## 2017-10-04 MED ORDER — HYDROCHLOROTHIAZIDE 12.5 MG PO TABS: 12.5000 mg | ORAL_TABLET | Freq: Every day | ORAL | 0 refills | Status: DC

## 2017-10-04 NOTE — Telephone Encounter (Signed)
Refill request for Hypertension medication:  Hydrochlorothiazide 12.5 mg  Last office visit pertaining to hypertension: 06/24/2017  BP Readings from Last 3 Encounters:  07/15/17 128/60  06/24/17 (!) 144/76  04/10/17 131/82    Lab Results  Component Value Date   CREATININE 0.73 06/24/2017   BUN 22 06/24/2017   NA 140 06/24/2017   K 4.3 06/24/2017   CL 106 06/24/2017   CO2 27 06/24/2017    Follow-ups on file. 11/15/2017

## 2017-10-04 NOTE — Telephone Encounter (Signed)
Hypertension medication request: HCTZ 12.5 mg  Last office visit pertaining to hypertension: 07/15/2017   BP Readings from Last 3 Encounters:  07/15/17 128/60  06/24/17 (!) 144/76  04/10/17 131/82    Lab Results  Component Value Date   CREATININE 0.73 06/24/2017   BUN 22 06/24/2017   NA 140 06/24/2017   K 4.3 06/24/2017   CL 106 06/24/2017   CO2 27 06/24/2017     Follow up on 11/15/2017

## 2017-10-06 ENCOUNTER — Other Ambulatory Visit: Payer: Self-pay | Admitting: Family Medicine

## 2017-10-06 DIAGNOSIS — I10 Essential (primary) hypertension: Secondary | ICD-10-CM

## 2017-10-07 ENCOUNTER — Encounter: Payer: Self-pay | Admitting: Family Medicine

## 2017-10-07 ENCOUNTER — Other Ambulatory Visit: Payer: Self-pay | Admitting: Family Medicine

## 2017-10-07 DIAGNOSIS — I10 Essential (primary) hypertension: Secondary | ICD-10-CM

## 2017-10-09 ENCOUNTER — Encounter: Payer: Self-pay | Admitting: Obstetrics and Gynecology

## 2017-10-09 ENCOUNTER — Inpatient Hospital Stay: Payer: Medicare HMO | Attending: Obstetrics and Gynecology | Admitting: Obstetrics and Gynecology

## 2017-10-09 VITALS — BP 164/77 | HR 66 | Temp 98.3°F | Resp 18 | Ht 68.5 in | Wt 256.7 lb

## 2017-10-09 DIAGNOSIS — R32 Unspecified urinary incontinence: Secondary | ICD-10-CM | POA: Insufficient documentation

## 2017-10-09 DIAGNOSIS — C541 Malignant neoplasm of endometrium: Secondary | ICD-10-CM | POA: Diagnosis not present

## 2017-10-09 DIAGNOSIS — Z923 Personal history of irradiation: Secondary | ICD-10-CM | POA: Diagnosis not present

## 2017-10-09 NOTE — Progress Notes (Signed)
Patient c/o discharge ( pink ) with pain noted right lower abdomen x 1 week/ patient states it has resolved at this time.

## 2017-10-09 NOTE — Progress Notes (Signed)
Gynecologic Oncology Interval Visit   Referring Provider: Will Bonnet,  MD  Chief Concern: Grade 1 endometrial cancer  Subjective:  Melody Soto is a 67 y.o. female G2P2 initially seen in consultation for Dr. 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 She is s/p prior XL ovarian cystectomy for benign disease. She returns to clinic today for surveillance.   04/16/3017 Mammogram negative   05/09/3017 CT chest wo contrast IMPRESSION: 1. Stable small scattered mediastinal and hilar lymph nodes. No mass or adenopathy. No new or progressive findings. 2. Stable small pulmonary nodules. Recommend follow-up noncontrast chest CT in 1 year. 3. Stable mild emphysematous changes.  07/31/3017 CT Abdomen Pelvis W Contrast IMPRESSION: Negative. No evidence of recurrent or metastatic carcinoma within the abdomen or pelvis. Cholelithiasis.  No radiographic evidence of cholecystitis. Mild hepatic steatosis. Colonic diverticulosis, without radiographic evidence of diverticulitis.  Today, she reports feeling well overall. She had episode of right pelvic, supra-pubic pain which resolved spontaneously. She noticed episode of 'pink-tinged' discharge or urine' around the same time which has resolved. She has incontinence of urine, worse with bladder fullness, coughing since completing radiation. She continues to be followed by the Lymphedema clinic for chronic lower extremity lymphedema. She has been purposefully losing weight and estimates ~ 30 lb weight loss by counting calories and increasing activity. She has recovered well post-TAVR and reports improved energy levels. She uses the vaginal dilator sporadically.   Gynecologic Oncology History Melody Soto is a pleasant female G2P2 s/p prior XL ovarian cystectomy for benign disease who is seen in consultation from Dr. Glennon Mac for grade 1 endometrial cancer.  10/17/2016 Endometrial biopsy and biopsy of cervical  polyp  Part A: ENDOMETRIUM, BIOPSY:  WELL DIFFERENTIATED ENDOMETRIAL ADENOCARCINOMA (FIGO I). COMMENT: THIS CASE WAS REVIEWED IN INTRADEPARTMENTAL CONSULTATION AND THE DIAGNOSIS REFLECTS OUR CONSENSUS OPINION.DR,JACKSON'S OFFICE WAS INFORMED OF DIAGNOSIS ON 10/19/16 AT 1.10 PM.  Part B: ENDOCERVIX, POLYP:  MINUTE FRAGMENTS OFOF ADENOCARCINOMA PROBABLY FROM ENDOMETRIUM.  MINUTE FRAGMENTS OF BENIGN ENDOCERVICAL GLANDS, MUCUS, AND BLOOD.  Pap unsatisfactory  10/29/2017 Pelvic ultrasound Uterus 7.3 x 5 x 5 cm with small leiomyomas Bilateral ovaries normal. Endometrial stripe 7.1 mm  CT C/A/P 11/20/2016 IMPRESSION: 1. There is a mildly enlarged right hilar lymph node at 1.1 cm, neck given the that there are no other indicators of metastatic disease, this is likely incidental. No worrisome pulmonary nodules identified. 2. The endometrium is indistinct. Uterine and adnexal contours relatively normal. 3. Asymmetric glandular tissues in the right upper breast compared to the left. Asymmetry is often normal, but mammographic correlation is suggested. 4. Other imaging findings of potential clinical significance: Aortic Atherosclerosis (ICD10-I70.0). Calcified aortic valve with faint calcification of the mitral valve. Cholelithiasis. Small type 1 hiatal hernia. Descending colon diverticulosis.  She was not a candidate for surgical management due to cardiac disease. She underwent primary radiation.   She underwent WPRT (45 Gy over 5 weeks) with Dr. Baruch Gouty at [facility] followed by vaginal cuff brachytherapy with Dr. Christel Mormon at [facility]. She completed high-dose rate Brachytherapy to the cervix (treatment 5 of planned 5) on 04/02/2017.  Problem List: Patient Active Problem List   Diagnosis Date Noted  . Other neutropenia ([place]) 07/15/2017  . Endometrial cancer ([place]) 04/10/2017  . 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 Chronic acquired lymphedema 01/08/2017  . Obesity, morbid, BMI 40.0-49.9  ([place]) 11/13/2016  . FIGO stage II endometrial cancer ([place]) 10/29/2016  . Left ventricular hypertrophy 11/27/2014  .  Lymphedema of lower extremity 08/23/2014  . Severe aortic stenosis 08/21/2014  . Benign essential HTN 08/21/2014  . Depression, major, recurrent, mild ([place]) 08/21/2014  . Dyslipidemia 08/21/2014  . Cardiac dysrhythmia 08/21/2014  . Dysmetabolic syndrome 16/11/9602  . Symptomatic menopausal or female climacteric states 08/21/2014  . 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 History of shingles 08/21/2014  . Personal history of skin cancer 11/24/2009    Past Medical History: Past Medical History:  Diagnosis Date  . Aortic stenosis   . Cataract   . Depression   . Endometrial cancer determined by uterine biopsy ([place]) 10/29/2016  . Heart murmur    HX OF  . Hyperlipidemia   . Hypertension    CONTROLLED ON MEDS  . Lymphedema   . Shortness of breath dyspnea     Past Surgical History: Past Surgical History:  Procedure Laterality Date  . 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 cataract surgery Bilateral 05/24/2014   second eye 06/07/2014  . MELANOMA EXCISION    . OVARIAN CYST REMOVAL     EXPLORATORY LAPAROTOMY  . TONSILLECTOMY      Past Gynecologic History:  Last Menstrual Period:  2016; postmenopausal Last pap: 2018   OB History:  OB History  Gravida Para Term Preterm AB Living  2 2       2   SAB TAB Ectopic Multiple Live Births               # Outcome Date GA Lbr Len/2nd Weight Sex Delivery Anes PTL Lv  2 Para           1 Para             Obstetric Comments  7 pregancies; 2 NSVDs    Family History: Family History  Problem Relation Age of Onset  . Cancer Mother   . Stroke Mother   . Thyroid nodules Mother   . Diabetes Sister   . Hypertension Sister   . Breast cancer Maternal Aunt   . Bone cancer Maternal Uncle   . Depression Daughter   . Depression Daughter     Social History: Social History   Socioeconomic History   . Marital status: Divorced    Spouse name: Not on file  . Number of children: 2  . Years of education: Not on file  . Highest education level: Bachelor's degree (e.g., BA, AB, BS)  Occupational History  . Not on file  Social Needs  . Financial resource strain: Hard  . Food insecurity:    Worry: Never true    Inability: Never true  . Transportation needs:    Medical: No    Non-medical: No  Tobacco Use  . Smoking status: Never Smoker  . Smokeless tobacco: Never Used  Substance and Sexual Activity  . Alcohol use: Yes    Alcohol/week: 0.0 standard drinks    Comment: Minimal alcohol consumption WINE   . Drug use: No  . Sexual activity: Not Currently    Birth control/protection: Post-menopausal  Lifestyle  . Physical activity:    Days per week: 0 days    Minutes per session: 0 min  . Stress: Not at all  Relationships  . Social connections:    Talks on phone: More than three times a week    Gets together: Twice a week    Attends religious service: More than 4 times per year    Active member of club or organization: Yes    Attends meetings of clubs or organizations:  More than 4 times per year    Relationship status: Divorced  . Intimate partner violence:    Fear of current or ex partner: No    Emotionally abused: No    Physically abused: No    Forced sexual activity: No  Other Topics Concern  . Not on file  Social History Narrative  . Not on file    Allergies: Allergies  Allergen Reactions  . Ace Inhibitors Other (See Comments)    UNKNOWN REACTION  . Duloxetine     drowsiness    Current Medications: Current Outpatient Medications  Medication Sig Dispense Refill  . aspirin 81 MG chewable tablet Chew by mouth. PM    . Calcium Carb-Cholecalciferol (CALCIUM 500 + D3) 500-200 MG-UNIT TABS Take 1 capsule by mouth 2 (two) times daily.    . Coenzyme Q10 (COQ10) 50 MG CAPS Take 1 tablet by mouth.     . hydrochlorothiazide (HYDRODIURIL) 12.5 MG tablet Take 1 tablet  (12.5 mg total) by mouth daily. (Patient taking differently: Take 25 mg by mouth daily. ) 90 tablet 0  . magnesium oxide (MAG-OX) 400 MG tablet Take 1 tablet by mouth 2 (two) times daily.    . metoprolol tartrate (LOPRESSOR) 25 MG tablet Take 0.5 tablets (12.5 mg total) by mouth 2 (two) times daily. 180 tablet 1  . MULTIPLE VITAMIN PO Take 2 tablets by mouth 2 (two) times daily. Reported on 05/19/2015    . pravastatin (PRAVACHOL) 20 MG tablet Take 1 tablet (20 mg total) by mouth daily. 90 tablet 0  . vitamin B-12 (CYANOCOBALAMIN) 1000 MCG tablet Take 1,000 mcg by mouth daily.      No current facility-administered medications for this visit.    Review of Systems General:  no complaints Skin: no complaints Eyes: no complaints HEENT: no complaints Breasts: no complaints Pulmonary: no complaints Cardiac: no complaints Extremities: chronic BLE since her 80s Gastrointestinal: no complaints Genitourinary/Sexual: bladder issues since brachytherapy Gyn: lower right abdominal/pelvic pain and pink vaginal discharge.  Musculoskeletal: no complaints Hematology: no complaints Neurologic/Psych: depression   Objective:  Physical Examination:  BP (!) 164/77 (BP Location: Right Arm, Patient Position: Sitting)   Pulse 66   Temp 98.3 F (36.8 C) (Tympanic)   Resp 18   Ht 5' 8.5" (1.74 m)   Wt 256 lb 11.2 oz (116.4 kg)   BMI 38.46 kg/m    ECOG Performance Status: 1 - Symptomatic but completely ambulatory  GENERAL: Patient is a well appearing female in no acute distress HEENT:  Sclerae anicteric.  Oropharynx clear and moist. No ulcerations or evidence of oropharyngeal candidiasis. Neck is supple.  NODES:  No cervical, supraclavicular, or axillary lymphadenopathy palpated.  LUNGS:  Clear to auscultation bilaterally.  No wheezes or rhonchi. HEART:  Regular rate and rhythm. No murmur appreciated. ABDOMEN:  Soft, nontender.  Positive, normoactive bowel sounds. Obese. Vertical incision well healed  w/o hernia, mass, or ascites.  MSK:  No focal spinal tenderness to palpation. Full range of motion bilaterally in the upper extremities. EXTREMITIES:  BLE edema- compression stockings in place. Discoloration of great toes L > R SKIN:  Clear with no obvious rashes or skin changes. No nail dyscrasia. Mild erythema under pannus NEURO:  Nonfocal. Well oriented.  Appropriate affect.  Pelvic: Exam Chaperoned by Nurse:  Vulva: normal appearing vulva without masses, tenderness, or lesions. Vagina: normal vagina. Cervix: normal appearing. Adnexa: unable to palpate. Uterus: not grossly enlarged. Nontender. Exam limited d/t body habitus. Rectal: deferred  Lab Review No  labs performed on site today   Radiologic Imaging: No imaging performed on site today    Assessment:  Melody Soto is a 67 y.o. female diagnosed with possible stage II grade 1 endometrial cancer with involvement of a cervical polyp s/p primary radiation therapy. NED.   Stable small pulmonary nodules on imaging - repeat CT chest 04/2018  Enlarged hilar node - no evidence of progression on imaging.   Asymmetric breast tissue - negative mammogram.   S/P TAVR (transcatheter aortic valve replacement)   Medical co-morbidities complicating care: left ventricular hypertrophy, severe aortic stenosis, cardiac dysrhythmia, hyperlipidemia,, HTN and obesity.    Plan:   Problem List Items Addressed This Visit      Genitourinary   Endometrial cancer [facility]) - Primary   Relevant Orders   Ambulatory referral to Urology    Other Visit Diagnoses    Urinary incontinence, unspecified type       Relevant Orders   Ambulatory referral to Urology        Ms. Belongia has possible stage II endometrial cancer, grade 1 endometrioid, with cervical involvement. She has several significant comorbidities and was treated with primary radiation therapy. NED on exam.   I have recommended continued close follow up with exams, including pelvic exams  every 3-6 months for 2-3 years, then every 6-12 months for 3-5 years and then annually thereafter.  We can start to alternate with Dr. Glennon Mac.   We reviewed patient education for obesity, lifestyle, exercise, nutrition, sexual health, vaginal lubricants. I encouraged her to use the vaginal dilators at least once a week. She is a nonsmoker. We congratulated her on her weight loss and discussed need for good nutrition, and exercise.  I will provide information regarding the CARE program.    A total of 25  minutes were spent with the patient/family today; >50% was spent in education, counseling and coordination of care for endometrial cancer.     Gillis Ends, MD    CC:  Will Bonnet,  MD

## 2017-10-09 NOTE — Patient Instructions (Addendum)
Mrs. Teagyn, [place] like for you to make an appointment with Dr. Glennon Mac in 3 months (approximately 01/09/18) and we will plan to see you back at [facility] in 6 months. You can contact Dr. Marisue Brooklyn office at: [phone].   I have sent a referral to Dr. Erlene Quan at [facility] to evaluate your urinary symptoms. It was a pleasure meeting you today and thank you for allowing me to participate in your care. Beckey Rutter, NP and Dr. Santiago Glad    Recommended continued close follow up with exams, including pelvic exams every 3-6 months for 2-3 years, then every 6-12 months for 3-5 years and then annually thereafter.

## 2017-10-10 ENCOUNTER — Ambulatory Visit: Payer: Medicare HMO | Admitting: Occupational Therapy

## 2017-10-10 DIAGNOSIS — I890 Lymphedema, not elsewhere classified: Secondary | ICD-10-CM

## 2017-10-10 NOTE — Therapy (Signed)
[place] MAIN [facility] SERVICES [address] [place], [place], [address] Phone: [phone]   Fax:  [phone]  Occupational Therapy Treatment  Patient Details  Name: Melody Soto MRN: [id number] Date of Birth: Sep 20, 1950 Referring Provider: Steele Sizer, MD   Encounter Date: 10/10/2017  OT End of Session - 10/10/17 1447    Visit Number  36    Number of Visits  36    Date for OT Re-Evaluation  11/21/17    OT Start Time  1120    OT Stop Time  1200    OT Time Calculation (min)  40 min    Activity Tolerance  Patient tolerated treatment well;No increased pain    Behavior During Therapy  WFL for tasks assessed/performed       Past Medical History:  Diagnosis Date  . Aortic stenosis   . Cataract   . Depression   . Endometrial cancer determined by uterine biopsy ([place]) 10/29/2016  . Heart murmur    HX OF  . Hyperlipidemia   . Hypertension    CONTROLLED ON MEDS  . Lymphedema   . Shortness of breath dyspnea     Past Surgical History:  Procedure Laterality Date  . 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 cataract surgery Bilateral 05/24/2014   second eye 06/07/2014  . MELANOMA EXCISION    . OVARIAN CYST REMOVAL     EXPLORATORY LAPAROTOMY  . TONSILLECTOMY      There were no vitals filed for this visit.  Subjective Assessment - 10/10/17 1448    Subjective   [name] presents for Occupational Therapy visit 36/36 to address BLE with CDT. Pt brings remake of RLE custom compression garment to clinic for fitting and assessment. Sjhe reports fit is excellent now and garment is very comfortable. OT notified DME vendor to go ahead and order 2nd RLE garment. LLE garment is already in the works.    Pertinent History  BLE swelling w/ insideous onset in early 40's- no known precipitating event; no known family hx of limb swelling; Transcatheter aortic valve replacement 02/2017;  Hx depression; orbid obesity; HTN; FIGO stage II  Endometrial cancer (HCC); external XRT and brachytherapy complete; hx melanoma excision; urinary incontinence,     Limitations  chronic leg pain and swelling, R>L; limits ability to participate in activities requiring standing and walking > 30 minutes, ; contributes to difficulty reaching feet to inspect skin, perform nail and sking care, and bathe feet and distal legs, LB bathing;,contributes. L Chronic leg swelling limits ability to dress and fit LB clothing and street shoes,. Chronic, progressive BLE lymphedema  contributes to impaired body image and depression.    Special Tests  Lymphedema Life Impact Scale (LLIS) score- baseline = 32, which translates to 47% iperceived impairement due to lymphedema over the past week    Patient Stated Goals  get the swelling and pain in my legs down so I can walk more and wear normal shoes    Currently in Pain?  No/denies    Pain Onset  More than a month ago   > 20 years                  OT Treatments/Exercises (OP) - 10/10/17 0001      ADLs   ADL Education Given  Yes      Manual Therapy   Manual Therapy  Edema management;Manual Lymphatic Drainage (MLD)    Edema Management  fitting and assessment of REMADE RKLLE custom  compression garment.    Manual Lymphatic Drainage (MLD)  MLD to LLE utilizing short neck sequence, deep abdominal breathing, functional healthy inguinal LN, and J strokes to leg segments and foot.     Compression Bandaging  custom compression garments after MLD. Compression wrapping id completed and DC             OT Education - 10/10/17 1451    Education provided  Yes    Education Details  Continued skilled Pt/caregiver education  And LE ADL training throughout visit for lymphedema self care/ home program, including compression wrapping, compression garment and device wear/care, lymphatic pumping ther ex, simple self-MLD, and skin care. Discussed progress towards goals.     Person(s) Educated  Patient    Methods   Explanation;Demonstration    Comprehension  Verbalized understanding;Returned demonstration          OT Long Term Goals - 10/10/17 1452      OT LONG TERM GOAL #1   Title  Pt modified independent w/ lymphedema precautions/prevention principals and using printed reference to limit LE progression and infection risk.    Baseline  Max A    Time  10    Period  Days    Status  Achieved      OT LONG TERM GOAL #2   Title  Lymphedema (LE) management/ self-care: Pt able to apply knee -length gradient compression wraps with extra time and printed reference PRN (modified independence) within 10 visits to achieve optimal limb volume reduction during Intensive Phase CDT.    Baseline  dependent    Time  10    Period  Days    Status  Achieved      OT LONG TERM GOAL #3   Title  Lymphedema (LE) management/ self-care:  Pt to achieve at least 10% limb volume reductions below knees bilaterally during Intensive Phase CDT to limit LE progression, to improve tissue integrity, to decrease infection risk and to improve functional arm and hand use essential for basic and instrumental ADLs performance.    Baseline  dependent   GOAL MET FOR  LLE : LLE decreased in volume by 7.83% beloe the knee, by 16.92% in thigh, and by 13.68% overall.  RLE GOAL PARTIALLY MET: RLE decrweased by 6.3% below the knee, by 5,.80% IN THIGH, AND BY 5.98% OVERALL.     Time  12    Period  Weeks    Status  Partially Met      OT LONG TERM GOAL #4   Title  Lymphedema (LE) management/ self-care:  Pt to tolerate daily compression wraps, compression garments and/ or HOS devices in keeping w/ prescribed wear regime within 1 week of issue date of each to progress and retain clinical and functional gains and to limit LE progression.    Baseline  Max A    Time  12    Period  Weeks    Status  Achieved      OT LONG TERM GOAL #5   Title  Lymphedema (LE) management/ self-care:  During Management Phase CDT Pt to sustain current limb volumes  within 5%, and all other clinical gains achieved during  OT for CDT.OT treatment independently (to control limb swelling and associated pain, to  limit LE progression, to decrease infection risk and to limit further functional decline.    Baseline  Max A    Time  6    Period  Months    Status  On-going  Long Term Additional Goals   Additional Long Term Goals  Yes            Plan - 10/10/17 1502    Clinical Impression Statement  Fitted and assessed fit and function of RLE custom knee length compression garment REMAKE. Garment fit is much improved and swelling appears well controlled in snufgger garment. Pt reports comfort is also improved over initial garment. She is able to don and doff garment without difficulty using assistive devices and correct techniques. Pt is awaiting delivery of LLE compression garment. It has been ordered from DME vendor. Pt tolerated MLD, skin care and compression therapy in clinic  without difficulty today. Provided Pt edu  re LE self care and reviewed progress towards   goalss. Please review LTG section for details. Pt will benefit from OT 1 x weekly  for 4-6 weeks until LLE custom compression garemtn ifitting is complete. to ensure retention of clinical gains   as she transitions to Management Phase CDT on LLE. Recertification completed his date.    Occupational performance deficits (Please refer to evaluation for details):  ADL's;IADL's;Work;Leisure;Social Participation;Other   body image   Rehab Potential  Good    OT Frequency  1x / week    OT Duration  6 weeks    OT Treatment/Interventions  Self-care/ADL training;Therapeutic exercise;Manual Therapy;Manual lymph drainage;Therapeutic activities;DME and/or AE instruction;Compression bandaging;Other (comment);Patient/family education   skin care with low ph lotion and / or castor oil to improve skin hydration and flexibility   Plan  Cont OT for CDT to LLE 1 x weekly until LLE custom compression garment is fit  and assessment is finalized. Schedule 1 and 3 month follow ups to assess progress towards long term self-management goal and to assist w/ custom garment replacement PRN    Clinical Decision Making  Several treatment options, min-mod task modification necessary    Recommended Other Services  fit with knee length, custom, ccl 2-3, flat knit compression stockings, and knee length HOS devices to facilitate improved lymphatic function and decrease fibrosis formation during HOS    Consulted and Agree with Plan of Care  Patient       Patient will benefit from skilled therapeutic intervention in order to improve the following deficits and impairments:  Decreased skin integrity, Decreased knowledge of precautions, Decreased knowledge of use of DME, Impaired flexibility, Decreased balance, Decreased mobility, Obesity, Decreased range of motion, Increased edema, Pain  Visit Diagnosis: Lymphedema, not elsewhere classified - Plan: Ot plan of care cert/re-cert    Problem List Patient Active Problem List   Diagnosis Date Noted  . Other neutropenia ([place]) 07/15/2017  . Endometrial cancer ([place]) 04/10/2017  . 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 Chronic acquired lymphedema 01/08/2017  . Obesity, morbid, BMI 40.0-49.9 ([place]) 11/13/2016  . FIGO stage II endometrial cancer ([place]) 10/29/2016  . Left ventricular hypertrophy 11/27/2014  . Lymphedema of lower extremity 08/23/2014  . Severe aortic stenosis 08/21/2014  . Benign essential HTN 08/21/2014  . Depression, major, recurrent, mild ([place]) 08/21/2014  . Dyslipidemia 08/21/2014  . Cardiac dysrhythmia 08/21/2014  . Dysmetabolic syndrome 42/68/3419  . Symptomatic menopausal or female climacteric states 08/21/2014  . 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 History of shingles 08/21/2014  . Personal history of skin cancer 11/24/2009   Andrey Spearman, MS, OTR/L, [facility] 10/10/17 4:49 PM'  [place] MAIN [facility] SERVICES [address] [place], [place], [address] Phone: [phone]   Fax:  [phone]  Name: Melody Soto MRN: [id number] Date of Birth: Feb 02, 1951

## 2017-10-17 ENCOUNTER — Ambulatory Visit: Payer: Medicare HMO | Attending: Family Medicine | Admitting: Occupational Therapy

## 2017-10-17 DIAGNOSIS — I890 Lymphedema, not elsewhere classified: Secondary | ICD-10-CM | POA: Insufficient documentation

## 2017-10-17 NOTE — Therapy (Signed)
[place] MAIN [facility] SERVICES [address] [place], [place], [address] Phone: [phone]   Fax:  [phone]  Occupational Therapy Treatment  Patient Details  Name: Melody Soto MRN: [id number] Date of Birth: 04/29/1950 Referring Provider: Steele Sizer, MD   Encounter Date: 10/17/2017  OT End of Session - 10/17/17 1351    Visit Number  1    Number of Visits  12    Date for OT Re-Evaluation  11/21/17    OT Start Time  1120    OT Stop Time  1210    OT Time Calculation (min)  50 min    Activity Tolerance  Patient tolerated treatment well;No increased pain    Behavior During Therapy  WFL for tasks assessed/performed       Past Medical History:  Diagnosis Date  . Aortic stenosis   . Cataract   . Depression   . Endometrial cancer determined by uterine biopsy ([place]) 10/29/2016  . Heart murmur    HX OF  . Hyperlipidemia   . Hypertension    CONTROLLED ON MEDS  . Lymphedema   . Shortness of breath dyspnea     Past Surgical History:  Procedure Laterality Date  . 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 cataract surgery Bilateral 05/24/2014   second eye 06/07/2014  . MELANOMA EXCISION    . OVARIAN CYST REMOVAL     EXPLORATORY LAPAROTOMY  . TONSILLECTOMY      There were no vitals filed for this visit.  Subjective Assessment - 10/17/17 1351    Subjective   Melody Soto presents for Occupational Therapy visit 1/12 to address BLE with CDT. Pt brings remade Relax HOS device    to clinic for fitting . She states it fits so much better!    Pertinent History  BLE swelling w/ insideous onset in early 40's- no known precipitating event; no known family hx of limb swelling; Transcatheter aortic valve replacement 02/2017;  Hx depression; orbid obesity; HTN; FIGO stage II Endometrial cancer (HCC); external XRT and brachytherapy complete; hx melanoma excision; urinary incontinence,     Limitations  chronic leg pain and swelling, R>L;  limits ability to participate in activities requiring standing and walking > 30 minutes, ; contributes to difficulty reaching feet to inspect skin, perform nail and sking care, and bathe feet and distal legs, LB bathing;,contributes. L Chronic leg swelling limits ability to dress and fit LB clothing and street shoes,. Chronic, progressive BLE lymphedema  contributes to impaired body image and depression.    Special Tests  Lymphedema Life Impact Scale (LLIS) score- baseline = 32, which translates to 47% iperceived impairement due to lymphedema over the past week    Patient Stated Goals  get the swelling and pain in my legs down so I can walk more and wear normal shoes    Currently in Pain?  No/denies    Pain Onset  More than a month ago   > 20 years                          OT Education - 10/17/17 1354    Education provided  Yes    Education Details  Continued skilled Pt/caregiver education  And LE ADL training throughout visit for lymphedema self care/ home program, including compression wrapping, compression garment and device wear/care, lymphatic pumping ther ex, simple self-MLD, and skin care. Discussed progress towards goals.     Person(s) Educated  Patient  Methods  Explanation;Demonstration    Comprehension  Verbalized understanding;Returned demonstration          OT Long Term Goals - 10/10/17 1452      OT LONG TERM GOAL #1   Title  Pt modified independent w/ lymphedema precautions/prevention principals and using printed reference to limit LE progression and infection risk.    Baseline  Max A    Time  10    Period  Days    Status  Achieved      OT LONG TERM GOAL #2   Title  Lymphedema (LE) management/ self-care: Pt able to apply knee -length gradient compression wraps with extra time and printed reference PRN (modified independence) within 10 visits to achieve optimal limb volume reduction during Intensive Phase CDT.    Baseline  dependent    Time  10     Period  Days    Status  Achieved      OT LONG TERM GOAL #3   Title  Lymphedema (LE) management/ self-care:  Pt to achieve at least 10% limb volume reductions below knees bilaterally during Intensive Phase CDT to limit LE progression, to improve tissue integrity, to decrease infection risk and to improve functional arm and hand use essential for basic and instrumental ADLs performance.    Baseline  dependent   GOAL MET FOR  LLE : LLE decreased in volume by 7.83% beloe the knee, by 16.92% in thigh, and by 13.68% overall.  RLE GOAL PARTIALLY MET: RLE decrweased by 6.3% below the knee, by 5,.80% IN THIGH, AND BY 5.98% OVERALL.     Time  12    Period  Weeks    Status  Partially Met      OT LONG TERM GOAL #4   Title  Lymphedema (LE) management/ self-care:  Pt to tolerate daily compression wraps, compression garments and/ or HOS devices in keeping w/ prescribed wear regime within 1 week of issue date of each to progress and retain clinical and functional gains and to limit LE progression.    Baseline  Max A    Time  12    Period  Weeks    Status  Achieved      OT LONG TERM GOAL #5   Title  Lymphedema (LE) management/ self-care:  During Management Phase CDT Pt to sustain current limb volumes within 5%, and all other clinical gains achieved during  OT for CDT.OT treatment independently (to control limb swelling and associated pain, to  limit LE progression, to decrease infection risk and to limit further functional decline.    Baseline  Max A    Time  6    Period  Months    Status  On-going      Long Term Additional Goals   Additional Long Term Goals  Yes            Plan - 10/17/17 1354    Clinical Impression Statement  RLE HOS device fitting complete. Garment fits and functions appropriately. Provided LLE MLDand skin care. Applied garment after session w/ Max A due to time constraints. Pt  still awaiting delivery of final LLE garment and HOS device. Cont 1 x weekly until fitting completed  to assist w/ retention of vclinicval gains.    Occupational performance deficits (Please refer to evaluation for details):  ADL's;IADL's;Work;Leisure;Social Participation;Other   body image   Rehab Potential  Good    OT Frequency  1x / week    OT Duration  6 weeks  OT Treatment/Interventions  Self-care/ADL training;Therapeutic exercise;Manual Therapy;Manual lymph drainage;Therapeutic activities;DME and/or AE instruction;Compression bandaging;Other (comment);Patient/family education   skin care with low ph lotion and / or castor oil to improve skin hydration and flexibility   Plan  Cont OT for CDT to LLE 1 x weekly until LLE custom compression garment is fit and assessment is finalized. Schedule 1 and 3 month follow ups to assess progress towards long term self-management goal and to assist w/ custom garment replacement PRN    Clinical Decision Making  Several treatment options, min-mod task modification necessary    Recommended Other Services  fit with knee length, custom, ccl 2-3, flat knit compression stockings, and knee length HOS devices to facilitate improved lymphatic function and decrease fibrosis formation during HOS    Consulted and Agree with Plan of Care  Patient       Patient will benefit from skilled therapeutic intervention in order to improve the following deficits and impairments:  Decreased skin integrity, Decreased knowledge of precautions, Decreased knowledge of use of DME, Impaired flexibility, Decreased balance, Decreased mobility, Obesity, Decreased range of motion, Increased edema, Pain  Visit Diagnosis: Lymphedema, not elsewhere classified    Problem List Patient Active Problem List   Diagnosis Date Noted  . Other neutropenia ([place]) 07/15/2017  . Endometrial cancer ([place]) 04/10/2017  . 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 Chronic acquired lymphedema 01/08/2017  . Obesity, morbid, BMI 40.0-49.9 ([place]) 11/13/2016  . FIGO stage II  endometrial cancer ([place]) 10/29/2016  . Left ventricular hypertrophy 11/27/2014  . Lymphedema of lower extremity 08/23/2014  . Severe aortic stenosis 08/21/2014  . Benign essential HTN 08/21/2014  . Depression, major, recurrent, mild ([place]) 08/21/2014  . Dyslipidemia 08/21/2014  . Cardiac dysrhythmia 08/21/2014  . Dysmetabolic syndrome 20/72/1828  . Symptomatic menopausal or female climacteric states 08/21/2014  . 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 History of shingles 08/21/2014  . Personal history of skin cancer 11/24/2009    Andrey Spearman, MS, OTR/L, [facility] 10/17/17 1:57 PM   [place] MAIN [facility] SERVICES [address] [place], [place], [address] Phone: [phone]   Fax:  [phone]  Name: Melody Soto MRN: [id number] Date of Birth: 1950/10/13

## 2017-10-24 ENCOUNTER — Ambulatory Visit: Payer: Medicare HMO | Admitting: Occupational Therapy

## 2017-10-24 DIAGNOSIS — I890 Lymphedema, not elsewhere classified: Secondary | ICD-10-CM | POA: Diagnosis not present

## 2017-10-24 NOTE — Therapy (Signed)
[place] MAIN [facility] SERVICES [address] [place], [place], [address] Phone: [phone]   Fax:  [phone]  Occupational Therapy Treatment  Patient Details  Name: Melody Soto MRN: [id number] Date of Birth: [birth year] Referring Provider: Steele Sizer, MD   Encounter Date: 10/24/2017  OT End of Session - 10/24/17 1425    Visit Number  2    Number of Visits  12    Date for OT Re-Evaluation  11/21/17    OT Start Time  0100    OT Stop Time  0215    OT Time Calculation (min)  75 min    Activity Tolerance  Patient tolerated treatment well;No increased pain    Behavior During Therapy  WFL for tasks assessed/performed       Past Medical History:  Diagnosis Date  . Aortic stenosis   . Cataract   . Depression   . Endometrial cancer determined by uterine biopsy ([place]) 10/29/2016  . Heart murmur    HX OF  . Hyperlipidemia   . Hypertension    CONTROLLED ON MEDS  . Lymphedema   . Shortness of breath dyspnea     Past Surgical History:  Procedure Laterality Date  . 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 cataract surgery Bilateral 05/24/2014   second eye 06/07/2014  . MELANOMA EXCISION    . OVARIAN CYST REMOVAL     EXPLORATORY LAPAROTOMY  . TONSILLECTOMY      There were no vitals filed for this visit.  Subjective Assessment - 10/24/17 1423    Subjective   Ms. Packham presents for Occupational Therapy visit 2/12 to address BLE with CDT. Pt  reports LLE remake compression garment arrived the day after her last visit, and it fits and contains lymphedema very well.    Pertinent History  BLE swelling w/ insideous onset in early 40's- no known precipitating event; no known family hx of limb swelling; Transcatheter aortic valve replacement 02/2017;  Hx depression; orbid obesity; HTN; FIGO stage II Endometrial cancer (HCC); external XRT and brachytherapy complete; hx melanoma excision; urinary incontinence,     Limitations   chronic leg pain and swelling, R>L; limits ability to participate in activities requiring standing and walking > 30 minutes, ; contributes to difficulty reaching feet to inspect skin, perform nail and sking care, and bathe feet and distal legs, LB bathing;,contributes. L Chronic leg swelling limits ability to dress and fit LB clothing and street shoes,. Chronic, progressive BLE lymphedema  contributes to impaired body image and depression.    Special Tests  Lymphedema Life Impact Scale (LLIS) score- baseline = 32, which translates to 47% iperceived impairement due to lymphedema over the past week    Patient Stated Goals  get the swelling and pain in my legs down so I can walk more and wear normal shoes    Pain Onset  More than a month ago   > 20 years                  OT Treatments/Exercises (OP) - 10/24/17 0001      ADLs   ADL Education Given  Yes      Manual Therapy   Manual Therapy  Edema management;Manual Lymphatic Drainage (MLD)    Manual Lymphatic Drainage (MLD)  MLD to LLE utilizing short neck sequence, deep abdominal breathing, functional healthy inguinal LN, and J strokes to leg segments and foot.     Compression Bandaging  LLE compression garment applied w Max  A 2/2 time constraints             OT Education - 10/24/17 1425    Education provided  Yes    Education Details  Continued skilled Pt/caregiver education  And LE ADL training throughout visit for lymphedema self care/ home program, including compression wrapping, compression garment and device wear/care, lymphatic pumping ther ex, simple self-MLD, and skin care. Discussed progress towards goals.     Person(s) Educated  Patient    Methods  Explanation;Demonstration    Comprehension  Verbalized understanding;Returned demonstration          OT Long Term Goals - 10/10/17 1452      OT LONG TERM GOAL #1   Title  Pt modified independent w/ lymphedema precautions/prevention principals and using printed  reference to limit LE progression and infection risk.    Baseline  Max A    Time  10    Period  Days    Status  Achieved      OT LONG TERM GOAL #2   Title  Lymphedema (LE) management/ self-care: Pt able to apply knee -length gradient compression wraps with extra time and printed reference PRN (modified independence) within 10 visits to achieve optimal limb volume reduction during Intensive Phase CDT.    Baseline  dependent    Time  10    Period  Days    Status  Achieved      OT LONG TERM GOAL #3   Title  Lymphedema (LE) management/ self-care:  Pt to achieve at least 10% limb volume reductions below knees bilaterally during Intensive Phase CDT to limit LE progression, to improve tissue integrity, to decrease infection risk and to improve functional arm and hand use essential for basic and instrumental ADLs performance.    Baseline  dependent   GOAL MET FOR  LLE : LLE decreased in volume by 7.83% beloe the knee, by 16.92% in thigh, and by 13.68% overall.  RLE GOAL PARTIALLY MET: RLE decrweased by 6.3% below the knee, by 5,.80% IN THIGH, AND BY 5.98% OVERALL.     Time  12    Period  Weeks    Status  Partially Met      OT LONG TERM GOAL #4   Title  Lymphedema (LE) management/ self-care:  Pt to tolerate daily compression wraps, compression garments and/ or HOS devices in keeping w/ prescribed wear regime within 1 week of issue date of each to progress and retain clinical and functional gains and to limit LE progression.    Baseline  Max A    Time  12    Period  Weeks    Status  Achieved      OT LONG TERM GOAL #5   Title  Lymphedema (LE) management/ self-care:  During Management Phase CDT Pt to sustain current limb volumes within 5%, and all other clinical gains achieved during  OT for CDT.OT treatment independently (to control limb swelling and associated pain, to  limit LE progression, to decrease infection risk and to limit further functional decline.    Baseline  Max A    Time  6     Period  Months    Status  On-going      Long Term Additional Goals   Additional Long Term Goals  Yes            Plan - 10/24/17 1425    Clinical Impression Statement  Custom compression garment and device fitting complete. Pt received 2/2 LLE remake  garment during visit interval. Pt reports garment fits and functions very well. Pt is using garments and devices daily w/ 100% compliance as instructed. Provided LLE mld  and skin care today, Pt edu for LE self care and  assisted with donning garment after manual therapy. Pt tolerated all without difficulty. Discuss transition to Self Management Phase of CDT next  visit.     Occupational performance deficits (Please refer to evaluation for details):  ADL's;IADL's;Work;Leisure;Social Participation;Other   body image   Rehab Potential  Good    OT Frequency  1x / week    OT Duration  6 weeks    OT Treatment/Interventions  Self-care/ADL training;Therapeutic exercise;Manual Therapy;Manual lymph drainage;Therapeutic activities;DME and/or AE instruction;Compression bandaging;Other (comment);Patient/family education   skin care with low ph lotion and / or castor oil to improve skin hydration and flexibility   Plan  Cont OT for CDT to LLE 1 x weekly until LLE custom compression garment is fit and assessment is finalized. Schedule 1 and 3 month follow ups to assess progress towards long term self-management goal and to assist w/ custom garment replacement PRN    Clinical Decision Making  Several treatment options, min-mod task modification necessary    Recommended Other Services  fit with knee length, custom, ccl 2-3, flat knit compression stockings, and knee length HOS devices to facilitate improved lymphatic function and decrease fibrosis formation during HOS    Consulted and Agree with Plan of Care  Patient       Patient will benefit from skilled therapeutic intervention in order to improve the following deficits and impairments:  Decreased skin  integrity, Decreased knowledge of precautions, Decreased knowledge of use of DME, Impaired flexibility, Decreased balance, Decreased mobility, Obesity, Decreased range of motion, Increased edema, Pain  Visit Diagnosis: Lymphedema, not elsewhere classified    Problem List Patient Active Problem List   Diagnosis Date Noted  . Other neutropenia ([place]) 07/15/2017  . Endometrial cancer ([place]) 04/10/2017  . 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 Chronic acquired lymphedema 01/08/2017  . Obesity, morbid, BMI 40.0-49.9 ([place]) 11/13/2016  . FIGO stage II endometrial cancer ([place]) 10/29/2016  . Left ventricular hypertrophy 11/27/2014  . Lymphedema of lower extremity 08/23/2014  . Severe aortic stenosis 08/21/2014  . Benign essential HTN 08/21/2014  . Depression, major, recurrent, mild ([place]) 08/21/2014  . Dyslipidemia 08/21/2014  . Cardiac dysrhythmia 08/21/2014  . Dysmetabolic syndrome 60/05/5995  . Symptomatic menopausal or female climacteric states 08/21/2014  . 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 History of shingles 08/21/2014  . Personal history of skin cancer 11/24/2009    Andrey Spearman, MS, OTR/L, [facility] 10/24/17 2:29 PM  [place] MAIN [facility] SERVICES [address] [place], [place], [address] Phone: [phone]   Fax:  [phone]  Name: LARAINE SAMET MRN: [id number] Date of Birth: 1950-08-10

## 2017-10-25 ENCOUNTER — Ambulatory Visit: Payer: Medicare HMO | Admitting: Family Medicine

## 2017-10-31 ENCOUNTER — Ambulatory Visit: Payer: Medicare HMO | Admitting: Occupational Therapy

## 2017-10-31 DIAGNOSIS — I890 Lymphedema, not elsewhere classified: Secondary | ICD-10-CM | POA: Diagnosis not present

## 2017-10-31 NOTE — Therapy (Signed)
[facility] MAIN REHAB SERVICES [address] [place], [place], [address] Phone: [phone]   Fax:  [phone]  Occupational Therapy Treatment  Patient Details  Name: Melody Soto MRN: [id number] Date of Birth: 03/22/1950 Referring Provider: Krichna Sowles, MD   Encounter Date: 10/31/2017  OT End of Session - 10/31/17 1212    Visit Number  3    Number of Visits  12    Date for OT Re-Evaluation  11/21/17    OT Start Time  1011    OT Stop Time  1115    OT Time Calculation (min)  64 min    Activity Tolerance  Patient tolerated treatment well;No increased pain    Behavior During Therapy  WFL for tasks assessed/performed       Past Medical History:  Diagnosis Date  . Aortic stenosis   . Cataract   . Depression   . Endometrial cancer determined by uterine biopsy (HCC) 10/29/2016  . Heart murmur    HX OF  . Hyperlipidemia   . Hypertension    CONTROLLED ON MEDS  . Lymphedema   . Shortness of breath dyspnea     Past Surgical History:  Procedure Laterality Date  . 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 cataract surgery Bilateral 05/24/2014   second eye 06/07/2014  . MELANOMA EXCISION    . OVARIAN CYST REMOVAL     EXPLORATORY LAPAROTOMY  . TONSILLECTOMY      There were no vitals filed for this visit.  Subjective Assessment - 10/31/17 1210    Subjective   Melody Soto presents for Occupational Therapy visit 3/12 to address BLE with CDT. Pt reports she has not been eating as healthy lately and may have regained some weight .    Pertinent History  BLE swelling w/ insideous onset in early 40's- no known precipitating event; no known family hx of limb swelling; Transcatheter aortic valve replacement 02/2017;  Hx depression; orbid obesity; HTN; FIGO stage II Endometrial cancer (HCC); external XRT and brachytherapy complete; hx melanoma excision; urinary incontinence,     Limitations  chronic leg pain and swelling, R>L; limits  ability to participate in activities requiring standing and walking > 30 minutes, ; contributes to difficulty reaching feet to inspect skin, perform nail and sking care, and bathe feet and distal legs, LB bathing;,contributes. L Chronic leg swelling limits ability to dress and fit LB clothing and street shoes,. Chronic, progressive BLE lymphedema  contributes to impaired body image and depression.    Special Tests  Lymphedema Life Impact Scale (LLIS) score- baseline = 32, which translates to 47% iperceived impairement due to lymphedema over the past week    Patient Stated Goals  get the swelling and pain in my legs down so I can walk more and wear normal shoes    Currently in Pain?  No/denies    Pain Onset  More than a month ago   > 20 years                  OT Treatments/Exercises (OP) - 10/31/17 0001      ADLs   ADL Education Given  Yes      Manual Therapy   Manual Therapy  Edema management;Soft tissue mobilization;Manual Lymphatic Drainage (MLD)    Manual Lymphatic Drainage (MLD)  MLD and skin care to RLE today utilizing short neck sequence, deep abdominal breathing, functional healthy inguinal LN, and J strokes to leg segments and foot.       Compression Bandaging  RLE compression knee high applied with Max A due to time constraints.             OT Education - 10/31/17 1212    Education provided  Yes    Education Details  Continued skilled Pt/caregiver education  And LE ADL training throughout visit for lymphedema self care/ home program, including compression wrapping, compression garment and device wear/care, lymphatic pumping ther ex, simple self-MLD, and skin care. Discussed progress towards goals.     Person(s) Educated  Patient    Methods  Explanation;Demonstration    Comprehension  Verbalized understanding;Returned demonstration          OT Long Term Goals - 10/10/17 1452      OT LONG TERM GOAL #1   Title  Pt modified independent w/ lymphedema  precautions/prevention principals and using printed reference to limit LE progression and infection risk.    Baseline  Max A    Time  10    Period  Days    Status  Achieved      OT LONG TERM GOAL #2   Title  Lymphedema (LE) management/ self-care: Pt able to apply knee -length gradient compression wraps with extra time and printed reference PRN (modified independence) within 10 visits to achieve optimal limb volume reduction during Intensive Phase CDT.    Baseline  dependent    Time  10    Period  Days    Status  Achieved      OT LONG TERM GOAL #3   Title  Lymphedema (LE) management/ self-care:  Pt to achieve at least 10% limb volume reductions below knees bilaterally during Intensive Phase CDT to limit LE progression, to improve tissue integrity, to decrease infection risk and to improve functional arm and hand use essential for basic and instrumental ADLs performance.    Baseline  dependent   GOAL MET FOR  LLE : LLE decreased in volume by 7.83% beloe the knee, by 16.92% in thigh, and by 13.68% overall.  RLE GOAL PARTIALLY MET: RLE decrweased by 6.3% below the knee, by 5,.80% IN THIGH, AND BY 5.98% OVERALL.     Time  12    Period  Weeks    Status  Partially Met      OT LONG TERM GOAL #4   Title  Lymphedema (LE) management/ self-care:  Pt to tolerate daily compression wraps, compression garments and/ or HOS devices in keeping w/ prescribed wear regime within 1 week of issue date of each to progress and retain clinical and functional gains and to limit LE progression.    Baseline  Max A    Time  12    Period  Weeks    Status  Achieved      OT LONG TERM GOAL #5   Title  Lymphedema (LE) management/ self-care:  During Management Phase CDT Pt to sustain current limb volumes within 5%, and all other clinical gains achieved during  OT for CDT.OT treatment independently (to control limb swelling and associated pain, to  limit LE progression, to decrease infection risk and to limit further  functional decline.    Baseline  Max A    Time  6    Period  Months    Status  On-going      Long Term Additional Goals   Additional Long Term Goals  Yes            Plan - 10/31/17 1213    Clinical Impression Statement  Provided  RLE MLD and skin care in conjunction w/ Pt edu throughout session for LE self-care. Pt tolerated all aspects of CDT without dfifficulty. Cont to support Pt w/ healthy eating and weight loss to aid in LE management over time. Cont as per POC.    Occupational performance deficits (Please refer to evaluation for details):  ADL's;IADL's;Work;Leisure;Social Participation;Other   body image   Rehab Potential  Good    OT Frequency  1x / week    OT Duration  6 weeks    OT Treatment/Interventions  Self-care/ADL training;Therapeutic exercise;Manual Therapy;Manual lymph drainage;Therapeutic activities;DME and/or AE instruction;Compression bandaging;Other (comment);Patient/family education   skin care with low ph lotion and / or castor oil to improve skin hydration and flexibility   Plan  Cont OT for CDT to LLE 1 x weekly until LLE custom compression garment is fit and assessment is finalized. Schedule 1 and 3 month follow ups to assess progress towards long term self-management goal and to assist w/ custom garment replacement PRN    Clinical Decision Making  Several treatment options, min-mod task modification necessary    Recommended Other Services  fit with knee length, custom, ccl 2-3, flat knit compression stockings, and knee length HOS devices to facilitate improved lymphatic function and decrease fibrosis formation during HOS    Consulted and Agree with Plan of Care  Patient       Patient will benefit from skilled therapeutic intervention in order to improve the following deficits and impairments:  Decreased skin integrity, Decreased knowledge of precautions, Decreased knowledge of use of DME, Impaired flexibility, Decreased balance, Decreased mobility, Obesity,  Decreased range of motion, Increased edema, Pain  Visit Diagnosis: Lymphedema, not elsewhere classified    Problem List Patient Active Problem List   Diagnosis Date Noted  . Other neutropenia ([place]) 07/15/2017  . Endometrial cancer ([place]) 04/10/2017  . 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 Chronic acquired lymphedema 01/08/2017  . Obesity, morbid, BMI 40.0-49.9 ([place]) 11/13/2016  . FIGO stage II endometrial cancer ([place]) 10/29/2016  . Left ventricular hypertrophy 11/27/2014  . Lymphedema of lower extremity 08/23/2014  . Severe aortic stenosis 08/21/2014  . Benign essential HTN 08/21/2014  . Depression, major, recurrent, mild ([place]) 08/21/2014  . Dyslipidemia 08/21/2014  . Cardiac dysrhythmia 08/21/2014  . Dysmetabolic syndrome 91/69/4503  . Symptomatic menopausal or female climacteric states 08/21/2014  . 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 History of shingles 08/21/2014  . Personal history of skin cancer 11/24/2009    Melody Spearman, MS, OTR/L, [facility] 10/31/17 12:16 PM  [place] MAIN [facility] SERVICES [address] [place], [place], [address] Phone: [phone]   Fax:  [phone]  Name: Melody Soto MRN: [id number] Date of Birth: 1950-09-19

## 2017-11-07 ENCOUNTER — Ambulatory Visit: Payer: Medicare HMO | Admitting: Occupational Therapy

## 2017-11-07 DIAGNOSIS — I890 Lymphedema, not elsewhere classified: Secondary | ICD-10-CM | POA: Diagnosis not present

## 2017-11-07 NOTE — Therapy (Signed)
[place] MAIN [facility] SERVICES [address] [place], [place], [address] Phone: [phone]   Fax:  [phone]  Occupational Therapy Treatment  Patient Details  Name: Melody Soto MRN: [id number] Date of Birth: [birth year] No data recorded  Encounter Date: 11/07/2017  OT End of Session - 11/07/17 1619    Visit Number  4    Number of Visits  12    Date for OT Re-Evaluation  11/21/17    OT Start Time  0100    OT Stop Time  0215    OT Time Calculation (min)  75 min    Activity Tolerance  Patient tolerated treatment well;No increased pain    Behavior During Therapy  WFL for tasks assessed/performed       Past Medical History:  Diagnosis Date  . Aortic stenosis   . Cataract   . Depression   . Endometrial cancer determined by uterine biopsy ([place]) 10/29/2016  . Heart murmur    HX OF  . Hyperlipidemia   . Hypertension    CONTROLLED ON MEDS  . Lymphedema   . Shortness of breath dyspnea     Past Surgical History:  Procedure Laterality Date  . 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 cataract surgery Bilateral 05/24/2014   second eye 06/07/2014  . MELANOMA EXCISION    . OVARIAN CYST REMOVAL     EXPLORATORY LAPAROTOMY  . TONSILLECTOMY      There were no vitals filed for this visit.  Subjective Assessment - 11/07/17 1313    Subjective   Melody Soto presents for Occupational Therapy visit 4/12 to address BLE with CDT. Pt has no new complaints. Pt verbalized understanding that next session is last OT visit.    Pertinent History  BLE swelling w/ insideous onset in early 40's- no known precipitating event; no known family hx of limb swelling; Transcatheter aortic valve replacement 02/2017;  Hx depression; orbid obesity; HTN; FIGO stage II Endometrial cancer (HCC); external XRT and brachytherapy complete; hx melanoma excision; urinary incontinence,     Limitations  chronic leg pain and swelling, R>L; limits ability to participate  in activities requiring standing and walking > 30 minutes, ; contributes to difficulty reaching feet to inspect skin, perform nail and sking care, and bathe feet and distal legs, LB bathing;,contributes. L Chronic leg swelling limits ability to dress and fit LB clothing and street shoes,. Chronic, progressive BLE lymphedema  contributes to impaired body image and depression.    Special Tests  Lymphedema Life Impact Scale (LLIS) score- baseline = 32, which translates to 47% iperceived impairement due to lymphedema over the past week    Patient Stated Goals  get the swelling and pain in my legs down so I can walk more and wear normal shoes    Pain Onset  More than a month ago   > 20 years                          OT Education - 11/07/17 1620    Education provided  Yes    Education Details  Continued skilled Pt/caregiver education  And LE ADL training throughout visit for lymphedema self care/ home program, including compression wrapping, compression garment and device wear/care, lymphatic pumping ther ex, simple self-MLD, and skin care. Discussed progress towards goals.     Person(s) Educated  Patient    Methods  Explanation;Demonstration    Comprehension  Verbalized understanding;Returned demonstration  OT Long Term Goals - 10/10/17 1452      OT LONG TERM GOAL #1   Title  Pt modified independent w/ lymphedema precautions/prevention principals and using printed reference to limit LE progression and infection risk.    Baseline  Max A    Time  10    Period  Days    Status  Achieved      OT LONG TERM GOAL #2   Title  Lymphedema (LE) management/ self-care: Pt able to apply knee -length gradient compression wraps with extra time and printed reference PRN (modified independence) within 10 visits to achieve optimal limb volume reduction during Intensive Phase CDT.    Baseline  dependent    Time  10    Period  Days    Status  Achieved      OT LONG TERM GOAL #3    Title  Lymphedema (LE) management/ self-care:  Pt to achieve at least 10% limb volume reductions below knees bilaterally during Intensive Phase CDT to limit LE progression, to improve tissue integrity, to decrease infection risk and to improve functional arm and hand use essential for basic and instrumental ADLs performance.    Baseline  dependent   GOAL MET FOR  LLE : LLE decreased in volume by 7.83% beloe the knee, by 16.92% in thigh, and by 13.68% overall.  RLE GOAL PARTIALLY MET: RLE decrweased by 6.3% below the knee, by 5,.80% IN THIGH, AND BY 5.98% OVERALL.     Time  12    Period  Weeks    Status  Partially Met      OT LONG TERM GOAL #4   Title  Lymphedema (LE) management/ self-care:  Pt to tolerate daily compression wraps, compression garments and/ or HOS devices in keeping w/ prescribed wear regime within 1 week of issue date of each to progress and retain clinical and functional gains and to limit LE progression.    Baseline  Max A    Time  12    Period  Weeks    Status  Achieved      OT LONG TERM GOAL #5   Title  Lymphedema (LE) management/ self-care:  During Management Phase CDT Pt to sustain current limb volumes within 5%, and all other clinical gains achieved during  OT for CDT.OT treatment independently (to control limb swelling and associated pain, to  limit LE progression, to decrease infection risk and to limit further functional decline.    Baseline  Max A    Time  6    Period  Months    Status  On-going      Long Term Additional Goals   Additional Long Term Goals  Yes            Plan - 11/07/17 1621    Clinical Impression Statement  Provided RLE MLD and skin care in conjunction w/ Pt edu throughout session for LE self-care. Pt tolerated all aspects of CDT without dfifficulty. Cont to support Pt w/ healthy eating and weight loss to aid in LE management over time. Cont as per POC. Pt agrees w/ plan to DC OT next visit.     Occupational performance deficits (Please  refer to evaluation for details):  ADL's;IADL's;Work;Leisure;Social Participation;Other   body image   Rehab Potential  Good    OT Frequency  1x / week    OT Duration  6 weeks    OT Treatment/Interventions  Self-care/ADL training;Therapeutic exercise;Manual Therapy;Manual lymph drainage;Therapeutic activities;DME and/or AE instruction;Compression bandaging;Other (comment);Patient/family  education   skin care with low ph lotion and / or castor oil to improve skin hydration and flexibility   Plan  Cont OT for CDT to LLE 1 x weekly until LLE custom compression garment is fit and assessment is finalized. Schedule 1 and 3 month follow ups to assess progress towards long term self-management goal and to assist w/ custom garment replacement PRN    Clinical Decision Making  Several treatment options, min-mod task modification necessary    Recommended Other Services  fit with knee length, custom, ccl 2-3, flat knit compression stockings, and knee length HOS devices to facilitate improved lymphatic function and decrease fibrosis formation during HOS    Consulted and Agree with Plan of Care  Patient       Patient will benefit from skilled therapeutic intervention in order to improve the following deficits and impairments:  Decreased skin integrity, Decreased knowledge of precautions, Decreased knowledge of use of DME, Impaired flexibility, Decreased balance, Decreased mobility, Obesity, Decreased range of motion, Increased edema, Pain  Visit Diagnosis: Lymphedema, not elsewhere classified    Problem List Patient Active Problem List   Diagnosis Date Noted  . Other neutropenia ([place]) 07/15/2017  . Endometrial cancer ([place]) 04/10/2017  . 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 Chronic acquired lymphedema 01/08/2017  . Obesity, morbid, BMI 40.0-49.9 ([place]) 11/13/2016  . FIGO stage II endometrial cancer ([place]) 10/29/2016  . Left ventricular hypertrophy 11/27/2014  . Lymphedema of  lower extremity 08/23/2014  . Severe aortic stenosis 08/21/2014  . Benign essential HTN 08/21/2014  . Depression, major, recurrent, mild ([place]) 08/21/2014  . Dyslipidemia 08/21/2014  . Cardiac dysrhythmia 08/21/2014  . Dysmetabolic syndrome 22/48/2500  . Symptomatic menopausal or female climacteric states 08/21/2014  . 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 History of shingles 08/21/2014  . Personal history of skin cancer 11/24/2009    Andrey Spearman, MS, OTR/L, [facility] 11/07/17 4:24 PM    [place] MAIN [facility] SERVICES [address] [place], [place], [address] Phone: [phone]   Fax:  [phone]  Name: Melody Soto MRN: [id number] Date of Birth: 07-Sep-1950

## 2017-11-14 ENCOUNTER — Ambulatory Visit: Payer: Medicare HMO | Attending: Family Medicine | Admitting: Occupational Therapy

## 2017-11-14 DIAGNOSIS — I890 Lymphedema, not elsewhere classified: Secondary | ICD-10-CM

## 2017-11-14 NOTE — Therapy (Signed)
[place] MAIN [facility] SERVICES [address] [place], [place], [address] Phone: [phone]   Fax:  [phone]  Occupational Therapy Treatment Note, Progress Report and Discharge Summary: Lymphedema Care  Patient Details  Name: Melody Soto MRN: [id number] Date of Birth: 03-21-1950 No data recorded  Encounter Date: 11/14/2017  OT End of Session - 11/14/17 1648    Visit Number  6    Number of Visits  12    Date for OT Re-Evaluation  11/21/17    Authorization Type  total number of OT visist for BLE lymphedema care is 43 since initial evaluation on 05/13/2017.     OT Start Time  0100    OT Stop Time  0208    OT Time Calculation (min)  68 min    Activity Tolerance  Patient tolerated treatment well;No increased pain    Behavior During Therapy  WFL for tasks assessed/performed       Past Medical History:  Diagnosis Date  . Aortic stenosis   . Cataract   . Depression   . Endometrial cancer determined by uterine biopsy ([place]) 10/29/2016  . Heart murmur    HX OF  . Hyperlipidemia   . Hypertension    CONTROLLED ON MEDS  . Lymphedema   . Shortness of breath dyspnea     Past Surgical History:  Procedure Laterality Date  . 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 cataract surgery Bilateral 05/24/2014   second eye 06/07/2014  . MELANOMA EXCISION    . OVARIAN CYST REMOVAL     EXPLORATORY LAPAROTOMY  . TONSILLECTOMY      There were no vitals filed for this visit.  Subjective Assessment - 11/14/17 1321    Subjective   [name] presents for Occupational Therapy visit 5/12 of current, updated POC. Today is 43rd visit overall since initially evaluated for OT for LE care on 05/13/2017. Pt has no new comlaints since last seen She states she feels she is managing LE well and is in agreement with plan to discharge OT today.    Pertinent History  BLE swelling w/ insideous onset in early 40's- no known precipitating event; no known  family hx of limb swelling; Transcatheter aortic valve replacement 02/2017;  Hx depression; orbid obesity; HTN; FIGO stage II Endometrial cancer (HCC); external XRT and brachytherapy complete; hx melanoma excision; urinary incontinence,     Limitations  chronic leg pain and swelling, R>L; limits ability to participate in activities requiring standing and walking > 30 minutes, ; contributes to difficulty reaching feet to inspect skin, perform nail and sking care, and bathe feet and distal legs, LB bathing;,contributes. L Chronic leg swelling limits ability to dress and fit LB clothing and street shoes,. Chronic, progressive BLE lymphedema  contributes to impaired body image and depression.    Special Tests  Final Lymphedema Life Impact Scale (LLIS) is decreased from 47% impairment related to lymphedema initially to 3% perceived  disability related to LE at DC.     Patient Stated Goals  get the swelling and pain in my legs down so I can walk more and wear normal shoes    Currently in Pain?  No/denies    Pain Onset  More than a month ago   > 20 years                  OT Treatments/Exercises (OP) - 11/14/17 0001      ADLs   ADL Education Given  Yes  Manual Therapy   Manual Therapy  Edema management;Compression Bandaging    Manual therapy comments  Pt completed Lymphedema Life Impat Scale.     Compression Bandaging  4 layer gradient compression wrap applied to RUE and hand as demonstration requested by Pt.             OT Education - 11/14/17 1635    Education provided  Yes    Education Details  Reviewed lymphedema prevention  principals and precautions, including signs and symptoms of cellulitis. Reviwed lymphedema progression and all home program components,. Stressed importance of ongoing compliance with all daily protocols. Taught compression arm bandagng by Pt request. She presents with fatty edema in bilateral arms resulting fomr liopo-lymphedema as well and she want to  try compression wwraps or sleeves on her own at home on trial basis.     Person(s) Educated  Patient    Methods  Explanation;Demonstration    Comprehension  Verbalized understanding;Returned demonstration          OT Long Term Goals - 11/14/17 1647      OT LONG TERM GOAL #1   Title  Pt modified independent w/ lymphedema precautions/prevention principals and using printed reference to limit LE progression and infection risk.    Baseline  Max A    Time  10    Period  Days    Status  Achieved      OT LONG TERM GOAL #2   Title  Lymphedema (LE) management/ self-care: Pt able to apply knee -length gradient compression wraps with extra time and printed reference PRN (modified independence) within 10 visits to achieve optimal limb volume reduction during Intensive Phase CDT.    Baseline  dependent    Time  10    Period  Days    Status  Achieved      OT LONG TERM GOAL #3   Title  Lymphedema (LE) management/ self-care:  Pt to achieve at least 10% limb volume reductions below knees bilaterally during Intensive Phase CDT to limit LE progression, to improve tissue integrity, to decrease infection risk and to improve functional arm and hand use essential for basic and instrumental ADLs performance.    Baseline  dependent   GOAL MET FOR  LLE : LLE decreased in volume by 7.83% beloe the knee, by 16.92% in thigh, and by 13.68% overall.  RLE GOAL PARTIALLY MET: RLE decrweased by 6.3% below the knee, by 5,.80% IN THIGH, AND BY 5.98% OVERALL.     Time  12    Period  Weeks    Status  Achieved      OT LONG TERM GOAL #4   Title  Lymphedema (LE) management/ self-care:  Pt to tolerate daily compression wraps, compression garments and/ or HOS devices in keeping w/ prescribed wear regime within 1 week of issue date of each to progress and retain clinical and functional gains and to limit LE progression.    Baseline  Max A    Time  12    Period  Weeks    Status  Achieved      OT LONG TERM GOAL #5    Title  Lymphedema (LE) management/ self-care:  During Management Phase CDT Pt to sustain current limb volumes within 5%, and all other clinical gains achieved during  OT for CDT.OT treatment independently (to control limb swelling and associated pain, to  limit LE progression, to decrease infection risk and to limit further functional decline.    Baseline  Max A  Time  6    Period  Months    Status  On-going            Plan - 11/14/17 1639    Clinical Impression Statement  Mrs. McGowabn is DC from OT for LE care today. She has met and exceeded all goals for LE managemnt and is ready to transition to self-cae phase of CDT.  Pt  has been fitted with appropriate, well-fitting, comfortable BLE custom compression garments for daytime use, and with HOS devices bilaterally. She is 100% compliant with garments. Skin is well hydrated with softer, more pliable and flexible fatty fibrosis at ankles. Pt utilized sequential compression device daily , which was donated by another very  generous Pt's spouse. Pt has lost several pounds over the course of treatment as her activity level has also increased over time. She is less self -conscious about her appearance with decreased leg swelling. She is wearing leggings frequently and told me today she is even considering buying a dress. Pt's mobility is much improved requarding transfers and she reports today she tolerates > 1..5 hours at [place] when shopping. It has been my pleasure to provide OT services for lymphedema care to Mrs. McG9owan. I advised her to request a new referral when she returns in 4-6 months for new compression garment measurements.    Occupational performance deficits (Please refer to evaluation for details):  ADL's;IADL's;Work;Leisure;Social Participation;Other   body image   Rehab Potential  Good    OT Frequency  1x / week    OT Duration  6 weeks    OT Treatment/Interventions  Self-care/ADL training;Therapeutic exercise;Manual  Therapy;Manual lymph drainage;Therapeutic activities;DME and/or AE instruction;Compression bandaging;Other (comment);Patient/family education   skin care with low ph lotion and / or castor oil to improve skin hydration and flexibility   Plan  Cont OT for CDT to LLE 1 x weekly until LLE custom compression garment is fit and assessment is finalized. Schedule 1 and 3 month follow ups to assess progress towards long term self-management goal and to assist w/ custom garment replacement PRN    Clinical Decision Making  Several treatment options, min-mod task modification necessary    Recommended Other Services  fit with knee length, custom, ccl 2-3, flat knit compression stockings, and knee length HOS devices to facilitate improved lymphatic function and decrease fibrosis formation during HOS    Consulted and Agree with Plan of Care  Patient       Patient will benefit from skilled therapeutic intervention in order to improve the following deficits and impairments:  Decreased skin integrity, Decreased knowledge of precautions, Decreased knowledge of use of DME, Impaired flexibility, Decreased balance, Decreased mobility, Obesity, Decreased range of motion, Increased edema, Pain  Visit Diagnosis: Lymphedema, not elsewhere classified    Problem List Patient Active Problem List   Diagnosis Date Noted  . Other neutropenia ([place]) 07/15/2017  . Endometrial cancer ([place]) 04/10/2017  . 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 Chronic acquired lymphedema 01/08/2017  . Obesity, morbid, BMI 40.0-49.9 ([place]) 11/13/2016  . FIGO stage II endometrial cancer ([place]) 10/29/2016  . Left ventricular hypertrophy 11/27/2014  . Lymphedema of lower extremity 08/23/2014  . Severe aortic stenosis 08/21/2014  . Benign essential HTN 08/21/2014  . Depression, major, recurrent, mild ([place]) 08/21/2014  . Dyslipidemia 08/21/2014  . Cardiac dysrhythmia 08/21/2014  . Dysmetabolic syndrome 02/20/3233  .  Symptomatic menopausal or female climacteric states 08/21/2014  . 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 History of shingles 08/21/2014  .  Personal history of skin cancer 11/24/2009    Andrey Spearman, MS, OTR/L, [facility] 11/14/17 4:50 PM  [place] MAIN [facility] SERVICES [address] [place], [place], [address] Phone: [phone]   Fax:  [phone]  Name: Melody Soto MRN: [id number] Date of Birth: [birth year]

## 2017-11-15 ENCOUNTER — Ambulatory Visit: Payer: Medicare HMO | Admitting: Family Medicine

## 2017-11-19 ENCOUNTER — Encounter: Payer: Self-pay | Admitting: Urology

## 2017-11-19 ENCOUNTER — Ambulatory Visit (INDEPENDENT_AMBULATORY_CARE_PROVIDER_SITE_OTHER): Payer: Medicare HMO | Admitting: Urology

## 2017-11-19 VITALS — BP 153/77 | HR 80 | Ht 68.0 in | Wt 251.0 lb

## 2017-11-19 DIAGNOSIS — R32 Unspecified urinary incontinence: Secondary | ICD-10-CM | POA: Diagnosis not present

## 2017-11-19 DIAGNOSIS — N393 Stress incontinence (female) (male): Secondary | ICD-10-CM | POA: Diagnosis not present

## 2017-11-19 DIAGNOSIS — N3941 Urge incontinence: Secondary | ICD-10-CM

## 2017-11-19 DIAGNOSIS — R3915 Urgency of urination: Secondary | ICD-10-CM | POA: Diagnosis not present

## 2017-11-19 LAB — MICROSCOPIC EXAMINATION
RBC, UA: NONE SEEN /hpf (ref 0–2)
WBC, UA: NONE SEEN /hpf (ref 0–5)

## 2017-11-19 LAB — URINALYSIS, COMPLETE
Bilirubin, UA: NEGATIVE
GLUCOSE, UA: NEGATIVE
LEUKOCYTES UA: NEGATIVE
Nitrite, UA: NEGATIVE
Protein, UA: NEGATIVE
RBC, UA: NEGATIVE
SPEC GRAV UA: 1.025 (ref 1.005–1.030)
Urobilinogen, Ur: 0.2 mg/dL (ref 0.2–1.0)
pH, UA: 5 (ref 5.0–7.5)

## 2017-11-19 MED ORDER — MIRABEGRON ER 25 MG PO TB24: 25.0000 mg | ORAL_TABLET | Freq: Every day | ORAL | 3 refills | Status: DC

## 2017-11-19 NOTE — Progress Notes (Signed)
11/19/2017 8:19 AM   Melody Soto [birth year] [id number]  Referring provider: Verlon Au, NP [place], [place] [address]  Chief Complaint  Patient presents with  . Urinary Incontinence    New Patient    HPI: 67 year old female referred for further evaluation of urinary incontinence.  She was recently diagnosed with stage II endometrial cancer, grade 1 status post primary radiation/ brachytherapy  completed in 1/ 2019.   Ever since placement of her radiation seeds, she is developed worsening urinary urgency, frequency, and occasional urge incontinence.  This is very bothersome to her.  She denies any dysuria or gross hematuria.  No history of urinary tract infections.  She does have a personal history of very mild stress urinary incontinence, small amount of leaking with laughing, coughing, or sneezing.  This is unchanged since onset of her urgency symptoms and not as bothersome to her as the above.  She has not yet tried any medications.  She does drink 1 cup of coffee in the morning and is tried cutting back on this without much improvement in her symptoms.  No alleviating factors.  She had multiple recent pelvic exams by GYN including as recently as 10/09/2017 by Dr. Theora Gianotti which was unremarkable, without prolapse.  She does have lymphedema bilaterally and wearing compression stockings.  She does have occasional struggles with constipation.   PMH: Past Medical History:  Diagnosis Date  . Aortic stenosis   . Cataract   . Depression   . Endometrial cancer determined by uterine biopsy ([place]) 10/29/2016  . Heart murmur    HX OF  . Hyperlipidemia   . Hypertension    CONTROLLED ON MEDS  . Lymphedema   . Shortness of breath dyspnea     Surgical History: Past Surgical History:  Procedure Laterality Date  . 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 cataract surgery Bilateral 05/24/2014   second eye 06/07/2014  . MELANOMA EXCISION    .  OVARIAN CYST REMOVAL     EXPLORATORY LAPAROTOMY  . TONSILLECTOMY      Home Medications:  Allergies as of 11/19/2017      Reactions   Ace Inhibitors Other (See Comments)   UNKNOWN REACTION   Duloxetine    drowsiness      Medication List        Accurate as of 11/19/17 11:59 PM. Always use your most recent med list.          aspirin 81 MG chewable tablet Chew by mouth. PM   CALCIUM 500 + D3 500-200 MG-UNIT Tabs Generic drug:  Calcium Carb-Cholecalciferol Take 1 capsule by mouth 2 (two) times daily.   CoQ10 50 MG Caps Take 1 tablet by mouth.   hydrochlorothiazide 12.5 MG tablet Commonly known as:  HYDRODIURIL Take 1 tablet (12.5 mg total) by mouth daily.   magnesium oxide 400 MG tablet Commonly known as:  MAG-OX Take 1 tablet by mouth 2 (two) times daily.   metoprolol tartrate 25 MG tablet Commonly known as:  LOPRESSOR Take 0.5 tablets (12.5 mg total) by mouth 2 (two) times daily.   mirabegron ER 25 MG Tb24 tablet Commonly known as:  MYRBETRIQ Take 1 tablet (25 mg total) by mouth daily.   MULTIPLE VITAMIN PO Take 2 tablets by mouth 2 (two) times daily. Reported on 05/19/2015   pravastatin 20 MG tablet Commonly known as:  PRAVACHOL Take 1 tablet (20 mg total) by mouth daily.   vitamin B-12 1000 MCG tablet Commonly known as:  CYANOCOBALAMIN Take 1,000 mcg by mouth daily.       Allergies:  Allergies  Allergen Reactions  . Ace Inhibitors Other (See Comments)    UNKNOWN REACTION  . Duloxetine     drowsiness    Family History: Family History  Problem Relation Age of Onset  . Cancer Mother   . Stroke Mother   . Thyroid nodules Mother   . Diabetes Sister   . Hypertension Sister   . Breast cancer Maternal Aunt   . Bone cancer Maternal Uncle   . Depression Daughter   . Depression Daughter     Social History:  reports that she has never smoked. She has never used smokeless tobacco. She reports that she drinks alcohol. She reports that she does not use  drugs.  ROS: UROLOGY Frequent Urination?: No Hard to postpone urination?: Yes Burning/pain with urination?: No Get up at night to urinate?: Yes Leakage of urine?: Yes Urine stream starts and stops?: No Trouble starting stream?: No Do you have to strain to urinate?: No Blood in urine?: No Urinary tract infection?: No Sexually transmitted disease?: No Injury to kidneys or bladder?: No Painful intercourse?: No Weak stream?: No Currently pregnant?: No Vaginal bleeding?: No Last menstrual period?: n  Gastrointestinal Nausea?: No Vomiting?: No Indigestion/heartburn?: No Diarrhea?: No Constipation?: No  Constitutional Fever: No Night sweats?: No Weight loss?: No Fatigue?: No  Skin Skin rash/lesions?: No Itching?: No  Eyes Blurred vision?: No Double vision?: No  Ears/Nose/Throat Sore throat?: No Sinus problems?: No  Hematologic/Lymphatic Swollen glands?: No Easy bruising?: Yes  Cardiovascular Leg swelling?: No Chest pain?: No  Respiratory Cough?: No Shortness of breath?: No  Endocrine Excessive thirst?: No  Musculoskeletal Back pain?: No Joint pain?: Yes  Neurological Headaches?: No Dizziness?: No  Psychologic Depression?: No Anxiety?: No  Physical Exam: BP (!) 153/77   Pulse 80   Ht 5\' 8"  (1.727 m)   Wt 251 lb (113.9 kg)   BMI 38.16 kg/m   Constitutional:  Alert and oriented, No acute distress. HEENT: [place] AT, moist mucus membranes.  Trachea midline, no masses. Respiratory: Normal respiratory effort, no increased work of breathing. GI: Abdomen is soft, nontender, nondistended, no abdominal masses, BS GU: No CVA tenderness Lymph: Bilateral lower extremity lymphedema appreciated Skin: No rashes, bruises or suspicious lesions. Neurologic: Grossly intact, no focal deficits, moving all 4 extremities. Psychiatric: Normal mood and affect.  Pelvic exam was offered but declined today as she is recently had this done by Dr. Theora Gianotti.  Laboratory  Data: Lab Results  Component Value Date   WBC 5.7 06/24/2017   HGB 14.7 06/24/2017   HCT 43.7 06/24/2017   MCV 96.0 06/24/2017   PLT 192 06/24/2017    Lab Results  Component Value Date   CREATININE 0.73 06/24/2017    Lab Results  Component Value Date   HGBA1C 5.8 (H) 06/24/2017    Urinalysis Results for orders placed or performed in visit on 11/19/17  Microscopic Examination  Result Value Ref Range   WBC, UA None seen 0 - 5 /hpf   RBC, UA None seen 0 - 2 /hpf   Epithelial Cells (non renal) 0-10 0 - 10 /hpf   Bacteria, UA Moderate (A) None seen/Few  Urinalysis, Complete  Result Value Ref Range   Specific Gravity, UA 1.025 1.005 - 1.030   pH, UA 5.0 5.0 - 7.5   Color, UA Yellow Yellow   Appearance Ur Clear Clear   Leukocytes, UA Negative Negative   Protein, UA  Negative Negative/Trace   Glucose, UA Negative Negative   Ketones, UA 1+ (A) Negative   RBC, UA Negative Negative   Bilirubin, UA Negative Negative   Urobilinogen, Ur 0.2 0.2 - 1.0 mg/dL   Nitrite, UA Negative Negative   Microscopic Examination See below:     Pertinent Imaging: N/a  Assessment & Plan:    1. Urge incontinence Fairly recent onset of urinary urgency and urge incontinence this year following radiation, likely secondary to bladder irritation We discussed behavioral modification including avoidance of irritating beverages and weight loss as primary treatment We discussed the algorithm for management of OAB at length today We discussed pharmacotherapy in the form of anticholinergics versus beta 3 agonist Given her history of constipation, I prefer to start with beta 3 agonist and she was given 25 mg of Myrbetriq x1 month today as sample She will contact us and let us know how it goes, she like to fill this prescription versus increase the dose or try an alternative medication We will check back in with her in 3 months with PVR  - Urinalysis, Complete  2. Urinary urgency As above  3. Stress  incontinence, female Minimal,  Discussed pelvic floor exercises and weight loss as primary intervention   Return in about 3 months (around 02/19/2018) for PVR, symptoms review.  Hollice Espy, MD  [facility] [address] [place], [place] [address] [phone]

## 2017-12-31 ENCOUNTER — Other Ambulatory Visit: Payer: Self-pay | Admitting: Family Medicine

## 2017-12-31 ENCOUNTER — Encounter: Payer: Self-pay | Admitting: Family Medicine

## 2017-12-31 DIAGNOSIS — I10 Essential (primary) hypertension: Secondary | ICD-10-CM

## 2017-12-31 MED ORDER — HYDROCHLOROTHIAZIDE 12.5 MG PO TABS: 12.5000 mg | ORAL_TABLET | Freq: Every day | ORAL | 0 refills | Status: DC

## 2018-01-01 ENCOUNTER — Other Ambulatory Visit: Payer: Self-pay | Admitting: Family Medicine

## 2018-01-17 DIAGNOSIS — G4733 Obstructive sleep apnea (adult) (pediatric): Secondary | ICD-10-CM | POA: Diagnosis not present

## 2018-01-17 DIAGNOSIS — Z952 Presence of prosthetic heart valve: Secondary | ICD-10-CM | POA: Diagnosis not present

## 2018-01-17 DIAGNOSIS — I890 Lymphedema, not elsewhere classified: Secondary | ICD-10-CM | POA: Diagnosis not present

## 2018-01-17 DIAGNOSIS — E782 Mixed hyperlipidemia: Secondary | ICD-10-CM | POA: Diagnosis not present

## 2018-01-17 DIAGNOSIS — R0602 Shortness of breath: Secondary | ICD-10-CM | POA: Diagnosis not present

## 2018-01-17 DIAGNOSIS — I10 Essential (primary) hypertension: Secondary | ICD-10-CM | POA: Diagnosis not present

## 2018-01-17 DIAGNOSIS — I208 Other forms of angina pectoris: Secondary | ICD-10-CM | POA: Diagnosis not present

## 2018-01-17 DIAGNOSIS — C541 Malignant neoplasm of endometrium: Secondary | ICD-10-CM | POA: Diagnosis not present

## 2018-01-17 DIAGNOSIS — R011 Cardiac murmur, unspecified: Secondary | ICD-10-CM | POA: Diagnosis not present

## 2018-01-29 ENCOUNTER — Ambulatory Visit (INDEPENDENT_AMBULATORY_CARE_PROVIDER_SITE_OTHER): Payer: Medicare HMO | Admitting: Family Medicine

## 2018-01-29 ENCOUNTER — Encounter: Payer: Self-pay | Admitting: Family Medicine

## 2018-01-29 VITALS — BP 134/78 | HR 74 | Temp 98.1°F | Resp 16 | Ht 68.0 in | Wt 256.2 lb

## 2018-01-29 DIAGNOSIS — Z23 Encounter for immunization: Secondary | ICD-10-CM

## 2018-01-29 DIAGNOSIS — R32 Unspecified urinary incontinence: Secondary | ICD-10-CM

## 2018-01-29 DIAGNOSIS — F325 Major depressive disorder, single episode, in full remission: Secondary | ICD-10-CM | POA: Diagnosis not present

## 2018-01-29 DIAGNOSIS — E8881 Metabolic syndrome: Secondary | ICD-10-CM | POA: Diagnosis not present

## 2018-01-29 DIAGNOSIS — I10 Essential (primary) hypertension: Secondary | ICD-10-CM

## 2018-01-29 DIAGNOSIS — E785 Hyperlipidemia, unspecified: Secondary | ICD-10-CM

## 2018-01-29 DIAGNOSIS — I890 Lymphedema, not elsewhere classified: Secondary | ICD-10-CM

## 2018-01-29 DIAGNOSIS — R69 Illness, unspecified: Secondary | ICD-10-CM | POA: Diagnosis not present

## 2018-01-29 DIAGNOSIS — Z8542 Personal history of malignant neoplasm of other parts of uterus: Secondary | ICD-10-CM | POA: Diagnosis not present

## 2018-01-29 MED ORDER — TELMISARTAN 40 MG PO TABS: 40.0000 mg | ORAL_TABLET | Freq: Every day | ORAL | 1 refills | Status: DC

## 2018-01-29 MED ORDER — METOPROLOL TARTRATE 25 MG PO TABS: 25.0000 mg | ORAL_TABLET | Freq: Two times a day (BID) | ORAL | 1 refills | Status: DC

## 2018-01-29 NOTE — Progress Notes (Signed)
Name: Melody Soto   MRN: [id number]    DOB: 03/03/1950   Date:01/29/2018       Progress Note  Subjective  Chief Complaint  Chief Complaint  Patient presents with  . Blood Pressure Check    HPI  HTN: states bp at home has been in the 130's/80's, we started her back on HCTZ May 2019 because bp was elevated during office visits, she is tolerating medication well, however has incontinence and we will try switching to Micardis instead .   Endometrial cancer: she was treated, and is still not working, has one follow up left with radiation oncologist, no vaginal bleeding  Obesity: she has lost 12 lbs since 07/2017, not drinking as much water because of urinary frequency, she states not as compliant with counting calories through the holidays.   Metabolic syndrome: last TDVV6H was 5.8% denies polyphagia, polyuria or polydipsia.   Lymphedema: she responded well to PT , still using a pump at home a few times a week and has been helping   S/p TAVR: she had surgery done at [facility] on 02/27/2017 for severe aortic stenosis. She is on Metoprolol bid  She states palpitation controlled with medication. No symptoms of CHF such as orthopnea.   Depression: she took Celexa many years ago, she has a long history of depression ( worse when was separating from her husband), no crying spells, feeling well now, in remission.   Urinary incontinence: had mild symptoms in the past, however since endometrial cancer, she had brachytherapy she noticed worsening of symptoms, usually voids when getting up from sitting position, mild urinary urgency, she was seen by Dr. Erlene Quan and is now on Myrbetriq , she is still having symptoms, we will change from HCTZ to Micardis.    Patient Active Problem List   Diagnosis Date Noted  . Other neutropenia ([place]) 07/15/2017  . Endometrial cancer ([place]) 04/10/2017  . 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 Chronic acquired lymphedema 01/08/2017  .  Obesity, morbid, BMI 40.0-49.9 ([place]) 11/13/2016  . FIGO stage II endometrial cancer ([place]) 10/29/2016  . Left ventricular hypertrophy 11/27/2014  . Lymphedema of lower extremity 08/23/2014  . Severe aortic stenosis 08/21/2014  . Benign essential HTN 08/21/2014  . Depression, major, recurrent, mild ([place]) 08/21/2014  . Dyslipidemia 08/21/2014  . Cardiac dysrhythmia 08/21/2014  . Dysmetabolic syndrome 60/73/7106  . Symptomatic menopausal or female climacteric states 08/21/2014  . 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 History of shingles 08/21/2014  . Personal history of skin cancer 11/24/2009    Past Surgical History:  Procedure Laterality Date  . 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 cataract surgery Bilateral 05/24/2014   second eye 06/07/2014  . MELANOMA EXCISION    . OVARIAN CYST REMOVAL     EXPLORATORY LAPAROTOMY  . TONSILLECTOMY      Family History  Problem Relation Age of Onset  . Cancer Mother   . Stroke Mother   . Thyroid nodules Mother   . Diabetes Sister   . Hypertension Sister   . Breast cancer Maternal Aunt   . Bone cancer Maternal Uncle   . Depression Daughter   . Depression Daughter     Social History   Socioeconomic History  . Marital status: Divorced    Spouse name: Not on file  . Number of children: 2  . Years of education: Not on file  . Highest education level: Bachelor's degree (e.g., BA, AB, BS)  Occupational History  . Not on  file  Social Needs  . Financial resource strain: Hard  . Food insecurity:    Worry: Never true    Inability: Never true  . Transportation needs:    Medical: No    Non-medical: No  Tobacco Use  . Smoking status: Never Smoker  . Smokeless tobacco: Never Used  Substance and Sexual Activity  . Alcohol use: Yes    Alcohol/week: 0.0 standard drinks    Comment: Minimal alcohol consumption WINE   . Drug use: No  . Sexual activity: Not Currently    Birth control/protection: Post-menopausal   Lifestyle  . Physical activity:    Days per week: 0 days    Minutes per session: 0 min  . Stress: Not at all  Relationships  . Social connections:    Talks on phone: More than three times a week    Gets together: Twice a week    Attends religious service: More than 4 times per year    Active member of club or organization: Yes    Attends meetings of clubs or organizations: More than 4 times per year    Relationship status: Divorced  . Intimate partner violence:    Fear of current or ex partner: No    Emotionally abused: No    Physically abused: No    Forced sexual activity: No  Other Topics Concern  . Not on file  Social History Narrative  . Not on file     Current Outpatient Medications:  .  aspirin 81 MG chewable tablet, Chew by mouth. PM, Disp: , Rfl:  .  Calcium Carb-Cholecalciferol (CALCIUM 500 + D3) 500-200 MG-UNIT TABS, Take 1 capsule by mouth 2 (two) times daily., Disp: , Rfl:  .  Coenzyme Q10 (COQ10) 50 MG CAPS, Take 1 tablet by mouth. , Disp: , Rfl:  .  hydrochlorothiazide (HYDRODIURIL) 12.5 MG tablet, TAKE 1 TABLET BY MOUTH EVERY DAY, Disp: 90 tablet, Rfl: 0 .  magnesium oxide (MAG-OX) 400 MG tablet, Take 1 tablet by mouth 2 (two) times daily., Disp: , Rfl:  .  metoprolol tartrate (LOPRESSOR) 25 MG tablet, Take 0.5 tablets (12.5 mg total) by mouth 2 (two) times daily., Disp: 180 tablet, Rfl: 1 .  mirabegron ER (MYRBETRIQ) 25 MG TB24 tablet, Take 1 tablet (25 mg total) by mouth daily., Disp: 30 tablet, Rfl: 3 .  MULTIPLE VITAMIN PO, Take 2 tablets by mouth 2 (two) times daily. Reported on 05/19/2015, Disp: , Rfl:  .  pravastatin (PRAVACHOL) 20 MG tablet, Take 1 tablet (20 mg total) by mouth daily., Disp: 90 tablet, Rfl: 0 .  vitamin B-12 (CYANOCOBALAMIN) 1000 MCG tablet, Take 1,000 mcg by mouth daily. , Disp: , Rfl:   Allergies  Allergen Reactions  . Ace Inhibitors Other (See Comments)    UNKNOWN REACTION  . Duloxetine     drowsiness    I personally reviewed  active problem list, medication list, allergies, family history, social history with the patient/caregiver today.   ROS  Constitutional: Negative for fever, positive for  weight change.  Respiratory: Negative for cough and shortness of breath.   Cardiovascular: Negative for chest pain or palpitations.  Gastrointestinal: Negative for abdominal pain, no bowel changes.  Musculoskeletal: Negative for gait problem or joint swelling.  Skin: Negative for rash.  Neurological: Negative for dizziness or headache.  No other specific complaints in a complete review of systems (except as listed in HPI above).  Objective  Vitals:   01/29/18 0900  BP: 134/78  Pulse: 74  Resp: 16  Temp: 98.1 F (36.7 C)  TempSrc: Oral  SpO2: 98%  Weight: 256 lb 3.2 oz (116.2 kg)  Height: 5\' 8"  (1.727 m)    Body mass index is 38.96 kg/m.  Physical Exam  Constitutional: Patient appears well-developed and well-nourished. Obese no  distress.  HEENT: head atraumatic, normocephalic, pupils equal and reactive to light,  neck supple, throat within normal limits Cardiovascular: Normal rate, regular rhythm and normal heart sounds.  2/6 systolic  murmur heard. BLE edema - lymphedema. Pulmonary/Chest: Effort normal and breath sounds normal. No respiratory distress. Abdominal: Soft.  There is no tenderness. Psychiatric: Patient has a normal mood and affect. behavior is normal. Judgment and thought content normal.  Recent Results (from the past 2160 hour(s))  Urinalysis, Complete     Status: Abnormal   Collection Time: 11/19/17  2:43 PM  Result Value Ref Range   Specific Gravity, UA 1.025 1.005 - 1.030   pH, UA 5.0 5.0 - 7.5   Color, UA Yellow Yellow   Appearance Ur Clear Clear   Leukocytes, UA Negative Negative   Protein, UA Negative Negative/Trace   Glucose, UA Negative Negative   Ketones, UA 1+ (A) Negative   RBC, UA Negative Negative   Bilirubin, UA Negative Negative   Urobilinogen, Ur 0.2 0.2 - 1.0  mg/dL   Nitrite, UA Negative Negative   Microscopic Examination See below:   Microscopic Examination     Status: Abnormal   Collection Time: 11/19/17  2:43 PM  Result Value Ref Range   WBC, UA None seen 0 - 5 /hpf   RBC, UA None seen 0 - 2 /hpf   Epithelial Cells (non renal) 0-10 0 - 10 /hpf   Bacteria, UA Moderate (A) None seen/Few     PHQ2/9: Depression screen [facility] 2/9 01/29/2018 07/15/2017 06/24/2017 11/21/2016 02/24/2015  Decreased Interest 0 0 0 0 0  Down, Depressed, Hopeless 0 0 0 0 0  PHQ - 2 Score 0 0 0 0 0  Altered sleeping 0 0 0 - -  Tired, decreased energy 0 0 0 - -  Change in appetite 0 0 0 - -  Feeling bad or failure about yourself  0 0 0 - -  Trouble concentrating 0 1 1 - -  Moving slowly or fidgety/restless 0 0 0 - -  Suicidal thoughts 0 0 0 - -  PHQ-9 Score 0 1 1 - -  Difficult doing work/chores Not difficult at all Not difficult at all Not difficult at all - -     Fall Risk: Fall Risk  01/29/2018 07/15/2017 06/24/2017 03/21/2017 11/21/2016  Falls in the past year? 0 No No No No     Assessment & Plan  1. Benign essential HTN  We will stop HcTZ because of urinary incontinence, and we will go back on Micardis  - metoprolol tartrate (LOPRESSOR) 25 MG tablet; Take 1 tablet (25 mg total) by mouth 2 (two) times daily.  Dispense: 180 tablet; Refill: 1 - telmisartan (MICARDIS) 40 MG tablet; Take 1 tablet (40 mg total) by mouth daily.  Dispense: 90 tablet; Refill: 1  2. Dyslipidemia  She goes through periods of not taking medication but is back on it now    3. Major depression in remission [facility])  Doing well now, good support from friends  4. Dysmetabolic syndrome  Discussed risk of developing diabetes   5. Obesity, morbid, BMI 40.0-49.9 ([place])  Discussed with the patient the risk posed by an increased BMI. Discussed importance  of portion control, calorie counting and at least 150 minutes of physical activity weekly. Avoid sweet beverages and drink more water.  Eat at least 6 servings of fruit and vegetables daily   6. History of endometrial cancer  Treated   7. Chronic acquired lymphedema  Doing better  8. Urinary incontinence in female  Seen by Dr. Erlene Quan   9. Needs flu shot  - Flu vaccine HIGH DOSE PF

## 2018-02-21 ENCOUNTER — Ambulatory Visit: Payer: Medicare HMO | Admitting: Radiation Oncology

## 2018-03-11 ENCOUNTER — Ambulatory Visit: Payer: Medicare HMO | Admitting: Urology

## 2018-04-01 ENCOUNTER — Encounter: Payer: Self-pay | Admitting: Obstetrics and Gynecology

## 2018-04-04 ENCOUNTER — Encounter: Payer: Self-pay | Admitting: Radiation Oncology

## 2018-04-04 ENCOUNTER — Other Ambulatory Visit: Payer: Self-pay | Admitting: *Deleted

## 2018-04-04 ENCOUNTER — Ambulatory Visit
Admission: RE | Admit: 2018-04-04 | Discharge: 2018-04-04 | Disposition: A | Discharge status: Home / Self Care | Payer: Medicare HMO | Source: Ambulatory Visit | Attending: Radiation Oncology | Admitting: Radiation Oncology

## 2018-04-04 ENCOUNTER — Other Ambulatory Visit: Payer: Self-pay

## 2018-04-04 VITALS — BP 137/72 | HR 72 | Temp 96.6°F | Resp 18 | Wt 259.7 lb

## 2018-04-04 DIAGNOSIS — C541 Malignant neoplasm of endometrium: Secondary | ICD-10-CM

## 2018-04-04 DIAGNOSIS — R918 Other nonspecific abnormal finding of lung field: Secondary | ICD-10-CM | POA: Insufficient documentation

## 2018-04-04 DIAGNOSIS — R1900 Intra-abdominal and pelvic swelling, mass and lump, unspecified site: Secondary | ICD-10-CM | POA: Diagnosis not present

## 2018-04-04 DIAGNOSIS — Z923 Personal history of irradiation: Secondary | ICD-10-CM | POA: Insufficient documentation

## 2018-04-04 NOTE — Progress Notes (Signed)
Radiation Oncology Follow up Note  Name: Melody VENNE   Date:   04/04/2018 MRN:  [id number] DOB: May 24, 1950    This 68 y.o. female presents to the clinic today for 1 year follow-up status post both external beam radiation therapy as well as brachytherapy for stage IIa well-differentiated endometrial adenocarcinoma.Marland Kitchen  REFERRING PROVIDER: Steele Sizer, MD  HPI: patient is a 68 year old female now out a whole year having completed both external beam radiation therapy as well as brachytherapy for stage II well-differentiated endometrial carcinoma. She is seen today in routine follow-up. She recently noticed some vaginalspotting.She specifically denies diarrhea or any increased lower urinary tract symptoms. We are following some small pulmonary nodules on her CT scans and Dr. Alto Denver had requested follow-up CT scans in March 2020.Marland Kitchenpatient does have some urinary incontinence although this seems stable at this time.  COMPLICATIONS OF TREATMENT: none  FOLLOW UP COMPLIANCE: keeps appointments   PHYSICAL EXAM:  BP 137/72 (BP Location: Left Arm, Patient Position: Sitting)   Pulse 72   Temp (!) 96.6 F (35.9 C) (Tympanic)   Resp 18   Wt 259 lb 11.2 oz (117.8 kg)   BMI 39.49 kg/m  Well-developed well-nourished patient in NAD. HEENT reveals PERLA, EOMI, discs not visualized.  Oral cavity is clear. No oral mucosal lesions are identified. Neck is clear without evidence of cervical or supraclavicular adenopathy. Lungs are clear to A&P. Cardiac examination is essentially unremarkable with regular rate and rhythm without murmur rub or thrill. Abdomen is benign with no organomegaly or masses noted. Motor sensory and DTR levels are equal and symmetric in the upper and lower extremities. Cranial nerves II through XII are grossly intact. Proprioception is intact. No peripheral adenopathy or edema is identified. No motor or sensory levels are noted. Crude visual fields are within normal range.  RADIOLOGY  RESULTS: previous CT scans reviewed  PLAN: this time of ordered repeat CT scans chest abdomen and pelvis. She is seeing Dr. Alto Denver next week and will have a pelvic exam at that time. I've asked her to cut back on her daily 81 mg of aspirin that she may be 2 over coagulated. I've also asked to see her back in 1 year for follow-up. Patient is to call with any concerns at any time.  I would like to take this opportunity to thank you for allowing me to participate in the care of your patient.Noreene Filbert, MD

## 2018-04-04 NOTE — Progress Notes (Signed)
Ct

## 2018-04-05 ENCOUNTER — Encounter: Payer: Self-pay | Admitting: Family Medicine

## 2018-04-09 ENCOUNTER — Other Ambulatory Visit: Payer: Self-pay

## 2018-04-09 ENCOUNTER — Inpatient Hospital Stay: Payer: Medicare HMO | Attending: Obstetrics and Gynecology | Admitting: Obstetrics and Gynecology

## 2018-04-09 VITALS — BP 144/92 | HR 69 | Temp 98.4°F | Resp 18 | Wt 260.0 lb

## 2018-04-09 DIAGNOSIS — I890 Lymphedema, not elsewhere classified: Secondary | ICD-10-CM | POA: Diagnosis not present

## 2018-04-09 DIAGNOSIS — I10 Essential (primary) hypertension: Secondary | ICD-10-CM | POA: Diagnosis not present

## 2018-04-09 DIAGNOSIS — Z952 Presence of prosthetic heart valve: Secondary | ICD-10-CM | POA: Insufficient documentation

## 2018-04-09 DIAGNOSIS — N393 Stress incontinence (female) (male): Secondary | ICD-10-CM | POA: Insufficient documentation

## 2018-04-09 DIAGNOSIS — Z6841 Body Mass Index (BMI) 40.0 and over, adult: Secondary | ICD-10-CM | POA: Diagnosis not present

## 2018-04-09 DIAGNOSIS — R590 Localized enlarged lymph nodes: Secondary | ICD-10-CM

## 2018-04-09 DIAGNOSIS — R918 Other nonspecific abnormal finding of lung field: Secondary | ICD-10-CM

## 2018-04-09 DIAGNOSIS — E785 Hyperlipidemia, unspecified: Secondary | ICD-10-CM | POA: Insufficient documentation

## 2018-04-09 DIAGNOSIS — I499 Cardiac arrhythmia, unspecified: Secondary | ICD-10-CM | POA: Diagnosis not present

## 2018-04-09 DIAGNOSIS — C541 Malignant neoplasm of endometrium: Secondary | ICD-10-CM

## 2018-04-09 DIAGNOSIS — F329 Major depressive disorder, single episode, unspecified: Secondary | ICD-10-CM | POA: Insufficient documentation

## 2018-04-09 DIAGNOSIS — Z923 Personal history of irradiation: Secondary | ICD-10-CM | POA: Diagnosis not present

## 2018-04-09 DIAGNOSIS — I350 Nonrheumatic aortic (valve) stenosis: Secondary | ICD-10-CM | POA: Diagnosis not present

## 2018-04-09 NOTE — Progress Notes (Signed)
Gynecologic Oncology Interval Visit   Referring Provider: Will Bonnet,  MD  Chief Concern: Grade 1 endometrial cancer  Subjective:  Melody Soto is a 68 y.o. female G2P2, initially seen in consultation for Dr. 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 She is s/p prior XL ovarian cystectomy for benign disease. She returns to clinic today for surveillance.   Seen in clinic by Dr. Theora Gianotti on 10/09/2017. NED at that time.   She continues to be followed by lymphedema clinic. She has seen Dr. Erlene Quan for urinary incontinence and start myrbetriq.   She was seen by Dr. Baruch Gouty for follow up on 04/04/2018. She had noticed some vaginal spotting and CT C/A/P has been scheduled for 04/11/2018. She presents today for a pelvic exam due to concern about bleeding.    Gynecologic Oncology History Melody Soto is a pleasant female G2P2 s/p prior XL ovarian cystectomy for benign disease who is seen in consultation from Dr. Glennon Mac for grade 1 endometrial cancer.  10/17/2016 Endometrial biopsy and biopsy of cervical polyp  Part A: ENDOMETRIUM, BIOPSY:  WELL DIFFERENTIATED ENDOMETRIAL ADENOCARCINOMA (FIGO I). COMMENT: THIS CASE WAS REVIEWED IN INTRADEPARTMENTAL CONSULTATION AND THE DIAGNOSIS REFLECTS OUR CONSENSUS OPINION.DR,JACKSON'S OFFICE WAS INFORMED OF DIAGNOSIS ON 10/19/16 AT 1.10 PM.  Part B: ENDOCERVIX, POLYP:  MINUTE FRAGMENTS OFOF ADENOCARCINOMA PROBABLY FROM ENDOMETRIUM.  MINUTE FRAGMENTS OF BENIGN ENDOCERVICAL GLANDS, MUCUS, AND BLOOD.  Pap unsatisfactory  10/29/2017 Pelvic ultrasound Uterus 7.3 x 5 x 5 cm with small leiomyomas Bilateral ovaries normal. Endometrial stripe 7.1 mm  CT C/A/P 11/20/2016 IMPRESSION: 1. There is a mildly enlarged right hilar lymph node at 1.1 cm, neck given the that there are no other indicators of metastatic disease, this is likely incidental. No worrisome pulmonary nodules identified. 2. The endometrium is  indistinct. Uterine and adnexal contours relatively normal. 3. Asymmetric glandular tissues in the right upper breast compared to the left. Asymmetry is often normal, but mammographic correlation is suggested. 4. Other imaging findings of potential clinical significance: Aortic Atherosclerosis (ICD10-I70.0). Calcified aortic valve with faint calcification of the mitral valve. Cholelithiasis. Small type 1 hiatal hernia. Descending colon diverticulosis.  She was not a candidate for surgical management due to cardiac disease. She underwent primary radiation.   She underwent WPRT (45 Gy over 5 weeks) with Dr. Baruch Gouty at [facility] followed by vaginal cuff brachytherapy with Dr. Christel Mormon at [facility]. She completed high-dose rate Brachytherapy to the cervix (treatment 5 of planned 5) on 04/02/2017.  04/16/3017 Mammogram negative   05/09/3017 CT chest wo contrast IMPRESSION: 1. Stable small scattered mediastinal and hilar lymph nodes. No mass or adenopathy. No new or progressive findings. 2. Stable small pulmonary nodules. Recommend follow-up noncontrast chest CT in 1 year. 3. Stable mild emphysematous changes.  07/31/3017 CT Abdomen Pelvis W Contrast IMPRESSION: Negative. No evidence of recurrent or metastatic carcinoma within the abdomen or pelvis. Cholelithiasis.  No radiographic evidence of cholecystitis. Mild hepatic steatosis. Colonic diverticulosis, without radiographic evidence of diverticulitis.  She had episode of right pelvic, supra-pubic pain which resolved spontaneously. She noticed episode of 'pink-tinged' discharge or urine' around the same time which has resolved. She has incontinence of urine, worse with bladder fullness, coughing since completing radiation. She continues to be followed by the Lymphedema clinic for chronic lower extremity lymphedema. She has been purposefully losing weight and estimates ~ 30 lb weight loss by counting calories and increasing activity. She has recovered well post-TAVR and  reports improved energy levels. She  uses the vaginal dilator sporadically.   Problem List: Patient Active Problem List   Diagnosis Date Noted  . Other neutropenia ([place]) 07/15/2017  . Endometrial cancer ([place]) 04/10/2017  . 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 Chronic acquired lymphedema 01/08/2017  . Obesity, morbid, BMI 40.0-49.9 ([place]) 11/13/2016  . FIGO stage II endometrial cancer ([place]) 10/29/2016  . Left ventricular hypertrophy 11/27/2014  . Lymphedema of lower extremity 08/23/2014  . Severe aortic stenosis 08/21/2014  . Benign essential HTN 08/21/2014  . Depression, major, recurrent, mild ([place]) 08/21/2014  . Dyslipidemia 08/21/2014  . Cardiac dysrhythmia 08/21/2014  . Dysmetabolic syndrome 03/54/6568  . Symptomatic menopausal or female climacteric states 08/21/2014  . 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 History of shingles 08/21/2014  . Personal history of skin cancer 11/24/2009    Past Medical History: Past Medical History:  Diagnosis Date  . Aortic stenosis   . Cataract   . Depression   . Endometrial cancer determined by uterine biopsy ([place]) 10/29/2016  . Heart murmur    HX OF  . Hyperlipidemia   . Hypertension    CONTROLLED ON MEDS  . Lymphedema   . Shortness of breath dyspnea     Past Surgical History: Past Surgical History:  Procedure Laterality Date  . 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 cataract surgery Bilateral 05/24/2014   second eye 06/07/2014  . MELANOMA EXCISION    . OVARIAN CYST REMOVAL     EXPLORATORY LAPAROTOMY  . TONSILLECTOMY      Past Gynecologic History:  Last Menstrual Period:  2016; postmenopausal Last pap: 2018   OB History:  OB History  Gravida Para Term Preterm AB Living  2 2       2   SAB TAB Ectopic Multiple Live Births               # Outcome Date GA Lbr Len/2nd Weight Sex Delivery Anes PTL Lv  2 Para           1 Para             Obstetric Comments  7 pregancies; 2  NSVDs    Family History: Family History  Problem Relation Age of Onset  . Cancer Mother   . Stroke Mother   . Thyroid nodules Mother   . Diabetes Sister   . Hypertension Sister   . Breast cancer Maternal Aunt   . Bone cancer Maternal Uncle   . Depression Daughter   . Depression Daughter     Social History: Social History   Socioeconomic History  . Marital status: Divorced    Spouse name: Not on file  . Number of children: 2  . Years of education: Not on file  . Highest education level: Bachelor's degree (e.g., BA, AB, BS)  Occupational History  . Not on file  Social Needs  . Financial resource strain: Hard  . Food insecurity:    Worry: Never true    Inability: Never true  . Transportation needs:    Medical: No    Non-medical: No  Tobacco Use  . Smoking status: Never Smoker  . Smokeless tobacco: Never Used  Substance and Sexual Activity  . Alcohol use: Yes    Alcohol/week: 0.0 standard drinks    Comment: Minimal alcohol consumption WINE   . Drug use: No  . Sexual activity: Not Currently    Birth control/protection: Post-menopausal  Lifestyle  . Physical activity:    Days per week: 0 days  Minutes per session: 0 min  . Stress: Not at [place]  Relationships  . Social connections:    Talks on phone: More than three times a week    Gets together: Twice a week    Attends religious service: More than 4 times per year    Active member of club or organization: Yes    Attends meetings of clubs or organizations: More than 4 times per year    Relationship status: Divorced  . Intimate partner violence:    Fear of current or ex partner: No    Emotionally abused: No    Physically abused: No    Forced sexual activity: No  Other Topics Concern  . Not on file  Social History Narrative  . Not on file    Allergies: Allergies  Allergen Reactions  . Ace Inhibitors Other (See Comments)    UNKNOWN REACTION  . Duloxetine     drowsiness    Current  Medications: Current Outpatient Medications  Medication Sig Dispense Refill  . aspirin 81 MG chewable tablet Chew by mouth. PM    . Calcium Carb-Cholecalciferol (CALCIUM 500 + D3) 500-200 MG-UNIT TABS Take 1 capsule by mouth 2 (two) times daily.    . Coenzyme Q10 (COQ10) 50 MG CAPS Take 1 tablet by mouth.     . magnesium oxide (MAG-OX) 400 MG tablet Take 1 tablet by mouth 2 (two) times daily.    . metoprolol tartrate (LOPRESSOR) 25 MG tablet Take 1 tablet (25 mg total) by mouth 2 (two) times daily. 180 tablet 1  . mirabegron ER (MYRBETRIQ) 25 MG TB24 tablet Take 1 tablet (25 mg total) by mouth daily. 30 tablet 3  . MULTIPLE VITAMIN PO Take 2 tablets by mouth 2 (two) times daily. Reported on 05/19/2015    . pravastatin (PRAVACHOL) 20 MG tablet Take 1 tablet (20 mg total) by mouth daily. 90 tablet 0  . telmisartan (MICARDIS) 40 MG tablet Take 1 tablet (40 mg total) by mouth daily. 90 tablet 1  . vitamin B-12 (CYANOCOBALAMIN) 1000 MCG tablet Take 1,000 mcg by mouth daily.      No current facility-administered medications for this visit.    Review of Systems General:  no complaints Skin: no complaints Eyes: no complaints HEENT: no complaints Breasts: no complaints Pulmonary: no complaints Cardiac: no complaints Extremities: chronic BLE since her 10s Gastrointestinal: no complaints Genitourinary/Sexual: bladder issues since brachytherapy Gyn: lower right abdominal/pelvic pain and pink vaginal discharge.  Musculoskeletal: no complaints Hematology: no complaints Neurologic/Psych: depression   Objective:  Physical Examination:  There were no vitals taken for this visit.   ECOG Performance Status: 1 - Symptomatic but completely ambulatory  GENERAL: Patient is a well appearing female in no acute distress HEENT:  Sclerae anicteric.  Oropharynx clear and moist. No ulcerations or evidence of oropharyngeal candidiasis. Neck is supple.  ABDOMEN:  Soft, nontender.  Obese. Vertical incision  well healed w/o hernia, mass, or ascites. No hepatosplenomegaly.  EXTREMITIES:  BLE edema- compression stockings in place.  NEURO:  Nonfocal. Well oriented.  Appropriate affect.  Pelvic: Exam Chaperoned by Nurse:  Vulva: normal appearing vulva without masses, tenderness, or lesions. Vagina: normal vagina. Cervix: normal appearing there is a small erythematous area at 6 o'clock. Adnexa: unable to palpate. Uterus: not grossly enlarged. Nontender. Exam limited d/t body habitus. Rectal: deferred  Lab Review No labs performed on site today   Radiologic Imaging: No imaging performed on site today    Assessment:  Melody Soto is  a 68 y.o. female diagnosed with possible stage II grade 1 endometrial cancer with involvement of a cervical polyp s/p primary radiation therapy. NED on exam, however, spotting worrisome for disease recurrence.    Stable small pulmonary nodules on imaging - repeat CT chest 04/2018  Enlarged hilar node - no evidence of progression on imaging.   Asymmetric breast tissue - negative mammogram.   S/P TAVR (transcatheter aortic valve replacement)   Medical co-morbidities complicating care: left ventricular hypertrophy, severe aortic stenosis, cardiac dysrhythmia, hyperlipidemia,, HTN and obesity.    Plan:   Problem List Items Addressed This Visit      Other   FIGO stage II endometrial cancer ([place]) - Primary      Ms. Mcavoy has possible stage II endometrial cancer, grade 1 endometrioid, with cervical involvement. She has several significant comorbidities and was treated with primary radiation therapy. NED on exam but spotting concerning for recurrence.  CT scan already ordered and pending for 04/11/2018. We will determine our recommendations for additional evaluation and testing based on findings. If CT negative plan for pelvic US to assess endometrial stripe.    A total of 15  minutes were spent with the patient/family today; >50% was spent in education,  counseling and coordination of care for endometrial cancer.     Verlon Au, NP, scribe  I personally saw and evaluated the patient, provided cousneling and determined the management and treatment plan as documented.  ANGELES Gaetana Michaelis, MD    CC:  Will Bonnet,  MD

## 2018-04-09 NOTE — Progress Notes (Signed)
Here for follow up. Stated last Tuesday she started spotting , brown then reddish tinge to d/c.  Spotting  last Tuesday 2/18 -lasted 24 h . No further spotting per pt.

## 2018-04-11 ENCOUNTER — Ambulatory Visit
Admission: RE | Admit: 2018-04-11 | Discharge: 2018-04-11 | Disposition: A | Discharge status: Home / Self Care | Payer: Medicare HMO | Source: Ambulatory Visit | Attending: Radiation Oncology | Admitting: Radiation Oncology

## 2018-04-11 DIAGNOSIS — C541 Malignant neoplasm of endometrium: Secondary | ICD-10-CM

## 2018-04-11 DIAGNOSIS — R918 Other nonspecific abnormal finding of lung field: Secondary | ICD-10-CM | POA: Diagnosis not present

## 2018-04-11 LAB — POCT I-STAT CREATININE: CREATININE: 0.6 mg/dL (ref 0.44–1.00)

## 2018-04-11 MED ORDER — IOPAMIDOL (ISOVUE-370) INJECTION 76%
100.0000 mL | Freq: Once | INTRAVENOUS | Status: AC | PRN
  Administered 2018-04-11: 100 mL via INTRAVENOUS

## 2018-04-14 ENCOUNTER — Telehealth: Payer: Self-pay

## 2018-04-14 DIAGNOSIS — C541 Malignant neoplasm of endometrium: Secondary | ICD-10-CM

## 2018-04-14 NOTE — Telephone Encounter (Signed)
Called and notified with negative CT results on named voicemail. Per Dr. Theora Gianotti, we will proceed with U/S. U/S ordered and scheduled. Appointment will generate on my chart. Encouraged Ms. Steinhart to call me with questions regarding CT /US.

## 2018-04-15 ENCOUNTER — Ambulatory Visit: Payer: Medicare HMO | Admitting: Urology

## 2018-04-21 ENCOUNTER — Ambulatory Visit
Admission: RE | Admit: 2018-04-21 | Discharge: 2018-04-21 | Disposition: A | Discharge status: Home / Self Care | Payer: Medicare HMO | Source: Ambulatory Visit | Attending: Obstetrics and Gynecology | Admitting: Obstetrics and Gynecology

## 2018-04-21 ENCOUNTER — Ambulatory Visit: Payer: Medicare HMO

## 2018-04-21 ENCOUNTER — Other Ambulatory Visit: Payer: Self-pay

## 2018-04-21 DIAGNOSIS — C541 Malignant neoplasm of endometrium: Secondary | ICD-10-CM | POA: Diagnosis not present

## 2018-04-23 ENCOUNTER — Ambulatory Visit: Payer: Medicare HMO

## 2018-04-23 ENCOUNTER — Inpatient Hospital Stay: Payer: Medicare HMO | Attending: Obstetrics and Gynecology | Admitting: Obstetrics and Gynecology

## 2018-04-23 ENCOUNTER — Other Ambulatory Visit: Payer: Self-pay

## 2018-04-23 ENCOUNTER — Other Ambulatory Visit: Payer: Self-pay | Admitting: Nurse Practitioner

## 2018-04-23 VITALS — BP 153/72 | HR 76 | Temp 98.6°F | Resp 18 | Wt 264.0 lb

## 2018-04-23 DIAGNOSIS — Z6841 Body Mass Index (BMI) 40.0 and over, adult: Secondary | ICD-10-CM | POA: Insufficient documentation

## 2018-04-23 DIAGNOSIS — Z79899 Other long term (current) drug therapy: Secondary | ICD-10-CM | POA: Diagnosis not present

## 2018-04-23 DIAGNOSIS — I10 Essential (primary) hypertension: Secondary | ICD-10-CM | POA: Insufficient documentation

## 2018-04-23 DIAGNOSIS — N939 Abnormal uterine and vaginal bleeding, unspecified: Secondary | ICD-10-CM | POA: Insufficient documentation

## 2018-04-23 DIAGNOSIS — I350 Nonrheumatic aortic (valve) stenosis: Secondary | ICD-10-CM | POA: Insufficient documentation

## 2018-04-23 DIAGNOSIS — C541 Malignant neoplasm of endometrium: Secondary | ICD-10-CM | POA: Insufficient documentation

## 2018-04-23 DIAGNOSIS — I499 Cardiac arrhythmia, unspecified: Secondary | ICD-10-CM | POA: Diagnosis not present

## 2018-04-23 DIAGNOSIS — I890 Lymphedema, not elsewhere classified: Secondary | ICD-10-CM | POA: Insufficient documentation

## 2018-04-23 DIAGNOSIS — Z952 Presence of prosthetic heart valve: Secondary | ICD-10-CM | POA: Insufficient documentation

## 2018-04-23 DIAGNOSIS — E785 Hyperlipidemia, unspecified: Secondary | ICD-10-CM | POA: Insufficient documentation

## 2018-04-23 DIAGNOSIS — R918 Other nonspecific abnormal finding of lung field: Secondary | ICD-10-CM | POA: Diagnosis not present

## 2018-04-23 DIAGNOSIS — I517 Cardiomegaly: Secondary | ICD-10-CM | POA: Insufficient documentation

## 2018-04-23 DIAGNOSIS — Z923 Personal history of irradiation: Secondary | ICD-10-CM | POA: Diagnosis not present

## 2018-04-23 NOTE — Patient Instructions (Addendum)

## 2018-04-23 NOTE — Progress Notes (Signed)
Here for follow up. Overall " im fine " -per pt anxious for testing results .

## 2018-04-23 NOTE — Progress Notes (Signed)
Gynecologic Oncology Interval Visit   Referring Provider: Will Bonnet,  MD  Chief Concern: Grade 1 endometrial cancer  Subjective:  Melody Soto is a 68 y.o. female G2P2, initially seen in consultation for Dr. 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 She is s/p prior XL ovarian cystectomy for benign disease. She returns to clinic today for review of imaging.   Seen in clinic by Dr. Theora Gianotti on 04/09/2018 with complaint of bleeding. Imaging unremarkable other than EMS of 8 mm. She presents for EMBx today. She has not had any further bleeding.     04/21/2018 Pelvic US Uterus: Measurements: 5.9 x 4.3 x 5.3 cm = volume: 69.5 mL. 11 x 10 x 18 mm subserosal fibroid in the anterior lower uterine segment. Endometrium Thickness: 8 mm.  No focal abnormality visualized. Bilateral ovaries: No adnexal mass is seen.  04/11/2018 CT C/A.P IMPRESSION: 1. Stable exam. No new or progressive finding since prior chest CT and abdomen/pelvis CT. 2. Multiple bilateral pulmonary nodules measuring 5 mm less, stable in the 1 year interval since 05/09/2017 suggesting benign etiology. Continued attention on follow-up recommended 3. Cholelithiasis. 4. Colonic diverticulosis without diverticulitis.   Gynecologic Oncology History Melody Soto is a pleasant female G2P2 s/p prior XL ovarian cystectomy for benign disease who is seen in consultation from Dr. Glennon Mac for grade 1 endometrial cancer.  10/17/2016 Endometrial biopsy and biopsy of cervical polyp  Part A: ENDOMETRIUM, BIOPSY:  WELL DIFFERENTIATED ENDOMETRIAL ADENOCARCINOMA (FIGO I). COMMENT: THIS CASE WAS REVIEWED IN INTRADEPARTMENTAL CONSULTATION AND THE DIAGNOSIS REFLECTS OUR CONSENSUS OPINION.DR,JACKSON'S OFFICE WAS INFORMED OF DIAGNOSIS ON 10/19/16 AT 1.10 PM.  Part B: ENDOCERVIX, POLYP:  MINUTE FRAGMENTS OFOF ADENOCARCINOMA PROBABLY FROM ENDOMETRIUM.  MINUTE FRAGMENTS OF BENIGN ENDOCERVICAL  GLANDS, MUCUS, AND BLOOD.  Pap unsatisfactory  10/29/2017 Pelvic ultrasound Uterus 7.3 x 5 x 5 cm with small leiomyomas Bilateral ovaries normal. Endometrial stripe 7.1 mm  CT C/A/P 11/20/2016 IMPRESSION: 1. There is a mildly enlarged right hilar lymph node at 1.1 cm, neck given the that there are no other indicators of metastatic disease, this is likely incidental. No worrisome pulmonary nodules identified. 2. The endometrium is indistinct. Uterine and adnexal contours relatively normal. 3. Asymmetric glandular tissues in the right upper breast compared to the left. Asymmetry is often normal, but mammographic correlation is suggested. 4. Other imaging findings of potential clinical significance: Aortic Atherosclerosis (ICD10-I70.0). Calcified aortic valve with faint calcification of the mitral valve. Cholelithiasis. Small type 1 hiatal hernia. Descending colon diverticulosis.  She was not a candidate for surgical management due to cardiac disease. She underwent primary radiation.   She underwent WPRT (45 Gy over 5 weeks) with Dr. Baruch Gouty at [facility] followed by vaginal cuff brachytherapy with Dr. Christel Mormon at [facility]. She completed high-dose rate Brachytherapy to the cervix (treatment 5 of planned 5) on 04/02/2017.  04/16/3017 Mammogram negative   05/09/3017 CT chest wo contrast IMPRESSION: 1. Stable small scattered mediastinal and hilar lymph nodes. No mass or adenopathy. No new or progressive findings. 2. Stable small pulmonary nodules. Recommend follow-up noncontrast chest CT in 1 year. 3. Stable mild emphysematous changes.  07/31/3017 CT Abdomen Pelvis W Contrast IMPRESSION: Negative. No evidence of recurrent or metastatic carcinoma within the abdomen or pelvis. Cholelithiasis.  No radiographic evidence of cholecystitis. Mild hepatic steatosis. Colonic diverticulosis, without radiographic evidence of diverticulitis.  She had episode of right pelvic, supra-pubic pain which resolved spontaneously. She  noticed episode of 'pink-tinged' discharge or urine' around the  same time which has resolved. She has incontinence of urine, worse with bladder fullness, coughing since completing radiation. She continues to be followed by the Lymphedema clinic for chronic lower extremity lymphedema. She has been purposefully losing weight and estimates ~ 30 lb weight loss by counting calories and increasing activity. She has recovered well post-TAVR and reports improved energy levels. She uses the vaginal dilator sporadically.   Problem List: Patient Active Problem List   Diagnosis Date Noted  . Other neutropenia ([place]) 07/15/2017  . Endometrial cancer ([place]) 04/10/2017  . 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 Chronic acquired lymphedema 01/08/2017  . Obesity, morbid, BMI 40.0-49.9 ([place]) 11/13/2016  . FIGO stage II endometrial cancer ([place]) 10/29/2016  . Left ventricular hypertrophy 11/27/2014  . Lymphedema of lower extremity 08/23/2014  . Severe aortic stenosis 08/21/2014  . Benign essential HTN 08/21/2014  . Depression, major, recurrent, mild ([place]) 08/21/2014  . Dyslipidemia 08/21/2014  . Cardiac dysrhythmia 08/21/2014  . Dysmetabolic syndrome 09/62/8366  . Symptomatic menopausal or female climacteric states 08/21/2014  . 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 History of shingles 08/21/2014  . Personal history of skin cancer 11/24/2009    Past Medical History: Past Medical History:  Diagnosis Date  . Aortic stenosis   . Cataract   . Depression   . Endometrial cancer determined by uterine biopsy ([place]) 10/29/2016   Rad tx's and internal brachytherapy.  Marland Kitchen Heart murmur    HX OF  . Hyperlipidemia   . Hypertension    CONTROLLED ON MEDS  . Lymphedema   . Shortness of breath dyspnea     Past Surgical History: Past Surgical History:  Procedure Laterality Date  . 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 cataract surgery Bilateral 05/24/2014   second eye  06/07/2014  . MELANOMA EXCISION    . OVARIAN CYST REMOVAL     EXPLORATORY LAPAROTOMY  . TONSILLECTOMY      Past Gynecologic History:  Last Menstrual Period:  2016; postmenopausal Last pap: 2018   OB History:  OB History  Gravida Para Term Preterm AB Living  2 2       2   SAB TAB Ectopic Multiple Live Births               # Outcome Date GA Lbr Len/2nd Weight Sex Delivery Anes PTL Lv  2 Para           1 Para             Obstetric Comments  7 pregancies; 2 NSVDs    Family History: Family History  Problem Relation Age of Onset  . Cancer Mother   . Stroke Mother   . Thyroid nodules Mother   . Diabetes Sister   . Hypertension Sister   . Breast cancer Maternal Aunt   . Bone cancer Maternal Uncle   . Depression Daughter   . Depression Daughter     Social History: Social History   Socioeconomic History  . Marital status: Divorced    Spouse name: Not on file  . Number of children: 2  . Years of education: Not on file  . Highest education level: Bachelor's degree (e.g., BA, AB, BS)  Occupational History  . Not on file  Social Needs  . Financial resource strain: Hard  . Food insecurity:    Worry: Never true    Inability: Never true  . Transportation needs:    Medical: No    Non-medical: No  Tobacco Use  .  Smoking status: Never Smoker  . Smokeless tobacco: Never Used  Substance and Sexual Activity  . Alcohol use: Yes    Alcohol/week: 0.0 standard drinks    Comment: Minimal alcohol consumption WINE   . Drug use: No  . Sexual activity: Not Currently    Birth control/protection: Post-menopausal  Lifestyle  . Physical activity:    Days per week: 0 days    Minutes per session: 0 min  . Stress: Not at all  Relationships  . Social connections:    Talks on phone: More than three times a week    Gets together: Twice a week    Attends religious service: More than 4 times per year    Active member of club or organization: Yes    Attends meetings of clubs or  organizations: More than 4 times per year    Relationship status: Divorced  . Intimate partner violence:    Fear of current or ex partner: No    Emotionally abused: No    Physically abused: No    Forced sexual activity: No  Other Topics Concern  . Not on file  Social History Narrative  . Not on file    Allergies: Allergies  Allergen Reactions  . Ace Inhibitors Other (See Comments)    UNKNOWN REACTION  . Duloxetine     drowsiness    Current Medications: Current Outpatient Medications  Medication Sig Dispense Refill  . aspirin 81 MG chewable tablet Chew by mouth. PM    . Calcium Carb-Cholecalciferol (CALCIUM 500 + D3) 500-200 MG-UNIT TABS Take 1 capsule by mouth 2 (two) times daily.    . Coenzyme Q10 (COQ10) 50 MG CAPS Take 1 tablet by mouth.     . magnesium oxide (MAG-OX) 400 MG tablet Take 1 tablet by mouth 2 (two) times daily.    . metoprolol tartrate (LOPRESSOR) 25 MG tablet Take 1 tablet (25 mg total) by mouth 2 (two) times daily. 180 tablet 1  . MULTIPLE VITAMIN PO Take 2 tablets by mouth 2 (two) times daily. Reported on 05/19/2015    . pravastatin (PRAVACHOL) 20 MG tablet Take 1 tablet (20 mg total) by mouth daily. 90 tablet 0  . telmisartan (MICARDIS) 40 MG tablet Take 1 tablet (40 mg total) by mouth daily. 90 tablet 1  . vitamin B-12 (CYANOCOBALAMIN) 1000 MCG tablet Take 1,000 mcg by mouth daily.      No current facility-administered medications for this visit.    Review of Systems General:  no complaints Skin: no complaints Eyes: no complaints HEENT: no complaints Breasts: no complaints Pulmonary: no complaints Cardiac: no complaints Extremities: chronic BLE since her 79s Gastrointestinal: no complaints Genitourinary/Sexual: bladder issues since brachytherapy Gyn: no complaints Musculoskeletal: no complaints Hematology: no complaints Neurologic/Psych: depression   Objective:  Physical Examination:  BP (!) 153/72 (BP Location: Left Arm, Patient Position:  Sitting)   Pulse 76   Temp 98.6 F (37 C) (Tympanic)   Resp 18   Wt 264 lb (119.7 kg)   BMI 40.14 kg/m    ECOG Performance Status: 1 - Symptomatic but completely ambulatory Pelvic: Exam Chaperoned by Nurse:  Vulva: normal appearing vulva without masses, tenderness, or lesions. Vagina: normal vagina. Cervix: normal appearing there no erythema noted. Adnexa: unable to palpate. Uterus: not grossly enlarged. Nontender. Exam limited d/t body habitus. Rectal: deferred  EMBx performed.   The risks and benefits of the procedure were reviewed and informed consent obtained. Time out was performed. The patient received pre-procedure teaching and  expressed understanding. The post-procedure instructions were reviewed with the patient and she expressed understanding. The patient does not have any barriers to learning.  Speculum placed in the vaginal vault and cervix identified. Cervix cleansed with Betadine and anterior lip grasped with a tenaculum. The internal os was stenotic and would not allow the pipelle to advance. The os was dilated with a small dilator and the pipelle was inserted to 7 cm for two passes. The specimen was obtained without difficult - scant tissue was present. The tenaculum was removed and hemostasis excellent.  Post-procedure evaluation the patient was stable without complaints.     Assessment:  Melody Soto is a 68 y.o. female diagnosed with possible stage II grade 1 endometrial cancer with involvement of a cervical polyp s/p primary radiation therapy. NED on exam, however, spotting worrisome for disease recurrence.  Imaging unremarkable other than thickened EMS. EMBx obtained with scan tissue.    Stable small pulmonary nodules on imaging - repeat CT chest 04/2018  Enlarged hilar node - no evidence of progression on imaging.   Asymmetric breast tissue - negative mammogram.   S/P TAVR (transcatheter aortic valve replacement)   Medical co-morbidities complicating care:  left ventricular hypertrophy, severe aortic stenosis, cardiac dysrhythmia, hyperlipidemia,, HTN and obesity.    Plan:   Problem List Items Addressed This Visit      Genitourinary   Endometrial cancer [facility]) - Primary      Ms. Tep has possible stage II endometrial cancer, grade 1 endometrioid, with cervical involvement. She has several significant comorbidities and was treated with primary radiation therapy. NED on exam but spotting concerning for recurrence.  Imaging unremarkable other than thickened EMS.   Follow up EMBx and determine management based on these results.   Return to clinic in 3 months for further evaluation if work up negative.   I personally saw and evaluated the patient, provided cousneling and determined the management and treatment plan as documented.  Yasuo Phimmasone Gaetana Michaelis, MD    CC:  Will Bonnet,  MD

## 2018-04-25 ENCOUNTER — Telehealth: Payer: Self-pay | Admitting: Nurse Practitioner

## 2018-04-25 LAB — SURGICAL PATHOLOGY

## 2018-04-25 NOTE — Telephone Encounter (Signed)
Called patient. She has recovered well from her biopsy. Shared and discussed results. Follow up is scheduled.   DIAGNOSIS:  A. ENDOMETRIUM; BIOPSY:  - EXTREMELY SCANT BENIGN SQUAMOUS AND ENDOCERVICAL EPITHELIUM.  - NO ENDOMETRIAL TISSUE IS PRESENT.

## 2018-04-30 ENCOUNTER — Other Ambulatory Visit: Payer: Self-pay | Admitting: Nurse Practitioner

## 2018-04-30 DIAGNOSIS — C541 Malignant neoplasm of endometrium: Secondary | ICD-10-CM

## 2018-04-30 NOTE — Progress Notes (Signed)
Discussed results with Dr. Theora Gianotti who recommends repeating pelvic ultrasound in 3 months to reassess endometrial stripe. Advised patient of rationale for imaging by phone. Scheduling message sent to schedule scan.

## 2018-05-28 ENCOUNTER — Telehealth: Payer: Self-pay | Admitting: Nurse Practitioner

## 2018-05-28 NOTE — Telephone Encounter (Signed)
Spoke to patient by phone. Endometrial biopsy did not reveal endometrial tissue and upon review, Dr. Theora Gianotti would like to repeat ultrasound in 2 months as opposed to previously scheduled for 3 months.  Discussed with patient who agrees with plan.  Will send message to have images rescheduled will follow up with patient's based on results.

## 2018-06-17 ENCOUNTER — Ambulatory Visit
Admission: RE | Admit: 2018-06-17 | Discharge: 2018-06-17 | Disposition: A | Discharge status: Home / Self Care | Payer: Medicare HMO | Source: Ambulatory Visit | Attending: Nurse Practitioner | Admitting: Nurse Practitioner

## 2018-06-17 ENCOUNTER — Other Ambulatory Visit: Payer: Self-pay

## 2018-06-17 DIAGNOSIS — C541 Malignant neoplasm of endometrium: Secondary | ICD-10-CM | POA: Diagnosis not present

## 2018-06-18 ENCOUNTER — Ambulatory Visit: Payer: Medicare HMO

## 2018-06-25 ENCOUNTER — Telehealth: Payer: Self-pay | Admitting: Nurse Practitioner

## 2018-06-25 NOTE — Telephone Encounter (Signed)
Called patient to discuss results of ultrasound which was reassuring. She is not having any bleeding. Dr. Theora Gianotti reviewed results and requests that if patient is asymptomatic, no bleeding, to have her return to clinic as scheduled in June for follow-up and we will consider repeat ultrasound at that time. Patient has reduced aspirin use to 3 times a week to see if that improves her bleeding which was ok'd per PCP.

## 2018-06-30 ENCOUNTER — Other Ambulatory Visit: Payer: Self-pay | Admitting: Family Medicine

## 2018-06-30 ENCOUNTER — Encounter: Payer: Self-pay | Admitting: Family Medicine

## 2018-06-30 DIAGNOSIS — I10 Essential (primary) hypertension: Secondary | ICD-10-CM

## 2018-06-30 MED ORDER — METOPROLOL TARTRATE 25 MG PO TABS: 25.0000 mg | ORAL_TABLET | Freq: Two times a day (BID) | ORAL | 1 refills | Status: DC

## 2018-07-11 ENCOUNTER — Other Ambulatory Visit: Payer: Self-pay | Admitting: Family Medicine

## 2018-07-11 DIAGNOSIS — I10 Essential (primary) hypertension: Secondary | ICD-10-CM

## 2018-07-15 ENCOUNTER — Ambulatory Visit: Payer: Medicare HMO

## 2018-07-23 ENCOUNTER — Inpatient Hospital Stay: Payer: Medicare HMO

## 2018-08-05 ENCOUNTER — Ambulatory Visit (INDEPENDENT_AMBULATORY_CARE_PROVIDER_SITE_OTHER): Payer: Medicare HMO

## 2018-08-05 VITALS — BP 132/67 | HR 70 | Ht 68.0 in | Wt 270.0 lb

## 2018-08-05 DIAGNOSIS — Z1231 Encounter for screening mammogram for malignant neoplasm of breast: Secondary | ICD-10-CM | POA: Diagnosis not present

## 2018-08-05 DIAGNOSIS — Z598 Other problems related to housing and economic circumstances: Secondary | ICD-10-CM

## 2018-08-05 DIAGNOSIS — Z0000 Encounter for general adult medical examination without abnormal findings: Secondary | ICD-10-CM | POA: Diagnosis not present

## 2018-08-05 DIAGNOSIS — Z599 Problem related to housing and economic circumstances, unspecified: Secondary | ICD-10-CM

## 2018-08-05 NOTE — Patient Instructions (Signed)
Melody Soto , Thank you for taking time to come for your Medicare Wellness Visit. I appreciate your ongoing commitment to your health goals. Please review the following plan we discussed and let me know if I can assist you in the future.   Screening recommendations/referrals: Colonoscopy: Cologuard done 11/25/15.  Mammogram: done 04/16/17. Please call [phone] to schedule your mammogram.  Bone Density: done 08/20/17 Recommended yearly ophthalmology/optometry visit for glaucoma screening and checkup Recommended yearly dental visit for hygiene and checkup  Vaccinations: Influenza vaccine: done 01/29/18 Pneumococcal vaccine: done 03/21/17. Due for second dose.  Tdap vaccine: done 07/14/13 Shingles vaccine: Shingrix discussed. Please contact your pharmacy for coverage information.   Advanced directives: Advance directive discussed with you today. I have mailed a copy for you to complete at home and have notarized. Once this is complete please bring a copy in to our office so we can scan it into your chart.  Conditions/risks identified: Continue healthy eating and increase physical activity for desired weight loss.   Next appointment: Please follow up in one year for your Medicare Annual Wellness visit.     Preventive Care 3 Years and Older, Female Preventive care refers to lifestyle choices and visits with your health care provider that can promote health and wellness. What does preventive care include?  A yearly physical exam. This is also called an annual well check.  Dental exams once or twice a year.  Routine eye exams. Ask your health care provider how often you should have your eyes checked.  Personal lifestyle choices, including:  Daily care of your teeth and gums.  Regular physical activity.  Eating a healthy diet.  Avoiding tobacco and drug use.  Limiting alcohol use.  Practicing safe sex.  Taking low-dose aspirin every day.  Taking vitamin and mineral supplements  as recommended by your health care provider. What happens during an annual well check? The services and screenings done by your health care provider during your annual well check will depend on your age, overall health, lifestyle risk factors, and family history of disease. Counseling  Your health care provider may ask you questions about your:  Alcohol use.  Tobacco use.  Drug use.  Emotional well-being.  Home and relationship well-being.  Sexual activity.  Eating habits.  History of falls.  Memory and ability to understand (cognition).  Work and work Statistician.  Reproductive health. Screening  You may have the following tests or measurements:  Height, weight, and BMI.  Blood pressure.  Lipid and cholesterol levels. These may be checked every 5 years, or more frequently if you are over 57 years old.  Skin check.  Lung cancer screening. You may have this screening every year starting at age 60 if you have a 30-pack-year history of smoking and currently smoke or have quit within the past 15 years.  Fecal occult blood test (FOBT) of the stool. You may have this test every year starting at age 22.  Flexible sigmoidoscopy or colonoscopy. You may have a sigmoidoscopy every 5 years or a colonoscopy every 10 years starting at age 51.  Hepatitis C blood test.  Hepatitis B blood test.  Sexually transmitted disease (STD) testing.  Diabetes screening. This is done by checking your blood sugar (glucose) after you have not eaten for a while (fasting). You may have this done every 1-3 years.  Bone density scan. This is done to screen for osteoporosis. You may have this done starting at age 82.  Mammogram. This may be done every  1-2 years. Talk to your health care provider about how often you should have regular mammograms. Talk with your health care provider about your test results, treatment options, and if necessary, the need for more tests. Vaccines  Your health care  provider may recommend certain vaccines, such as:  Influenza vaccine. This is recommended every year.  Tetanus, diphtheria, and acellular pertussis (Tdap, Td) vaccine. You may need a Td booster every 10 years.  Zoster vaccine. You may need this after age 78.  Pneumococcal 13-valent conjugate (PCV13) vaccine. One dose is recommended after age 69.  Pneumococcal polysaccharide (PPSV23) vaccine. One dose is recommended after age 31. Talk to your health care provider about which screenings and vaccines you need and how often you need them. This information is not intended to replace advice given to you by your health care provider. Make sure you discuss any questions you have with your health care provider. Document Released: 02/25/2015 Document Revised: 10/19/2015 Document Reviewed: 11/30/2014 Elsevier Interactive Patient Education  2017 [place] Prevention in the Home Falls can cause injuries. They can happen to people of all ages. There are many things you can do to make your home safe and to help prevent falls. What can I do on the outside of my home?  Regularly fix the edges of walkways and driveways and fix any cracks.  Remove anything that might make you trip as you walk through a door, such as a raised step or threshold.  Trim any bushes or trees on the path to your home.  Use bright outdoor lighting.  Clear any walking paths of anything that might make someone trip, such as rocks or tools.  Regularly check to see if handrails are loose or broken. Make sure that both sides of any steps have handrails.  Any raised decks and porches should have guardrails on the edges.  Have any leaves, snow, or ice cleared regularly.  Use sand or salt on walking paths during winter.  Clean up any spills in your garage right away. This includes oil or grease spills. What can I do in the bathroom?  Use night lights.  Install grab bars by the toilet and in the tub and shower. Do  not use towel bars as grab bars.  Use non-skid mats or decals in the tub or shower.  If you need to sit down in the shower, use a plastic, non-slip stool.  Keep the floor dry. Clean up any water that spills on the floor as soon as it happens.  Remove soap buildup in the tub or shower regularly.  Attach bath mats securely with double-sided non-slip rug tape.  Do not have throw rugs and other things on the floor that can make you trip. What can I do in the bedroom?  Use night lights.  Make sure that you have a light by your bed that is easy to reach.  Do not use any sheets or blankets that are too big for your bed. They should not hang down onto the floor.  Have a firm chair that has side arms. You can use this for support while you get dressed.  Do not have throw rugs and other things on the floor that can make you trip. What can I do in the kitchen?  Clean up any spills right away.  Avoid walking on wet floors.  Keep items that you use a lot in easy-to-reach places.  If you need to reach something above you, use a strong  step stool that has a grab bar.  Keep electrical cords out of the way.  Do not use floor polish or wax that makes floors slippery. If you must use wax, use non-skid floor wax.  Do not have throw rugs and other things on the floor that can make you trip. What can I do with my stairs?  Do not leave any items on the stairs.  Make sure that there are handrails on both sides of the stairs and use them. Fix handrails that are broken or loose. Make sure that handrails are as long as the stairways.  Check any carpeting to make sure that it is firmly attached to the stairs. Fix any carpet that is loose or worn.  Avoid having throw rugs at the top or bottom of the stairs. If you do have throw rugs, attach them to the floor with carpet tape.  Make sure that you have a light switch at the top of the stairs and the bottom of the stairs. If you do not have them,  ask someone to add them for you. What else can I do to help prevent falls?  Wear shoes that:  Do not have high heels.  Have rubber bottoms.  Are comfortable and fit you well.  Are closed at the toe. Do not wear sandals.  If you use a stepladder:  Make sure that it is fully opened. Do not climb a closed stepladder.  Make sure that both sides of the stepladder are locked into place.  Ask someone to hold it for you, if possible.  Clearly mark and make sure that you can see:  Any grab bars or handrails.  First and last steps.  Where the edge of each step is.  Use tools that help you move around (mobility aids) if they are needed. These include:  Canes.  Walkers.  Scooters.  Crutches.  Turn on the lights when you go into a dark area. Replace any light bulbs as soon as they burn out.  Set up your furniture so you have a clear path. Avoid moving your furniture around.  If any of your floors are uneven, fix them.  If there are any pets around you, be aware of where they are.  Review your medicines with your doctor. Some medicines can make you feel dizzy. This can increase your chance of falling. Ask your doctor what other things that you can do to help prevent falls. This information is not intended to replace advice given to you by your health care provider. Make sure you discuss any questions you have with your health care provider. Document Released: 11/25/2008 Document Revised: 07/07/2015 Document Reviewed: 03/05/2014 Elsevier Interactive Patient Education  2017 [employer].

## 2018-08-05 NOTE — Progress Notes (Signed)
Subjective:   Melody Soto is a 68 y.o. female who presents for an Initial Medicare Annual Wellness Visit.  Virtual Visit via Telephone Note  I connected with [name] on 08/05/18 at  1:30 PM EDT by telephone and verified that I am speaking with the correct person using two identifiers.  Medicare Annual Wellness visit completed telephonically due to Covid-19 pandemic.   Location: Patient: home Provider: office   I discussed the limitations, risks, security and privacy concerns of performing an evaluation and management service by telephone and the availability of in person appointments. The patient expressed understanding and agreed to proceed.  Some vital signs may be absent or patient reported.   Clemetine Marker, LPN    Review of Systems     Cardiac Risk Factors include: advanced age (>42men, >54 women);obesity (BMI >30kg/m2);hypertension;dyslipidemia     Objective:    Today's Vitals   08/05/18 1347  BP: 132/67  Pulse: 70  Weight: 270 lb (122.5 kg)  Height: 5\' 8"  (1.727 m)   Body mass index is 41.05 kg/m.  Advanced Directives 08/05/2018 04/23/2018 04/23/2018 04/09/2018 04/04/2018 10/09/2017 05/01/2017  Does Patient Have a Medical Advance Directive? No Yes Yes Yes No No Yes  Type of Advance Directive - [place];Living will [place];Living will [place];Living will - - [place]  Does patient want to make changes to medical advance directive? - - - - - - No - Patient declined  Copy of [place] in Chart? - No - copy requested No - copy requested No - copy requested - - No - copy requested  Would patient like information on creating a medical advance directive? Yes (MAU/Ambulatory/Procedural Areas - Information given) - - - No - Patient declined No - Patient declined No - Patient declined    Current Medications (verified) Outpatient Encounter Medications as of  08/05/2018  Medication Sig  . aspirin 81 MG chewable tablet Chew 81 mg by mouth. Taking 3 times per week  . b complex vitamins tablet Take 1 tablet by mouth daily.  . Calcium Carb-Cholecalciferol (CALCIUM 500 + D3) 500-200 MG-UNIT TABS Take 1 capsule by mouth 2 (two) times daily.  . Coenzyme Q10 (COQ10) 50 MG CAPS Take 1 tablet by mouth.   . magnesium oxide (MAG-OX) 400 MG tablet Take 1 tablet by mouth 2 (two) times daily.  . metoprolol tartrate (LOPRESSOR) 25 MG tablet Take 1 tablet (25 mg total) by mouth 2 (two) times daily.  . MULTIPLE VITAMIN PO Take 2 tablets by mouth 2 (two) times daily. Reported on 05/19/2015  . pravastatin (PRAVACHOL) 20 MG tablet Take 1 tablet (20 mg total) by mouth daily. (Patient taking differently: Take 20 mg by mouth daily. Taking 3 times per week)  . telmisartan (MICARDIS) 40 MG tablet TAKE 1 TABLET BY MOUTH EVERY DAY  . vitamin B-12 (CYANOCOBALAMIN) 1000 MCG tablet Take 1,000 mcg by mouth daily.    No facility-administered encounter medications on file as of 08/05/2018.     Allergies (verified) Ace inhibitors and Duloxetine   History: Past Medical History:  Diagnosis Date  . Aortic stenosis   . Cataract   . Depression   . Endometrial cancer determined by uterine biopsy ([place]) 10/29/2016   Rad tx's and internal brachytherapy.  Marland Kitchen Heart murmur    HX OF  . Hyperlipidemia   . Hypertension    CONTROLLED ON MEDS  . Lymphedema   . Shortness of  breath dyspnea    Past Surgical History:  Procedure Laterality Date  . 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 cataract surgery Bilateral 05/24/2014   second eye 06/07/2014  . MELANOMA EXCISION    . OVARIAN CYST REMOVAL     EXPLORATORY LAPAROTOMY  . TONSILLECTOMY     Family History  Problem Relation Age of Onset  . Cancer Mother   . Stroke Mother   . Thyroid nodules Mother   . Diabetes Sister   . Hypertension Sister   . Breast cancer Maternal Aunt   . Bone cancer Maternal Uncle   . Depression  Daughter   . Depression Daughter    Social History   Socioeconomic History  . Marital status: Divorced    Spouse name: Not on file  . Number of children: 2  . Years of education: Not on file  . Highest education level: Bachelor's degree (e.g., BA, AB, BS)  Occupational History  . Not on file  Social Needs  . Financial resource strain: Somewhat hard  . Food insecurity    Worry: Never true    Inability: Never true  . Transportation needs    Medical: No    Non-medical: No  Tobacco Use  . Smoking status: Never Smoker  . Smokeless tobacco: Never Used  Substance and Sexual Activity  . Alcohol use: Yes    Alcohol/week: 0.0 standard drinks    Comment: Minimal alcohol consumption WINE   . Drug use: No  . Sexual activity: Not Currently    Birth control/protection: Post-menopausal  Lifestyle  . Physical activity    Days per week: 0 days    Minutes per session: 0 min  . Stress: Not at all  Relationships  . Social connections    Talks on phone: More than three times a week    Gets together: Twice a week    Attends religious service: More than 4 times per year    Active member of club or organization: Yes    Attends meetings of clubs or organizations: More than 4 times per year    Relationship status: Divorced  Other Topics Concern  . Not on file  Social History Narrative  . Not on file    Tobacco Counseling Counseling given: Not Answered   Clinical Intake:  Pre-visit preparation completed: Yes  Pain : No/denies pain     BMI - recorded: 41.05 Nutritional Status: BMI > 30  Obese Nutritional Risks: None Diabetes: No  How often do you need to have someone help you when you read instructions, pamphlets, or other written materials from your doctor or pharmacy?: 1 - Never  Interpreter Needed?: No  Information entered by :: Clemetine Marker LPN   Activities of Daily Living In your present state of health, do you have any difficulty performing the following activities:  08/05/2018  Hearing? N  Comment declines hearing aids  Vision? N  Difficulty concentrating or making decisions? Y  Comment decision making  Walking or climbing stairs? N  Dressing or bathing? N  Doing errands, shopping? N  Preparing Food and eating ? N  Using the Toilet? N  In the past six months, have you accidently leaked urine? Y  Comment wears pads for protection  Do you have problems with loss of bowel control? N  Managing your Medications? N  Managing your Finances? N  Housekeeping or managing your Housekeeping? N  Some recent data might be hidden     Immunizations and Health Maintenance Immunization History  Administered Date(s) Administered  . H1N1 02/25/2017  . Influenza, High Dose Seasonal PF 01/29/2018  . Influenza,inj,Quad PF,6+ Mos 11/24/2015  . Influenza-Unspecified 11/13/2010  . Pneumococcal Polysaccharide-23 03/21/2017  . Tdap 07/14/2013  . Zoster 11/24/2015   Health Maintenance Due  Topic Date Due  . PNA vac Low Risk Adult (2 of 2 - PCV13) 03/21/2018    Patient Care Team: Steele Sizer, MD as PCP - General (Family Medicine) Will Bonnet, MD as Attending Physician (Obstetrics and Gynecology) Noreene Filbert, MD as Referring Physician (Radiation Oncology) Phylis Bougie, MD as Referring Physician (Cardiothoracic Surgery) Shirline Frees, MD as Referring Physician (Radiation Oncology) Clent Jacks, RN as Registered Nurse  Indicate any recent Medical Services you may have received from other than [facility] providers in the past year (date may be approximate).     Assessment:   This is a routine wellness examination for Monnie.  Hearing/Vision screen  Hearing Screening   125Hz  250Hz  500Hz  1000Hz  2000Hz  3000Hz  4000Hz  6000Hz  8000Hz   Right ear:           Left ear:           Comments: Pt denies hearing difficulty  Vision Screening Comments: Past due for eye exam   Dietary issues and exercise activities discussed: Current Exercise  Habits: The patient does not participate in regular exercise at present, Exercise limited by: None identified  Goals    . Weight (lb) < 250 lb (113.4 kg)     Pt would like to lose weight over the next year with healthy eating and increasing physical activity      Depression Screen PHQ 2/9 Scores 08/05/2018 01/29/2018 07/15/2017 06/24/2017 11/21/2016 02/24/2015 08/23/2014  PHQ - 2 Score 4 0 0 0 0 0 0  PHQ- 9 Score 7 0 1 1 - - -    Fall Risk Fall Risk  08/05/2018 01/29/2018 07/15/2017 06/24/2017 03/21/2017  Falls in the past year? 0 0 No No No  Number falls in past yr: 0 - - - -  Injury with Fall? 0 - - - -  Follow up Falls prevention discussed - - - -   FALL RISK PREVENTION PERTAINING TO THE HOME:  Any stairs in or around the home? Yes  If so, do they handrails? Yes   Home free of loose throw rugs in walkways, pet beds, electrical cords, etc? Yes  Adequate lighting in your home to reduce risk of falls? Yes   ASSISTIVE DEVICES UTILIZED TO PREVENT FALLS:  Life alert? no Use of a cane, walker or w/c? No  Grab bars in the bathroom? No  Shower chair or bench in shower? No  Elevated toilet seat or a handicapped toilet? No   DME ORDERS:  DME order needed?  No   TIMED UP AND GO:  Was the test performed? No . Telephonic visit.  Education: Fall risk prevention has been discussed.  Intervention(s) required? No   Cognitive Function:     6CIT Screen 08/05/2018  What Year? 0 points  What month? 0 points  What time? 0 points  Count back from 20 0 points  Months in reverse 0 points  Repeat phrase 0 points  Total Score 0    Screening Tests Health Maintenance  Topic Date Due  . PNA vac Low Risk Adult (2 of 2 - PCV13) 03/21/2018  . INFLUENZA VACCINE  09/13/2018  . Fecal DNA (Cologuard)  11/25/2018  . MAMMOGRAM  04/17/2019  . TETANUS/TDAP  07/15/2023  . DEXA SCAN  Completed  . Hepatitis C Screening  Completed    Qualifies for Shingles Vaccine? Yes  Zostavax completed 2017. Due  for Shingrix. Education has been provided regarding the importance of this vaccine. Pt has been advised to call insurance company to determine out of pocket expense. Advised may also receive vaccine at local pharmacy or Health Dept. Verbalized acceptance and understanding.  Tdap: Up to date  Flu Vaccine: Up to date  Pneumococcal Vaccine: Due for Pneumococcal vaccine. Does the patient want to receive this vaccine today?  No . Education has been provided regarding the importance of this vaccine but still declined. Advised may receive this vaccine at local pharmacy or Health Dept. Aware to provide a copy of the vaccination record if obtained from local pharmacy or Health Dept. Verbalized acceptance and understanding.  Cancer Screenings:  Colorectal Screening: Cologuard Completed 11/25/15. Repeat every 3 years.   Mammogram: Completed 04/16/17. Repeat every year. Ordered today. Pt provided with contact information and advised to call to schedule appt.   Bone Density: Completed 08/20/17. Results reflect  OSTEOPENIA. Repeat every 2 years.   Lung Cancer Screening: (Low Dose CT Chest recommended if Age 36-80 years, 30 pack-year currently smoking OR have quit w/in 15years.) does not qualify.   Additional Screening:  Hepatitis C Screening: does qualify; Completed 06/24/17  Vision Screening: Recommended annual ophthalmology exams for early detection of glaucoma and other disorders of the eye. Is the patient up to date with their annual eye exam?  No  Who is the provider or what is the name of the office in which the pt attends annual eye exams? Not established   Dental Screening: Recommended annual dental exams for proper oral hygiene  Community Resource Referral:  CRR required this visit?  Yes    Plan:    I have personally reviewed and addressed the Medicare Annual Wellness questionnaire and have noted the following in the patient's chart:  A. Medical and social history B. Use of alcohol,  tobacco or illicit drugs  C. Current medications and supplements D. Functional ability and status E.  Nutritional status F.  Physical activity G. Advance directives H. List of other physicians I.  Hospitalizations, surgeries, and ER visits in previous 12 months J.  [place] such as hearing and vision if needed, cognitive and depression L. Referrals and appointments   In addition, I have reviewed and discussed with patient certain preventive protocols, quality metrics, and best practice recommendations. A written personalized care plan for preventive services as well as general preventive health recommendations were provided to patient.   Signed,   Clemetine Marker, LPN Nurse Health Advisor   Nurse Notes: pt c/o depression sxs with PHQ9 of 7 today. She attributes this to being at home more and not being able to go anywhere along with reduced working hours from [employer]. Patient plans to discuss more at follow up appt with Dr. Ancil Boozer on Friday 08/08/18.

## 2018-08-08 ENCOUNTER — Telehealth: Payer: Self-pay

## 2018-08-08 ENCOUNTER — Ambulatory Visit (INDEPENDENT_AMBULATORY_CARE_PROVIDER_SITE_OTHER): Payer: Medicare HMO | Admitting: Family Medicine

## 2018-08-08 ENCOUNTER — Ambulatory Visit: Payer: Medicare HMO

## 2018-08-08 ENCOUNTER — Encounter: Payer: Self-pay | Admitting: Family Medicine

## 2018-08-08 ENCOUNTER — Other Ambulatory Visit: Payer: Self-pay

## 2018-08-08 VITALS — BP 130/76 | HR 88 | Temp 97.9°F | Resp 16 | Ht 68.25 in | Wt 273.4 lb

## 2018-08-08 DIAGNOSIS — K8020 Calculus of gallbladder without cholecystitis without obstruction: Secondary | ICD-10-CM | POA: Insufficient documentation

## 2018-08-08 DIAGNOSIS — F320 Major depressive disorder, single episode, mild: Secondary | ICD-10-CM | POA: Diagnosis not present

## 2018-08-08 DIAGNOSIS — K5730 Diverticulosis of large intestine without perforation or abscess without bleeding: Secondary | ICD-10-CM

## 2018-08-08 DIAGNOSIS — R739 Hyperglycemia, unspecified: Secondary | ICD-10-CM

## 2018-08-08 DIAGNOSIS — Z952 Presence of prosthetic heart valve: Secondary | ICD-10-CM

## 2018-08-08 DIAGNOSIS — Z23 Encounter for immunization: Secondary | ICD-10-CM

## 2018-08-08 DIAGNOSIS — D708 Other neutropenia: Secondary | ICD-10-CM

## 2018-08-08 DIAGNOSIS — I890 Lymphedema, not elsewhere classified: Secondary | ICD-10-CM | POA: Diagnosis not present

## 2018-08-08 DIAGNOSIS — E785 Hyperlipidemia, unspecified: Secondary | ICD-10-CM | POA: Diagnosis not present

## 2018-08-08 DIAGNOSIS — N393 Stress incontinence (female) (male): Secondary | ICD-10-CM

## 2018-08-08 DIAGNOSIS — R69 Illness, unspecified: Secondary | ICD-10-CM | POA: Diagnosis not present

## 2018-08-08 DIAGNOSIS — I10 Essential (primary) hypertension: Secondary | ICD-10-CM

## 2018-08-08 MED ORDER — TELMISARTAN 40 MG PO TABS: 40.0000 mg | ORAL_TABLET | Freq: Every day | ORAL | 1 refills | Status: DC

## 2018-08-08 MED ORDER — PRAVASTATIN SODIUM 20 MG PO TABS: 20.0000 mg | ORAL_TABLET | ORAL | 1 refills | Status: DC

## 2018-08-08 NOTE — Progress Notes (Signed)
Name: Melody Soto   MRN: [id number]    DOB: 04-Mar-1950   Date:08/08/2018       Progress Note  Subjective  Chief Complaint  Chief Complaint  Patient presents with  . Medication Refill  . Depression  . Hypertension  . Obesity  . Metabolic Syndrome    HPI  History of endometrial cancer:  had spotting in Feb, Dr. Baruch Gouty ordered a CT, discussed results including diverticulosis and cholelithiasis, including symptoms of acute cholecystitis and acute diverticulitis. She also had a pelvic US and everything was negative   HTN: states bp at home has been in the 130's/80's, she is off HCTZ , only on Micardis now and is not having any side effects. No chest pain or palpitation   Obesity: she has lost 12 lbs prior to last visit, but has been gaining weight gradually again, she has been less active since COVID-19 and has been eating more   Metabolic syndrome: last MBWG6K was 5.8% denies polyphagia, polyuria or polydipsia.We will recheck labs   Lymphedema: she responded well to PT , she has not been the pump lately, discussed using as exercise   S/p TAVR: she had surgery done at [facility] on 02/27/2017 for severe aortic stenosis. She is on Metoprolol bid No palpitation, chest pain or orthopnea.    Depression: she took Celexa many years ago, she has a long history of depression ( worse when was separating from her husband),she is struggling more since COVID 19, being isolated. She states she is a people's person  Urinary incontinence: she was seen by Urologist , tried medication but did not improve symptoms, so she stopped taking it, she is using a pad now   Leucopenia: we will recheck labs   Patient Active Problem List   Diagnosis Date Noted  . Other neutropenia ([place]) 07/15/2017  . Endometrial cancer ([place]) 04/10/2017  . 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 Chronic acquired lymphedema 01/08/2017  . Obesity, morbid, BMI 40.0-49.9 ([place]) 11/13/2016  . FIGO  stage II endometrial cancer ([place]) 10/29/2016  . Left ventricular hypertrophy 11/27/2014  . Lymphedema of lower extremity 08/23/2014  . Severe aortic stenosis 08/21/2014  . Benign essential HTN 08/21/2014  . Depression, major, recurrent, mild ([place]) 08/21/2014  . Dyslipidemia 08/21/2014  . Cardiac dysrhythmia 08/21/2014  . Dysmetabolic syndrome 59/93/5701  . Symptomatic menopausal or female climacteric states 08/21/2014  . 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 History of shingles 08/21/2014  . Personal history of skin cancer 11/24/2009    Past Surgical History:  Procedure Laterality Date  . 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 cataract surgery Bilateral 05/24/2014   second eye 06/07/2014  . MELANOMA EXCISION    . OVARIAN CYST REMOVAL     EXPLORATORY LAPAROTOMY  . TONSILLECTOMY      Family History  Problem Relation Age of Onset  . Cancer Mother   . Stroke Mother   . Thyroid nodules Mother   . Diabetes Sister   . Hypertension Sister   . Breast cancer Maternal Aunt   . Bone cancer Maternal Uncle   . Depression Daughter   . Depression Daughter     Social History   Socioeconomic History  . Marital status: Divorced    Spouse name: Not on file  . Number of children: 2  . Years of education: Not on file  . Highest education level: Bachelor's degree (e.g., BA, AB, BS)  Occupational History  . Not on file  Social Needs  .  Financial resource strain: Somewhat hard  . Food insecurity    Worry: Never true    Inability: Never true  . Transportation needs    Medical: No    Non-medical: No  Tobacco Use  . Smoking status: Never Smoker  . Smokeless tobacco: Never Used  Substance and Sexual Activity  . Alcohol use: Yes    Alcohol/week: 0.0 standard drinks    Comment: Minimal alcohol consumption WINE   . Drug use: No  . Sexual activity: Not Currently    Birth control/protection: Post-menopausal  Lifestyle  . Physical activity    Days per week: 0  days    Minutes per session: 0 min  . Stress: Not at all  Relationships  . Social connections    Talks on phone: More than three times a week    Gets together: Twice a week    Attends religious service: More than 4 times per year    Active member of club or organization: Yes    Attends meetings of clubs or organizations: More than 4 times per year    Relationship status: Divorced  . Intimate partner violence    Fear of current or ex partner: No    Emotionally abused: No    Physically abused: No    Forced sexual activity: No  Other Topics Concern  . Not on file  Social History Narrative  . Not on file     Current Outpatient Medications:  .  aspirin 81 MG chewable tablet, Chew 81 mg by mouth. Taking 3 times per week, Disp: , Rfl:  .  b complex vitamins tablet, Take 1 tablet by mouth daily., Disp: , Rfl:  .  Calcium Carb-Cholecalciferol (CALCIUM 500 + D3) 500-200 MG-UNIT TABS, Take 1 capsule by mouth 2 (two) times daily., Disp: , Rfl:  .  Coenzyme Q10 (COQ10) 50 MG CAPS, Take 1 tablet by mouth. , Disp: , Rfl:  .  magnesium oxide (MAG-OX) 400 MG tablet, Take 1 tablet by mouth 2 (two) times daily., Disp: , Rfl:  .  metoprolol tartrate (LOPRESSOR) 25 MG tablet, Take 1 tablet (25 mg total) by mouth 2 (two) times daily., Disp: 180 tablet, Rfl: 1 .  MULTIPLE VITAMIN PO, Take 2 tablets by mouth 2 (two) times daily. Reported on 05/19/2015, Disp: , Rfl:  .  pravastatin (PRAVACHOL) 20 MG tablet, Take 1 tablet (20 mg total) by mouth daily. (Patient taking differently: Take 20 mg by mouth daily. Taking 3 times per week), Disp: 90 tablet, Rfl: 0 .  telmisartan (MICARDIS) 40 MG tablet, TAKE 1 TABLET BY MOUTH EVERY DAY, Disp: 90 tablet, Rfl: 0 .  vitamin B-12 (CYANOCOBALAMIN) 1000 MCG tablet, Take 1,000 mcg by mouth daily. , Disp: , Rfl:   Allergies  Allergen Reactions  . Ace Inhibitors Other (See Comments)    UNKNOWN REACTION  . Duloxetine     drowsiness    I personally reviewed active  problem list, medication list, allergies, family history with the patient/caregiver today.   ROS  Constitutional: Negative for fever , positive for  weight change.  Respiratory: Negative for cough and shortness of breath.   Cardiovascular: Negative for chest pain or palpitations.  Gastrointestinal: Negative for abdominal pain, no bowel changes.  Musculoskeletal: Negative for gait problem or joint swelling.  Skin: Negative for rash.  Neurological: Negative for dizziness or headache.  No other specific complaints in a complete review of systems (except as listed in HPI above).  Objective  Vitals:   08/08/18  0910  BP: 130/76  Pulse: 88  Resp: 16  Temp: 97.9 F (36.6 C)  TempSrc: Oral  SpO2: 97%  Weight: 273 lb 6.4 oz (124 kg)  Height: 5' 8.25" (1.734 m)    Body mass index is 41.27 kg/m.  Physical Exam  Constitutional: Patient appears well-developed and well-nourished. Obese No distress.  HEENT: head atraumatic, normocephalic, pupils equal and reactive to light,  neck supple, throat within normal limits Cardiovascular: Normal rate, regular rhythm and normal heart sounds.  No murmur heard. Positive for BLE edema. Non pitting  Pulmonary/Chest: Effort normal and breath sounds normal. No respiratory distress. Abdominal: Soft.  There is no tenderness. Psychiatric: Patient has a normal mood and affect. behavior is normal. Judgment and thought content normal.  PHQ2/9: Depression screen [facility] 2/9 08/08/2018 08/05/2018 01/29/2018 07/15/2017 06/24/2017  Decreased Interest 1 2 0 0 0  Down, Depressed, Hopeless 0 2 0 0 0  PHQ - 2 Score 1 4 0 0 0  Altered sleeping 0 0 0 0 0  Tired, decreased energy 1 1 0 0 0  Change in appetite 2 2 0 0 0  Feeling bad or failure about yourself  0 0 0 0 0  Trouble concentrating 1 0 0 1 1  Moving slowly or fidgety/restless 0 0 0 0 0  Suicidal thoughts 0 0 0 0 0  PHQ-9 Score 5 7 0 1 1  Difficult doing work/chores Not difficult at all Somewhat difficult Not  difficult at all Not difficult at all Not difficult at all    phq 9 is positive   Fall Risk: Fall Risk  08/08/2018 08/05/2018 01/29/2018 07/15/2017 06/24/2017  Falls in the past year? 0 0 0 No No  Number falls in past yr: 0 0 - - -  Injury with Fall? 0 0 - - -  Follow up - Falls prevention discussed - - -     Functional Status Survey: Is the patient deaf or have difficulty hearing?: No Does the patient have difficulty seeing, even when wearing glasses/contacts?: Yes(glasses) Does the patient have difficulty concentrating, remembering, or making decisions?: Yes(decision making) Does the patient have difficulty walking or climbing stairs?: No Does the patient have difficulty dressing or bathing?: No Does the patient have difficulty doing errands alone such as visiting a doctor's office or shopping?: No   Assessment & Plan  1. Mild major depression ([place])  She does not want to resume medication   2. Need for Streptococcus pneumoniae vaccination  - Pneumococcal conjugate vaccine 13-valent  3. Other neutropenia ([place])  Recheck labs  4. Obesity, morbid, BMI 40.0-49.9 ([place])  Discussed with the patient the risk posed by an increased BMI. Discussed importance of portion control, calorie counting and at least 150 minutes of physical activity weekly. Avoid sweet beverages and drink more water. Eat at least 6 servings of fruit and vegetables daily   5. Diverticulosis of colon  Discussed with patient   6. Calculus of gallbladder without cholecystitis without obstruction  Discussed symptoms of acute cholecystitis   7. Benign essential HTN  - COMPLETE METABOLIC PANEL WITH GFR - CBC with Differential/Platelet - telmisartan (MICARDIS) 40 MG tablet; Take 1 tablet (40 mg total) by mouth daily.  Dispense: 90 tablet; Refill: 1  8. Dyslipidemia  - Lipid panel - pravastatin (PRAVACHOL) 20 MG tablet; Take 1 tablet (20 mg total) by mouth 3 (three) times a week.  Dispense: 36 tablet; Refill:  1  9. Chronic acquired lymphedema  Advised to resume using pump  10. Hyperglycemia  - Hemoglobin A1c  11. S/P TAVR (transcatheter aortic valve replacement)   12. Female genuine stress incontinence

## 2018-08-08 NOTE — Telephone Encounter (Signed)
Copied from [place] [id number]. Topic: Referral - Status >> Aug 08, 2018  9:21 PM Simone Curia D wrote: [id number] Spoke with patient about resources for food banks, and financial assistance with utilities. Emailed resources to [email or web address]. MA

## 2018-08-09 LAB — CBC WITH DIFFERENTIAL/PLATELET
Absolute Monocytes: 663 cells/uL (ref 200–950)
Basophils Absolute: 67 cells/uL (ref 0–200)
Basophils Relative: 1 %
Eosinophils Absolute: 141 cells/uL (ref 15–500)
Eosinophils Relative: 2.1 %
HCT: 43.6 % (ref 35.0–45.0)
Hemoglobin: 14.6 g/dL (ref 11.7–15.5)
Lymphs Abs: 1320 cells/uL (ref 850–3900)
MCH: 34 pg — ABNORMAL HIGH (ref 27.0–33.0)
MCHC: 33.5 g/dL (ref 32.0–36.0)
MCV: 101.6 fL — ABNORMAL HIGH (ref 80.0–100.0)
MPV: 10.9 fL (ref 7.5–12.5)
Monocytes Relative: 9.9 %
Neutro Abs: 4509 cells/uL (ref 1500–7800)
Neutrophils Relative %: 67.3 %
Platelets: 270 10*3/uL (ref 140–400)
RBC: 4.29 10*6/uL (ref 3.80–5.10)
RDW: 12.7 % (ref 11.0–15.0)
Total Lymphocyte: 19.7 %
WBC: 6.7 10*3/uL (ref 3.8–10.8)

## 2018-08-09 LAB — COMPLETE METABOLIC PANEL WITH GFR
AG Ratio: 1.6 (calc) (ref 1.0–2.5)
ALT: 27 U/L (ref 6–29)
AST: 23 U/L (ref 10–35)
Albumin: 4 g/dL (ref 3.6–5.1)
Alkaline phosphatase (APISO): 73 U/L (ref 37–153)
BUN: 17 mg/dL (ref 7–25)
CO2: 24 mmol/L (ref 20–32)
Calcium: 9.8 mg/dL (ref 8.6–10.4)
Chloride: 105 mmol/L (ref 98–110)
Creat: 0.73 mg/dL (ref 0.50–0.99)
GFR, Est African American: 99 mL/min/{1.73_m2} (ref >=60)
GFR, Est Non African American: 86 mL/min/{1.73_m2} (ref >=60)
Globulin: 2.5 g/dL (calc) (ref 1.9–3.7)
Glucose, Bld: 113 mg/dL — ABNORMAL HIGH (ref 65–99)
Potassium: 4.8 mmol/L (ref 3.5–5.3)
Sodium: 139 mmol/L (ref 135–146)
Total Bilirubin: 0.5 mg/dL (ref 0.2–1.2)
Total Protein: 6.5 g/dL (ref 6.1–8.1)

## 2018-08-09 LAB — LIPID PANEL
Cholesterol: 205 mg/dL — ABNORMAL HIGH (ref <200)
HDL: 48 mg/dL — ABNORMAL LOW (ref >=50)
LDL Cholesterol (Calc): 125 mg/dL (calc) — ABNORMAL HIGH
Non-HDL Cholesterol (Calc): 157 mg/dL (calc) — ABNORMAL HIGH (ref <130)
Total CHOL/HDL Ratio: 4.3 (calc) (ref <5.0)
Triglycerides: 200 mg/dL — ABNORMAL HIGH (ref <150)

## 2018-08-09 LAB — HEMOGLOBIN A1C
Hgb A1c MFr Bld: 5.7 % of total Hgb — ABNORMAL HIGH (ref <5.7)
Mean Plasma Glucose: 117 (calc)
eAG (mmol/L): 6.5 (calc)

## 2018-08-10 ENCOUNTER — Encounter: Payer: Self-pay | Admitting: Family Medicine

## 2018-08-11 ENCOUNTER — Other Ambulatory Visit: Payer: Self-pay | Admitting: Family Medicine

## 2018-08-11 MED ORDER — ATORVASTATIN CALCIUM 40 MG PO TABS: 40.0000 mg | ORAL_TABLET | ORAL | 0 refills | Status: DC

## 2018-08-13 ENCOUNTER — Other Ambulatory Visit: Payer: Self-pay

## 2018-08-13 ENCOUNTER — Telehealth: Payer: Self-pay

## 2018-08-13 ENCOUNTER — Inpatient Hospital Stay: Payer: Medicare HMO | Attending: Obstetrics and Gynecology | Admitting: Obstetrics and Gynecology

## 2018-08-13 VITALS — BP 157/82 | HR 69 | Temp 97.8°F | Resp 18 | Ht 68.25 in | Wt 277.3 lb

## 2018-08-13 DIAGNOSIS — E785 Hyperlipidemia, unspecified: Secondary | ICD-10-CM | POA: Insufficient documentation

## 2018-08-13 DIAGNOSIS — Z79899 Other long term (current) drug therapy: Secondary | ICD-10-CM | POA: Insufficient documentation

## 2018-08-13 DIAGNOSIS — Z923 Personal history of irradiation: Secondary | ICD-10-CM | POA: Diagnosis not present

## 2018-08-13 DIAGNOSIS — C541 Malignant neoplasm of endometrium: Secondary | ICD-10-CM | POA: Diagnosis not present

## 2018-08-13 DIAGNOSIS — I350 Nonrheumatic aortic (valve) stenosis: Secondary | ICD-10-CM | POA: Diagnosis not present

## 2018-08-13 DIAGNOSIS — R69 Illness, unspecified: Secondary | ICD-10-CM | POA: Diagnosis not present

## 2018-08-13 DIAGNOSIS — I10 Essential (primary) hypertension: Secondary | ICD-10-CM | POA: Diagnosis not present

## 2018-08-13 DIAGNOSIS — Z7982 Long term (current) use of aspirin: Secondary | ICD-10-CM | POA: Insufficient documentation

## 2018-08-13 DIAGNOSIS — R918 Other nonspecific abnormal finding of lung field: Secondary | ICD-10-CM | POA: Diagnosis not present

## 2018-08-13 DIAGNOSIS — Z6841 Body Mass Index (BMI) 40.0 and over, adult: Secondary | ICD-10-CM | POA: Insufficient documentation

## 2018-08-13 DIAGNOSIS — F329 Major depressive disorder, single episode, unspecified: Secondary | ICD-10-CM | POA: Insufficient documentation

## 2018-08-13 NOTE — Progress Notes (Signed)
Gynecologic Oncology Interval Visit   Referring Provider: Will Bonnet,  MD  Chief Concern: Grade 1 endometrial cancer  Subjective:  Melody Soto is a 68 y.o. female G2P2, initially seen in consultation for Dr. 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 She is s/p prior XL ovarian cystectomy for benign disease. She returns to clinic today for review of imaging.   Seen in clinic by Dr. Theora Gianotti on 04/23/2018 for EMBx with results below.   DIAGNOSIS:  A. ENDOMETRIUM; BIOPSY:  - EXTREMELY SCANT BENIGN SQUAMOUS AND ENDOCERVICAL EPITHELIUM.  - NO ENDOMETRIAL TISSUE IS PRESENT.  We recommended repeat ultrasound and if EMS stable or increase we would repeat EMBx since the biopsy was not adequate for evaluation of the endometrial cavity.     06/17/2018 Pelvic Ultrasound FINDINGS: Uterus: Measurements: 6.3 x 3.8 x 5.0 cm = volume: 62.2 mL. 1.2 x 0.9 x 1.3 cm exophytic fibroid present at the level of the lower uterine segment.  Endometrium Thickness: 7.3 mm. No focal abnormality visualized. No increased vascularity.  Bilateral ovaries: Not visualized.  No adnexal mass.   Trace free fluid within the pelvis.  IMPRESSION: 1. Endometrial complex measures 7.3 mm in thickness without focalabnormality. 2. 1.3 cm fibroid arising from the lower uterine segment, stable.   She has not had any further bleeding. She is taking baby aspirin 3 x a week rather than daily.   Gynecologic Oncology History Melody Soto is a pleasant female G2P2 s/p prior XL ovarian cystectomy for benign disease who is seen in consultation from Dr. Glennon Mac for grade 1 endometrial cancer.  10/17/2016 Endometrial biopsy and biopsy of cervical polyp  Part A: ENDOMETRIUM, BIOPSY:  WELL DIFFERENTIATED ENDOMETRIAL ADENOCARCINOMA (FIGO I). COMMENT: THIS CASE WAS REVIEWED IN INTRADEPARTMENTAL CONSULTATION AND THE DIAGNOSIS REFLECTS OUR CONSENSUS OPINION.DR,[facility]  WAS INFORMED OF DIAGNOSIS ON 10/19/16 AT 1.10 PM.  Part B: ENDOCERVIX, POLYP:  MINUTE FRAGMENTS OFOF ADENOCARCINOMA PROBABLY FROM ENDOMETRIUM.  MINUTE FRAGMENTS OF BENIGN ENDOCERVICAL GLANDS, MUCUS, AND BLOOD.  Pap unsatisfactory  10/29/2017 Pelvic ultrasound Uterus 7.3 x 5 x 5 cm with small leiomyomas Bilateral ovaries normal. Endometrial stripe 7.1 mm  CT C/A/P 11/20/2016 IMPRESSION: 1. There is a mildly enlarged right hilar lymph node at 1.1 cm, neck given the that there are no other indicators of metastatic disease, this is likely incidental. No worrisome pulmonary nodules identified. 2. The endometrium is indistinct. Uterine and adnexal contours relatively normal. 3. Asymmetric glandular tissues in the right upper breast compared to the left. Asymmetry is often normal, but mammographic correlation is suggested. 4. Other imaging findings of potential clinical significance: Aortic Atherosclerosis (ICD10-I70.0). Calcified aortic valve with faint calcification of the mitral valve. Cholelithiasis. Small type 1 hiatal hernia. Descending colon diverticulosis.  She was not a candidate for surgical management due to cardiac disease. She underwent primary radiation.   She underwent WPRT (45 Gy over 5 weeks) with Dr. Baruch Gouty at [facility] followed by vaginal cuff brachytherapy with Dr. Christel Mormon at [facility]. She completed high-dose rate Brachytherapy to the cervix (treatment 5 of planned 5) on 04/02/2017.  04/16/3017 Mammogram negative   05/09/3017 CT chest wo contrast IMPRESSION: 1. Stable small scattered mediastinal and hilar lymph nodes. No mass or adenopathy. No new or progressive findings. 2. Stable small pulmonary nodules. Recommend follow-up noncontrast chest CT in 1 year. 3. Stable mild emphysematous changes.  07/31/3017 CT Abdomen Pelvis W Contrast IMPRESSION: Negative. No evidence of recurrent or metastatic carcinoma within the abdomen or pelvis.  Cholelithiasis.  No radiographic evidence of cholecystitis.  Mild hepatic steatosis. Colonic diverticulosis, without radiographic evidence of diverticulitis.  03/2018 she had noticed some vaginal spotting and evaluation included CT scan, [place], and EMBX.   04/11/2018 CT C/A/P IMPRESSION: 1. Stable exam. No new or progressive finding since prior chest CT and abdomen/pelvis CT. 2. Multiple bilateral pulmonary nodules measuring 5 mm less, stable in the 1 year interval since 05/09/2017 suggesting benign etiology. Continued attention on follow-up recommended 3. Cholelithiasis. 4. Colonic diverticulosis without diverticulitis.  04/21/2018 Pelvic US Uterus: Measurements: 5.9 x 4.3 x 5.3 cm = volume: 69.5 mL. 11 x 10 x 18 mm subserosal fibroid in the anterior lower uterine segment. Endometrium Thickness: 8 mm.  No focal abnormality visualized. Bilateral ovaries: No adnexal mass is seen.     Problem List: Patient Active Problem List   Diagnosis Date Noted  . Diverticulosis of colon 08/08/2018  . Cholelithiasis 08/08/2018  . Other neutropenia ([place]) 07/15/2017  . Endometrial cancer ([place]) 04/10/2017  . 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 Chronic acquired lymphedema 01/08/2017  . Obesity, morbid, BMI 40.0-49.9 ([place]) 11/13/2016  . FIGO stage II endometrial cancer ([place]) 10/29/2016  . Left ventricular hypertrophy 11/27/2014  . Lymphedema of lower extremity 08/23/2014  . Severe aortic stenosis 08/21/2014  . Benign essential HTN 08/21/2014  . Depression, major, recurrent, mild ([place]) 08/21/2014  . Dyslipidemia 08/21/2014  . Cardiac dysrhythmia 08/21/2014  . Dysmetabolic syndrome 52/77/8242  . Symptomatic menopausal or female climacteric states 08/21/2014  . 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 History of shingles 08/21/2014  . Personal history of skin cancer 11/24/2009    Past Medical History: Past Medical History:  Diagnosis Date  . Aortic stenosis   . Cataract   . Depression   . Endometrial cancer determined  by uterine biopsy ([place]) 10/29/2016   Rad tx's and internal brachytherapy.  Marland Kitchen Heart murmur    HX OF  . Hyperlipidemia   . Hypertension    CONTROLLED ON MEDS  . Lymphedema   . Shortness of breath dyspnea     Past Surgical History: Past Surgical History:  Procedure Laterality Date  . 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 cataract surgery Bilateral 05/24/2014   second eye 06/07/2014  . MELANOMA EXCISION    . OVARIAN CYST REMOVAL     EXPLORATORY LAPAROTOMY  . TONSILLECTOMY      Past Gynecologic History:  Last Menstrual Period:  2016; postmenopausal Last pap: 2018   OB History:  OB History  Gravida Para Term Preterm AB Living  2 2       2   SAB TAB Ectopic Multiple Live Births               # Outcome Date GA Lbr Len/2nd Weight Sex Delivery Anes PTL Lv  2 Para           1 Para             Obstetric Comments  7 pregancies; 2 NSVDs    Family History: Family History  Problem Relation Age of Onset  . Cancer Mother   . Stroke Mother   . Thyroid nodules Mother   . Diabetes Sister   . Hypertension Sister   . Breast cancer Maternal Aunt   . Bone cancer Maternal Uncle   . Depression Daughter   . Depression Daughter     Social History: Social History   Socioeconomic History  . Marital status: Divorced    Spouse name: Not  on file  . Number of children: 2  . Years of education: Not on file  . Highest education level: Bachelor's degree (e.g., BA, AB, BS)  Occupational History  . Not on file  Social Needs  . Financial resource strain: Somewhat hard  . Food insecurity    Worry: Never true    Inability: Never true  . Transportation needs    Medical: No    Non-medical: No  Tobacco Use  . Smoking status: Never Smoker  . Smokeless tobacco: Never Used  Substance and Sexual Activity  . Alcohol use: Yes    Alcohol/week: 0.0 standard drinks    Comment: Minimal alcohol consumption WINE   . Drug use: No  . Sexual activity: Not Currently    Birth  control/protection: Post-menopausal  Lifestyle  . Physical activity    Days per week: 0 days    Minutes per session: 0 min  . Stress: Not at all  Relationships  . Social connections    Talks on phone: More than three times a week    Gets together: Twice a week    Attends religious service: More than 4 times per year    Active member of club or organization: Yes    Attends meetings of clubs or organizations: More than 4 times per year    Relationship status: Divorced  . Intimate partner violence    Fear of current or ex partner: No    Emotionally abused: No    Physically abused: No    Forced sexual activity: No  Other Topics Concern  . Not on file  Social History Narrative  . Not on file    Allergies: Allergies  Allergen Reactions  . Ace Inhibitors Other (See Comments)    UNKNOWN REACTION  . Duloxetine     drowsiness    Current Medications: Current Outpatient Medications  Medication Sig Dispense Refill  . aspirin 81 MG chewable tablet Chew 81 mg by mouth. Taking 3 times per week    . atorvastatin (LIPITOR) 40 MG tablet Take 1 tablet (40 mg total) by mouth 3 (three) times a week. 36 tablet 0  . b complex vitamins tablet Take 1 tablet by mouth daily.    . Calcium Carb-Cholecalciferol (CALCIUM 500 + D3) 500-200 MG-UNIT TABS Take 1 capsule by mouth 2 (two) times daily.    . Coenzyme Q10 (COQ10) 50 MG CAPS Take 1 tablet by mouth.     . magnesium oxide (MAG-OX) 400 MG tablet Take 1 tablet by mouth 2 (two) times daily.    . metoprolol tartrate (LOPRESSOR) 25 MG tablet Take 1 tablet (25 mg total) by mouth 2 (two) times daily. 180 tablet 1  . MULTIPLE VITAMIN PO Take 2 tablets by mouth 2 (two) times daily. Reported on 05/19/2015    . telmisartan (MICARDIS) 40 MG tablet Take 1 tablet (40 mg total) by mouth daily. 90 tablet 1  . vitamin B-12 (CYANOCOBALAMIN) 1000 MCG tablet Take 1,000 mcg by mouth daily.      No current facility-administered medications for this visit.    Review  of Systems General:  no complaints Skin: no complaints Eyes: no complaints HEENT: no complaints Breasts: no complaints Pulmonary: no complaints Cardiac: no complaints Extremities: chronic BLE since her 81s Gastrointestinal: no complaints Genitourinary/Sexual: h/o bladder issues since brachytherapy but no complaints today Gyn: no complaints Musculoskeletal: no complaints Hematology: no complaints Neurologic/Psych: depression   Objective:  Physical Examination:  BP (!) 157/82 (Patient Position: Sitting) Comment (BP Location): right  forarm  Pulse 69   Temp 97.8 F (36.6 C) (Tympanic)   Resp 18   Ht 5' 8.25" (1.734 m)   Wt 277 lb 4.8 oz (125.8 kg)   SpO2 97%   BMI 41.85 kg/m    ECOG Performance Status: 1 - Symptomatic but completely ambulatory   General appearance: alert, cooperative and appears stated age HEENT: atraumatic and normocephalic Lymph node survey: non-palpable, axillary, inguinal, supraclavicular Cardiovascular: regular rate and rhythm Respiratory: bilateral clear to auscultation. Abdomen: Soft nondistended and nontender. Extremities:No edema Skin exam - Well healed incisions.  Neurological exam reveals alert, oriented, normal speech, no focal findings or movement disorder noted  Pelvic: Vulva: normal appearing vulva with no masses, tenderness or lesions; Vagina: normal; Adnexa: negative for masses or nodularity; Uterus and Cervix: surgically absent; Rectal: confirms  Pelvic: Exam Chaperoned by Nurse:  Vulva: normal appearing vulva without masses, tenderness, or lesions. Vagina: normal vagina. Cervix: normal appearing. Adnexa: unable to palpate. Uterus: not grossly enlarged. Nontender. Exam limited d/t body habitus. Rectal: deferred      Assessment:  Melody Soto is a 68 y.o. female diagnosed with possible stage II grade 1 endometrial cancer with involvement of a cervical polyp s/p primary radiation therapy. NED on exam, however, spotting worrisome for  disease recurrence.  Imaging unremarkable other than thickened EMS. EMBx obtained with scant tissue, repeat imaging decreased endometrial stripe 06/2018 and no further bleeding episodes.  Stable small pulmonary nodules on imaging - repeat CT chest 03/2018 stable.   Enlarged hilar node - no evidence of progression on imaging.   Asymmetric breast tissue - negative mammogram, recommended she follow up with her screening mammogram.   S/P TAVR (transcatheter aortic valve replacement)   Medical co-morbidities complicating care: left ventricular hypertrophy, severe aortic stenosis, cardiac dysrhythmia, hyperlipidemia,, HTN and obesity.    Plan:   Problem List Items Addressed This Visit      Genitourinary   Endometrial cancer [facility]) - Primary      Ms. Staat has possible stage II endometrial cancer, grade 1 endometrioid, with cervical involvement. She has several significant comorbidities and was treated with primary radiation therapy. NED on exam but spotting concerning for recurrence, EMBX inadequate for assessment, repeat imaging decreased but still thickened EMS. However overall reassuring. Will continue to follow closely with return to clinic in 3 months for further evaluation. Plan to repeat pelvic ultrasound in 6 months. She will contact us if she has any concerning symptoms. She will also alternate visit with Dr. Baruch Gouty.   I personally saw and evaluated the patient, provided cousneling and determined the management and treatment plan as documented.  A total of 25 minutes were spent with the patient/family today; >50% was spent in education, counseling and coordination of care for endometrial cancer.  Melody Gaetana Michaelis, MD    CC:  Will Bonnet,  MD

## 2018-08-13 NOTE — Telephone Encounter (Signed)
Copied from [place] [id number]. Topic: Referral - Status >> Aug 13, 2018  8:28 PM Melody Soto D wrote: 09/15/3742 Spoke with patient she did receive community resources for food and utilities emailed to her on 08/08/2018.  She plans to call them next week. Melody Soto has my name and number Ambrose Mantle [facility] [phone].

## 2018-09-05 ENCOUNTER — Telehealth: Payer: Self-pay

## 2018-09-05 NOTE — Telephone Encounter (Signed)
Copied from [place] [id number]. Topic: Referral - Status >> Sep 05, 2018 68:03 AM Simone Curia D wrote: 03/26/2480 Left message on voicemail to follow-up with patient about resources for utilities and food banks.  Will call again next week. Ambrose Mantle [phone]

## 2018-09-11 DIAGNOSIS — I890 Lymphedema, not elsewhere classified: Secondary | ICD-10-CM | POA: Diagnosis not present

## 2018-09-11 DIAGNOSIS — I119 Hypertensive heart disease without heart failure: Secondary | ICD-10-CM | POA: Diagnosis not present

## 2018-09-11 DIAGNOSIS — Z952 Presence of prosthetic heart valve: Secondary | ICD-10-CM | POA: Diagnosis not present

## 2018-09-11 DIAGNOSIS — Z8542 Personal history of malignant neoplasm of other parts of uterus: Secondary | ICD-10-CM | POA: Diagnosis not present

## 2018-09-11 DIAGNOSIS — I351 Nonrheumatic aortic (valve) insufficiency: Secondary | ICD-10-CM | POA: Diagnosis not present

## 2018-09-11 DIAGNOSIS — Z48812 Encounter for surgical aftercare following surgery on the circulatory system: Secondary | ICD-10-CM | POA: Diagnosis not present

## 2018-09-11 DIAGNOSIS — Z79899 Other long term (current) drug therapy: Secondary | ICD-10-CM | POA: Diagnosis not present

## 2018-09-11 DIAGNOSIS — E782 Mixed hyperlipidemia: Secondary | ICD-10-CM | POA: Diagnosis not present

## 2018-09-22 ENCOUNTER — Telehealth: Payer: Self-pay

## 2018-09-22 NOTE — Telephone Encounter (Signed)
Copied from [place] [id number]. Topic: Referral - Status >> Sep 22, 2018  1:36 PM Simone Curia D wrote: 4/38/3779 Spoke with patient, she no longer needs any assistance for food or utilities. She has been able to get more hours at work and is doing fine. Closing referral. Ambrose Mantle [phone]

## 2018-10-01 ENCOUNTER — Other Ambulatory Visit: Payer: Self-pay | Admitting: Family Medicine

## 2018-10-01 DIAGNOSIS — I10 Essential (primary) hypertension: Secondary | ICD-10-CM

## 2018-10-01 IMAGING — CT CT CHEST W/ CM
1 of 4 series · 9 of 30 positions shown, 12 images · IV contrast (iopamidol)
Comparison: Pelvic ultrasound 10/29/2016

CLINICAL DATA: Endometrial cancer staging workup, diagnosis in
October 2016.

EXAM:
CT CHEST, ABDOMEN, AND PELVIS WITH CONTRAST
TECHNIQUE: Multidetector CT imaging of the chest, abdomen and pelvis was
performed following the standard protocol during bolus
administration of intravenous contrast.
CONTRAST:  100mL 4VUR2T-GQQ IOPAMIDOL (4VUR2T-GQQ) INJECTION 61%

[Series 2: cap with · axial · 0.94mm/px · z∈[-997,-437]mm · 9 of 142 slices shown, 12 images]
[im 15/142  mediastinal]
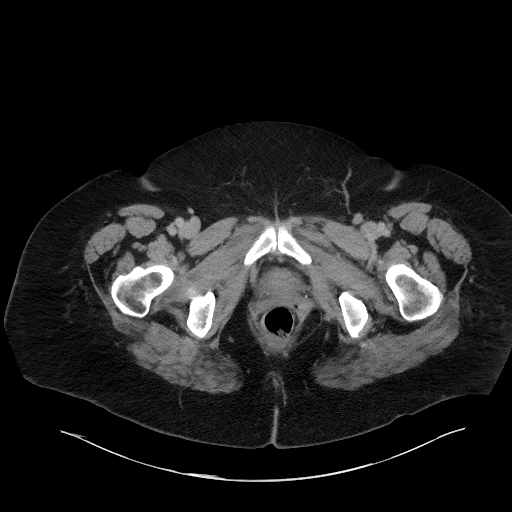
[im 15/142  lung]
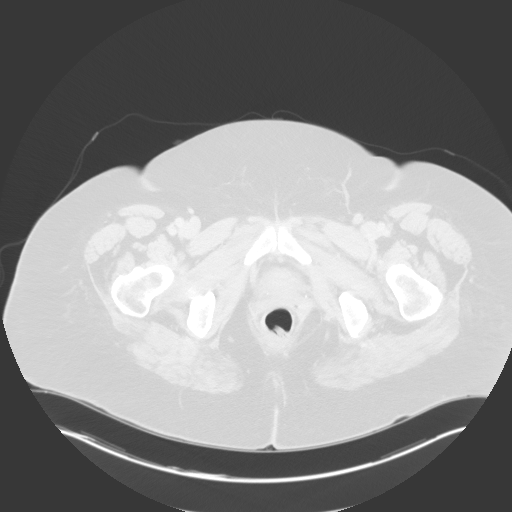
[im 29/142  lung]
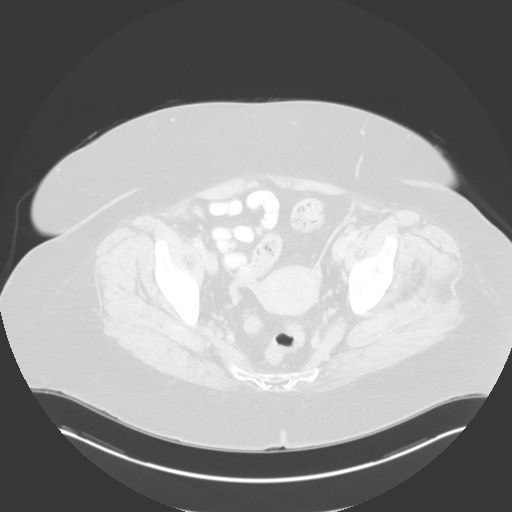
[im 43/142  lung]
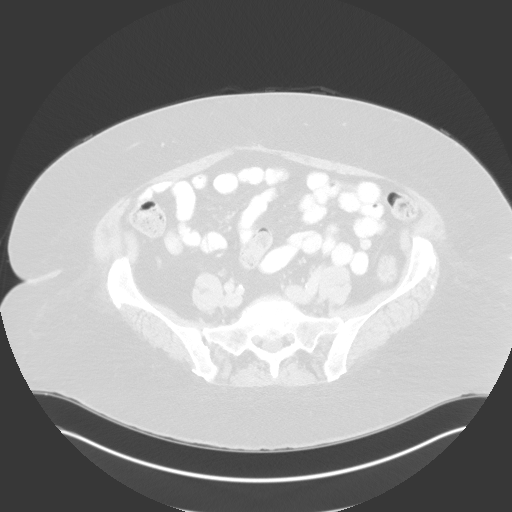
[im 57/142  lung]
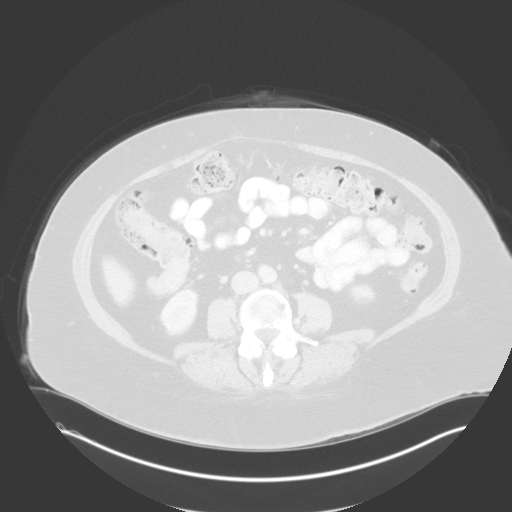
[im 71/142  mediastinal]
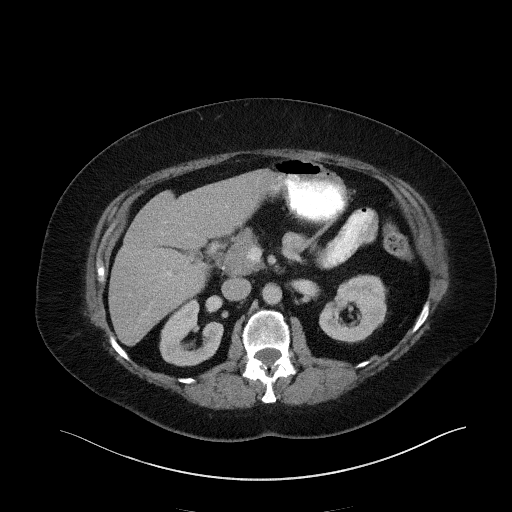
[im 71/142  lung]
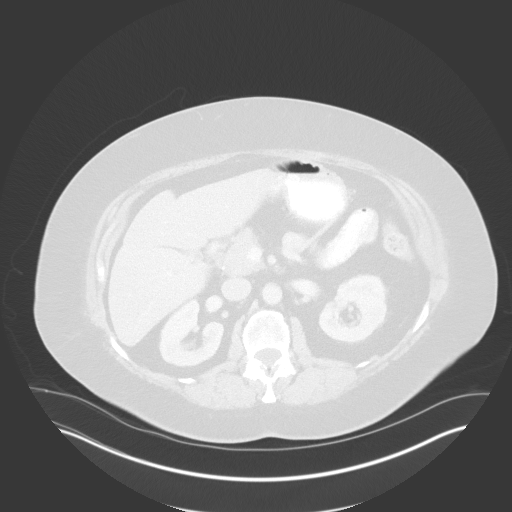
[im 85/142  lung]
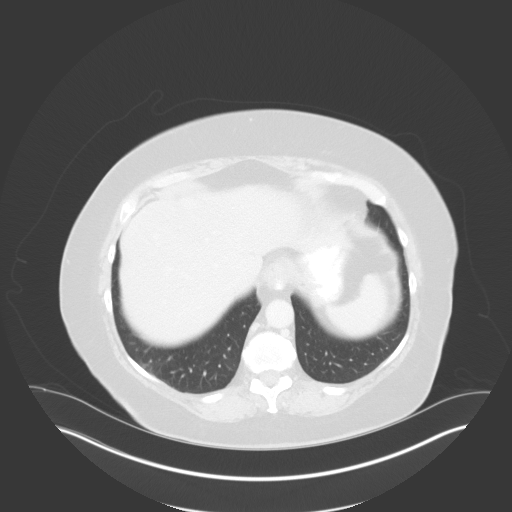
[im 99/142  lung]
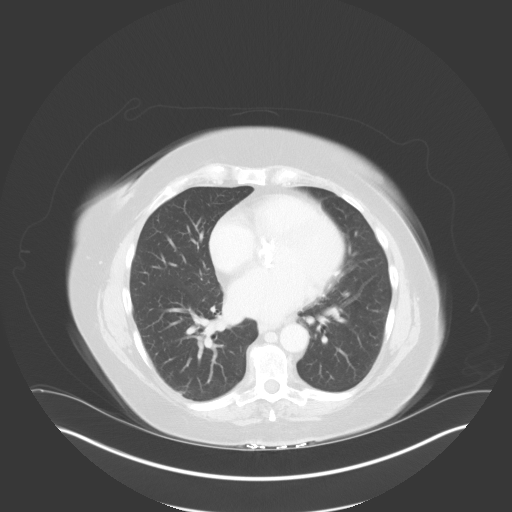
[im 113/142  lung]
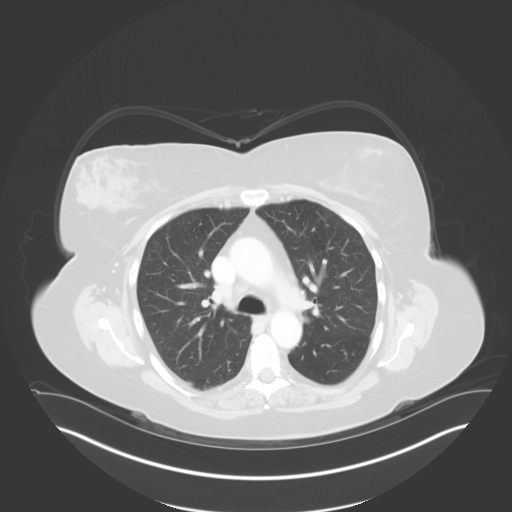
[im 127/142  mediastinal]
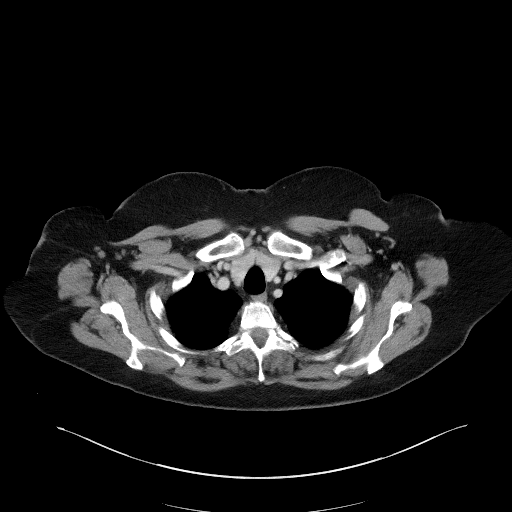
[im 127/142  lung]
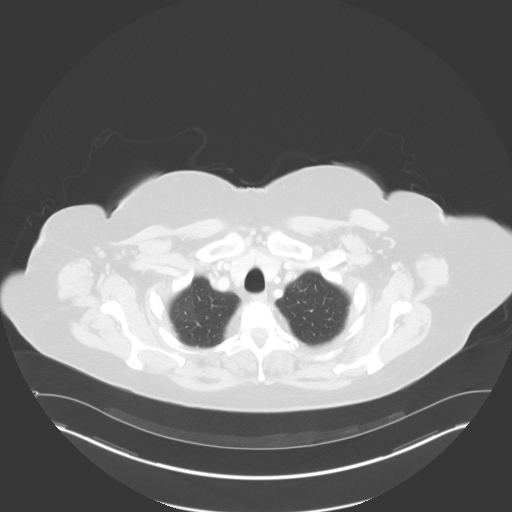

[9 of 30 positions shown; findings below may reference images not displayed]

FINDINGS: CT CHEST FINDINGS

Cardiovascular: Mild atherosclerotic calcification of the aortic
arch and proximal left subclavian artery. Calcified aortic valve and
faint calcifications of the mitral valve.

Mediastinum/Nodes: Mildly enlarged right hilar lymph node at 1.1 cm.

Lungs/Pleura: Unremarkable

Musculoskeletal: Mild thoracic spondylosis.

Note is made of asymmetric glandular tissues in the right upper
breast compared to the left. Mammographic correlation suggested.

CT ABDOMEN PELVIS FINDINGS

Hepatobiliary: 3 gallstones are present in the gallbladder, each
measuring about 1.9 cm in diameter. No gallbladder wall thickening
or pericholecystic fluid.

At the junction of the right and left hepatic lobes, a 2.0 by 2.1 cm
fluid density lesion is present and sharply defined.

The liver appears otherwise unremarkable.

Pancreas: Unremarkable

Spleen: Unremarkable

Adrenals/Urinary Tract: Unremarkable

Stomach/Bowel: Small type 1 hiatal hernia. Diverticulosis of the
ascending colon. Elongated filling defect in the colon near the
hepatic flexure likely represents stool material.

Vascular/Lymphatic: Aortoiliac atherosclerotic vascular disease.
Left common iliac node 0.8 cm in short axis on image 91/2. No
overtly pathologic adenopathy in the abdomen or pelvis.

Reproductive: No contour abnormality of the uterus or adnexa. The
reported endometrial cancer is not well appreciated on today' s CT
scan, the endometrium itself is somewhat indistinct.

Other: No supplemental non-categorized findings.

Musculoskeletal: Mild lumbar spondylosis and degenerative disc
disease.
IMPRESSION: 1. There is a mildly enlarged right hilar lymph node at 1.1 cm, neck
given the that there are no other indicators of metastatic disease,
this is likely incidental. No worrisome pulmonary nodules
identified.
2. The endometrium is indistinct. Uterine and adnexal contours
relatively normal.
3. Asymmetric glandular tissues in the right upper breast compared
to the left. Asymmetry is often normal, but mammographic correlation
is suggested.
4. Other imaging findings of potential clinical significance: Aortic
Atherosclerosis (EX60V-VZ2.2). Calcified aortic valve with faint
calcification of the mitral valve. Cholelithiasis. Small type 1
hiatal hernia. Descending colon diverticulosis.

## 2018-10-01 NOTE — Telephone Encounter (Signed)
Requested Prescriptions  Pending Prescriptions Disp Refills  . metoprolol tartrate (LOPRESSOR) 25 MG tablet [Pharmacy Med Name: METOPROLOL TARTRATE 25 MG TAB] 180 tablet 1    Sig: TAKE 1 TABLET BY MOUTH TWICE A DAY     Cardiovascular:  Beta Blockers Failed - 10/01/2018  1:39 PM      Failed - Last BP in normal range    BP Readings from Last 1 Encounters:  08/13/18 (!) 157/82         Passed - Last Heart Rate in normal range    Pulse Readings from Last 1 Encounters:  08/13/18 69         Passed - Valid encounter within last 6 months    Recent Outpatient Visits          1 month ago Mild major depression [facility])   [facility] [place], Drue Stager, MD   8 months ago Major depression in remission [facility])   [facility] Steele Sizer, MD   1 year ago Welcome to [facility] preventive visit   [facility] Steele Sizer, MD   1 year ago Major depression in remission [facility])   [facility] Steele Sizer, MD   1 year ago S/P TAVR (transcatheter aortic valve replacement)   [facility] Steele Sizer, MD      Future Appointments            In 4 months Ancil Boozer, Drue Stager, MD [facility], [place]   In 10 months  [facility], [facility]

## 2018-10-13 DIAGNOSIS — I7410 Embolism and thrombosis of unspecified parts of aorta: Secondary | ICD-10-CM | POA: Diagnosis not present

## 2018-10-17 DIAGNOSIS — Z952 Presence of prosthetic heart valve: Secondary | ICD-10-CM | POA: Diagnosis not present

## 2018-10-29 DIAGNOSIS — Z952 Presence of prosthetic heart valve: Secondary | ICD-10-CM | POA: Diagnosis not present

## 2018-11-05 ENCOUNTER — Other Ambulatory Visit: Payer: Self-pay | Admitting: Family Medicine

## 2018-11-05 DIAGNOSIS — I7410 Embolism and thrombosis of unspecified parts of aorta: Secondary | ICD-10-CM | POA: Diagnosis not present

## 2018-11-13 DIAGNOSIS — I7410 Embolism and thrombosis of unspecified parts of aorta: Secondary | ICD-10-CM | POA: Diagnosis not present

## 2018-11-18 ENCOUNTER — Other Ambulatory Visit: Payer: Self-pay

## 2018-11-18 NOTE — Progress Notes (Unsigned)
Patient pre screened for visit. She has recently been put on Coumadin for a possible blood clot.

## 2018-11-19 ENCOUNTER — Other Ambulatory Visit: Payer: Self-pay

## 2018-11-19 ENCOUNTER — Encounter: Payer: Self-pay | Admitting: Family Medicine

## 2018-11-19 ENCOUNTER — Ambulatory Visit: Payer: Self-pay | Admitting: *Deleted

## 2018-11-19 ENCOUNTER — Encounter: Payer: Self-pay | Admitting: Emergency Medicine

## 2018-11-19 ENCOUNTER — Emergency Department: Payer: Medicare HMO

## 2018-11-19 ENCOUNTER — Emergency Department
Admission: EM | Admit: 2018-11-19 | Discharge: 2018-11-19 | Disposition: A | Discharge status: Home / Self Care | Payer: Medicare HMO | Attending: Emergency Medicine | Admitting: Emergency Medicine

## 2018-11-19 ENCOUNTER — Ambulatory Visit: Payer: Medicare HMO

## 2018-11-19 DIAGNOSIS — M1712 Unilateral primary osteoarthritis, left knee: Secondary | ICD-10-CM | POA: Diagnosis not present

## 2018-11-19 DIAGNOSIS — Z952 Presence of prosthetic heart valve: Secondary | ICD-10-CM | POA: Diagnosis not present

## 2018-11-19 DIAGNOSIS — M25562 Pain in left knee: Secondary | ICD-10-CM | POA: Diagnosis not present

## 2018-11-19 DIAGNOSIS — Z7901 Long term (current) use of anticoagulants: Secondary | ICD-10-CM | POA: Diagnosis not present

## 2018-11-19 DIAGNOSIS — Z79899 Other long term (current) drug therapy: Secondary | ICD-10-CM | POA: Insufficient documentation

## 2018-11-19 DIAGNOSIS — I10 Essential (primary) hypertension: Secondary | ICD-10-CM | POA: Diagnosis not present

## 2018-11-19 DIAGNOSIS — M2392 Unspecified internal derangement of left knee: Secondary | ICD-10-CM | POA: Insufficient documentation

## 2018-11-19 DIAGNOSIS — R52 Pain, unspecified: Secondary | ICD-10-CM | POA: Diagnosis not present

## 2018-11-19 MED ORDER — TRAMADOL HCL 50 MG PO TABS: 50.0000 mg | ORAL_TABLET | Freq: Four times a day (QID) | ORAL | 0 refills | Status: DC | PRN

## 2018-11-19 NOTE — ED Notes (Signed)
Pain left knee started last night when she went to get up from recliner.  Hurting so she could not sleep last night.

## 2018-11-19 NOTE — ED Provider Notes (Signed)
[facility] Emergency Department Provider Note  ____________________________________________   First MD Initiated Contact with Patient 11/19/18 1243     (approximate)  I have reviewed the triage vital signs and the nursing notes.   HISTORY  Chief Complaint Knee Pain    HPI Melody Soto is a 68 y.o. female presents emergency department complaining of left knee pain.  She states that she went to get up from her recliner and had a sharp pain radiating through the knee and then had a lot of swelling.  She thinks she pulled something in the knee.  Some difficulty extending the left knee.  She denies any other injuries.  She does not feel a pop or hear a pop.    Past Medical History:  Diagnosis Date  . Aortic stenosis   . Cataract   . Depression   . Endometrial cancer determined by uterine biopsy ([place]) 10/29/2016   Rad tx's and internal brachytherapy.  Marland Kitchen Heart murmur    HX OF  . Hyperlipidemia   . Hypertension    CONTROLLED ON MEDS  . Lymphedema   . Shortness of breath dyspnea     Patient Active Problem List   Diagnosis Date Noted  . Diverticulosis of colon 08/08/2018  . Cholelithiasis 08/08/2018  . Other neutropenia ([place]) 07/15/2017  . Endometrial cancer ([place]) 04/10/2017  . 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 Chronic acquired lymphedema 01/08/2017  . Obesity, morbid, BMI 40.0-49.9 ([place]) 11/13/2016  . FIGO stage II endometrial cancer ([place]) 10/29/2016  . Left ventricular hypertrophy 11/27/2014  . Lymphedema of lower extremity 08/23/2014  . Severe aortic stenosis 08/21/2014  . Benign essential HTN 08/21/2014  . Depression, major, recurrent, mild ([place]) 08/21/2014  . Dyslipidemia 08/21/2014  . Cardiac dysrhythmia 08/21/2014  . Dysmetabolic syndrome 0000000  . Symptomatic menopausal or female climacteric states 08/21/2014  . 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 History of shingles 08/21/2014  .  Personal history of skin cancer 11/24/2009    Past Surgical History:  Procedure Laterality Date  . 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 cataract surgery Bilateral 05/24/2014   second eye 06/07/2014  . MELANOMA EXCISION    . OVARIAN CYST REMOVAL     EXPLORATORY LAPAROTOMY  . TONSILLECTOMY      Prior to Admission medications   Medication Sig Start Date End Date Taking? Authorizing Provider  atorvastatin (LIPITOR) 40 MG tablet TAKE 1 TABLET (40 MG TOTAL) BY MOUTH 3 (THREE) TIMES A WEEK. 11/05/18  Yes Sowles, Drue Stager, MD  b complex vitamins tablet Take 1 tablet by mouth daily.   Yes [provider]  Calcium Carb-Cholecalciferol (CALCIUM 500 + D3) 500-200 MG-UNIT TABS Take 1 capsule by mouth 2 (two) times daily.   Yes [provider]  metoprolol tartrate (LOPRESSOR) 25 MG tablet TAKE 1 TABLET BY MOUTH TWICE A DAY 10/01/18  Yes Sowles, Drue Stager, MD  MULTIPLE VITAMIN PO Take 2 tablets by mouth 2 (two) times daily. Reported on 05/19/2015 10/10/06  Yes [provider]  telmisartan (MICARDIS) 40 MG tablet Take 1 tablet (40 mg total) by mouth daily. 08/08/18  Yes Sowles, Drue Stager, MD  vitamin B-12 (CYANOCOBALAMIN) 1000 MCG tablet Take 1,000 mcg by mouth daily.    Yes [provider]  warfarin (COUMADIN) 10 MG tablet Take 10 mg by mouth daily.   Yes [provider]  traMADol (ULTRAM) 50 MG tablet Take 1 tablet (50 mg total) by mouth every 6 (six) hours as needed.  11/19/18   Versie Starks, PA-C    Allergies Ace inhibitors and Duloxetine  Family History  Problem Relation Age of Onset  . Cancer Mother   . Stroke Mother   . Thyroid nodules Mother   . Diabetes Sister   . Hypertension Sister   . Breast cancer Maternal Aunt   . Bone cancer Maternal Uncle   . Depression Daughter   . Depression Daughter     Social History Social History   Tobacco Use  . Smoking status: Never Smoker  . Smokeless tobacco: Never Used  Substance Use Topics  .  Alcohol use: Yes    Alcohol/week: 0.0 standard drinks  . Drug use: No    Review of Systems  Constitutional: No fever/chills Eyes: No visual changes. ENT: No sore throat. Respiratory: Denies cough Genitourinary: Negative for dysuria. Musculoskeletal: Negative for back pain.  Positive for left knee pain Skin: Negative for rash.    ____________________________________________   PHYSICAL EXAM:  VITAL SIGNS: ED Triage Vitals  Enc Vitals Group     BP 11/19/18 1200 (!) 136/59     Pulse Rate 11/19/18 1200 84     Resp 11/19/18 1200 16     Temp 11/19/18 1200 99.1 F (37.3 C)     Temp Source 11/19/18 1200 Oral     SpO2 11/19/18 1200 96 %     Weight 11/19/18 1201 285 lb (129.3 kg)     Height 11/19/18 1201 5\' 8"  (1.727 m)     Head Circumference --      Peak Flow --      Pain Score 11/19/18 1201 0     Pain Loc --      Pain Edu? --      Excl. in [place]? --     Constitutional: Alert and oriented. Well appearing and in no acute distress. Eyes: Conjunctivae are normal.  Head: Atraumatic. Nose: No congestion/rhinnorhea. Mouth/Throat: Mucous membranes are moist.   Neck:  supple no lymphadenopathy noted Cardiovascular: Normal rate, regular rhythm.  Respiratory: Normal respiratory effort.  No retractions GU: deferred Musculoskeletal: Decreased range of motion of the left knee, patient was able to partially extend the knee.  Tenderness noted at the joint line with a fair amount of swelling although the patient does have lymphedema.  Neurologic:  Normal speech and language.  Skin:  Skin is warm, dry and intact. No rash noted. Psychiatric: Mood and affect are normal. Speech and behavior are normal.  ____________________________________________   LABS (all labs ordered are listed, but only abnormal results are displayed)  Labs Reviewed - No data to display ____________________________________________   ____________________________________________  RADIOLOGY  X-ray of the left  knee is negative  ____________________________________________   PROCEDURES  Procedure(s) performed: Ronnald Ramp wrap, walker Procedures    ____________________________________________   INITIAL IMPRESSION / ASSESSMENT AND PLAN / ED COURSE  Pertinent labs & imaging results that were available during my care of the patient were reviewed by me and considered in my medical decision making (see chart for details).   Patient seen a [age]-year-old female presents emergency department with acute onset of left knee pain.  See HPI  Physical exam shows the left knee to be tender and swollen.  X-ray of the left knee is negative for fracture   Explained findings to the patient.  She is to follow-up with orthopedics.  She is given a Jones wrap and a walker.  Prescription for tramadol as patient is on warfarin.  She is taking over-the-counter Tylenol.  States she understands will comply appears discharged stable condition.  She is also to apply ice to the knee.    Melody Soto was evaluated in Emergency Department on 11/19/2018 for the symptoms described in the history of present illness. She was evaluated in the context of the global COVID-19 pandemic, which necessitated consideration that the patient might be at risk for infection with the SARS-CoV-2 virus that causes COVID-19. Institutional protocols and algorithms that pertain to the evaluation of patients at risk for COVID-19 are in a state of rapid change based on information released by regulatory bodies including the CDC and federal and state organizations. These policies and algorithms were followed during the patient's care in the ED.   As part of my medical decision making, I reviewed the following data within the [place] notes reviewed and incorporated, Old chart reviewed, Radiograph reviewed x-ray of the left knee is negative, Notes from prior ED visits and [place] Controlled Substance Database   ____________________________________________   FINAL CLINICAL IMPRESSION(S) / ED DIAGNOSES  Final diagnoses:  Internal derangement of left knee      NEW MEDICATIONS STARTED DURING THIS VISIT:  Discharge Medication List as of 11/19/2018  2:30 PM    START taking these medications   Details  traMADol (ULTRAM) 50 MG tablet Take 1 tablet (50 mg total) by mouth every 6 (six) hours as needed., Starting Wed 11/19/2018, Normal         Note:  This document was prepared using Dragon voice recognition software and may include unintentional dictation errors.    Versie Starks, PA-C 11/19/18 1522    Carrie Mew, MD 11/20/18 [phone]

## 2018-11-19 NOTE — Discharge Instructions (Signed)
Follow-up with Dr. Rudene Christians.  Please call for an appointment.  Take Tylenol arthritis for pain.  Tramadol for pain not controlled by Tylenol.  Apply ice to the knee.  Return if worsening

## 2018-11-19 NOTE — ED Notes (Addendum)
Pt provided with walker to stand from bed to toilet. Pt transported to toilet in wheelchair by this RN. This RN educated pt to use walker. Pt verbalizes understaing.

## 2018-11-19 NOTE — ED Triage Notes (Signed)
Pt in via POV, states, "I pulled something in my knee last night when I got up."  Denies any fall, denies injury to left knee in the past.  NAD noted at this time.

## 2018-11-19 NOTE — Telephone Encounter (Signed)
Patient had disconnected before transfer to NT. TC to patient. No answer, left VM to return call if needed.

## 2018-11-19 NOTE — Telephone Encounter (Signed)
Patient has had communication via MyChart with Dr. Ancil Boozer. Referred patient to [facility].

## 2018-11-24 DIAGNOSIS — M1712 Unilateral primary osteoarthritis, left knee: Secondary | ICD-10-CM | POA: Diagnosis not present

## 2018-12-10 ENCOUNTER — Inpatient Hospital Stay: Payer: Medicare HMO | Attending: Obstetrics and Gynecology | Admitting: Obstetrics and Gynecology

## 2018-12-10 ENCOUNTER — Other Ambulatory Visit: Payer: Self-pay

## 2018-12-10 VITALS — BP 138/56 | HR 78 | Temp 97.9°F | Resp 16 | Wt 287.2 lb

## 2018-12-10 DIAGNOSIS — E785 Hyperlipidemia, unspecified: Secondary | ICD-10-CM | POA: Insufficient documentation

## 2018-12-10 DIAGNOSIS — I350 Nonrheumatic aortic (valve) stenosis: Secondary | ICD-10-CM | POA: Diagnosis not present

## 2018-12-10 DIAGNOSIS — Z7901 Long term (current) use of anticoagulants: Secondary | ICD-10-CM | POA: Diagnosis not present

## 2018-12-10 DIAGNOSIS — I10 Essential (primary) hypertension: Secondary | ICD-10-CM | POA: Insufficient documentation

## 2018-12-10 DIAGNOSIS — I517 Cardiomegaly: Secondary | ICD-10-CM | POA: Insufficient documentation

## 2018-12-10 DIAGNOSIS — Z6841 Body Mass Index (BMI) 40.0 and over, adult: Secondary | ICD-10-CM | POA: Diagnosis not present

## 2018-12-10 DIAGNOSIS — I890 Lymphedema, not elsewhere classified: Secondary | ICD-10-CM | POA: Diagnosis not present

## 2018-12-10 DIAGNOSIS — Z79899 Other long term (current) drug therapy: Secondary | ICD-10-CM | POA: Insufficient documentation

## 2018-12-10 DIAGNOSIS — Z923 Personal history of irradiation: Secondary | ICD-10-CM | POA: Diagnosis not present

## 2018-12-10 DIAGNOSIS — N393 Stress incontinence (female) (male): Secondary | ICD-10-CM | POA: Insufficient documentation

## 2018-12-10 DIAGNOSIS — I499 Cardiac arrhythmia, unspecified: Secondary | ICD-10-CM | POA: Insufficient documentation

## 2018-12-10 DIAGNOSIS — E669 Obesity, unspecified: Secondary | ICD-10-CM | POA: Diagnosis not present

## 2018-12-10 DIAGNOSIS — Z952 Presence of prosthetic heart valve: Secondary | ICD-10-CM | POA: Insufficient documentation

## 2018-12-10 DIAGNOSIS — R918 Other nonspecific abnormal finding of lung field: Secondary | ICD-10-CM | POA: Insufficient documentation

## 2018-12-10 DIAGNOSIS — R590 Localized enlarged lymph nodes: Secondary | ICD-10-CM | POA: Insufficient documentation

## 2018-12-10 DIAGNOSIS — C541 Malignant neoplasm of endometrium: Secondary | ICD-10-CM | POA: Insufficient documentation

## 2018-12-10 NOTE — Progress Notes (Signed)
Pt in for follow up, denies any concerns today. 

## 2018-12-10 NOTE — Progress Notes (Signed)
Gynecologic Oncology Interval Visit   Referring Provider: Will Bonnet,  MD  Chief Concern: Grade 1 endometrial cancer  Subjective:  Melody Soto is a 68 y.o. female G2P2, initially seen in consultation for Dr. 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 She is s/p prior XL ovarian cystectomy for benign disease. She returns for follow up.   She has not had any further bleeding.   Gynecologic Oncology History Melody Soto is a pleasant female G2P2 s/p prior XL ovarian cystectomy for benign disease who is seen in consultation from Dr. Glennon Mac for grade 1 endometrial cancer.  10/17/2016 Endometrial biopsy and biopsy of cervical polyp  Part A: ENDOMETRIUM, BIOPSY:  WELL DIFFERENTIATED ENDOMETRIAL ADENOCARCINOMA (FIGO I). COMMENT: THIS CASE WAS REVIEWED IN INTRADEPARTMENTAL CONSULTATION AND THE DIAGNOSIS REFLECTS OUR CONSENSUS OPINION.DR,JACKSON'S OFFICE WAS INFORMED OF DIAGNOSIS ON 10/19/16 AT 1.10 PM.  Part B: ENDOCERVIX, POLYP:  MINUTE FRAGMENTS OFOF ADENOCARCINOMA PROBABLY FROM ENDOMETRIUM.  MINUTE FRAGMENTS OF BENIGN ENDOCERVICAL GLANDS, MUCUS, AND BLOOD.  Pap unsatisfactory  10/29/2017 Pelvic ultrasound Uterus 7.3 x 5 x 5 cm with small leiomyomas Bilateral ovaries normal. Endometrial stripe 7.1 mm  CT C/A/P 11/20/2016 IMPRESSION: 1. There is a mildly enlarged right hilar lymph node at 1.1 cm, neck given the that there are no other indicators of metastatic disease, this is likely incidental. No worrisome pulmonary nodules identified. 2. The endometrium is indistinct. Uterine and adnexal contours relatively normal. 3. Asymmetric glandular tissues in the right upper breast compared to the left. Asymmetry is often normal, but mammographic correlation is suggested. 4. Other imaging findings of potential clinical significance: Aortic Atherosclerosis (ICD10-I70.0). Calcified aortic valve with faint calcification of the mitral valve.  Cholelithiasis. Small type 1 hiatal hernia. Descending colon diverticulosis.  She was not a candidate for surgical management due to cardiac disease. She underwent primary radiation.   She underwent WPRT (45 Gy over 5 weeks) with Dr. Baruch G[facility]y at [facility] followed by vaginal cuff brachytherapy with Dr. Christel Mormon at [facility]. She completed high-dose rate Brachytherapy to the cervix (treatment 5 of planned 5) on 04/02/2017.  04/16/3017 Mammogram negative   05/09/3017 CT chest wo contrast IMPRESSION: 1. Stable small scattered mediastinal and hilar lymph nodes. No mass or adenopathy. No new or progressive findings. 2. Stable small pulmonary nodules. Recommend follow-up noncontrast chest CT in 1 year. 3. Stable mild emphysematous changes.  07/31/3017 CT Abdomen Pelvis W Contrast IMPRESSION: Negative. No evidence of recurrent or metastatic carcinoma within the abdomen or pelvis. Cholelithiasis.  No radiographic evidence of cholecystitis. Mild hepatic steatosis. Colonic diverticulosis, without radiographic evidence of diverticulitis.  03/2018 she had noticed some vaginal spotting and evaluation included CT scan, [place], and EMBX.   04/11/2018 CT C/A/P IMPRESSION: 1. Stable exam. No new or progressive finding since prior chest CT and abdomen/pelvis CT. 2. Multiple bilateral pulmonary nodules measuring 5 mm less, stable in the 1 year interval since 05/09/2017 suggesting benign etiology. Continued attention on follow-up recommended 3. Cholelithiasis. 4. Colonic diverticulosis without diverticulitis.  04/21/2018 Pelvic US Uterus: Measurements: 5.9 x 4.3 x 5.3 cm = volume: 69.5 mL. 11 x 10 x 18 mm subserosal fibroid in the anterior lower uterine segment. Endometrium Thickness: 8 mm.  No focal abnormality visualized. Bilateral ovaries: No adnexal mass is seen.  04/23/2018   A. ENDOMETRIUM; BIOPSY:  - EXTREMELY SCANT BENIGN SQUAMOUS AND ENDOCERVICAL EPITHELIUM.  - NO ENDOMETRIAL TISSUE IS PRESENT.  We recommended repeat  ultrasound and if EMS stable or increase we would repeat  EMBx since the biopsy was not adequate for evaluation of the endometrial cavity.     06/17/2018 Pelvic Ultrasound FINDINGS: Uterus: Measurements: 6.3 x 3.8 x 5.0 cm = volume: 62.2 mL. 1.2 x 0.9 x 1.3 cm exophytic fibroid present at the level of the lower uterine segment.  Endometrium Thickness: 7.3 mm. No focal abnormality visualized. No increased vascularity.  Bilateral ovaries: Not visualized.  No adnexal mass.   Trace free fluid within the pelvis.  IMPRESSION: 1. Endometrial complex measures 7.3 mm in thickness without focalabnormality. 2. 1.3 cm fibroid arising from the lower uterine segment, stable.      Problem List: Patient Active Problem List   Diagnosis Date Noted  . Diverticulosis of colon 08/08/2018  . Cholelithiasis 08/08/2018  . Other neutropenia ([place]) 07/15/2017  . Endometrial cancer ([place]) 04/10/2017  . 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 Chronic acquired lymphedema 01/08/2017  . Obesity, morbid, BMI 40.0-49.9 ([place]) 11/13/2016  . FIGO stage II endometrial cancer ([place]) 10/29/2016  . Left ventricular hypertrophy 11/27/2014  . Lymphedema of lower extremity 08/23/2014  . Severe aortic stenosis 08/21/2014  . Benign essential HTN 08/21/2014  . Depression, major, recurrent, mild ([place]) 08/21/2014  . Dyslipidemia 08/21/2014  . Cardiac dysrhythmia 08/21/2014  . Dysmetabolic syndrome 0000000  . Symptomatic menopausal or female climacteric states 08/21/2014  . 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 History of shingles 08/21/2014  . Personal history of skin cancer 11/24/2009    Past Medical History: Past Medical History:  Diagnosis Date  . Aortic stenosis   . Cataract   . Depression   . Endometrial cancer determined by uterine biopsy ([place]) 10/29/2016   Rad tx's and internal brachytherapy.  Marland Kitchen Heart murmur    HX OF  . Hyperlipidemia   . Hypertension    CONTROLLED  ON MEDS  . Lymphedema   . Shortness of breath dyspnea     Past Surgical History: Past Surgical History:  Procedure Laterality Date  . 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 cataract surgery Bilateral 05/24/2014   second eye 06/07/2014  . MELANOMA EXCISION    . OVARIAN CYST REMOVAL     EXPLORATORY LAPAROTOMY  . TONSILLECTOMY      Past Gynecologic History:  Last Menstrual Period:  2016; postmenopausal Last pap: 2018   OB History:  OB History  Gravida Para Term Preterm AB Living  2 2       2   SAB TAB Ectopic Multiple Live Births               # Outcome Date GA Lbr Len/2nd Weight Sex Delivery Anes PTL Lv  2 Para           1 Para             Obstetric Comments  7 pregancies; 2 NSVDs    Family History: Family History  Problem Relation Age of Onset  . Cancer Mother   . Stroke Mother   . Thyroid nodules Mother   . Diabetes Sister   . Hypertension Sister   . Breast cancer Maternal Aunt   . Bone cancer Maternal Uncle   . Depression Daughter   . Depression Daughter     Social History: Social History   Socioeconomic History  . Marital status: Divorced    Spouse name: Not on file  . Number of children: 2  . Years of education: Not on file  . Highest education level: Bachelor's degree (e.g., BA, AB, BS)  Occupational History  .  Not on file  Social Needs  . Financial resource strain: Somewhat hard  . Food insecurity    Worry: Never true    Inability: Never true  . Transportation needs    Medical: No    Non-medical: No  Tobacco Use  . Smoking status: Never Smoker  . Smokeless tobacco: Never Used  Substance and Sexual Activity  . Alcohol use: Yes    Alcohol/week: 0.0 standard drinks  . Drug use: No  . Sexual activity: Not Currently    Birth control/protection: Post-menopausal  Lifestyle  . Physical activity    Days per week: 0 days    Minutes per session: 0 min  . Stress: Not at all  Relationships  . Social connections    Talks on phone:  More than three times a week    Gets together: Twice a week    Attends religious service: More than 4 times per year    Active member of club or organization: Yes    Attends meetings of clubs or organizations: More than 4 times per year    Relationship status: Divorced  . Intimate partner violence    Fear of current or ex partner: No    Emotionally abused: No    Physically abused: No    Forced sexual activity: No  Other Topics Concern  . Not on file  Social History Narrative  . Not on file    Allergies: Allergies  Allergen Reactions  . Ace Inhibitors Other (See Comments)    UNKNOWN REACTION  . Duloxetine Other (See Comments)    Drowsiness    Current Medications: Current Outpatient Medications  Medication Sig Dispense Refill  . atorvastatin (LIPITOR) 40 MG tablet TAKE 1 TABLET (40 MG TOTAL) BY MOUTH 3 (THREE) TIMES A WEEK. 36 tablet 0  . b complex vitamins tablet Take 1 tablet by mouth daily.    . Calcium Carb-Cholecalciferol (CALCIUM 500 + D3) 500-200 MG-UNIT TABS Take 1 capsule by mouth 2 (two) times daily.    . metoprolol tartrate (LOPRESSOR) 25 MG tablet TAKE 1 TABLET BY MOUTH TWICE A DAY 180 tablet 1  . MULTIPLE VITAMIN PO Take 2 tablets by mouth 2 (two) times daily. Reported on 05/19/2015    . telmisartan (MICARDIS) 40 MG tablet Take 1 tablet (40 mg total) by mouth daily. 90 tablet 1  . vitamin B-12 (CYANOCOBALAMIN) 1000 MCG tablet Take 1,000 mcg by mouth daily.     Marland Kitchen warfarin (COUMADIN) 10 MG tablet Take 10 mg by mouth daily.    . traMADol (ULTRAM) 50 MG tablet Take 1 tablet (50 mg total) by mouth every 6 (six) hours as needed. (Patient not taking: Reported on 12/10/2018) 15 tablet 0   No current facility-administered medications for this visit.    Review of Systems General: no complaints  HEENT: no complaints  Lungs: no complaints  Cardiac: no complaints  GI: no complaints  GU: no complaints  Musculoskeletal: no complaints  Extremities: no complaints  Skin: no  complaints  Neuro: no complaints  Endocrine: no complaints  Psych: no complaints       Objective:  Physical Examination:  BP (!) 138/56 (BP Location: Right Arm, Patient Position: Sitting)   Pulse 78   Temp 97.9 F (36.6 C)   Resp 16   Wt 287 lb 3.2 oz (130.3 kg)   SpO2 98%   BMI 43.67 kg/m    ECOG Performance Status: 1 - Symptomatic but completely ambulatory   GENERAL: Patient is a well appearing  female in no acute distress HEENT:  PERRL, neck supple with midline trachea. Thyroid without masses.  NODES:  No cervical, supraclavicular, axillary, or inguinal lymphadenopathy palpated.  LUNGS:  Clear to auscultation bilaterally.  No wheezes or rhonchi. HEART:  Regular rate and rhythm. No murmur appreciated. ABDOMEN:  Soft, nontender.  Positive, normoactive bowel sounds.  MSK:  No focal spinal tenderness to palpation. Full range of motion bilaterally in the upper extremities. EXTREMITIES:  No peripheral edema.   SKIN:  Clear with no obvious rashes or skin changes. No nail dyscrasia. NEURO:  Nonfocal. Well oriented.  Appropriate affect.  Pelvic: EGBUS: no lesions Cervix: no lesions, nontender, mobile Vagina: no lesions, no discharge or bleeding Uterus: normal size, nontender, mobile Adnexa: no palpable masses Rectovaginal: deferred      Assessment:  Melody Soto is a 68 y.o. female diagnosed with possible stage II grade 1 endometrial cancer with involvement of a cervical polyp s/p primary radiation therapy. NED on exam, however, spotting worrisome for disease recurrence.  Imaging unremarkable other than thickened EMS. EMBx obtained with scant tissue, repeat imaging decreased endometrial stripe 06/2018 and no further bleeding episodes. Exam benign.   Stable small pulmonary nodules on imaging - repeat CT chest 03/2018 stable.   Enlarged hilar node - no evidence of progression on imaging.   Asymmetric breast tissue - negative mammogram, recommended she follow up with her  screening mammogram.   S/P TAVR (transcatheter aortic valve replacement)   Medical co-morbidities complicating care: left ventricular hypertrophy, severe aortic stenosis, cardiac dysrhythmia, hyperlipidemia,, HTN and obesity.    Plan:   Problem List Items Addressed This Visit      Other   FIGO stage II endometrial cancer ([place]) - Primary   Relevant Orders   US PELVIC COMPLETE WITH TRANSVAGINAL      Plan to repeat pelvic ultrasound in November 2020, which will be 6 months since her scan in May. She will contact us if she has any concerning symptoms. She will also alternate visit with Dr. Baruch Gouty 04/10/19 and then again in our clinic May 2021.   I personally saw and evaluated the patient, provided cousneling and determined the management and treatment plan as documented.  A total of 25 minutes were spent with the patient/family today; >50% was spent in education, counseling and coordination of care for endometrial cancer.  I personally had a face to face interaction and evaluated the patient jointly with the NP, Ms. Beckey Rutter.  I have reviewed her history and available records and have performed the key portions of the physical exam including inguinal lymph node survey, abdominal exam, pelvic exam with my findings confirming those documented above by the APP.  I have discussed the case with the APP and the patient.  I agree with the above documentation, assessment and plan which was fully formulated by me.  Counseling was completed by me.   I personally saw the patient and performed a substantive portion of this encounter in conjunction with the listed APP as documented above.  [place] Gaetana Michaelis, MD  ANGELES Gaetana Michaelis, MD    CC:  Will Bonnet,  MD

## 2018-12-10 NOTE — Patient Instructions (Signed)
Elastic Therapy in [place], [place]

## 2018-12-18 ENCOUNTER — Ambulatory Visit
Admission: RE | Admit: 2018-12-18 | Discharge: 2018-12-18 | Disposition: A | Discharge status: Home / Self Care | Payer: Medicare HMO | Source: Ambulatory Visit | Attending: Nurse Practitioner | Admitting: Nurse Practitioner

## 2018-12-18 ENCOUNTER — Other Ambulatory Visit: Payer: Self-pay

## 2018-12-18 DIAGNOSIS — D259 Leiomyoma of uterus, unspecified: Secondary | ICD-10-CM | POA: Diagnosis not present

## 2018-12-18 DIAGNOSIS — C541 Malignant neoplasm of endometrium: Secondary | ICD-10-CM | POA: Insufficient documentation

## 2018-12-24 ENCOUNTER — Inpatient Hospital Stay: Payer: Medicare HMO

## 2018-12-24 ENCOUNTER — Other Ambulatory Visit: Payer: Self-pay

## 2019-01-01 DIAGNOSIS — Z7902 Long term (current) use of antithrombotics/antiplatelets: Secondary | ICD-10-CM | POA: Diagnosis not present

## 2019-01-01 DIAGNOSIS — I7410 Embolism and thrombosis of unspecified parts of aorta: Secondary | ICD-10-CM | POA: Diagnosis not present

## 2019-01-01 DIAGNOSIS — I351 Nonrheumatic aortic (valve) insufficiency: Secondary | ICD-10-CM | POA: Diagnosis not present

## 2019-01-01 DIAGNOSIS — I890 Lymphedema, not elsewhere classified: Secondary | ICD-10-CM | POA: Diagnosis not present

## 2019-01-01 DIAGNOSIS — Z952 Presence of prosthetic heart valve: Secondary | ICD-10-CM | POA: Diagnosis not present

## 2019-01-01 DIAGNOSIS — I517 Cardiomegaly: Secondary | ICD-10-CM | POA: Diagnosis not present

## 2019-01-01 DIAGNOSIS — I10 Essential (primary) hypertension: Secondary | ICD-10-CM | POA: Diagnosis not present

## 2019-01-01 DIAGNOSIS — I119 Hypertensive heart disease without heart failure: Secondary | ICD-10-CM | POA: Diagnosis not present

## 2019-01-01 DIAGNOSIS — Z79899 Other long term (current) drug therapy: Secondary | ICD-10-CM | POA: Diagnosis not present

## 2019-01-01 DIAGNOSIS — E782 Mixed hyperlipidemia: Secondary | ICD-10-CM | POA: Diagnosis not present

## 2019-01-06 DIAGNOSIS — Z7901 Long term (current) use of anticoagulants: Secondary | ICD-10-CM | POA: Insufficient documentation

## 2019-01-06 DIAGNOSIS — Z5181 Encounter for therapeutic drug level monitoring: Secondary | ICD-10-CM | POA: Insufficient documentation

## 2019-01-29 ENCOUNTER — Other Ambulatory Visit: Payer: Self-pay | Admitting: Family Medicine

## 2019-01-29 NOTE — Telephone Encounter (Signed)
Requested Prescriptions  Pending Prescriptions Disp Refills  . atorvastatin (LIPITOR) 40 MG tablet [Pharmacy Med Name: ATORVASTATIN 40 MG TABLET] 36 tablet 0    Sig: TAKE 1 TABLET (40 MG TOTAL) BY MOUTH 3 (THREE) TIMES A WEEK.     Cardiovascular:  Antilipid - Statins Failed - 01/29/2019  3:50 AM      Failed - Total Cholesterol in normal range and within 360 days    Cholesterol, Total  Date Value Ref Range Status  05/17/2015 203 (H) 100 - 199 mg/dL Final   Cholesterol  Date Value Ref Range Status  08/08/2018 205 (H) <200 mg/dL Final         Failed - LDL in normal range and within 360 days    LDL Cholesterol (Calc)  Date Value Ref Range Status  08/08/2018 125 (H) mg/dL (calc) Final    Comment:    Reference range: <100 . Desirable range <100 mg/dL for primary prevention;   <70 mg/dL for patients with CHD or diabetic patients  with > or = 2 CHD risk factors. Marland Kitchen LDL-C is now calculated using the Martin-Hopkins  calculation, which is a validated novel method providing  better accuracy than the Friedewald equation in the  estimation of LDL-C.  Cresenciano Genre et al. Annamaria Helling. [id number]): 2061-2068  (http://education.[facility].com/faq/FAQ164)          Failed - HDL in normal range and within 360 days    HDL  Date Value Ref Range Status  08/08/2018 48 (L) > OR = 50 mg/dL Final  05/17/2015 47 >39 mg/dL Final         Failed - Triglycerides in normal range and within 360 days    Triglycerides  Date Value Ref Range Status  08/08/2018 200 (H) <150 mg/dL Final    Comment:    . If a non-fasting specimen was collected, consider repeat triglyceride testing on a fasting specimen if clinically indicated.  Yates Decamp et al. J. of Clin. Lipidol. [id number]. Marland Kitchen          Passed - Patient is not pregnant      Passed - Valid encounter within last 12 months    Recent Outpatient Visits          5 months ago Mild major depression [facility])   [facility] Steele Sizer, MD   1 year ago Major depression in remission [facility])   [facility] Steele Sizer, MD   1 year ago [facility] to [facility] preventive visit   [facility] Steele Sizer, MD   1 year ago Major depression in remission [facility])   [facility] Steele Sizer, MD   1 year ago S/P TAVR (transcatheter aortic valve replacement)   [facility] Steele Sizer, MD      Future Appointments            In 6 months [place]

## 2019-01-30 ENCOUNTER — Ambulatory Visit: Payer: Medicare HMO | Admitting: Family Medicine

## 2019-03-06 DIAGNOSIS — G4733 Obstructive sleep apnea (adult) (pediatric): Secondary | ICD-10-CM | POA: Diagnosis not present

## 2019-03-06 DIAGNOSIS — I208 Other forms of angina pectoris: Secondary | ICD-10-CM | POA: Diagnosis not present

## 2019-03-06 DIAGNOSIS — I10 Essential (primary) hypertension: Secondary | ICD-10-CM | POA: Diagnosis not present

## 2019-03-06 DIAGNOSIS — Z7901 Long term (current) use of anticoagulants: Secondary | ICD-10-CM | POA: Diagnosis not present

## 2019-03-06 DIAGNOSIS — Z952 Presence of prosthetic heart valve: Secondary | ICD-10-CM | POA: Diagnosis not present

## 2019-03-06 DIAGNOSIS — Z5181 Encounter for therapeutic drug level monitoring: Secondary | ICD-10-CM | POA: Diagnosis not present

## 2019-03-06 DIAGNOSIS — R011 Cardiac murmur, unspecified: Secondary | ICD-10-CM | POA: Diagnosis not present

## 2019-03-06 DIAGNOSIS — E782 Mixed hyperlipidemia: Secondary | ICD-10-CM | POA: Diagnosis not present

## 2019-03-06 DIAGNOSIS — I890 Lymphedema, not elsewhere classified: Secondary | ICD-10-CM | POA: Diagnosis not present

## 2019-03-19 ENCOUNTER — Other Ambulatory Visit: Payer: Self-pay | Admitting: Family Medicine

## 2019-03-19 DIAGNOSIS — I10 Essential (primary) hypertension: Secondary | ICD-10-CM

## 2019-03-29 ENCOUNTER — Other Ambulatory Visit: Payer: Self-pay | Admitting: Family Medicine

## 2019-03-29 DIAGNOSIS — I10 Essential (primary) hypertension: Secondary | ICD-10-CM

## 2019-04-10 ENCOUNTER — Ambulatory Visit: Payer: Medicare HMO | Admitting: Radiation Oncology

## 2019-04-20 ENCOUNTER — Other Ambulatory Visit: Payer: Self-pay | Admitting: Family Medicine

## 2019-04-20 NOTE — Telephone Encounter (Signed)
Requested Prescriptions  Pending Prescriptions Disp Refills  . atorvastatin (LIPITOR) 40 MG tablet [Pharmacy Med Name: ATORVASTATIN 40 MG TABLET] 36 tablet 0    Sig: TAKE 1 TABLET (40 MG TOTAL) BY MOUTH 3 (THREE) TIMES A WEEK.     Cardiovascular:  Antilipid - Statins Failed - 04/20/2019  1:04 AM      Failed - Total Cholesterol in normal range and within 360 days    Cholesterol, Total  Date Value Ref Range Status  05/17/2015 203 (H) 100 - 199 mg/dL Final   Cholesterol  Date Value Ref Range Status  08/08/2018 205 (H) <200 mg/dL Final         Failed - LDL in normal range and within 360 days    LDL Cholesterol (Calc)  Date Value Ref Range Status  08/08/2018 125 (H) mg/dL (calc) Final    Comment:    Reference range: <100 . Desirable range <100 mg/dL for primary prevention;   <70 mg/dL for patients with CHD or diabetic patients  with > or = 2 CHD risk factors. Marland Kitchen LDL-C is now calculated using the Martin-Hopkins  calculation, which is a validated novel method providing  better accuracy than the Friedewald equation in the  estimation of LDL-C.  Cresenciano Genre et al. Annamaria Helling. [id number]): 2061-2068  (http://education.[facility].com/faq/FAQ164)          Failed - HDL in normal range and within 360 days    HDL  Date Value Ref Range Status  08/08/2018 48 (L) > OR = 50 mg/dL Final  05/17/2015 47 >39 mg/dL Final         Failed - Triglycerides in normal range and within 360 days    Triglycerides  Date Value Ref Range Status  08/08/2018 200 (H) <150 mg/dL Final    Comment:    . If a non-fasting specimen was collected, consider repeat triglyceride testing on a fasting specimen if clinically indicated.  Yates Decamp et al. J. of Clin. Lipidol. [id number]. Marland Kitchen          Passed - Patient is not pregnant      Passed - Valid encounter within last 12 months    Recent Outpatient Visits          8 months ago Mild major depression [facility])   [facility] Steele Sizer,  MD   1 year ago Major depression in remission [facility])   [facility] Steele Sizer, MD   1 year ago [facility] to [facility] preventive visit   [facility] Steele Sizer, MD   1 year ago Major depression in remission [facility])   [facility] Steele Sizer, MD   2 years ago S/P TAVR (transcatheter aortic valve replacement)   [facility] Steele Sizer, MD      Future Appointments            In 3 months [place]

## 2019-04-28 ENCOUNTER — Other Ambulatory Visit: Payer: Self-pay | Admitting: Family Medicine

## 2019-04-28 DIAGNOSIS — I10 Essential (primary) hypertension: Secondary | ICD-10-CM

## 2019-04-28 NOTE — Telephone Encounter (Signed)
Requested medication (s) are due for refill today: yes  Requested medication (s) are on the active medication list: yes  Last refill:  03/29/19  Future visit scheduled: no  Notes to clinic:  no valid encounter within last 6 months; see telephone encounter dated 04/28/19    Requested Prescriptions  Pending Prescriptions Disp Refills   metoprolol tartrate (LOPRESSOR) 25 MG tablet [Pharmacy Med Name: METOPROLOL TARTRATE 25 MG TAB] 60 tablet 0    Sig: TAKE 1 TABLET BY MOUTH TWICE A DAY      Cardiovascular:  Beta Blockers Failed - 04/28/2019 10:41 AM      Failed - Valid encounter within last 6 months    Recent Outpatient Visits           8 months ago Mild major depression [facility])   [facility] Steele Sizer, MD   1 year ago Major depression in remission [facility])   [facility] Steele Sizer, MD   1 year ago [facility] to [facility] preventive visit   [facility] Steele Sizer, MD   1 year ago Major depression in remission [facility])   [facility] Steele Sizer, MD   2 years ago S/P TAVR (transcatheter aortic valve replacement)   [facility] Steele Sizer, MD       Future Appointments             In 3 months [place] BP in normal range    BP Readings from Last 1 Encounters:  12/10/18 (!) 138/56          Passed - Last Heart Rate in normal range    Pulse Readings from Last 1 Encounters:  12/10/18 78

## 2019-04-28 NOTE — Telephone Encounter (Signed)
Refill request for Metoprolol; last refill 03/29/19; no valid encounter within last 6 months; no upcoming visits noted; pharmacy note dated 03/29/19 states office visit needed; attempted to contact pt; message states mailbox full and unable to accept messages; will route to office for final disposition. Requested medication (s) are due for refill today: yes  Requested medication (s) are on the active medication list: yes  Last refill: 03/29/19  Future visit scheduled: no  Notes to clinic: no valid encounter within last 6 months

## 2019-04-28 NOTE — Telephone Encounter (Signed)
appt sch for 3.24.2021 with  Dr Ancil Boozer

## 2019-05-01 ENCOUNTER — Other Ambulatory Visit: Payer: Self-pay

## 2019-05-01 ENCOUNTER — Ambulatory Visit
Admission: RE | Admit: 2019-05-01 | Discharge: 2019-05-01 | Disposition: A | Discharge status: Home / Self Care | Payer: Medicare HMO | Source: Ambulatory Visit | Attending: Radiation Oncology | Admitting: Radiation Oncology

## 2019-05-01 ENCOUNTER — Encounter: Payer: Self-pay | Admitting: Radiation Oncology

## 2019-05-01 VITALS — BP 145/74 | HR 74 | Temp 96.9°F | Resp 18 | Wt 293.3 lb

## 2019-05-01 DIAGNOSIS — Z8542 Personal history of malignant neoplasm of other parts of uterus: Secondary | ICD-10-CM | POA: Diagnosis not present

## 2019-05-01 DIAGNOSIS — C541 Malignant neoplasm of endometrium: Secondary | ICD-10-CM

## 2019-05-01 DIAGNOSIS — Z923 Personal history of irradiation: Secondary | ICD-10-CM | POA: Diagnosis not present

## 2019-05-01 NOTE — Progress Notes (Signed)
Radiation Oncology Follow up Note  Name: Melody Soto   Date:   05/01/2019 MRN:  [id number] DOB: [birth year]    This 69 y.o. female presents to the clinic today for 2-year follow-up status post both external beam radiation therapy as well as vaginal brachytherapy for stage IIa well-differentiated endometrial adenocarcinoma.  REFERRING PROVIDER: Steele Sizer, MD  HPI: Patient is a 69 year old female now out 2 years having completed both external beam radiation therapy as well as brachytherapy for stage II well-differentiated endometrial carcinoma seen today in routine follow-up she is doing well she specifically denies any vaginal discharge bleeding any increased lower urinary tract symptoms diarrhea or fatigue..  She had pelvic ultrasound back in November showing endometrial complex measuring 5.4 mm in thickness without focal local abnormality.  She states she is somewhat worried about her bone density and its radiation effects have assured her that radiation did not cause any osteopenia.  I suggested when she sees Dr. Theora Gianotti to have a bone density study ordered with possible recommendations following that study.  COMPLICATIONS OF TREATMENT: none  FOLLOW UP COMPLIANCE: keeps appointments   PHYSICAL EXAM:  BP (!) 145/74 (BP Location: Left Arm, Patient Position: Sitting)   Pulse 74   Temp (!) 96.9 F (36.1 C) (Tympanic)   Resp 18   Wt 293 lb 4.8 oz (133 kg)   BMI 44.60 kg/m  Well-developed well-nourished patient in NAD. HEENT reveals PERLA, EOMI, discs not visualized.  Oral cavity is clear. No oral mucosal lesions are identified. Neck is clear without evidence of cervical or supraclavicular adenopathy. Lungs are clear to A&P. Cardiac examination is essentially unremarkable with regular rate and rhythm without murmur rub or thrill. Abdomen is benign with no organomegaly or masses noted. Motor sensory and DTR levels are equal and symmetric in the upper and lower extremities. Cranial  nerves II through XII are grossly intact. Proprioception is intact. No peripheral adenopathy or edema is identified. No motor or sensory levels are noted. Crude visual fields are within normal range.  RADIOLOGY RESULTS: Ultrasound reviewed compatible with above-stated findings  PLAN: Present time patient continues to do well with no evidence of disease.  I am pleased with her overall progress.  I have again I recommended possible bone density study for possible osteopenia.  I have asked to see her back in 1 year for follow-up.  She is scheduled to see Dr. Theora Gianotti about a month at which time she will have a pelvic examination.  Patient knows to call with any concerns at any time.  I would like to take this opportunity to thank you for allowing me to participate in the care of your patient.Noreene Filbert, MD

## 2019-05-06 ENCOUNTER — Ambulatory Visit (INDEPENDENT_AMBULATORY_CARE_PROVIDER_SITE_OTHER): Payer: Medicare HMO | Admitting: Family Medicine

## 2019-05-06 ENCOUNTER — Other Ambulatory Visit: Payer: Self-pay

## 2019-05-06 ENCOUNTER — Encounter: Payer: Self-pay | Admitting: Family Medicine

## 2019-05-06 DIAGNOSIS — I7410 Embolism and thrombosis of unspecified parts of aorta: Secondary | ICD-10-CM | POA: Diagnosis not present

## 2019-05-06 DIAGNOSIS — R718 Other abnormality of red blood cells: Secondary | ICD-10-CM | POA: Diagnosis not present

## 2019-05-06 DIAGNOSIS — R739 Hyperglycemia, unspecified: Secondary | ICD-10-CM | POA: Diagnosis not present

## 2019-05-06 DIAGNOSIS — I890 Lymphedema, not elsewhere classified: Secondary | ICD-10-CM

## 2019-05-06 DIAGNOSIS — Z8542 Personal history of malignant neoplasm of other parts of uterus: Secondary | ICD-10-CM

## 2019-05-06 DIAGNOSIS — I517 Cardiomegaly: Secondary | ICD-10-CM | POA: Diagnosis not present

## 2019-05-06 DIAGNOSIS — Z952 Presence of prosthetic heart valve: Secondary | ICD-10-CM | POA: Diagnosis not present

## 2019-05-06 DIAGNOSIS — I10 Essential (primary) hypertension: Secondary | ICD-10-CM

## 2019-05-06 DIAGNOSIS — F320 Major depressive disorder, single episode, mild: Secondary | ICD-10-CM | POA: Diagnosis not present

## 2019-05-06 DIAGNOSIS — Z1211 Encounter for screening for malignant neoplasm of colon: Secondary | ICD-10-CM

## 2019-05-06 DIAGNOSIS — E785 Hyperlipidemia, unspecified: Secondary | ICD-10-CM

## 2019-05-06 DIAGNOSIS — Z79899 Other long term (current) drug therapy: Secondary | ICD-10-CM | POA: Diagnosis not present

## 2019-05-06 DIAGNOSIS — E8881 Metabolic syndrome: Secondary | ICD-10-CM

## 2019-05-06 DIAGNOSIS — R69 Illness, unspecified: Secondary | ICD-10-CM | POA: Diagnosis not present

## 2019-05-06 MED ORDER — ATORVASTATIN CALCIUM 40 MG PO TABS: 40.0000 mg | ORAL_TABLET | ORAL | 0 refills | Status: DC

## 2019-05-06 MED ORDER — METOPROLOL TARTRATE 25 MG PO TABS: 25.0000 mg | ORAL_TABLET | Freq: Two times a day (BID) | ORAL | 3 refills | Status: DC

## 2019-05-06 MED ORDER — TELMISARTAN 40 MG PO TABS: 40.0000 mg | ORAL_TABLET | Freq: Every day | ORAL | 0 refills | Status: DC

## 2019-05-06 NOTE — Progress Notes (Signed)
Name: Melody Soto   MRN: [id number]    DOB: 01-07-1951   Date:05/06/2019       Progress Note  Subjective  Chief Complaint  Chief Complaint  Patient presents with  . Medication Refill    HPI   History of endometrial cancer:  had spotting in Feb, Dr. Baruch Gouty ordered a CT, discussed results including diverticulosis and cholelithiasis, including symptoms of acute cholecystitis and acute diverticulitis. She also had a pelvic US in Nov and keeps follow ups with gyn and radiation oncologist   HTN: states bp at home has been in the 130's/80's,she is off HCTZ , only on Micardis now and is not having any side effects, last bp at cardiologist was normal . No chest pain or palpitation   Obesity: she has gained 20 lbs since last visit back in June 2020. She states she does not have self control about food at this time.   Metabolic syndrome:last [id number] was 5.8%denies polyphagia, polyuria or polydipsia.We will recheck labs . Unchanged  Lymphedema: she responded well to PT ,she has been wearing compression stocking hoses, she only wears pumps occasionally   S/p TAVR: she had surgery done at [facility] on 02/27/2017 for severe aortic stenosis. Sheis on Metoprolol bidNo palpitation, chest pain or orthopnea. Reviewed last echo, she developed thrombus of aorta and is taking coumadin    Last Echo was 12/2018  INTERPRETATION ---------------------------------------------------------------   NORMAL LEFT VENTRICULAR SYSTOLIC FUNCTION WITH MILD LVH   NORMAL RIGHT VENTRICULAR SYSTOLIC FUNCTION   VALVULAR REGURGITATION: MILD AR, TRIVIAL MR, TRIVIAL PR   S/P TAVR WITH MILD-TO-MODERATE PARAVALVULAR REGURGITATION   ASCENDING AO FLOW TURBULENCE ( PEAK VEL. 2.7 M/S)  Depression: she took Celexa many years ago, she has a long history of depression ( worse when was separating from her husband),she is struggling more since COVID 19, being isolated. She states she is a people's person. She states she  struggles with normal challenges of life,but she has a great support system and does not want to take medication. She is suffering by not being able to see her first grandchild.   Urinary incontinence: she was seen by Urologist , tried medication but did not improve symptoms, so she stopped taking it, she is using a pad now. Unchanged    Patient Active Problem List   Diagnosis Date Noted  . Diverticulosis of colon 08/08/2018  . Cholelithiasis 08/08/2018  . Other neutropenia ([place]) 07/15/2017  . Endometrial cancer ([place]) 04/10/2017  . 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 Chronic acquired lymphedema 01/08/2017  . Obesity, morbid, BMI 40.0-49.9 ([place]) 11/13/2016  . FIGO stage II endometrial cancer ([place]) 10/29/2016  . Left ventricular hypertrophy 11/27/2014  . Lymphedema of lower extremity 08/23/2014  . Severe aortic stenosis 08/21/2014  . Benign essential HTN 08/21/2014  . Depression, major, recurrent, mild ([place]) 08/21/2014  . Dyslipidemia 08/21/2014  . Cardiac dysrhythmia 08/21/2014  . Dysmetabolic syndrome 0000000  . Symptomatic menopausal or female climacteric states 08/21/2014  . 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 History of shingles 08/21/2014  . Personal history of skin cancer 11/24/2009    Past Surgical History:  Procedure Laterality Date  . 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 cataract surgery Bilateral 05/24/2014   second eye 06/07/2014  . MELANOMA EXCISION    . OVARIAN CYST REMOVAL     EXPLORATORY LAPAROTOMY  . TONSILLECTOMY      Family History  Problem Relation Age of Onset  . Cancer Mother   . Stroke  Mother   . Thyroid nodules Mother   . Diabetes Sister   . Hypertension Sister   . Breast cancer Maternal Aunt   . Bone cancer Maternal Uncle   . Depression Daughter   . Depression Daughter     Social History   Tobacco Use  . Smoking status: Never Smoker  . Smokeless tobacco: Never Used  Substance  Use Topics  . Alcohol use: Yes    Alcohol/week: 0.0 standard drinks     Current Outpatient Medications:  .  atorvastatin (LIPITOR) 40 MG tablet, TAKE 1 TABLET (40 MG TOTAL) BY MOUTH 3 (THREE) TIMES A WEEK., Disp: 36 tablet, Rfl: 0 .  b complex vitamins tablet, Take 1 tablet by mouth daily., Disp: , Rfl:  .  Calcium Carb-Cholecalciferol (CALCIUM 500 + D3) 500-200 MG-UNIT TABS, Take 1 capsule by mouth 2 (two) times daily., Disp: , Rfl:  .  metoprolol tartrate (LOPRESSOR) 25 MG tablet, TAKE 1 TABLET BY MOUTH TWICE A DAY, Disp: 60 tablet, Rfl: 0 .  telmisartan (MICARDIS) 40 MG tablet, TAKE 1 TABLET BY MOUTH EVERY DAY, Disp: 90 tablet, Rfl: 0 .  traMADol (ULTRAM) 50 MG tablet, Take 1 tablet (50 mg total) by mouth every 6 (six) hours as needed., Disp: 15 tablet, Rfl: 0 .  vitamin B-12 (CYANOCOBALAMIN) 1000 MCG tablet, Take 1,000 mcg by mouth daily. , Disp: , Rfl:  .  warfarin (COUMADIN) 10 MG tablet, Take 10 mg by mouth daily., Disp: , Rfl:  .  MULTIPLE VITAMIN PO, Take 2 tablets by mouth 2 (two) times daily. Reported on 05/19/2015, Disp: , Rfl:   Allergies  Allergen Reactions  . Ace Inhibitors Other (See Comments)    UNKNOWN REACTION  . Duloxetine Other (See Comments)    Drowsiness    I personally reviewed active problem list, medication list, allergies, family history, social history, health maintenance with the patient/caregiver today.   ROS  Constitutional: Negative for fever or weight change.  Respiratory: Negative for cough and shortness of breath.   Cardiovascular: Negative for chest pain or palpitations.  Gastrointestinal: Negative for abdominal pain, no bowel changes.  Musculoskeletal: positive  for gait problem and knee  joint swelling.  Skin: Negative for rash.  Neurological: Negative for dizziness or headache.  No other specific complaints in a complete review of systems (except as listed in HPI above).  Objective  Vitals:   05/06/19 1418  BP: 140/80  Pulse: 93  Resp:  16  Temp: (!) 96.8 F (36 C)  TempSrc: Temporal  SpO2: 98%  Weight: 295 lb 14.4 oz (134.2 kg)  Height: 5' 8.25" (1.734 m)    Body mass index is 44.66 kg/m.  Physical Exam  Constitutional: Patient appears well-developed and well-nourished. Obese No distress.  HEENT: head atraumatic, normocephalic, pupils equal and reactive to light Cardiovascular: Normal rate, regular rhythm and normal heart sounds.  No murmur heard. lymphedema  BLE  Pulmonary/Chest: Effort normal and breath sounds normal. No respiratory distress. Abdominal: Soft.  There is no tenderness. Muscular skeletal: crepitus with extension of both knees  Psychiatric: Patient has a normal mood and affect. behavior is normal. Judgment and thought content normal.  PHQ2/9: Depression screen [facility] 2/9 05/06/2019 08/08/2018 08/05/2018 01/29/2018 07/15/2017  Decreased Interest 1 1 2  0 0  Down, Depressed, Hopeless 0 0 2 0 0  PHQ - 2 Score 1 1 4  0 0  Altered sleeping 0 0 0 0 0  Tired, decreased energy 1 1 1  0 0  Change in appetite  0 2 2 0 0  Feeling bad or failure about yourself  0 0 0 0 0  Trouble concentrating 0 1 0 0 1  Moving slowly or fidgety/restless 0 0 0 0 0  Suicidal thoughts 0 0 0 0 0  PHQ-9 Score 2 5 7  0 1  Difficult doing work/chores Not difficult at all Not difficult at all Somewhat difficult Not difficult at all Not difficult at all    phq 9 is positive   Fall Risk: Fall Risk  05/06/2019 08/08/2018 08/05/2018 01/29/2018 07/15/2017  Falls in the past year? 0 0 0 0 No  Number falls in past yr: 0 0 0 - -  Injury with Fall? 0 0 0 - -  Follow up - - Falls prevention discussed - -     Functional Status Survey: Is the patient deaf or have difficulty hearing?: No Does the patient have difficulty seeing, even when wearing glasses/contacts?: No Does the patient have difficulty concentrating, remembering, or making decisions?: No Does the patient have difficulty walking or climbing stairs?: No Does the patient have  difficulty dressing or bathing?: No Does the patient have difficulty doing errands alone such as visiting a doctor's office or shopping?: No    Assessment & Plan   1. Benign essential HTN  - metoprolol tartrate (LOPRESSOR) 25 MG tablet; Take 1 tablet (25 mg total) by mouth 2 (two) times daily.  Dispense: 60 tablet; Refill: 3 - telmisartan (MICARDIS) 40 MG tablet; Take 1 tablet (40 mg total) by mouth daily.  Dispense: 90 tablet; Refill: 0 - CBC with Differential/Platelet - COMPLETE METABOLIC PANEL WITH GFR  2. Obesity, morbid, BMI 40.0-49.9 ([place])  Discussed with the patient the risk posed by an increased BMI. Discussed importance of portion control, calorie counting and at least 150 minutes of physical activity weekly. Avoid sweet beverages and drink more water. Eat at least 6 servings of fruit and vegetables daily   3. Mild major depression (HCC)  Refuses medication  4. Dyslipidemia  - atorvastatin (LIPITOR) 40 MG tablet; Take 1 tablet (40 mg total) by mouth 3 (three) times a week.  Dispense: 36 tablet; Refill: 0  5. S/P TAVR (transcatheter aortic valve replacement)  Keep follow up with cardiovascular surgeon   6. Chronic acquired lymphedema   7. Dysmetabolic syndrome   8. Thrombus of aorta ([place])  Needs to contact her cardiologist and get INR checked   9. Mild concentric left ventricular hypertrophy (LVH)   10. Abnormal red blood cells  - CBC with Differential/Platelet - Vitamin B12  11. Hyperglycemia  - Hemoglobin A1c  12. History of endometrial cancer  Keep follow up with radiation oncologist, gyn-oncologist , surgeon at [facility]

## 2019-05-07 LAB — CBC WITH DIFFERENTIAL/PLATELET
Absolute Monocytes: 586 cells/uL (ref 200–950)
Basophils Absolute: 50 cells/uL (ref 0–200)
Basophils Relative: 0.8 %
Eosinophils Absolute: 107 cells/uL (ref 15–500)
Eosinophils Relative: 1.7 %
HCT: 43.2 % (ref 35.0–45.0)
Hemoglobin: 14.3 g/dL (ref 11.7–15.5)
Lymphs Abs: 1405 cells/uL (ref 850–3900)
MCH: 32.6 pg (ref 27.0–33.0)
MCHC: 33.1 g/dL (ref 32.0–36.0)
MCV: 98.4 fL (ref 80.0–100.0)
MPV: 11 fL (ref 7.5–12.5)
Monocytes Relative: 9.3 %
Neutro Abs: 4152 cells/uL (ref 1500–7800)
Neutrophils Relative %: 65.9 %
Platelets: 257 10*3/uL (ref 140–400)
RBC: 4.39 10*6/uL (ref 3.80–5.10)
RDW: 13 % (ref 11.0–15.0)
Total Lymphocyte: 22.3 %
WBC: 6.3 10*3/uL (ref 3.8–10.8)

## 2019-05-07 LAB — COMPLETE METABOLIC PANEL WITH GFR
AG Ratio: 1.7 (calc) (ref 1.0–2.5)
ALT: 24 U/L (ref 6–29)
AST: 20 U/L (ref 10–35)
Albumin: 4.3 g/dL (ref 3.6–5.1)
Alkaline phosphatase (APISO): 82 U/L (ref 37–153)
BUN: 22 mg/dL (ref 7–25)
CO2: 26 mmol/L (ref 20–32)
Calcium: 9.9 mg/dL (ref 8.6–10.4)
Chloride: 103 mmol/L (ref 98–110)
Creat: 0.84 mg/dL (ref 0.50–0.99)
GFR, Est African American: 83 mL/min/{1.73_m2} (ref >=60)
GFR, Est Non African American: 72 mL/min/{1.73_m2} (ref >=60)
Globulin: 2.5 g/dL (calc) (ref 1.9–3.7)
Glucose, Bld: 125 mg/dL — ABNORMAL HIGH (ref 65–99)
Potassium: 4.4 mmol/L (ref 3.5–5.3)
Sodium: 139 mmol/L (ref 135–146)
Total Bilirubin: 0.6 mg/dL (ref 0.2–1.2)
Total Protein: 6.8 g/dL (ref 6.1–8.1)

## 2019-05-07 LAB — HEMOGLOBIN A1C
Hgb A1c MFr Bld: 6.1 % of total Hgb — ABNORMAL HIGH (ref <5.7)
Mean Plasma Glucose: 128 (calc)
eAG (mmol/L): 7.1 (calc)

## 2019-05-07 LAB — VITAMIN B12: Vitamin B-12: 511 pg/mL (ref 200–1100)

## 2019-05-13 ENCOUNTER — Encounter: Payer: Self-pay | Admitting: Family Medicine

## 2019-06-13 ENCOUNTER — Other Ambulatory Visit: Payer: Self-pay | Admitting: Family Medicine

## 2019-06-13 DIAGNOSIS — I10 Essential (primary) hypertension: Secondary | ICD-10-CM

## 2019-07-02 ENCOUNTER — Telehealth: Payer: Self-pay | Admitting: Family Medicine

## 2019-07-02 NOTE — Chronic Care Management (AMB) (Signed)
  Chronic Care Management   Note  07/02/2019 Name: Melody Soto MRN: [id number] DOB: [birth year]  Melody Soto is a 69 y.o. year old female who is a primary care patient of Steele Sizer, MD. I reached out to [name] by phone today in response to a referral sent by Melody Soto's health plan.     Melody Soto was given information about Chronic Care Management services today including:  1. CCM service includes personalized support from designated clinical staff supervised by her physician, including individualized plan of care and coordination with other care providers 2. 24/7 contact phone numbers for assistance for urgent and routine care needs. 3. Service will only be billed when office clinical staff spend 20 minutes or more in a month to coordinate care. 4. Only one practitioner may furnish and bill the service in a calendar month. 5. The patient may stop CCM services at any time (effective at the end of the month) by phone call to the office staff. 6. The patient will be responsible for cost sharing (co-pay) of up to 20% of the service fee (after annual deductible is met).  Patient agreed to services and verbal consent obtained.   Follow up plan: Telephone appointment with care management team member scheduled for:07/31/2019  Noreene Larsson, [place], [place], [place] [address] Direct Dial: [phone] [email or web address]: [email or web address]

## 2019-07-08 ENCOUNTER — Ambulatory Visit: Payer: Medicare HMO

## 2019-07-29 ENCOUNTER — Other Ambulatory Visit: Payer: Self-pay

## 2019-07-29 ENCOUNTER — Inpatient Hospital Stay: Payer: Medicare HMO | Attending: Obstetrics and Gynecology | Admitting: Obstetrics and Gynecology

## 2019-07-29 VITALS — BP 130/69 | HR 72 | Temp 98.1°F | Resp 16 | Wt 281.0 lb

## 2019-07-29 DIAGNOSIS — Z6841 Body Mass Index (BMI) 40.0 and over, adult: Secondary | ICD-10-CM | POA: Insufficient documentation

## 2019-07-29 DIAGNOSIS — I517 Cardiomegaly: Secondary | ICD-10-CM | POA: Insufficient documentation

## 2019-07-29 DIAGNOSIS — Z923 Personal history of irradiation: Secondary | ICD-10-CM | POA: Insufficient documentation

## 2019-07-29 DIAGNOSIS — I350 Nonrheumatic aortic (valve) stenosis: Secondary | ICD-10-CM | POA: Diagnosis not present

## 2019-07-29 DIAGNOSIS — R918 Other nonspecific abnormal finding of lung field: Secondary | ICD-10-CM

## 2019-07-29 DIAGNOSIS — Z8542 Personal history of malignant neoplasm of other parts of uterus: Secondary | ICD-10-CM | POA: Insufficient documentation

## 2019-07-29 DIAGNOSIS — E785 Hyperlipidemia, unspecified: Secondary | ICD-10-CM | POA: Insufficient documentation

## 2019-07-29 DIAGNOSIS — I890 Lymphedema, not elsewhere classified: Secondary | ICD-10-CM | POA: Diagnosis not present

## 2019-07-29 DIAGNOSIS — R911 Solitary pulmonary nodule: Secondary | ICD-10-CM | POA: Insufficient documentation

## 2019-07-29 DIAGNOSIS — Z79899 Other long term (current) drug therapy: Secondary | ICD-10-CM | POA: Insufficient documentation

## 2019-07-29 DIAGNOSIS — I10 Essential (primary) hypertension: Secondary | ICD-10-CM

## 2019-07-29 DIAGNOSIS — Z08 Encounter for follow-up examination after completed treatment for malignant neoplasm: Secondary | ICD-10-CM | POA: Diagnosis not present

## 2019-07-29 DIAGNOSIS — C541 Malignant neoplasm of endometrium: Secondary | ICD-10-CM

## 2019-07-29 MED ORDER — METOPROLOL TARTRATE 25 MG PO TABS: 25.0000 mg | ORAL_TABLET | Freq: Two times a day (BID) | ORAL | 5 refills | Status: DC

## 2019-07-29 NOTE — Progress Notes (Signed)
Melody Soto  Telephone:([phone] [phone]  Patient Care Team: Steele Sizer, MD as PCP - General (Family Medicine) Will Bonnet, MD as Attending Physician (Obstetrics and Gynecology) Noreene Filbert, MD as Referring Physician (Radiation Oncology) Phylis Bougie, MD as Referring Physician (Cardiothoracic Surgery) Shirline Frees, MD as Referring Physician (Radiation Oncology) Clent Jacks, RN as Registered Nurse Neldon Labella, RN as Registered Nurse    Name of the patient: Melody Soto  [id number]  [birth year]   Date of visit: 07/29/2019  Gynecologic Oncology Interval Visit   Referring Provider: Will Bonnet,  MD  Chief Concern: Grade 1 endometrial cancer  Subjective:  Melody Soto is a 69 y.o. female G2P2, initially seen in consultation for Dr. 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 She is s/p prior XL ovarian cystectomy for benign disease. She returns for follow up. Follow up US completed 12/18/2018.   She presents to the clinic today for surveillance. She was last seen in our clinic 12/10/2018. Her EMS had decreased in size and therefore biopsy not performed. She has no gynecologic complaints. She requests PT referral for lymphedema.    [place] PEL 12/18/2018 Uterus: Measurements: 8.2 x 3.3 x 4.8 cm = volume: 67.7 mL. 1.3 x 0.9 x 1.4 cm subserosal fibroid again noted at the right lower uterine segment. Endometrium:Thickness: 5.4 mm. No focal abnormality visualized. No significantly increased vascularity. Right ovary: Not visualized.  No adnexal mass. Left ovary: Not visualized.  No adnexal mass. Other findingsNo abnormal free fluid.  IMPRESSION: 1. Endometrial complex measures 5.4 mm in thickness without focal abnormality. Continued ultrasound surveillance of treated endometrial carcinoma recommended as clinically warranted. 2. 1.4 cm fibroid at  the right lower uterine segment, stable.    Gynecologic Oncology History Melody Soto is a pleasant female G2P2 s/p prior XL ovarian cystectomy for benign disease who is seen in consultation from Dr. Glennon Mac for grade 1 endometrial cancer.  10/17/2016 Endometrial biopsy and biopsy of cervical polyp  Part A: ENDOMETRIUM, BIOPSY:  WELL DIFFERENTIATED ENDOMETRIAL ADENOCARCINOMA (FIGO I). COMMENT: THIS CASE WAS REVIEWED IN INTRADEPARTMENTAL CONSULTATION AND THE DIAGNOSIS REFLECTS OUR CONSENSUS OPINION.DR,JACKSON'S OFFICE WAS INFORMED OF DIAGNOSIS ON 10/19/16 AT 1.10 PM.  Part B: ENDOCERVIX, POLYP:  MINUTE FRAGMENTS OFOF ADENOCARCINOMA PROBABLY FROM ENDOMETRIUM.  MINUTE FRAGMENTS OF BENIGN ENDOCERVICAL GLANDS, MUCUS, AND BLOOD.  Pap unsatisfactory   10/29/2017 Pelvic ultrasound Uterus 7.3 x 5 x 5 cm with small leiomyomas Bilateral ovaries normal. Endometrial stripe 7.1 mm   CT C/A/P 11/20/2016 IMPRESSION: 1. There is a mildly enlarged right hilar lymph node at 1.1 cm, neck given the that there are no other indicators of metastatic disease, this is likely incidental. No worrisome pulmonary nodules identified. 2. The endometrium is indistinct. Uterine and adnexal contours relatively normal. 3. Asymmetric glandular tissues in the right upper breast compared to the left. Asymmetry is often normal, but mammographic correlation is suggested. 4. Other imaging findings of potential clinical significance: Aortic Atherosclerosis (ICD10-I70.0). Calcified aortic valve with faint calcification of the mitral valve. Cholelithiasis. Small type 1 hiatal hernia. Descending colon diverticulosis.  She was not a candidate for surgical management due to cardiac disease. She underwent primary radiation.   She underwent WPRT (45 Gy over 5 weeks) with Dr. Baruch Gouty at [facility] followed by vaginal cuff brachytherapy with Dr. Christel Mormon at [facility]. She completed high-dose rate Brachytherapy to the cervix (treatment 5 of planned 5)  on 04/02/2017.  04/16/3017 Mammogram  negative   05/09/3017 CT chest wo contrast IMPRESSION: 1. Stable small scattered mediastinal and hilar lymph nodes. No mass or adenopathy. No new or progressive findings. 2. Stable small pulmonary nodules. Recommend follow-up noncontrast chest CT in 1 year. 3. Stable mild emphysematous changes.  07/31/3017 CT Abdomen Pelvis W Contrast IMPRESSION: Negative. No evidence of recurrent or metastatic carcinoma within the abdomen or pelvis. Cholelithiasis.  No radiographic evidence of cholecystitis. Mild hepatic steatosis. Colonic diverticulosis, without radiographic evidence of diverticulitis.  03/2018 she had noticed some vaginal spotting and evaluation included CT scan, [place], and EMBX.   04/11/2018 CT C/A/P IMPRESSION: 1. Stable exam. No new or progressive finding since prior chest CT and abdomen/pelvis CT. 2. Multiple bilateral pulmonary nodules measuring 5 mm less, stable in the 1 year interval since 05/09/2017 suggesting benign etiology. Continued attention on follow-up recommended 3. Cholelithiasis. 4. Colonic diverticulosis without diverticulitis.  04/21/2018 Pelvic US Uterus: Measurements: 5.9 x 4.3 x 5.3 cm = volume: 69.5 mL. 11 x 10 x 18 mm subserosal fibroid in the anterior lower uterine segment. Endometrium Thickness: 8 mm.  No focal abnormality visualized. Bilateral ovaries: No adnexal mass is seen.  04/23/2018   A. ENDOMETRIUM; BIOPSY:  - EXTREMELY SCANT BENIGN SQUAMOUS AND ENDOCERVICAL EPITHELIUM.  - NO ENDOMETRIAL TISSUE IS PRESENT.  We recommended repeat ultrasound and if EMS stable or increase we would repeat EMBx since the biopsy was not adequate for evaluation of the endometrial cavity.     06/17/2018 Pelvic Ultrasound FINDINGS: Uterus: Measurements: 6.3 x 3.8 x 5.0 cm = volume: 62.2 mL. 1.2 x 0.9 x 1.3 cm exophytic fibroid present at the level of the lower uterine segment.  Endometrium Thickness: 7.3 mm. No focal abnormality visualized. No  increased vascularity.  Bilateral ovaries: Not visualized.  No adnexal mass.  Trace free fluid within the pelvis.  IMPRESSION: 1. Endometrial complex measures 7.3 mm in thickness without focalabnormality. 2. 1.3 cm fibroid arising from the lower uterine segment, stable.       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   Chronic acquired lymphedema 01/08/2017   Obesity, morbid, BMI 40.0-49.9 ([place]) 11/13/2016   FIGO stage II endometrial cancer ([place]) 10/29/2016   Left ventricular hypertrophy 11/27/2014   Lymphedema of lower extremity 08/23/2014   Severe aortic stenosis 08/21/2014   Benign essential HTN 08/21/2014   Depression, major, recurrent, mild ([place]) 08/21/2014   Dyslipidemia 08/21/2014   Cardiac dysrhythmia 83/25/4982   Dysmetabolic syndrome 64/15/8309   Symptomatic menopausal or female climacteric states 08/21/2014   Female genuine stress incontinence 08/21/2014   History of shingles 08/21/2014   Personal history of skin cancer 11/24/2009    Past Medical History: Past Medical History:  Diagnosis Date   Aortic stenosis    Cataract    Depression    Endometrial cancer determined by uterine biopsy ([place]) 10/29/2016   Rad tx's and internal brachytherapy.   Heart murmur    HX OF   Hyperlipidemia    Hypertension    CONTROLLED ON MEDS   Lymphedema    Shortness of breath dyspnea     Past Surgical History: Past Surgical History:  Procedure Laterality Date   AORTIC VALVE REPLACEMENT Bilateral 02/27/2017   cataract surgery Bilateral 05/24/2014   second eye 06/07/2014   MELANOMA EXCISION  OVARIAN CYST REMOVAL     EXPLORATORY LAPAROTOMY   TONSILLECTOMY      Past Gynecologic History:  Last  Menstrual Period:  2016; postmenopausal Last pap: 2018   OB History:  OB History  Gravida Para Term Preterm AB Living  2 2       2   SAB TAB Ectopic Multiple Live Births               # Outcome Date GA Lbr Len/2nd Weight Sex Delivery Anes PTL Lv  2 Para           1 Para             Obstetric Comments  7 pregancies; 2 NSVDs    Family History: Family History  Problem Relation Age of Onset   Cancer Mother    Stroke Mother    Thyroid nodules Mother    Diabetes Sister    Hypertension Sister    Breast cancer Maternal Aunt    Bone cancer Maternal Uncle    Depression Daughter    Depression Daughter     Social History: Social History   Socioeconomic History   Marital status: Divorced    Spouse name: Not on file   Number of children: 2   Years of education: Not on file   Highest education level: Bachelor's degree (e.g., BA, AB, BS)  Occupational History   Not on file  Tobacco Use   Smoking status: Never Smoker   Smokeless tobacco: Never Used  Vaping Use   Vaping Use: Never used  Substance and Sexual Activity   Alcohol use: Yes    Alcohol/week: 0.0 standard drinks   Drug use: No   Sexual activity: Not Currently    Birth control/protection: Post-menopausal  Other Topics Concern   Not on file  Social History Narrative   She is divorced   She has two daughters   The youngest is not currently talking to her since 07/2018. She got pregnant without being married and had the baby in October.    Social Determinants of Health   Financial Resource Strain: Medium Risk   Difficulty of Paying Living Expenses: Somewhat hard  Food Insecurity:    Worried About Charity fundraiser in the Last Year:    Arboriculturist in the Last Year:   Transportation Needs:    Film/video editor (Medical):    Lack of Transportation (Non-Medical):   Physical Activity:    Days of Exercise per Week:    Minutes of Exercise per Session:   Stress:    Feeling  of Stress :   Social Connections:    Frequency of Communication with Friends and Family:    Frequency of Social Gatherings with Friends and Family:    Attends Religious Services:    Active Member of Clubs or Organizations:    Attends Archivist Meetings:    Marital Status:   Intimate Partner Violence:    Fear of Current or Ex-Partner:    Emotionally Abused:    Physically Abused:    Sexually Abused:     Allergies: Allergies  Allergen Reactions   Ace Inhibitors Other (See Comments)    UNKNOWN REACTION   Duloxetine Other (See Comments)    Drowsiness    Current Medications: Current Outpatient Medications  Medication Sig Dispense Refill   atorvastatin (LIPITOR) 40 MG tablet Take 1 tablet (40 mg total) by mouth 3 (three) times a week. 36 tablet 0   b complex vitamins  tablet Take 1 tablet by mouth daily.     Calcium Carb-Cholecalciferol (CALCIUM 500 + D3) 500-200 MG-UNIT TABS Take 1 capsule by mouth 2 (two) times daily.     metoprolol tartrate (LOPRESSOR) 25 MG tablet Take 1 tablet (25 mg total) by mouth 2 (two) times daily. 60 tablet 3   MULTIPLE VITAMIN PO Take 2 tablets by mouth 2 (two) times daily. Reported on 05/19/2015     telmisartan (MICARDIS) 40 MG tablet TAKE 1 TABLET BY MOUTH EVERY DAY 90 tablet 1   vitamin B-12 (CYANOCOBALAMIN) 1000 MCG tablet Take 1,000 mcg by mouth daily.      warfarin (COUMADIN) 10 MG tablet Take 10 mg by mouth daily.     No current facility-administered medications for this visit.   Review of Systems General: no complaints  HEENT: no complaints  Lungs: no complaints  Cardiac: no complaints  GI: no complaints  GU: no complaints  Musculoskeletal: no complaints  Extremities: no complaints  Skin: no complaints  Neuro: no complaints  Endocrine: no complaints  Psych: no complaints        Objective:  Physical Examination:   BP 130/69    Pulse 72    Temp 98.1 F (36.7 C) (Oral)    Resp 16    Wt 127.5 kg    BMI  42.41 kg/m    ECOG Performance Status: 1 - Symptomatic but completely ambulatory   GENERAL: Patient is a well appearing female in no acute distress HEENT:  PERRL, neck supple with midline trachea. Thyroid without masses.  NODES:  No cervical, supraclavicular, axillary, or inguinal lymphadenopathy palpated.  LUNGS:  Clear to auscultation bilaterally.  No wheezes or rhonchi. HEART:  Regular rate and rhythm. No murmur appreciated. ABDOMEN:  Soft, nontender.  Positive, normoactive bowel sounds.  MSK:  No focal spinal tenderness to palpation. Full range of motion bilaterally in the upper extremities. EXTREMITIES:  Severe peripheral edema with compression wraps in place.    SKIN:  Clear with no obvious rashes or skin changes. No nail dyscrasia. NEURO:  Nonfocal. Well oriented.  Appropriate affect.   Pelvic: EGBUS: no lesions Cervix: no lesions, nontender, mobile Vagina: no lesions, no discharge or bleeding Uterus: unable to determine size, but nontender Adnexa: no palpable masses Rectovaginal: deferred      Assessment:  JOLEY UTECHT is a 69 y.o. female diagnosed with possible stage II grade 1 endometrial cancer with involvement of a cervical polyp s/p primary radiation therapy. NED on exam  Prior history of spotting - Imaging unremarkable other than thickened EMS. EMBx obtained with scant tissue, repeat imaging decreased endometrial stripe 06/2018 and no further bleeding episodes. Repeat US decreased EMS and no further symptoms.   Stable small pulmonary nodules on imaging - repeat CT chest 03/2018 stable.   Enlarged hilar node - no evidence of progression on imaging.   Asymmetric breast tissue - negative mammogram, recommended she follow up with her screening mammogram.   S/P TAVR (transcatheter aortic valve replacement)   Medical co-morbidities complicating care: left ventricular hypertrophy, severe aortic stenosis, cardiac dysrhythmia, hyperlipidemia,, HTN and obesity.     Plan:   Endometrial cancer ([place]) - Plan: Ambulatory referral to Physical Therapy  Lymphedema of lower extremity, unspecified laterality - Plan: Ambulatory referral to Physical Therapy  Solitary pulmonary nodule    She will also alternate visit with Dr. Baruch Gouty and we will begin to alternate with Dr. Glennon Mac. Dr. Glennon Mac in 6 months and again in our clinic again in 1 year.  With regard to pulmonary nodules repeat Chest CT.   We discussed weight loss and exercise. She is following a keto diet and we reviewed the option of doing chair exercises.   I personally had a face to face interaction and evaluated the patient jointly with the NP student, Benedetto Goad.  I have reviewed her history and available records and have performed the key portions of the physical exam including inguinal lymph node survey, abdominal exam, extremity, pelvic exam with my findings confirming those documented above by the APP.  I have discussed the case with the APP and the patient.  I agree with the above documentation, assessment and plan which was fully formulated by me.  Counseling was completed by me.   Abimael Zeiter Gaetana Michaelis, MD

## 2019-07-29 NOTE — Patient Instructions (Signed)
Please schedule an appointment with Dr. Glennon Mac in 6 months, and see [place] again in 1 year.    If you have any spotting, please call our clinic immediately!   Good to see you!

## 2019-07-29 NOTE — Progress Notes (Signed)
Pt has no gyn concerns. She stil has her lymphedema for over 2 years and she wears compression sleeves.

## 2019-07-30 ENCOUNTER — Telehealth: Payer: Self-pay

## 2019-07-30 MED ORDER — NYSTATIN 100000 UNIT/GM EX POWD: 1.0000 "application " | Freq: Three times a day (TID) | CUTANEOUS | 0 refills | Status: DC

## 2019-07-30 NOTE — Telephone Encounter (Signed)
Voicemail left with appointment details for chest CT. Asked for call back to ensure she received.

## 2019-07-30 NOTE — Addendum Note (Signed)
Addended by: Mariea Clonts D on: 07/30/2019 10:44 AM   Modules accepted: Orders

## 2019-07-30 NOTE — Telephone Encounter (Signed)
Call placed to Melody Soto to ensure she received Dr. Gershon Crane message regarding need to have repeat chest CT for her lung nodules. I have also sent prescription for Nystatin powder to her pharmacy. Voicemail left.

## 2019-07-31 ENCOUNTER — Ambulatory Visit (INDEPENDENT_AMBULATORY_CARE_PROVIDER_SITE_OTHER): Payer: Medicare HMO

## 2019-07-31 DIAGNOSIS — Z5181 Encounter for therapeutic drug level monitoring: Secondary | ICD-10-CM | POA: Diagnosis not present

## 2019-07-31 DIAGNOSIS — I10 Essential (primary) hypertension: Secondary | ICD-10-CM | POA: Diagnosis not present

## 2019-07-31 DIAGNOSIS — Z952 Presence of prosthetic heart valve: Secondary | ICD-10-CM | POA: Diagnosis not present

## 2019-07-31 DIAGNOSIS — E785 Hyperlipidemia, unspecified: Secondary | ICD-10-CM

## 2019-07-31 DIAGNOSIS — Z7901 Long term (current) use of anticoagulants: Secondary | ICD-10-CM | POA: Diagnosis not present

## 2019-07-31 DIAGNOSIS — I890 Lymphedema, not elsewhere classified: Secondary | ICD-10-CM

## 2019-07-31 NOTE — Chronic Care Management (AMB) (Signed)
Chronic Care Management   Initial Visit Note  07/31/2019 Name: Melody Soto MRN: [id number] DOB: [birth year]  Primary Care Provider: Steele Sizer, MD Reason for referral : Chronic Care Management   Melody Soto is a 69 y.o. year old female who is a primary care patient of Steele Sizer, MD. The CCM team was consulted for assistance with chronic disease management and care coordination needs. A telephonic assessment was conducted today.  Review of Melody Soto's status, including review of consultants reports, relevant labs and test results was conducted today. Collaboration with appropriate care team members was performed as part of the comprehensive evaluation and provision of chronic care management services.    SDOH (Social Determinants of Health) assessments performed: Yes No interventions required at this time.    Medications: Outpatient Encounter Medications as of 07/31/2019  Medication Sig Note  . ascorbic Acid (VITAMIN C) 500 MG CPCR Take 500 mg by mouth daily.   Marland Kitchen atorvastatin (LIPITOR) 40 MG tablet Take 1 tablet (40 mg total) by mouth 3 (three) times a week.   . Cholecalciferol (VITAMIN D-3) 125 MCG (5000 UT) TABS Take 3 tablets by mouth daily.   . Magnesium 500 MG CAPS Take by mouth.   . metoprolol tartrate (LOPRESSOR) 25 MG tablet Take 1 tablet (25 mg total) by mouth 2 (two) times daily.   Marland Kitchen telmisartan (MICARDIS) 40 MG tablet TAKE 1 TABLET BY MOUTH EVERY DAY   . warfarin (COUMADIN) 10 MG tablet Take 20 mg by mouth daily.  07/31/2019: Dose change pending  . nystatin (NYSTATIN) powder Apply 1 application topically 3 (three) times daily.   . vitamin B-12 (CYANOCOBALAMIN) 1000 MCG tablet Take 1,000 mcg by mouth daily.     No facility-administered encounter medications on file as of 07/31/2019.      Goals Addressed            This Visit's Progress   . Chronic Disease Management       CARE PLAN ENTRY (see longitudinal plan of care for additional care plan  information)  Current Barriers:  . Chronic Disease Management support and education needs related to HTN, Dyslipidemia and Chronic Acquired Lymphedema. (Hx of Endometrial Cancer)  Case Manager Clinical Goal(s):  Over the next 120 days, patient will: . Continue to take all medications as prescribed. . Attend all medical appointments as scheduled. . Monitor blood pressure and record readings. . Continue to monitor weight and nutritional intake. . Continue to follow recommended safety measures to prevent falls and injuries.   Interventions:  . Inter-disciplinary care team collaboration (see longitudinal plan of care) . Reviewed medications. Encouraged to continue taking all medications as prescribed. Encouraged to notify provider if unable to tolerate prescribed regimen. Encouraged to notify care management team with concerns regarding medication costs.  . Provided information regarding established blood pressure parameters and indications for notifying a provider. Encouraged to monitor a few times a week if unable to monitor daily and record readings. Reports not monitoring often but recalls last reading of 130/74. Marland Kitchen Discussed current plan regarding lymphedema management. Discussed worsening s/sx that require medical intervention. Encouraged to continue monitoring lower extremities and wearing compression stockings as recommended.  . Discussed nutritional intake, heart healthy options and current weight loss goals. Doing very well with monitoring dietary intake, tracking weight and  maintaining a food journal.  Plans to engage in mild activity as tolerated. Very motivated to achieve weight loss goals and improve overall health. Reports current weight of 275 lbs. . Reviewed  scheduled/pending appointments. Encouraged to attend medical appointments as scheduled to prevent delays in care. Encouraged to notify care management team with concerns regarding transportation.  . Discussed plans for ongoing  care management and follow-up. Provided direct contact information for care management team.  Patient Self Care Activities:  . Self administers medications  . Attends scheduled provider appointments . Calls pharmacy for medication refills . Performs ADL's independently . Performs IADL's independently . Calls provider office for new concerns or questions   Initial goal documentation        Melody Soto was given information about Chronic Care Management services including:  1. CCM service includes personalized support from designated clinical staff supervised by her physician, including individualized plan of care and coordination with other care providers 2. 24/7 contact phone numbers for assistance for urgent and routine care needs. 3. Service will only be billed when office clinical staff spend 20 minutes or more in a month to coordinate care. 4. Only one practitioner may furnish and bill the service in a calendar month. 5. The patient may stop CCM services at any time (effective at the end of the month) by phone call to the office staff. 6. The patient will be responsible for cost sharing (co-pay) of up to 20% of the service fee (after annual deductible is met).  Patient agreed to services and verbal consent obtained.    PLAN The care management team will follow-up with Melody Soto within the next two months.   [facility] Care Management [phone]

## 2019-08-10 ENCOUNTER — Other Ambulatory Visit: Payer: Self-pay

## 2019-08-10 ENCOUNTER — Ambulatory Visit
Admission: RE | Admit: 2019-08-10 | Discharge: 2019-08-10 | Disposition: A | Discharge status: Home / Self Care | Payer: Medicare HMO | Source: Ambulatory Visit | Attending: Obstetrics and Gynecology | Admitting: Obstetrics and Gynecology

## 2019-08-10 DIAGNOSIS — R918 Other nonspecific abnormal finding of lung field: Secondary | ICD-10-CM | POA: Diagnosis not present

## 2019-08-10 DIAGNOSIS — C541 Malignant neoplasm of endometrium: Secondary | ICD-10-CM | POA: Diagnosis not present

## 2019-08-11 ENCOUNTER — Ambulatory Visit (INDEPENDENT_AMBULATORY_CARE_PROVIDER_SITE_OTHER): Payer: Medicare HMO

## 2019-08-11 VITALS — BP 122/74 | HR 68 | Temp 97.3°F | Resp 16 | Ht 68.0 in | Wt 277.1 lb

## 2019-08-11 DIAGNOSIS — Z1231 Encounter for screening mammogram for malignant neoplasm of breast: Secondary | ICD-10-CM | POA: Diagnosis not present

## 2019-08-11 DIAGNOSIS — Z0000 Encounter for general adult medical examination without abnormal findings: Secondary | ICD-10-CM | POA: Diagnosis not present

## 2019-08-11 NOTE — Progress Notes (Addendum)
Subjective:   Melody Soto is a 69 y.o. female who presents for Medicare Annual (Subsequent) preventive examination.  Review of Systems     Cardiac Risk Factors include: advanced age (>37mn, >>85women);obesity (BMI >30kg/m2);hypertension;dyslipidemia;sedentary lifestyle     Objective:    Today's Vitals   08/11/19 1341  BP: 122/74  Pulse: 68  Resp: 16  Temp: (!) 97.3 F (36.3 C)  TempSrc: Temporal  SpO2: 96%  Weight: 277 lb 1.6 oz (125.7 kg)  Height: 5' 8"  (1.727 m)  PainSc: 2    Body mass index is 42.13 kg/m.  Advanced Directives 07/29/2019 05/01/2019 12/10/2018 11/19/2018 11/18/2018 08/13/2018 08/05/2018  Does Patient Have a Medical Advance Directive? Yes No No Yes;No No No No  Type of [place]ive [place] Does patient want to make changes to medical advance directive? - - - - - - -  [place] of [place]? No - copy requested - - - - - -  Would patient like information on creating a medical advance directive? - No - Patient declined - No - Patient declined No - Patient declined No - Patient declined Yes (MAU/Ambulatory/Procedural Areas - Information given)    Current Medications (verified) Outpatient Encounter Medications as of 08/11/2019  Medication Sig  . ascorbic Acid (VITAMIN C) 500 MG CPCR Take 500 mg by mouth daily.  .Marland Kitchenatorvastatin (LIPITOR) 40 MG tablet Take 1 tablet (40 mg total) by mouth 3 (three) times a week.  . Cholecalciferol (VITAMIN D-3) 125 MCG (5000 UT) TABS Take 3 tablets by mouth daily.  . Magnesium 500 MG CAPS Take by mouth.  . metoprolol tartrate (LOPRESSOR) 25 MG tablet Take 1 tablet (25 mg total) by mouth 2 (two) times daily.  .Marland Kitchennystatin (NYSTATIN) powder Apply 1 application topically 3 (three) times daily.  .Marland Kitchentelmisartan (MICARDIS) 40 MG tablet TAKE 1 TABLET BY MOUTH EVERY DAY  . warfarin (COUMADIN) 5 MG tablet Take 2 tablets by mouth daily. Pt takes 2 tabs (10 mg) daily and 3 tabs  (15 mg) on Wednesdays  . [DISCONTINUED] vitamin B-12 (CYANOCOBALAMIN) 1000 MCG tablet Take 1,000 mcg by mouth daily.   . [DISCONTINUED] warfarin (COUMADIN) 10 MG tablet Take 20 mg by mouth daily.    No facility-administered encounter medications on file as of 08/11/2019.    Allergies (verified) Ace inhibitors and Duloxetine   History: Past Medical History:  Diagnosis Date  . Aortic stenosis   . Cataract   . Depression   . Endometrial [place]   Rad tx's and internal brachytherapy.  .Marland KitchenHeart murmur    HX OF  . Hyperlipidemia   . Hypertension    CONTROLLED ON MEDS  . Lymphedema   . Shortness of breath dyspnea    Past Surgical History:  Procedure Laterality Date  . 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 cataract surgery Bilateral 05/24/2014   second eye 06/07/2014  . MELANOMA EXCISION    . OVARIAN CYST REMOVAL     EXPLORATORY LAPAROTOMY  . TONSILLECTOMY     Family History  Problem Relation Age of Onset  . Cancer Mother   . Stroke Mother   . Thyroid nodules Mother   . Diabetes Sister   . Hypertension Sister   . Breast cancer Maternal Aunt   . Bone cancer Maternal Uncle   . Depression Daughter   . Depression Daughter    Social History  Socioeconomic History  . Marital status: Divorced    Spouse name: Not on file  . Number of children: 2  . Years of education: Not on file  . Highest education level: Bachelor's degree (e.g., BA, AB, BS)  Occupational History  . Occupation: caregiver    Employer: [employer]    Comment: caregiver  Tobacco Use  . Smoking status: Never Smoker  . Smokeless tobacco: Never Used  Vaping Use  . Vaping Use: Never used  Substance and Sexual Activity  . Alcohol use: Not Currently    Alcohol/week: 0.0 standard drinks  . Drug use: No  . Sexual activity: Not Currently    Birth control/protection: Post-menopausal  Other Topics Concern  . Not on file  Social History  Narrative   She is divorced   She has two daughters   The youngest is not currently talking to her since 07/2018. She got pregnant without being married and had the baby in October.    Social Determinants of Health   Financial Resource Strain: Medium Risk  . Difficulty of Paying Living Expenses: Somewhat hard  Food Insecurity: No Food Insecurity  . Worried About Charity fundraiser in the Last Year: Never true  . Ran Out of Food in the Last Year: Never true  Transportation Needs: No Transportation Needs  . Lack of Transportation (Medical): No  . Lack of Transportation (Non-Medical): No  Physical Activity: Inactive  . Days of Exercise per Week: 0 days  . Minutes of Exercise per Session: 0 min  Stress: No Stress Concern Present  . Feeling of Stress : Only a little  Social Connections: Moderately Integrated  . Frequency of Communication with Friends and Family: More than three times a week  . Frequency of Social Gatherings with Friends and Family: Twice a week  . Attends Religious Services: More than 4 times per year  . Active Member of Clubs or Organizations: Yes  . Attends Archivist Meetings: More than 4 times per year  . Marital Status: Divorced    Tobacco Counseling Counseling given: Not Answered   Clinical Intake:  Pre-visit preparation completed: Yes  Pain : 0-10 Pain Score: 2  Pain Type: Chronic pain Pain Location: Hip Pain Orientation: Left Pain Descriptors / Indicators: Aching Pain Onset: More than a month ago Pain Frequency: Constant     BMI - recorded: 42.13 Nutritional Status: BMI > 30  Obese Nutritional Risks: None Diabetes: No  How often do you need to have someone help you when you read instructions, pamphlets, or other written materials from your doctor or pharmacy?: 1 - Never    Interpreter Needed?: No  Information entered by :: Clemetine Marker LPN   Activities of Daily Living In your present state of health, do you have any  difficulty performing the following activities: 08/11/2019 07/31/2019  Hearing? N N  Comment declines hearing aids -  Vision? N N  Difficulty concentrating or making decisions? N N  Walking or climbing stairs? N N  Dressing or bathing? N N  Doing errands, shopping? N N  Preparing Food and eating ? N -  Using the Toilet? N -  In the past six months, have you accidently leaked urine? N -  Do you have problems with loss of bowel control? N -  Managing your Medications? N -  Managing your Finances? N -  Housekeeping or managing your Housekeeping? N -  Some recent data might be hidden    Patient Care  Team: Steele Sizer, MD as PCP - General (Family Medicine) Will Bonnet, MD as Attending Physician (Obstetrics and Gynecology) Noreene Filbert, MD as Referring Physician (Radiation Oncology) Phylis Bougie, MD as Referring Physician (Cardiothoracic Surgery) Shirline Frees, MD as Referring Physician (Radiation Oncology) Clent Jacks, RN as Registered Nurse Neldon Labella, RN as Case Manager Gillis Ends, MD as Referring Physician (Obstetrics) Yolonda Kida, MD as Consulting Physician (Cardiology)  Indicate any recent Medical Services you may have received from other than [facility] providers in the past year (date may be approximate).     Assessment:   This is a routine wellness examination for [place].  Hearing/Vision screen  Hearing Screening   125Hz  250Hz  500Hz  1000Hz  2000Hz  3000Hz  4000Hz  6000Hz  8000Hz   Right ear:           Left ear:           Comments: Pt denies hearing difficulty  Vision Screening Comments: Past due for eye exam; established with Dr. Marvel Plan  Dietary issues and exercise activities discussed: Current Exercise Habits: The patient does not participate in regular exercise at present, Exercise limited by: Other - see comments;orthopedic condition(s) (lymphadema)  Goals    . Chronic Disease Management    . Weight (lb) < 250 lb (113.4  kg)     Pt would like to lose weight over the next year with healthy eating and increasing physical activity      Depression Screen PHQ 2/9 Scores 08/11/2019 05/06/2019 08/08/2018 08/05/2018 01/29/2018 07/15/2017 06/24/2017  PHQ - 2 Score 0 1 1 4  0 0 0  PHQ- 9 Score - 2 5 7  0 1 1    Fall Risk Fall Risk  08/11/2019 07/31/2019 05/06/2019 08/08/2018 08/05/2018  Falls in the past year? 0 0 0 0 0  Number falls in past yr: 0 - 0 0 0  Injury with Fall? 0 - 0 0 0  Risk for fall due to : Other (Comment) - - - -  Risk for fall due to: Comment lymphadema - - - -  Follow up Falls prevention discussed Falls prevention discussed - - Falls prevention discussed    Any stairs in or around the home? Yes  If so, are there any without handrails? Yes  Home free of loose throw rugs in walkways, pet beds, electrical cords, etc? Yes  Adequate lighting in your home to reduce risk of falls? Yes   ASSISTIVE DEVICES UTILIZED TO PREVENT FALLS:  Life alert? No  Use of a cane, walker or w/c? No  Grab bars in the bathroom? No  Shower chair or bench in shower? No  Elevated toilet seat or a handicapped toilet? No  TIMED UP AND GO:  Was the test performed? Yes .  Length of time to ambulate 10 feet: 6 sec.   Gait slow and steady without use of assistive device  Cognitive Function: pt declined 6CIT for 2021 AWV; pt has no memory issues     6CIT Screen 08/05/2018  What Year? 0 points  What month? 0 points  What time? 0 points  Count back from 20 0 points  Months in reverse 0 points  Repeat phrase 0 points  Total Score 0    Immunizations Immunization History  Administered Date(s) Administered  . H1N1 02/25/2017  . Influenza, High Dose Seasonal PF 01/29/2018  . Influenza,inj,Quad PF,6+ Mos 11/24/2015  . Influenza-Unspecified 11/13/2010  . Pneumococcal Conjugate-13 08/08/2018  . Pneumococcal Polysaccharide-23 03/21/2017  . Tdap 07/14/2013  . Zoster 11/24/2015  TDAP status: Up to date   Flu Vaccine  status: Up to date   Pneumococcal vaccine status: Up to date   Covid-19 vaccine status: Declined, Education has been provided regarding the importance of this vaccine but patient still declined. Advised may receive this vaccine at local pharmacy or Health Dept.or vaccine clinic. Aware to provide a copy of the vaccination record if obtained from local pharmacy or Health Dept. Verbalized acceptance and understanding.  Qualifies for Shingles Vaccine? Yes   Zostavax completed Yes   Shingrix Completed?: No.    Education has been provided regarding the importance of this vaccine. Patient has been advised to call insurance company to determine out of pocket expense if they have not yet received this vaccine. Advised may also receive vaccine at local pharmacy or Health Dept. Verbalized acceptance and understanding.  Screening Tests Health Maintenance  Topic Date Due  . Fecal DNA (Cologuard)  11/25/2018  . MAMMOGRAM  04/17/2019  . COVID-19 Vaccine (1) 08/27/2019 (Originally 11/11/1962)  . INFLUENZA VACCINE  09/13/2019  . TETANUS/TDAP  07/15/2023  . DEXA SCAN  Completed  . Hepatitis C Screening  Completed  . PNA vac Low Risk Adult  Completed    Health Maintenance  Health Maintenance Due  Topic Date Due  . Fecal DNA (Cologuard)  11/25/2018  . MAMMOGRAM  04/17/2019   Colorectal cancer screening: Cologuard completed 11/25/15. Repeat every 3 years. Patient has kit at home, waiting to complete and send in.   Mammogram status: Completed 04/16/17. Repeat every year   Bone Density status: Completed 08/20/17. Results reflect: Bone density results: OSTEOPENIA. Repeat every 2 years. pt declined repeat screening at this time.   Lung Cancer Screening: (Low Dose CT Chest recommended if Age 49-80 years, 30 pack-year currently smoking OR have quit w/in 15years.) does not qualify.    Additional Screening:  Hepatitis C Screening: does qualify; Completed 06/24/17  Vision Screening: Recommended annual  ophthalmology exams for early detection of glaucoma and other disorders of the eye. Is the patient up to date with their annual eye exam?  No  Who is the provider or what is the name of the office in which the patient attends annual eye exams? Dr. Marvel Plan  Dental Screening: Recommended annual dental exams for proper oral hygiene  Community Resource Referral / Chronic Care Management: CRR required this visit?  No   CCM required this visit?  No      Plan:     I have personally reviewed and noted the following in the patient's chart:   . Medical and social history . Use of alcohol, tobacco or illicit drugs  . Current medications and supplements . Functional ability and status . Nutritional status . Physical activity . Advanced directives . List of other physicians . Hospitalizations, surgeries, and ER visits in previous 12 months . Vitals . Screenings to include cognitive, depression, and falls . Referrals and appointments  In addition, I have reviewed and discussed with patient certain preventive protocols, quality metrics, and best practice recommendations. A written personalized care plan for preventive services as well as general preventive health recommendations were provided to patient.     Clemetine Marker, LPN   [id number]   Nurse Notes: none

## 2019-08-11 NOTE — Patient Instructions (Signed)
[name] , Thank you for taking time to come for your Medicare Wellness Visit. I appreciate your ongoing commitment to your health goals. Please review the following plan we discussed and let me know if I can assist you in the future.   Screening recommendations/referrals: Colonoscopy: Cologuard completed 11/25/15. Please complete and send in your new kit.  Mammogram: done 04/16/17. Please call [phone] to schedule your mammogram.  Bone Density: done 08/20/17 Recommended yearly ophthalmology/optometry visit for glaucoma screening and checkup Recommended yearly dental visit for hygiene and checkup  Vaccinations: Influenza vaccine: postponed Pneumococcal vaccine: done 08/08/18 Tdap vaccine: done 07/14/13 Shingles vaccine: Shingrix discussed. Please contact your pharmacy for coverage information.  Covid-19: discussed  Advanced directives: Please bring a copy of your health care power of attorney and living will to the office at your convenience once you have completed that paperwork.   Conditions/risks identified: Recommend heathy eating and physical activity for desired weight loss  Next appointment: Follow up in one year for your annual wellness visit    Preventive Care 65 Years and Older, Female Preventive care refers to lifestyle choices and visits with your health care provider that can promote health and wellness. What does preventive care include?  A yearly physical exam. This is also called an annual well check.  Dental exams once or twice a year.  Routine eye exams. Ask your health care provider how often you should have your eyes checked.  Personal lifestyle choices, including:  Daily care of your teeth and gums.  Regular physical activity.  Eating a healthy diet.  Avoiding tobacco and drug use.  Limiting alcohol use.  Practicing safe sex.  Taking low-dose aspirin every day.  Taking vitamin and mineral supplements as recommended by your health care  provider. What happens during an annual well check? The services and screenings done by your health care provider during your annual well check will depend on your age, overall health, lifestyle risk factors, and family history of disease. Counseling  Your health care provider may ask you questions about your:  Alcohol use.  Tobacco use.  Drug use.  Emotional well-being.  Home and relationship well-being.  Sexual activity.  Eating habits.  History of falls.  Memory and ability to understand (cognition).  Work and work Statistician.  Reproductive health. Screening  You may have the following tests or measurements:  Height, weight, and BMI.  Blood pressure.  Lipid and cholesterol levels. These may be checked every 5 years, or more frequently if you are over 45 years old.  Skin check.  Lung cancer screening. You may have this screening every year starting at age 94 if you have a 30-pack-year history of smoking and currently smoke or have quit within the past 15 years.  Fecal occult blood test (FOBT) of the stool. You may have this test every year starting at age 20.  Flexible sigmoidoscopy or colonoscopy. You may have a sigmoidoscopy every 5 years or a colonoscopy every 10 years starting at age 50.  Hepatitis C blood test.  Hepatitis B blood test.  Sexually transmitted disease (STD) testing.  Diabetes screening. This is done by checking your blood sugar (glucose) after you have not eaten for a while (fasting). You may have this done every 1-3 years.  Bone density scan. This is done to screen for osteoporosis. You may have this done starting at age 37.  Mammogram. This may be done every 1-2 years. Talk to your health care provider about how often you should have  regular mammograms. Talk with your health care provider about your test results, treatment options, and if necessary, the need for more tests. Vaccines  Your health care provider may recommend certain  vaccines, such as:  Influenza vaccine. This is recommended every year.  Tetanus, diphtheria, and acellular pertussis (Tdap, Td) vaccine. You may need a Td booster every 10 years.  Zoster vaccine. You may need this after age 70.  Pneumococcal 13-valent conjugate (PCV13) vaccine. One dose is recommended after age 91.  Pneumococcal polysaccharide (PPSV23) vaccine. One dose is recommended after age 37. Talk to your health care provider about which screenings and vaccines you need and how often you need them. This information is not intended to replace advice given to you by your health care provider. Make sure you discuss any questions you have with your health care provider. Document Released: 02/25/2015 Document Revised: 10/19/2015 Document Reviewed: 11/30/2014 Elsevier Interactive Patient Education  2017 [place] Prevention in the Home Falls can cause injuries. They can happen to people of all ages. There are many things you can do to make your home safe and to help prevent falls. What can I do on the outside of my home?  Regularly fix the edges of walkways and driveways and fix any cracks.  Remove anything that might make you trip as you walk through a door, such as a raised step or threshold.  Trim any bushes or trees on the path to your home.  Use bright outdoor lighting.  Clear any walking paths of anything that might make someone trip, such as rocks or tools.  Regularly check to see if handrails are loose or broken. Make sure that both sides of any steps have handrails.  Any raised decks and porches should have guardrails on the edges.  Have any leaves, snow, or ice cleared regularly.  Use sand or salt on walking paths during winter.  Clean up any spills in your garage right away. This includes oil or grease spills. What can I do in the bathroom?  Use night lights.  Install grab bars by the toilet and in the tub and shower. Do not use towel bars as grab  bars.  Use non-skid mats or decals in the tub or shower.  If you need to sit down in the shower, use a plastic, non-slip stool.  Keep the floor dry. Clean up any water that spills on the floor as soon as it happens.  Remove soap buildup in the tub or shower regularly.  Attach bath mats securely with double-sided non-slip rug tape.  Do not have throw rugs and other things on the floor that can make you trip. What can I do in the bedroom?  Use night lights.  Make sure that you have a light by your bed that is easy to reach.  Do not use any sheets or blankets that are too big for your bed. They should not hang down onto the floor.  Have a firm chair that has side arms. You can use this for support while you get dressed.  Do not have throw rugs and other things on the floor that can make you trip. What can I do in the kitchen?  Clean up any spills right away.  Avoid walking on wet floors.  Keep items that you use a lot in easy-to-reach places.  If you need to reach something above you, use a strong step stool that has a grab bar.  Keep electrical cords out of the  way.  Do not use floor polish or wax that makes floors slippery. If you must use wax, use non-skid floor wax.  Do not have throw rugs and other things on the floor that can make you trip. What can I do with my stairs?  Do not leave any items on the stairs.  Make sure that there are handrails on both sides of the stairs and use them. Fix handrails that are broken or loose. Make sure that handrails are as long as the stairways.  Check any carpeting to make sure that it is firmly attached to the stairs. Fix any carpet that is loose or worn.  Avoid having throw rugs at the top or bottom of the stairs. If you do have throw rugs, attach them to the floor with carpet tape.  Make sure that you have a light switch at the top of the stairs and the bottom of the stairs. If you do not have them, ask someone to add them for  you. What else can I do to help prevent falls?  Wear shoes that:  Do not have high heels.  Have rubber bottoms.  Are comfortable and fit you well.  Are closed at the toe. Do not wear sandals.  If you use a stepladder:  Make sure that it is fully opened. Do not climb a closed stepladder.  Make sure that both sides of the stepladder are locked into place.  Ask someone to hold it for you, if possible.  Clearly mark and make sure that you can see:  Any grab bars or handrails.  First and last steps.  Where the edge of each step is.  Use tools that help you move around (mobility aids) if they are needed. These include:  Canes.  Walkers.  Scooters.  Crutches.  Turn on the lights when you go into a dark area. Replace any light bulbs as soon as they burn out.  Set up your furniture so you have a clear path. Avoid moving your furniture around.  If any of your floors are uneven, fix them.  If there are any pets around you, be aware of where they are.  Review your medicines with your doctor. Some medicines can make you feel dizzy. This can increase your chance of falling. Ask your doctor what other things that you can do to help prevent falls. This information is not intended to replace advice given to you by your health care provider. Make sure you discuss any questions you have with your health care provider. Document Released: 11/25/2008 Document Revised: 07/07/2015 Document Reviewed: 03/05/2014 Elsevier Interactive Patient Education  2017 [employer].

## 2019-08-12 NOTE — Patient Instructions (Addendum)
Thank you for allowing the Chronic Care Management team to participate in your care.  Goals Addressed            This Visit's Progress   . Chronic Disease Management       CARE PLAN ENTRY (see longitudinal plan of care for additional care plan information)  Current Barriers:  . Chronic Disease Management support and education needs related to HTN, Dyslipidemia and Chronic Acquired Lymphedema. (Hx of Endometrial Cancer)  Case Manager Clinical Goal(s):  Over the next 120 days, patient will: . Continue to take all medications as prescribed. . Attend all medical appointments as scheduled. . Monitor blood pressure and record readings. . Continue to monitor weight and nutritional intake. . Continue to follow recommended safety measures to prevent falls and injuries.   Interventions:  . Inter-disciplinary care team collaboration (see longitudinal plan of care) . Reviewed medications. Encouraged to continue taking all medications as prescribed. Encouraged to notify provider if unable to tolerate prescribed regimen. Encouraged to notify care management team with concerns regarding medication costs.  . Provided information regarding established blood pressure parameters and indications for notifying a provider. Encouraged to monitor a few times a week if unable to monitor daily and record readings. Reports not monitoring often but recalls last reading of 130/74. Marland Kitchen Discussed current plan regarding lymphedema management. Discussed worsening s/sx that require medical intervention. Encouraged to continue monitoring lower extremities and wearing compression stockings as recommended.  . Discussed nutritional intake, heart healthy options and current weight loss goals. Doing very well with monitoring dietary intake, tracking weight and  maintaining a food journal.  Plans to engage in mild activity as tolerated. Very motivated to achieve weight loss goals and improve overall health. Reports current weight  of 275 lbs. . Reviewed scheduled/pending appointments. Encouraged to attend medical appointments as scheduled to prevent delays in care. Encouraged to notify care management team with concerns regarding transportation.  . Discussed plans for ongoing care management and follow-up. Provided direct contact information for care management team.  Patient Self Care Activities:  . Self administers medications  . Attends scheduled provider appointments . Calls pharmacy for medication refills . Performs ADL's independently . Performs IADL's independently . Calls provider office for new concerns or questions   Initial goal documentation         Ms. Frances was given information about Chronic Care Management services including:  1. CCM service includes personalized support from designated clinical staff supervised by her physician, including individualized plan of care and coordination with other care providers 2. 24/7 contact phone numbers for assistance for urgent and routine care needs. 3. Service will only be billed when office clinical staff spend 20 minutes or more in a month to coordinate care. 4. Only one practitioner may furnish and bill the service in a calendar month. 5. The patient may stop CCM services at any time (effective at the end of the month) by phone call to the office staff. 6. The patient will be responsible for cost sharing (co-pay) of up to 20% of the service fee (after annual deductible is met).  Patient agreed to services and verbal consent obtained.    Ms. Courser verbalized understanding of the information discussed during the telephonic outreach today. Declined need for a mailed/printed copy of instructions. Agreed to receive additional educational documents to assist with health goals.   The care management team will follow-up with Ms. Kohlenberg within the next two months.   [facility] [phone]

## 2019-08-18 DIAGNOSIS — Z1211 Encounter for screening for malignant neoplasm of colon: Secondary | ICD-10-CM | POA: Diagnosis not present

## 2019-08-22 LAB — COLOGUARD: Cologuard: POSITIVE — AB

## 2019-08-24 ENCOUNTER — Telehealth: Payer: Self-pay

## 2019-08-24 DIAGNOSIS — R195 Other fecal abnormalities: Secondary | ICD-10-CM

## 2019-08-24 NOTE — Telephone Encounter (Signed)
Copied from [place] [id number]. Topic: General - Inquiry >> Aug 24, 2019 11:17 AM Alanda Slim E wrote: Reason for CRM: abnormal cologard result was faxed to office and Vaughan Basta from [employer] wanted to verify it was received/ please advise   I have not seen these results yet.

## 2019-08-25 ENCOUNTER — Telehealth: Payer: Self-pay

## 2019-08-25 NOTE — Addendum Note (Signed)
Addended by: Loistine Chance on: 08/25/2019 06:27 PM   Modules accepted: Orders

## 2019-08-25 NOTE — Addendum Note (Signed)
Addended by: Chilton Greathouse on: 08/25/2019 09:39 AM   Modules accepted: Orders

## 2019-08-25 NOTE — Telephone Encounter (Signed)
Message sent to Dr. Theora Gianotti that chest Ct results are available.

## 2019-08-26 ENCOUNTER — Telehealth: Payer: Self-pay

## 2019-08-26 NOTE — Telephone Encounter (Signed)
Call placed to Melody Soto to go over recent chest Ct. No answer. Left voicemail to return call.

## 2019-08-28 ENCOUNTER — Ambulatory Visit: Payer: Medicare HMO

## 2019-08-28 NOTE — Chronic Care Management (AMB) (Signed)
  Chronic Care Management   Note  08/28/2019 Name: Melody Soto MRN: [id number] DOB: September 19, 1950   Care Coordination Only: Educational documents and resources submitted    Follow up plan: The care management team will follow-up with [name] next month as scheduled.    [facility] [phone]

## 2019-08-31 ENCOUNTER — Encounter: Payer: Self-pay | Admitting: Family Medicine

## 2019-08-31 ENCOUNTER — Telehealth: Payer: Self-pay

## 2019-08-31 DIAGNOSIS — I77810 Thoracic aortic ectasia: Secondary | ICD-10-CM | POA: Insufficient documentation

## 2019-08-31 NOTE — Progress Notes (Signed)
Discussed with Dr. Theora Gianotti. Lung nodules are considered benign. Will route results to Dr. Ancil Boozer for number 2&3 under impression.

## 2019-08-31 NOTE — Telephone Encounter (Signed)
Second voicemail left with Ms. Denning to return call to go over chest Ct results. Results have been discussed with Dr. Theora Gianotti. Lung nodules are considered benign and we will no longer need to follow these. Will refer to PCP for number 2&3 under impression. Will route results to listed PCP, Dr. Ancil Boozer.  CLINICAL DATA:  69 year old female with history of pulmonary nodules. History of endometrial cancer. Follow-up study.  EXAM: CT CHEST WITHOUT CONTRAST  TECHNIQUE: Multidetector CT imaging of the chest was performed following the standard protocol without IV contrast.  COMPARISON:  Chest CT 04/11/2018.  FINDINGS: Cardiovascular: Heart size is mildly enlarged. There is no significant pericardial fluid, thickening or pericardial calcification. There is aortic atherosclerosis, as well as atherosclerosis of the great vessels of the mediastinum and the coronary arteries, including calcified atherosclerotic plaque in the left anterior descending and left circumflex coronary arteries. Status post TAVR. Mild ectasia of the ascending thoracic aorta (4.0 cm in diameter).  Mediastinum/Nodes: No pathologically enlarged mediastinal or hilar lymph nodes. Please note that accurate exclusion of hilar adenopathy is limited on noncontrast CT scans. Esophagus is unremarkable in appearance. No axillary lymphadenopathy.  Lungs/Pleura: 4 mm right upper lobe nodule (axial image 51 of series 3), unchanged. 4 mm ground-glass attenuation right upper lobe nodule (axial image 37 of series 3), also unchanged. A few other scattered 2-3 mm pulmonary nodules are stable in size and number. No other larger more suspicious appearing pulmonary nodules or masses are noted. No acute consolidative airspace disease. No pleural effusions.  Upper Abdomen: Aortic atherosclerosis. Well-defined 2.4 cm low-attenuation lesion in segment 4B of the liver adjacent to the falciform ligament, previously characterized as a  simple cyst. Peripherally calcified gallstones measuring up to 1.8 cm. Aortic atherosclerosis.  Musculoskeletal: There are no aggressive appearing lytic or blastic lesions noted in the visualized portions of the skeleton.  IMPRESSION: 1. Multiple tiny pulmonary nodules scattered throughout the lungs bilaterally measuring 4 mm or less in size, stable compared to prior examinations, considered definitively benign. No findings to suggest metastatic disease in the thorax. 2. Aortic atherosclerosis, in addition to 2 vessel coronary artery disease. Please note that although the presence of coronary artery calcium documents the presence of coronary artery disease, the severity of this disease and any potential stenosis cannot be assessed on this non-gated CT examination. Assessment for potential risk factor modification, dietary therapy or pharmacologic therapy may be warranted, if clinically indicated. 3. Ectasia of ascending thoracic aorta (4.0 cm in diameter). Recommend annual imaging followup by CTA or MRA. This recommendation follows 2010 ACCF/AHA/AATS/ACR/ASA/SCA/SCAI/SIR/STS/SVM Guidelines for the Diagnosis and Management of Patients with Thoracic Aortic Disease. Circulation. 2010; 121: W979-Y801. Aortic aneurysm NOS (ICD10-I71.9). 4. Status post TAVR.  Aortic Atherosclerosis (ICD10-I70.0).   Electronically Signed   By: Vinnie Langton M.D.   On: 08/11/2019 07:53

## 2019-09-09 ENCOUNTER — Other Ambulatory Visit: Payer: Self-pay

## 2019-09-09 ENCOUNTER — Telehealth (INDEPENDENT_AMBULATORY_CARE_PROVIDER_SITE_OTHER): Payer: Self-pay | Admitting: Gastroenterology

## 2019-09-09 ENCOUNTER — Other Ambulatory Visit: Payer: Self-pay | Admitting: Gastroenterology

## 2019-09-09 DIAGNOSIS — R195 Other fecal abnormalities: Secondary | ICD-10-CM

## 2019-09-09 MED ORDER — PEG 3350-KCL-NA BICARB-NACL 420 G PO SOLR
4000.0000 mL | Freq: Once | ORAL | 0 refills | Status: AC
Start: 2019-09-09 — End: 2019-09-09

## 2019-09-09 NOTE — Progress Notes (Signed)
Gastroenterology Pre-Procedure Review  Request Date: Thursday 09/24/19 Requesting Physician: Dr. Bonna Gains PATIENT REVIEW QUESTIONS: The patient responded to the following health history questions as indicated:    1. Are you having any GI issues? no 2. Do you have a personal history of Polyps? no 3. Do you have a family history of Colon Cancer or Polyps? no 4. Diabetes Mellitus? no 5. Joint replacements in the past 12 months?no 6. Major health problems in the past 3 months?no 7. Any artificial heart valves, MVP, or defibrillator?no    MEDICATIONS & ALLERGIES:    Patient reports the following regarding taking any anticoagulation/antiplatelet therapy:   Plavix, Coumadin, Eliquis, Xarelto, Lovenox, Pradaxa, Brilinta, or Effient? Coumadin prescribed by [facility] Dr. Sharmon Leyden blood thinner request sent to office Aspirin? no  Patient confirms/reports the following medications:  Current Outpatient Medications  Medication Sig Dispense Refill  . atorvastatin (LIPITOR) 40 MG tablet Take 1 tablet (40 mg total) by mouth 3 (three) times a week. 36 tablet 0  . B Complex Vitamins (B COMPLEX 1 PO) Take by mouth.    . Cholecalciferol (VITAMIN D-3) 125 MCG (5000 UT) TABS Take 3 tablets by mouth daily.    . Magnesium 500 MG CAPS Take by mouth.    . metoprolol tartrate (LOPRESSOR) 25 MG tablet Take 1 tablet (25 mg total) by mouth 2 (two) times daily. 60 tablet 5  . telmisartan (MICARDIS) 40 MG tablet TAKE 1 TABLET BY MOUTH EVERY DAY 90 tablet 1  . warfarin (COUMADIN) 5 MG tablet Take 2 tablets by mouth daily. Pt takes 2 tabs (10 mg) daily and 3 tabs (15 mg) on Wednesdays    . ascorbic Acid (VITAMIN C) 500 MG CPCR Take 500 mg by mouth daily. (Patient not taking: Reported on 09/09/2019)    . nystatin (NYSTATIN) powder Apply 1 application topically 3 (three) times daily. (Patient not taking: Reported on 09/09/2019) 15 g 0   No current facility-administered medications for this visit.    Patient  confirms/reports the following allergies:  Allergies  Allergen Reactions  . Ace Inhibitors Other (See Comments)    UNKNOWN REACTION  . Duloxetine Other (See Comments)    Drowsiness    No orders of the defined types were placed in this encounter.   AUTHORIZATION INFORMATION Primary Insurance: 1D#: Group #:  Secondary Insurance: 1D#: Group #:  SCHEDULE INFORMATION: Date: 09/24/19 Time: Location:[facility]

## 2019-09-10 ENCOUNTER — Encounter: Payer: Self-pay | Admitting: Family Medicine

## 2019-09-11 DIAGNOSIS — Z7901 Long term (current) use of anticoagulants: Secondary | ICD-10-CM | POA: Diagnosis not present

## 2019-09-11 DIAGNOSIS — Z952 Presence of prosthetic heart valve: Secondary | ICD-10-CM | POA: Diagnosis not present

## 2019-09-11 DIAGNOSIS — Z5181 Encounter for therapeutic drug level monitoring: Secondary | ICD-10-CM | POA: Diagnosis not present

## 2019-09-14 DIAGNOSIS — Z7901 Long term (current) use of anticoagulants: Secondary | ICD-10-CM | POA: Diagnosis not present

## 2019-09-14 DIAGNOSIS — Z952 Presence of prosthetic heart valve: Secondary | ICD-10-CM | POA: Diagnosis not present

## 2019-09-14 DIAGNOSIS — Z5181 Encounter for therapeutic drug level monitoring: Secondary | ICD-10-CM | POA: Diagnosis not present

## 2019-09-15 ENCOUNTER — Telehealth: Payer: Self-pay

## 2019-09-15 NOTE — Telephone Encounter (Signed)
Patient has been advised to stop Coumadin 5 days prior to her 09/24/19 colonoscopy.  Advised per Dr. Jodi Mourning blood thinner advice received on 09/09/19.  Thanks,  [place], [place]

## 2019-09-22 ENCOUNTER — Other Ambulatory Visit: Payer: Self-pay

## 2019-09-22 ENCOUNTER — Other Ambulatory Visit
Admission: RE | Admit: 2019-09-22 | Discharge: 2019-09-22 | Disposition: A | Discharge status: Home / Self Care | Payer: Medicare HMO | Source: Ambulatory Visit | Attending: Gastroenterology | Admitting: Gastroenterology

## 2019-09-22 DIAGNOSIS — Z01812 Encounter for preprocedural laboratory examination: Secondary | ICD-10-CM | POA: Insufficient documentation

## 2019-09-22 DIAGNOSIS — Z20822 Contact with and (suspected) exposure to covid-19: Secondary | ICD-10-CM | POA: Insufficient documentation

## 2019-09-22 LAB — SARS CORONAVIRUS 2 (TAT 6-24 HRS): SARS Coronavirus 2: NEGATIVE

## 2019-09-22 MED ORDER — SUTAB 1479-225-188 MG PO TABS
ORAL_TABLET | ORAL | 0 refills | Status: DC
Start: 2019-09-22 — End: 2019-10-20

## 2019-09-22 MED ORDER — SUTAB 1479-225-188 MG PO TABS
ORAL_TABLET | ORAL | 0 refills | Status: DC
Start: 2019-09-22 — End: 2019-09-22

## 2019-09-23 ENCOUNTER — Encounter: Payer: Self-pay | Admitting: Gastroenterology

## 2019-09-24 ENCOUNTER — Encounter
Admission: RE | Disposition: A | Discharge status: Home / Self Care | Payer: Self-pay | Source: Home / Self Care | Attending: Gastroenterology

## 2019-09-24 ENCOUNTER — Ambulatory Visit: Payer: Medicare HMO | Admitting: Anesthesiology

## 2019-09-24 ENCOUNTER — Encounter: Payer: Self-pay | Admitting: Gastroenterology

## 2019-09-24 ENCOUNTER — Ambulatory Visit
Admission: RE | Admit: 2019-09-24 | Discharge: 2019-09-24 | Disposition: A | Discharge status: Home / Self Care | Payer: Medicare HMO | Attending: Gastroenterology | Admitting: Gastroenterology

## 2019-09-24 DIAGNOSIS — Z538 Procedure and treatment not carried out for other reasons: Secondary | ICD-10-CM | POA: Diagnosis not present

## 2019-09-24 DIAGNOSIS — Z7901 Long term (current) use of anticoagulants: Secondary | ICD-10-CM | POA: Diagnosis not present

## 2019-09-24 DIAGNOSIS — I480 Paroxysmal atrial fibrillation: Secondary | ICD-10-CM | POA: Diagnosis not present

## 2019-09-24 DIAGNOSIS — Z8542 Personal history of malignant neoplasm of other parts of uterus: Secondary | ICD-10-CM | POA: Insufficient documentation

## 2019-09-24 DIAGNOSIS — I10 Essential (primary) hypertension: Secondary | ICD-10-CM | POA: Insufficient documentation

## 2019-09-24 DIAGNOSIS — E785 Hyperlipidemia, unspecified: Secondary | ICD-10-CM | POA: Diagnosis not present

## 2019-09-24 DIAGNOSIS — Z95812 Presence of fully implantable artificial heart: Secondary | ICD-10-CM | POA: Diagnosis not present

## 2019-09-24 DIAGNOSIS — R195 Other fecal abnormalities: Secondary | ICD-10-CM | POA: Diagnosis not present

## 2019-09-24 DIAGNOSIS — R69 Illness, unspecified: Secondary | ICD-10-CM | POA: Diagnosis not present

## 2019-09-24 DIAGNOSIS — Z79899 Other long term (current) drug therapy: Secondary | ICD-10-CM | POA: Diagnosis not present

## 2019-09-24 DIAGNOSIS — Z923 Personal history of irradiation: Secondary | ICD-10-CM | POA: Insufficient documentation

## 2019-09-24 DIAGNOSIS — I499 Cardiac arrhythmia, unspecified: Secondary | ICD-10-CM | POA: Diagnosis not present

## 2019-09-24 DIAGNOSIS — I4891 Unspecified atrial fibrillation: Secondary | ICD-10-CM | POA: Insufficient documentation

## 2019-09-24 DIAGNOSIS — Z5309 Procedure and treatment not carried out because of other contraindication: Secondary | ICD-10-CM | POA: Insufficient documentation

## 2019-09-24 DIAGNOSIS — Z85820 Personal history of malignant melanoma of skin: Secondary | ICD-10-CM | POA: Diagnosis not present

## 2019-09-24 HISTORY — PX: COLONOSCOPY WITH PROPOFOL: SHX5780

## 2019-09-24 HISTORY — DX: Cardiac arrhythmia, unspecified: I49.9

## 2019-09-24 LAB — POCT I-STAT, CHEM 8
BUN: 12 mg/dL (ref 8–23)
Calcium, Ion: 1.24 mmol/L (ref 1.15–1.40)
Chloride: 108 mmol/L (ref 98–111)
Creatinine, Ser: 0.6 mg/dL (ref 0.44–1.00)
Glucose, Bld: 110 mg/dL — ABNORMAL HIGH (ref 70–99)
HCT: 42 % (ref 36.0–46.0)
Hemoglobin: 14.3 g/dL (ref 12.0–15.0)
Potassium: 4.6 mmol/L (ref 3.5–5.1)
Sodium: 142 mmol/L (ref 135–145)
TCO2: 25 mmol/L (ref 22–32)

## 2019-09-24 SURGERY — COLONOSCOPY WITH PROPOFOL
Anesthesia: General

## 2019-09-24 MED ORDER — METOPROLOL TARTRATE 25 MG PO TABS
ORAL_TABLET | ORAL | Status: AC
  Administered 2019-09-24: 25 mg via ORAL
  Filled 2019-09-24: qty 1

## 2019-09-24 MED ORDER — PROPOFOL 500 MG/50ML IV EMUL
INTRAVENOUS | Status: DC | PRN
  Administered 2019-09-24: 120 ug/kg/min via INTRAVENOUS

## 2019-09-24 MED ORDER — SODIUM CHLORIDE 0.9 % IV SOLN
INTRAVENOUS | Status: DC
  Administered 2019-09-24: 1000 mL via INTRAVENOUS

## 2019-09-24 MED ORDER — METOPROLOL TARTRATE 25 MG PO TABS: 25.0000 mg | ORAL_TABLET | Freq: Two times a day (BID) | ORAL | Status: DC

## 2019-09-24 MED ORDER — MIDAZOLAM HCL 2 MG/2ML IJ SOLN
INTRAMUSCULAR | Status: AC
  Filled 2019-09-24: qty 2

## 2019-09-24 MED ORDER — METOPROLOL TARTRATE 5 MG/5ML IV SOLN: 2.5000 mg | Freq: Once | INTRAVENOUS | Status: AC

## 2019-09-24 MED ORDER — METOPROLOL TARTRATE 5 MG/5ML IV SOLN
INTRAVENOUS | Status: AC
  Administered 2019-09-24: 2.5 mg via INTRAVENOUS
  Filled 2019-09-24: qty 5

## 2019-09-24 MED ORDER — PROPOFOL 500 MG/50ML IV EMUL
INTRAVENOUS | Status: AC
  Filled 2019-09-24: qty 50

## 2019-09-24 MED ORDER — MIDAZOLAM HCL 2 MG/2ML IJ SOLN
INTRAMUSCULAR | Status: DC | PRN
  Administered 2019-09-24: 2 mg via INTRAVENOUS

## 2019-09-24 NOTE — Progress Notes (Signed)
Pt received in recovery. Cardiology consult and EKG completed. Pt alert, verbal with no noted distress. Administered Metoprolol per MD order. Pt denies pain or discomfort. Will continue to monitor.

## 2019-09-24 NOTE — Consult Note (Signed)
CARDIOLOGY CONSULT NOTE               Patient ID: Melody Soto MRN: [id number] DOB/AGE: 68-Nov-1952 68 y.o.  Admit date: 09/24/2019 Referring Physician Bonna Gains Primary Physician [facility] Primary Cardiologist Dr. Clayborn Bigness, Dr. Elnita Maxwell, NP Reason for Consultation Atrial fibrillation with RVR  HPI: 69 year old referred for evaluation of atrial fibrillation with RVR in the setting of pre-colonoscopy. 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 The patient was scheduled for colonoscopy this morning for evaluation of positive Cologuard screen. While in the pre-procedural suite, the patient was placed on cardiac monitoring and was noted to be in rapid atrial fibrillation with rates 110s-150s. The patient experienced mild palpitations without shortness of breath or chest pain. A cardiology consult was placed for evaluation. The patient denies a known history of atrial fibrillation. She reports that when she forgets to take her metoprolol, she does experience palpitations, and forgot to take it this morning prior to this scheduled procedure. She has been holding warfarin for 5 days as advised. ECG reveals atrial fibrillation at a rate of 119 bpm with nonspecific T wave abnormalities without evidence of acute ischemia.  I-stat chemistry panel reveals sodium 142, potassium 4.56, glucose 110, chloride 108.   Review of systems complete and found to be negative unless listed above     Past Medical History:  Diagnosis Date  . Aortic stenosis   . Cataract   . Depression   . Dysrhythmia   . Endometrial cancer determined by uterine biopsy ([place]) 10/29/2016   Rad tx's and internal brachytherapy.  Marland Kitchen Heart murmur    HX OF  . Hyperlipidemia   . Hypertension    CONTROLLED ON MEDS  . Lymphedema   . Shortness of breath dyspnea     Past Surgical History:  Procedure Laterality Date  .  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 cataract surgery Bilateral 05/24/2014   second eye 06/07/2014  . MELANOMA EXCISION    . OVARIAN CYST REMOVAL     EXPLORATORY LAPAROTOMY  . TONSILLECTOMY      Medications Prior to Admission  Medication Sig Dispense Refill Last Dose  . atorvastatin (LIPITOR) 40 MG tablet Take 1 tablet (40 mg total) by mouth 3 (three) times a week. 36 tablet 0 Past Week at Unknown time  . metoprolol tartrate (LOPRESSOR) 25 MG tablet Take 1 tablet (25 mg total) by mouth 2 (two) times daily. 60 tablet 5 09/23/2019 at Unknown time  . ascorbic Acid (VITAMIN C) 500 MG CPCR Take 500 mg by mouth daily. (Patient not taking: Reported on 09/09/2019)     . B Complex Vitamins (B COMPLEX 1 PO) Take by mouth.     . Cholecalciferol (VITAMIN D-3) 125 MCG (5000 UT) TABS Take 3 tablets by mouth daily.     . Magnesium 500 MG CAPS Take by mouth.     . nystatin (NYSTATIN) powder Apply 1 application topically 3 (three) times daily. (Patient not taking: Reported on 09/09/2019) 15 g 0   . Sodium Sulfate-Mag Sulfate-KCl (SUTAB) [phone] MG TABS At 5 PM take 12 tablets using the 8 oz cup provided in the kit drinking 5 cups of water and 5 hours before your procedure repeat the same process. 24 tablet 0   . telmisartan (MICARDIS) 40 MG tablet TAKE 1 TABLET BY MOUTH EVERY DAY 90 tablet 1   . warfarin (COUMADIN) 5 MG tablet Take 2  tablets by mouth daily. Pt takes 2 tabs (10 mg) daily and 3 tabs (15 mg) on Wednesdays   09/19/2019   Social History   Socioeconomic History  . Marital status: Divorced    Spouse name: Not on file  . Number of children: 2  . Years of education: Not on file  . Highest education level: Bachelor's degree (e.g., BA, AB, BS)  Occupational History  . Occupation: caregiver    Employer: [employer]    Comment: caregiver  Tobacco Use  . Smoking status: Never Smoker  . Smokeless tobacco: Never Used  Vaping Use  . Vaping Use: Never used  Substance and  Sexual Activity  . Alcohol use: Not Currently    Alcohol/week: 0.0 standard drinks  . Drug use: No  . Sexual activity: Not Currently    Birth control/protection: Post-menopausal  Other Topics Concern  . Not on file  Social History Narrative   She is divorced   She has two daughters   The youngest is not currently talking to her since 07/2018. She got pregnant without being married and had the baby in October.    Social Determinants of Health   Financial Resource Strain: Medium Risk  . Difficulty of Paying Living Expenses: Somewhat hard  Food Insecurity: No Food Insecurity  . Worried About Charity fundraiser in the Last Year: Never true  . Ran Out of Food in the Last Year: Never true  Transportation Needs: No Transportation Needs  . Lack of Transportation (Medical): No  . Lack of Transportation (Non-Medical): No  Physical Activity: Inactive  . Days of Exercise per Week: 0 days  . Minutes of Exercise per Session: 0 min  Stress: No Stress Concern Present  . Feeling of Stress : Only a little  Social Connections: Moderately Integrated  . Frequency of Communication with Friends and Family: More than three times a week  . Frequency of Social Gatherings with Friends and Family: Twice a week  . Attends Religious Services: More than 4 times per year  . Active Member of Clubs or Organizations: Yes  . Attends Archivist Meetings: More than 4 times per year  . Marital Status: Divorced  Human resources officer Violence: Not At Risk  . Fear of Current or Ex-Partner: No  . Emotionally Abused: No  . Physically Abused: No  . Sexually Abused: No    Family History  Problem Relation Age of Onset  . Cancer Mother   . Stroke Mother   . Thyroid nodules Mother   . Diabetes Sister   . Hypertension Sister   . Breast cancer Maternal Aunt   . Bone cancer Maternal Uncle   . Depression Daughter   . Depression Daughter       Review of systems complete and found to be negative unless listed  above      PHYSICAL EXAM  General: Well developed, well nourished, in no acute distress, lying supine HEENT:  Normocephalic and atramatic Neck:  No JVD.  Lungs: Clear bilaterally to auscultation, normal effort of breathing on supplemental oxygen Heart: irregularly irregular, without murmurs.  Abdomen: nondistended Msk:  Back normal, gait not assessed.  Extremities: Bilateral lower extremity edema with compression wraps in place Neuro: Alert and oriented X 3. Psych:  Good affect, responds appropriately  Labs:   Lab Results  Component Value Date   WBC 6.3 05/06/2019   HGB 14.3 05/06/2019   HCT 43.2 05/06/2019   MCV 98.4 05/06/2019   PLT 257  05/06/2019   No results for input(s): NA, K, CL, CO2, BUN, CREATININE, CALCIUM, PROT, BILITOT, ALKPHOS, ALT, AST, GLUCOSE in the last 168 hours.  Invalid input(s): LABALBU No results found for: CKTOTAL, CKMB, CKMBINDEX, TROPONINI  Lab Results  Component Value Date   CHOL 205 (H) 08/08/2018   CHOL 194 06/24/2017   CHOL 194 07/02/2016   Lab Results  Component Value Date   HDL 48 (L) 08/08/2018   HDL 50 (L) 06/24/2017   HDL 49 (L) 07/02/2016   Lab Results  Component Value Date   LDLCALC 125 (H) 08/08/2018   LDLCALC 112 (H) 06/24/2017   LDLCALC 112 (H) 07/02/2016   Lab Results  Component Value Date   TRIG 200 (H) 08/08/2018   TRIG 199 (H) 06/24/2017   TRIG 163 (H) 07/02/2016   Lab Results  Component Value Date   CHOLHDL 4.3 08/08/2018   CHOLHDL 3.9 06/24/2017   CHOLHDL 4.0 07/02/2016   No results found for: LDLDIRECT    Radiology: No results found.  EKG: atrial fibrillation, rate 119 bpm  ASSESSMENT AND PLAN:  1. Atrial fibrillation with RVR, with no prior diagnosis, while awaiting scheduled colonoscopy for positive cologuard. Patient minimally symptomatic. Rate 110-130s. She did not take her daily metoprolol this morning, which is likely causing reflex tachycardia. Patient received IV lopressor 2.5 mg and home  metoprolol tartrate 25 mg with minimal improvement of heart rate.  Recommend proceeding with colonoscopy if heart rate is better controlled, ~110 bpm.  Anesthesia prefers to postpone colonoscopy at this time. 2. Aortic stenosis, status post TAVR 02/2017 3. 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 Scheduled for echocardiogram next week.  Recommendations: 1. Give IV lopressor 2.5 mg now, as well as oral metoprolol tartrate 25 mg. 2. Postpone colonoscopy at this time due to rate 110-130s 3. 72-hr Holter monitor to be placed prior to discharge 4. Follow-up with Dr. Clayborn Bigness on 8/25 at 3:15 PM to discuss results of Holter 5. Resume warfarin   6. Continue metoprolol tartrate 25 mg BID  7. 2D echocardiogram as scheduled per [facility] cardiologist.  Signed: Clabe Seal PA-C 09/24/2019, 10:24 AM   Discussed with Dr. Saralyn Pilar who agrees with the above plan.

## 2019-09-24 NOTE — Interval H&P Note (Signed)
Pt was taken the endo room, but was noted to be in A. fib with heart rate going up to the 140s.  Therefore, Dr. Ronelle Nigh does not recommend proceeding with the procedure.  Therefore, the procedure was not started.  Dr. Ronelle Nigh is consulting cardiology for evaluation of the patient

## 2019-09-24 NOTE — H&P (Signed)
Vonda Antigua, MD [address], [place], [place], [place], [address] [address], [place], [place], [place], [address] Phone: [phone]  Fax: [phone]  Primary Care Physician:  Steele Sizer, MD   Pre-Procedure History & Physical: HPI:  Melody Soto is a 69 y.o. female is here for a colonoscopy.   Past Medical History:  Diagnosis Date  . Aortic stenosis   . Cataract   . Depression   . Dysrhythmia   . Endometrial cancer determined by uterine biopsy ([facility]) 10/29/2016   Rad tx's and internal brachytherapy.  Marland Kitchen Heart murmur    HX OF  . Hyperlipidemia   . Hypertension    CONTROLLED ON MEDS  . Lymphedema   . Shortness of breath dyspnea     Past Surgical History:  Procedure Laterality Date  . 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 cataract surgery Bilateral 05/24/2014   second eye 06/07/2014  . MELANOMA EXCISION    . OVARIAN CYST REMOVAL     EXPLORATORY LAPAROTOMY  . TONSILLECTOMY      Prior to Admission medications   Medication Sig Start Date End Date Taking? Authorizing Provider  atorvastatin (LIPITOR) 40 MG tablet Take 1 tablet (40 mg total) by mouth 3 (three) times a week. 05/06/19  Yes Sowles, Drue Stager, MD  metoprolol tartrate (LOPRESSOR) 25 MG tablet Take 1 tablet (25 mg total) by mouth 2 (two) times daily. 07/29/19  Yes Sowles, Drue Stager, MD  ascorbic Acid (VITAMIN C) 500 MG CPCR Take 500 mg by mouth daily. Patient not taking: Reported on 09/09/2019    [provider]  B Complex Vitamins (B COMPLEX 1 PO) Take by mouth.    [provider]  Cholecalciferol (VITAMIN D-3) 125 MCG (5000 UT) TABS Take 3 tablets by mouth daily.    [provider]  Magnesium 500 MG CAPS Take by mouth.    [provider]  nystatin (NYSTATIN) powder Apply 1 application topically 3 (three) times daily. Patient not taking: Reported on 09/09/2019 07/30/19   Gillis Ends, MD  Sodium Sulfate-Mag Sulfate-KCl (SUTAB) [phone]  MG TABS At 5 PM take 12 tablets using the 8 oz cup provided in the kit drinking 5 cups of water and 5 hours before your procedure repeat the same process. 09/22/19   [name], MD  telmisartan (MICARDIS) 40 MG tablet TAKE 1 TABLET BY MOUTH EVERY DAY 06/13/19   Steele Sizer, MD  warfarin (COUMADIN) 5 MG tablet Take 2 tablets by mouth daily. Pt takes 2 tabs (10 mg) daily and 3 tabs (15 mg) on Wednesdays 05/23/19   [provider]    Allergies as of 09/09/2019 - Review Complete 09/09/2019  Allergen Reaction Noted  . Ace inhibitors Other (See Comments) 08/21/2014  . Duloxetine Other (See Comments) 08/10/2016    Family History  Problem Relation Age of Onset  . Cancer Mother   . Stroke Mother   . Thyroid nodules Mother   . Diabetes Sister   . Hypertension Sister   . Breast cancer Maternal Aunt   . Bone cancer Maternal Uncle   . Depression Daughter   . Depression Daughter     Social History   Socioeconomic History  . Marital status: Divorced    Spouse name: Not on file  . Number of children: 2  . Years of education: Not on file  . Highest education level: Bachelor's degree (e.g., BA, AB, BS)  Occupational History  . Occupation: Physiological scientist: [employer]  Comment: caregiver  Tobacco Use  . Smoking status: Never Smoker  . Smokeless tobacco: Never Used  Vaping Use  . Vaping Use: Never used  Substance and Sexual Activity  . Alcohol use: Not Currently    Alcohol/week: 0.0 standard drinks  . Drug use: No  . Sexual activity: Not Currently    Birth control/protection: Post-menopausal  Other Topics Concern  . Not on file  Social History Narrative   She is divorced   She has two daughters   The youngest is not currently talking to her since 07/2018. She got pregnant without being married and had the baby in October.    Social Determinants of Health   Financial Resource Strain: Medium Risk  . Difficulty of Paying Living Expenses:  Somewhat hard  Food Insecurity: No Food Insecurity  . Worried About Charity fundraiser in the Last Year: Never true  . Ran Out of Food in the Last Year: Never true  Transportation Needs: No Transportation Needs  . Lack of Transportation (Medical): No  . Lack of Transportation (Non-Medical): No  Physical Activity: Inactive  . Days of Exercise per Week: 0 days  . Minutes of Exercise per Session: 0 min  Stress: No Stress Concern Present  . Feeling of Stress : Only a little  Social Connections: Moderately Integrated  . Frequency of Communication with Friends and Family: More than three times a week  . Frequency of Social Gatherings with Friends and Family: Twice a week  . Attends Religious Services: More than 4 times per year  . Active Member of Clubs or Organizations: Yes  . Attends Archivist Meetings: More than 4 times per year  . Marital Status: Divorced  Human resources officer Violence: Not At Risk  . Fear of Current or Ex-Partner: No  . Emotionally Abused: No  . Physically Abused: No  . Sexually Abused: No    Review of Systems: See HPI, otherwise negative ROS  Physical Exam: BP 134/90   Pulse 70   Temp 97.9 F (36.6 C) (Tympanic)   Resp 18   Ht 5' 8.5" (1.74 m)   Wt 121.6 kg   SpO2 97%   BMI 40.16 kg/m  General:   Alert,  pleasant and cooperative in NAD Head:  Normocephalic and atraumatic. Neck:  Supple; no masses or thyromegaly. Lungs:  Clear throughout to auscultation, normal respiratory effort.    Heart:  +S1, +S2, Regular rate and rhythm, No edema. Abdomen:  Soft, nontender and nondistended. Normal bowel sounds, without guarding, and without rebound.   Neurologic:  Alert and  oriented x4;  grossly normal neurologically.  Impression/Plan: Melody Soto is here for a colonoscopy to be performed for positive cologuard.  Risks, benefits, limitations, and alternatives regarding  colonoscopy have been reviewed with the patient.  Questions have been  answered.  All parties agreeable.   [name], MD  09/24/2019, 9:26 AM

## 2019-09-24 NOTE — Anesthesia Preprocedure Evaluation (Signed)
Anesthesia Evaluation  Patient identified by MRN, date of birth, ID band Patient awake    Reviewed: Allergy & Precautions, NPO status , Patient's Chart, lab work & pertinent test results  History of Anesthesia Complications Negative for: history of anesthetic complications  Airway Mallampati: II       Dental   Pulmonary neg sleep apnea, neg COPD, Not current smoker,           Cardiovascular hypertension, Pt. on medications (-) Past MI and (-) CHF + dysrhythmias (occassional palpatations ) + Valvular Problems/Murmurs (s/p aortic valve replacement)      Neuro/Psych neg Seizures Depression    GI/Hepatic Neg liver ROS, neg GERD  ,  Endo/Other  neg diabetes  Renal/GU negative Renal ROS     Musculoskeletal   Abdominal   Peds  Hematology   Anesthesia Other Findings   Reproductive/Obstetrics                             Anesthesia Physical Anesthesia Plan  ASA: III  Anesthesia Plan: General   Post-op Pain Management:    Induction: Intravenous  PONV Risk Score and Plan: 3 and Propofol infusion, TIVA and Treatment may vary due to age or medical condition  Airway Management Planned: Nasal Cannula  Additional Equipment:   Intra-op Plan:   Post-operative Plan:   Informed Consent: I have reviewed the patients History and Physical, chart, labs and discussed the procedure including the risks, benefits and alternatives for the proposed anesthesia with the patient or authorized representative who has indicated his/her understanding and acceptance.       Plan Discussed with:   Anesthesia Plan Comments:         Anesthesia Quick Evaluation

## 2019-09-24 NOTE — Transfer of Care (Signed)
Immediate Anesthesia Transfer of Care Note  Patient: Melody Soto  Procedure(s) Performed: COLONOSCOPY WITH PROPOFOL (N/A )  Patient Location: PACU  Anesthesia Type:General  Level of Consciousness: awake and alert   Airway & Oxygen Therapy: Patient Spontanous Breathing and Patient connected to face mask oxygen  Post-op Assessment: Report given to RN and Post -op Vital signs reviewed and stable  Post vital signs: Reviewed and stable  Last Vitals:  Vitals Value Taken Time  BP    Temp    Pulse    Resp    SpO2      Last Pain:  Vitals:   09/24/19 0901  TempSrc: Tympanic  PainSc: 0-No pain         Complications: No complications documented.

## 2019-09-24 NOTE — Anesthesia Postprocedure Evaluation (Addendum)
Anesthesia Post Note  Patient: Melody Soto  Procedure(s) Performed: COLONOSCOPY WITH PROPOFOL (N/A )  Patient location during evaluation: Endoscopy Anesthesia Type: General Level of consciousness: awake and alert Pain management: pain level controlled Vital Signs Assessment: post-procedure vital signs reviewed and stable Respiratory status: spontaneous breathing and respiratory function stable Cardiovascular status: tachycardic Anesthetic complications: no Comments: Called for induction, pt found to be apparently in afib with HR 140-150s. Sedation stopped, pt taken to recovery, EKG ordered and cardiology consult called. Cardiology dosed with lopressor and oral metoprolol. HR remains in th 140s. Will not do procedure today. Pt to receive Holter monitor from cardiology, and pt will fu with them.    No complications documented.   Last Vitals:  Vitals:   09/24/19 0901 09/24/19 0951  BP: 134/90 117/86  Pulse: 70   Resp: 18   Temp: 36.6 C 36.4 C  SpO2: 97%     Last Pain:  Vitals:   09/24/19 0951  TempSrc: Temporal  PainSc: 0-No pain                 Sherri Mcarthy K

## 2019-09-25 ENCOUNTER — Telehealth: Payer: Self-pay

## 2019-09-25 ENCOUNTER — Encounter: Payer: Self-pay | Admitting: Gastroenterology

## 2019-09-25 NOTE — Telephone Encounter (Signed)
-----   Message from [name], MD sent at 09/24/2019 11:53 AM EDT ----- This pt went into A-fib before her procedure today and procedure was cancelled. We will need to wait till cardiology can clear her for her procedure. Melody Soto can you set up phone visit in 3 weeks and we can see how shes doing and ask for cardiology clearance then

## 2019-09-25 NOTE — Telephone Encounter (Signed)
I will be sending a cardiac and blood thinner clearance in 3 weeks after she sees Dr. Bonna Gains on 10/20/2019 so we could try to reschedule her procedure.

## 2019-09-30 ENCOUNTER — Ambulatory Visit: Payer: Medicare HMO

## 2019-10-02 DIAGNOSIS — E782 Mixed hyperlipidemia: Secondary | ICD-10-CM | POA: Diagnosis not present

## 2019-10-02 DIAGNOSIS — Z952 Presence of prosthetic heart valve: Secondary | ICD-10-CM | POA: Diagnosis not present

## 2019-10-02 DIAGNOSIS — I480 Paroxysmal atrial fibrillation: Secondary | ICD-10-CM | POA: Diagnosis not present

## 2019-10-02 DIAGNOSIS — I77810 Thoracic aortic ectasia: Secondary | ICD-10-CM | POA: Diagnosis not present

## 2019-10-02 DIAGNOSIS — I083 Combined rheumatic disorders of mitral, aortic and tricuspid valves: Secondary | ICD-10-CM | POA: Diagnosis not present

## 2019-10-02 DIAGNOSIS — I10 Essential (primary) hypertension: Secondary | ICD-10-CM | POA: Diagnosis not present

## 2019-10-05 NOTE — Chronic Care Management (AMB) (Signed)
  Chronic Care Management   Note   Name: Melody Soto MRN: [id number] DOB: Aug 19, 1950  [name] is engaged with the chronic care management team. A routine telephonic outreach was attempted today. Reports being unavailable at the time of the call. Agreeable to outreach within the next week.     Follow up plan: A member of the care management team will reach out to [name] within the next week.     [facility] Care Management [facility] [phone]

## 2019-10-07 DIAGNOSIS — I480 Paroxysmal atrial fibrillation: Secondary | ICD-10-CM | POA: Diagnosis not present

## 2019-10-07 DIAGNOSIS — I10 Essential (primary) hypertension: Secondary | ICD-10-CM | POA: Diagnosis not present

## 2019-10-07 DIAGNOSIS — I340 Nonrheumatic mitral (valve) insufficiency: Secondary | ICD-10-CM | POA: Diagnosis not present

## 2019-10-07 DIAGNOSIS — I890 Lymphedema, not elsewhere classified: Secondary | ICD-10-CM | POA: Diagnosis not present

## 2019-10-07 DIAGNOSIS — I208 Other forms of angina pectoris: Secondary | ICD-10-CM | POA: Diagnosis not present

## 2019-10-07 DIAGNOSIS — R011 Cardiac murmur, unspecified: Secondary | ICD-10-CM | POA: Diagnosis not present

## 2019-10-07 DIAGNOSIS — Z952 Presence of prosthetic heart valve: Secondary | ICD-10-CM | POA: Diagnosis not present

## 2019-10-07 DIAGNOSIS — E782 Mixed hyperlipidemia: Secondary | ICD-10-CM | POA: Diagnosis not present

## 2019-10-07 DIAGNOSIS — G4733 Obstructive sleep apnea (adult) (pediatric): Secondary | ICD-10-CM | POA: Diagnosis not present

## 2019-10-09 ENCOUNTER — Ambulatory Visit (INDEPENDENT_AMBULATORY_CARE_PROVIDER_SITE_OTHER): Payer: Medicare HMO

## 2019-10-09 DIAGNOSIS — I480 Paroxysmal atrial fibrillation: Secondary | ICD-10-CM | POA: Diagnosis not present

## 2019-10-09 NOTE — Chronic Care Management (AMB) (Signed)
Chronic Care Management   Follow Up Note   10/09/2019 Name: Melody Soto MRN: [id number] DOB: 23-Aug-1950  Primary Care Provider: Steele Sizer, MD Reason for referral : Chronic Care Management   Melody Soto is a 69 y.o. year old female who is a primary care patient of Steele Sizer, MD. She is currently engaged with the chronic care management team. A routine telephonic outreach was conducted today.  Review of Ms. Speigner's status, including review of consultants reports, relevant labs and test results was conducted today. Collaboration with appropriate care team members was performed as part of the comprehensive evaluation and provision of chronic care management services.    SDOH (Social Determinants of Health) assessments performed: No   Outpatient Encounter Medications as of 10/09/2019  Medication Sig  . Sodium Sulfate-Mag Sulfate-KCl (SUTAB) [phone] MG TABS At 5 PM take 12 tablets using the 8 oz cup provided in the kit drinking 5 cups of water and 5 hours before your procedure repeat the same process.  Marland Kitchen telmisartan (MICARDIS) 40 MG tablet TAKE 1 TABLET BY MOUTH EVERY DAY  . warfarin (COUMADIN) 5 MG tablet Take 2 tablets by mouth daily. Pt takes 2 tabs (10 mg) daily and 3 tabs (15 mg) on Wednesdays  . ascorbic Acid (VITAMIN C) 500 MG CPCR Take 500 mg by mouth daily. (Patient not taking: Reported on 09/09/2019)  . atorvastatin (LIPITOR) 40 MG tablet Take 1 tablet (40 mg total) by mouth 3 (three) times a week.  . B Complex Vitamins (B COMPLEX 1 PO) Take by mouth.  . Cholecalciferol (VITAMIN D-3) 125 MCG (5000 UT) TABS Take 3 tablets by mouth daily.  . Magnesium 500 MG CAPS Take by mouth.  . metoprolol tartrate (LOPRESSOR) 25 MG tablet Take 1 tablet (25 mg total) by mouth 2 (two) times daily.  Marland Kitchen nystatin (NYSTATIN) powder Apply 1 application topically 3 (three) times daily. (Patient not taking: Reported on 09/09/2019)   No facility-administered encounter medications  on file as of 10/09/2019.      Goals Addressed            This Visit's Progress   . Chronic Disease Management       CARE PLAN ENTRY (see longitudinal plan of care for additional care plan information)  Current Barriers:  . Chronic Disease Management support and education needs related to HTN, Dyslipidemia and Chronic Acquired Lymphedema. (Hx of Endometrial Cancer)  Case Manager Clinical Goal(s):  Over the next 120 days, patient will: . Continue to take all medications as prescribed. . Attend all medical appointments as scheduled. . Monitor blood pressure and record readings. . Continue to monitor weight and nutritional intake. . Continue to follow recommended safety measures to prevent falls and injuries.   Interventions:  . Inter-disciplinary care team collaboration (see longitudinal plan of care) . Discussed plans for ongoing care management and follow-up. Reports feeling well today. Continues to take medications as prescribed and adhering to treatment recommendations. Since the last outreach a colonoscopy was scheduled d/t a positive Cologuard. She was noted to be in A-Fib during the initial sedation and the procedure was cancelled. Denies complaints of chest discomfort or palpitations. She is aware of s/sx that require immediate medical attention. She is pending follow-up with the Cardiology and Gastroenterology team. Agreed to follow-up outreach within the next few weeks to discuss medication changes and possible care management needs.   Patient Self Care Activities:  . Self administers medications  . Attends scheduled provider appointments . Calls pharmacy for  medication refills . Performs ADL's independently . Performs IADL's independently . Calls provider office for new concerns or questions   Please see past updates related to this goal by clicking on the "Past Updates" button in the selected goal           PLAN The care management team will follow-up with Ms.  Younge within the next few weeks.    [facility] Care [facility] [phone]

## 2019-10-14 DIAGNOSIS — I440 Atrioventricular block, first degree: Secondary | ICD-10-CM | POA: Diagnosis not present

## 2019-10-14 DIAGNOSIS — I480 Paroxysmal atrial fibrillation: Secondary | ICD-10-CM | POA: Diagnosis not present

## 2019-10-14 DIAGNOSIS — I10 Essential (primary) hypertension: Secondary | ICD-10-CM | POA: Diagnosis not present

## 2019-10-14 HISTORY — PX: CARDIOVERSION: SHX1299

## 2019-10-20 ENCOUNTER — Encounter: Payer: Self-pay | Admitting: Family Medicine

## 2019-10-20 ENCOUNTER — Telehealth: Payer: Self-pay

## 2019-10-20 ENCOUNTER — Other Ambulatory Visit: Payer: Self-pay | Admitting: Gastroenterology

## 2019-10-20 ENCOUNTER — Ambulatory Visit (INDEPENDENT_AMBULATORY_CARE_PROVIDER_SITE_OTHER): Payer: Medicare HMO | Admitting: Gastroenterology

## 2019-10-20 ENCOUNTER — Encounter: Payer: Self-pay | Admitting: Gastroenterology

## 2019-10-20 ENCOUNTER — Other Ambulatory Visit: Payer: Self-pay

## 2019-10-20 VITALS — BP 111/72 | HR 66 | Temp 98.5°F

## 2019-10-20 DIAGNOSIS — R195 Other fecal abnormalities: Secondary | ICD-10-CM | POA: Diagnosis not present

## 2019-10-20 DIAGNOSIS — K625 Hemorrhage of anus and rectum: Secondary | ICD-10-CM | POA: Diagnosis not present

## 2019-10-20 MED ORDER — PEG 3350-KCL-NA BICARB-NACL 420 G PO SOLR
ORAL | 0 refills | Status: DC
Start: 2019-10-20 — End: 2019-10-20

## 2019-10-20 MED ORDER — PEG 3350-KCL-NA BICARB-NACL 420 G PO SOLR
ORAL | 0 refills | Status: DC
Start: 2019-10-20 — End: 2019-11-19

## 2019-10-20 NOTE — Addendum Note (Signed)
Addended by: Wayna Chalet on: 10/20/2019 05:28 PM   Modules accepted: Orders

## 2019-10-20 NOTE — Progress Notes (Signed)
Melody Soto [address]  [place]  [place], [place] [address]  Main: [phone]  Fax: [phone]   Gastroenterology Consultation  Referring Provider:     Steele Sizer, MD Primary Care Physician:  Steele Sizer, MD Reason for Consultation:    Positive Cologuard        HPI:    Chief Complaint  Patient presents with  . Follow-up    positive cologard    Melody Soto is a 69 y.o. y/o female referred for consultation & management  by Dr. Ancil Boozer, Drue Stager, MD.  Patient was scheduled for a colonoscopy due to positive Cologuard when she was referred by her primary care doctor.  Right before the procedure, patient was found to be in A. fib with RVR and the procedure was canceled.  Patient has since seen cardiology and has been cardioverted.  She remains on Coumadin.  She does report intermittent bright red blood per rectum for the last 3 months.  No prior colonoscopy.  No family history of colon cancer.  No weight loss or abdominal pain.  Past Medical History:  Diagnosis Date  . Aortic stenosis   . Cataract   . Depression   . Dysrhythmia   . Endometrial cancer determined by uterine biopsy ([facility]) 10/29/2016   Rad tx's and internal brachytherapy.  Marland Kitchen Heart murmur    HX OF  . Hyperlipidemia   . Hypertension    CONTROLLED ON MEDS  . Lymphedema   . Shortness of breath dyspnea     Past Surgical History:  Procedure Laterality Date  . 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 cataract surgery Bilateral 05/24/2014   second eye 06/07/2014  . COLONOSCOPY WITH PROPOFOL N/A 09/24/2019   Procedure: COLONOSCOPY WITH PROPOFOL;  Surgeon: [name], MD;  Location: [facility] ENDOSCOPY;  Service: Gastroenterology;  Laterality: N/A;  . MELANOMA EXCISION    . OVARIAN CYST REMOVAL     EXPLORATORY LAPAROTOMY  . TONSILLECTOMY      Prior to Admission medications   Medication Sig Start Date End Date Taking? Authorizing Provider  ascorbic Acid (VITAMIN C) 500  MG CPCR Take 500 mg by mouth daily. Patient not taking: Reported on 09/09/2019    [provider]  atorvastatin (LIPITOR) 40 MG tablet Take 1 tablet (40 mg total) by mouth 3 (three) times a week. 05/06/19   Steele Sizer, MD  B Complex Vitamins (B COMPLEX 1 PO) Take by mouth.    [provider]  Cholecalciferol (VITAMIN D-3) 125 MCG (5000 UT) TABS Take 3 tablets by mouth daily.    [provider]  Magnesium 500 MG CAPS Take by mouth.    [provider]  metoprolol tartrate (LOPRESSOR) 25 MG tablet Take 1 tablet (25 mg total) by mouth 2 (two) times daily. 07/29/19   Steele Sizer, MD  nystatin (NYSTATIN) powder Apply 1 application topically 3 (three) times daily. Patient not taking: Reported on 09/09/2019 07/30/19   Gillis Ends, MD  Sodium Sulfate-Mag Sulfate-KCl (SUTAB) [phone] MG TABS At 5 PM take 12 tablets using the 8 oz cup provided in the kit drinking 5 cups of water and 5 hours before your procedure repeat the same process. 09/22/19   [name], MD  telmisartan (MICARDIS) 40 MG tablet TAKE 1 TABLET BY MOUTH EVERY DAY 06/13/19   Steele Sizer, MD  warfarin (COUMADIN) 5 MG tablet Take 2 tablets by mouth daily. Pt takes 2 tabs (10 mg) daily and 3 tabs (15 mg) on  Wednesdays 05/23/19   [provider]    Family History  Problem Relation Age of Onset  . Cancer Mother   . Stroke Mother   . Thyroid nodules Mother   . Diabetes Sister   . Hypertension Sister   . Breast cancer Maternal Aunt   . Bone cancer Maternal Uncle   . Depression Daughter   . Depression Daughter      Social History   Tobacco Use  . Smoking status: Never Smoker  . Smokeless tobacco: Never Used  Vaping Use  . Vaping Use: Never used  Substance Use Topics  . Alcohol use: Not Currently    Alcohol/week: 0.0 standard drinks  . Drug use: No    Allergies as of 10/20/2019 - Review Complete 09/24/2019  Allergen Reaction Noted  . Ace inhibitors Other  (See Comments) 08/21/2014  . Duloxetine Other (See Comments) 08/10/2016    Review of Systems:    All systems reviewed and negative except where noted in HPI.   Physical Exam:  BP 111/72 (BP Location: Left Wrist, Patient Position: Sitting, Cuff Size: Normal)   Pulse 66   Temp 98.5 F (36.9 C) (Oral)  No LMP recorded. Patient is postmenopausal. Psych:  Alert and cooperative. Normal mood and affect. General:   Alert,  Well-developed, well-nourished, pleasant and cooperative in NAD Head:  Normocephalic and atraumatic. Eyes:  Sclera clear, no icterus.   Conjunctiva pink. Ears:  Normal auditory acuity. Nose:  No deformity, discharge, or lesions. Mouth:  No deformity or lesions,oropharynx pink & moist. Neck:  Supple; no masses or thyromegaly. Abdomen:  Normal bowel sounds.  No bruits.  Soft, non-tender and non-distended without masses, hepatosplenomegaly or hernias noted.  No guarding or rebound tenderness.    Msk:  Symmetrical without gross deformities. Good, equal movement & strength bilaterally. Pulses:  Normal pulses noted. Extremities:  No clubbing or edema.  No cyanosis. Neurologic:  Alert and oriented x3;  grossly normal neurologically. Skin:  Intact without significant lesions or rashes. No jaundice. Lymph Nodes:  No significant cervical adenopathy. Psych:  Alert and cooperative. Normal mood and affect.   Labs: CBC    Component Value Date/Time   WBC 6.3 05/06/2019 1458   RBC 4.39 05/06/2019 1458   HGB 14.3 09/24/2019 1046   HGB 13.6 11/23/2015 1004   HCT 42.0 09/24/2019 1046   HCT 40.2 11/23/2015 1004   PLT 257 05/06/2019 1458   PLT 307 11/23/2015 1004   MCV 98.4 05/06/2019 1458   MCV 97 11/23/2015 1004   MCH 32.6 05/06/2019 1458   MCHC 33.1 05/06/2019 1458   RDW 13.0 05/06/2019 1458   RDW 14.0 11/23/2015 1004   LYMPHSABS 1,405 05/06/2019 1458   LYMPHSABS 1.8 11/23/2015 1004   MONOABS 0.9 10/31/2016 1445   EOSABS 107 05/06/2019 1458   EOSABS 0.2 11/23/2015 1004     BASOSABS 50 05/06/2019 1458   BASOSABS 0.1 11/23/2015 1004   CMP     Component Value Date/Time   NA 142 09/24/2019 1046   NA 139 05/17/2015 1035   K 4.6 09/24/2019 1046   CL 108 09/24/2019 1046   CO2 26 05/06/2019 1458   GLUCOSE 110 (H) 09/24/2019 1046   BUN 12 09/24/2019 1046   BUN 21 05/17/2015 1035   CREATININE 0.60 09/24/2019 1046   CREATININE 0.84 05/06/2019 1458   CALCIUM 9.9 05/06/2019 1458   PROT 6.8 05/06/2019 1458   PROT 6.6 05/17/2015 1035   ALBUMIN 4.5 10/31/2016 1445   ALBUMIN 4.1 05/17/2015 1035  AST 20 05/06/2019 1458   ALT 24 05/06/2019 1458   ALKPHOS 71 10/31/2016 1445   BILITOT 0.6 05/06/2019 1458   BILITOT 0.4 05/17/2015 1035   GFRNONAA 72 05/06/2019 1458   GFRAA 83 05/06/2019 1458    Imaging Studies: No results found.  Assessment and Plan:   Melody Soto is a 69 y.o. y/o female has been referred for positive Cologuard  Colonoscopy indicated for the above Patient will need cardiology and warfarin clearance prior to the procedure  Patient is also reporting intermittent bright red blood per rectum and further evaluation for this needed to rule out malignancy as well.  Hemodynamically stable and does not need emergent procedure at this time  I have discussed alternative options, risks & benefits,  which include, but are not limited to, bleeding, infection, perforation,respiratory complication & drug reaction.  The patient agrees with this plan & written consent will be obtained.       Dr Melody Soto  Speech recognition software was used to dictate the above note.

## 2019-10-20 NOTE — Addendum Note (Signed)
Addended by: Wayna Chalet on: 10/20/2019 04:27 PM   Modules accepted: Orders

## 2019-10-20 NOTE — Telephone Encounter (Signed)
Faxed blood thinner clearance to Dr. Ancil Boozer office. Awaiting on her respond and then tell the patient the instructions.

## 2019-10-20 NOTE — Telephone Encounter (Signed)
Faxed cardiac clearance to Dr. Etta Quill office. Awaiting on response.

## 2019-10-21 NOTE — Patient Instructions (Signed)
Thank you for allowing the Chronic Care Management team to participate in your care.   Goals Addressed            This Visit's Progress   . Chronic Disease Management       CARE PLAN ENTRY (see longitudinal plan of care for additional care plan information)  Current Barriers:  . Chronic Disease Management support and education needs related to HTN, Dyslipidemia and Chronic Acquired Lymphedema. (Hx of Endometrial Cancer)  Case Manager Clinical Goal(s):  Over the next 120 days, patient will: . Continue to take all medications as prescribed. . Attend all medical appointments as scheduled. . Monitor blood pressure and record readings. . Continue to monitor weight and nutritional intake. . Continue to follow recommended safety measures to prevent falls and injuries.   Interventions:  . Inter-disciplinary care team collaboration (see longitudinal plan of care) . Discussed plans for ongoing care management and follow-up. Reports feeling well today. Continues to take medications as prescribed and adhering to treatment recommendations. Since the last outreach a colonoscopy was scheduled d/t a positive Cologuard. She was noted to be in A-Fib during the initial sedation and the procedure was cancelled. Denies complaints of chest discomfort or palpitations. She is aware of s/sx that require immediate medical attention. She is pending follow-up with the Cardiology and Gastroenterology team. Agreed to follow-up outreach within the next few weeks to discuss medication changes and possible care management needs.   Patient Self Care Activities:  . Self administers medications  . Attends scheduled provider appointments . Calls pharmacy for medication refills . Performs ADL's independently . Performs IADL's independently . Calls provider office for new concerns or questions   Please see past updates related to this goal by clicking on the "Past Updates" button in the selected goal         Ms.  Stepanian verbalized understanding of the information discussed during the telephonic outreach today. Declined need for a mailed/printed copy of the instructions.   The care management team will follow-up with Ms. Isbell within the next few weeks.    [facility] [phone]

## 2019-10-23 ENCOUNTER — Ambulatory Visit
Admission: RE | Admit: 2019-10-23 | Discharge: 2019-10-23 | Disposition: A | Discharge status: Home / Self Care | Payer: Medicare HMO | Source: Ambulatory Visit | Attending: Family Medicine | Admitting: Family Medicine

## 2019-10-23 DIAGNOSIS — Z1231 Encounter for screening mammogram for malignant neoplasm of breast: Secondary | ICD-10-CM | POA: Diagnosis not present

## 2019-10-27 NOTE — Telephone Encounter (Signed)
Called patient but had to leave her a detailed message letting her know that Dr. Clayborn Bigness gave Dr. Bonna Gains cardiac clearance for her to do her procedure on 11/05/2019 and that she should hold her coumadin 5 days prior and restart 2 days after her procedure. I will also send patient a MyChart message letting her know.

## 2019-10-27 NOTE — Telephone Encounter (Signed)
We received cardiac clearance and Coumadin instructions. Patient is to hold her Coumadin 5 days prior and restart 2 days after procedure. This was given to patient via voicemail and MyChart message.

## 2019-10-30 DIAGNOSIS — I480 Paroxysmal atrial fibrillation: Secondary | ICD-10-CM | POA: Diagnosis not present

## 2019-10-30 DIAGNOSIS — I10 Essential (primary) hypertension: Secondary | ICD-10-CM | POA: Diagnosis not present

## 2019-10-30 DIAGNOSIS — Z952 Presence of prosthetic heart valve: Secondary | ICD-10-CM | POA: Diagnosis not present

## 2019-11-02 ENCOUNTER — Telehealth: Payer: Self-pay

## 2019-11-02 NOTE — Telephone Encounter (Signed)
Patient called to cancel her colonoscopy with Dr. Bonna Gains. Patient states she is having some heart issues with afib again. She plans to reschedule at a later date.

## 2019-11-03 ENCOUNTER — Other Ambulatory Visit: Payer: Medicare HMO

## 2019-11-05 ENCOUNTER — Encounter: Admission: RE | Payer: Self-pay | Source: Home / Self Care

## 2019-11-05 ENCOUNTER — Ambulatory Visit: Admission: RE | Admit: 2019-11-05 | Payer: Medicare HMO | Source: Home / Self Care | Admitting: Gastroenterology

## 2019-11-05 SURGERY — COLONOSCOPY WITH PROPOFOL
Anesthesia: General

## 2019-11-06 ENCOUNTER — Other Ambulatory Visit: Payer: Self-pay

## 2019-11-06 ENCOUNTER — Ambulatory Visit: Payer: Medicare HMO | Admitting: Family Medicine

## 2019-11-06 DIAGNOSIS — Z6841 Body Mass Index (BMI) 40.0 and over, adult: Secondary | ICD-10-CM | POA: Diagnosis not present

## 2019-11-06 DIAGNOSIS — E7800 Pure hypercholesterolemia, unspecified: Secondary | ICD-10-CM | POA: Diagnosis not present

## 2019-11-06 DIAGNOSIS — Z5181 Encounter for therapeutic drug level monitoring: Secondary | ICD-10-CM | POA: Diagnosis not present

## 2019-11-06 DIAGNOSIS — I480 Paroxysmal atrial fibrillation: Secondary | ICD-10-CM | POA: Diagnosis not present

## 2019-11-06 DIAGNOSIS — I519 Heart disease, unspecified: Secondary | ICD-10-CM | POA: Diagnosis not present

## 2019-11-06 DIAGNOSIS — I4819 Other persistent atrial fibrillation: Secondary | ICD-10-CM | POA: Diagnosis not present

## 2019-11-06 DIAGNOSIS — E785 Hyperlipidemia, unspecified: Secondary | ICD-10-CM | POA: Diagnosis not present

## 2019-11-06 DIAGNOSIS — I359 Nonrheumatic aortic valve disorder, unspecified: Secondary | ICD-10-CM | POA: Diagnosis not present

## 2019-11-06 DIAGNOSIS — I4891 Unspecified atrial fibrillation: Secondary | ICD-10-CM | POA: Diagnosis not present

## 2019-11-06 DIAGNOSIS — Z7901 Long term (current) use of anticoagulants: Secondary | ICD-10-CM | POA: Diagnosis not present

## 2019-11-06 DIAGNOSIS — Z952 Presence of prosthetic heart valve: Secondary | ICD-10-CM | POA: Diagnosis not present

## 2019-11-06 DIAGNOSIS — Z79899 Other long term (current) drug therapy: Secondary | ICD-10-CM | POA: Diagnosis not present

## 2019-11-06 DIAGNOSIS — Z8542 Personal history of malignant neoplasm of other parts of uterus: Secondary | ICD-10-CM | POA: Diagnosis not present

## 2019-11-06 DIAGNOSIS — Z923 Personal history of irradiation: Secondary | ICD-10-CM | POA: Diagnosis not present

## 2019-11-06 DIAGNOSIS — I350 Nonrheumatic aortic (valve) stenosis: Secondary | ICD-10-CM | POA: Diagnosis not present

## 2019-11-06 DIAGNOSIS — I10 Essential (primary) hypertension: Secondary | ICD-10-CM | POA: Diagnosis not present

## 2019-11-09 DIAGNOSIS — I480 Paroxysmal atrial fibrillation: Secondary | ICD-10-CM | POA: Diagnosis not present

## 2019-11-09 DIAGNOSIS — I359 Nonrheumatic aortic valve disorder, unspecified: Secondary | ICD-10-CM | POA: Diagnosis not present

## 2019-11-09 DIAGNOSIS — I10 Essential (primary) hypertension: Secondary | ICD-10-CM | POA: Diagnosis not present

## 2019-11-09 NOTE — Telephone Encounter (Signed)
We were able to get cardiac clearance from Dr. Rockey Situ. However, we did not get instructions for her coumadin. So I faxed her blood thinner form and waiting on his response. Once we have a response, I will then call patient to reschedule her colonoscopy.

## 2019-11-12 DIAGNOSIS — Z952 Presence of prosthetic heart valve: Secondary | ICD-10-CM | POA: Diagnosis not present

## 2019-11-12 DIAGNOSIS — Z7901 Long term (current) use of anticoagulants: Secondary | ICD-10-CM | POA: Diagnosis not present

## 2019-11-12 DIAGNOSIS — Z5181 Encounter for therapeutic drug level monitoring: Secondary | ICD-10-CM | POA: Diagnosis not present

## 2019-11-13 DIAGNOSIS — Z5181 Encounter for therapeutic drug level monitoring: Secondary | ICD-10-CM | POA: Diagnosis not present

## 2019-11-13 DIAGNOSIS — Z952 Presence of prosthetic heart valve: Secondary | ICD-10-CM | POA: Diagnosis not present

## 2019-11-13 DIAGNOSIS — I480 Paroxysmal atrial fibrillation: Secondary | ICD-10-CM | POA: Diagnosis not present

## 2019-11-13 DIAGNOSIS — Z7901 Long term (current) use of anticoagulants: Secondary | ICD-10-CM | POA: Diagnosis not present

## 2019-11-16 ENCOUNTER — Telehealth: Payer: Self-pay

## 2019-11-16 NOTE — Telephone Encounter (Signed)
Called patient but had to leave her a voicemail letting her know that we received cardiac clearance and blood thinner approval from her cardiologist to move forward in rescheduling her colonoscopy and to call us back.

## 2019-11-17 DIAGNOSIS — I480 Paroxysmal atrial fibrillation: Secondary | ICD-10-CM | POA: Diagnosis not present

## 2019-11-17 DIAGNOSIS — I519 Heart disease, unspecified: Secondary | ICD-10-CM | POA: Diagnosis not present

## 2019-11-17 DIAGNOSIS — Z952 Presence of prosthetic heart valve: Secondary | ICD-10-CM | POA: Diagnosis not present

## 2019-11-17 DIAGNOSIS — I10 Essential (primary) hypertension: Secondary | ICD-10-CM | POA: Diagnosis not present

## 2019-11-18 NOTE — Telephone Encounter (Signed)
Called patient again and left her a voicemail to call me back and see if she would like to schedule her colonoscopy as recommended by Dr. Bonna Gains. I will send her a message through [place] asking her if she would like to scheudle her colonoscopy. Hopefully, she will answer.

## 2019-11-19 ENCOUNTER — Other Ambulatory Visit: Payer: Self-pay

## 2019-11-19 DIAGNOSIS — R195 Other fecal abnormalities: Secondary | ICD-10-CM

## 2019-11-19 MED ORDER — NA SULFATE-K SULFATE-MG SULF 17.5-3.13-1.6 GM/177ML PO SOLN: ORAL | 0 refills | Status: DC

## 2019-11-19 NOTE — Telephone Encounter (Signed)
Patient called back to schedule her colonoscopy and she agreed on getting it done on 12/10/2019 at [facility]. I will be mailing her the instructions per patient's request.

## 2019-11-23 ENCOUNTER — Ambulatory Visit: Payer: Medicare HMO

## 2019-11-23 DIAGNOSIS — I10 Essential (primary) hypertension: Secondary | ICD-10-CM

## 2019-11-23 DIAGNOSIS — I480 Paroxysmal atrial fibrillation: Secondary | ICD-10-CM

## 2019-11-23 DIAGNOSIS — E785 Hyperlipidemia, unspecified: Secondary | ICD-10-CM

## 2019-11-23 NOTE — Chronic Care Management (AMB) (Signed)
Chronic Care Management   Follow Up Note   11/23/2019 Name: Melody Soto MRN: [id number] DOB: 18-Aug-1950  Primary Care Provider: Steele Sizer, MD Reason for referral : Chronic Care Management  Melody Soto is a 69 y.o. year old female who is a primary care patient of Melody Sizer, MD. She is currently engaged with the chronic care management team. A routine outreach was conducted today.  Review of Melody Soto's status, including review of consultants reports, relevant labs and test results was conducted today. Collaboration with appropriate care team members was performed as part of the comprehensive evaluation and provision of chronic care management services.    SDOH (Social Determinants of Health) assessments performed: No    Outpatient Encounter Medications as of 11/23/2019  Medication Sig  . atorvastatin (LIPITOR) 40 MG tablet Take 1 tablet (40 mg total) by mouth 3 (three) times a week.  . B Complex Vitamins (B COMPLEX 1 PO) Take by mouth.  . Cholecalciferol (VITAMIN D-3) 125 MCG (5000 UT) TABS Take 3 tablets by mouth daily.  . Magnesium 500 MG CAPS Take by mouth.  . telmisartan (MICARDIS) 40 MG tablet TAKE 1 TABLET BY MOUTH EVERY DAY  . warfarin (COUMADIN) 5 MG tablet Take 2 tablets by mouth daily. Pt takes 2 tabs (10 mg) daily and 3 tabs (15 mg) on Wednesdays  . metoprolol tartrate (LOPRESSOR) 50 MG tablet Take 50 mg by mouth 2 (two) times daily.  . Na Sulfate-K Sulfate-Mg Sulf 17.5-3.13-1.6 GM/177ML SOLN At 5 PM the day before procedure take 1 bottle and 5 hours before procedure take 1 bottle.   No facility-administered encounter medications on file as of 11/23/2019.      Goals Addressed            This Visit's Progress   . Chronic Disease Management       CARE PLAN ENTRY (see longitudinal plan of care for additional care plan information)  Current Barriers:  . Chronic Disease Management support and education needs related to HTN, Dyslipidemia and  Chronic Acquired Lymphedema. (Hx of Endometrial Cancer)  Case Manager Clinical Goal(s):  Over the next 120 days, patient will: . Continue to take all medications as prescribed.-Complete . Attend all medical appointments as scheduled. . Monitor blood pressure and record readings.-Complete . Continue to monitor weight and nutritional intake.-Complete . Continue to follow recommended safety measures to prevent falls and injuries.-Complete   Interventions:  . Inter-disciplinary care team collaboration (see longitudinal plan of care) . Discussed plan for pending colonoscopy. Melody Soto previous colonoscopy was cancelled d/t being in A-Fib during the initial sedation. She was evaluated and cleared by the Cardiology team earlier this month. Denies chest discomfort, palpitations or symptoms r/t A-Fib. She reports regular bowel movements over the past few weeks but has noted small amounts of blood in her stool. Denies abdominal pain. Reports discussing this with the Gastroenterology team. She is aware of worsening s/sx that require immediate medical attention. Anticipates completing the colonoscopy on 12/10/19.   Marland Kitchen Discussed care management needs and plan for follow-up. She was pending a PCP visit on 12/08/19. Requested to reschedule this visit to a later date. Visit changed to 12/21/19. She continues to take medications as prescribed and doing very well with maintaining a heart healthy/modified carb diet. She remains very motivated to reach her weight loss goals. She denies urgent concerns or changes in care management needs. Will plan to complete care management follow-up within the next two months.    Patient Self  Care Activities:  . Self administers medications  . Attends scheduled provider appointments . Calls pharmacy for medication refills . Performs ADL's independently . Performs IADL's independently . Calls provider office for new concerns or questions   Please see past updates related  to this goal by clicking on the "Past Updates" button in the selected goal           PLAN A member of the care management team will follow-up with Melody Soto within the next two months.    [facility] [phone]

## 2019-12-07 NOTE — Patient Instructions (Addendum)
Thank you for allowing the Chronic Care Management team to participate in your care.   Goals Addressed            This Visit's Progress   . Chronic Disease Management       CARE PLAN ENTRY (see longitudinal plan of care for additional care plan information)  Current Barriers:  . Chronic Disease Management support and education needs related to HTN, Dyslipidemia and Chronic Acquired Lymphedema. (Hx of Endometrial Cancer)  Case Manager Clinical Goal(s):  Over the next 120 days, patient will: . Continue to take all medications as prescribed.-Complete . Attend all medical appointments as scheduled. . Monitor blood pressure and record readings.-Complete . Continue to monitor weight and nutritional intake.-Complete . Continue to follow recommended safety measures to prevent falls and injuries.-Complete   Interventions:  . Inter-disciplinary care team collaboration (see longitudinal plan of care) . Discussed plan for pending colonoscopy. Ms. Peplinski previous colonoscopy was cancelled d/t being in A-Fib during the initial sedation. She was evaluated and cleared by the Cardiology team earlier this month. Denies chest discomfort, palpitations or symptoms r/t A-Fib. She reports regular bowel movements over the past few weeks but has noted small amounts of blood in her stool. Denies abdominal pain. Reports discussing this with the Gastroenterology team. She is aware of worsening s/sx that require immediate medical attention. Anticipates completing the colonoscopy on 12/10/19.   Marland Kitchen Discussed care management needs and plan for follow-up. She was pending a PCP visit on 12/08/19. Requested to reschedule this visit to a later date. Visit changed to 12/21/19. She continues to take medications as prescribed and doing very well with maintaining a heart healthy/modified carb diet. She remains very motivated to reach her weight loss goals. She denies urgent concerns or changes in care management needs. Will  plan to complete care management follow-up within the next two months.    Patient Self Care Activities:  . Self administers medications  . Attends scheduled provider appointments . Calls pharmacy for medication refills . Performs ADL's independently . Performs IADL's independently . Calls provider office for new concerns or questions   Please see past updates related to this goal by clicking on the "Past Updates" button in the selected goal        Ms. Cuevas verbalized understanding of the information discussed during the telephonic outreach today. Declined need for a mailed/printed copy of the instructions.    A member of the care management team will follow-up with Ms. Gattuso within the next two months.     [facility] [phone]

## 2019-12-08 ENCOUNTER — Ambulatory Visit: Payer: Medicare HMO | Admitting: Family Medicine

## 2019-12-08 ENCOUNTER — Other Ambulatory Visit
Admission: RE | Admit: 2019-12-08 | Discharge: 2019-12-08 | Disposition: A | Discharge status: Home / Self Care | Payer: Medicare HMO | Source: Ambulatory Visit | Attending: Gastroenterology | Admitting: Gastroenterology

## 2019-12-08 DIAGNOSIS — Z20822 Contact with and (suspected) exposure to covid-19: Secondary | ICD-10-CM | POA: Diagnosis not present

## 2019-12-08 DIAGNOSIS — Z01812 Encounter for preprocedural laboratory examination: Secondary | ICD-10-CM | POA: Insufficient documentation

## 2019-12-08 LAB — SARS CORONAVIRUS 2 (TAT 6-24 HRS): SARS Coronavirus 2: NEGATIVE

## 2019-12-10 ENCOUNTER — Ambulatory Visit: Payer: Medicare HMO | Admitting: Certified Registered"

## 2019-12-10 ENCOUNTER — Encounter: Payer: Self-pay | Admitting: Gastroenterology

## 2019-12-10 ENCOUNTER — Ambulatory Visit
Admission: RE | Admit: 2019-12-10 | Discharge: 2019-12-10 | Disposition: A | Discharge status: Home / Self Care | Payer: Medicare HMO | Attending: Gastroenterology | Admitting: Gastroenterology

## 2019-12-10 ENCOUNTER — Encounter
Admission: RE | Disposition: A | Discharge status: Home / Self Care | Payer: Self-pay | Source: Home / Self Care | Attending: Gastroenterology

## 2019-12-10 DIAGNOSIS — D122 Benign neoplasm of ascending colon: Secondary | ICD-10-CM | POA: Diagnosis not present

## 2019-12-10 DIAGNOSIS — D125 Benign neoplasm of sigmoid colon: Secondary | ICD-10-CM | POA: Insufficient documentation

## 2019-12-10 DIAGNOSIS — K5790 Diverticulosis of intestine, part unspecified, without perforation or abscess without bleeding: Secondary | ICD-10-CM | POA: Diagnosis not present

## 2019-12-10 DIAGNOSIS — K635 Polyp of colon: Secondary | ICD-10-CM | POA: Diagnosis not present

## 2019-12-10 DIAGNOSIS — K6289 Other specified diseases of anus and rectum: Secondary | ICD-10-CM | POA: Diagnosis not present

## 2019-12-10 DIAGNOSIS — D120 Benign neoplasm of cecum: Secondary | ICD-10-CM | POA: Diagnosis not present

## 2019-12-10 DIAGNOSIS — Z888 Allergy status to other drugs, medicaments and biological substances status: Secondary | ICD-10-CM | POA: Diagnosis not present

## 2019-12-10 DIAGNOSIS — R195 Other fecal abnormalities: Secondary | ICD-10-CM

## 2019-12-10 DIAGNOSIS — D123 Benign neoplasm of transverse colon: Secondary | ICD-10-CM | POA: Diagnosis not present

## 2019-12-10 DIAGNOSIS — K5520 Angiodysplasia of colon without hemorrhage: Secondary | ICD-10-CM | POA: Insufficient documentation

## 2019-12-10 HISTORY — PX: COLONOSCOPY WITH PROPOFOL: SHX5780

## 2019-12-10 SURGERY — COLONOSCOPY WITH PROPOFOL
Anesthesia: General

## 2019-12-10 MED ORDER — SODIUM CHLORIDE 0.9 % IV SOLN: INTRAVENOUS | Status: DC | PRN

## 2019-12-10 MED ORDER — SODIUM CHLORIDE 0.9 % IV SOLN
INTRAVENOUS | Status: DC
  Administered 2019-12-10: 1000 mL via INTRAVENOUS

## 2019-12-10 MED ORDER — PROPOFOL 500 MG/50ML IV EMUL
INTRAVENOUS | Status: DC | PRN
  Administered 2019-12-10: 165 ug/kg/min via INTRAVENOUS

## 2019-12-10 MED ORDER — PROPOFOL 10 MG/ML IV BOLUS
INTRAVENOUS | Status: DC | PRN
  Administered 2019-12-10: 50 mg via INTRAVENOUS
  Administered 2019-12-10 (×3): 10 mg via INTRAVENOUS

## 2019-12-10 MED ORDER — LIDOCAINE HCL (CARDIAC) PF 100 MG/5ML IV SOSY
PREFILLED_SYRINGE | INTRAVENOUS | Status: DC | PRN
  Administered 2019-12-10: 100 mg via INTRAVENOUS

## 2019-12-10 MED ORDER — PROPOFOL 500 MG/50ML IV EMUL
INTRAVENOUS | Status: AC
  Filled 2019-12-10: qty 50

## 2019-12-10 MED ORDER — GLYCOPYRROLATE 0.2 MG/ML IJ SOLN
INTRAMUSCULAR | Status: DC | PRN
  Administered 2019-12-10: .2 mg via INTRAVENOUS

## 2019-12-10 NOTE — Op Note (Signed)
[facility] Gastroenterology Patient Name: Melody Soto Procedure Date: 12/10/2019 8:53 AM MRN: [id number] Account #: [id number] Date of Birth: [birth year] Admit Type: Outpatient Age: 69 Room: [facility] ENDO ROOM 3 Gender: Female Note Status: Finalized Procedure:             Colonoscopy Indications:           Positive Cologuard test Providers:             Turner Kunzman B. Bonna Gains MD, MD Referring MD:          Bethena Roys. Sowles, MD (Referring MD) Medicines:             Monitored Anesthesia Care Complications:         No immediate complications. Procedure:             Pre-Anesthesia Assessment:                        - ASA Grade Assessment: II - A patient with mild                         systemic disease.                        - Prior to the procedure, a History and Physical was                         performed, and patient medications, allergies and                         sensitivities were reviewed. The patient's tolerance                         of previous anesthesia was reviewed.                        - The risks and benefits of the procedure and the                         sedation options and risks were discussed with the                         patient. All questions were answered and informed                         consent was obtained.                        - Patient identification and proposed procedure were                         verified prior to the procedure by the physician, the                         nurse, the anesthesiologist, the anesthetist and the                         technician. The procedure was verified in the                         procedure room.  After obtaining informed consent, the colonoscope was                         passed under direct vision. Throughout the procedure,                         the patient's blood pressure, pulse, and oxygen                         saturations were monitored  continuously. The                         Colonoscope was introduced through the anus and                         advanced to the the cecum, identified by appendiceal                         orifice and ileocecal valve. The colonoscopy was                         performed with ease. The patient tolerated the                         procedure well. The quality of the bowel preparation                         was good. Findings:      The perianal and digital rectal examinations were normal.      Two flat and sessile polyps were found in the cecum. The polyps were 4       to 7 mm in size. These polyps were removed with a cold snare. Resection       and retrieval were complete.      Four sessile polyps were found in the transverse colon and ascending       colon. The polyps were 4 to 6 mm in size. These polyps were removed with       a cold snare. Resection and retrieval were complete.      A 4 mm polyp was found in the sigmoid colon. The polyp was sessile. The       polyp was removed with a jumbo cold forceps. Resection and retrieval       were complete.      A patchy area of granular mucosa was found in the sigmoid colon.       Biopsies were taken with a cold forceps for histology.      A few small angioectasias without bleeding were found in the sigmoid       colon and in the cecum.      The exam was otherwise without abnormality.      The rectum, sigmoid colon, descending colon, transverse colon, ascending       colon and cecum appeared normal.      Anal papilla(e) were hypertrophied.      No additional abnormalities were found on retroflexion.      PLEASE NOTE: Due to provation error, several images captured during the       procedure did not save Impression:            - Two 4 to 7  mm polyps in the cecum, removed with a                         cold snare. Resected and retrieved.                        - Four 4 to 6 mm polyps in the transverse colon and in                          the ascending colon, removed with a cold snare.                         Resected and retrieved.                        - One 4 mm polyp in the sigmoid colon, removed with a                         jumbo cold forceps. Resected and retrieved.                        - Granularity in the sigmoid colon. Biopsied.                        - A few non-bleeding colonic angioectasias.                        - The examination was otherwise normal.                        - The rectum, sigmoid colon, descending colon,                         transverse colon, ascending colon and cecum are normal.                        - Anal papilla(e) were hypertrophied. Recommendation:        - Refer to surgery as one of the anal papillae was                         larger than usual, to evaluate the need for biopsy to                         confirm if this is a benign lesion.                        - Discharge patient to home (with escort).                        - Advance diet as tolerated.                        - Continue present medications.                        - Await pathology results.                        - Repeat colonoscopy date to  be determined after                         pending pathology results are reviewed.                        - The findings and recommendations were discussed with                         the patient.                        - The findings and recommendations were discussed with                         the patient's family.                        - Return to primary care physician as previously                         scheduled. Procedure Code(s):     --- Professional ---                        [phone], Colonoscopy, flexible; with removal of                         tumor(s), polyp(s), or other lesion(s) by snare                         technique                        45380, 95, Colonoscopy, flexible; with biopsy, single                         or multiple Diagnosis Code(s):      --- Professional ---                        K63.5, Polyp of colon                        R19.5, Other fecal abnormalities                        K63.89, Other specified diseases of intestine                        K55.20, Angiodysplasia of colon without hemorrhage CPT copyright 2019 American Medical Association. All rights reserved. The codes documented in this report are preliminary and upon coder review may  be revised to meet current compliance requirements.  Vonda Antigua, MD Margretta Sidle B. Bonna Gains MD, MD 12/10/2019 11:17:41 AM This report has been signed electronically. Number of Addenda: 0 Note Initiated On: 12/10/2019 8:53 AM Scope Withdrawal Time: 0 hours 33 minutes 9 seconds  Total Procedure Duration: 0 hours 38 minutes 21 seconds  Estimated Blood Loss:  Estimated blood loss: none.      [facility]

## 2019-12-10 NOTE — Anesthesia Preprocedure Evaluation (Addendum)
Anesthesia Evaluation  Patient identified by MRN, date of birth, ID band Patient awake    Reviewed: Allergy & Precautions, NPO status , Patient's Chart, lab work & pertinent test results  History of Anesthesia Complications Negative for: history of anesthetic complications  Airway Mallampati: II  TM Distance: >3 FB Neck ROM: Full    Dental no notable dental hx. (+) Teeth Intact   Pulmonary neg pulmonary ROS, neg sleep apnea, neg COPD, Patient abstained from smoking.Not current smoker,    Pulmonary exam normal breath sounds clear to auscultation       Cardiovascular Exercise Tolerance: Good METShypertension, (-) CAD and (-) Past MI negative cardio ROS  + dysrhythmias Atrial Fibrillation  Rhythm:Regular Rate:Normal - Systolic murmurs S/p AVR in 2019  TEE 10/2019: 1. No LAA thrombus  2. Moderate LV dysfunction (40%) 3. Well-seated TAVR, good mobility. Trivial paravalvular AR  4. Mild MR     Neuro/Psych PSYCHIATRIC DISORDERS Depression negative neurological ROS  negative psych ROS   GI/Hepatic neg GERD  ,(+)     (-) substance abuse  ,   Endo/Other  neg diabetes  Renal/GU negative Renal ROS     Musculoskeletal   Abdominal   Peds  Hematology   Anesthesia Other Findings Past Medical History: No date: Aortic stenosis No date: Cataract No date: Depression No date: Dysrhythmia 10/29/2016: Endometrial cancer determined by uterine biopsy ([place])     Comment:  Rad tx's and internal brachytherapy. No date: Heart murmur     Comment:  HX OF No date: Hyperlipidemia No date: Hypertension     Comment:  CONTROLLED ON MEDS No date: Lymphedema No date: Shortness of breath dyspnea  Reproductive/Obstetrics                            Anesthesia Physical Anesthesia Plan  ASA: II  Anesthesia Plan: General   Post-op Pain Management:    Induction: Intravenous  PONV Risk Score and Plan: 3  and Ondansetron, Propofol infusion and TIVA  Airway Management Planned: Nasal Cannula  Additional Equipment: None  Intra-op Plan:   Post-operative Plan:   Informed Consent: I have reviewed the patients History and Physical, chart, labs and discussed the procedure including the risks, benefits and alternatives for the proposed anesthesia with the patient or authorized representative who has indicated his/her understanding and acceptance.     Dental advisory given  Plan Discussed with: CRNA and Surgeon  Anesthesia Plan Comments: (Discussed risks of anesthesia with patient, including possibility of difficulty with spontaneous ventilation under anesthesia necessitating airway intervention, PONV, and rare risks such as cardiac or respiratory or neurological events. Patient understands.)        Anesthesia Quick Evaluation

## 2019-12-10 NOTE — H&P (Signed)
Vonda Antigua, MD [address], [place], [place], [place], [id number], [place], [place], [place], [address] Phone: [phone]  Fax: [phone]  Primary Care Physician:  Steele Sizer, MD   Pre-Procedure History & Physical: HPI:  Melody Soto is a 69 y.o. female is here for a colonoscopy and EGD.   Past Medical History:  Diagnosis Date  . Aortic stenosis   . Cataract   . Depression   . Dysrhythmia   . Endometrial cancer determined by uterine biopsy ([facility]) 10/29/2016   Rad tx's and internal brachytherapy.  Marland Kitchen Heart murmur    HX OF  . Hyperlipidemia   . Hypertension    CONTROLLED ON MEDS  . Lymphedema   . Shortness of breath dyspnea     Past Surgical History:  Procedure Laterality Date  . 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 cataract surgery Bilateral 05/24/2014   second eye 06/07/2014  . COLONOSCOPY WITH PROPOFOL N/A 09/24/2019   Procedure: COLONOSCOPY WITH PROPOFOL;  Surgeon: [name], MD;  Location: [facility] ENDOSCOPY;  Service: Gastroenterology;  Laterality: N/A;  . MELANOMA EXCISION    . OVARIAN CYST REMOVAL     EXPLORATORY LAPAROTOMY  . TONSILLECTOMY      Prior to Admission medications   Medication Sig Start Date End Date Taking? Authorizing Provider  atorvastatin (LIPITOR) 40 MG tablet Take 1 tablet (40 mg total) by mouth 3 (three) times a week. 05/06/19   Steele Sizer, MD  B Complex Vitamins (B COMPLEX 1 PO) Take by mouth.    [provider]  Cholecalciferol (VITAMIN D-3) 125 MCG (5000 UT) TABS Take 3 tablets by mouth daily.    [provider]  Magnesium 500 MG CAPS Take by mouth.    [provider]  metoprolol tartrate (LOPRESSOR) 50 MG tablet Take 50 mg by mouth 2 (two) times daily. 10/21/19   [provider]  Na Sulfate-K Sulfate-Mg Sulf 17.5-3.13-1.6 GM/177ML SOLN At 5 PM the day before procedure take 1 bottle and 5 hours before procedure take 1 bottle. 11/19/19   [name], MD  telmisartan (MICARDIS) 40 MG tablet TAKE 1 TABLET BY MOUTH EVERY DAY 06/13/19   Steele Sizer, MD  warfarin (COUMADIN) 5 MG tablet Take 2 tablets by mouth daily. Pt takes 2 tabs (10 mg) daily and 3 tabs (15 mg) on Wednesdays 05/23/19   [provider]    Allergies as of 11/20/2019 - Review Complete 09/24/2019  Allergen Reaction Noted  . Ace inhibitors Other (See Comments) 08/21/2014  . Duloxetine Other (See Comments) 08/10/2016    Family History  Problem Relation Age of Onset  . Cancer Mother   . Stroke Mother   . Thyroid nodules Mother   . Diabetes Sister   . Hypertension Sister   . Breast cancer Maternal Aunt   . Bone cancer Maternal Uncle   . Depression Daughter   . Depression Daughter     Social History   Socioeconomic History  . Marital status: Divorced    Spouse name: Not on file  . Number of children: 2  . Years of education: Not on file  . Highest education level: Bachelor's degree (e.g., BA, AB, BS)  Occupational History  . Occupation: caregiver    Employer: [employer]    Comment: caregiver  Tobacco Use  . Smoking status: Never Smoker  . Smokeless tobacco: Never Used  Vaping Use  . Vaping Use: Never used  Substance and Sexual Activity  .  Alcohol use: Not Currently    Alcohol/week: 0.0 standard drinks  . Drug use: No  . Sexual activity: Not Currently    Birth control/protection: Post-menopausal  Other Topics Concern  . Not on file  Social History Narrative   She is divorced   She has two daughters   The youngest is not currently talking to her since 07/2018. She got pregnant without being married and had the baby in October.    Social Determinants of Health   Financial Resource Strain: Medium Risk  . Difficulty of Paying Living Expenses: Somewhat hard  Food Insecurity: No Food Insecurity  . Worried About Charity fundraiser in the Last Year: Never true  . Ran Out of Food in the Last Year: Never true    Transportation Needs: No Transportation Needs  . Lack of Transportation (Medical): No  . Lack of Transportation (Non-Medical): No  Physical Activity: Inactive  . Days of Exercise per Week: 0 days  . Minutes of Exercise per Session: 0 min  Stress: No Stress Concern Present  . Feeling of Stress : Only a little  Social Connections: Moderately Integrated  . Frequency of Communication with Friends and Family: More than three times a week  . Frequency of Social Gatherings with Friends and Family: Twice a week  . Attends Religious Services: More than 4 times per year  . Active Member of Clubs or Organizations: Yes  . Attends Archivist Meetings: More than 4 times per year  . Marital Status: Divorced  Human resources officer Violence: Not At Risk  . Fear of Current or Ex-Partner: No  . Emotionally Abused: No  . Physically Abused: No  . Sexually Abused: No    Review of Systems: See HPI, otherwise negative ROS  Physical Exam: There were no vitals taken for this visit. General:   Alert,  pleasant and cooperative in NAD Head:  Normocephalic and atraumatic. Neck:  Supple; no masses or thyromegaly. Lungs:  Clear throughout to auscultation, normal respiratory effort.    Heart:  +S1, +S2, Regular rate and rhythm, No edema. Abdomen:  Soft, nontender and nondistended. Normal bowel sounds, without guarding, and without rebound.   Neurologic:  Alert and  oriented x4;  grossly normal neurologically.  Impression/Plan: Melody Soto is here for a colonoscopy to be performed for positive cologuard.  Risks, benefits, limitations, and alternatives regarding the procedures have been reviewed with the patient.  Questions have been answered.  All parties agreeable.   [name], MD  12/10/2019, 8:47 AM

## 2019-12-10 NOTE — Anesthesia Postprocedure Evaluation (Signed)
Anesthesia Post Note  Patient: Melody Soto  Procedure(s) Performed: COLONOSCOPY WITH PROPOFOL (N/A )  Patient location during evaluation: Endoscopy Anesthesia Type: General Level of consciousness: awake and alert Pain management: pain level controlled Vital Signs Assessment: post-procedure vital signs reviewed and stable Respiratory status: spontaneous breathing, nonlabored ventilation, respiratory function stable and patient connected to nasal cannula oxygen Cardiovascular status: blood pressure returned to baseline and stable Postop Assessment: no apparent nausea or vomiting Anesthetic complications: no   No complications documented.   Last Vitals:  Vitals:   12/10/19 1135 12/10/19 1145  BP: (!) 131/56 (!) 134/59  Pulse: 69 65  Resp: 15 14  Temp:    SpO2: 99% 98%    Last Pain:  Vitals:   12/10/19 1145  TempSrc:   PainSc: 0-No pain                 Arita Miss

## 2019-12-10 NOTE — Transfer of Care (Signed)
Immediate Anesthesia Transfer of Care Note  Patient: Melody Soto  Procedure(s) Performed: COLONOSCOPY WITH PROPOFOL (N/A )  Patient Location: Endoscopy Unit  Anesthesia Type:General  Level of Consciousness: awake, drowsy and patient cooperative  Airway & Oxygen Therapy: Patient Spontanous Breathing  Post-op Assessment: Report given to RN and Post -op Vital signs reviewed and stable  Post vital signs: Reviewed and stable  Last Vitals:  Vitals Value Taken Time  BP 102/54 12/10/19 1115  Temp    Pulse 81 12/10/19 1116  Resp 16 12/10/19 1116  SpO2 100 % 12/10/19 1116  Vitals shown include unvalidated device data.  Last Pain:  Vitals:   12/10/19 0855  TempSrc: Temporal  PainSc: 0-No pain         Complications: No complications documented.

## 2019-12-10 NOTE — Anesthesia Procedure Notes (Signed)
Procedure Name: General with mask airway °Performed by: [place], Lorris Carducci, CRNA °Pre-anesthesia Checklist: Patient identified, Emergency Drugs available, Patient being monitored and Suction available °Patient Re-evaluated:Patient Re-evaluated prior to induction °Oxygen Delivery Method: Simple face mask °Induction Type: IV induction °Placement Confirmation: positive ETCO2 and CO2 detector °Dental Injury: Teeth and Oropharynx as per pre-operative assessment  ° ° ° ° ° ° °

## 2019-12-11 ENCOUNTER — Other Ambulatory Visit: Payer: Self-pay | Admitting: Family Medicine

## 2019-12-11 ENCOUNTER — Encounter: Payer: Self-pay | Admitting: Gastroenterology

## 2019-12-11 DIAGNOSIS — E785 Hyperlipidemia, unspecified: Secondary | ICD-10-CM

## 2019-12-11 LAB — SURGICAL PATHOLOGY

## 2019-12-11 NOTE — Telephone Encounter (Signed)
Requested medication (s) are due for refill today: yes   Requested medication (s) are on the active medication list: yes   Last refill: 09/25/2019  Future visit scheduled: yes  Notes to clinic: Verify Qty    Requested Prescriptions  Pending Prescriptions Disp Refills   atorvastatin (LIPITOR) 40 MG tablet [Pharmacy Med Name: ATORVASTATIN 40 MG TABLET] 36 tablet 0    Sig: Take 1 tablet (40 mg total) by mouth 3 (three) times a week.      Cardiovascular:  Antilipid - Statins Failed - 12/11/2019  1:33 PM      Failed - Total Cholesterol in normal range and within 360 days    Cholesterol, Total  Date Value Ref Range Status  05/17/2015 203 (H) 100 - 199 mg/dL Final   Cholesterol  Date Value Ref Range Status  08/08/2018 205 (H) <200 mg/dL Final          Failed - LDL in normal range and within 360 days    LDL Cholesterol (Calc)  Date Value Ref Range Status  08/08/2018 125 (H) mg/dL (calc) Final    Comment:    Reference range: <100 . Desirable range <100 mg/dL for primary prevention;   <70 mg/dL for patients with CHD or diabetic patients  with > or = 2 CHD risk factors. Marland Kitchen LDL-C is now calculated using the Martin-Hopkins  calculation, which is a validated novel method providing  better accuracy than the Friedewald equation in the  estimation of LDL-C.  Cresenciano Genre et al. Annamaria Helling. 7371;062(69): 2061-2068  (http://education.[facility].com/faq/FAQ164)           Failed - HDL in normal range and within 360 days    HDL  Date Value Ref Range Status  08/08/2018 48 (L) > OR = 50 mg/dL Final  05/17/2015 47 >39 mg/dL Final          Failed - Triglycerides in normal range and within 360 days    Triglycerides  Date Value Ref Range Status  08/08/2018 200 (H) <150 mg/dL Final    Comment:    . If a non-fasting specimen was collected, consider repeat triglyceride testing on a fasting specimen if clinically indicated.  Yates Decamp et al. J. of Clin. Lipidol. 4854;6:270-350. Marland Kitchen            Passed - Patient is not pregnant      Passed - Valid encounter within last 12 months    Recent Outpatient Visits           7 months ago Obesity, morbid, BMI 40.0-49.9 ([place])   [facility] Steele Sizer, MD   1 year ago Mild major depression [facility])   [facility] Steele Sizer, MD   1 year ago Major depression in remission [facility])   [facility] Steele Sizer, MD   2 years ago [facility] visit   [facility] Steele Sizer, MD   2 years ago Major depression in remission [facility])   [facility] Steele Sizer, MD       Future Appointments             In 1 week Steele Sizer, MD [facility], [place]   In 8 months  [facility], [facility]

## 2019-12-14 ENCOUNTER — Other Ambulatory Visit: Payer: Self-pay

## 2019-12-14 ENCOUNTER — Encounter: Payer: Self-pay | Admitting: Gastroenterology

## 2019-12-14 ENCOUNTER — Telehealth: Payer: Self-pay

## 2019-12-14 DIAGNOSIS — K6289 Other specified diseases of anus and rectum: Secondary | ICD-10-CM

## 2019-12-14 NOTE — Telephone Encounter (Signed)
Called patient to let her know that Dr. Bonna Gains is recommending for her to be seen by a surgeon for an enlarged anal papillae and she would like further evaluation to know if it is benign or not. Patient agreed with referral. Referral was sent out.

## 2019-12-14 NOTE — Progress Notes (Signed)
referral

## 2019-12-14 NOTE — Telephone Encounter (Signed)
-----   Message from [name], MD sent at 12/11/2019 11:17 AM EDT ----- See procedure report and letter drafted. Send referral to surgery as recommended in procedure report ----- Message ----- From: Interface, Lab In [facility] Sent: 12/11/2019   9:02 AM EDT To: [name], MD

## 2019-12-21 ENCOUNTER — Ambulatory Visit (INDEPENDENT_AMBULATORY_CARE_PROVIDER_SITE_OTHER): Payer: Medicare HMO | Admitting: Family Medicine

## 2019-12-21 ENCOUNTER — Other Ambulatory Visit: Payer: Self-pay

## 2019-12-21 ENCOUNTER — Encounter: Payer: Self-pay | Admitting: Family Medicine

## 2019-12-21 VITALS — BP 120/68 | HR 97 | Temp 97.9°F | Resp 17 | Ht 68.0 in | Wt 255.2 lb

## 2019-12-21 DIAGNOSIS — I890 Lymphedema, not elsewhere classified: Secondary | ICD-10-CM | POA: Diagnosis not present

## 2019-12-21 DIAGNOSIS — I10 Essential (primary) hypertension: Secondary | ICD-10-CM | POA: Diagnosis not present

## 2019-12-21 DIAGNOSIS — E8881 Metabolic syndrome: Secondary | ICD-10-CM

## 2019-12-21 DIAGNOSIS — I480 Paroxysmal atrial fibrillation: Secondary | ICD-10-CM | POA: Diagnosis not present

## 2019-12-21 DIAGNOSIS — R739 Hyperglycemia, unspecified: Secondary | ICD-10-CM

## 2019-12-21 DIAGNOSIS — Z8542 Personal history of malignant neoplasm of other parts of uterus: Secondary | ICD-10-CM | POA: Diagnosis not present

## 2019-12-21 DIAGNOSIS — Z952 Presence of prosthetic heart valve: Secondary | ICD-10-CM

## 2019-12-21 DIAGNOSIS — E785 Hyperlipidemia, unspecified: Secondary | ICD-10-CM

## 2019-12-21 DIAGNOSIS — I5040 Unspecified combined systolic (congestive) and diastolic (congestive) heart failure: Secondary | ICD-10-CM

## 2019-12-21 MED ORDER — TELMISARTAN 40 MG PO TABS: 40.0000 mg | ORAL_TABLET | Freq: Every day | ORAL | 1 refills | Status: DC

## 2019-12-21 NOTE — Progress Notes (Signed)
Name: Melody Soto   MRN: [id number]    DOB: May 17, 1950   Date:12/21/2019       Progress Note  Subjective  Chief Complaint  Follow up   HPI    History of endometrial cancer:  had spotting in Feb, Dr. Baruch Gouty ordered a CT, discussed results including diverticulosis and cholelithiasis, including symptoms of acute cholecystitis and acute diverticulitis. She is up to date with follow ups  HTN:she is taking Micardis, Toprol XL and denies side effects. No chest pain or palpitation   Morbid obesity: she went on Keto diet since she saw her A1C was 6.1 % back in March, she has lost 40 lbs since March 2021 with dietary modification    Metabolic syndrome:last [id number] was up to 6.1 % Denies polyphagia, polyuria or polydipsia.  Lymphedema: she responded well to PT ,she has been wearing compression stocking hoses, she only wears pumps occasionally . Stable   S/p TAVR: she had surgery done at [facility] on 02/27/2017 for severe aortic stenosis. She is on Toprol XL No palpitation, chest pain or orthopnea. Reviewed last echo, she developed thrombus of aorta and is taking coumadin  , last echo did not show a thrombus   Last Echo 09/2019 at [facility] ---------------------------------------------------------------  SEVERE LV DYSFUNCTION (See above)  MILD RV SYSTOLIC DYSFUNCTION (See above)  VALVULAR REGURGITATION: MILD AR, MILD MR, TRIVIAL PR, MILD TR  DILATED ASCENDING AORTA 3.8 cm.  S/P TAVR WITH MILD-MODERATE PARAVALVULAR AORTIC REGURGITATION, UNCHANGED  FROM PRIOR.     Echo was 12/2018  INTERPRETATION ---------------------------------------------------------------   NORMAL LEFT VENTRICULAR SYSTOLIC FUNCTION WITH MILD LVH   NORMAL RIGHT VENTRICULAR SYSTOLIC FUNCTION   VALVULAR REGURGITATION: MILD AR, TRIVIAL MR, TRIVIAL PR   S/P TAVR WITH MILD-TO-MODERATE PARAVALVULAR REGURGITATION   ASCENDING AO FLOW TURBULENCE ( PEAK VEL. 2.7 M/S)  Transesophageal echo done  at [facility] 11/09/2019  CONCLUSIONS ------------------------------------------------------------------  1. No LAA thrombus  2. Moderate LV dysfunction  3. Well-seated TAVR, good mobility. Trivial paravalvular AR  4. Mild MR     Depression: she took Celexa many years ago, she states she was very worried waiting for colonoscopy due to positive cologuard , but now that she had a colonoscopy she feels better. She is still unable to see her grand-daughter. She went to visit her daughter but her boyfriend told her she was still not ready to see her. .   Urinary incontinence: she was seen by Urologist , tried medication but did not improve symptoms, so she stopped taking it, she continues to have to wear a pad  Atrial fibrillation: found during colonoscopy this Summer, procedure was scheuduled for 09/09/2019 but was cancelled due to Afib. She is now going to cardiologist at [facility]. She is now on Toprol , Spirolactone, Jardiance and Tikosyn since the Summer, rhythm has been controlled, she is going to [facility] for INR checks. She denies sob, palpitation . They tried cardioversion twice but only stayed on heart rhythm for less than two weeks. During episode of afib EF was low and she is on medications for CHF getting echo repeated in January.    Patient Active Problem List   Diagnosis Date Noted  . Positive colorectal cancer screening using Cologuard test   . Cecal polyp   . Polyp of sigmoid colon   . Paroxysmal atrial fibrillation ([place]) 10/02/2019  . Thoracic aortic ectasia ([place]) 08/31/2019  . Monitoring for anticoagulant use 01/06/2019  . Diverticulosis of colon 08/08/2018  . Cholelithiasis 08/08/2018  .  Endometrial cancer ([place]) 04/10/2017  . 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 Chronic acquired lymphedema 01/08/2017  . Obesity, morbid, BMI 40.0-49.9 ([place]) 11/13/2016  . FIGO stage II endometrial cancer ([place]) 10/29/2016  . Left ventricular hypertrophy  11/27/2014  . Lymphedema of lower extremity 08/23/2014  . Severe aortic stenosis 08/21/2014  . Benign essential HTN 08/21/2014  . Depression, major, recurrent, mild ([place]) 08/21/2014  . Dyslipidemia 08/21/2014  . Cardiac dysrhythmia 08/21/2014  . Dysmetabolic syndrome 20/25/4270  . Symptomatic menopausal or female climacteric states 08/21/2014  . 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 History of shingles 08/21/2014  . Personal history of skin cancer 11/24/2009    Past Surgical History:  Procedure Laterality Date  . 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 CARDIAC CATHETERIZATION    . cataract surgery Bilateral 05/24/2014   second eye 06/07/2014  . COLONOSCOPY WITH PROPOFOL N/A 09/24/2019   Procedure: COLONOSCOPY WITH PROPOFOL;  Surgeon: [name], MD;  Location: [facility] ENDOSCOPY;  Service: Gastroenterology;  Laterality: N/A;  . COLONOSCOPY WITH PROPOFOL N/A 12/10/2019   Procedure: COLONOSCOPY WITH PROPOFOL;  Surgeon: [name], MD;  Location: [facility] ENDOSCOPY;  Service: Endoscopy;  Laterality: N/A;  . CORONARY ARTERY BYPASS GRAFT    . EYE SURGERY    . MELANOMA EXCISION    . OVARIAN CYST REMOVAL     EXPLORATORY LAPAROTOMY  . TONSILLECTOMY      Family History  Problem Relation Age of Onset  . Cancer Mother   . Stroke Mother   . Thyroid nodules Mother   . Diabetes Sister   . Hypertension Sister   . Breast cancer Maternal Aunt   . Bone cancer Maternal Uncle   . Depression Daughter   . Depression Daughter     Social History   Tobacco Use  . Smoking status: Never Smoker  . Smokeless tobacco: Never Used  Substance Use Topics  . Alcohol use: Not Currently    Alcohol/week: 0.0 standard drinks     Current Outpatient Medications:  .  atorvastatin (LIPITOR) 40 MG tablet, Take 1 tablet (40 mg total) by mouth 3 (three) times a week., Disp: 36 tablet, Rfl: 0 .  B Complex Vitamins (B COMPLEX 1 PO), Take by mouth., Disp: , Rfl:  .   Cholecalciferol (VITAMIN D-3) 125 MCG (5000 UT) TABS, Take 3 tablets by mouth daily., Disp: , Rfl:  .  dofetilide (TIKOSYN) 125 MCG capsule, Take by mouth., Disp: , Rfl:  .  JARDIANCE 10 MG TABS tablet, Take 10 mg by mouth daily., Disp: , Rfl:  .  Magnesium 500 MG CAPS, Take 800 mg by mouth. , Disp: , Rfl:  .  metoprolol succinate (TOPROL-XL) 50 MG 24 hr tablet, Take 50 mg by mouth 2 (two) times daily., Disp: , Rfl:  .  spironolactone (ALDACTONE) 25 MG tablet, Take 25 mg by mouth 2 (two) times daily., Disp: , Rfl:  .  telmisartan (MICARDIS) 40 MG tablet, TAKE 1 TABLET BY MOUTH EVERY DAY, Disp: 90 tablet, Rfl: 1 .  vitamin C (ASCORBIC ACID) 250 MG tablet, Take 500 mg by mouth daily., Disp: , Rfl:  .  warfarin (COUMADIN) 5 MG tablet, Take 2 tablets by mouth daily. Pt takes 2 tabs (10 mg) daily and 3 tabs (15 mg) on Wednesdays, Disp: , Rfl:   Allergies  Allergen Reactions  . Ace Inhibitors Other (See Comments)    UNKNOWN REACTION  . Duloxetine Other (See Comments)    Drowsiness  I personally reviewed active problem list, medication list, allergies, family history, social history, health maintenance with the patient/caregiver today.   ROS  Constitutional: Negative for fever , positive for 40 lbs  weight change.  Respiratory: Negative for cough and shortness of breath.   Cardiovascular: Negative for chest pain or palpitations.  Gastrointestinal: Negative for abdominal pain, no bowel changes.  Musculoskeletal: Negative for gait problem or joint swelling.  Skin: Negative for rash.  Neurological: Negative for dizziness or headache.  No other specific complaints in a complete review of systems (except as listed in HPI above).  Objective  Vitals:   12/21/19 1350  BP: 120/68  Pulse: 97  Resp: 17  Temp: 97.9 F (36.6 C)  TempSrc: Oral  SpO2: 99%  Weight: 255 lb 3.2 oz (115.8 kg)  Height: 5\' 8"  (1.727 m)    Body mass index is 38.8 kg/m.  Physical Exam  Constitutional: Patient  appears well-developed and well-nourished. Obese  No distress.  HEENT: head atraumatic, normocephalic, pupils equal and reactive to light, neck supple Cardiovascular: Normal rate, regular rhythm and normal heart sounds.  No murmur heard. Positive  BLE edema - lymphedema  Pulmonary/Chest: Effort normal and breath sounds normal. No respiratory distress. Abdominal: Soft.  There is no tenderness. Psychiatric: Patient has a normal mood and affect. behavior is normal. Judgment and thought content normal.  Recent Results (from the past 2160 hour(s))  I-STAT, chem 8     Status: Abnormal   Collection Time: 09/24/19 10:46 AM  Result Value Ref Range   Sodium 142 135 - 145 mmol/L   Potassium 4.6 3.5 - 5.1 mmol/L   Chloride 108 98 - 111 mmol/L   BUN 12 8 - 23 mg/dL   Creatinine, Ser 0.60 0.44 - 1.00 mg/dL   Glucose, Bld 110 (H) 70 - 99 mg/dL    Comment: Glucose reference range applies only to samples taken after fasting for at least 8 hours.   Calcium, Ion 1.24 1.15 - 1.40 mmol/L   TCO2 25 22 - 32 mmol/L   Hemoglobin 14.3 12.0 - 15.0 g/dL   HCT 42.0 36 - 46 %  SARS CORONAVIRUS 2 (TAT 6-24 HRS) Nasopharyngeal Nasopharyngeal Swab     Status: None   Collection Time: 12/08/19 12:34 PM   Specimen: Nasopharyngeal Swab  Result Value Ref Range   SARS Coronavirus 2 NEGATIVE NEGATIVE    Comment: (NOTE) SARS-CoV-2 target nucleic acids are NOT DETECTED.  The SARS-CoV-2 RNA is generally detectable in upper and lower respiratory specimens during the acute phase of infection. Negative results do not preclude SARS-CoV-2 infection, do not rule out co-infections with other pathogens, and should not be used as the sole basis for treatment or other patient management decisions. Negative results must be combined with clinical observations, patient history, and epidemiological information. The expected result is Negative.  Fact Sheet for Patients: SugarRoll.be  Fact Sheet for  Healthcare Providers: https://www.woods-mathews.com/  This test is not yet approved or cleared by the [place] FDA and  has been authorized for detection and/or diagnosis of SARS-CoV-2 by FDA under an Emergency Use Authorization (EUA). This EUA will remain  in effect (meaning this test can be used) for the duration of the COVID-19 declaration under Se ction 564(b)(1) of the Act, 21 U.S.C. section 360bbb-3(b)(1), unless the authorization is terminated or revoked sooner.  Performed at [facility], [place] [address] [place], [place] [address]   Surgical pathology     Status: None   Collection Time:  12/10/19  8:47 AM  Result Value Ref Range   SURGICAL PATHOLOGY      SURGICAL PATHOLOGY CASE: [id number] PATIENT: [facility] Surgical Pathology Report     Specimen Submitted: A. Colon polyp x2, cecum; cold snare B. Colon polyps x4, asc (x1)  transverse (x3); cs C. Colon polyp, sigmoid; cbx D. Colon nodular mucosa, sigmoid; cbx  Clinical History: Positive cologuard R19.5. Diverticulosis, colon polyps.    DIAGNOSIS: A. COLON POLYPS X2, CECUM; COLD SNARE: - FRAGMENTS (X2) OF TUBULAR ADENOMAS. - SINGLE FRAGMENT OF BENIGN COLONIC MUCOSA WITH SUPERFICIAL SESSILE/HYPERPLASTIC CHANGES. - FRAGMENTS (X2) OF BENIGN COLONIC MUCOSA WITH NO SIGNIFICANT HISTOPATHOLOGIC CHANGE. - NEGATIVE FOR HIGH-GRADE DYSPLASIA AND MALIGNANCY.  B. COLON POLYPS X4, ASCENDING (X1) AND TRANSVERSE (X3); COLD SNARE: - FRAGMENTS (X5) OF TUBULAR ADENOMAS. - SINGLE FRAGMENT OF SESSILE SERRATED POLYP. - NEGATIVE FOR HIGH-GRADE DYSPLASIA AND MALIGNANCY.  C. COLON POLYP, SIGMOID; COLD BIOPSY: - POLYPOID FRAGMENT OF BENIGN COLONIC MUCOSA WITH UNDERLYING  MATURE ADIPOSE TISSUE, COMPATIBLE WITH SUBMUCOSAL LIPOMA. - NEGATIVE FOR DYSPLASIA AND MALIGNANCY.  D. COLON, SIGMOID, NODULAR MUCOSA; COLD BIOPSY: - BENIGN COLONIC MUCOSA WITH SUPERFICIAL REACTIVE CHANGES. - NEGATIVE FOR FEATURES  OF MICROSCOPIC COLITIS. - NEGATIVE FOR DYSPLASIA AND MALIGNANCY.  GROSS DESCRIPTION: A. Labeled: Cold snared cecal colon polyps x2 Received: In formalin Tissue fragment(s): Multiple Size: 1 x 0.7 x 0.2 cm Description: Aggregate of tan polypoid fragments Entirely submitted in 1 cassette.  B. Labeled: Cold snared ascending x1 transverse x3 colon polyps Received: In formalin Tissue fragment(s): Multiple Size: 1.2 x 0.6 x 0.2 cm Description: Aggregate of tan polypoid fragments Entirely submitted in 1 cassette.  C. Labeled: Cold biopsy sigmoid colon polyp Received: In formalin Tissue fragment(s): 2 Size: 0.4 x 0.2 x 0.1 cm Description: Aggregate of tan polypoid fragments Entirely submitted in 1 cassette.  D. Labeled: Cold biopsy sigm oid nodular mucosa Received: In formalin Tissue fragment(s): Multiple Size: 1 x 0.3 x 0.1 cm Description: Aggregate of tan tissue fragments admixed with fecal matter Entirely submitted in 1 cassette.   Final Diagnosis performed by Allena Napoleon, MD.   Electronically signed 12/11/2019 8:45:05AM The electronic signature indicates that the named Attending Pathologist has evaluated the specimen Technical component performed at [facility], [address] [place], [place] [address] Lab: [phone] Dir: Rush Farmer, MD, MMM  Professional component performed at [facility], [facility], [place], [place], [place] [address] Lab: [phone] Dir: Dellia Nims. Rubinas, MD      PHQ2/9: Depression screen [facility] 2/9 12/21/2019 12/21/2019 08/11/2019 05/06/2019 08/08/2018  Decreased Interest 0 0 0 1 1  Down, Depressed, Hopeless 0 0 0 0 0  PHQ - 2 Score 0 0 0 1 1  Altered sleeping 0 - - 0 0  Tired, decreased energy 1 - - 1 1  Change in appetite 0 - - 0 2  Feeling bad or failure about yourself  0 - - 0 0  Trouble concentrating 0 - - 0 1  Moving slowly or fidgety/restless 0 - - 0 0  Suicidal thoughts 0 - - 0 0  PHQ-9 Score 1 - - 2 5  Difficult  doing work/chores Not difficult at all - - Not difficult at all Not difficult at all    phq 9 is negative   Fall Risk: Fall Risk  12/21/2019 08/11/2019 07/31/2019 05/06/2019 08/08/2018  Falls in the past year? 0 0 0 0 0  Number falls in past yr: 0 0 - 0 0  Injury with Fall? 0 0 -  0 0  Risk for fall due to : - Other (Comment) - - -  Risk for fall due to: Comment - lymphadema - - -  Follow up - Falls prevention discussed Falls prevention discussed - -     Functional Status Survey: Is the patient deaf or have difficulty hearing?: No Does the patient have difficulty seeing, even when wearing glasses/contacts?: No Does the patient have difficulty concentrating, remembering, or making decisions?: No Does the patient have difficulty walking or climbing stairs?: Yes Does the patient have difficulty dressing or bathing?: No Does the patient have difficulty doing errands alone such as visiting a doctor's office or shopping?: No    Assessment & Plan  1. Benign essential HTN  - telmisartan (MICARDIS) 40 MG tablet; Take 1 tablet (40 mg total) by mouth daily.  Dispense: 90 tablet; Refill: 1 - COMPLETE METABOLIC PANEL WITH GFR  2. Dyslipidemia   3. Dysmetabolic syndrome   4. Paroxysmal atrial fibrillation (HCC)   5. S/P TAVR (transcatheter aortic valve replacement)   6. Chronic acquired lymphedema   7. Hyperglycemia  - Hemoglobin A1c  8. History of endometrial cancer   9. Morbid obesity ([place])  Discussed with the patient the risk posed by an increased BMI. Discussed importance of portion control, calorie counting and at least 150 minutes of physical activity weekly. Avoid sweet beverages and drink more water. Eat at least 6 servings of fruit and vegetables daily   10. Combined systolic and diastolic congestive heart failure, unspecified HF chronicity (HCC)  Continue medications

## 2019-12-22 LAB — COMPLETE METABOLIC PANEL WITH GFR
AG Ratio: 1.6 (calc) (ref 1.0–2.5)
ALT: 24 U/L (ref 6–29)
AST: 21 U/L (ref 10–35)
Albumin: 4.2 g/dL (ref 3.6–5.1)
Alkaline phosphatase (APISO): 87 U/L (ref 37–153)
BUN: 18 mg/dL (ref 7–25)
CO2: 25 mmol/L (ref 20–32)
Calcium: 10.5 mg/dL — ABNORMAL HIGH (ref 8.6–10.4)
Chloride: 104 mmol/L (ref 98–110)
Creat: 0.68 mg/dL (ref 0.50–0.99)
GFR, Est African American: 104 mL/min/{1.73_m2} (ref >=60)
GFR, Est Non African American: 90 mL/min/{1.73_m2} (ref >=60)
Globulin: 2.6 g/dL (calc) (ref 1.9–3.7)
Glucose, Bld: 108 mg/dL — ABNORMAL HIGH (ref 65–99)
Potassium: 5 mmol/L (ref 3.5–5.3)
Sodium: 138 mmol/L (ref 135–146)
Total Bilirubin: 0.4 mg/dL (ref 0.2–1.2)
Total Protein: 6.8 g/dL (ref 6.1–8.1)

## 2019-12-22 LAB — HEMOGLOBIN A1C
Hgb A1c MFr Bld: 5.9 % of total Hgb — ABNORMAL HIGH (ref <5.7)
Mean Plasma Glucose: 123 (calc)
eAG (mmol/L): 6.8 (calc)

## 2019-12-28 ENCOUNTER — Ambulatory Visit (INDEPENDENT_AMBULATORY_CARE_PROVIDER_SITE_OTHER): Payer: Medicare HMO | Admitting: Surgery

## 2019-12-28 ENCOUNTER — Other Ambulatory Visit: Payer: Self-pay

## 2019-12-28 ENCOUNTER — Encounter: Payer: Self-pay | Admitting: Surgery

## 2019-12-28 VITALS — BP 158/87 | HR 79 | Temp 97.7°F | Ht 68.5 in | Wt 252.0 lb

## 2019-12-28 DIAGNOSIS — K641 Second degree hemorrhoids: Secondary | ICD-10-CM | POA: Diagnosis not present

## 2019-12-28 DIAGNOSIS — K644 Residual hemorrhoidal skin tags: Secondary | ICD-10-CM | POA: Diagnosis not present

## 2019-12-28 NOTE — Patient Instructions (Signed)
Recommend taking a fiber supplement daily. Something like Benefiber, Metamucil, or Citrucel.  May also use sitz baths daily. You can get a toilet sitz bath also. Or may run warm water over the area 1-2 times daily.   May use Vaseline for comfort if needed.     Hemorrhoids Hemorrhoids are swollen veins that may develop:  In the butt (rectum). These are called internal hemorrhoids.  Around the opening of the butt (anus). These are called external hemorrhoids. Hemorrhoids can cause pain, itching, or bleeding. Most of the time, they do not cause serious problems. They usually get better with diet changes, lifestyle changes, and other home treatments. What are the causes? This condition may be caused by:  Having trouble pooping (constipation).  Pushing hard (straining) to poop.  Watery poop (diarrhea).  Pregnancy.  Being very overweight (obese).  Sitting for long periods of time.  Heavy lifting or other activity that causes you to strain.  Anal sex.  Riding a bike for a long period of time. What are the signs or symptoms? Symptoms of this condition include:  Pain.  Itching or soreness in the butt.  Bleeding from the butt.  Leaking poop.  Swelling in the area.  One or more lumps around the opening of your butt. How is this diagnosed? A doctor can often diagnose this condition by looking at the affected area. The doctor may also:  Do an exam that involves feeling the area with a gloved hand (digital rectal exam).  Examine the area inside your butt using a small tube (anoscope).  Order blood tests. This may be done if you have lost a lot of blood.  Have you get a test that involves looking inside the colon using a flexible tube with a camera on the end (sigmoidoscopy or colonoscopy). How is this treated? This condition can usually be treated at home. Your doctor may tell you to change what you eat, make lifestyle changes, or try home treatments. If these do not  help, procedures can be done to remove the hemorrhoids or make them smaller. These may involve:  Placing rubber bands at the base of the hemorrhoids to cut off their blood supply.  Injecting medicine into the hemorrhoids to shrink them.  Shining a type of light energy onto the hemorrhoids to cause them to fall off.  Doing surgery to remove the hemorrhoids or cut off their blood supply. Follow these instructions at home: Eating and drinking   Eat foods that have a lot of fiber in them. These include whole grains, beans, nuts, fruits, and vegetables.  Ask your doctor about taking products that have added fiber (fibersupplements).  Reduce the amount of fat in your diet. You can do this by: ? Eating low-fat dairy products. ? Eating less red meat. ? Avoiding processed foods.  Drink enough fluid to keep your pee (urine) pale yellow. Managing pain and swelling   Take a warm-water bath (sitz bath) for 20 minutes to ease pain. Do this 3-4 times a day. You may do this in a bathtub or using a portable sitz bath that fits over the toilet.  If told, put ice on the painful area. It may be helpful to use ice between your warm baths. ? Put ice in a plastic bag. ? Place a towel between your skin and the bag. ? Leave the ice on for 20 minutes, 2-3 times a day. General instructions  Take over-the-counter and prescription medicines only as told by your doctor. ? Medicated  creams and medicines may be used as told.  Exercise often. Ask your doctor how much and what kind of exercise is best for you.  Go to the bathroom when you have the urge to poop. Do not wait.  Avoid pushing too hard when you poop.  Keep your butt dry and clean. Use wet toilet paper or moist towelettes after pooping.  Do not sit on the toilet for a long time.  Keep all follow-up visits as told by your doctor. This is important. Contact a doctor if you:  Have pain and swelling that do not get better with treatment or  medicine.  Have trouble pooping.  Cannot poop.  Have pain or swelling outside the area of the hemorrhoids. Get help right away if you have:  Bleeding that will not stop. Summary  Hemorrhoids are swollen veins in the butt or around the opening of the butt.  They can cause pain, itching, or bleeding.  Eat foods that have a lot of fiber in them. These include whole grains, beans, nuts, fruits, and vegetables.  Take a warm-water bath (sitz bath) for 20 minutes to ease pain. Do this 3-4 times a day. This information is not intended to replace advice given to you by your health care provider. Make sure you discuss any questions you have with your health care provider. Document Revised: 02/06/2018 Document Reviewed: 06/20/2017 Elsevier Patient Education  [place].

## 2019-12-31 ENCOUNTER — Encounter: Payer: Self-pay | Admitting: Surgery

## 2019-12-31 NOTE — Progress Notes (Signed)
Patient ID: [name], female   DOB: [birth year], 69 y.o.   MRN: [id number]  HPI LATRICE STORLIE is a 69 y.o. female seen in consultation at the request of Dr. Bonna Gains for questionable skin tag versus concerning anal lesion.  This was discover when she did a colonoscopy couple weeks ago.  Have personally reviewed the images and the pathology.  She had 7 polypectomies and they are negative for malignancy.  There are consistent with tubular adenomas without dysplasia. Of note she does have a significant history of aortic stenosis as well as GYN malignancy.  She underwent TAVR at [facility] due to her inability to go formal valve replacement given her cancer.  Her endometrial cancer was treated with brachytherapy and external beam radiation.  She has recovered well from both. She also reports some intermittent hematochezia.  No fevers no chills no melena. He also had a CT scan that have personally reviewed showing evidence of cholelithiasis diverticulosis without any other evidence of rectal malignancies.  Patient CBC and CMP was completely normal. He is able to perform more than 4 METS of activity without any shortness of breath or chest pain. She is fully anticoagulated on Coumadin  HPI  Past Medical History:  Diagnosis Date  . Aortic stenosis   . Cataract   . Depression   . Dysrhythmia   . Endometrial cancer determined by uterine biopsy ([place]) 10/29/2016   Rad tx's and internal brachytherapy.  Marland Kitchen Heart murmur    HX OF  . Hyperlipidemia   . Hypertension    CONTROLLED ON MEDS  . Lymphedema   . Shortness of breath dyspnea     Past Surgical History:  Procedure Laterality Date  . 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 CARDIAC CATHETERIZATION    . CARDIOVERSION  10/2019  . cataract surgery Bilateral 05/24/2014   second eye 06/07/2014  . COLONOSCOPY WITH PROPOFOL N/A 09/24/2019   Procedure: COLONOSCOPY WITH PROPOFOL;  Surgeon: [name], MD;  Location: [facility]  ENDOSCOPY;  Service: Gastroenterology;  Laterality: N/A;  . COLONOSCOPY WITH PROPOFOL N/A 12/10/2019   Procedure: COLONOSCOPY WITH PROPOFOL;  Surgeon: [name], MD;  Location: [facility] ENDOSCOPY;  Service: Endoscopy;  Laterality: N/A;  . CORONARY ARTERY BYPASS GRAFT    . EYE SURGERY    . MELANOMA EXCISION    . OVARIAN CYST REMOVAL     EXPLORATORY LAPAROTOMY  . TONSILLECTOMY      Family History  Problem Relation Age of Onset  . Stroke Mother   . Thyroid nodules Mother   . Lung cancer Mother   . Diabetes Sister   . Hypertension Sister   . Aortic aneurysm Father   . Breast cancer Maternal Aunt   . Bone cancer Maternal Uncle   . Depression Daughter   . Depression Daughter     Social History Social History   Tobacco Use  . Smoking status: Never Smoker  . Smokeless tobacco: Never Used  Vaping Use  . Vaping Use: Never used  Substance Use Topics  . Alcohol use: Not Currently    Alcohol/week: 0.0 standard drinks  . Drug use: No    Allergies  Allergen Reactions  . Ace Inhibitors Other (See Comments)    UNKNOWN REACTION  . Duloxetine Other (See Comments)    Drowsiness    Current Outpatient Medications  Medication Sig Dispense Refill  . Ascorbic Acid (VITAMIN C) 500 MG CAPS Take 1,000 mg by mouth in the morning and at bedtime.    Marland Kitchen  atorvastatin (LIPITOR) 40 MG tablet Take 1 tablet (40 mg total) by mouth 3 (three) times a week. 36 tablet 0  . B Complex Vitamins (B COMPLEX 1 PO) Take by mouth.    . Cholecalciferol (VITAMIN D-3) 125 MCG (5000 UT) TABS Take 3 tablets by mouth daily.    Marland Kitchen dofetilide (TIKOSYN) 125 MCG capsule Take by mouth.    Marland Kitchen JARDIANCE 10 MG TABS tablet Take 10 mg by mouth daily.    . Magnesium 500 MG CAPS Take 800 mg by mouth in the morning and at bedtime.     . metoprolol succinate (TOPROL-XL) 50 MG 24 hr tablet Take 50 mg by mouth 2 (two) times daily.    Marland Kitchen spironolactone (ALDACTONE) 25 MG tablet Take 25 mg by mouth 2 (two) times daily.    Marland Kitchen  telmisartan (MICARDIS) 40 MG tablet Take 1 tablet (40 mg total) by mouth daily. 90 tablet 1  . warfarin (COUMADIN) 5 MG tablet Take 2 tablets by mouth daily. Pt takes 2 tabs (10 mg) daily and 3 tabs (15 mg) on Wednesdays     No current facility-administered medications for this visit.     Review of Systems Full ROS  was asked and was negative except for the information on the HPI  Physical Exam Blood pressure (!) 158/87, pulse 79, temperature 97.7 F (36.5 C), height 5' 8.5" (1.74 m), weight 252 lb (114.3 kg), SpO2 98 %. CONSTITUTIONAL: NAD, BMI 38 EYES: Pupils are equal, round, , Sclera are non-icteric. EARS, NOSE, MOUTH AND THROAT:She is wearing a mask.Marland Kitchen Hearing is intact to voice. LYMPH NODES:  Lymph nodes in the neck are normal. RESPIRATORY:  Lungs are clear. There is normal respiratory effort, with equal breath sounds bilaterally, and without pathologic use of accessory muscles. CARDIOVASCULAR: Heart is regular systolic murmur, no , gallops, or rubs. GI: The abdomen is  soft, nontender, and nondistended. There are no palpable masses. There is no hepatosplenomegaly. There are normal bowel sounds in all quadrants.  Rectal :  Skin tag, no evidence of anal or rectal masses or ulceration. Grade II hemorrohoids.   MUSCULOSKELETAL: Normal muscle strength and tone. No cyanosis or edema.   SKIN: Turgor is good and there are no pathologic skin lesions or ulcers. NEUROLOGIC: Motor and sensation is grossly normal. Cranial nerves are grossly intact. PSYCH:  Oriented to person, place and time. Affect is normal.  Data Reviewed  I have personally reviewed the patient's imaging, laboratory findings and medical records.    Assessment/Plan 69 year old female with internal hemorrhoids as well as a skin tag at the anal margin.  There is no evidence of anorectal malignancies.  Discussed with the patient in detail about options we will be happy to perform an exam under anesthesia and excise the skin tag  or observe.  The patient is comfortable observing.  I am personally very comfortable observing the skin tag as it has no clinical fissure concern for malignancy. As far as the hemorrhoids discussed with patient detail about high-fiber diet, sitz bath's and Vaseline ointment.  No need for surgical intervention from the perspective either. Extensive counseling provided  Time spent with the patient was 45 minutes, with more than 50% of the time spent in face-to-face education, counseling and care coordination.     Caroleen Hamman, MD FACS General Surgeon 12/31/2019, 10:14 AM

## 2020-01-11 ENCOUNTER — Other Ambulatory Visit: Payer: Self-pay | Admitting: Family Medicine

## 2020-01-11 DIAGNOSIS — E785 Hyperlipidemia, unspecified: Secondary | ICD-10-CM

## 2020-01-11 MED ORDER — ATORVASTATIN CALCIUM 40 MG PO TABS: 40.0000 mg | ORAL_TABLET | ORAL | 0 refills | Status: DC

## 2020-01-28 ENCOUNTER — Ambulatory Visit: Payer: Medicare HMO | Attending: Obstetrics and Gynecology | Admitting: Occupational Therapy

## 2020-01-28 ENCOUNTER — Other Ambulatory Visit: Payer: Self-pay

## 2020-01-28 ENCOUNTER — Encounter: Payer: Self-pay | Admitting: Occupational Therapy

## 2020-01-28 DIAGNOSIS — I890 Lymphedema, not elsewhere classified: Secondary | ICD-10-CM

## 2020-01-28 NOTE — Patient Instructions (Signed)

## 2020-01-29 NOTE — Therapy (Signed)
[place] MAIN [facility] SERVICES [address] [place], [place], [address] Phone: [phone]   Fax:  [phone]  Occupational Therapy Evaluation  Patient Details  Name: Melody Soto MRN: [id number] Date of Birth: Dec 06, 1950 Referring Provider (OT): adolescence by report   Encounter Date: 01/28/2020   OT End of Session - 01/29/20 1306    Number of Visits 36    Date for OT Re-Evaluation 04/27/20    Activity Tolerance Patient tolerated treatment well;No increased pain    Behavior During Therapy WFL for tasks assessed/performed           Past Medical History:  Diagnosis Date  . Aortic stenosis   . Cataract   . Depression   . Dysrhythmia   . Endometrial cancer determined by uterine biopsy ([place]) 10/29/2016   Rad tx's and internal brachytherapy.  Marland Kitchen Heart murmur    HX OF  . Hyperlipidemia   . Hypertension    CONTROLLED ON MEDS  . Lymphedema   . Shortness of breath dyspnea     Past Surgical History:  Procedure Laterality Date  . 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 CARDIAC CATHETERIZATION    . CARDIOVERSION  10/2019  . cataract surgery Bilateral 05/24/2014   second eye 06/07/2014  . COLONOSCOPY WITH PROPOFOL N/A 09/24/2019   Procedure: COLONOSCOPY WITH PROPOFOL;  Surgeon: [name], MD;  Location: [facility] ENDOSCOPY;  Service: Gastroenterology;  Laterality: N/A;  . COLONOSCOPY WITH PROPOFOL N/A 12/10/2019   Procedure: COLONOSCOPY WITH PROPOFOL;  Surgeon: [name], MD;  Location: [facility] ENDOSCOPY;  Service: Endoscopy;  Laterality: N/A;  . CORONARY ARTERY BYPASS GRAFT    . EYE SURGERY    . MELANOMA EXCISION    . OVARIAN CYST REMOVAL     EXPLORATORY LAPAROTOMY  . TONSILLECTOMY      There were no vitals filed for this visit.   Subjective Assessment - 01/29/20 1249    Subjective  Melody Soto is referred to Occupational Therapy for evaluation and treatment of BLE lymphedema (LE) by Gillis Ends, MD. Melody Soto successfully completed both intensive and self-management phase CDT in 2019 with this therapist. She returns today with complaints of increased R leg and ankle swelling and states that her custom compression knee highs are worn out. See OT eval dated detailed information re LE onset in her early 67's and exacerbation and progression following Rx for endometrial cancer. Melody Soto goals for OT for Complete Decongestive Therapy (CDT) are to reduce swelling to limit LE progression and replace worn out compression garments    Pertinent History bilateral transcatheter aortic valve replacement 02/27/2017; melanoma excision; ovarian cyst removal; aortic stenosis, endometrial cancer 2018 w XRT and brachytherapy; HTN, SOB, BLE lipo-lymphedema    Limitations leg swelling and associated pain, decreased standing and walking tolerance >15 minutes    Repetition Increases Symptoms    Special Tests + Stemmer bolaterally    Patient Stated Goals reduce RLE ankle and foot swelling and keep it from gettin worse; replace worn outy custom compression garments.    Currently in Pain? Yes    Pain Score 4     Pain Location Leg    Pain Orientation Right;Left    Pain Descriptors / Indicators Aching;Tender;Pressure;Tightness;Heaviness;Tiring;Sore;Dull    Pain Type Chronic pain    Pain Onset Other (comment)   > 25 years   Pain Frequency Intermittent    Aggravating Factors  standing, walking, dependent sitting    Pain Relieving Factors elevation, compression,  MLD    Effect of Pain on Daily Activities leg swelling and associated pain impaires basic and instrumental ADLs ( fitting street shoes, fitting lower body clothing, LB bathing, skin inspection, skin care and nail care, funtional ambulation and mobility, shopping, home management, stair climbing, car transfers, tub transfers), 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 Fitted with custom BLE flat knit Elvarex knee highs (ccl 2) and Flexitouch se      Precautions   Precautions --   Hx endometrial cancer; cardiac   precautions, pulmonary precautions     Balance Screen   Has the patient fallen in the past 6 months No    Has the patient had a decrease in activity level because of a fear of falling?  No    Is the patient reluctant to leave their home because of a fear of falling?  No      Home  Environment   Family/patient expects to be discharged to: Private residence    Living Arrangements Alone    Available Help at Discharge Friend(s)    Type of [place] One level    Bathroom Shower/Tub Tub/Shower unit    Bathroom Accessibility Yes    How accessible Accessible via walker      Prior Function   Level of Independence Independent with basic ADLs;Independent with household mobility without device;Independent with community mobility without device;Independent with gait;Independent with transfers    Vocation Part time employment;Retired;Other (comment)   senior care provider   Vocation Requirements standing, walking, pushing manual wc, dependent sitting    Leisure friends      IADL   Prior Level of Function Shopping I    Shopping --   extra time, seated rest breaks q 15 mins   Prior Level of Function Light Housekeeping I    Light Housekeeping --   extra time, seated rest breaks q 15 mins   Prior Level of Function Meal  Prep I    Meal Prep --   extra time, seated rest breaks q 15 mins   Prior Level of Function Community Mobility I    Product/process development scientist Status --   expresses concerns re balance     Activity Tolerance   Activity Tolerance Endurance does not limit participation in activity      Cognition   Overall Cognitive Status Within Functional Limits for tasks assessed      Observation/Other Assessments   Observations BLE swelling from ankles to groin; moderate fatty fibrosis throughout with shelf at ankles , in keeping w lipedema, mildly dry skin, +Stemmer bialterally; redness Rfoot and ankle; cool skin temp; non-pitting; reported Hx of lymphorrhea, but absent today; denies hx of leg wounds; denies hx of cellulitis; sensation in tact bilaterally;  skin creases base of toes, ankles , medial feet, medial thighs, R>L    Focus on Therapeutic Outcomes (FOTO)  49/100 @ intake    Outcome Measures BLE limb volumetrics      Posture/Postural Control   Posture/Postural Control No significant limitations      Coordination   Gross Motor Movements are Fluid and Coordinated Yes    Fine Motor Movements are Fluid and Coordinated Yes      ROM / Strength   AROM / PROM / Strength AROM;Strength      AROM   Overall AROM  Within functional limits for tasks performed      Strength   Overall Strength Within functional limits for tasks performed            LYMPHEDEMA/ONCOLOGY QUESTIONNAIRE - 01/29/20 0001      Treatment   Past Radiation Treatment Yes    Body Site endometrial ca      What other symptoms do you have   Are you Having Heaviness or Tightness Yes    Are you having Pain Yes    Are you having pitting edema No    Is it Hard or Difficult finding clothes that fit Yes    Do you have infections No    Is there Decreased scar mobility No    Stemmer Sign Yes      Lymphedema Stage   Stage STAGE 2 SPONTANEOUSLY IRREVERSIBLE      Lymphedema Assessments    Lymphedema Assessments Lower extremities      Right Lower Extremity Lymphedema   Other TBA      Left Lower Extremity Lymphedema   Other TBA                  OT Treatments/Exercises (OP) - 01/29/20 0001      Transfers   Transfers Sit to Stand;Stand to Sit    Sit to Stand 6: Modified independent (Device/Increase time)    Stand to Sit 6: Modified independent (Device/Increase time)      ADLs   Grooming difficulty reaching feet to inspect skin, apply lotion, perform nail care    LB Dressing impaired 2.2 limb swelling    Functional Mobility difficulty lifting heavy legs during car transfers,tub transfers, bed mobility    Cooking requires seated rest breaks when > 15 mins    Home Maintenance light duty only; requires seated rest breaks when > 15 mins    Driving locally    Work limited sitting standing and walking tolerance limits job performance    Leisure limits leisure activities requiring sitting, standing and or walking >15 mins    ADL Education Given Yes      Manual Therapy   Manual Therapy Edema management                 OT Education - 01/29/20 1306    Education Details Provided Pt and family education regarding lymphatic structure and function, etiologies, onset patterns and stages of progression. Discussed  impact of obesity on lymphatic function. Outlined Complete Decongestive Therapy (CDT)  as standard of care and provided in depth information regarding 4 primary components of both Intensive and Self Management Phases, including Manual Lymph Drainage (MLD), compression wrapping and garments, skin care, and therapeutic exercise.   Pilar Plate discussion of high burden of care,  Discussed  Importance of daily, ongoing LE self-care essential to retaining clinical gains and limiting progression.  Lastly, reviewed lymphedema precautions, including cellulitis risk and difficulty with wound healing. Provided printed  Lymphedema Workbook for reference.    Person(s) Educated  Patient    Methods Explanation;Handout;Demonstration    Comprehension Verbalized understanding;Returned demonstration               OT Long Term Goals - 01/29/20 1306      OT LONG TERM GOAL #1   Title Pt modified independent w/ lymphedema precautions/prevention principals and using printed reference to limit LE progression and infection risk.    Baseline min A    Time 4    Period Days   4th OT Rx visit   Status New    Target Date --   4th OT Rx visit     OT LONG TERM GOAL #2   Title Lymphedema (LE) management/ self-care: Pt able to apply knee -length gradient compression wraps with extra time and printed reference PRN (modified independence) within 10 visits to achieve optimal limb volume reduction during Intensive Phase CDT.    Baseline Max A    Time 4    Period Days    Status New    Target Date --   4th OT Rx visit     OT LONG TERM GOAL #3   Title Lymphedema (LE) management/ self-care:  Pt to achieve at least 5% limb volume reductions below R knees during Intensive Phase CDT to limit LE progression, to improve tissue integrity, to decrease infection risk and to improve functional arm and hand use essential for basic and instrumental ADLs performance.    Baseline Mod A    Time 12    Period Weeks    Status New    Target Date 04/28/20      OT LONG TERM GOAL #4   Title Lymphedema (LE) management/ self-care:  Pt to tolerate daily compression wraps, compression garments and/ or HOS devices in keeping w/ prescribed wear regime within 1 week of issue date of each to progress and retain clinical and functional gains and to limit LE progression.    Baseline Max A    Time 12    Period Weeks    Status New    Target Date 04/28/20      OT LONG TERM GOAL #5   Title Given Pt's intake FOTO score of 49/100 , Pt will likely gain 7 points on functional scale by DC.    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 Pt sucessfully completed intensive and self-management phases of Complete Decongestive Therapy (CDT) in 2019 with this therapist achieving decreased limb volumes bilateraly, decreased pain and increased functional performance for LE self care and functional ambulation and mobility. Upon assessment lymphedema has progressed in the RLE since last seen. LLE appears well managed . Custom compression garments are worn out and in need of replacement. At present chronic leg swelling and associated pain limit beasic and instrumental ADLs, productive activities and leisure pursuits, social participation and body image. Pt will benefit from repeating modified Intensive Phase CDT to reduce limb volume, decrease infection risk , improve safe functional ambulation and transfers and limit lymphedema progression. We'll abbreviate  CDT and focus on RLE, fit with replacement custom garments, and reteach ther ex and LE  self care. Without skilled OT for CDT, lipolymphedema will progress and further functional decline is expected.    OT Occupational Profile and History Comprehensive Assessment- Review of records and extensive additional review of physical, cognitive, psychosocial history related to current functional performance    Occupational performance deficits (Please refer to evaluation for details): ADL's;IADL's;Work;Leisure;Social Participation;Other   body image   Rehab Potential Good    Clinical Decision Making Several treatment options, min-mod task modification necessary    Comorbidities Affecting Occupational Performance: Presence of comorbidities impacting occupational performance    Modification or Assistance to Complete Evaluation  Min-Moderate modification of tasks or assist with assess necessary to complete eval    OT Frequency 2x / week    OT  Duration 12 weeks   and PRN   OT Treatment/Interventions Self-care/ADL training;DME and/or AE instruction;Manual lymph drainage;Compression bandaging;Therapeutic activities;Therapeutic exercise;Other (comment);Manual Therapy;Patient/family education    Plan CDT to BLE: MLD, skin care, compression wraps and/ or garments, ther ex    Recommended Other Services replace existing custom, ccl 2 Elvarex flat knit knee high compression stockings    Consulted and Agree with Plan of Care Patient           Patient will benefit from skilled therapeutic intervention in order to improve the following deficits and impairments:           Visit Diagnosis: Lymphedema, not elsewhere classified - Plan: Ot plan of care cert/re-cert    Problem List Patient Active Problem List   Diagnosis Date Noted  . Positive colorectal cancer screening using Cologuard test   . Cecal polyp   . Polyp of sigmoid colon   . Paroxysmal atrial fibrillation ([place]) 10/02/2019  . Thoracic aortic ectasia ([place]) 08/31/2019  . Monitoring for anticoagulant use 01/06/2019  . Diverticulosis of colon 08/08/2018  . Cholelithiasis 08/08/2018  . Endometrial cancer ([place]) 04/10/2017  . 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 Chronic acquired lymphedema 01/08/2017  . Obesity, morbid, BMI 40.0-49.9 ([place]) 11/13/2016  . FIGO stage II endometrial cancer ([place]) 10/29/2016  . Left ventricular hypertrophy 11/27/2014  . Lymphedema of lower extremity 08/23/2014  . Severe aortic stenosis 08/21/2014  . Benign essential HTN 08/21/2014  . Depression, major, recurrent, mild ([place]) 08/21/2014  . Dyslipidemia 08/21/2014  . Cardiac dysrhythmia 08/21/2014  . Dysmetabolic syndrome 20/25/4270  . Symptomatic menopausal or female climacteric states 08/21/2014  . 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 History of shingles 08/21/2014  . Personal history of skin cancer 11/24/2009    Andrey Spearman, MS, OTR/L,  [facility] 01/29/20 1:41 PM  [place] MAIN [facility] SERVICES [address] [place], [place], [address] Phone: [phone]   Fax:  [phone]  Name: EARLENA WERST MRN: [id number] Date of Birth: 28-Sep-1950

## 2020-02-15 ENCOUNTER — Other Ambulatory Visit: Payer: Self-pay

## 2020-02-15 ENCOUNTER — Ambulatory Visit: Payer: Medicare HMO | Attending: Obstetrics and Gynecology | Admitting: Occupational Therapy

## 2020-02-15 DIAGNOSIS — I890 Lymphedema, not elsewhere classified: Secondary | ICD-10-CM

## 2020-02-15 NOTE — Therapy (Signed)
[place] MAIN [facility] SERVICES [address] [place], [place], [address] Phone: [phone]   Fax:  [phone]  Occupational Therapy Treatment  Patient Details  Name: Melody Soto MRN: [id number] Date of Birth: 06/07/1950 Referring Provider (OT): adolescence by report   Encounter Date: 02/15/2020   OT End of Session - 02/15/20 1336    Visit Number 2    Number of Visits 36    Date for OT Re-Evaluation 04/27/20    OT Start Time 1000    OT Stop Time 1115    OT Time Calculation (min) 75 min    Activity Tolerance Patient tolerated treatment well;No increased pain    Behavior During Therapy WFL for tasks assessed/performed           Past Medical History:  Diagnosis Date  . Aortic stenosis   . Cataract   . Depression   . Dysrhythmia   . Endometrial cancer determined by uterine biopsy ([place]) 10/29/2016   Rad tx's and internal brachytherapy.  Marland Kitchen Heart murmur    HX OF  . Hyperlipidemia   . Hypertension    CONTROLLED ON MEDS  . Lymphedema   . Shortness of breath dyspnea     Past Surgical History:  Procedure Laterality Date  . 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 CARDIAC CATHETERIZATION    . CARDIOVERSION  10/2019  . cataract surgery Bilateral 05/24/2014   second eye 06/07/2014  . COLONOSCOPY WITH PROPOFOL N/A 09/24/2019   Procedure: COLONOSCOPY WITH PROPOFOL;  Surgeon: [name], MD;  Location: [facility] ENDOSCOPY;  Service: Gastroenterology;  Laterality: N/A;  . COLONOSCOPY WITH PROPOFOL N/A 12/10/2019   Procedure: COLONOSCOPY WITH PROPOFOL;  Surgeon: [name], MD;  Location: [facility] ENDOSCOPY;  Service: Endoscopy;  Laterality: N/A;  . CORONARY ARTERY BYPASS GRAFT    . EYE SURGERY    . MELANOMA EXCISION    . OVARIAN CYST REMOVAL     EXPLORATORY LAPAROTOMY  . TONSILLECTOMY      There were no vitals filed for this visit.   Subjective Assessment - 02/15/20 1325    Subjective  Melody McGowanpresents for  OT Rx visit 2/36 to address BLE lipo-lymphedema. Pt brings post op shoe to slinic. Pt reports leg pain and swelling is unchanged since initial eval on 01/28/20.Marland Kitchen Pt requests we commence CDT to RLE first as it is more painful and swollen than LLE typically.    Pertinent History bilateral transcatheter aortic valve replacement 02/27/2017; melanoma excision; ovarian cyst removal; aortic stenosis, endometrial cancer 2018 w XRT and brachytherapy; HTN, SOB, BLE lipo-lymphedema    Limitations leg swelling and associated pain, decreased standing and walking tolerance >15 minutes    Repetition Increases Symptoms    Special Tests + Stemmer bolaterally    Patient Stated Goals reduce RLE ankle and foot swelling and keep it from gettin worse; replace worn outy custom compression garments.    Pain Onset Other (comment)   > 25 years              LYMPHEDEMA/ONCOLOGY QUESTIONNAIRE - 02/15/20 0001      Right Lower Extremity Lymphedema   Other RLE limb volume   ankle to tibial tuberosity (A-D) measures 5877.3 ml. RLE thigh volume (E-G) = 10554.9. RLE full imb volume ankle to groin (A-G) measures 16432.2 ml.    Other Limb volume differential for ankle to knee (A-D) leg segments = 1.1%, L>R, 2.9%, R>L for thigh segment (E-G) and 1.4%, r>L for full limb  volume LVD.      Left Lower Extremity Lymphedema   Other LLE limb volume   ankle to tibial tuberosity (A-D) measures 5942.1 ml. LLE thigh volume (E-G) = 10258.4 ml.Marland Kitchen LLE full imb volume ankle to groin (A-G) measures 16200.5 ml.                   OT Treatments/Exercises (OP) - 02/15/20 0001      ADLs   ADL Education Given Yes      Manual Therapy   Manual Therapy Edema management;Compression Bandaging;Manual Lymphatic Drainage (MLD)                       OT Long Term Goals - 01/29/20 1306      OT LONG TERM GOAL #1   Title Pt modified independent w/ lymphedema precautions/prevention principals and using printed reference to  limit LE progression and infection risk.    Baseline min A    Time 4    Period Days   4th OT Rx visit   Status New    Target Date --   4th OT Rx visit     OT LONG TERM GOAL #2   Title Lymphedema (LE) management/ self-care: Pt able to apply knee -length gradient compression wraps with extra time and printed reference PRN (modified independence) within 10 visits to achieve optimal limb volume reduction during Intensive Phase CDT.    Baseline Max A    Time 4    Period Days    Status New    Target Date --   4th OT Rx visit     OT LONG TERM GOAL #3   Title Lymphedema (LE) management/ self-care:  Pt to achieve at least 5% limb volume reductions below R knees during Intensive Phase CDT to limit LE progression, to improve tissue integrity, to decrease infection risk and to improve functional arm and hand use essential for basic and instrumental ADLs performance.    Baseline Mod A    Time 12    Period Weeks    Status New    Target Date 04/28/20      OT LONG TERM GOAL #4   Title Lymphedema (LE) management/ self-care:  Pt to tolerate daily compression wraps, compression garments and/ or HOS devices in keeping w/ prescribed wear regime within 1 week of issue date of each to progress and retain clinical and functional gains and to limit LE progression.    Baseline Max A    Time 12    Period Weeks    Status New    Target Date 04/28/20      OT LONG TERM GOAL #5   Title Given Pt's intake FOTO score of 49/100 , Pt will likely gain 7 points on functional scale by DC.    Baseline dependent    Time 12    Period Weeks    Status New    Target Date 04/28/20                 Plan - 02/15/20 1337    Clinical Impression Statement Completed initial volumetric calculations this morning, Limb volume   differential (LVD) calculations  reveal BLE are essentiall symetrical at all segemtns, leg, thigh and full limb. Limb swelling  is distributed a little differently for each leg, but swelling symmetry  is typical of lipo-lymphedema  presentation.  Limb volume differential for ankle to knee (A-D) leg segments = 1.1%, L>R, 2.9%, R>L for thigh segment (E-G)  and 1.4%, r>L for full limb volume LVD.  Commenced MLD to RLE using short neck sequence, deep abdominal breathing, functional LNs on R and Left , and stroxes from proximal to distal the back to terminus. MLD wel;l tolerated. Commenced knee length, multi later compression wraps to RLE from toes to distal knee utilizing gradient teckniques w short stretch wraps. All aspects of Rx well tolerated without pain or SOB. Cont as per POC. Next visit begin reviewing wraps  edu.    OT Occupational Profile and History Comprehensive Assessment- Review of records and extensive additional review of physical, cognitive, psychosocial history related to current functional performance    Occupational performance deficits (Please refer to evaluation for details): ADL's;IADL's;Work;Leisure;Social Participation;Other   body image   Rehab Potential Good    Clinical Decision Making Several treatment options, min-mod task modification necessary    Comorbidities Affecting Occupational Performance: Presence of comorbidities impacting occupational performance    Modification or Assistance to Complete Evaluation  Min-Moderate modification of tasks or assist with assess necessary to complete eval    OT Frequency 2x / week    OT Duration 12 weeks   and PRN   OT Treatment/Interventions Self-care/ADL training;DME and/or AE instruction;Manual lymph drainage;Compression bandaging;Therapeutic activities;Therapeutic exercise;Other (comment);Manual Therapy;Patient/family education    Plan CDT to BLE: MLD, skin care, compression wraps and/ or garments, ther ex    Recommended Other Services replace existing custom, ccl 2 Elvarex flat knit knee high compression stockings    Consulted and Agree with Plan of Care Patient           Patient will benefit from skilled therapeutic intervention  in order to improve the following deficits and impairments:           Visit Diagnosis: Lymphedema, not elsewhere classified    Problem List Patient Active Problem List   Diagnosis Date Noted  . Positive colorectal cancer screening using Cologuard test   . Cecal polyp   . Polyp of sigmoid colon   . Paroxysmal atrial fibrillation ([place]) 10/02/2019  . Thoracic aortic ectasia ([place]) 08/31/2019  . Monitoring for anticoagulant use 01/06/2019  . Diverticulosis of colon 08/08/2018  . Cholelithiasis 08/08/2018  . Endometrial cancer ([place]) 04/10/2017  . 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 Chronic acquired lymphedema 01/08/2017  . Obesity, morbid, BMI 40.0-49.9 ([place]) 11/13/2016  . FIGO stage II endometrial cancer ([place]) 10/29/2016  . Left ventricular hypertrophy 11/27/2014  . Lymphedema of lower extremity 08/23/2014  . Severe aortic stenosis 08/21/2014  . Benign essential HTN 08/21/2014  . Depression, major, recurrent, mild ([place]) 08/21/2014  . Dyslipidemia 08/21/2014  . Cardiac dysrhythmia 08/21/2014  . Dysmetabolic syndrome 0000000  . Symptomatic menopausal or female climacteric states 08/21/2014  . 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 History of shingles 08/21/2014  . Personal history of skin cancer 11/24/2009    Andrey Spearman, MS, OTR/L, [facility] 02/15/20 1:44 PM  [place] MAIN [facility] SERVICES [address] [place], [place], [address] Phone: [phone]   Fax:  [phone]  Name: Melody Soto MRN: [id number] Date of Birth: 1950/02/28

## 2020-02-18 ENCOUNTER — Other Ambulatory Visit: Payer: Self-pay

## 2020-02-18 ENCOUNTER — Encounter: Payer: Self-pay | Admitting: Occupational Therapy

## 2020-02-18 ENCOUNTER — Ambulatory Visit: Payer: Medicare HMO | Admitting: Occupational Therapy

## 2020-02-18 DIAGNOSIS — I890 Lymphedema, not elsewhere classified: Secondary | ICD-10-CM | POA: Diagnosis not present

## 2020-02-18 NOTE — Therapy (Signed)
[place] [facility] MAIN [facility] SERVICES [address] [place], [place], [address] Phone: [phone]   Fax:  [phone]  Occupational Therapy Treatment  Patient Details  Name: Melody Soto MRN: [id number] Date of Birth: 02-07-1951 Referring Provider (OT): adolescence by report   Encounter Date: 02/18/2020   OT End of Session - 02/18/20 1247    Visit Number 3    Number of Visits 36    Date for OT Re-Evaluation 04/27/20    OT Start Time 1006    OT Stop Time 1106    OT Time Calculation (min) 60 min    Activity Tolerance Patient tolerated treatment well;No increased pain    Behavior During Therapy WFL for tasks assessed/performed           Past Medical History:  Diagnosis Date  . Aortic stenosis   . Cataract   . Depression   . Dysrhythmia   . Endometrial cancer determined by uterine biopsy ([facility]) 10/29/2016   Rad tx's and internal brachytherapy.  Marland Kitchen Heart murmur    HX OF  . Hyperlipidemia   . Hypertension    CONTROLLED ON MEDS  . Lymphedema   . Shortness of breath dyspnea     Past Surgical History:  Procedure Laterality Date  . 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 CARDIAC CATHETERIZATION    . CARDIOVERSION  10/2019  . cataract surgery Bilateral 05/24/2014   second eye 06/07/2014  . COLONOSCOPY WITH PROPOFOL N/A 09/24/2019   Procedure: COLONOSCOPY WITH PROPOFOL;  Surgeon: Pasty Spillers, MD;  Location: [facility] ENDOSCOPY;  Service: Gastroenterology;  Laterality: N/A;  . COLONOSCOPY WITH PROPOFOL N/A 12/10/2019   Procedure: COLONOSCOPY WITH PROPOFOL;  Surgeon: Pasty Spillers, MD;  Location: [facility] ENDOSCOPY;  Service: Endoscopy;  Laterality: N/A;  . CORONARY ARTERY BYPASS GRAFT    . EYE SURGERY    . MELANOMA EXCISION    . OVARIAN CYST REMOVAL     EXPLORATORY LAPAROTOMY  . TONSILLECTOMY      There were no vitals filed for this visit.   Subjective Assessment - 02/18/20 1017    Subjective  Deb McGowanpresents for  OT Rx visit 2/36 to address BLE lipo-lymphedema. Pt brings post op shoe to slinic. Pt reports leg pain and swelling is unchanged since initial eval on 01/28/20.Marland Kitchen Pt requests we commence CDT to RLE first as it is more painful and swollen than LLE typically.    Pertinent History bilateral transcatheter aortic valve replacement 02/27/2017; melanoma excision; ovarian cyst removal; aortic stenosis, endometrial cancer 2018 w XRT and brachytherapy; HTN, SOB, BLE lipo-lymphedema    Limitations leg swelling and associated pain, decreased standing and walking tolerance >15 minutes    Repetition Increases Symptoms    Special Tests + Stemmer bolaterally    Patient Stated Goals reduce RLE ankle and foot swelling and keep it from gettin worse; replace worn outy custom compression garments.    Pain Onset Other (comment)   > 25 years                       OT Treatments/Exercises (OP) - 02/18/20 0001      ADLs   ADL Education Given Yes      Manual Therapy   Manual Therapy Edema management;Compression Bandaging;Manual Lymphatic Drainage (MLD)    Manual Lymphatic Drainage (MLD) MLD to RLE utilizing short neck sequence, deep abdominal breathing, functional inguinal LN and leg strokes from proximal to distal . Fibrosis techniques to medial and  lateral ankle.    Compression Bandaging knee length, multi layer gradient compression wraps using 8,10, and 12 cm  wide short stretch wraps over single layer of 0.4 cm thick Rosidal foam and stockinett.                  OT Education - 02/18/20 1246    Education Details Provided Pt education regarding lymphatic structure and function, etiologies, onset patterns and stages of progression. Discussed  impact of obesity on lymphatic function. Outlined Complete Decongestive Therapy (CDT)  as standard of care and provided in depth information regarding 4 primary components of both Intensive and Self Management Phases, including Manual Lymph Drainage (MLD),  compression wrapping and garments, skin care, and therapeutic exercise.   Pilar Plate discussion of high burden of care and contributing impact of existing co morbidities. We discussed  Importance of daily, ongoing LE self-care essential to retaining clinical gains and limiting progression.  Lastly, reviewed lymphedema precautions, including cellulitis risk and difficulty with wound healing. Provided printed Lymphedema Workbook for reference.    Person(s) Educated Patient    Methods Explanation;Handout;Demonstration    Comprehension Verbalized understanding;Returned demonstration               OT Long Term Goals - 01/29/20 1306      OT LONG TERM GOAL #1   Title Pt modified independent w/ lymphedema precautions/prevention principals and using printed reference to limit LE progression and infection risk.    Baseline min A    Time 4    Period Days   4th OT Rx visit   Status New    Target Date --   4th OT Rx visit     OT LONG TERM GOAL #2   Title Lymphedema (LE) management/ self-care: Pt able to apply knee -length gradient compression wraps with extra time and printed reference PRN (modified independence) within 10 visits to achieve optimal limb volume reduction during Intensive Phase CDT.    Baseline Max A    Time 4    Period Days    Status New    Target Date --   4th OT Rx visit     OT LONG TERM GOAL #3   Title Lymphedema (LE) management/ self-care:  Pt to achieve at least 5% limb volume reductions below R knees during Intensive Phase CDT to limit LE progression, to improve tissue integrity, to decrease infection risk and to improve functional arm and hand use essential for basic and instrumental ADLs performance.    Baseline Mod A    Time 12    Period Weeks    Status New    Target Date 04/28/20      OT LONG TERM GOAL #4   Title Lymphedema (LE) management/ self-care:  Pt to tolerate daily compression wraps, compression garments and/ or HOS devices in keeping w/ prescribed wear regime  within 1 week of issue date of each to progress and retain clinical and functional gains and to limit LE progression.    Baseline Max A    Time 12    Period Weeks    Status New    Target Date 04/28/20      OT LONG TERM GOAL #5   Title Given Pt's intake FOTO score of 49/100 , Pt will likely gain 7 points on functional scale by DC.    Baseline dependent    Time 12    Period Weeks    Status New    Target Date 04/28/20  Plan - 02/18/20 1249    Clinical Impression Statement Pt left wraps applied last visit in place until today. No obvious change in limb volume is noted in RLE below knee today. Commenced MLD to RLE/RLQ as established. Good tolerance for manual therapy and compression thus far. Cont as per POC.Commenced MLD to RLE today using pathways outlined in    OT Occupational Profile and History Comprehensive Assessment- Review of records and extensive additional review of physical, cognitive, psychosocial history related to current functional performance    Occupational performance deficits (Please refer to evaluation for details): ADL's;IADL's;Work;Leisure;Social Participation;Other   body image   Rehab Potential Good    Clinical Decision Making Several treatment options, min-mod task modification necessary    Comorbidities Affecting Occupational Performance: Presence of comorbidities impacting occupational performance    Modification or Assistance to Complete Evaluation  Min-Moderate modification of tasks or assist with assess necessary to complete eval    OT Frequency 2x / week    OT Duration 12 weeks   and PRN   OT Treatment/Interventions Self-care/ADL training;DME and/or AE instruction;Manual lymph drainage;Compression bandaging;Therapeutic activities;Therapeutic exercise;Other (comment);Manual Therapy;Patient/family education    Plan CDT to BLE: MLD, skin care, compression wraps and/ or garments, ther ex    Recommended Other Services replace existing custom,  ccl 2 Elvarex flat knit knee high compression stockings    Consulted and Agree with Plan of Care Patient           Patient will benefit from skilled therapeutic intervention in order to improve the following deficits and impairments:           Visit Diagnosis: Lymphedema, not elsewhere classified    Problem List Patient Active Problem List   Diagnosis Date Noted  . Positive colorectal cancer screening using Cologuard test   . Cecal polyp   . Polyp of sigmoid colon   . Paroxysmal atrial fibrillation ([place]) 10/02/2019  . Thoracic aortic ectasia ([place]) 08/31/2019  . Monitoring for anticoagulant use 01/06/2019  . Diverticulosis of colon 08/08/2018  . Cholelithiasis 08/08/2018  . Endometrial cancer ([place]) 04/10/2017  . 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 Chronic acquired lymphedema 01/08/2017  . Obesity, morbid, BMI 40.0-49.9 ([place]) 11/13/2016  . FIGO stage II endometrial cancer ([place]) 10/29/2016  . Left ventricular hypertrophy 11/27/2014  . Lymphedema of lower extremity 08/23/2014  . Severe aortic stenosis 08/21/2014  . Benign essential HTN 08/21/2014  . Depression, major, recurrent, mild ([place]) 08/21/2014  . Dyslipidemia 08/21/2014  . Cardiac dysrhythmia 08/21/2014  . Dysmetabolic syndrome 0000000  . Symptomatic menopausal or female climacteric states 08/21/2014  . 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 History of shingles 08/21/2014  . Personal history of skin cancer 11/24/2009    Andrey Spearman, MS, OTR/L, [facility] 02/18/20 12:53 PM   [place] MAIN [facility] SERVICES [address] [place], [place], [address] Phone: [phone]   Fax:  [phone]  Name: Melody Soto MRN: [id number] Date of Birth: 1950-11-02

## 2020-02-22 ENCOUNTER — Ambulatory Visit: Payer: Medicare HMO | Admitting: Occupational Therapy

## 2020-02-22 ENCOUNTER — Other Ambulatory Visit: Payer: Self-pay

## 2020-02-22 DIAGNOSIS — I890 Lymphedema, not elsewhere classified: Secondary | ICD-10-CM

## 2020-02-22 NOTE — Therapy (Signed)
[place] MAIN [facility] SERVICES [address] [place], [place], [address] Phone: [phone]   Fax:  [phone]  Occupational Therapy Treatment  Patient Details  Name: Melody Soto MRN: [id number] Date of Birth: [birth year] Referring Provider (OT): adolescence by report   Encounter Date: 02/22/2020   OT End of Session - 02/22/20 1255    Visit Number 4    Number of Visits 36    Date for OT Re-Evaluation 04/27/20    OT Start Time 0910    OT Stop Time 1010    OT Time Calculation (min) 60 min           Past Medical History:  Diagnosis Date  . Aortic stenosis   . Cataract   . Depression   . Dysrhythmia   . Endometrial cancer determined by uterine biopsy (Melody Soto) 10/29/2016   Rad tx's and internal brachytherapy.  Marland Kitchen Heart murmur    HX OF  . Hyperlipidemia   . Hypertension    CONTROLLED ON MEDS  . Lymphedema   . Shortness of breath dyspnea     Past Surgical History:  Procedure Laterality Date  . 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 CARDIAC CATHETERIZATION    . CARDIOVERSION  10/2019  . cataract surgery Bilateral 05/24/2014   second eye 06/07/2014  . COLONOSCOPY WITH PROPOFOL N/A 09/24/2019   Procedure: COLONOSCOPY WITH PROPOFOL;  Surgeon: Melody Manifold, MD;  Location: [facility] ENDOSCOPY;  Service: Gastroenterology;  Laterality: N/A;  . COLONOSCOPY WITH PROPOFOL N/A 12/10/2019   Procedure: COLONOSCOPY WITH PROPOFOL;  Surgeon: Melody Manifold, MD;  Location: [facility] ENDOSCOPY;  Service: Endoscopy;  Laterality: N/A;  . CORONARY ARTERY BYPASS GRAFT    . EYE SURGERY    . MELANOMA EXCISION    . OVARIAN CYST REMOVAL     EXPLORATORY LAPAROTOMY  . TONSILLECTOMY      There were no vitals filed for this visit.   Subjective Assessment - 02/22/20 1252    Subjective  Deb McGowanpresents for OT Rx visit 436 to address BLE lipo-lymphedema. Pt brings clean,  rolled compression wrap s to clinic. "I took them off before I  showered this morning." Pt does not rate LE related pain this morning. She continues to c/o    Pertinent History bilateral transcatheter aortic valve replacement 02/27/2017; melanoma excision; ovarian cyst removal; aortic stenosis, endometrial cancer 2018 w XRT and brachytherapy; HTN, SOB, BLE lipo-lymphedema    Limitations leg swelling and associated pain, decreased standing and walking tolerance >15 minutes    Repetition Increases Symptoms    Special Tests + Stemmer bolaterally    Patient Stated Goals reduce RLE ankle and foot swelling and keep it from gettin worse; replace worn outy custom compression garments.    Pain Onset Other (comment)   > 25 years                       OT Treatments/Exercises (OP) - 02/22/20 0001      ADLs   ADL Education Given Yes      Manual Therapy   Manual Therapy Edema management;Manual Lymphatic Drainage (MLD);Compression Bandaging;Taping    Manual therapy comments fibrosis techniques to R lateral submalleolar fat pad.    Manual Lymphatic Drainage (MLD) MLD to RLE utilizing short neck sequence, deep abdominal breathing, functional inguinal LN and leg strokes from proximal to distal . Fibrosis techniques to medial and lateral ankle.    Compression Bandaging knee length, multi layer gradient compression  wraps using 8,10, and 12 cm  wide short stretch wraps over single layer of 0.4 cm thick Rosidal foam and stockinett.    Kinesiotex Edema   2"wide KT spit in half and extending to posterior heel w/ lateral cross pieces to relieve achiles tendonitis pain. Also applied 2" anchor placed just above achilles tape, and 3 fingers exctending over lateral submaleolar fat pad to reduce edema and pain                 OT Education - 02/22/20 1307    Education Details Continued skilled Pt/caregiver education  And LE ADL training throughout visit for lymphedema self care/ home program, including compression wrapping, compression garment and device  wear/care, lymphatic pumping ther ex, simple self-MLD, and skin care. Discussed progress towards goals.    Person(s) Educated Patient    Methods Explanation;Demonstration    Comprehension Verbalized understanding;Returned demonstration               OT Long Term Goals - 01/29/20 1306      OT LONG TERM GOAL #1   Title Pt modified independent w/ lymphedema precautions/prevention principals and using printed reference to limit LE progression and infection risk.    Baseline min A    Time 4    Period Days   4th OT Rx visit   Status New    Target Date --   4th OT Rx visit     OT LONG TERM GOAL #2   Title Lymphedema (LE) management/ self-care: Pt able to apply knee -length gradient compression wraps with extra time and printed reference PRN (modified independence) within 10 visits to achieve optimal limb volume reduction during Intensive Phase CDT.    Baseline Max A    Time 4    Period Days    Status New    Target Date --   4th OT Rx visit     OT LONG TERM GOAL #3   Title Lymphedema (LE) management/ self-care:  Pt to achieve at least 5% limb volume reductions below R knees during Intensive Phase CDT to limit LE progression, to improve tissue integrity, to decrease infection risk and to improve functional arm and hand use essential for basic and instrumental ADLs performance.    Baseline Mod A    Time 12    Period Weeks    Status New    Target Date 04/28/20      OT LONG TERM GOAL #4   Title Lymphedema (LE) management/ self-care:  Pt to tolerate daily compression wraps, compression garments and/ or HOS devices in keeping w/ prescribed wear regime within 1 week of issue date of each to progress and retain clinical and functional gains and to limit LE progression.    Baseline Max A    Time 12    Period Weeks    Status New    Target Date 04/28/20      OT LONG TERM GOAL #5   Title Given Pt's intake FOTO score of 49/100 , Pt will likely gain 7 points on functional scale by DC.     Baseline dependent    Time 12    Period Weeks    Status New    Target Date 04/28/20                 Plan - 02/22/20 1303    Clinical Impression Statement RLE swelling below the knee midly decreased this morning, resulting in appearance of larger submaleolar fat pad./ Pt c/o mild  achilles tendonits, which has been recurrent recently. Apppied kinesio tape in effort to reduce pain, support ankle joint, and provide passisve skin stretch to area to reduce inflammation and distal leg and ankle swelling. Will continue and teach patient to apply if this gives her releif after a few tries. Pt verbalized understandingof how Ktape is used to reduce swelling . Pt tolerated MLD to RLE and reapplication of knee length gradient compression wraps without increased pain. Cont as per POC.    OT Occupational Profile and History Comprehensive Assessment- Review of records and extensive additional review of physical, cognitive, psychosocial history related to current functional performance    Occupational performance deficits (Please refer to evaluation for details): ADL's;IADL's;Work;Leisure;Social Participation;Other   body image   Rehab Potential Good    Clinical Decision Making Several treatment options, min-mod task modification necessary    Comorbidities Affecting Occupational Performance: Presence of comorbidities impacting occupational performance    Modification or Assistance to Complete Evaluation  Min-Moderate modification of tasks or assist with assess necessary to complete eval    OT Frequency 2x / week    OT Duration 12 weeks   and PRN   OT Treatment/Interventions Self-care/ADL training;DME and/or AE instruction;Manual lymph drainage;Compression bandaging;Therapeutic activities;Therapeutic exercise;Other (comment);Manual Therapy;Patient/family education    Plan CDT to BLE: MLD, skin care, compression wraps and/ or garments, ther ex    Recommended Other Services replace existing custom, ccl 2  Elvarex flat knit knee high compression stockings    Consulted and Agree with Plan of Care Patient           Patient will benefit from skilled therapeutic intervention in order to improve the following deficits and impairments:           Visit Diagnosis: Lymphedema, not elsewhere classified    Problem List Patient Active Problem List   Diagnosis Date Noted  . Positive colorectal cancer screening using Cologuard test   . Cecal polyp   . Polyp of sigmoid colon   . Paroxysmal atrial fibrillation ([place]) 10/02/2019  . Thoracic aortic ectasia ([place]) 08/31/2019  . Monitoring for anticoagulant use 01/06/2019  . Diverticulosis of colon 08/08/2018  . Cholelithiasis 08/08/2018  . Endometrial cancer ([place]) 04/10/2017  . 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 Chronic acquired lymphedema 01/08/2017  . Obesity, morbid, BMI 40.0-49.9 ([place]) 11/13/2016  . FIGO stage II endometrial cancer ([place]) 10/29/2016  . Left ventricular hypertrophy 11/27/2014  . Lymphedema of lower extremity 08/23/2014  . Severe aortic stenosis 08/21/2014  . Benign essential HTN 08/21/2014  . Depression, major, recurrent, mild ([place]) 08/21/2014  . Dyslipidemia 08/21/2014  . Cardiac dysrhythmia 08/21/2014  . Dysmetabolic syndrome 09/73/5329  . Symptomatic menopausal or female climacteric states 08/21/2014  . 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 History of shingles 08/21/2014  . Personal history of skin cancer 11/24/2009    Andrey Spearman, MS, OTR/L, [facility] 02/22/20 1:41 PM  [place] MAIN [facility] SERVICES [address] [place], [place], [address] Phone: [phone]   Fax:  [phone]  Name: JOLANA RUNKLES MRN: [id number] Date of Birth: [birth year]

## 2020-02-25 ENCOUNTER — Encounter: Payer: Medicare HMO | Admitting: Occupational Therapy

## 2020-02-25 DIAGNOSIS — I519 Heart disease, unspecified: Secondary | ICD-10-CM | POA: Diagnosis not present

## 2020-02-25 DIAGNOSIS — I10 Essential (primary) hypertension: Secondary | ICD-10-CM | POA: Diagnosis not present

## 2020-02-25 DIAGNOSIS — Z952 Presence of prosthetic heart valve: Secondary | ICD-10-CM | POA: Diagnosis not present

## 2020-02-25 DIAGNOSIS — I480 Paroxysmal atrial fibrillation: Secondary | ICD-10-CM | POA: Diagnosis not present

## 2020-02-29 ENCOUNTER — Ambulatory Visit: Payer: Medicare HMO | Admitting: Occupational Therapy

## 2020-03-01 ENCOUNTER — Other Ambulatory Visit: Payer: Self-pay | Admitting: Family Medicine

## 2020-03-01 DIAGNOSIS — I10 Essential (primary) hypertension: Secondary | ICD-10-CM

## 2020-03-03 ENCOUNTER — Ambulatory Visit: Payer: Medicare HMO | Admitting: Occupational Therapy

## 2020-03-03 DIAGNOSIS — I890 Lymphedema, not elsewhere classified: Secondary | ICD-10-CM | POA: Diagnosis not present

## 2020-03-03 NOTE — Therapy (Signed)
[place] MAIN [facility] SERVICES [address] [place], [place], [address] Phone: [phone]   Fax:  [phone]  Occupational Therapy Treatment  Patient Details  Name: Melody Soto MRN: [id number] Date of Birth: January 08, 1951 Referring Provider (OT): adolescence by report   Encounter Date: 03/03/2020   OT End of Session - 03/03/20 1302    Visit Number 5    Number of Visits 36    Date for OT Re-Evaluation 04/27/20    OT Start Time 1100    OT Stop Time 1210    OT Time Calculation (min) 70 min           Past Medical History:  Diagnosis Date  . Aortic stenosis   . Cataract   . Depression   . Dysrhythmia   . Endometrial cancer determined by uterine biopsy ([place]) 10/29/2016   Rad tx's and internal brachytherapy.  Marland Kitchen Heart murmur    HX OF  . Hyperlipidemia   . Hypertension    CONTROLLED ON MEDS  . Lymphedema   . Shortness of breath dyspnea     Past Surgical History:  Procedure Laterality Date  . 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 CARDIAC CATHETERIZATION    . CARDIOVERSION  10/2019  . cataract surgery Bilateral 05/24/2014   second eye 06/07/2014  . COLONOSCOPY WITH PROPOFOL N/A 09/24/2019   Procedure: COLONOSCOPY WITH PROPOFOL;  Surgeon: [name], MD;  Location: [facility] ENDOSCOPY;  Service: Gastroenterology;  Laterality: N/A;  . COLONOSCOPY WITH PROPOFOL N/A 12/10/2019   Procedure: COLONOSCOPY WITH PROPOFOL;  Surgeon: [name], MD;  Location: [facility] ENDOSCOPY;  Service: Endoscopy;  Laterality: N/A;  . CORONARY ARTERY BYPASS GRAFT    . EYE SURGERY    . MELANOMA EXCISION    . OVARIAN CYST REMOVAL     EXPLORATORY LAPAROTOMY  . TONSILLECTOMY      There were no vitals filed for this visit.   Subjective Assessment - 03/03/20 1107    Subjective  Melody McGowanpresents for OT Rx visit 5/36 to address BLE lipo-lymphedema. Pt states she lost 3 pounds last week. She reports she removed wraps to work in her  shed yesterday, so swelling might be increased today.    Pertinent History bilateral transcatheter aortic valve replacement 02/27/2017; melanoma excision; ovarian cyst removal; aortic stenosis, endometrial cancer 2018 w XRT and brachytherapy; HTN, SOB, BLE lipo-lymphedema    Limitations leg swelling and associated pain, decreased standing and walking tolerance >15 minutes    Repetition Increases Symptoms    Special Tests + Stemmer bolaterally    Patient Stated Goals reduce RLE ankle and foot swelling and keep it from gettin worse; replace worn outy custom compression garments.    Pain Onset Other (comment)   > 25 years                       OT Treatments/Exercises (OP) - 03/03/20 0001      ADLs   ADL Education Given Yes      Manual Therapy   Manual Therapy Edema management;Compression Bandaging    Edema Management completed anatomical measurements for RLE daytime custom compression garment and RLE HOS device. Faxed to vendor after session.                  OT Education - 03/03/20 1302    Education Details Continued skilled Pt/caregiver education  And LE ADL training throughout visit for lymphedema self care/ home program, including compression wrapping,  compression garment and device wear/care, lymphatic pumping ther ex, simple self-MLD, and skin care. Discussed progress towards goals.    Person(s) Educated Patient    Methods Explanation;Demonstration    Comprehension Verbalized understanding;Returned demonstration               OT Long Term Goals - 01/29/20 1306      OT LONG TERM GOAL #1   Title Pt modified independent w/ lymphedema precautions/prevention principals and using printed reference to limit LE progression and infection risk.    Baseline min A    Time 4    Period Days   4th OT Rx visit   Status New    Target Date --   4th OT Rx visit     OT LONG TERM GOAL #2   Title Lymphedema (LE) management/ self-care: Pt able to apply knee -length  gradient compression wraps with extra time and printed reference PRN (modified independence) within 10 visits to achieve optimal limb volume reduction during Intensive Phase CDT.    Baseline Max A    Time 4    Period Days    Status New    Target Date --   4th OT Rx visit     OT LONG TERM GOAL #3   Title Lymphedema (LE) management/ self-care:  Pt to achieve at least 5% limb volume reductions below R knees during Intensive Phase CDT to limit LE progression, to improve tissue integrity, to decrease infection risk and to improve functional arm and hand use essential for basic and instrumental ADLs performance.    Baseline Mod A    Time 12    Period Weeks    Status New    Target Date 04/28/20      OT LONG TERM GOAL #4   Title Lymphedema (LE) management/ self-care:  Pt to tolerate daily compression wraps, compression garments and/ or HOS devices in keeping w/ prescribed wear regime within 1 week of issue date of each to progress and retain clinical and functional gains and to limit LE progression.    Baseline Max A    Time 12    Period Weeks    Status New    Target Date 04/28/20      OT LONG TERM GOAL #5   Title Given Pt's intake FOTO score of 49/100 , Pt will likely gain 7 points on functional scale by DC.    Baseline dependent    Time 12    Period Weeks    Status New    Target Date 04/28/20                 Plan - 03/03/20 1303    Clinical Impression Statement RLE ready for fitting with replacement, flat knit, custom knee high compression stocking. We agreed to try ccl 2 garment vs ccl 3 with understanding that we caN HAVE GARMENT REMADE FOR CCL 3 prn. aLSO COMPLETED MEASUREMENTS FOR hos REPLACEMENTS- CCL 2 jOBST rELAX. cONT AS PER poc.    OT Occupational Profile and History Comprehensive Assessment- Review of records and extensive additional review of physical, cognitive, psychosocial history related to current functional performance    Occupational performance deficits (Please  refer to evaluation for details): ADL's;IADL's;Work;Leisure;Social Participation;Other   body image   Rehab Potential Good    Clinical Decision Making Several treatment options, min-mod task modification necessary    Comorbidities Affecting Occupational Performance: Presence of comorbidities impacting occupational performance    Modification or Assistance to Complete Evaluation  Min-Moderate modification  of tasks or assist with assess necessary to complete eval    OT Frequency 2x / week    OT Duration 12 weeks   and PRN   OT Treatment/Interventions Self-care/ADL training;DME and/or AE instruction;Manual lymph drainage;Compression bandaging;Therapeutic activities;Therapeutic exercise;Other (comment);Manual Therapy;Patient/family education    Plan CDT to BLE: MLD, skin care, compression wraps and/ or garments, ther ex    Recommended Other Services replace existing custom, ccl 2 Elvarex flat knit knee high compression stockings    Consulted and Agree with Plan of Care Patient           Patient will benefit from skilled therapeutic intervention in order to improve the following deficits and impairments:           Visit Diagnosis: Lymphedema, not elsewhere classified    Problem List Patient Active Problem List   Diagnosis Date Noted  . Positive colorectal cancer screening using Cologuard test   . Cecal polyp   . Polyp of sigmoid colon   . Paroxysmal atrial fibrillation ([place]) 10/02/2019  . Thoracic aortic ectasia ([place]) 08/31/2019  . Monitoring for anticoagulant use 01/06/2019  . Diverticulosis of colon 08/08/2018  . Cholelithiasis 08/08/2018  . Endometrial cancer ([place]) 04/10/2017  . 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 Chronic acquired lymphedema 01/08/2017  . Obesity, morbid, BMI 40.0-49.9 ([place]) 11/13/2016  . FIGO stage II endometrial cancer ([place]) 10/29/2016  . Left ventricular hypertrophy 11/27/2014  . Lymphedema of lower extremity 08/23/2014  .  Severe aortic stenosis 08/21/2014  . Benign essential HTN 08/21/2014  . Depression, major, recurrent, mild ([place]) 08/21/2014  . Dyslipidemia 08/21/2014  . Cardiac dysrhythmia 08/21/2014  . Dysmetabolic syndrome 19/50/9326  . Symptomatic menopausal or female climacteric states 08/21/2014  . 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 History of shingles 08/21/2014  . Personal history of skin cancer 11/24/2009    Andrey Spearman, MS, OTR/L, [facility] 03/03/20 1:05 PM   [place] MAIN [facility] SERVICES [address] [place], [place], [address] Phone: [phone]   Fax:  [phone]  Name: Melody Soto MRN: [id number] Date of Birth: 1950-07-31

## 2020-03-07 ENCOUNTER — Ambulatory Visit: Payer: Medicare HMO | Admitting: Occupational Therapy

## 2020-03-08 ENCOUNTER — Ambulatory Visit: Payer: Medicare HMO | Admitting: Occupational Therapy

## 2020-03-08 ENCOUNTER — Other Ambulatory Visit: Payer: Self-pay

## 2020-03-08 DIAGNOSIS — I890 Lymphedema, not elsewhere classified: Secondary | ICD-10-CM

## 2020-03-08 NOTE — Therapy (Deleted)
[place] MAIN [facility] SERVICES [address] [place], [place], [address] Phone: [phone]   Fax:  [phone]  Occupational Therapy Treatment  Patient Details  Name: Melody Soto MRN: [id number] Date of Birth: November 10, 1950 Referring Provider (OT): adolescence by report   Encounter Date: 03/08/2020   OT End of Session - 03/08/20 1642    Visit Number 6    Number of Visits 36    Date for OT Re-Evaluation 04/27/20    OT Start Time 1110    OT Stop Time 1210    OT Time Calculation (min) 60 min    Activity Tolerance Patient tolerated treatment well;No increased pain           Past Medical History:  Diagnosis Date  . Aortic stenosis   . Cataract   . Depression   . Dysrhythmia   . Endometrial cancer determined by uterine biopsy ([place]) 10/29/2016   Rad tx's and internal brachytherapy.  Marland Kitchen Heart murmur    HX OF  . Hyperlipidemia   . Hypertension    CONTROLLED ON MEDS  . Lymphedema   . Shortness of breath dyspnea     Past Surgical History:  Procedure Laterality Date  . 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 CARDIAC CATHETERIZATION    . CARDIOVERSION  10/2019  . cataract surgery Bilateral 05/24/2014   second eye 06/07/2014  . COLONOSCOPY WITH PROPOFOL N/A 09/24/2019   Procedure: COLONOSCOPY WITH PROPOFOL;  Surgeon: [name], MD;  Location: [facility] ENDOSCOPY;  Service: Gastroenterology;  Laterality: N/A;  . COLONOSCOPY WITH PROPOFOL N/A 12/10/2019   Procedure: COLONOSCOPY WITH PROPOFOL;  Surgeon: [name], MD;  Location: [facility] ENDOSCOPY;  Service: Endoscopy;  Laterality: N/A;  . CORONARY ARTERY BYPASS GRAFT    . EYE SURGERY    . MELANOMA EXCISION    . OVARIAN CYST REMOVAL     EXPLORATORY LAPAROTOMY  . TONSILLECTOMY      There were no vitals filed for this visit.                              OT Long Term Goals - 01/29/20 1306      OT LONG TERM GOAL #1   Title Pt modified  independent w/ lymphedema precautions/prevention principals and using printed reference to limit LE progression and infection risk.    Baseline min A    Time 4    Period Days   4th OT Rx visit   Status New    Target Date --   4th OT Rx visit     OT LONG TERM GOAL #2   Title Lymphedema (LE) management/ self-care: Pt able to apply knee -length gradient compression wraps with extra time and printed reference PRN (modified independence) within 10 visits to achieve optimal limb volume reduction during Intensive Phase CDT.    Baseline Max A    Time 4    Period Days    Status New    Target Date --   4th OT Rx visit     OT LONG TERM GOAL #3   Title Lymphedema (LE) management/ self-care:  Pt to achieve at least 5% limb volume reductions below R knees during Intensive Phase CDT to limit LE progression, to improve tissue integrity, to decrease infection risk and to improve functional arm and hand use essential for basic and instrumental ADLs performance.    Baseline Mod A    Time 12  Period Weeks    Status New    Target Date 04/28/20      OT LONG TERM GOAL #4   Title Lymphedema (LE) management/ self-care:  Pt to tolerate daily compression wraps, compression garments and/ or HOS devices in keeping w/ prescribed wear regime within 1 week of issue date of each to progress and retain clinical and functional gains and to limit LE progression.    Baseline Max A    Time 12    Period Weeks    Status New    Target Date 04/28/20      OT LONG TERM GOAL #5   Title Given Pt's intake FOTO score of 49/100 , Pt will likely gain 7 points on functional scale by DC.    Baseline dependent    Time 12    Period Weeks    Status New    Target Date 04/28/20                 Plan - 03/08/20 1643    Clinical Impression Statement Resumed MLD to RLE today with fibrosis techniques  concentrated at distal leg and ankle to reduce fibrosis. Pt tolerated all manual therapy without increased pain. Pt is  retaining clinical gains achieved thus far as she is awaiting custom garment and HOS replacement fitting. She continues to be dilkigent with all home program LE self care components. Cont as per POc.    OT Occupational Profile and History Comprehensive Assessment- Review of records and extensive additional review of physical, cognitive, psychosocial history related to current functional performance    Occupational performance deficits (Please refer to evaluation for details): ADL's;IADL's;Work;Leisure;Social Participation;Other   body image   Rehab Potential Good    Clinical Decision Making Several treatment options, min-mod task modification necessary    Comorbidities Affecting Occupational Performance: Presence of comorbidities impacting occupational performance    Modification or Assistance to Complete Evaluation  Min-Moderate modification of tasks or assist with assess necessary to complete eval    OT Frequency 2x / week    OT Duration 12 weeks   and PRN   OT Treatment/Interventions Self-care/ADL training;DME and/or AE instruction;Manual lymph drainage;Compression bandaging;Therapeutic activities;Therapeutic exercise;Other (comment);Manual Therapy;Patient/family education    Plan CDT to BLE: MLD, skin care, compression wraps and/ or garments, ther ex    Recommended Other Services replace existing custom, ccl 2 Elvarex flat knit knee high compression stockings    Consulted and Agree with Plan of Care Patient           Patient will benefit from skilled therapeutic intervention in order to improve the following deficits and impairments:           Visit Diagnosis: Lymphedema, not elsewhere classified    Problem List Patient Active Problem List   Diagnosis Date Noted  . Positive colorectal cancer screening using Cologuard test   . Cecal polyp   . Polyp of sigmoid colon   . Paroxysmal atrial fibrillation ([place]) 10/02/2019  . Thoracic aortic ectasia ([place]) 08/31/2019  . Monitoring for  anticoagulant use 01/06/2019  . Diverticulosis of colon 08/08/2018  . Cholelithiasis 08/08/2018  . Endometrial cancer ([place]) 04/10/2017  . 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 Chronic acquired lymphedema 01/08/2017  . Obesity, morbid, BMI 40.0-49.9 ([place]) 11/13/2016  . FIGO stage II endometrial cancer ([place]) 10/29/2016  . Left ventricular hypertrophy 11/27/2014  . Lymphedema of lower extremity 08/23/2014  . Severe aortic stenosis 08/21/2014  . Benign essential HTN 08/21/2014  . Depression, major,  recurrent, mild ([place]) 08/21/2014  . Dyslipidemia 08/21/2014  . Cardiac dysrhythmia 08/21/2014  . Dysmetabolic syndrome 41/93/7902  . Symptomatic menopausal or female climacteric states 08/21/2014  . 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 History of shingles 08/21/2014  . Personal history of skin cancer 11/24/2009    Ansel Bong 03/08/2020, 4:47 PM  [place] MAIN [facility] SERVICES [address] [place], [place], [address] Phone: [phone]   Fax:  [phone]  Name: Melody Soto MRN: [id number] Date of Birth: 01-06-1951

## 2020-03-08 NOTE — Therapy (Signed)
[place] MAIN [facility] SERVICES [address] [place], [place], [address] Phone: [phone]   Fax:  [phone]  Occupational Therapy Treatment  Patient Details  Name: Melody Soto MRN: [id number] Date of Birth: [birth year] Referring Provider (OT): adolescence by report   Encounter Date: 03/08/2020   OT End of Session - 03/08/20 1642    Visit Number 6    Number of Visits 36    Date for OT Re-Evaluation 04/27/20    OT Start Time 1110    OT Stop Time 1210    OT Time Calculation (min) 60 min    Activity Tolerance Patient tolerated treatment well;No increased pain           Past Medical History:  Diagnosis Date  . Aortic stenosis   . Cataract   . Depression   . Dysrhythmia   . Endometrial cancer determined by uterine biopsy ([place]) 10/29/2016   Rad tx's and internal brachytherapy.  Marland Kitchen Heart murmur    HX OF  . Hyperlipidemia   . Hypertension    CONTROLLED ON MEDS  . Lymphedema   . Shortness of breath dyspnea     Past Surgical History:  Procedure Laterality Date  . 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 CARDIAC CATHETERIZATION    . CARDIOVERSION  10/2019  . cataract surgery Bilateral 05/24/2014   second eye 06/07/2014  . COLONOSCOPY WITH PROPOFOL N/A 09/24/2019   Procedure: COLONOSCOPY WITH PROPOFOL;  Surgeon: [name], MD;  Location: [facility] ENDOSCOPY;  Service: Gastroenterology;  Laterality: N/A;  . COLONOSCOPY WITH PROPOFOL N/A 12/10/2019   Procedure: COLONOSCOPY WITH PROPOFOL;  Surgeon: [name], MD;  Location: [facility] ENDOSCOPY;  Service: Endoscopy;  Laterality: N/A;  . CORONARY ARTERY BYPASS GRAFT    . EYE SURGERY    . MELANOMA EXCISION    . OVARIAN CYST REMOVAL     EXPLORATORY LAPAROTOMY  . TONSILLECTOMY      There were no vitals filed for this visit.                 OT Treatments/Exercises (OP) - 03/08/20 1647      ADLs   ADL Education Given Yes      Manual Therapy    Manual Therapy Edema management;Manual Lymphatic Drainage (MLD);Compression Bandaging    Manual therapy comments fibrosis techniques to R lateral submalleolar fat pad.    Manual Lymphatic Drainage (MLD) MLD to RLE utilizing short neck sequence, deep abdominal breathing, functional inguinal LN and leg strokes from proximal to distal . Fibrosis techniques to medial and lateral ankle.    Compression Bandaging knee length, multi layer gradient compression wraps using 8,10, and 12 cm  wide short stretch wraps over single layer of 0.4 cm thick Rosidal foam and stockinett.                       OT Long Term Goals - 01/29/20 1306      OT LONG TERM GOAL #1   Title Pt modified independent w/ lymphedema precautions/prevention principals and using printed reference to limit LE progression and infection risk.    Baseline min A    Time 4    Period Days   4th OT Rx visit   Status New    Target Date --   4th OT Rx visit     OT LONG TERM GOAL #2   Title Lymphedema (LE) management/ self-care: Pt able to apply knee -length gradient compression wraps  with extra time and printed reference PRN (modified independence) within 10 visits to achieve optimal limb volume reduction during Intensive Phase CDT.    Baseline Max A    Time 4    Period Days    Status New    Target Date --   4th OT Rx visit     OT LONG TERM GOAL #3   Title Lymphedema (LE) management/ self-care:  Pt to achieve at least 5% limb volume reductions below R knees during Intensive Phase CDT to limit LE progression, to improve tissue integrity, to decrease infection risk and to improve functional arm and hand use essential for basic and instrumental ADLs performance.    Baseline Mod A    Time 12    Period Weeks    Status New    Target Date 04/28/20      OT LONG TERM GOAL #4   Title Lymphedema (LE) management/ self-care:  Pt to tolerate daily compression wraps, compression garments and/ or HOS devices in keeping w/ prescribed wear  regime within 1 week of issue date of each to progress and retain clinical and functional gains and to limit LE progression.    Baseline Max A    Time 12    Period Weeks    Status New    Target Date 04/28/20      OT LONG TERM GOAL #5   Title Given Pt's intake FOTO score of 49/100 , Pt will likely gain 7 points on functional scale by DC.    Baseline dependent    Time 12    Period Weeks    Status New    Target Date 04/28/20                 Plan - 03/08/20 1643    Clinical Impression Statement Resumed MLD to RLE today with fibrosis techniques  concentrated at distal leg and ankle to reduce fibrosis. Pt tolerated all manual therapy without increased pain. Pt is retaining clinical gains achieved thus far as she is awaiting custom garment and HOS replacement fitting. She continues to be dilkigent with all home program LE self care components. Cont as per POc.    OT Occupational Profile and History Comprehensive Assessment- Review of records and extensive additional review of physical, cognitive, psychosocial history related to current functional performance    Occupational performance deficits (Please refer to evaluation for details): ADL's;IADL's;Work;Leisure;Social Participation;Other   body image   Rehab Potential Good    Clinical Decision Making Several treatment options, min-mod task modification necessary    Comorbidities Affecting Occupational Performance: Presence of comorbidities impacting occupational performance    Modification or Assistance to Complete Evaluation  Min-Moderate modification of tasks or assist with assess necessary to complete eval    OT Frequency 2x / week    OT Duration 12 weeks   and PRN   OT Treatment/Interventions Self-care/ADL training;DME and/or AE instruction;Manual lymph drainage;Compression bandaging;Therapeutic activities;Therapeutic exercise;Other (comment);Manual Therapy;Patient/family education    Plan CDT to BLE: MLD, skin care, compression wraps  and/ or garments, ther ex    Recommended Other Services replace existing custom, ccl 2 Elvarex flat knit knee high compression stockings    Consulted and Agree with Plan of Care Patient           Patient will benefit from skilled therapeutic intervention in order to improve the following deficits and impairments:           Visit Diagnosis: Lymphedema, not elsewhere classified    Problem List  Patient Active Problem List   Diagnosis Date Noted  . Positive colorectal cancer screening using Cologuard test   . Cecal polyp   . Polyp of sigmoid colon   . Paroxysmal atrial fibrillation ([place]) 10/02/2019  . Thoracic aortic ectasia ([place]) 08/31/2019  . Monitoring for anticoagulant use 01/06/2019  . Diverticulosis of colon 08/08/2018  . Cholelithiasis 08/08/2018  . Endometrial cancer ([place]) 04/10/2017  . 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 Chronic acquired lymphedema 01/08/2017  . Obesity, morbid, BMI 40.0-49.9 ([place]) 11/13/2016  . FIGO stage II endometrial cancer ([place]) 10/29/2016  . Left ventricular hypertrophy 11/27/2014  . Lymphedema of lower extremity 08/23/2014  . Severe aortic stenosis 08/21/2014  . Benign essential HTN 08/21/2014  . Depression, major, recurrent, mild ([place]) 08/21/2014  . Dyslipidemia 08/21/2014  . Cardiac dysrhythmia 08/21/2014  . Dysmetabolic syndrome 0000000  . Symptomatic menopausal or female climacteric states 08/21/2014  . 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 History of shingles 08/21/2014  . Personal history of skin cancer 11/24/2009    Andrey Spearman, MS, OTR/L, [facility] 03/08/20 4:49 PM  [place] MAIN [facility] SERVICES [address] [place], [place], [address] Phone: [phone]   Fax:  [phone]  Name: LYNNZIE OSHIELDS MRN: [id number] Date of Birth: [birth year]

## 2020-03-10 ENCOUNTER — Other Ambulatory Visit: Payer: Self-pay

## 2020-03-10 ENCOUNTER — Ambulatory Visit: Payer: Medicare HMO | Admitting: Occupational Therapy

## 2020-03-10 DIAGNOSIS — I890 Lymphedema, not elsewhere classified: Secondary | ICD-10-CM | POA: Diagnosis not present

## 2020-03-10 NOTE — Therapy (Signed)
[place] MAIN [facility] SERVICES [address] [place], [place], [address] Phone: [phone]   Fax:  [phone]  Occupational Therapy Treatment  Patient Details  Name: Melody Soto MRN: [id number] Date of Birth: 10-Dec-1950 Referring Provider (OT): adolescence by report   Encounter Date: 03/10/2020   OT End of Session - 03/10/20 0919    Visit Number 7    Number of Visits 36    Date for OT Re-Evaluation 04/27/20    OT Start Time 0905    OT Stop Time 1015    OT Time Calculation (min) 70 min    Activity Tolerance Patient tolerated treatment well;No increased pain    Behavior During Therapy WFL for tasks assessed/performed           Past Medical History:  Diagnosis Date  . Aortic stenosis   . Cataract   . Depression   . Dysrhythmia   . Endometrial cancer determined by uterine biopsy ([place]) 10/29/2016   Rad tx's and internal brachytherapy.  Marland Kitchen Heart murmur    HX OF  . Hyperlipidemia   . Hypertension    CONTROLLED ON MEDS  . Lymphedema   . Shortness of breath dyspnea     Past Surgical History:  Procedure Laterality Date  . 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 CARDIAC CATHETERIZATION    . CARDIOVERSION  10/2019  . cataract surgery Bilateral 05/24/2014   second eye 06/07/2014  . COLONOSCOPY WITH PROPOFOL N/A 09/24/2019   Procedure: COLONOSCOPY WITH PROPOFOL;  Surgeon: [name], MD;  Location: [facility] ENDOSCOPY;  Service: Gastroenterology;  Laterality: N/A;  . COLONOSCOPY WITH PROPOFOL N/A 12/10/2019   Procedure: COLONOSCOPY WITH PROPOFOL;  Surgeon: [name], MD;  Location: [facility] ENDOSCOPY;  Service: Endoscopy;  Laterality: N/A;  . CORONARY ARTERY BYPASS GRAFT    . EYE SURGERY    . MELANOMA EXCISION    . OVARIAN CYST REMOVAL     EXPLORATORY LAPAROTOMY  . TONSILLECTOMY      There were no vitals filed for this visit.   Subjective Assessment - 03/10/20 0919    Subjective  Melody Soto  for OT Rx visit 7/36 to address BLE lipo-lymphedema. Pt reports her legs feel achey and tired , "mainly the R one" at level 3/10.Pt denies other complaints or concerns this morning.    Pertinent History bilateral transcatheter aortic valve replacement 02/27/2017; melanoma excision; ovarian cyst removal; aortic stenosis, endometrial cancer 2018 w XRT and brachytherapy; HTN, SOB, BLE lipo-lymphedema    Limitations leg swelling and associated pain, decreased standing and walking tolerance >15 minutes    Repetition Increases Symptoms    Special Tests + [place] bolaterally    Patient Stated Goals reduce RLE ankle and foot swelling and keep it from gettin worse; replace worn outy custom compression garments.    Pain Onset Other (comment)   > 25 years                       OT Treatments/Exercises (OP) - 03/10/20 1108      ADLs   ADL Education Given Yes      Manual Therapy   Manual Therapy Edema management;Manual Lymphatic Drainage (MLD);Compression Bandaging    Manual therapy comments skin care to RLE and fibrosis techniques to R lateral submalleolar fat pad throughout MLD to increase skin hydration and decrease fibrosis    Manual Lymphatic Drainage (MLD) MLD to RLE utilizing short neck sequence, deep abdominal breathing, functional inguinal LN  and leg strokes from proximal to distal . Fibrosis techniques to medial and lateral ankle.    Compression Bandaging knee length, multi layer gradient compression wraps using 8,10, and 12 cm  wide short stretch wraps over single layer of 0.4 cm thick Rosidal foam and stockinett.                  OT Education - 03/10/20 1107    Education Details Continued skilled Pt/caregiver education  And LE ADL training throughout visit for lymphedema self care/ home program, including compression wrapping, compression garment and device wear/care, lymphatic pumping ther ex, simple self-MLD, and skin care. Discussed progress towards goals.    Person(s)  Educated Patient    Methods Explanation;Demonstration    Comprehension Verbalized understanding;Returned demonstration               OT Long Term Goals - 01/29/20 1306      OT LONG TERM GOAL #1   Title Pt modified independent w/ lymphedema precautions/prevention principals and using printed reference to limit LE progression and infection risk.    Baseline min A    Time 4    Period Days   4th OT Rx visit   Status New    Target Date --   4th OT Rx visit     OT LONG TERM GOAL #2   Title Lymphedema (LE) management/ self-care: Pt able to apply knee -length gradient compression wraps with extra time and printed reference PRN (modified independence) within 10 visits to achieve optimal limb volume reduction during Intensive Phase CDT.    Baseline Max A    Time 4    Period Days    Status New    Target Date --   4th OT Rx visit     OT LONG TERM GOAL #3   Title Lymphedema (LE) management/ self-care:  Pt to achieve at least 5% limb volume reductions below R knees during Intensive Phase CDT to limit LE progression, to improve tissue integrity, to decrease infection risk and to improve functional arm and hand use essential for basic and instrumental ADLs performance.    Baseline Mod A    Time 12    Period Weeks    Status New    Target Date 04/28/20      OT LONG TERM GOAL #4   Title Lymphedema (LE) management/ self-care:  Pt to tolerate daily compression wraps, compression garments and/ or HOS devices in keeping w/ prescribed wear regime within 1 week of issue date of each to progress and retain clinical and functional gains and to limit LE progression.    Baseline Max A    Time 12    Period Weeks    Status New    Target Date 04/28/20      OT LONG TERM GOAL #5   Title Given Pt's intake FOTO score of 49/100 , Pt will likely gain 7 points on functional scale by DC.    Baseline dependent    Time 12    Period Weeks    Status New    Target Date 04/28/20                 Plan  - 03/10/20 1110    Clinical Impression Statement RLE is very well decongested todaybelow the knee. Leg is responding very well  to CDT this episode with both volume and pain reductions . MLD, skin care, fibrosis techniques and reapplication of gradient compression wraps tolerated well today without increased pain.  Plan of care includes shifting CDT to LLE once RLE garments are delivered and fitted. Cont as per POC.    OT Occupational Profile and History Comprehensive Assessment- Review of records and extensive additional review of physical, cognitive, psychosocial history related to current functional performance    Occupational performance deficits (Please refer to evaluation for details): ADL's;IADL's;Work;Leisure;Social Participation;Other   body image   Rehab Potential Good    Clinical Decision Making Several treatment options, min-mod task modification necessary    Comorbidities Affecting Occupational Performance: Presence of comorbidities impacting occupational performance    Modification or Assistance to Complete Evaluation  Min-Moderate modification of tasks or assist with assess necessary to complete eval    OT Frequency 2x / week    OT Duration 12 weeks   and PRN   OT Treatment/Interventions Self-care/ADL training;DME and/or AE instruction;Manual lymph drainage;Compression bandaging;Therapeutic activities;Therapeutic exercise;Other (comment);Manual Therapy;Patient/family education    Plan CDT to BLE: MLD, skin care, compression wraps and/ or garments, ther ex    Recommended Other Services replace existing custom, ccl 2 Elvarex flat knit knee high compression stockings    Consulted and Agree with Plan of Care Patient           Patient will benefit from skilled therapeutic intervention in order to improve the following deficits and impairments:           Visit Diagnosis: Lymphedema, not elsewhere classified    Problem List Patient Active Problem List   Diagnosis Date Noted  .  Positive colorectal cancer screening using Cologuard test   . Cecal polyp   . Polyp of sigmoid colon   . Paroxysmal atrial fibrillation ([place]) 10/02/2019  . Thoracic aortic ectasia ([place]) 08/31/2019  . Monitoring for anticoagulant use 01/06/2019  . Diverticulosis of colon 08/08/2018  . Cholelithiasis 08/08/2018  . Endometrial cancer ([place]) 04/10/2017  . 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 Chronic acquired lymphedema 01/08/2017  . Obesity, morbid, BMI 40.0-49.9 ([place]) 11/13/2016  . FIGO stage II endometrial cancer ([place]) 10/29/2016  . Left ventricular hypertrophy 11/27/2014  . Lymphedema of lower extremity 08/23/2014  . Severe aortic stenosis 08/21/2014  . Benign essential HTN 08/21/2014  . Depression, major, recurrent, mild ([place]) 08/21/2014  . Dyslipidemia 08/21/2014  . Cardiac dysrhythmia 08/21/2014  . Dysmetabolic syndrome 75/64/3329  . Symptomatic menopausal or female climacteric states 08/21/2014  . 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 History of shingles 08/21/2014  . Personal history of skin cancer 11/24/2009    Andrey Spearman, MS, OTR/L, [facility] 03/10/20 11:16 AM  [place] MAIN [facility] SERVICES [address] [place], [place], [address] Phone: [phone]   Fax:  [phone]  Name: Melody Soto MRN: [id number] Date of Birth: Feb 26, 1950

## 2020-03-11 ENCOUNTER — Encounter: Payer: Self-pay | Admitting: Nurse Practitioner

## 2020-03-11 NOTE — Progress Notes (Signed)
Received CMN/PA request from [facility] Medicaid for patient for prior approval of lymphedema garments. Completed documentation and faxed back.

## 2020-03-14 ENCOUNTER — Ambulatory Visit: Payer: Medicare HMO | Admitting: Occupational Therapy

## 2020-03-14 ENCOUNTER — Other Ambulatory Visit: Payer: Self-pay

## 2020-03-14 DIAGNOSIS — I890 Lymphedema, not elsewhere classified: Secondary | ICD-10-CM

## 2020-03-14 NOTE — Therapy (Signed)
[place] MAIN [facility] SERVICES [address] [place], [place], [address] Phone: [phone]   Fax:  [phone]  Occupational Therapy Treatment  Patient Details  Name: Melody Soto MRN: [id number] Date of Birth: 1950/10/11 Referring Provider (OT): adolescence by report   Encounter Date: 03/14/2020   OT End of Session - 03/14/20 1125    Visit Number 8    Number of Visits 36    Date for OT Re-Evaluation 04/27/20    OT Start Time 0910    OT Stop Time 1020    OT Time Calculation (min) 70 min    Activity Tolerance Patient tolerated treatment well;No increased pain    Behavior During Therapy WFL for tasks assessed/performed           Past Medical History:  Diagnosis Date  . Aortic stenosis   . Cataract   . Depression   . Dysrhythmia   . Endometrial cancer determined by uterine biopsy ([place]) 10/29/2016   Rad tx's and internal brachytherapy.  Marland Kitchen Heart murmur    HX OF  . Hyperlipidemia   . Hypertension    CONTROLLED ON MEDS  . Lymphedema   . Shortness of breath dyspnea     Past Surgical History:  Procedure Laterality Date  . 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 CARDIAC CATHETERIZATION    . CARDIOVERSION  10/2019  . cataract surgery Bilateral 05/24/2014   second eye 06/07/2014  . COLONOSCOPY WITH PROPOFOL N/A 09/24/2019   Procedure: COLONOSCOPY WITH PROPOFOL;  Surgeon: [name], MD;  Location: [facility] ENDOSCOPY;  Service: Gastroenterology;  Laterality: N/A;  . COLONOSCOPY WITH PROPOFOL N/A 12/10/2019   Procedure: COLONOSCOPY WITH PROPOFOL;  Surgeon: [name], MD;  Location: [facility] ENDOSCOPY;  Service: Endoscopy;  Laterality: N/A;  . CORONARY ARTERY BYPASS GRAFT    . EYE SURGERY    . MELANOMA EXCISION    . OVARIAN CYST REMOVAL     EXPLORATORY LAPAROTOMY  . TONSILLECTOMY      There were no vitals filed for this visit.                              OT Long Term Goals -  01/29/20 1306      OT LONG TERM GOAL #1   Title Pt modified independent w/ lymphedema precautions/prevention principals and using printed reference to limit LE progression and infection risk.    Baseline min A    Time 4    Period Days   4th OT Rx visit   Status New    Target Date --   4th OT Rx visit     OT LONG TERM GOAL #2   Title Lymphedema (LE) management/ self-care: Pt able to apply knee -length gradient compression wraps with extra time and printed reference PRN (modified independence) within 10 visits to achieve optimal limb volume reduction during Intensive Phase CDT.    Baseline Max A    Time 4    Period Days    Status New    Target Date --   4th OT Rx visit     OT LONG TERM GOAL #3   Title Lymphedema (LE) management/ self-care:  Pt to achieve at least 5% limb volume reductions below R knees during Intensive Phase CDT to limit LE progression, to improve tissue integrity, to decrease infection risk and to improve functional arm and hand use essential for basic and instrumental ADLs performance.  Baseline Mod A    Time 12    Period Weeks    Status New    Target Date 04/28/20      OT LONG TERM GOAL #4   Title Lymphedema (LE) management/ self-care:  Pt to tolerate daily compression wraps, compression garments and/ or HOS devices in keeping w/ prescribed wear regime within 1 week of issue date of each to progress and retain clinical and functional gains and to limit LE progression.    Baseline Max A    Time 12    Period Weeks    Status New    Target Date 04/28/20      OT LONG TERM GOAL #5   Title Given Pt's intake FOTO score of 49/100 , Pt will likely gain 7 points on functional scale by DC.    Baseline dependent    Time 12    Period Weeks    Status New    Target Date 04/28/20                 Plan - 03/14/20 1133    Clinical Impression Statement Pt presents with compression wraps in place. Limb volume continues to reduce. Fat pat at lateral foot and malleolus  is slightly reduced, but fibrotic tissue is long standing and fully ensconced. Pt tolerated MLD, skin care and reapplication of compression wraps this morning without increased pain. We modified wraps to exclude foot, but contain distal leg and malleolion trial basis. Cont as per POC while awaiting delivery and fitting for RLE custom garment and device.    OT Occupational Profile and History Comprehensive Assessment- Review of records and extensive additional review of physical, cognitive, psychosocial history related to current functional performance    Occupational performance deficits (Please refer to evaluation for details): ADL's;IADL's;Work;Leisure;Social Participation;Other    Rehab Potential Good    Clinical Decision Making Several treatment options, min-mod task modification necessary    Comorbidities Affecting Occupational Performance: Presence of comorbidities impacting occupational performance    Modification or Assistance to Complete Evaluation  Min-Moderate modification of tasks or assist with assess necessary to complete eval    OT Frequency 2x / week    OT Duration 12 weeks    OT Treatment/Interventions Self-care/ADL training;DME and/or AE instruction;Manual lymph drainage;Compression bandaging;Therapeutic activities;Therapeutic exercise;Other (comment);Manual Therapy;Patient/family education    Plan CDT to BLE: MLD, skin care, compression wraps and/ or garments, ther ex    Recommended Other Services replace existing custom, ccl 2 Elvarex flat knit knee high compression stockings    Consulted and Agree with Plan of Care Patient           Patient will benefit from skilled therapeutic intervention in order to improve the following deficits and impairments:           Visit Diagnosis: Lymphedema, not elsewhere classified    Problem List Patient Active Problem List   Diagnosis Date Noted  . Positive colorectal cancer screening using Cologuard test   . Cecal polyp   .  Polyp of sigmoid colon   . Paroxysmal atrial fibrillation ([place]) 10/02/2019  . Thoracic aortic ectasia ([place]) 08/31/2019  . Monitoring for anticoagulant use 01/06/2019  . Diverticulosis of colon 08/08/2018  . Cholelithiasis 08/08/2018  . Endometrial cancer ([place]) 04/10/2017  . 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 Chronic acquired lymphedema 01/08/2017  . Obesity, morbid, BMI 40.0-49.9 ([place]) 11/13/2016  . FIGO stage II endometrial cancer ([place]) 10/29/2016  . Left ventricular hypertrophy 11/27/2014  . Lymphedema of  lower extremity 08/23/2014  . Severe aortic stenosis 08/21/2014  . Benign essential HTN 08/21/2014  . Depression, major, recurrent, mild ([place]) 08/21/2014  . Dyslipidemia 08/21/2014  . Cardiac dysrhythmia 08/21/2014  . Dysmetabolic syndrome 86/16/8372  . Symptomatic menopausal or female climacteric states 08/21/2014  . 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 History of shingles 08/21/2014  . Personal history of skin cancer 11/24/2009    Bertell Maria, MS, OTR/L, [facility] 03/14/20 11:38 AM   [place] MAIN [facility] SERVICES [address] [place], [place], [address] Phone: [phone]   Fax:  [phone]  Name: LANORE RENDEROS MRN: [id number] Date of Birth: 08-Aug-1950

## 2020-03-17 ENCOUNTER — Other Ambulatory Visit: Payer: Self-pay

## 2020-03-17 ENCOUNTER — Ambulatory Visit: Payer: Medicare HMO | Attending: Obstetrics and Gynecology | Admitting: Occupational Therapy

## 2020-03-17 DIAGNOSIS — I890 Lymphedema, not elsewhere classified: Secondary | ICD-10-CM | POA: Insufficient documentation

## 2020-03-17 NOTE — Therapy (Signed)
[place] MAIN [facility] SERVICES [address] [place], [place], [address] Phone: [phone]   Fax:  [phone]  Occupational Therapy Treatment  Patient Details  Name: Melody Soto MRN: [id number] Date of Birth: 01-Mar-1950 Referring Provider (OT): adolescence by report   Encounter Date: 03/17/2020   OT End of Session - 03/17/20 1009    Visit Number 9    Number of Visits 36    Date for OT Re-Evaluation 04/27/20    OT Start Time 1002    OT Stop Time 1120    OT Time Calculation (min) 78 min    Activity Tolerance Patient tolerated treatment well;No increased pain    Behavior During Therapy WFL for tasks assessed/performed           Past Medical History:  Diagnosis Date  . Aortic stenosis   . Cataract   . Depression   . Dysrhythmia   . Endometrial cancer determined by uterine biopsy ([place]) 10/29/2016   Rad tx's and internal brachytherapy.  Marland Kitchen Heart murmur    HX OF  . Hyperlipidemia   . Hypertension    CONTROLLED ON MEDS  . Lymphedema   . Shortness of breath dyspnea     Past Surgical History:  Procedure Laterality Date  . 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 CARDIAC CATHETERIZATION    . CARDIOVERSION  10/2019  . cataract surgery Bilateral 05/24/2014   second eye 06/07/2014  . COLONOSCOPY WITH PROPOFOL N/A 09/24/2019   Procedure: COLONOSCOPY WITH PROPOFOL;  Surgeon: [name], MD;  Location: [facility] ENDOSCOPY;  Service: Gastroenterology;  Laterality: N/A;  . COLONOSCOPY WITH PROPOFOL N/A 12/10/2019   Procedure: COLONOSCOPY WITH PROPOFOL;  Surgeon: [name], MD;  Location: [facility] END[facility]COPY;  Service: Endoscopy;  Laterality: N/A;  . CORONARY ARTERY BYPASS GRAFT    . EYE SURGERY    . MELANOMA EXCISION    . OVARIAN CYST REMOVAL     EXPLORATORY LAPAROTOMY  . TONSILLECTOMY      There were no vitals filed for this visit.   Subjective Assessment - 03/17/20 1010    Subjective  Melody McGowanpresents for  OT Rx visit 9/36 to address BLE lipo-lymphedema. Pt denies LE related leg pain this morning. Custom garment has not yet arrived for fitting. Pt has no new concerns today.    Pertinent History bilateral transcatheter aortic valve replacement 02/27/2017; melanoma excision; ovarian cyst removal; aortic stenosis, endometrial cancer 2018 w XRT and brachytherapy; HTN, SOB, BLE lipo-lymphedema    Limitations leg swelling and associated pain, decreased standing and walking tolerance >15 minutes    Repetition Increases Symptoms    Special Tests + Stemmer bolaterally    Patient Stated Goals reduce RLE ankle and foot swelling and keep it from gettin worse; replace worn outy custom compression garments.    Pain Onset Other (comment)   > 25 years                       OT Treatments/Exercises (OP) - 03/17/20 1012      ADLs   ADL Education Given Yes (P)       Manual Therapy   Manual Therapy Edema management;Manual Lymphatic Drainage (MLD);Compression Bandaging (P)     Manual therapy comments skin care to RLE and fibrosis techniques to R lateral submalleolar fat pad throughout MLD to increase skin hydration and decrease fibrosis (P)     Manual Lymphatic Drainage (MLD) MLD to RLE utilizing short neck sequence, deep  abdominal breathing, functional inguinal LN and leg strokes from proximal to distal . Fibrosis techniques to medial and lateral ankle. (P)     Compression Bandaging knee length, multi layer gradient compression wraps using 8,10, and 12 cm  wide short stretch wraps over single layer of 0.4 cm thick Rosidal foam and stockinett. (P)                   OT Education - 03/17/20 1655    Education Details Continued skilled Pt/caregiver education  And LE ADL training throughout visit for lymphedema self care/ home program, including compression wrapping, compression garment and device wear/care, lymphatic pumping ther ex, simple self-MLD, and skin care. Discussed progress towards goals.     Person(s) Educated Patient    Methods Explanation;Demonstration    Comprehension Verbalized understanding;Returned demonstration               OT Long Term Goals - 01/29/20 1306      OT LONG TERM GOAL #1   Title Pt modified independent w/ lymphedema precautions/prevention principals and using printed reference to limit LE progression and infection risk.    Baseline min A    Time 4    Period Days   4th OT Rx visit   Status New    Target Date --   4th OT Rx visit     OT LONG TERM GOAL #2   Title Lymphedema (LE) management/ self-care: Pt able to apply knee -length gradient compression wraps with extra time and printed reference PRN (modified independence) within 10 visits to achieve optimal limb volume reduction during Intensive Phase CDT.    Baseline Max A    Time 4    Period Days    Status New    Target Date --   4th OT Rx visit     OT LONG TERM GOAL #3   Title Lymphedema (LE) management/ self-care:  Pt to achieve at least 5% limb volume reductions below R knees during Intensive Phase CDT to limit LE progression, to improve tissue integrity, to decrease infection risk and to improve functional arm and hand use essential for basic and instrumental ADLs performance.    Baseline Mod A    Time 12    Period Weeks    Status New    Target Date 04/28/20      OT LONG TERM GOAL #4   Title Lymphedema (LE) management/ self-care:  Pt to tolerate daily compression wraps, compression garments and/ or HOS devices in keeping w/ prescribed wear regime within 1 week of issue date of each to progress and retain clinical and functional gains and to limit LE progression.    Baseline Max A    Time 12    Period Weeks    Status New    Target Date 04/28/20      OT LONG TERM GOAL #5   Title Given Pt's intake FOTO score of 49/100 , Pt will likely gain 7 points on functional scale by DC.    Baseline dependent    Time 12    Period Weeks    Status New    Target Date 04/28/20                  Plan - 03/17/20 1656    Clinical Impression Statement Pt presents with compression wraps in place. Pt continues to manage RLE compression wraps very well between visits. Skin is mildly reddened at anterior ankle from bandaging settling and bunching slightly in this  area due to sliding downward. Cont to monitor skin condition carefully. Provided MLD, skin care , and gradient compression wraps to RLE witho7ut increased pain. Pt cntinues to demonstrate progress towards all OT goals for lymphedema care. RLE custom garment is ordered and we are awaiting fitting before commencing CDT to LLE. Cont as per POC.    OT Occupational Profile and History Comprehensive Assessment- Review of records and extensive additional review of physical, cognitive, psychosocial history related to current functional performance    Occupational performance deficits (Please refer to evaluation for details): ADL's;IADL's;Work;Leisure;Social Participation;Other    Rehab Potential Good    Clinical Decision Making Several treatment options, min-mod task modification necessary    Comorbidities Affecting Occupational Performance: Presence of comorbidities impacting occupational performance    Modification or Assistance to Complete Evaluation  Min-Moderate modification of tasks or assist with assess necessary to complete eval    OT Frequency 2x / week    OT Duration 12 weeks    OT Treatment/Interventions Self-care/ADL training;DME and/or AE instruction;Manual lymph drainage;Compression bandaging;Therapeutic activities;Therapeutic exercise;Other (comment);Manual Therapy;Patient/family education    Plan CDT to BLE: MLD, skin care, compression wraps and/ or garments, ther ex    Recommended Other Services replace existing custom, ccl 2 Elvarex flat knit knee high compression stockings    Consulted and Agree with Plan of Care Patient           Patient will benefit from skilled therapeutic intervention in order to improve  the following deficits and impairments:           Visit Diagnosis: Lymphedema, not elsewhere classified    Problem List Patient Active Problem List   Diagnosis Date Noted  . Positive colorectal cancer screening using Cologuard test   . Cecal polyp   . Polyp of sigmoid colon   . Paroxysmal atrial fibrillation ([place]) 10/02/2019  . Thoracic aortic ectasia ([place]) 08/31/2019  . Monitoring for anticoagulant use 01/06/2019  . Diverticulosis of colon 08/08/2018  . Cholelithiasis 08/08/2018  . Endometrial cancer ([place]) 04/10/2017  . 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 Chronic acquired lymphedema 01/08/2017  . Obesity, morbid, BMI 40.0-49.9 ([place]) 11/13/2016  . FIGO stage II endometrial cancer ([place]) 10/29/2016  . Left ventricular hypertrophy 11/27/2014  . Lymphedema of lower extremity 08/23/2014  . Severe aortic stenosis 08/21/2014  . Benign essential HTN 08/21/2014  . Depression, major, recurrent, mild ([place]) 08/21/2014  . Dyslipidemia 08/21/2014  . Cardiac dysrhythmia 08/21/2014  . Dysmetabolic syndrome 0000000  . Symptomatic menopausal or female climacteric states 08/21/2014  . 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 History of shingles 08/21/2014  . Personal history of skin cancer 11/24/2009    Andrey Spearman, MS, OTR/L, [facility] 03/17/20 5:00 PM  [place] MAIN [facility] SERVICES [address] [place], [place], [address] Phone: [phone]   Fax:  [phone]  Name: Melody Soto MRN: [id number] Date of Birth: [birth year]

## 2020-03-18 ENCOUNTER — Other Ambulatory Visit: Payer: Self-pay | Admitting: Family Medicine

## 2020-03-18 DIAGNOSIS — E785 Hyperlipidemia, unspecified: Secondary | ICD-10-CM

## 2020-03-18 MED ORDER — ATORVASTATIN CALCIUM 40 MG PO TABS: 40.0000 mg | ORAL_TABLET | ORAL | 0 refills | Status: DC

## 2020-03-21 ENCOUNTER — Ambulatory Visit: Payer: Medicare HMO | Admitting: Occupational Therapy

## 2020-03-24 ENCOUNTER — Other Ambulatory Visit: Payer: Self-pay

## 2020-03-24 ENCOUNTER — Ambulatory Visit: Payer: Medicare HMO | Admitting: Occupational Therapy

## 2020-03-24 DIAGNOSIS — I890 Lymphedema, not elsewhere classified: Secondary | ICD-10-CM

## 2020-03-24 NOTE — Therapy (Addendum)
[place] MAIN [facility] SERVICES [address] [place], [place], [address] Phone: [phone]   Fax:  [phone]  Occupational Therapy Treatment Note and Progress Report  Reporting period 1216/21 - 03/24/20  Patient Details  Name: Melody Soto MRN: [id number] Date of Birth: 02-13-1951 Referring Provider (OT): adolescence by report   Encounter Date: 03/24/2020   OT End of Session - 03/24/20 0916    Visit Number 10    Number of Visits 36    Date for OT Re-Evaluation 04/27/20    OT Start Time 0905    OT Stop Time 0958    OT Time Calculation (min) 53 min    Activity Tolerance Patient tolerated treatment well;No increased pain    Behavior During Therapy WFL for tasks assessed/performed           Past Medical History:  Diagnosis Date  . Aortic stenosis   . Cataract   . Depression   . Dysrhythmia   . Endometrial cancer determined by uterine biopsy ([place]) 10/29/2016   Rad tx's and internal brachytherapy.  Marland Kitchen Heart murmur    HX OF  . Hyperlipidemia   . Hypertension    CONTROLLED ON MEDS  . Lymphedema   . Shortness of breath dyspnea     Past Surgical History:  Procedure Laterality Date  . 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 CARDIAC CATHETERIZATION    . CARDIOVERSION  10/2019  . cataract surgery Bilateral 05/24/2014   second eye 06/07/2014  . COLONOSCOPY WITH PROPOFOL N/A 09/24/2019   Procedure: COLONOSCOPY WITH PROPOFOL;  Surgeon: [name], MD;  Location: [facility] ENDOSCOPY;  Service: Gastroenterology;  Laterality: N/A;  . COLONOSCOPY WITH PROPOFOL N/A 12/10/2019   Procedure: COLONOSCOPY WITH PROPOFOL;  Surgeon: [name], MD;  Location: [facility] ENDOSCOPY;  Service: Endoscopy;  Laterality: N/A;  . CORONARY ARTERY BYPASS GRAFT    . EYE SURGERY    . MELANOMA EXCISION    . OVARIAN CYST REMOVAL     EXPLORATORY LAPAROTOMY  . TONSILLECTOMY      There were no vitals filed for this visit.   Subjective  Assessment - 03/24/20 1250    Subjective  Deb McGowanpresents for OT Rx visit 10/36 to address BLE lipo-lymphedema. Pt denies LE related leg pain this morning. Custom garment has not yet arrived for fitting. Pt has no new concerns today. She agrees with plan to complete RLE comparative limb volumetrics today.    Pertinent History bilateral transcatheter aortic valve replacement 02/27/2017; melanoma excision; ovarian cyst removal; aortic stenosis, endometrial cancer 2018 w XRT and brachytherapy; HTN, SOB, BLE lipo-lymphedema    Limitations leg swelling and associated pain, decreased standing and walking tolerance >15 minutes    Repetition Increases Symptoms    Special Tests + Stemmer bolaterally    Patient Stated Goals reduce RLE ankle and foot swelling and keep it from gettin worse; replace worn outy custom compression garments.    Pain Onset Other (comment)   > 25 years              LYMPHEDEMA/ONCOLOGY QUESTIONNAIRE - 03/24/20 1411      Right Lower Extremity Lymphedema   Other RLE limb volume   ankle to tibial tuberosity (A-D) measures 5940.4 ml. RLE thigh volume (E-G) = 11067.4 RLE full imb volume ankle to groin (A-G) measures 16807.8 ml.    Other R leg (A-D) volume is INcreased by 1.07% ; R thigh (E-G)  is INcreased by 4.9%, and R full limb (  A-G) is increased by 2.3% overall, since commencing CDT on 01/18/20                   OT Treatments/Exercises (OP) - 03/24/20 1253      ADLs   ADL Education Given Yes      Manual Therapy   Manual Therapy Edema management;Compression Bandaging    Edema Management RLE comparative limb volumetrics    Compression Bandaging knee length, multi layer gradient compression wraps using 8,10, and 12 cm  wide short stretch wraps over single layer of 0.4 cm thick Rosidal foam and stockinett.                  OT Education - 03/24/20 0955    Education Details Continued Pt/ CG edu for lymphedema self care  and home program throughout  session. Topics include multilayer, gradient compression wrapping, simple self-MLD, therapeutic lymphatic pumping exercises, skin/nail care, risk reduction factors and LE precautions, compression garments/recommendations and wear and care schedule and compression garment donning / doffing using assistive devices, progress towards OT oals and plan of care going forward. All questions answered to the Pt's satisfaction, and Pt demonstrates understanding by report.    Person(s) Educated Patient    Methods Explanation;Demonstration    Comprehension Verbalized understanding;Returned demonstration               OT Long Term Goals - 03/24/20 1300      OT LONG TERM GOAL #1   Title Pt modified independent w/ lymphedema precautions/prevention principals and using printed reference to limit LE progression and infection risk.    Baseline min A    Time 4    Period Days   4th OT Rx visit   Status Achieved      OT LONG TERM GOAL #2   Title Lymphedema (LE) management/ self-care: Pt able to apply knee -length gradient compression wraps with extra time and printed reference PRN (modified independence) within 10 visits to achieve optimal limb volume reduction during Intensive Phase CDT.    Baseline Max A    Time 4    Period Days    Status Achieved      OT LONG TERM GOAL #3   Title Lymphedema (LE) management/ self-care:  Pt to achieve at least 5% limb volume reductions below R knees during Intensive Phase CDT to limit LE progression, to improve tissue integrity, to decrease infection risk and to improve functional arm and hand use essential for basic and instrumental ADLs performance.    Baseline Mod A    Time 12    Period Weeks    Status On-going   Unfrtunately not met when measured today. Volumes increased for RLE leg, thigh and full limb since commencing Rx on 01/28/20     OT LONG TERM GOAL #4   Title Lymphedema (LE) management/ self-care:  Pt to tolerate daily compression wraps, compression  garments and/ or HOS devices in keeping w/ prescribed wear regime within 1 week of issue date of each to progress and retain clinical and functional gains and to limit LE progression.    Baseline Max A    Time 12    Period Weeks    Status Achieved      OT LONG TERM GOAL #5   Title Given Pt's intake FOTO score of 49/100 , Pt will likely gain 7 points on functional scale by DC.    Baseline dependent    Time 12    Period Weeks  Status Achieved   Pt's intake FOTO score was 49/100.Pt's FOTO score on visit 10 measures 61/100. Increase . To date Pt reports an increase in percieved function of 12 points. This value meet goal predicting 7 pt increase in functional score.                Plan - 03/24/20 1424    Clinical Impression Statement Completed RLE comparative limb volumetrics to measure limb volume reduction since commencing OT for CDT. Unfortunately R leg (A-D) volume is INcreased by 1.07% ; R thigh (E-G)  is INcreased by 4.9%, and R full limb (A-G) is increased by 2.3% overall, despite compliance with home program and near 100% attendance at OT sessions. R leg is ,however, firmer with less palpable fluid congestion, less painful by report, nand FOTO functional assessment score is increased by 12 points since commencing OT for lympghedema care. Review aditional details re progress towards OT goals in Long Term Goals Section. Reapplied compression wraps after volumetrics and progress discussion. RLE knee length custom garment is ordered and we will fit as soon as it is available from vendor.Cont as per OC.    OT Occupational Profile and History Comprehensive Assessment- Review of records and extensive additional review of physical, cognitive, psychosocial history related to current functional performance    Occupational performance deficits (Please refer to evaluation for details): ADL's;IADL's;Work;Leisure;Social Participation;Other    Rehab Potential Good    Clinical Decision Making  Several treatment options, min-mod task modification necessary    Comorbidities Affecting Occupational Performance: Presence of comorbidities impacting occupational performance    Modification or Assistance to Complete Evaluation  Min-Moderate modification of tasks or assist with assess necessary to complete eval    OT Frequency 2x / week    OT Duration 12 weeks    OT Treatment/Interventions Self-care/ADL training;DME and/or AE instruction;Manual lymph drainage;Compression bandaging;Therapeutic activities;Therapeutic exercise;Other (comment);Manual Therapy;Patient/family education    Plan CDT to BLE: MLD, skin care, compression wraps and/ or garments, ther ex    Recommended Other Services replace existing custom, ccl 2 Elvarex flat knit knee high compression stockings    Consulted and Agree with Plan of Care Patient           Patient will benefit from skilled therapeutic intervention in order to improve the following deficits and impairments:           Visit Diagnosis: Lymphedema, not elsewhere classified    Problem List Patient Active Problem List   Diagnosis Date Noted  . Positive colorectal cancer screening using Cologuard test   . Cecal polyp   . Polyp of sigmoid colon   . Paroxysmal atrial fibrillation ([place]) 10/02/2019  . Thoracic aortic ectasia ([place]) 08/31/2019  . Monitoring for anticoagulant use 01/06/2019  . Diverticulosis of colon 08/08/2018  . Cholelithiasis 08/08/2018  . Endometrial cancer ([place]) 04/10/2017  . 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 Chronic acquired lymphedema 01/08/2017  . Obesity, morbid, BMI 40.0-49.9 ([place]) 11/13/2016  . FIGO stage II endometrial cancer ([place]) 10/29/2016  . Left ventricular hypertrophy 11/27/2014  . Lymphedema of lower extremity 08/23/2014  . Severe aortic stenosis 08/21/2014  . Benign essential HTN 08/21/2014  . Depression, major, recurrent, mild ([place]) 08/21/2014  . Dyslipidemia 08/21/2014  . Cardiac  dysrhythmia 08/21/2014  . Dysmetabolic syndrome 77/82/4235  . Symptomatic menopausal or female climacteric states 08/21/2014  . 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 History of shingles 08/21/2014  . Personal history of skin cancer 11/24/2009   Andrey Spearman, MS,  OTR/L, CLT-LANA 03/24/20 2:34 PM   [place] MAIN [facility] SERVICES [address] [place], [place], [address] Phone: [phone]   Fax:  [phone]  Name: SHIAN GOODNOW MRN: [id number] Date of Birth: February 01, 1951

## 2020-03-28 ENCOUNTER — Ambulatory Visit: Payer: Medicare HMO | Admitting: Occupational Therapy

## 2020-03-31 ENCOUNTER — Ambulatory Visit: Payer: Medicare HMO | Admitting: Occupational Therapy

## 2020-03-31 ENCOUNTER — Other Ambulatory Visit: Payer: Self-pay

## 2020-03-31 DIAGNOSIS — I10 Essential (primary) hypertension: Secondary | ICD-10-CM | POA: Diagnosis not present

## 2020-03-31 DIAGNOSIS — I519 Heart disease, unspecified: Secondary | ICD-10-CM | POA: Diagnosis not present

## 2020-03-31 DIAGNOSIS — I890 Lymphedema, not elsewhere classified: Secondary | ICD-10-CM

## 2020-03-31 DIAGNOSIS — I208 Other forms of angina pectoris: Secondary | ICD-10-CM | POA: Diagnosis not present

## 2020-03-31 DIAGNOSIS — I480 Paroxysmal atrial fibrillation: Secondary | ICD-10-CM | POA: Diagnosis not present

## 2020-03-31 DIAGNOSIS — Z952 Presence of prosthetic heart valve: Secondary | ICD-10-CM | POA: Diagnosis not present

## 2020-03-31 DIAGNOSIS — E782 Mixed hyperlipidemia: Secondary | ICD-10-CM | POA: Diagnosis not present

## 2020-03-31 DIAGNOSIS — E669 Obesity, unspecified: Secondary | ICD-10-CM | POA: Diagnosis not present

## 2020-03-31 NOTE — Therapy (Signed)
[place] MAIN [facility] SERVICES [address] [place], [place], [address] Phone: [phone]   Fax:  [phone]  Occupational Therapy Treatment  Patient Details  Name: Melody Soto MRN: [id number] Date of Birth: 06/28/1950 Referring Provider (OT): adolescence by report   Encounter Date: 03/31/2020   OT End of Session - 03/31/20 1112    Visit Number 11    Number of Visits 36    Date for OT Re-Evaluation 04/27/20    OT Start Time 1110    OT Stop Time 1210    OT Time Calculation (min) 60 min    Activity Tolerance Patient tolerated treatment well;No increased pain    Behavior During Therapy WFL for tasks assessed/performed           Past Medical History:  Diagnosis Date  . Aortic stenosis   . Cataract   . Depression   . Dysrhythmia   . Endometrial cancer determined by uterine biopsy ([place]) 10/29/2016   Rad tx's and internal brachytherapy.  Marland Kitchen Heart murmur    HX OF  . Hyperlipidemia   . Hypertension    CONTROLLED ON MEDS  . Lymphedema   . Shortness of breath dyspnea     Past Surgical History:  Procedure Laterality Date  . 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 CARDIAC CATHETERIZATION    . CARDIOVERSION  10/2019  . cataract surgery Bilateral 05/24/2014   second eye 06/07/2014  . COLONOSCOPY WITH PROPOFOL N/A 09/24/2019   Procedure: COLONOSCOPY WITH PROPOFOL;  Surgeon: [name], MD;  Location: [facility] ENDOSCOPY;  Service: Gastroenterology;  Laterality: N/A;  . COLONOSCOPY WITH PROPOFOL N/A 12/10/2019   Procedure: COLONOSCOPY WITH PROPOFOL;  Surgeon: [name], MD;  Location: [facility] ENDOSCOPY;  Service: Endoscopy;  Laterality: N/A;  . CORONARY ARTERY BYPASS GRAFT    . EYE SURGERY    . MELANOMA EXCISION    . OVARIAN CYST REMOVAL     EXPLORATORY LAPAROTOMY  . TONSILLECTOMY      There were no vitals filed for this visit.   Subjective Assessment - 03/31/20 1343    Subjective  Melody McGowanpresents  for OT Rx visit 11/36 to address BLE lipo-lymphedema. Pt denies LE related leg pain this morning. Pt denies LE related leg pain.    Pertinent History bilateral transcatheter aortic valve replacement 02/27/2017; melanoma excision; ovarian cyst removal; aortic stenosis, endometrial cancer 2018 w XRT and brachytherapy; HTN, SOB, BLE lipo-lymphedema    Limitations leg swelling and associated pain, decreased standing and walking tolerance >15 minutes    Repetition Increases Symptoms    Special Tests + Stemmer bolaterally    Patient Stated Goals reduce RLE ankle and foot swelling and keep it from gettin worse; replace worn outy custom compression garments.    Pain Onset Other (comment)   > 25 years                       OT Treatments/Exercises (OP) - 03/31/20 1344      ADLs   ADL Education Given Yes      Manual Therapy   Manual Therapy Edema management;Compression Bandaging    Manual therapy comments skin care with low ph castor oil throughout MLD to improve skin hydration and reduce infection risk    Manual Lymphatic Drainage (MLD) MLD to RLE utilizing short neck sequence, deep abdominal breathing, functional inguinal LN and leg strokes from proximal to distal . Fibrosis techniques to medial and lateral ankle.  Compression Bandaging knee length, multi layer gradient compression wraps using 8,10, and 12 cm  wide short stretch wraps over single layer of 0.4 cm thick Rosidal foam and stockinett.                  OT Education - 03/31/20 1346    Education Details Continued Pt/ CG edu for lymphedema self care  and home program throughout session. Topics include multilayer, gradient compression wrapping, simple self-MLD, therapeutic lymphatic pumping exercises, skin/nail care, risk reduction factors and LE precautions, compression garments/recommendations and wear and care schedule and compression garment donning / doffing using assistive devices, progress towards OT oals and plan  of care going forward. All questions answered to the Pt's satisfaction, and Pt demonstrates understanding by report.    Person(s) Educated Patient    Methods Explanation;Demonstration    Comprehension Verbalized understanding;Returned demonstration               OT Long Term Goals - 03/24/20 1300      OT LONG TERM GOAL #1   Title Pt modified independent w/ lymphedema precautions/prevention principals and using printed reference to limit LE progression and infection risk.    Baseline min A    Time 4    Period Days   4th OT Rx visit   Status Achieved      OT LONG TERM GOAL #2   Title Lymphedema (LE) management/ self-care: Pt able to apply knee -length gradient compression wraps with extra time and printed reference PRN (modified independence) within 10 visits to achieve optimal limb volume reduction during Intensive Phase CDT.    Baseline Max A    Time 4    Period Days    Status Achieved      OT LONG TERM GOAL #3   Title Lymphedema (LE) management/ self-care:  Pt to achieve at least 5% limb volume reductions below R knees during Intensive Phase CDT to limit LE progression, to improve tissue integrity, to decrease infection risk and to improve functional arm and hand use essential for basic and instrumental ADLs performance.    Baseline Mod A    Time 12    Period Weeks    Status On-going   Unfrtunately not met when measured today. Volumes increased for RLE leg, thigh and full limb since commencing Rx on 01/28/20     OT LONG TERM GOAL #4   Title Lymphedema (LE) management/ self-care:  Pt to tolerate daily compression wraps, compression garments and/ or HOS devices in keeping w/ prescribed wear regime within 1 week of issue date of each to progress and retain clinical and functional gains and to limit LE progression.    Baseline Max A    Time 12    Period Weeks    Status Achieved      OT LONG TERM GOAL #5   Title Given Pt's intake FOTO score of 49/100 , Pt will likely gain 7  points on functional scale by DC.    Baseline dependent    Time 12    Period Weeks    Status Achieved   Pt's intake FOTO score was 49/100.Pt's FOTO score on visit 10 measures 61/100. Increase . To date Pt reports an increase in percieved function of 12 points. This value meet goal predicting 7 pt increase in functional score.                Plan - 03/31/20 1347    Clinical Impression Statement Pt arrives without wraps in  place as she showered before session. RLE swelling with lipo-edematous fibrotic fat unchanged since last visit. MLD provided with skin care throughout session and wraps reapplied without increased pain. Pt managing well between sessions. Garmentas for RLE are ordered. Cont as per POC.    OT Occupational Profile and History Comprehensive Assessment- Review of records and extensive additional review of physical, cognitive, psychosocial history related to current functional performance    Occupational performance deficits (Please refer to evaluation for details): ADL's;IADL's;Work;Leisure;Social Participation;Other    Rehab Potential Good    Clinical Decision Making Several treatment options, min-mod task modification necessary    Comorbidities Affecting Occupational Performance: Presence of comorbidities impacting occupational performance    Modification or Assistance to Complete Evaluation  Min-Moderate modification of tasks or assist with assess necessary to complete eval    OT Frequency 2x / week    OT Duration 12 weeks    OT Treatment/Interventions Self-care/ADL training;DME and/or AE instruction;Manual lymph drainage;Compression bandaging;Therapeutic activities;Therapeutic exercise;Other (comment);Manual Therapy;Patient/family education    Plan CDT to BLE: MLD, skin care, compression wraps and/ or garments, ther ex    Recommended Other Services replace existing custom, ccl 2 Elvarex flat knit knee high compression stockings    Consulted and Agree with Plan of Care  Patient           Patient will benefit from skilled therapeutic intervention in order to improve the following deficits and impairments:           Visit Diagnosis: Lymphedema, not elsewhere classified    Problem List Patient Active Problem List   Diagnosis Date Noted  . Positive colorectal cancer screening using Cologuard test   . Cecal polyp   . Polyp of sigmoid colon   . Paroxysmal atrial fibrillation ([place]) 10/02/2019  . Thoracic aortic ectasia ([place]) 08/31/2019  . Monitoring for anticoagulant use 01/06/2019  . Diverticulosis of colon 08/08/2018  . Cholelithiasis 08/08/2018  . Endometrial cancer ([place]) 04/10/2017  . 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 Chronic acquired lymphedema 01/08/2017  . Obesity, morbid, BMI 40.0-49.9 ([place]) 11/13/2016  . FIGO stage II endometrial cancer ([place]) 10/29/2016  . Left ventricular hypertrophy 11/27/2014  . Lymphedema of lower extremity 08/23/2014  . Severe aortic stenosis 08/21/2014  . Benign essential HTN 08/21/2014  . Depression, major, recurrent, mild ([place]) 08/21/2014  . Dyslipidemia 08/21/2014  . Cardiac dysrhythmia 08/21/2014  . Dysmetabolic syndrome 97/47/1855  . Symptomatic menopausal or female climacteric states 08/21/2014  . 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 History of shingles 08/21/2014  . Personal history of skin cancer 11/24/2009   Andrey Spearman, MS, OTR/L, [facility] 03/31/20 2:08 PM   [place] MAIN [facility] SERVICES [address] [place], [place], [address] Phone: [phone]   Fax:  [phone]  Name: Melody Soto MRN: [id number] Date of Birth: 26-Dec-1950

## 2020-04-04 ENCOUNTER — Ambulatory Visit: Payer: Medicare HMO | Admitting: Occupational Therapy

## 2020-04-07 ENCOUNTER — Other Ambulatory Visit: Payer: Self-pay

## 2020-04-07 ENCOUNTER — Ambulatory Visit: Payer: Medicare HMO | Admitting: Occupational Therapy

## 2020-04-07 DIAGNOSIS — I890 Lymphedema, not elsewhere classified: Secondary | ICD-10-CM

## 2020-04-07 NOTE — Therapy (Signed)
[place] MAIN [facility] SERVICES [address] [place], [place], [address] Phone: [phone]   Fax:  [phone]  Occupational Therapy Treatment  Patient Details  Name: Melody Soto MRN: [id number] Date of Birth: [birth year] Referring Provider (OT): adolescence by report   Encounter Date: 04/07/2020   OT End of Session - 04/07/20 0919    Visit Number 12    Number of Visits 36    Date for OT Re-Evaluation 04/27/20    OT Start Time 0905    OT Stop Time 1010    OT Time Calculation (min) 65 min    Activity Tolerance Patient tolerated treatment well;No increased pain    Behavior During Therapy WFL for tasks assessed/performed           Past Medical History:  Diagnosis Date  . Aortic stenosis   . Cataract   . Depression   . Dysrhythmia   . Endometrial cancer determined by uterine biopsy ([place]) 10/29/2016   Rad tx's and internal brachytherapy.  Marland Kitchen Heart murmur    HX OF  . Hyperlipidemia   . Hypertension    CONTROLLED ON MEDS  . Lymphedema   . Shortness of breath dyspnea     Past Surgical History:  Procedure Laterality Date  . 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 CARDIAC CATHETERIZATION    . CARDIOVERSION  10/2019  . cataract surgery Bilateral 05/24/2014   second eye 06/07/2014  . COLONOSCOPY WITH PROPOFOL N/A 09/24/2019   Procedure: COLONOSCOPY WITH PROPOFOL;  Surgeon: [name], MD;  Location: [facility] ENDOSCOPY;  Service: Gastroenterology;  Laterality: N/A;  . COLONOSCOPY WITH PROPOFOL N/A 12/10/2019   Procedure: COLONOSCOPY WITH PROPOFOL;  Surgeon: [name], MD;  Location: [facility] ENDOSCOPY;  Service: Endoscopy;  Laterality: N/A;  . CORONARY ARTERY BYPASS GRAFT    . EYE SURGERY    . MELANOMA EXCISION    . OVARIAN CYST REMOVAL     EXPLORATORY LAPAROTOMY  . TONSILLECTOMY      There were no vitals filed for this visit.   Subjective Assessment - 04/07/20 0923    Subjective  Melody McGowanpresents  for OT Rx visit 12/36 to address BLE lipo-lymphedema. Pt denies LE related leg pain this morning, but c/o pain in area of R achlles insertion. "It's been hurting for months. I've had this for years."    Pertinent History bilateral transcatheter aortic valve replacement 02/27/2017; melanoma excision; ovarian cyst removal; aortic stenosis, endometrial cancer 2018 w XRT and brachytherapy; HTN, SOB, BLE lipo-lymphedema    Limitations leg swelling and associated pain, decreased standing and walking tolerance >15 minutes    Repetition Increases Symptoms    Special Tests + Stemmer bolaterally    Patient Stated Goals reduce RLE ankle and foot swelling and keep it from gettin worse; replace worn outy custom compression garments.    Pain Onset Other (comment)   > 25 years                       OT Treatments/Exercises (OP) - 04/07/20 2409      ADLs   ADL Education Given Yes      Manual Therapy   Manual Therapy Edema management;Compression Bandaging    Manual therapy comments skin care with low ph castor oil throughout MLD to improve skin hydration and reduce infection risk    Manual Lymphatic Drainage (MLD) MLD to RLE utilizing short neck sequence, deep abdominal breathing, functional inguinal LN and leg strokes from  proximal to distal . Fibrosis techniques to medial and lateral ankle.    Compression Bandaging knee length, multi layer gradient compression wraps using 8,10, and 12 cm  wide short stretch wraps over single layer of 0.4 cm thick Rosidal foam and stockinett.    Kinesiotex --   achilles tendonitis                 OT Education - 04/07/20 1049    Education Details Continued skilled Pt/caregiver education  And LE ADL training throughout visit for lymphedema self care/ home program, including compression wrapping, compression garment and device wear/care, lymphatic pumping ther ex, simple self-MLD, and skin care. Discussed progress towards goals.    Person(s) Educated  Patient    Methods Explanation;Demonstration    Comprehension Verbalized understanding;Returned demonstration               OT Long Term Goals - 03/24/20 1300      OT LONG TERM GOAL #1   Title Pt modified independent w/ lymphedema precautions/prevention principals and using printed reference to limit LE progression and infection risk.    Baseline min A    Time 4    Period Days   4th OT Rx visit   Status Achieved      OT LONG TERM GOAL #2   Title Lymphedema (LE) management/ self-care: Pt able to apply knee -length gradient compression wraps with extra time and printed reference PRN (modified independence) within 10 visits to achieve optimal limb volume reduction during Intensive Phase CDT.    Baseline Max A    Time 4    Period Days    Status Achieved      OT LONG TERM GOAL #3   Title Lymphedema (LE) management/ self-care:  Pt to achieve at least 5% limb volume reductions below R knees during Intensive Phase CDT to limit LE progression, to improve tissue integrity, to decrease infection risk and to improve functional arm and hand use essential for basic and instrumental ADLs performance.    Baseline Mod A    Time 12    Period Weeks    Status On-going   Unfrtunately not met when measured today. Volumes increased for RLE leg, thigh and full limb since commencing Rx on 01/28/20     OT LONG TERM GOAL #4   Title Lymphedema (LE) management/ self-care:  Pt to tolerate daily compression wraps, compression garments and/ or HOS devices in keeping w/ prescribed wear regime within 1 week of issue date of each to progress and retain clinical and functional gains and to limit LE progression.    Baseline Max A    Time 12    Period Weeks    Status Achieved      OT LONG TERM GOAL #5   Title Given Pt's intake FOTO score of 49/100 , Pt will likely gain 7 points on functional scale by DC.    Baseline dependent    Time 12    Period Weeks    Status Achieved   Pt's intake FOTO score was  49/100.Pt's FOTO score on visit 10 measures 61/100. Increase . To date Pt reports an increase in percieved function of 12 points. This value meet goal predicting 7 pt increase in functional score.                Plan - 04/07/20 1050    Clinical Impression Statement Pt tolerated RLE MLD, skin care and gradient compression wrap to LLE below the knee without increased ain.  Swelling reduction appears to have reached a clinical plateau. Applied kinesiotape to R achilles insertion anchoring 2" piece of tape just forward of the heel, strtching tape ~ 50%, thennanchoring tape at achilles origine with 0 stretch on anchor. Applied a 2nd and 3rd 2" pieces over painful insertion and another above perpendicular to the first verticaL tape strip. Cont as per POC until RLE custom compression garment is fitted, then shift intensive phase CDT to LLE .    OT Occupational Profile and History Comprehensive Assessment- Review of records and extensive additional review of physical, cognitive, psychosocial history related to current functional performance    Occupational performance deficits (Please refer to evaluation for details): ADL's;IADL's;Work;Leisure;Social Participation;Other    Rehab Potential Good    Clinical Decision Making Several treatment options, min-mod task modification necessary    Comorbidities Affecting Occupational Performance: Presence of comorbidities impacting occupational performance    Modification or Assistance to Complete Evaluation  Min-Moderate modification of tasks or assist with assess necessary to complete eval    OT Frequency 2x / week    OT Duration 12 weeks    OT Treatment/Interventions Self-care/ADL training;DME and/or AE instruction;Manual lymph drainage;Compression bandaging;Therapeutic activities;Therapeutic exercise;Other (comment);Manual Therapy;Patient/family education    Plan CDT to BLE: MLD, skin care, compression wraps and/ or garments, ther ex    Recommended Other  Services replace existing custom, ccl 2 Elvarex flat knit knee high compression stockings    Consulted and Agree with Plan of Care Patient           Patient will benefit from skilled therapeutic intervention in order to improve the following deficits and impairments:           Visit Diagnosis: Lymphedema, not elsewhere classified    Problem List Patient Active Problem List   Diagnosis Date Noted  . Positive colorectal cancer screening using Cologuard test   . Cecal polyp   . Polyp of sigmoid colon   . Paroxysmal atrial fibrillation ([place]) 10/02/2019  . Thoracic aortic ectasia ([place]) 08/31/2019  . Monitoring for anticoagulant use 01/06/2019  . Diverticulosis of colon 08/08/2018  . Cholelithiasis 08/08/2018  . Endometrial cancer ([place]) 04/10/2017  . 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 Chronic acquired lymphedema 01/08/2017  . Obesity, morbid, BMI 40.0-49.9 ([place]) 11/13/2016  . FIGO stage II endometrial cancer ([place]) 10/29/2016  . Left ventricular hypertrophy 11/27/2014  . Lymphedema of lower extremity 08/23/2014  . Severe aortic stenosis 08/21/2014  . Benign essential HTN 08/21/2014  . Depression, major, recurrent, mild ([place]) 08/21/2014  . Dyslipidemia 08/21/2014  . Cardiac dysrhythmia 08/21/2014  . Dysmetabolic syndrome 33/61/2244  . Symptomatic menopausal or female climacteric states 08/21/2014  . 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 History of shingles 08/21/2014  . Personal history of skin cancer 11/24/2009    Andrey Spearman, MS, OTR/L, [facility] 04/07/20 11:01 AM   [place] MAIN [facility] SERVICES [address] [place], [place], [address] Phone: [phone]   Fax:  [phone]  Name: Melody Soto MRN: [id number] Date of Birth: Dec 01, 1950

## 2020-04-11 ENCOUNTER — Ambulatory Visit: Payer: Self-pay

## 2020-04-11 DIAGNOSIS — I10 Essential (primary) hypertension: Secondary | ICD-10-CM

## 2020-04-11 DIAGNOSIS — I480 Paroxysmal atrial fibrillation: Secondary | ICD-10-CM

## 2020-04-11 DIAGNOSIS — E785 Hyperlipidemia, unspecified: Secondary | ICD-10-CM

## 2020-04-11 NOTE — Chronic Care Management (AMB) (Signed)
Chronic Care Management   Follow Up Note   04/11/2020 Name: Melody Soto MRN: [id number] DOB: [birth year]  Primary Care Provider: Steele Sizer, MD Reason for referral : Chronic Care Management   Melody Soto is a 70 y.o. year old female who is a primary care patient of Melody Sizer, MD. A routine telephonic outreach was conducted today. She has met her care management goals.  Review of Melody Soto's status, including review of consultants reports, relevant labs and test results was conducted today. Collaboration with appropriate care team members was performed as part of the comprehensive evaluation and provision of chronic care management services.    SDOH (Social Determinants of Health) assessments performed: No    Outpatient Encounter Medications as of 04/11/2020  Medication Sig  . Ascorbic Acid (VITAMIN C) 500 MG CAPS Take 1,000 mg by mouth in the morning and at bedtime.  Marland Kitchen atorvastatin (LIPITOR) 40 MG tablet Take 1 tablet (40 mg total) by mouth 3 (three) times a week.  . B Complex Vitamins (B COMPLEX 1 PO) Take by mouth.  . Cholecalciferol (VITAMIN D-3) 125 MCG (5000 UT) TABS Take 3 tablets by mouth daily.  Marland Kitchen dofetilide (TIKOSYN) 125 MCG capsule Take by mouth.  Marland Kitchen JARDIANCE 10 MG TABS tablet Take 10 mg by mouth daily.  . Magnesium 500 MG CAPS Take 800 mg by mouth in the morning and at bedtime.   . metoprolol succinate (TOPROL-XL) 50 MG 24 hr tablet Take 50 mg by mouth 2 (two) times daily.  Marland Kitchen spironolactone (ALDACTONE) 25 MG tablet Take 25 mg by mouth 2 (two) times daily.  Marland Kitchen telmisartan (MICARDIS) 40 MG tablet Take 1 tablet (40 mg total) by mouth daily.  Marland Kitchen warfarin (COUMADIN) 5 MG tablet Take 2 tablets by mouth daily. Pt takes 2 tabs (10 mg) daily and 3 tabs (15 mg) on Wednesdays   No facility-administered encounter medications on file as of 04/11/2020.     Objective:  Goals Addressed            This Visit's Progress   . COMPLETED: Chronic Disease Management        CARE PLAN ENTRY (see longitudinal plan of care for additional care plan information)  Current Barriers:  . Chronic Disease Management support and education needs related to HTN, Dyslipidemia and Chronic Acquired Lymphedema. (Hx of Endometrial Cancer)  Case Manager Clinical Goal(s):  Over the next 120 days, patient will: . Continue to take all medications as prescribed.-Complete . Attend all medical appointments as scheduled.-Complete . Monitor blood pressure and record readings.-Complete . Continue to monitor weight and nutritional intake.-Complete . Continue to follow recommended safety measures to prevent falls and injuries.-Complete   Interventions:  . Inter-disciplinary care team collaboration (see longitudinal plan of care) . Discussed care management needs. Melody Soto has met her care management goals. Reports doing very well. She reports recently completing outreach with Cardiology team. No c/o chest pain, palpitations or shortness of breath. Currently taking Eliquis. No concerns regarding medication tolerance or prescription cost. She is aware of bleeding precautions and indications for seeking immediate medical attention. She has returned to work. Reports significant improvements with her diet and activity tolerance. She is compliant with the plan for Lymphedema management. Reports completing Occupational Therapy sessions as scheduled. Wearing compression hose as instructed. She remains very motivated to improve her overall health. Declines need for additional services or referrals. No concerns or changes in care management needs. She agreed to call if additional outreach is needed.  Patient Self Care Activities:  . Self administers medications  . Attends scheduled provider appointments . Calls pharmacy for medication refills . Performs ADL's independently . Performs IADL's independently . Calls provider office for new concerns or questions   Please see past updates  related to this goal by clicking on the "Past Updates" button in the selected goal            PLAN Melody Soto has met her care management goals. She agreed to call if additional outreach is needed. The care management team will gladly assist.    [facility] Care [facility] [phone]

## 2020-04-13 ENCOUNTER — Encounter: Payer: Medicare HMO | Admitting: Occupational Therapy

## 2020-04-14 DIAGNOSIS — C541 Malignant neoplasm of endometrium: Secondary | ICD-10-CM | POA: Diagnosis not present

## 2020-04-14 DIAGNOSIS — I890 Lymphedema, not elsewhere classified: Secondary | ICD-10-CM | POA: Diagnosis not present

## 2020-04-19 ENCOUNTER — Ambulatory Visit: Payer: Medicare HMO | Attending: Obstetrics and Gynecology | Admitting: Occupational Therapy

## 2020-04-19 DIAGNOSIS — I890 Lymphedema, not elsewhere classified: Secondary | ICD-10-CM | POA: Diagnosis not present

## 2020-04-19 NOTE — Therapy (Signed)
[place] MAIN [facility] SERVICES [address] [place], [place], [address] Phone: [phone]   Fax:  [phone]  Occupational Therapy Treatment  Patient Details  Name: Melody Soto MRN: [id number] Date of Birth: [birth year] Referring Provider (OT): adolescence by report   Encounter Date: 04/19/2020   OT End of Session - 04/19/20 1423    Visit Number 13    Number of Visits 36    Date for OT Re-Evaluation 04/27/20    OT Start Time 1110    OT Stop Time 1215    OT Time Calculation (min) 65 min    Activity Tolerance Patient tolerated treatment well;No increased pain    Behavior During Therapy WFL for tasks assessed/performed           Past Medical History:  Diagnosis Date  . Aortic stenosis   . Cataract   . Depression   . Dysrhythmia   . Endometrial cancer determined by uterine biopsy ([place]) 10/29/2016   Rad tx's and internal brachytherapy.  Marland Kitchen Heart murmur    HX OF  . Hyperlipidemia   . Hypertension    CONTROLLED ON MEDS  . Lymphedema   . Shortness of breath dyspnea     Past Surgical History:  Procedure Laterality Date  . 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 CARDIAC CATHETERIZATION    . CARDIOVERSION  10/2019  . cataract surgery Bilateral 05/24/2014   second eye 06/07/2014  . COLONOSCOPY WITH PROPOFOL N/A 09/24/2019   Procedure: COLONOSCOPY WITH PROPOFOL;  Surgeon: [name], MD;  Location: [facility] ENDOSCOPY;  Service: Gastroenterology;  Laterality: N/A;  . COLONOSCOPY WITH PROPOFOL N/A 12/10/2019   Procedure: COLONOSCOPY WITH PROPOFOL;  Surgeon: [name], MD;  Location: [facility] ENDOSCOPY;  Service: Endoscopy;  Laterality: N/A;  . CORONARY ARTERY BYPASS GRAFT    . EYE SURGERY    . MELANOMA EXCISION    . OVARIAN CYST REMOVAL     EXPLORATORY LAPAROTOMY  . TONSILLECTOMY      There were no vitals filed for this visit.   Subjective Assessment - 04/19/20 1426    Subjective  Melody McGowanpresents  for OT Rx visit 13/36 to address BLE lipo-lymphedema. Pt denies LE related leg pain. RLE custom compression knee high and HOS device arrived from DME provider and we'll complete initial fitting today.    Pertinent History bilateral transcatheter aortic valve replacement 02/27/2017; melanoma excision; ovarian cyst removal; aortic stenosis, endometrial cancer 2018 w XRT and brachytherapy; HTN, SOB, BLE lipo-lymphedema    Limitations leg swelling and associated pain, decreased standing and walking tolerance >15 minutes    Repetition Increases Symptoms    Special Tests + Stemmer bolaterally    Patient Stated Goals reduce RLE ankle and foot swelling and keep it from gettin worse; replace worn outy custom compression garments.    Pain Onset Other (comment)   > 25 years                       OT Treatments/Exercises (OP) - 04/19/20 1427      ADLs   ADL Education Given Yes      Manual Therapy   Manual Therapy Edema management;Compression Bandaging;Manual Lymphatic Drainage (MLD);Other (comment)    Manual therapy comments skin care to LLE throughout MLD with low ph castor oil throughout MLD to improve skin hydration and reduce infection risk    Manual Lymphatic Drainage (MLD) MLD to LLE utilizing short neck sequence, deep abdominal breathing, functional inguinal  LN and leg strokes from proximal to distal . Fibrosis techniques to medial and lateral ankle.    Compression Bandaging . RLE custom compression garment fitting and fitting for HOS device for RLELLE knee length, multi layer gradient compression wraps using 8,10, and 12 cm  wide short stretch wraps over single layer of 0.4 cm thick Rosidal foam and stockinett.    Kinesiotex --   achilles tendonitis                 OT Education - 04/19/20 1429    Education Details Reviewed doning/ doffing compression devices and garments using assistive devices. Pt modified independent after skilled teaching. Goal Met.    Person(s) Educated  Patient    Methods Explanation;Demonstration    Comprehension Verbalized understanding;Returned demonstration               OT Long Term Goals - 03/24/20 1300      OT LONG TERM GOAL #1   Title Pt modified independent w/ lymphedema precautions/prevention principals and using printed reference to limit LE progression and infection risk.    Baseline min A    Time 4    Period Days   4th OT Rx visit   Status Achieved      OT LONG TERM GOAL #2   Title Lymphedema (LE) management/ self-care: Pt able to apply knee -length gradient compression wraps with extra time and printed reference PRN (modified independence) within 10 visits to achieve optimal limb volume reduction during Intensive Phase CDT.    Baseline Max A    Time 4    Period Days    Status Achieved      OT LONG TERM GOAL #3   Title Lymphedema (LE) management/ self-care:  Pt to achieve at least 5% limb volume reductions below R knees during Intensive Phase CDT to limit LE progression, to improve tissue integrity, to decrease infection risk and to improve functional arm and hand use essential for basic and instrumental ADLs performance.    Baseline Mod A    Time 12    Period Weeks    Status On-going   Unfrtunately not met when measured today. Volumes increased for RLE leg, thigh and full limb since commencing Rx on 01/28/20     OT LONG TERM GOAL #4   Title Lymphedema (LE) management/ self-care:  Pt to tolerate daily compression wraps, compression garments and/ or HOS devices in keeping w/ prescribed wear regime within 1 week of issue date of each to progress and retain clinical and functional gains and to limit LE progression.    Baseline Max A    Time 12    Period Weeks    Status Achieved      OT LONG TERM GOAL #5   Title Given Pt's intake FOTO score of 49/100 , Pt will likely gain 7 points on functional scale by DC.    Baseline dependent    Time 12    Period Weeks    Status Achieved   Pt's intake FOTO score was  49/100.Pt's FOTO score on visit 10 measures 61/100. Increase . To date Pt reports an increase in percieved function of 12 points. This value meet goal predicting 7 pt increase in functional score.                Plan - 04/19/20 1423    Clinical Impression Statement Completed initial fitting for RLE custom Elvarex knee high and Jobst RELAX HOS compression garment and device. Pt pleased with  comfort of Elvarex and fit appears good with appropriate containment. mPt will wash and wear and report any issues next visit. Initial Jobst RELAX appears 2-3 cm too long at top edge. Pt will use nightly until next visit next week and report issues so we can get remake or adjustments PRN. Commenced CDT to LLE today after fitting. Pt tolerated without increased pain. Cont as per POC.    OT Occupational Profile and History Comprehensive Assessment- Review of records and extensive additional review of physical, cognitive, psychosocial history related to current functional performance    Occupational performance deficits (Please refer to evaluation for details): ADL's;IADL's;Work;Leisure;Social Participation;Other    Rehab Potential Good    Clinical Decision Making Several treatment options, min-mod task modification necessary    Comorbidities Affecting Occupational Performance: Presence of comorbidities impacting occupational performance    Modification or Assistance to Complete Evaluation  Min-Moderate modification of tasks or assist with assess necessary to complete eval    OT Frequency 2x / week    OT Duration 12 weeks    OT Treatment/Interventions Self-care/ADL training;DME and/or AE instruction;Manual lymph drainage;Compression bandaging;Therapeutic activities;Therapeutic exercise;Other (comment);Manual Therapy;Patient/family education    Plan CDT to BLE: MLD, skin care, compression wraps and/ or garments, ther ex    Recommended Other Services replace existing custom, ccl 2 Elvarex flat knit knee high  compression stockings    Consulted and Agree with Plan of Care Patient           Patient will benefit from skilled therapeutic intervention in order to improve the following deficits and impairments:           Visit Diagnosis: Lymphedema, not elsewhere classified    Problem List Patient Active Problem List   Diagnosis Date Noted  . Positive colorectal cancer screening using Cologuard test   . Cecal polyp   . Polyp of sigmoid colon   . Paroxysmal atrial fibrillation ([place]) 10/02/2019  . Thoracic aortic ectasia ([place]) 08/31/2019  . Monitoring for anticoagulant use 01/06/2019  . Diverticulosis of colon 08/08/2018  . Cholelithiasis 08/08/2018  . Endometrial cancer ([place]) 04/10/2017  . 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 Chronic acquired lymphedema 01/08/2017  . Obesity, morbid, BMI 40.0-49.9 ([place]) 11/13/2016  . FIGO stage II endometrial cancer ([place]) 10/29/2016  . Left ventricular hypertrophy 11/27/2014  . Lymphedema of lower extremity 08/23/2014  . Severe aortic stenosis 08/21/2014  . Benign essential HTN 08/21/2014  . Depression, major, recurrent, mild ([place]) 08/21/2014  . Dyslipidemia 08/21/2014  . Cardiac dysrhythmia 08/21/2014  . Dysmetabolic syndrome 49/44/9675  . Symptomatic menopausal or female climacteric states 08/21/2014  . 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 History of shingles 08/21/2014  . Personal history of skin cancer 11/24/2009   Andrey Spearman, MS, OTR/L, [facility] 04/19/20 2:31 PM   [place] MAIN [facility] SERVICES [address] [place], [place], [address] Phone: [phone]   Fax:  [phone]  Name: Melody Soto MRN: [id number] Date of Birth: 1950/05/30

## 2020-04-22 NOTE — Patient Instructions (Signed)
Thank you for allowing the Chronic Care Management team to participate in your care.      Goals Addressed            This Visit's Progress   . COMPLETED: Chronic Disease Management       CARE PLAN ENTRY (see longitudinal plan of care for additional care plan information)  Current Barriers:  . Chronic Disease Management support and education needs related to HTN, Dyslipidemia and Chronic Acquired Lymphedema. (Hx of Endometrial Cancer)  Case Manager Clinical Goal(s):  Over the next 120 days, patient will: . Continue to take all medications as prescribed.-Complete . Attend all medical appointments as scheduled.-Complete . Monitor blood pressure and record readings.-Complete . Continue to monitor weight and nutritional intake.-Complete . Continue to follow recommended safety measures to prevent falls and injuries.-Complete   Interventions:  . Inter-disciplinary care team collaboration (see longitudinal plan of care) . Discussed care management needs. Melody Soto has met her care management goals. Reports doing very well. She reports recently completing outreach with Cardiology team. No c/o chest pain, palpitations or shortness of breath. Currently taking Eliquis. No concerns regarding medication tolerance or prescription cost. She is aware of bleeding precautions and indications for seeking immediate medical attention. She has returned to work. Reports significant improvements with her diet and activity tolerance. She is compliant with the plan for Lymphedema management. Reports completing Occupational Therapy sessions as scheduled. Wearing compression hose as instructed. She remains very motivated to improve her overall health. Declines need for additional services or referrals. No concerns or changes in care management needs. She agreed to call if additional outreach is needed.    Patient Self Care Activities:  . Self administers medications  . Attends scheduled provider  appointments . Calls pharmacy for medication refills . Performs ADL's independently . Performs IADL's independently . Calls provider office for new concerns or questions   Please see past updates related to this goal by clicking on the "Past Updates" button in the selected goal               Melody Soto has met her care management goals. She verbalized understanding of the information discussed during the telephonic outreach today. Declined need for mailed/printed instructions. She agreed to call if additional outreach is needed. The care management team will gladly assist.     Horris Latino [facility] Care Management [facility] [phone]

## 2020-04-26 ENCOUNTER — Other Ambulatory Visit: Payer: Self-pay

## 2020-04-26 ENCOUNTER — Ambulatory Visit: Payer: Medicare HMO | Admitting: Occupational Therapy

## 2020-04-26 DIAGNOSIS — I890 Lymphedema, not elsewhere classified: Secondary | ICD-10-CM | POA: Diagnosis not present

## 2020-04-26 NOTE — Therapy (Signed)
[place] MAIN [facility] SERVICES [address] [place], [place], [address] Phone: [phone]   Fax:  [phone]  Occupational Therapy Treatment  Patient Details  Name: Melody Soto MRN: [id number] Date of Birth: Mar 23, 1950 Referring Provider (OT): adolescence by report   Encounter Date: 04/26/2020   OT End of Session - 04/26/20 1357    Visit Number 14    Number of Visits 36    Date for OT Re-Evaluation 04/27/20    OT Start Time 1100    OT Stop Time 1200    OT Time Calculation (min) 60 min    Activity Tolerance Patient tolerated treatment well;No increased pain    Behavior During Therapy WFL for tasks assessed/performed           Past Medical History:  Diagnosis Date  . Aortic stenosis   . Cataract   . Depression   . Dysrhythmia   . Endometrial cancer determined by uterine biopsy ([place]) 10/29/2016   Rad tx's and internal brachytherapy.  Marland Kitchen Heart murmur    HX OF  . Hyperlipidemia   . Hypertension    CONTROLLED ON MEDS  . Lymphedema   . Shortness of breath dyspnea     Past Surgical History:  Procedure Laterality Date  . 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 CARDIAC CATHETERIZATION    . CARDIOVERSION  10/2019  . cataract surgery Bilateral 05/24/2014   second eye 06/07/2014  . COLONOSCOPY WITH PROPOFOL N/A 09/24/2019   Procedure: COLONOSCOPY WITH PROPOFOL;  Surgeon: [name], MD;  Location: [facility] ENDOSCOPY;  Service: Gastroenterology;  Laterality: N/A;  . COLONOSCOPY WITH PROPOFOL N/A 12/10/2019   Procedure: COLONOSCOPY WITH PROPOFOL;  Surgeon: [name], MD;  Location: [facility] ENDOSCOPY;  Service: Endoscopy;  Laterality: N/A;  . CORONARY ARTERY BYPASS GRAFT    . EYE SURGERY    . MELANOMA EXCISION    . OVARIAN CYST REMOVAL     EXPLORATORY LAPAROTOMY  . TONSILLECTOMY      There were no vitals filed for this visit.   Subjective Assessment - 04/26/20 1400    Subjective  Melody McGowanpresents  for OT Rx visit 14/36 to address BLE lipo-lymphedema. Pt denies RLE LE related leg pain. Pt reports she is pleased with new garment. It is comfortable and appears to be controlling swelling very well.    Pertinent History bilateral transcatheter aortic valve replacement 02/27/2017; melanoma excision; ovarian cyst removal; aortic stenosis, endometrial cancer 2018 w XRT and brachytherapy; HTN, SOB, BLE lipo-lymphedema    Limitations leg swelling and associated pain, decreased standing and walking tolerance >15 minutes    Repetition Increases Symptoms    Special Tests + Stemmer bolaterally    Patient Stated Goals reduce RLE ankle and foot swelling and keep it from gettin worse; replace worn outy custom compression garments.    Pain Onset Other (comment)   > 25 years                       OT Treatments/Exercises (OP) - 04/26/20 1401      ADLs   ADL Education Given Yes      Manual Therapy   Manual Therapy Edema management;Compression Bandaging;Manual Lymphatic Drainage (MLD);Other (comment)    Manual therapy comments skin care to LLE throughout MLD with low ph castor oil throughout MLD to improve skin hydration and reduce infection risk    Manual Lymphatic Drainage (MLD) MLD to LLE utilizing short neck sequence, deep abdominal breathing,  functional inguinal LN and leg strokes from proximal to distal . Fibrosis techniques to medial and lateral ankle.    Compression Bandaging LLE gradient compression wraps as established- knee length    Kinesiotex --   achilles tendonitis                 OT Education - 04/26/20 1402    Education Details Continued skilled Pt/caregiver education  And LE ADL training throughout visit for lymphedema self care/ home program, including compression wrapping, compression garment and device wear/care, lymphatic pumping ther ex, simple self-MLD, and skin care. Discussed progress towards goals.    Person(s) Educated Patient    Methods  Explanation;Demonstration    Comprehension Verbalized understanding;Returned demonstration               OT Long Term Goals - 03/24/20 1300      OT LONG TERM GOAL #1   Title Pt modified independent w/ lymphedema precautions/prevention principals and using printed reference to limit LE progression and infection risk.    Baseline min A    Time 4    Period Days   4th OT Rx visit   Status Achieved      OT LONG TERM GOAL #2   Title Lymphedema (LE) management/ self-care: Pt able to apply knee -length gradient compression wraps with extra time and printed reference PRN (modified independence) within 10 visits to achieve optimal limb volume reduction during Intensive Phase CDT.    Baseline Max A    Time 4    Period Days    Status Achieved      OT LONG TERM GOAL #3   Title Lymphedema (LE) management/ self-care:  Pt to achieve at least 5% limb volume reductions below R knees during Intensive Phase CDT to limit LE progression, to improve tissue integrity, to decrease infection risk and to improve functional arm and hand use essential for basic and instrumental ADLs performance.    Baseline Mod A    Time 12    Period Weeks    Status On-going   Unfrtunately not met when measured today. Volumes increased for RLE leg, thigh and full limb since commencing Rx on 01/28/20     OT LONG TERM GOAL #4   Title Lymphedema (LE) management/ self-care:  Pt to tolerate daily compression wraps, compression garments and/ or HOS devices in keeping w/ prescribed wear regime within 1 week of issue date of each to progress and retain clinical and functional gains and to limit LE progression.    Baseline Max A    Time 12    Period Weeks    Status Achieved      OT LONG TERM GOAL #5   Title Given Pt's intake FOTO score of 49/100 , Pt will likely gain 7 points on functional scale by DC.    Baseline dependent    Time 12    Period Weeks    Status Achieved   Pt's intake FOTO score was 49/100.Pt's FOTO score on  visit 10 measures 61/100. Increase . To date Pt reports an increase in percieved function of 12 points. This value meet goal predicting 7 pt increase in functional score.                Plan - 04/26/20 1358    Clinical Impression Statement RLE knee length custom compression stocking issued last visit is working well and is comfortable. Swelling is well contained and Pt denies LE-related pain in the RLE. Commenced CDT to LLE  today. Pt tolerated MLD without increased pain. No compression wraps to LLE after manual therapy as Pt did not bring them with her today. Cont as per POC. Cont as per POC.    OT Occupational Profile and History Comprehensive Assessment- Review of records and extensive additional review of physical, cognitive, psychosocial history related to current functional performance    Occupational performance deficits (Please refer to evaluation for details): ADL's;IADL's;Work;Leisure;Social Participation;Other    Rehab Potential Good    Clinical Decision Making Several treatment options, min-mod task modification necessary    Comorbidities Affecting Occupational Performance: Presence of comorbidities impacting occupational performance    Modification or Assistance to Complete Evaluation  Min-Moderate modification of tasks or assist with assess necessary to complete eval    OT Frequency 2x / week    OT Duration 12 weeks    OT Treatment/Interventions Self-care/ADL training;DME and/or AE instruction;Manual lymph drainage;Compression bandaging;Therapeutic activities;Therapeutic exercise;Other (comment);Manual Therapy;Patient/family education    Plan CDT to BLE: MLD, skin care, compression wraps and/ or garments, ther ex    Recommended Other Services replace existing custom, ccl 2 Elvarex flat knit knee high compression stockings    Consulted and Agree with Plan of Care Patient           Patient will benefit from skilled therapeutic intervention in order to improve the following  deficits and impairments:           Visit Diagnosis: Lymphedema, not elsewhere classified    Problem List Patient Active Problem List   Diagnosis Date Noted  . Positive colorectal cancer screening using Cologuard test   . Cecal polyp   . Polyp of sigmoid colon   . Paroxysmal atrial fibrillation ([place]) 10/02/2019  . Thoracic aortic ectasia ([place]) 08/31/2019  . Monitoring for anticoagulant use 01/06/2019  . Diverticulosis of colon 08/08/2018  . Cholelithiasis 08/08/2018  . Endometrial cancer ([place]) 04/10/2017  . 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 Chronic acquired lymphedema 01/08/2017  . Obesity, morbid, BMI 40.0-49.9 ([place]) 11/13/2016  . FIGO stage II endometrial cancer ([place]) 10/29/2016  . Left ventricular hypertrophy 11/27/2014  . Lymphedema of lower extremity 08/23/2014  . Severe aortic stenosis 08/21/2014  . Benign essential HTN 08/21/2014  . Depression, major, recurrent, mild ([place]) 08/21/2014  . Dyslipidemia 08/21/2014  . Cardiac dysrhythmia 08/21/2014  . Dysmetabolic syndrome 12/82/0813  . Symptomatic menopausal or female climacteric states 08/21/2014  . 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 History of shingles 08/21/2014  . Personal history of skin cancer 11/24/2009    Andrey Spearman, MS, OTR/L, [facility] 04/26/20 2:03 PM   [place] MAIN [facility] SERVICES [address] [place], [place], [address] Phone: [phone]   Fax:  [phone]  Name: Melody Soto MRN: [id number] Date of Birth: 1950/11/07

## 2020-04-27 ENCOUNTER — Ambulatory Visit: Payer: Medicare HMO | Admitting: Radiation Oncology

## 2020-04-28 ENCOUNTER — Ambulatory Visit: Payer: Medicare HMO | Admitting: Occupational Therapy

## 2020-04-28 ENCOUNTER — Other Ambulatory Visit: Payer: Self-pay

## 2020-04-28 ENCOUNTER — Other Ambulatory Visit: Payer: Self-pay | Admitting: Family Medicine

## 2020-04-28 DIAGNOSIS — I890 Lymphedema, not elsewhere classified: Secondary | ICD-10-CM | POA: Diagnosis not present

## 2020-04-28 DIAGNOSIS — I10 Essential (primary) hypertension: Secondary | ICD-10-CM

## 2020-04-28 NOTE — Therapy (Signed)
[place] MAIN [facility] SERVICES [address] [place], [place], [address] Phone: [phone]   Fax:  [phone]  Occupational Therapy Treatment  Patient Details  Name: Melody Soto MRN: [id number] Date of Birth: 1950/09/15 Referring Provider (OT): adolescence by report   Encounter Date: 04/28/2020   OT End of Session - 04/28/20 1013    Visit Number 15    Number of Visits 36    Date for OT Re-Evaluation 07/27/20    OT Start Time 1008    OT Stop Time 1100    OT Time Calculation (min) 52 min    Activity Tolerance Patient tolerated treatment well;No increased pain    Behavior During Therapy WFL for tasks assessed/performed           Past Medical History:  Diagnosis Date  . Aortic stenosis   . Cataract   . Depression   . Dysrhythmia   . Endometrial cancer determined by uterine biopsy ([place]) 10/29/2016   Rad tx's and internal brachytherapy.  Marland Kitchen Heart murmur    HX OF  . Hyperlipidemia   . Hypertension    CONTROLLED ON MEDS  . Lymphedema   . Shortness of breath dyspnea     Past Surgical History:  Procedure Laterality Date  . 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 CARDIAC CATHETERIZATION    . CARDIOVERSION  10/2019  . cataract surgery Bilateral 05/24/2014   second eye 06/07/2014  . COLONOSCOPY WITH PROPOFOL N/A 09/24/2019   Procedure: COLONOSCOPY WITH PROPOFOL;  Surgeon: [name], MD;  Location: [facility] ENDOSCOPY;  Service: Gastroenterology;  Laterality: N/A;  . COLONOSCOPY WITH PROPOFOL N/A 12/10/2019   Procedure: COLONOSCOPY WITH PROPOFOL;  Surgeon: [name], MD;  Location: [facility] ENDOSCOPY;  Service: Endoscopy;  Laterality: N/A;  . CORONARY ARTERY BYPASS GRAFT    . EYE SURGERY    . MELANOMA EXCISION    . OVARIAN CYST REMOVAL     EXPLORATORY LAPAROTOMY  . TONSILLECTOMY      There were no vitals filed for this visit.   Subjective Assessment - 04/28/20 1013    Subjective  Deb McGowanpresents  for OT Rx visit 15/36 to address BLE lipo-lymphedema. Pt denies RLE LE related leg pain. Pt has no new complaints today.    Pertinent History bilateral transcatheter aortic valve replacement 02/27/2017; melanoma excision; ovarian cyst removal; aortic stenosis, endometrial cancer 2018 w XRT and brachytherapy; HTN, SOB, BLE lipo-lymphedema    Limitations leg swelling and associated pain, decreased standing and walking tolerance >15 minutes    Repetition Increases Symptoms    Special Tests + Stemmer bolaterally    Patient Stated Goals reduce RLE ankle and foot swelling and keep it from gettin worse; replace worn outy custom compression garments.    Pain Onset Other (comment)   > 25 years                       OT Treatments/Exercises (OP) - 04/28/20 1233      ADLs   ADL Education Given Yes      Manual Therapy   Manual Therapy Edema management;Compression Bandaging;Manual Lymphatic Drainage (MLD);Other (comment)    Manual therapy comments skin care to LLE throughout MLD with low ph castor oil throughout MLD to improve skin hydration and reduce infection risk    Manual Lymphatic Drainage (MLD) MLD to LLE utilizing short neck sequence, deep abdominal breathing, functional inguinal LN and leg strokes from proximal to distal . Fibrosis techniques  to medial and lateral ankle.    Compression Bandaging LLE gradient compression wraps as established- knee length    Kinesiotex --   achilles tendonitis                 OT Education - 04/28/20 1234    Education Details Continued skilled Pt/caregiver education  And LE ADL training throughout visit for lymphedema self care/ home program, including compression wrapping, compression garment and device wear/care, lymphatic pumping ther ex, simple self-MLD, and skin care. Discussed progress towards goals.    Person(s) Educated Patient    Methods Explanation;Demonstration    Comprehension Verbalized understanding;Returned demonstration                OT Long Term Goals - 03/24/20 1300      OT LONG TERM GOAL #1   Title Pt modified independent w/ lymphedema precautions/prevention principals and using printed reference to limit LE progression and infection risk.    Baseline min A    Time 4    Period Days   4th OT Rx visit   Status Achieved      OT LONG TERM GOAL #2   Title Lymphedema (LE) management/ self-care: Pt able to apply knee -length gradient compression wraps with extra time and printed reference PRN (modified independence) within 10 visits to achieve optimal limb volume reduction during Intensive Phase CDT.    Baseline Max A    Time 4    Period Days    Status Achieved      OT LONG TERM GOAL #3   Title Lymphedema (LE) management/ self-care:  Pt to achieve at least 5% limb volume reductions below R knees during Intensive Phase CDT to limit LE progression, to improve tissue integrity, to decrease infection risk and to improve functional arm and hand use essential for basic and instrumental ADLs performance.    Baseline Mod A    Time 12    Period Weeks    Status On-going   Unfrtunately not met when measured today. Volumes increased for RLE leg, thigh and full limb since commencing Rx on 01/28/20     OT LONG TERM GOAL #4   Title Lymphedema (LE) management/ self-care:  Pt to tolerate daily compression wraps, compression garments and/ or HOS devices in keeping w/ prescribed wear regime within 1 week of issue date of each to progress and retain clinical and functional gains and to limit LE progression.    Baseline Max A    Time 12    Period Weeks    Status Achieved      OT LONG TERM GOAL #5   Title Given Pt's intake FOTO score of 49/100 , Pt will likely gain 7 points on functional scale by DC.    Baseline dependent    Time 12    Period Weeks    Status Achieved   Pt's intake FOTO score was 49/100.Pt's FOTO score on visit 10 measures 61/100. Increase . To date Pt reports an increase in percieved function of 12  points. This value meet goal predicting 7 pt increase in functional score.                Plan - 04/28/20 1235    Clinical Impression Statement Pt arrives without wraps in place on RLE.  RLE swelling  well contained with new compression nee high. LLE swelling and skin condition unchanged since last visit. Provided LLE skin care during MLD and reapplied without increased pain. Pt managing well between sessions.  Cont as per POC.    OT Occupational Profile and History Comprehensive Assessment- Review of records and extensive additional review of physical, cognitive, psychosocial history related to current functional performance    Occupational performance deficits (Please refer to evaluation for details): ADL's;IADL's;Work;Leisure;Social Participation;Other    Rehab Potential Good    Clinical Decision Making Several treatment options, min-mod task modification necessary    Comorbidities Affecting Occupational Performance: Presence of comorbidities impacting occupational performance    Modification or Assistance to Complete Evaluation  Min-Moderate modification of tasks or assist with assess necessary to complete eval    OT Frequency 2x / week    OT Duration 12 weeks    OT Treatment/Interventions Self-care/ADL training;DME and/or AE instruction;Manual lymph drainage;Compression bandaging;Therapeutic activities;Therapeutic exercise;Other (comment);Manual Therapy;Patient/family education    Plan CDT to BLE: MLD, skin care, compression wraps and/ or garments, ther ex    Recommended Other Services replace existing custom, ccl 2 Elvarex flat knit knee high compression stockings    Consulted and Agree with Plan of Care Patient           Patient will benefit from skilled therapeutic intervention in order to improve the following deficits and impairments:           Visit Diagnosis: Lymphedema, not elsewhere classified - Plan: Ot plan of care cert/re-cert    Problem List Patient Active  Problem List   Diagnosis Date Noted  . Positive colorectal cancer screening using Cologuard test   . Cecal polyp   . Polyp of sigmoid colon   . Paroxysmal atrial fibrillation ([place]) 10/02/2019  . Thoracic aortic ectasia ([place]) 08/31/2019  . Monitoring for anticoagulant use 01/06/2019  . Diverticulosis of colon 08/08/2018  . Cholelithiasis 08/08/2018  . Endometrial cancer ([place]) 04/10/2017  . 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 Chronic acquired lymphedema 01/08/2017  . Obesity, morbid, BMI 40.0-49.9 ([place]) 11/13/2016  . FIGO stage II endometrial cancer ([place]) 10/29/2016  . Left ventricular hypertrophy 11/27/2014  . Lymphedema of lower extremity 08/23/2014  . Severe aortic stenosis 08/21/2014  . Benign essential HTN 08/21/2014  . Depression, major, recurrent, mild ([place]) 08/21/2014  . Dyslipidemia 08/21/2014  . Cardiac dysrhythmia 08/21/2014  . Dysmetabolic syndrome 43/60/6770  . Symptomatic menopausal or female climacteric states 08/21/2014  . 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 History of shingles 08/21/2014  . Personal history of skin cancer 11/24/2009    Andrey Spearman, MS, OTR/L, [facility] 04/28/20 12:42 PM   [place] MAIN [facility] SERVICES [address] [place], [place], [address] Phone: [phone]   Fax:  [phone]  Name: AVAIAH STEMPEL MRN: [id number] Date of Birth: December 21, 1950

## 2020-04-28 NOTE — Telephone Encounter (Signed)
Requested Prescriptions  Pending Prescriptions Disp Refills  . telmisartan (MICARDIS) 40 MG tablet [Pharmacy Med Name: TELMISARTAN 40 MG TABLET] 90 tablet 1    Sig: TAKE 1 TABLET BY MOUTH EVERY DAY     Cardiovascular:  Angiotensin Receptor Blockers Failed - 04/28/2020  9:24 AM      Failed - Last BP in normal range    BP Readings from Last 1 Encounters:  12/28/19 (!) 158/87         Passed - Cr in normal range and within 180 days    Creat  Date Value Ref Range Status  12/21/2019 0.68 0.50 - 0.99 mg/dL Final    Comment:    For patients >34 years of age, the reference limit for Creatinine is approximately 13% higher for people identified as African-American. .          Passed - K in normal range and within 180 days    Potassium  Date Value Ref Range Status  12/21/2019 5.0 3.5 - 5.3 mmol/L Final         Passed - Patient is not pregnant      Passed - Valid encounter within last 6 months    Recent Outpatient Visits          4 months ago Paroxysmal atrial fibrillation [facility])   [facility] [place], Drue Stager, MD   11 months ago Obesity, morbid, BMI 40.0-49.9 [facility])   [facility] Steele Sizer, MD   1 year ago Mild major depression [facility])   [facility] Steele Sizer, MD   2 years ago Major depression in remission [facility])   [facility] Steele Sizer, MD   2 years ago [facility] preventive visit   [facility] Steele Sizer, MD      Future Appointments            In 1 month Ancil Boozer, Drue Stager, MD [facility] [place]   In 3 months  [facility], [facility]

## 2020-05-03 ENCOUNTER — Ambulatory Visit: Payer: Medicare HMO | Admitting: Occupational Therapy

## 2020-05-03 ENCOUNTER — Other Ambulatory Visit: Payer: Self-pay

## 2020-05-03 DIAGNOSIS — I890 Lymphedema, not elsewhere classified: Secondary | ICD-10-CM

## 2020-05-04 NOTE — Therapy (Signed)
[place] MAIN [facility] SERVICES [address] [place], [place], [address] Phone: [phone]   Fax:  [phone]  Occupational Therapy Treatment  Patient Details  Name: Melody Soto MRN: [id number] Date of Birth: Jul 25, 1950 Referring Provider (OT): adolescence by report   Encounter Date: 05/03/2020   OT End of Session - 05/03/20 1321    Visit Number 16    Number of Visits 36    Date for OT Re-Evaluation 07/27/20    OT Start Time 1105    OT Stop Time 4742    OT Time Calculation (min) 60 min    Activity Tolerance Patient tolerated treatment well;No increased pain    Behavior During Therapy WFL for tasks assessed/performed           Past Medical History:  Diagnosis Date  . Aortic stenosis   . Cataract   . Depression   . Dysrhythmia   . Endometrial cancer determined by uterine biopsy ([place]) 10/29/2016   Rad tx's and internal brachytherapy.  Marland Kitchen Heart murmur    HX OF  . Hyperlipidemia   . Hypertension    CONTROLLED ON MEDS  . Lymphedema   . Shortness of breath dyspnea     Past Surgical History:  Procedure Laterality Date  . 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 CARDIAC CATHETERIZATION    . CARDIOVERSION  10/2019  . cataract surgery Bilateral 05/24/2014   second eye 06/07/2014  . COLONOSCOPY WITH PROPOFOL N/A 09/24/2019   Procedure: COLONOSCOPY WITH PROPOFOL;  Surgeon: [name], MD;  Location: [facility] ENDOSCOPY;  Service: Gastroenterology;  Laterality: N/A;  . COLONOSCOPY WITH PROPOFOL N/A 12/10/2019   Procedure: COLONOSCOPY WITH PROPOFOL;  Surgeon: [name], MD;  Location: [facility] ENDOSCOPY;  Service: Endoscopy;  Laterality: N/A;  . CORONARY ARTERY BYPASS GRAFT    . EYE SURGERY    . MELANOMA EXCISION    . OVARIAN CYST REMOVAL     EXPLORATORY LAPAROTOMY  . TONSILLECTOMY      There were no vitals filed for this visit.   Subjective Assessment - 05/03/20 1325    Subjective  Melody Soto  for OT Rx visit 16/36 to address BLE lipo-lymphedema. Pt denies RLE LE related leg pain. Pt has no new complaints today.    Pertinent History bilateral transcatheter aortic valve replacement 02/27/2017; melanoma excision; ovarian cyst removal; aortic stenosis, endometrial cancer 2018 w XRT and brachytherapy; HTN, SOB, BLE lipo-lymphedema    Limitations leg swelling and associated pain, decreased standing and walking tolerance >15 minutes    Repetition Increases Symptoms    Special Tests + Stemmer bolaterally    Patient Stated Goals reduce RLE ankle and foot swelling and keep it from gettin worse; replace worn outy custom compression garments.    Pain Onset Other (comment)   > 25 years                       OT Treatments/Exercises (OP) - 05/04/20 0001      ADLs   ADL Education Given Yes      Manual Therapy   Manual Therapy Edema management;Compression Bandaging;Manual Lymphatic Drainage (MLD);Other (comment)    Manual therapy comments skin care to RLE throughout MLD with low ph castor oil throughout MLD to improve skin hydration and reduce infection risk    Manual Lymphatic Drainage (MLD) MLD to RLE utilizing short neck sequence, deep abdominal breathing, functional inguinal LN and leg strokes from proximal to distal . Fibrosis techniques  to medial and lateral ankle.    Compression Bandaging RLE gradient compression wraps as established- knee length    Kinesiotex --   achilles tendonitis                 OT Education - 05/04/20 1326    Education Details Continued skilled Pt/caregiver education  And LE ADL training throughout visit for lymphedema self care/ home program, including compression wrapping, compression garment and device wear/care, lymphatic pumping ther ex, simple self-MLD, and skin care. Discussed progress towards goals.    Person(s) Educated Patient    Methods Explanation;Demonstration    Comprehension Verbalized understanding;Returned demonstration                OT Long Term Goals - 03/24/20 1300      OT LONG TERM GOAL #1   Title Pt modified independent w/ lymphedema precautions/prevention principals and using printed reference to limit LE progression and infection risk.    Baseline min A    Time 4    Period Days   4th OT Rx visit   Status Achieved      OT LONG TERM GOAL #2   Title Lymphedema (LE) management/ self-care: Pt able to apply knee -length gradient compression wraps with extra time and printed reference PRN (modified independence) within 10 visits to achieve optimal limb volume reduction during Intensive Phase CDT.    Baseline Max A    Time 4    Period Days    Status Achieved      OT LONG TERM GOAL #3   Title Lymphedema (LE) management/ self-care:  Pt to achieve at least 5% limb volume reductions below R knees during Intensive Phase CDT to limit LE progression, to improve tissue integrity, to decrease infection risk and to improve functional arm and hand use essential for basic and instrumental ADLs performance.    Baseline Mod A    Time 12    Period Weeks    Status On-going   Unfrtunately not met when measured today. Volumes increased for RLE leg, thigh and full limb since commencing Rx on 01/28/20     OT LONG TERM GOAL #4   Title Lymphedema (LE) management/ self-care:  Pt to tolerate daily compression wraps, compression garments and/ or HOS devices in keeping w/ prescribed wear regime within 1 week of issue date of each to progress and retain clinical and functional gains and to limit LE progression.    Baseline Max A    Time 12    Period Weeks    Status Achieved      OT LONG TERM GOAL #5   Title Given Pt's intake FOTO score of 49/100 , Pt will likely gain 7 points on functional scale by DC.    Baseline dependent    Time 12    Period Weeks    Status Achieved   Pt's intake FOTO score was 49/100.Pt's FOTO score on visit 10 measures 61/100. Increase . To date Pt reports an increase in percieved function of 12  points. This value meet goal predicting 7 pt increase in functional score.                Plan - 05/04/20 1323    Clinical Impression Statement Pt arrives with wraps in place on LLE. Pt diligently wearing LLE stocking daily as directed. Provided LLE skin care during MLD and reapplied gradient compression wrap without increased pain. Pt managing well between sessions.  Cont as per POC.    OT  Occupational Profile and History Comprehensive Assessment- Review of records and extensive additional review of physical, cognitive, psychosocial history related to current functional performance    Occupational performance deficits (Please refer to evaluation for details): ADL's;IADL's;Work;Leisure;Social Participation;Other    Rehab Potential Good    Clinical Decision Making Several treatment options, min-mod task modification necessary    Comorbidities Affecting Occupational Performance: Presence of comorbidities impacting occupational performance    Modification or Assistance to Complete Evaluation  Min-Moderate modification of tasks or assist with assess necessary to complete eval    OT Frequency 2x / week    OT Duration 12 weeks    OT Treatment/Interventions Self-care/ADL training;DME and/or AE instruction;Manual lymph drainage;Compression bandaging;Therapeutic activities;Therapeutic exercise;Other (comment);Manual Therapy;Patient/family education    Plan CDT to BLE: MLD, skin care, compression wraps and/ or garments, ther ex    Recommended Other Services replace existing custom, ccl 2 Elvarex flat knit knee high compression stockings    Consulted and Agree with Plan of Care Patient           Patient will benefit from skilled therapeutic intervention in order to improve the following deficits and impairments:           Visit Diagnosis: Lymphedema, not elsewhere classified    Problem List Patient Active Problem List   Diagnosis Date Noted  . Positive colorectal cancer screening  using Cologuard test   . Cecal polyp   . Polyp of sigmoid colon   . Paroxysmal atrial fibrillation ([place]) 10/02/2019  . Thoracic aortic ectasia ([place]) 08/31/2019  . Monitoring for anticoagulant use 01/06/2019  . Diverticulosis of colon 08/08/2018  . Cholelithiasis 08/08/2018  . Endometrial cancer ([place]) 04/10/2017  . 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 Chronic acquired lymphedema 01/08/2017  . Obesity, morbid, BMI 40.0-49.9 ([place]) 11/13/2016  . FIGO stage II endometrial cancer ([place]) 10/29/2016  . Left ventricular hypertrophy 11/27/2014  . Lymphedema of lower extremity 08/23/2014  . Severe aortic stenosis 08/21/2014  . Benign essential HTN 08/21/2014  . Depression, major, recurrent, mild ([place]) 08/21/2014  . Dyslipidemia 08/21/2014  . Cardiac dysrhythmia 08/21/2014  . Dysmetabolic syndrome 75/17/0017  . Symptomatic menopausal or female climacteric states 08/21/2014  . 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 History of shingles 08/21/2014  . Personal history of skin cancer 11/24/2009    Andrey Spearman, MS, OTR/L, [facility] 05/04/20 1:27 PM  [place] MAIN [facility] SERVICES [address] [place], [place], [address] Phone: [phone]   Fax:  [phone]  Name: Melody Soto MRN: [id number] Date of Birth: May 25, 1950

## 2020-05-05 ENCOUNTER — Ambulatory Visit: Payer: Medicare HMO | Admitting: Occupational Therapy

## 2020-05-05 ENCOUNTER — Other Ambulatory Visit: Payer: Self-pay

## 2020-05-05 DIAGNOSIS — I890 Lymphedema, not elsewhere classified: Secondary | ICD-10-CM | POA: Diagnosis not present

## 2020-05-05 NOTE — Therapy (Signed)
[place] MAIN [facility] SERVICES [address] [place], [place], [address] Phone: [phone]   Fax:  [phone]  Occupational Therapy Treatment  Patient Details  Name: Melody Soto MRN: [id number] Date of Birth: May 12, 1950 Referring Provider (OT): adolescence by report   Encounter Date: 05/05/2020   OT End of Session - 05/05/20 1012    Visit Number 17    Number of Visits 36    Date for OT Re-Evaluation 07/27/20    OT Start Time 1007    OT Stop Time 1115    OT Time Calculation (min) 68 min    Activity Tolerance Patient tolerated treatment well;No increased pain    Behavior During Therapy WFL for tasks assessed/performed           Past Medical History:  Diagnosis Date  . Aortic stenosis   . Cataract   . Depression   . Dysrhythmia   . Endometrial cancer determined by uterine biopsy ([place]) 10/29/2016   Rad tx's and internal brachytherapy.  Marland Kitchen Heart murmur    HX OF  . Hyperlipidemia   . Hypertension    CONTROLLED ON MEDS  . Lymphedema   . Shortness of breath dyspnea     Past Surgical History:  Procedure Laterality Date  . 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 CARDIAC CATHETERIZATION    . CARDIOVERSION  10/2019  . cataract surgery Bilateral 05/24/2014   second eye 06/07/2014  . COLONOSCOPY WITH PROPOFOL N/A 09/24/2019   Procedure: COLONOSCOPY WITH PROPOFOL;  Surgeon: [name], MD;  Location: [facility] ENDOSCOPY;  Service: Gastroenterology;  Laterality: N/A;  . COLONOSCOPY WITH PROPOFOL N/A 12/10/2019   Procedure: COLONOSCOPY WITH PROPOFOL;  Surgeon: [name], MD;  Location: [facility] ENDOSCOPY;  Service: Endoscopy;  Laterality: N/A;  . CORONARY ARTERY BYPASS GRAFT    . EYE SURGERY    . MELANOMA EXCISION    . OVARIAN CYST REMOVAL     EXPLORATORY LAPAROTOMY  . TONSILLECTOMY      There were no vitals filed for this visit.   Subjective Assessment - 05/05/20 1019    Subjective  Melody McGowanpresents  for OT Rx visit 17/36 to address BLE lipo-lymphedema. Pt denies RLE LE related leg pain. Pt has no new complaints today. Pt in agreement plan to complete LLE garment measurements.    Pertinent History bilateral transcatheter aortic valve replacement 02/27/2017; melanoma excision; ovarian cyst removal; aortic stenosis, endometrial cancer 2018 w XRT and brachytherapy; HTN, SOB, BLE lipo-lymphedema    Limitations leg swelling and associated pain, decreased standing and walking tolerance >15 minutes    Repetition Increases Symptoms    Special Tests + Stemmer bolaterally    Patient Stated Goals reduce RLE ankle and foot swelling and keep it from gettin worse; replace worn outy custom compression garments.    Pain Onset Other (comment)   > 25 years              LYMPHEDEMA/ONCOLOGY QUESTIONNAIRE - 05/05/20 1132      Left Lower Extremity Lymphedema   Other LLE limb volume  ankle to tibial tuberosity (A-D) measures 5280.2 ml.    Other LLE limb volume from ankle to tibial tuberosity (A-D) is decreased by 11.1% since commencing CDT on 01/28/20.                   OT Treatments/Exercises (OP) - 05/05/20 1021      ADLs   ADL Education Given Yes      Manual  Therapy   Manual Therapy Edema management;Compression Bandaging;Manual Lymphatic Drainage (MLD)    Manual therapy comments skin care to RLE throughout MLD with low ph castor oil throughout MLD to improve skin hydration and reduce infection risk    Edema Management LLE comparative limb volumetrics    Manual Lymphatic Drainage (MLD) MLD to LLE utilizing short neck sequence, deep abdominal breathing, functional inguinal LN and leg strokes from proximal to distal . Fibrosis techniques to medial and lateral ankle.    Compression Bandaging LLE gradient compression wraps as established- knee length                  OT Education - 05/05/20 1135    Education Details Continued skilled Pt/caregiver education  And LE ADL training  throughout visit for lymphedema self care/ home program, including compression wrapping, compression garment and device wear/care, lymphatic pumping ther ex, simple self-MLD, and skin care. Discussed progress towards goals.    Person(s) Educated Patient    Methods Explanation;Demonstration    Comprehension Verbalized understanding;Returned demonstration               OT Long Term Goals - 03/24/20 1300      OT LONG TERM GOAL #1   Title Pt modified independent w/ lymphedema precautions/prevention principals and using printed reference to limit LE progression and infection risk.    Baseline min A    Time 4    Period Days   4th OT Rx visit   Status Achieved      OT LONG TERM GOAL #2   Title Lymphedema (LE) management/ self-care: Pt able to apply knee -length gradient compression wraps with extra time and printed reference PRN (modified independence) within 10 visits to achieve optimal limb volume reduction during Intensive Phase CDT.    Baseline Max A    Time 4    Period Days    Status Achieved      OT LONG TERM GOAL #3   Title Lymphedema (LE) management/ self-care:  Pt to achieve at least 5% limb volume reductions below R knees during Intensive Phase CDT to limit LE progression, to improve tissue integrity, to decrease infection risk and to improve functional arm and hand use essential for basic and instrumental ADLs performance.    Baseline Mod A    Time 12    Period Weeks    Status On-going   Unfrtunately not met when measured today. Volumes increased for RLE leg, thigh and full limb since commencing Rx on 01/28/20     OT LONG TERM GOAL #4   Title Lymphedema (LE) management/ self-care:  Pt to tolerate daily compression wraps, compression garments and/ or HOS devices in keeping w/ prescribed wear regime within 1 week of issue date of each to progress and retain clinical and functional gains and to limit LE progression.    Baseline Max A    Time 12    Period Weeks    Status  Achieved      OT LONG TERM GOAL #5   Title Given Pt's intake FOTO score of 49/100 , Pt will likely gain 7 points on functional scale by DC.    Baseline dependent    Time 12    Period Weeks    Status Achieved   Pt's intake FOTO score was 49/100.Pt's FOTO score on visit 10 measures 61/100. Increase . To date Pt reports an increase in percieved function of 12 points. This value meet goal predicting 7 pt increase in functional score.  Plan - 05/05/20 1152    Clinical Impression Statement RLE comparative limb volumetrics reveal LLE is decreased below the knee by 11.1%, which meets volume reduction goal. Pt agrees with plan to complete anatomical measurements for RLE custom compression garment and HOS device next week. Remainder of session devoted to manual therapy and compression wrapping with excellent tolerance. Cont as per POC.    OT Occupational Profile and History Comprehensive Assessment- Review of records and extensive additional review of physical, cognitive, psychosocial history related to current functional performance    Occupational performance deficits (Please refer to evaluation for details): ADL's;IADL's;Work;Leisure;Social Participation;Other    Rehab Potential Good    Clinical Decision Making Several treatment options, min-mod task modification necessary    Comorbidities Affecting Occupational Performance: Presence of comorbidities impacting occupational performance    Modification or Assistance to Complete Evaluation  Min-Moderate modification of tasks or assist with assess necessary to complete eval    OT Frequency 2x / week    OT Duration 12 weeks    OT Treatment/Interventions Self-care/ADL training;DME and/or AE instruction;Manual lymph drainage;Compression bandaging;Therapeutic activities;Therapeutic exercise;Other (comment);Manual Therapy;Patient/family education    Plan CDT to BLE: MLD, skin care, compression wraps and/ or garments, ther ex     Recommended Other Services replace existing custom, ccl 2 Elvarex flat knit knee high compression stockings    Consulted and Agree with Plan of Care Patient           Patient will benefit from skilled therapeutic intervention in order to improve the following deficits and impairments:           Visit Diagnosis: Lymphedema, not elsewhere classified    Problem List Patient Active Problem List   Diagnosis Date Noted  . Positive colorectal cancer screening using Cologuard test   . Cecal polyp   . Polyp of sigmoid colon   . Paroxysmal atrial fibrillation ([place]) 10/02/2019  . Thoracic aortic ectasia ([place]) 08/31/2019  . Monitoring for anticoagulant use 01/06/2019  . Diverticulosis of colon 08/08/2018  . Cholelithiasis 08/08/2018  . Endometrial cancer ([place]) 04/10/2017  . 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 Chronic acquired lymphedema 01/08/2017  . Obesity, morbid, BMI 40.0-49.9 ([place]) 11/13/2016  . FIGO stage II endometrial cancer ([place]) 10/29/2016  . Left ventricular hypertrophy 11/27/2014  . Lymphedema of lower extremity 08/23/2014  . Severe aortic stenosis 08/21/2014  . Benign essential HTN 08/21/2014  . Depression, major, recurrent, mild ([place]) 08/21/2014  . Dyslipidemia 08/21/2014  . Cardiac dysrhythmia 08/21/2014  . Dysmetabolic syndrome 89/21/1941  . Symptomatic menopausal or female climacteric states 08/21/2014  . 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 History of shingles 08/21/2014  . Personal history of skin cancer 11/24/2009    Andrey Spearman, MS, OTR/L, [facility] 05/05/20 11:55 AM  [place] MAIN [facility] SERVICES [address] [place], [place], [address] Phone: [phone]   Fax:  [phone]  Name: Melody Soto MRN: [id number] Date of Birth: 08/07/1950

## 2020-05-09 ENCOUNTER — Other Ambulatory Visit: Payer: Self-pay

## 2020-05-09 ENCOUNTER — Ambulatory Visit: Payer: Medicare HMO | Admitting: Occupational Therapy

## 2020-05-09 DIAGNOSIS — I890 Lymphedema, not elsewhere classified: Secondary | ICD-10-CM | POA: Diagnosis not present

## 2020-05-09 NOTE — Therapy (Signed)
[place] MAIN [facility] SERVICES [address] [place], [place], [address] Phone: [phone]   Fax:  [phone]  Occupational Therapy Treatment  Patient Details  Name: Melody Soto MRN: [id number] Date of Birth: Jul 05, 1950 Referring Provider (OT): adolescence by report   Encounter Date: 05/09/2020   OT End of Session - 05/09/20 1024    Visit Number 18    Number of Visits 36    Date for OT Re-Evaluation 07/27/20    OT Start Time 1015    OT Stop Time 1105    OT Time Calculation (min) 50 min    Activity Tolerance Patient tolerated treatment well;No increased pain    Behavior During Therapy WFL for tasks assessed/performed           Past Medical History:  Diagnosis Date  . Aortic stenosis   . Cataract   . Depression   . Dysrhythmia   . Endometrial cancer determined by uterine biopsy ([place]) 10/29/2016   Rad tx's and internal brachytherapy.  Marland Kitchen Heart murmur    HX OF  . Hyperlipidemia   . Hypertension    CONTROLLED ON MEDS  . Lymphedema   . Shortness of breath dyspnea     Past Surgical History:  Procedure Laterality Date  . 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 CARDIAC CATHETERIZATION    . CARDIOVERSION  10/2019  . cataract surgery Bilateral 05/24/2014   second eye 06/07/2014  . COLONOSCOPY WITH PROPOFOL N/A 09/24/2019   Procedure: COLONOSCOPY WITH PROPOFOL;  Surgeon: [name], MD;  Location: [facility] ENDOSCOPY;  Service: Gastroenterology;  Laterality: N/A;  . COLONOSCOPY WITH PROPOFOL N/A 12/10/2019   Procedure: COLONOSCOPY WITH PROPOFOL;  Surgeon: [name], MD;  Location: [facility] ENDOSCOPY;  Service: Endoscopy;  Laterality: N/A;  . CORONARY ARTERY BYPASS GRAFT    . EYE SURGERY    . MELANOMA EXCISION    . OVARIAN CYST REMOVAL     EXPLORATORY LAPAROTOMY  . TONSILLECTOMY      There were no vitals filed for this visit.   Subjective Assessment - 05/09/20 1025    Subjective  Melody McGowanpresents  for OT Rx visit 18/6 to address BLE lipo-lymphedema. Pt denies RLE LE related leg pain. Pt has no new complaints today. Pt in agreement plan to complete LLE garment measurements.    Pertinent History bilateral transcatheter aortic valve replacement 02/27/2017; melanoma excision; ovarian cyst removal; aortic stenosis, endometrial cancer 2018 w XRT and brachytherapy; HTN, SOB, BLE lipo-lymphedema    Limitations leg swelling and associated pain, decreased standing and walking tolerance >15 minutes    Repetition Increases Symptoms    Special Tests + Stemmer bolaterally    Patient Stated Goals reduce RLE ankle and foot swelling and keep it from gettin worse; replace worn outy custom compression garments.    Pain Onset Other (comment)   > 25 years                       OT Treatments/Exercises (OP) - 05/09/20 1254      ADLs   ADL Education Given Yes      Manual Therapy   Manual Therapy Edema management;Compression Bandaging;Manual Lymphatic Drainage (MLD)    Edema Management anatomical measurements for custom day and nighttime compression garments and devices.    Compression Bandaging LLE gradient compression wraps as established- knee length                  OT Education -  05/09/20 1255    Education Details Continued skilled Pt/caregiver education  And LE ADL training throughout visit for lymphedema self care/ home program, including compression wrapping, compression garment and device wear/care, lymphatic pumping ther ex, simple self-MLD, and skin care. Discussed progress towards goals.    Person(s) Educated Patient    Methods Explanation;Demonstration    Comprehension Verbalized understanding;Returned demonstration;Need further instruction               OT Long Term Goals - 03/24/20 1300      OT LONG TERM GOAL #1   Title Pt modified independent w/ lymphedema precautions/prevention principals and using printed reference to limit LE progression and infection risk.     Baseline min A    Time 4    Period Days   4th OT Rx visit   Status Achieved      OT LONG TERM GOAL #2   Title Lymphedema (LE) management/ self-care: Pt able to apply knee -length gradient compression wraps with extra time and printed reference PRN (modified independence) within 10 visits to achieve optimal limb volume reduction during Intensive Phase CDT.    Baseline Max A    Time 4    Period Days    Status Achieved      OT LONG TERM GOAL #3   Title Lymphedema (LE) management/ self-care:  Pt to achieve at least 5% limb volume reductions below R knees during Intensive Phase CDT to limit LE progression, to improve tissue integrity, to decrease infection risk and to improve functional arm and hand use essential for basic and instrumental ADLs performance.    Baseline Mod A    Time 12    Period Weeks    Status On-going   Unfrtunately not met when measured today. Volumes increased for RLE leg, thigh and full limb since commencing Rx on 01/28/20     OT LONG TERM GOAL #4   Title Lymphedema (LE) management/ self-care:  Pt to tolerate daily compression wraps, compression garments and/ or HOS devices in keeping w/ prescribed wear regime within 1 week of issue date of each to progress and retain clinical and functional gains and to limit LE progression.    Baseline Max A    Time 12    Period Weeks    Status Achieved      OT LONG TERM GOAL #5   Title Given Pt's intake FOTO score of 49/100 , Pt will likely gain 7 points on functional scale by DC.    Baseline dependent    Time 12    Period Weeks    Status Achieved   Pt's intake FOTO score was 49/100.Pt's FOTO score on visit 10 measures 61/100. Increase . To date Pt reports an increase in percieved function of 12 points. This value meet goal predicting 7 pt increase in functional score.                Plan - 05/09/20 1249    Clinical Impression Statement Completed anatomical measurements fr LLE custom knee length compression stockings  and custom HOS device, needed to limit fibrosis formation and LE progression during HOS. Applied LLE gradient compression wraps. No MLD today 2/2 time constraints. Cont as per OC.    OT Occupational Profile and History Comprehensive Assessment- Review of records and extensive additional review of physical, cognitive, psychosocial history related to current functional performance    Occupational performance deficits (Please refer to evaluation for details): ADL's;IADL's;Work;Leisure;Social Participation;Other    Rehab Potential Good  Clinical Decision Making Several treatment options, min-mod task modification necessary    Comorbidities Affecting Occupational Performance: Presence of comorbidities impacting occupational performance    Modification or Assistance to Complete Evaluation  Min-Moderate modification of tasks or assist with assess necessary to complete eval    OT Frequency 2x / week    OT Duration 12 weeks    OT Treatment/Interventions Self-care/ADL training;DME and/or AE instruction;Manual lymph drainage;Compression bandaging;Therapeutic activities;Therapeutic exercise;Other (comment);Manual Therapy;Patient/family education    Plan CDT to BLE: MLD, skin care, compression wraps and/ or garments, ther ex    Recommended Other Services replace existing custom, ccl 2 Elvarex flat knit knee high compression stockings    Consulted and Agree with Plan of Care Patient           Patient will benefit from skilled therapeutic intervention in order to improve the following deficits and impairments:           Visit Diagnosis: Lymphedema, not elsewhere classified    Problem List Patient Active Problem List   Diagnosis Date Noted  . Positive colorectal cancer screening using Cologuard test   . Cecal polyp   . Polyp of sigmoid colon   . Paroxysmal atrial fibrillation ([place]) 10/02/2019  . Thoracic aortic ectasia ([place]) 08/31/2019  . Monitoring for anticoagulant use 01/06/2019  .  Diverticulosis of colon 08/08/2018  . Cholelithiasis 08/08/2018  . Endometrial cancer ([place]) 04/10/2017  . 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 Chronic acquired lymphedema 01/08/2017  . Obesity, morbid, BMI 40.0-49.9 ([place]) 11/13/2016  . FIGO stage II endometrial cancer ([place]) 10/29/2016  . Left ventricular hypertrophy 11/27/2014  . Lymphedema of lower extremity 08/23/2014  . Severe aortic stenosis 08/21/2014  . Benign essential HTN 08/21/2014  . Depression, major, recurrent, mild ([place]) 08/21/2014  . Dyslipidemia 08/21/2014  . Cardiac dysrhythmia 08/21/2014  . Dysmetabolic syndrome 49/67/5916  . Symptomatic menopausal or female climacteric states 08/21/2014  . 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 History of shingles 08/21/2014  . Personal history of skin cancer 11/24/2009    Andrey Spearman, MS, OTR/L, [facility] 05/09/20 12:56 PM   [place] MAIN [facility] SERVICES [address] [place], [place], [address] Phone: [phone]   Fax:  [phone]  Name: Melody Soto MRN: [id number] Date of Birth: 1950-10-30

## 2020-05-10 ENCOUNTER — Encounter: Payer: Medicare HMO | Admitting: Occupational Therapy

## 2020-05-10 ENCOUNTER — Ambulatory Visit: Payer: Medicare HMO | Admitting: Occupational Therapy

## 2020-05-12 ENCOUNTER — Other Ambulatory Visit: Payer: Self-pay

## 2020-05-12 ENCOUNTER — Ambulatory Visit: Payer: Medicare HMO | Admitting: Occupational Therapy

## 2020-05-12 ENCOUNTER — Encounter: Payer: Medicare HMO | Admitting: Occupational Therapy

## 2020-05-12 DIAGNOSIS — I890 Lymphedema, not elsewhere classified: Secondary | ICD-10-CM

## 2020-05-12 NOTE — Therapy (Signed)
[place] MAIN [facility] SERVICES [address] [place], [place], [address] Phone: [phone]   Fax:  [phone]  Occupational Therapy Treatment  Patient Details  Name: Melody Soto MRN: [id number] Date of Birth: 1950/04/30 Referring Provider (OT): adolescence by report   Encounter Date: 05/12/2020   OT End of Session - 05/12/20 1023    Visit Number 19    Number of Visits 36    Date for OT Re-Evaluation 07/27/20    OT Start Time 1010    OT Stop Time 1105    OT Time Calculation (min) 55 min    Activity Tolerance Patient tolerated treatment well;No increased pain    Behavior During Therapy WFL for tasks assessed/performed           Past Medical History:  Diagnosis Date  . Aortic stenosis   . Cataract   . Depression   . Dysrhythmia   . Endometrial cancer determined by uterine biopsy ([place]) 10/29/2016   Rad tx's and internal brachytherapy.  Marland Kitchen Heart murmur    HX OF  . Hyperlipidemia   . Hypertension    CONTROLLED ON MEDS  . Lymphedema   . Shortness of breath dyspnea     Past Surgical History:  Procedure Laterality Date  . 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 CARDIAC CATHETERIZATION    . CARDIOVERSION  10/2019  . cataract surgery Bilateral 05/24/2014   second eye 06/07/2014  . COLONOSCOPY WITH PROPOFOL N/A 09/24/2019   Procedure: COLONOSCOPY WITH PROPOFOL;  Surgeon: [name], MD;  Location: [facility] ENDOSCOPY;  Service: Gastroenterology;  Laterality: N/A;  . COLONOSCOPY WITH PROPOFOL N/A 12/10/2019   Procedure: COLONOSCOPY WITH PROPOFOL;  Surgeon: [name], MD;  Location: [facility] ENDOSCOPY;  Service: Endoscopy;  Laterality: N/A;  . CORONARY ARTERY BYPASS GRAFT    . EYE SURGERY    . MELANOMA EXCISION    . OVARIAN CYST REMOVAL     EXPLORATORY LAPAROTOMY  . TONSILLECTOMY      There were no vitals filed for this visit.   Subjective Assessment - 05/12/20 1026    Subjective  Melody Soto  for OT Rx visit 18/6 to address BLE lipo-lymphedema. Pt denies RLE LE related leg pain. Pt has no new complaints today. Pt in agreement plan to complete LLE garment measurements.    Pertinent History bilateral transcatheter aortic valve replacement 02/27/2017; melanoma excision; ovarian cyst removal; aortic stenosis, endometrial cancer 2018 w XRT and brachytherapy; HTN, SOB, BLE lipo-lymphedema    Limitations leg swelling and associated pain, decreased standing and walking tolerance >15 minutes    Repetition Increases Symptoms    Special Tests + Stemmer bolaterally    Patient Stated Goals reduce RLE ankle and foot swelling and keep it from gettin worse; replace worn outy custom compression garments.    Pain Onset Other (comment)   > 25 years                                    OT Long Term Goals - 03/24/20 1300      OT LONG TERM GOAL #1   Title Pt modified independent w/ lymphedema precautions/prevention principals and using printed reference to limit LE progression and infection risk.    Baseline min A    Time 4    Period Days   4th OT Rx visit   Status Achieved      OT LONG  TERM GOAL #2   Title Lymphedema (LE) management/ self-care: Pt able to apply knee -length gradient compression wraps with extra time and printed reference PRN (modified independence) within 10 visits to achieve optimal limb volume reduction during Intensive Phase CDT.    Baseline Max A    Time 4    Period Days    Status Achieved      OT LONG TERM GOAL #3   Title Lymphedema (LE) management/ self-care:  Pt to achieve at least 5% limb volume reductions below R knees during Intensive Phase CDT to limit LE progression, to improve tissue integrity, to decrease infection risk and to improve functional arm and hand use essential for basic and instrumental ADLs performance.    Baseline Mod A    Time 12    Period Weeks    Status On-going   Unfrtunately not met when measured today. Volumes increased  for RLE leg, thigh and full limb since commencing Rx on 01/28/20     OT LONG TERM GOAL #4   Title Lymphedema (LE) management/ self-care:  Pt to tolerate daily compression wraps, compression garments and/ or HOS devices in keeping w/ prescribed wear regime within 1 week of issue date of each to progress and retain clinical and functional gains and to limit LE progression.    Baseline Max A    Time 12    Period Weeks    Status Achieved      OT LONG TERM GOAL #5   Title Given Pt's intake FOTO score of 49/100 , Pt will likely gain 7 points on functional scale by DC.    Baseline dependent    Time 12    Period Weeks    Status Achieved   Pt's intake FOTO score was 49/100.Pt's FOTO score on visit 10 measures 61/100. Increase . To date Pt reports an increase in percieved function of 12 points. This value meet goal predicting 7 pt increase in functional score.                Plan - 05/12/20 1025    Clinical Impression Statement Pt tolerated lle mld, SKIN CARE CONCURRENT W/ mld AND GRADIENT COMPRESSION WRAPS FROM TOES TO BELOW THE KNEE WITHOUT INCREASED PAIN. pT continues to demonstrate good compliance with wraps between sessions. Skin condition is excellent. LLE custom stocking is ordered ad will be fitted as soon as delivered.  Cont as per POC.    OT Occupational Profile and History Comprehensive Assessment- Review of records and extensive additional review of physical, cognitive, psychosocial history related to current functional performance    Occupational performance deficits (Please refer to evaluation for details): ADL's;IADL's;Work;Leisure;Social Participation;Other    Rehab Potential Good    Clinical Decision Making Several treatment options, min-mod task modification necessary    Comorbidities Affecting Occupational Performance: Presence of comorbidities impacting occupational performance    Modification or Assistance to Complete Evaluation  Min-Moderate modification of tasks or assist  with assess necessary to complete eval    OT Frequency 2x / week    OT Duration 12 weeks    OT Treatment/Interventions Self-care/ADL training;DME and/or AE instruction;Manual lymph drainage;Compression bandaging;Therapeutic activities;Therapeutic exercise;Other (comment);Manual Therapy;Patient/family education    Plan CDT to BLE: MLD, skin care, compression wraps and/ or garments, ther ex    Recommended Other Services replace existing custom, ccl 2 Elvarex flat knit knee high compression stockings    Consulted and Agree with Plan of Care Patient  Patient will benefit from skilled therapeutic intervention in order to improve the following deficits and impairments:           Visit Diagnosis: Lymphedema, not elsewhere classified    Problem List Patient Active Problem List   Diagnosis Date Noted  . Positive colorectal cancer screening using Cologuard test   . Cecal polyp   . Polyp of sigmoid colon   . Paroxysmal atrial fibrillation ([place]) 10/02/2019  . Thoracic aortic ectasia ([place]) 08/31/2019  . Monitoring for anticoagulant use 01/06/2019  . Diverticulosis of colon 08/08/2018  . Cholelithiasis 08/08/2018  . Endometrial cancer ([place]) 04/10/2017  . 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 Chronic acquired lymphedema 01/08/2017  . Obesity, morbid, BMI 40.0-49.9 ([place]) 11/13/2016  . FIGO stage II endometrial cancer ([place]) 10/29/2016  . Left ventricular hypertrophy 11/27/2014  . Lymphedema of lower extremity 08/23/2014  . Severe aortic stenosis 08/21/2014  . Benign essential HTN 08/21/2014  . Depression, major, recurrent, mild ([place]) 08/21/2014  . Dyslipidemia 08/21/2014  . Cardiac dysrhythmia 08/21/2014  . Dysmetabolic syndrome 41/63/8453  . Symptomatic menopausal or female climacteric states 08/21/2014  . 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 History of shingles 08/21/2014  . Personal history of skin cancer 11/24/2009    Andrey Spearman, MS, OTR/L, [facility] 05/12/20 12:41 PM   [place] MAIN [facility] SERVICES [address] [place], [place], [address] Phone: [phone]   Fax:  [phone]  Name: Melody Soto MRN: [id number] Date of Birth: Mar 19, 1950

## 2020-05-13 ENCOUNTER — Encounter: Payer: Self-pay | Admitting: Radiation Oncology

## 2020-05-13 ENCOUNTER — Ambulatory Visit
Admission: RE | Admit: 2020-05-13 | Discharge: 2020-05-13 | Disposition: A | Discharge status: Home / Self Care | Payer: Medicare HMO | Source: Ambulatory Visit | Attending: Radiation Oncology | Admitting: Radiation Oncology

## 2020-05-13 VITALS — BP 133/52 | HR 78 | Temp 97.1°F | Wt 242.0 lb

## 2020-05-13 DIAGNOSIS — Z08 Encounter for follow-up examination after completed treatment for malignant neoplasm: Secondary | ICD-10-CM | POA: Diagnosis not present

## 2020-05-13 DIAGNOSIS — Z8542 Personal history of malignant neoplasm of other parts of uterus: Secondary | ICD-10-CM | POA: Insufficient documentation

## 2020-05-13 DIAGNOSIS — C541 Malignant neoplasm of endometrium: Secondary | ICD-10-CM

## 2020-05-13 DIAGNOSIS — I890 Lymphedema, not elsewhere classified: Secondary | ICD-10-CM | POA: Diagnosis not present

## 2020-05-13 DIAGNOSIS — Z923 Personal history of irradiation: Secondary | ICD-10-CM | POA: Insufficient documentation

## 2020-05-13 NOTE — Progress Notes (Signed)
Radiation Oncology Follow up Note  Name: Melody Soto   Date:   05/13/2020 MRN:  [id number] DOB: [birth year]    This 70 y.o. female presents to the clinic today for 3-year follow-up status post both external beam radiation therapy as well as vaginal brachytherapy for stage IIa well differentiated endometrial carcinoma.  REFERRING PROVIDER: Steele Sizer, MD  HPI: Patient is a 70 year old female now at 3 years having completed both external beam radiation as well as vaginal brachytherapy for stage IIa well-differentiated endometrial adenocarcinoma.  She is seen today in routine follow-up is doing well specifically denies any increased lower urinary tract symptoms diarrhea or fatigue..  She has had repeat CT scans of the chest which I reviewed she has multiple bilateral pulmonary nodules which are stable over time.  Patient is receiving physical therapy for bilateral lower extremity lymphedema.  Mammograms from September I have reviewed were BI-RADS 1 benign.  COMPLICATIONS OF TREATMENT: none  FOLLOW UP COMPLIANCE: keeps appointments   PHYSICAL EXAM:  BP (!) 133/52   Pulse 78   Temp (!) 97.1 F (36.2 C) (Tympanic)   Wt 242 lb (109.8 kg)   BMI 36.26 kg/m  Well-developed well-nourished patient in NAD. HEENT reveals PERLA, EOMI, discs not visualized.  Oral cavity is clear. No oral mucosal lesions are identified. Neck is clear without evidence of cervical or supraclavicular adenopathy. Lungs are clear to A&P. Cardiac examination is essentially unremarkable with regular rate and rhythm without murmur rub or thrill. Abdomen is benign with no organomegaly or masses noted. Motor sensory and DTR levels are equal and symmetric in the upper and lower extremities. Cranial nerves II through XII are grossly intact. Proprioception is intact. No peripheral adenopathy or edema is identified. No motor or sensory levels are noted. Crude visual fields are within normal range.  RADIOLOGY RESULTS: CT scan of  chest and mammograms reviewed  PLAN: Present time patient is doing well.  She will see Dr. Theora Gianotti in June for pelvic examination.  I am otherwise pleased with her overall progress have asked to see her back in 1 year and then will turn follow-up care over to GYN oncology or her regular gynecologist.  Patient knows to call with any concerns.  I would like to take this opportunity to thank you for allowing me to participate in the care of your patient.Noreene Filbert, MD

## 2020-05-16 ENCOUNTER — Ambulatory Visit: Payer: Medicare HMO | Attending: Obstetrics and Gynecology | Admitting: Occupational Therapy

## 2020-05-16 DIAGNOSIS — I890 Lymphedema, not elsewhere classified: Secondary | ICD-10-CM | POA: Diagnosis not present

## 2020-05-16 NOTE — Therapy (Signed)
[place] MAIN [facility] SERVICES [address] [place], [place], [address] Phone: [phone]   Fax:  [phone]  Occupational Therapy Treatment Note and Progress Report: Lymphedema Care  Patient Details  Name: Melody Soto MRN: [id number] Date of Birth: [birth year] Referring Provider (OT): adolescence by report   Encounter Date: 05/16/2020   OT End of Session - 05/16/20 0914    Visit Number 20    Number of Visits 36    Date for OT Re-Evaluation 07/27/20    OT Start Time 0909    OT Stop Time 1010    OT Time Calculation (min) 61 min    Activity Tolerance No increased pain;Patient tolerated treatment well    Behavior During Therapy [facility] for tasks assessed/performed           Past Medical History:  Diagnosis Date  . Aortic stenosis   . Cataract   . Depression   . Dysrhythmia   . Endometrial cancer determined by uterine biopsy ([place]) 10/29/2016   Rad tx's and internal brachytherapy.  Marland Kitchen Heart murmur    HX OF  . Hyperlipidemia   . Hypertension    CONTROLLED ON MEDS  . Lymphedema   . Shortness of breath dyspnea     Past Surgical History:  Procedure Laterality Date  . 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 CARDIAC CATHETERIZATION    . CARDIOVERSION  10/2019  . cataract surgery Bilateral 05/24/2014   second eye 06/07/2014  . COLONOSCOPY WITH PROPOFOL N/A 09/24/2019   Procedure: COLONOSCOPY WITH PROPOFOL;  Surgeon: [name], MD;  Location: [facility] ENDOSCOPY;  Service: Gastroenterology;  Laterality: N/A;  . COLONOSCOPY WITH PROPOFOL N/A 12/10/2019   Procedure: COLONOSCOPY WITH PROPOFOL;  Surgeon: [name], MD;  Location: [facility] ENDOSCOPY;  Service: Endoscopy;  Laterality: N/A;  . CORONARY ARTERY BYPASS GRAFT    . EYE SURGERY    . MELANOMA EXCISION    . OVARIAN CYST REMOVAL     EXPLORATORY LAPAROTOMY  . TONSILLECTOMY      There were no vitals filed for this visit.   Subjective Assessment - 05/16/20  0919    Subjective  Deb McGowanpresents for OT Rx visit 20/36 to address BLE lipo-lymphedema. Pt denies BLE LE related leg pain. Pt has no new complaints today. Pt in agreement plan to complete goal review ad comparative limb volumetrics.    Pertinent History bilateral transcatheter aortic valve replacement 02/27/2017; melanoma excision; ovarian cyst removal; aortic stenosis, endometrial cancer 2018 w XRT and brachytherapy; HTN, SOB, BLE lipo-lymphedema    Limitations leg swelling and associated pain, decreased standing and walking tolerance >15 minutes    Repetition Increases Symptoms    Special Tests + Stemmer bolaterally    Patient Stated Goals reduce RLE ankle and foot swelling and keep it from gettin worse; replace worn outy custom compression garments.    Pain Onset Other (comment)   > 25 years                       OT Treatments/Exercises (OP) - 05/16/20 0920      ADLs   ADL Education Given Yes      Manual Therapy   Manual Therapy Edema management;Other (comment)   Repeat FOTO SCORE 05/16/20 = 58/100. Up 9 Pts since 01/28/20   Edema Management comparative limb volumetrics- LLE    Manual Lymphatic Drainage (MLD) MLD to LLE utilizing short neck sequence, deep abdominal breathing, functional inguinal LN and leg  strokes from proximal to distal . Fibrosis techniques to medial and lateral ankle.    Compression Bandaging LLE gradient compression wraps as established- knee length                  OT Education - 05/16/20 0922    Education Details Continued skilled Pt/caregiver education  And LE ADL training throughout visit for lymphedema self care/ home program, including compression wrapping, compression garment and device wear/care, lymphatic pumping ther ex, simple self-MLD, and skin care. Discussed progress towards goals.    Person(s) Educated Patient    Methods Explanation;Demonstration    Comprehension Verbalized understanding;Returned demonstration;Need further  instruction               OT Long Term Goals - 05/16/20 0923      OT LONG TERM GOAL #1   Title Pt modified independent w/ lymphedema precautions/prevention principals and using printed reference to limit LE progression and infection risk.    Baseline min A    Time 4    Period Days   4th OT Rx visit   Status Achieved      OT LONG TERM GOAL #2   Title Lymphedema (LE) management/ self-care: Pt able to apply knee -length gradient compression wraps with extra time and printed reference PRN (modified independence) within 10 visits to achieve optimal limb volume reduction during Intensive Phase CDT.    Baseline Max A    Time 4    Period Days    Status Achieved      OT LONG TERM GOAL #3   Title Lymphedema (LE) management/ self-care:  Pt to achieve at least 5% limb volume reductions below B knees during Intensive Phase CDT to limit LE progression, to improve tissue integrity, to decrease infection risk and to improve functional arm and hand use essential for basic and instrumental ADLs performance.    Baseline Mod A    Time 12    Period Weeks    Status Partially Met   Met for RLE with 11.1% reduction measured 3/'24/22   Target Date 07/27/20      OT LONG TERM GOAL #4   Title Lymphedema (LE) management/ self-care:  Pt to tolerate daily compression wraps, compression garments and/ or HOS devices in keeping w/ prescribed wear regime within 1 week of issue date of each to progress and retain clinical and functional gains and to limit LE progression.    Baseline Max A    Time 12    Period Weeks    Status On-going    Target Date 07/27/20      OT LONG TERM GOAL #5   Title Given Pt's intake FOTO score of 49/100 , Pt will likely gain 7 points on functional scale by DC.    Baseline dependent    Time 12    Period Weeks    Status Achieved   Initial Foto score measured 49/100. On 2/10 it measured 12 points higher. Today. 05/16/20 Foto measures 58/100, which is a 7 pt increase from intake. This  value meets the goal.   Target Date --   met with 9 Pt increase on 20th OT visit on 05/16/20                Plan - 05/16/20 0929    Clinical Impression Statement Pt repeated FOTO survey today to determine if a change has occurred in fnctional performace rating by Pt since commencing OT for CDT. Intake FOTO score measured 49/100, and then on  10th visi it increased by 12 points to 61. Although FOTO score fluctuated downward 3 points today to 58, overall gain measured on FOTO measures 9 points to date. This gain meets the 7 pt increase goal. Limb volumetrics of RLE measured on 05/05/20 reveal a volume decrease of 11.1% since recommencing OT for CDT. This value meets and excedes the 5 percent reduction goal.Volumetrics for the RLE reveals limb volume has essentially remained unchanged with 1.07% fluctuation upwards. RLE goal not met to date. Pt has been fit with RLE custom compression stocking replacements, and LLE garments have been ordered. PLease refer to LONG TERM GOALS section for additional details for progress. Once all garments are fit and volumes remain stable, retuce OT fr LE care to follow along frequency q 6 months and PRN.    OT Occupational Profile and History Comprehensive Assessment- Review of records and extensive additional review of physical, cognitive, psychosocial history related to current functional performance    Occupational performance deficits (Please refer to evaluation for details): ADL's;IADL's;Work;Leisure;Social Participation;Other    Rehab Potential Good    Clinical Decision Making Several treatment options, min-mod task modification necessary    Comorbidities Affecting Occupational Performance: Presence of comorbidities impacting occupational performance    Modification or Assistance to Complete Evaluation  Min-Moderate modification of tasks or assist with assess necessary to complete eval    OT Frequency 2x / week    OT Duration 12 weeks    OT Treatment/Interventions  Self-care/ADL training;DME and/or AE instruction;Manual lymph drainage;Compression bandaging;Therapeutic activities;Therapeutic exercise;Other (comment);Manual Therapy;Patient/family education    Plan CDT to BLE: MLD, skin care, compression wraps and/ or garments, ther ex    Recommended Other Services replace existing custom, ccl 2 Elvarex flat knit knee high compression stockings    Consulted and Agree with Plan of Care Patient           Patient will benefit from skilled therapeutic intervention in order to improve the following deficits and impairments:           Visit Diagnosis: Lymphedema, not elsewhere classified    Problem List Patient Active Problem List   Diagnosis Date Noted  . Positive colorectal cancer screening using Cologuard test   . Cecal polyp   . Polyp of sigmoid colon   . Paroxysmal atrial fibrillation ([place]) 10/02/2019  . Thoracic aortic ectasia ([place]) 08/31/2019  . Monitoring for anticoagulant use 01/06/2019  . Diverticulosis of colon 08/08/2018  . Cholelithiasis 08/08/2018  . Endometrial cancer ([place]) 04/10/2017  . 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 Chronic acquired lymphedema 01/08/2017  . Obesity, morbid, BMI 40.0-49.9 ([place]) 11/13/2016  . FIGO stage II endometrial cancer ([place]) 10/29/2016  . Left ventricular hypertrophy 11/27/2014  . Lymphedema of lower extremity 08/23/2014  . Severe aortic stenosis 08/21/2014  . Benign essential HTN 08/21/2014  . Depression, major, recurrent, mild ([place]) 08/21/2014  . Dyslipidemia 08/21/2014  . Cardiac dysrhythmia 08/21/2014  . Dysmetabolic syndrome 96/43/8381  . Symptomatic menopausal or female climacteric states 08/21/2014  . 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 History of shingles 08/21/2014  . Personal history of skin cancer 11/24/2009    Andrey Spearman, MS, OTR/L, [facility] 05/16/20 4:52 PM   [place] MAIN [facility] SERVICES [address] [place], [place], [address] Phone: [phone]   Fax:  [phone]  Name: NIMRA PUCCINELLI MRN: [id number] Date of Birth: 02-04-1951

## 2020-05-18 ENCOUNTER — Ambulatory Visit: Payer: Medicare HMO | Admitting: Occupational Therapy

## 2020-05-23 ENCOUNTER — Other Ambulatory Visit: Payer: Self-pay

## 2020-05-23 ENCOUNTER — Ambulatory Visit: Payer: Medicare HMO | Admitting: Occupational Therapy

## 2020-05-23 DIAGNOSIS — I890 Lymphedema, not elsewhere classified: Secondary | ICD-10-CM | POA: Diagnosis not present

## 2020-05-23 NOTE — Therapy (Signed)
[place] MAIN [facility] SERVICES [address] [place], [place], [address] Phone: [phone]   Fax:  [phone]  Occupational Therapy Treatment  Patient Details  Name: Melody Soto MRN: [id number] Date of Birth: [birth year] Referring Provider (OT): adolescence by report   Encounter Date: 05/23/2020   OT End of Session - 05/23/20 0918    Visit Number 21    Number of Visits 36    Date for OT Re-Evaluation 07/27/20    OT Start Time 0904    OT Stop Time 1010    OT Time Calculation (min) 66 min    Activity Tolerance No increased pain;Patient tolerated treatment well    Behavior During Therapy [facility] for tasks assessed/performed           Past Medical History:  Diagnosis Date  . Aortic stenosis   . Cataract   . Depression   . Dysrhythmia   . Endometrial cancer determined by uterine biopsy ([place]) 10/29/2016   Rad tx's and internal brachytherapy.  Marland Kitchen Heart murmur    HX OF  . Hyperlipidemia   . Hypertension    CONTROLLED ON MEDS  . Lymphedema   . Shortness of breath dyspnea     Past Surgical History:  Procedure Laterality Date  . 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 CARDIAC CATHETERIZATION    . CARDIOVERSION  10/2019  . cataract surgery Bilateral 05/24/2014   second eye 06/07/2014  . COLONOSCOPY WITH PROPOFOL N/A 09/24/2019   Procedure: COLONOSCOPY WITH PROPOFOL;  Surgeon: [name], MD;  Location: [facility] ENDOSCOPY;  Service: Gastroenterology;  Laterality: N/A;  . COLONOSCOPY WITH PROPOFOL N/A 12/10/2019   Procedure: COLONOSCOPY WITH PROPOFOL;  Surgeon: [name], MD;  Location: [facility] ENDOSCOPY;  Service: Endoscopy;  Laterality: N/A;  . CORONARY ARTERY BYPASS GRAFT    . EYE SURGERY    . MELANOMA EXCISION    . OVARIAN CYST REMOVAL     EXPLORATORY LAPAROTOMY  . TONSILLECTOMY      There were no vitals filed for this visit.   Subjective Assessment - 05/23/20 0920    Subjective  Melody McGowanpresents  for OT Rx visit 21/36 to address BLE lipo-lymphedema. Pt denies BLE LE related leg pain. Marland Kitchen Pt in agreement plan to complete goal review ad comparative limb volumetrics.    Pertinent History bilateral transcatheter aortic valve replacement 02/27/2017; melanoma excision; ovarian cyst removal; aortic stenosis, endometrial cancer 2018 w XRT and brachytherapy; HTN, SOB, BLE lipo-lymphedema    Limitations leg swelling and associated pain, decreased standing and walking tolerance >15 minutes    Repetition Increases Symptoms    Special Tests + Stemmer bolaterally    Patient Stated Goals reduce RLE ankle and foot swelling and keep it from gettin worse; replace worn outy custom compression garments.    Pain Onset Other (comment)   > 25 years                       OT Treatments/Exercises (OP) - 05/23/20 1622      ADLs   ADL Education Given Yes      Manual Therapy   Manual Therapy Edema management;Other (comment);Manual Lymphatic Drainage (MLD)   Repeat FOTO SCORE 05/16/20 = 58/100. Up 9 Pts since 01/28/20   Edema Management custom compression garment and device fitting- 2nd RLE knee high, and initial LLE RELAX and elvarex ccl 2 knee high.    Manual Lymphatic Drainage (MLD) MLD to LLE utilizing short neck sequence,  deep abdominal breathing, functional inguinal LN and leg strokes from proximal to distal . Fibrosis techniques to medial and lateral ankle.    Compression Bandaging LLE gradient compression wraps as established- knee length                  OT Education - 05/23/20 1624    Education Details Continued skilled Pt/caregiver education  And LE ADL training throughout visit for lymphedema self care/ home program, including compression wrapping, compression garment and device wear/care, lymphatic pumping ther ex, simple self-MLD, and skin care. Discussed progress towards goals.    Person(s) Educated Patient    Methods Explanation;Demonstration    Comprehension Verbalized  understanding;Returned demonstration;Need further instruction               OT Long Term Goals - 05/16/20 0923      OT LONG TERM GOAL #1   Title Pt modified independent w/ lymphedema precautions/prevention principals and using printed reference to limit LE progression and infection risk.    Baseline min A    Time 4    Period Days   4th OT Rx visit   Status Achieved      OT LONG TERM GOAL #2   Title Lymphedema (LE) management/ self-care: Pt able to apply knee -length gradient compression wraps with extra time and printed reference PRN (modified independence) within 10 visits to achieve optimal limb volume reduction during Intensive Phase CDT.    Baseline Max A    Time 4    Period Days    Status Achieved      OT LONG TERM GOAL #3   Title Lymphedema (LE) management/ self-care:  Pt to achieve at least 5% limb volume reductions below B knees during Intensive Phase CDT to limit LE progression, to improve tissue integrity, to decrease infection risk and to improve functional arm and hand use essential for basic and instrumental ADLs performance.    Baseline Mod A    Time 12    Period Weeks    Status Partially Met   Met for RLE with 11.1% reduction measured 3/'24/22   Target Date 07/27/20      OT LONG TERM GOAL #4   Title Lymphedema (LE) management/ self-care:  Pt to tolerate daily compression wraps, compression garments and/ or HOS devices in keeping w/ prescribed wear regime within 1 week of issue date of each to progress and retain clinical and functional gains and to limit LE progression.    Baseline Max A    Time 12    Period Weeks    Status On-going    Target Date 07/27/20      OT LONG TERM GOAL #5   Title Given Pt's intake FOTO score of 49/100 , Pt will likely gain 7 points on functional scale by DC.    Baseline dependent    Time 12    Period Weeks    Status Achieved   Initial Foto score measured 49/100. On 2/10 it measured 12 points higher. Today. 05/16/20 Foto measures  58/100, which is a 7 pt increase from intake. This value meets the goal.   Target Date --   met with 9 Pt increase on 20th OT visit on 05/16/20                Plan - 05/23/20 1617    Clinical Impression Statement Completed fitting for second RLE knee high compression garment, and determine it is a good fit. Also completed fitting for initial LLE garment  and HOS device. Both are excelent fit. Reviewed wear and care regimes. Remainder of session spent on LLE MLD and concurrent skin care to improve hydration . Pt will wear new garments and devices and will functionally assess. If garments are comfortable and effective, and if LE remains stable, we'l reduce frequency to follow along status with visits q 3-6 months and PRN. Pt is nearing end of Intensive Phase of CDT and is about to transition on to self-jmanagement phase after fittings are complete. Cont as per POC.    OT Occupational Profile and History Comprehensive Assessment- Review of records and extensive additional review of physical, cognitive, psychosocial history related to current functional performance    Occupational performance deficits (Please refer to evaluation for details): ADL's;IADL's;Work;Leisure;Social Participation;Other    Rehab Potential Good    Clinical Decision Making Several treatment options, min-mod task modification necessary    Comorbidities Affecting Occupational Performance: Presence of comorbidities impacting occupational performance    Modification or Assistance to Complete Evaluation  Min-Moderate modification of tasks or assist with assess necessary to complete eval    OT Frequency 2x / week    OT Duration 12 weeks    OT Treatment/Interventions Self-care/ADL training;DME and/or AE instruction;Manual lymph drainage;Compression bandaging;Therapeutic activities;Therapeutic exercise;Other (comment);Manual Therapy;Patient/family education    Plan CDT to BLE: MLD, skin care, compression wraps and/ or garments, ther ex     Recommended Other Services replace existing custom, ccl 2 Elvarex flat knit knee high compression stockings    Consulted and Agree with Plan of Care Patient           Patient will benefit from skilled therapeutic intervention in order to improve the following deficits and impairments:           Visit Diagnosis: Lymphedema, not elsewhere classified    Problem List Patient Active Problem List   Diagnosis Date Noted  . Positive colorectal cancer screening using Cologuard test   . Cecal polyp   . Polyp of sigmoid colon   . Paroxysmal atrial fibrillation ([place]) 10/02/2019  . Thoracic aortic ectasia ([place]) 08/31/2019  . Monitoring for anticoagulant use 01/06/2019  . Diverticulosis of colon 08/08/2018  . Cholelithiasis 08/08/2018  . Endometrial cancer ([place]) 04/10/2017  . 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 Chronic acquired lymphedema 01/08/2017  . Obesity, morbid, BMI 40.0-49.9 ([place]) 11/13/2016  . FIGO stage II endometrial cancer ([place]) 10/29/2016  . Left ventricular hypertrophy 11/27/2014  . Lymphedema of lower extremity 08/23/2014  . Severe aortic stenosis 08/21/2014  . Benign essential HTN 08/21/2014  . Depression, major, recurrent, mild ([place]) 08/21/2014  . Dyslipidemia 08/21/2014  . Cardiac dysrhythmia 08/21/2014  . Dysmetabolic syndrome 71/85/5015  . Symptomatic menopausal or female climacteric states 08/21/2014  . 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 History of shingles 08/21/2014  . Personal history of skin cancer 11/24/2009   Andrey Spearman, MS, OTR/L, [facility] 05/23/20 4:25 PM    [place] MAIN [facility] SERVICES [address] [place], [place], [address] Phone: [phone]   Fax:  [phone]  Name: MATISHA TERMINE MRN: [id number] Date of Birth: 09-Apr-1950

## 2020-05-25 ENCOUNTER — Ambulatory Visit: Payer: Medicare HMO | Admitting: Occupational Therapy

## 2020-05-27 ENCOUNTER — Encounter: Payer: Medicare HMO | Admitting: Occupational Therapy

## 2020-05-30 ENCOUNTER — Ambulatory Visit: Payer: Medicare HMO | Admitting: Occupational Therapy

## 2020-06-01 ENCOUNTER — Ambulatory Visit: Payer: Medicare HMO | Admitting: Occupational Therapy

## 2020-06-02 ENCOUNTER — Ambulatory Visit: Payer: Medicare HMO | Admitting: Occupational Therapy

## 2020-06-03 ENCOUNTER — Other Ambulatory Visit: Payer: Self-pay | Admitting: Family Medicine

## 2020-06-03 DIAGNOSIS — E785 Hyperlipidemia, unspecified: Secondary | ICD-10-CM

## 2020-06-05 MED ORDER — ATORVASTATIN CALCIUM 40 MG PO TABS: 40.0000 mg | ORAL_TABLET | ORAL | 0 refills | Status: DC

## 2020-06-06 ENCOUNTER — Ambulatory Visit: Payer: Medicare HMO | Admitting: Occupational Therapy

## 2020-06-06 ENCOUNTER — Other Ambulatory Visit: Payer: Self-pay

## 2020-06-06 DIAGNOSIS — I890 Lymphedema, not elsewhere classified: Secondary | ICD-10-CM | POA: Diagnosis not present

## 2020-06-06 NOTE — Therapy (Signed)
[place] MAIN [facility] SERVICES [address] [place], [place], [address] Phone: [phone]   Fax:  [phone]  Occupational Therapy Treatment  Patient Details  Name: Melody Soto MRN: [id number] Date of Birth: 07-01-1950 Referring Provider (OT): adolescence by report   Encounter Date: 06/06/2020   OT End of Session - 06/06/20 1117    Visit Number 22    Number of Visits 36    Date for OT Re-Evaluation 07/27/20    OT Start Time 1111    OT Stop Time 1215    OT Time Calculation (min) 64 min    Activity Tolerance No increased pain;Patient tolerated treatment well    Behavior During Therapy [facility] for tasks assessed/performed           Past Medical History:  Diagnosis Date  . Aortic stenosis   . Cataract   . Depression   . Dysrhythmia   . Endometrial cancer determined by uterine biopsy ([place]) 10/29/2016   Rad tx's and internal brachytherapy.  Marland Kitchen Heart murmur    HX OF  . Hyperlipidemia   . Hypertension    CONTROLLED ON MEDS  . Lymphedema   . Shortness of breath dyspnea     Past Surgical History:  Procedure Laterality Date  . 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 CARDIAC CATHETERIZATION    . CARDIOVERSION  10/2019  . cataract surgery Bilateral 05/24/2014   second eye 06/07/2014  . COLONOSCOPY WITH PROPOFOL N/A 09/24/2019   Procedure: COLONOSCOPY WITH PROPOFOL;  Surgeon: [name], MD;  Location: [facility] ENDOSCOPY;  Service: Gastroenterology;  Laterality: N/A;  . COLONOSCOPY WITH PROPOFOL N/A 12/10/2019   Procedure: COLONOSCOPY WITH PROPOFOL;  Surgeon: [name], MD;  Location: [facility] ENDOSCOPY;  Service: Endoscopy;  Laterality: N/A;  . CORONARY ARTERY BYPASS GRAFT    . EYE SURGERY    . MELANOMA EXCISION    . OVARIAN CYST REMOVAL     EXPLORATORY LAPAROTOMY  . TONSILLECTOMY      There were no vitals filed for this visit.   Subjective Assessment - 06/06/20 1239    Subjective  Melody McGowanpresents  for OT Rx visit 22//36 to address BLE lipo-lymphedema. Pt denies BLE LE related leg pain. Pt reports garments and device fit last session fit well are comfortable and hse is able to don independently.    Pertinent History bilateral transcatheter aortic valve replacement 02/27/2017; melanoma excision; ovarian cyst removal; aortic stenosis, endometrial cancer 2018 w XRT and brachytherapy; HTN, SOB, BLE lipo-lymphedema    Limitations leg swelling and associated pain, decreased standing and walking tolerance >15 minutes    Repetition Increases Symptoms    Special Tests Final Intensive Phase FOTO score =60/100. This 11 point gain in function meets and exceeds 7Point increase goal.    Patient Stated Goals reduce RLE ankle and foot swelling and keep it from gettin worse; replace worn outy custom compression garments.    Currently in Pain? No/denies    Pain Onset Other (comment)   > 25 years                       OT Treatments/Exercises (OP) - 06/06/20 1240      ADLs   ADL Education Given Yes      Manual Therapy   Manual Therapy Edema management;Other (comment);Manual Lymphatic Drainage (MLD)   Repeat FOTO SCORE 05/16/20 = 58/100. Up 9 Pts since 01/28/20   Manual Lymphatic Drainage (MLD) MLD to LLE utilizing  short neck sequence, deep abdominal breathing, functional inguinal LN and leg strokes from proximal to distal . Fibrosis techniques to medial and lateral ankle.    Compression Bandaging LLE gradient compression wraps as established- knee length                  OT Education - 06/06/20 1241    Education Details Continued skilled Pt/caregiver education  And LE ADL training throughout visit for lymphedema self care/ home program, including compression wrapping, compression garment and device wear/care, lymphatic pumping ther ex, simple self-MLD, and skin care. Discussed progress towards goals.    Person(s) Educated Patient    Methods Explanation;Demonstration    Comprehension  Verbalized understanding;Returned demonstration               OT Long Term Goals - 06/06/20 1235      OT LONG TERM GOAL #1   Title Pt modified independent w/ lymphedema precautions/prevention principals and using printed reference to limit LE progression and infection risk.    Baseline min A    Time 4    Period Days   4th OT Rx visit   Status Achieved      OT LONG TERM GOAL #2   Title Lymphedema (LE) management/ self-care: Pt able to apply knee -length gradient compression wraps with extra time and printed reference PRN (modified independence) within 10 visits to achieve optimal limb volume reduction during Intensive Phase CDT.    Baseline Max A    Time 4    Period Days    Status Achieved      OT LONG TERM GOAL #3   Title Lymphedema (LE) management/ self-care:  Pt to achieve at least 5% limb volume reductions below B knees during Intensive Phase CDT to limit LE progression, to improve tissue integrity, to decrease infection risk and to improve functional arm and hand use essential for basic and instrumental ADLs performance.    Baseline Mod A    Time 12    Period Weeks    Status Partially Met   Met for RLE with 11.1% reduction measured 3/'24/22   Target Date 07/27/20      OT LONG TERM GOAL #4   Title Lymphedema (LE) management/ self-care:  Pt to tolerate daily compression wraps, compression garments and/ or HOS devices in keeping w/ prescribed wear regime within 1 week of issue date of each to progress and retain clinical and functional gains and to limit LE progression.    Baseline Max A    Time 12    Period Weeks    Status Achieved      OT LONG TERM GOAL #5   Title Given Pt's intake FOTO score of 49/100 , Pt will likely gain 7 points on functional scale by DC.    Baseline dependent    Time 12    Period Weeks    Status Achieved   Initial Foto score measured 49/100. On 2/10 it measured 12 points higher. Today. 05/16/20 Foto measures 58/100, which is a 7 pt increase from  intake. This value meets the goal. Final Intensive Phase FOTO score = 59.9/100.                Plan - 06/06/20 1243    Clinical Impression Statement Custom compression garment fitting is complete. All garments delivered thus far fit and function appropriately and Pt is able to dona nd doff all independently. Pt is  diligently compliant with daily compression. DME vendor notified OK to send  2nd LLE stocking using current measurements without alterations. Leg swelling is well controlled at this stage of treatment. Skin is in excellent condition, and limb volume is stable. Pt in agreement with plan to transition to self management phase of CDT after this visit reducing to follow along visit frequency and PRN. Her final Intensive Phase FOTO score reveals an increase in function is increased by 11 points to 60/100 since recommncing OT for lymphedema care . Pt will return in 4 months for follow along and will call PRN..    OT Occupational Profile and History Comprehensive Assessment- Review of records and extensive additional review of physical, cognitive, psychosocial history related to current functional performance    Occupational performance deficits (Please refer to evaluation for details): ADL's;IADL's;Work;Leisure;Social Participation;Other    Rehab Potential Good    Clinical Decision Making Several treatment options, min-mod task modification necessary    Comorbidities Affecting Occupational Performance: Presence of comorbidities impacting occupational performance    Modification or Assistance to Complete Evaluation  Min-Moderate modification of tasks or assist with assess necessary to complete eval    OT Frequency 2x / week    OT Duration 12 weeks    OT Treatment/Interventions Self-care/ADL training;DME and/or AE instruction;Manual lymph drainage;Compression bandaging;Therapeutic activities;Therapeutic exercise;Other (comment);Manual Therapy;Patient/family education    Plan CDT to BLE: MLD,  skin care, compression wraps and/ or garments, ther ex    Recommended Other Services replace existing custom, ccl 2 Elvarex flat knit knee high compression stockings    Consulted and Agree with Plan of Care Patient           Patient will benefit from skilled therapeutic intervention in order to improve the following deficits and impairments:           Visit Diagnosis: Lymphedema, not elsewhere classified    Problem List Patient Active Problem List   Diagnosis Date Noted  . Positive colorectal cancer screening using Cologuard test   . Cecal polyp   . Polyp of sigmoid colon   . Paroxysmal atrial fibrillation ([place]) 10/02/2019  . Thoracic aortic ectasia ([place]) 08/31/2019  . Monitoring for anticoagulant use 01/06/2019  . Diverticulosis of colon 08/08/2018  . Cholelithiasis 08/08/2018  . Endometrial cancer ([place]) 04/10/2017  . 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 Chronic acquired lymphedema 01/08/2017  . Obesity, morbid, BMI 40.0-49.9 ([place]) 11/13/2016  . FIGO stage II endometrial cancer ([place]) 10/29/2016  . Left ventricular hypertrophy 11/27/2014  . Lymphedema of lower extremity 08/23/2014  . Severe aortic stenosis 08/21/2014  . Benign essential HTN 08/21/2014  . Depression, major, recurrent, mild ([place]) 08/21/2014  . Dyslipidemia 08/21/2014  . Cardiac dysrhythmia 08/21/2014  . Dysmetabolic syndrome 02/72/5366  . Symptomatic menopausal or female climacteric states 08/21/2014  . 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 History of shingles 08/21/2014  . Personal history of skin cancer 11/24/2009    Andrey Spearman, MS, OTR/L, [facility] 06/06/20 12:50 PM  [place] MAIN [facility] SERVICES [address] [place], [place], [address] Phone: [phone]   Fax:  [phone]  Name: Melody Soto MRN: [id number] Date of Birth: Jun 07, 1950

## 2020-06-08 ENCOUNTER — Ambulatory Visit: Payer: Medicare HMO | Admitting: Occupational Therapy

## 2020-06-09 ENCOUNTER — Ambulatory Visit: Payer: Medicare HMO | Admitting: Occupational Therapy

## 2020-06-13 ENCOUNTER — Ambulatory Visit: Payer: Medicare HMO | Admitting: Occupational Therapy

## 2020-06-16 ENCOUNTER — Encounter: Payer: Medicare HMO | Admitting: Occupational Therapy

## 2020-06-20 ENCOUNTER — Encounter: Payer: Medicare HMO | Admitting: Occupational Therapy

## 2020-06-20 DIAGNOSIS — H524 Presbyopia: Secondary | ICD-10-CM | POA: Diagnosis not present

## 2020-06-20 NOTE — Progress Notes (Signed)
Name: Melody Soto   MRN: [id number]    DOB: 04/19/1950   Date:06/21/2020       Progress Note  Subjective  Chief Complaint  Follow Up  HPI  History of endometrial cancer:  seeing Dr. Donella Stade yearly now, last visit 05/2020 and last visit with surgical oncologist Dr. Alto Denver 07/2020 . She denies any vaginal bleeding or pelvic pain.   HTN:she is taking Micardis, Toprol XL and denies side effects. No chest pain or palpitation   Morbid obesity: she went on Keto diet since she saw her A1C was 6.1 % back in March 2021 , she is on intermittent fasting also. Weight down from 295 lbs March 2021 to 236 lbs today in our office . Lost 19 lbs over the past 6 months. She states she is very compliant with her diet. Colonoscopy is up to date   Metabolic syndrome:last [id number] was up to 6.1 % Denies polyphagia, polyuria or polydipsia.She has been losing weigh ton low carb diet   Lymphedema: she responded well to PT ,she has been wearing compression stocking hoses, she only wears pumps occasionally . Stable   S/p TAVR: she had surgery done at [facility] on 02/27/2017 for severe aortic stenosis. She is on Toprol XL No palpitation, chest pain or orthopnea. Reviewed last echo, she developed thrombus of aorta but resolved since, she has aorta atherosclerosis  Last Echo 09/2019 at [place] ---------------------------------------------------------------  SEVERE LV DYSFUNCTION (See above)  MILD RV SYSTOLIC DYSFUNCTION (See above)  VALVULAR REGURGITATION: MILD AR, MILD MR, TRIVIAL PR, MILD TR  DILATED ASCENDING AORTA 3.8 cm.  S/P TAVR WITH MILD-MODERATE PARAVALVULAR AORTIC REGURGITATION, UNCHANGED  FROM PRIOR.     Echo was 12/2018  INTERPRETATION ---------------------------------------------------------------   NORMAL LEFT VENTRICULAR SYSTOLIC FUNCTION WITH MILD LVH   NORMAL RIGHT VENTRICULAR SYSTOLIC FUNCTION   VALVULAR REGURGITATION: MILD AR, TRIVIAL MR, TRIVIAL PR   S/P TAVR  WITH MILD-TO-MODERATE PARAVALVULAR REGURGITATION   ASCENDING AO FLOW TURBULENCE ( PEAK VEL. 2.7 M/S)  Transesophageal echo done at [facility] 11/09/2019  CONCLUSIONS ------------------------------------------------------------------  1. No LAA thrombus  2. Moderate LV dysfunction  3. Well-seated TAVR, good mobility. Trivial paravalvular AR  4. Mild MR     Depression: she took Celexa many years ago She is still unable to see her grand-daughter. She has not been able to see her daughter in the past 2 years     Urinary incontinence: she was seen by Urologist , tried medication but did not improve symptoms, so she stopped taking it, she did not have PT< but losing weight has helped   Atrial fibrillation: found during colonoscopy this Summer, procedure was scheuduled for 09/09/2019 but was cancelled due to Afib. She went to [facility], had multiple procedures done and cardioversion . She is now on Toprol , Spirolactone, Jardiance and Tikosyn since the Summer, rhythm has been controlled. Also on Eliquis instead of coumadin. She denies sob, palpitation , denies chest pain . They tried cardioversion twice but only stayed on heart rhythm for less than two weeks. She states she is trying to lose weight to stay healthy   Aorta dilation:  On echo done in 2021 and also CT chest, we will send note to Dr. Clayborn Bigness , so we don't have to refer to a cardiovascular surgeon at this time    Patient Active Problem List   Diagnosis Date Noted  . Cecal polyp   . Polyp of sigmoid colon   . Paroxysmal atrial fibrillation ([place]) 10/02/2019  .  Thoracic aortic ectasia ([place]) 08/31/2019  . Monitoring for anticoagulant use 01/06/2019  . Diverticulosis of colon 08/08/2018  . Cholelithiasis 08/08/2018  . Endometrial cancer ([place]) 04/10/2017  . 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 Chronic acquired lymphedema 01/08/2017  . Obesity, morbid, BMI 40.0-49.9 ([place]) 11/13/2016  . FIGO stage II  endometrial cancer ([place]) 10/29/2016  . Left ventricular hypertrophy 11/27/2014  . Lymphedema of lower extremity 08/23/2014  . Severe aortic stenosis 08/21/2014  . Benign essential HTN 08/21/2014  . Depression, major, recurrent, mild ([place]) 08/21/2014  . Dyslipidemia 08/21/2014  . Cardiac dysrhythmia 08/21/2014  . Dysmetabolic syndrome 24/26/8341  . Symptomatic menopausal or female climacteric states 08/21/2014  . 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 History of shingles 08/21/2014  . Personal history of skin cancer 11/24/2009    Past Surgical History:  Procedure Laterality Date  . 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 CARDIAC CATHETERIZATION    . CARDIOVERSION  10/2019  . cataract surgery Bilateral 05/24/2014   second eye 06/07/2014  . COLONOSCOPY WITH PROPOFOL N/A 09/24/2019   Procedure: COLONOSCOPY WITH PROPOFOL;  Surgeon: [name], MD;  Location: [facility] ENDOSCOPY;  Service: Gastroenterology;  Laterality: N/A;  . COLONOSCOPY WITH PROPOFOL N/A 12/10/2019   Procedure: COLONOSCOPY WITH PROPOFOL;  Surgeon: [name], MD;  Location: [facility] ENDOSCOPY;  Service: Endoscopy;  Laterality: N/A;  . CORONARY ARTERY BYPASS GRAFT    . EYE SURGERY    . MELANOMA EXCISION    . OVARIAN CYST REMOVAL     EXPLORATORY LAPAROTOMY  . TONSILLECTOMY      Family History  Problem Relation Age of Onset  . Stroke Mother   . Thyroid nodules Mother   . Lung cancer Mother   . Diabetes Sister   . Hypertension Sister   . Aortic aneurysm Father   . Breast cancer Maternal Aunt   . Bone cancer Maternal Uncle   . Depression Daughter   . Depression Daughter     Social History   Tobacco Use  . Smoking status: Never Smoker  . Smokeless tobacco: Never Used  Substance Use Topics  . Alcohol use: Not Currently    Alcohol/week: 0.0 standard drinks     Current Outpatient Medications:  .  apixaban (ELIQUIS) 5 MG TABS tablet, Take by mouth., Disp: , Rfl:  .   Ascorbic Acid (VITAMIN C) 500 MG CAPS, Take 1,000 mg by mouth in the morning and at bedtime., Disp: , Rfl:  .  atorvastatin (LIPITOR) 40 MG tablet, Take 1 tablet (40 mg total) by mouth 3 (three) times a week., Disp: 36 tablet, Rfl: 0 .  B Complex Vitamins (B COMPLEX 1 PO), Take by mouth., Disp: , Rfl:  .  Cholecalciferol (VITAMIN D-3) 125 MCG (5000 UT) TABS, Take 3 tablets by mouth daily., Disp: , Rfl:  .  dofetilide (TIKOSYN) 125 MCG capsule, Take by mouth., Disp: , Rfl:  .  JARDIANCE 10 MG TABS tablet, Take 10 mg by mouth daily., Disp: , Rfl:  .  Magnesium 500 MG CAPS, Take 800 mg by mouth in the morning and at bedtime. , Disp: , Rfl:  .  metoprolol succinate (TOPROL-XL) 50 MG 24 hr tablet, Take 50 mg by mouth 2 (two) times daily., Disp: , Rfl:  .  spironolactone (ALDACTONE) 25 MG tablet, Take 25 mg by mouth 2 (two) times daily., Disp: , Rfl:  .  telmisartan (MICARDIS) 40 MG tablet, TAKE 1 TABLET BY MOUTH EVERY DAY, Disp: 90 tablet, Rfl:  1  Allergies  Allergen Reactions  . Ace Inhibitors Other (See Comments)    UNKNOWN REACTION  . Duloxetine Other (See Comments)    Drowsiness    I personally reviewed active problem list, medication list, allergies, family history, social history, health maintenance with the patient/caregiver today.   ROS  Constitutional: Negative for fever, positive for  weight change.  Respiratory: Negative for cough and shortness of breath.   Cardiovascular: Negative for chest pain or palpitations.  Gastrointestinal: Negative for abdominal pain, no bowel changes.  Musculoskeletal: Negative for gait problem or joint swelling.  Skin: Negative for rash.  Neurological: Negative for dizziness or headache.  No other specific complaints in a complete review of systems (except as listed in HPI above).  Objective  Vitals:   06/21/20 1321  BP: 132/84  Pulse: 78  Resp: 16  Temp: 98.4 F (36.9 C)  SpO2: 96%  Weight: 236 lb 9.6 oz (107.3 kg)  Height: 5\' 9"  (1.753  m)    Body mass index is 34.94 kg/m.  Physical Exam  Constitutional: Patient appears well-developed and well-nourished. Obese  No distress.  HEENT: head atraumatic, normocephalic, pupils equal and reactive to light, neck supple Cardiovascular: Normal rate, regular rhythm and normal heart sounds.  No murmur heard. lymphedema. Pulmonary/Chest: Effort normal and breath sounds normal. No respiratory distress. Abdominal: Soft.  There is no tenderness. Psychiatric: Patient has a normal mood and affect. behavior is normal. Judgment and thought content normal.  PHQ2/9: Depression screen [facility] 2/9 06/21/2020 12/21/2019 12/21/2019 08/11/2019 05/06/2019  Decreased Interest 0 0 0 0 1  Down, Depressed, Hopeless 0 0 0 0 0  PHQ - 2 Score 0 0 0 0 1  Altered sleeping 0 0 - - 0  Tired, decreased energy 0 1 - - 1  Change in appetite 0 0 - - 0  Feeling bad or failure about yourself  0 0 - - 0  Trouble concentrating 0 0 - - 0  Moving slowly or fidgety/restless 0 0 - - 0  Suicidal thoughts 0 0 - - 0  PHQ-9 Score 0 1 - - 2  Difficult doing work/chores Not difficult at all Not difficult at all - - Not difficult at all  Some recent data might be hidden    phq 9 is negative   Fall Risk: Fall Risk  06/21/2020 12/21/2019 08/11/2019 07/31/2019 05/06/2019  Falls in the past year? 0 0 0 0 0  Number falls in past yr: 0 0 0 - 0  Injury with Fall? 0 0 0 - 0  Risk for fall due to : - - Other (Comment) - -  Risk for fall due to: Comment - - lymphadema - -  Follow up - - Falls prevention discussed Falls prevention discussed -     Functional Status Survey: Is the patient deaf or have difficulty hearing?: No Does the patient have difficulty seeing, even when wearing glasses/contacts?: No Does the patient have difficulty concentrating, remembering, or making decisions?: No Does the patient have difficulty walking or climbing stairs?: No Does the patient have difficulty dressing or bathing?: No Does the patient have  difficulty doing errands alone such as visiting a doctor's office or shopping?: No    Assessment & Plan  1. Benign essential HTN  - COMPLETE METABOLIC PANEL WITH GFR - CBC with Differential/Platelet  2. Dysmetabolic syndrome   3. Paroxysmal atrial fibrillation (HCC)   4. Dyslipidemia  - Lipid panel  5. Chronic acquired lymphedema  Doing better with  weight loss   6. S/P TAVR (transcatheter aortic valve replacement)   7. Morbid obesity ([place])  Discussed with the patient the risk posed by an increased BMI. Discussed importance of portion control, calorie counting and at least 150 minutes of physical activity weekly. Avoid sweet beverages and drink more water. Eat at least 6 servings of fruit and vegetables daily   8. History of endometrial cancer   9. Hyperglycemia  - Hemoglobin A1c  10. Combined systolic and diastolic congestive heart failure, unspecified HF chronicity ([place])  Doing well on medical management  11. Thoracic aorta atherosclerosis ([place])  On statin therapy   12. Mild concentric left ventricular hypertrophy (LVH)   13. Mild major depression (HCC)  Stable

## 2020-06-21 ENCOUNTER — Encounter: Payer: Self-pay | Admitting: Family Medicine

## 2020-06-21 ENCOUNTER — Other Ambulatory Visit: Payer: Self-pay

## 2020-06-21 ENCOUNTER — Ambulatory Visit (INDEPENDENT_AMBULATORY_CARE_PROVIDER_SITE_OTHER): Payer: Medicare HMO | Admitting: Family Medicine

## 2020-06-21 VITALS — BP 132/84 | HR 78 | Temp 98.4°F | Resp 16 | Ht 69.0 in | Wt 236.6 lb

## 2020-06-21 DIAGNOSIS — Z8542 Personal history of malignant neoplasm of other parts of uterus: Secondary | ICD-10-CM | POA: Diagnosis not present

## 2020-06-21 DIAGNOSIS — I517 Cardiomegaly: Secondary | ICD-10-CM

## 2020-06-21 DIAGNOSIS — I10 Essential (primary) hypertension: Secondary | ICD-10-CM

## 2020-06-21 DIAGNOSIS — R739 Hyperglycemia, unspecified: Secondary | ICD-10-CM

## 2020-06-21 DIAGNOSIS — E785 Hyperlipidemia, unspecified: Secondary | ICD-10-CM

## 2020-06-21 DIAGNOSIS — I480 Paroxysmal atrial fibrillation: Secondary | ICD-10-CM | POA: Diagnosis not present

## 2020-06-21 DIAGNOSIS — I890 Lymphedema, not elsewhere classified: Secondary | ICD-10-CM | POA: Diagnosis not present

## 2020-06-21 DIAGNOSIS — E8881 Metabolic syndrome: Secondary | ICD-10-CM | POA: Diagnosis not present

## 2020-06-21 DIAGNOSIS — I700 Atherosclerosis of aorta: Secondary | ICD-10-CM

## 2020-06-21 DIAGNOSIS — Z952 Presence of prosthetic heart valve: Secondary | ICD-10-CM | POA: Diagnosis not present

## 2020-06-21 DIAGNOSIS — I5040 Unspecified combined systolic (congestive) and diastolic (congestive) heart failure: Secondary | ICD-10-CM

## 2020-06-21 DIAGNOSIS — F320 Major depressive disorder, single episode, mild: Secondary | ICD-10-CM

## 2020-06-22 LAB — CBC WITH DIFFERENTIAL/PLATELET
Absolute Monocytes: 676 cells/uL (ref 200–950)
Basophils Absolute: 38 cells/uL (ref 0–200)
Basophils Relative: 0.5 %
Eosinophils Absolute: 53 cells/uL (ref 15–500)
Eosinophils Relative: 0.7 %
HCT: 46.9 % — ABNORMAL HIGH (ref 35.0–45.0)
Hemoglobin: 15.4 g/dL (ref 11.7–15.5)
Lymphs Abs: 1474 cells/uL (ref 850–3900)
MCH: 32.3 pg (ref 27.0–33.0)
MCHC: 32.8 g/dL (ref 32.0–36.0)
MCV: 98.3 fL (ref 80.0–100.0)
MPV: 10.6 fL (ref 7.5–12.5)
Monocytes Relative: 8.9 %
Neutro Abs: 5358 cells/uL (ref 1500–7800)
Neutrophils Relative %: 70.5 %
Platelets: 254 10*3/uL (ref 140–400)
RBC: 4.77 10*6/uL (ref 3.80–5.10)
RDW: 13.2 % (ref 11.0–15.0)
Total Lymphocyte: 19.4 %
WBC: 7.6 10*3/uL (ref 3.8–10.8)

## 2020-06-22 LAB — COMPLETE METABOLIC PANEL WITH GFR
AG Ratio: 1.6 (calc) (ref 1.0–2.5)
ALT: 35 U/L — ABNORMAL HIGH (ref 6–29)
AST: 23 U/L (ref 10–35)
Albumin: 4.2 g/dL (ref 3.6–5.1)
Alkaline phosphatase (APISO): 108 U/L (ref 37–153)
BUN: 15 mg/dL (ref 7–25)
CO2: 28 mmol/L (ref 20–32)
Calcium: 10.5 mg/dL — ABNORMAL HIGH (ref 8.6–10.4)
Chloride: 101 mmol/L (ref 98–110)
Creat: 0.66 mg/dL (ref 0.50–0.99)
GFR, Est African American: 105 mL/min/{1.73_m2} (ref >=60)
GFR, Est Non African American: 91 mL/min/{1.73_m2} (ref >=60)
Globulin: 2.6 g/dL (calc) (ref 1.9–3.7)
Glucose, Bld: 107 mg/dL — ABNORMAL HIGH (ref 65–99)
Potassium: 4.8 mmol/L (ref 3.5–5.3)
Sodium: 138 mmol/L (ref 135–146)
Total Bilirubin: 0.6 mg/dL (ref 0.2–1.2)
Total Protein: 6.8 g/dL (ref 6.1–8.1)

## 2020-06-22 LAB — LIPID PANEL
Cholesterol: 199 mg/dL (ref <200)
HDL: 59 mg/dL (ref >=50)
LDL Cholesterol (Calc): 118 mg/dL (calc) — ABNORMAL HIGH
Non-HDL Cholesterol (Calc): 140 mg/dL (calc) — ABNORMAL HIGH (ref <130)
Total CHOL/HDL Ratio: 3.4 (calc) (ref <5.0)
Triglycerides: 113 mg/dL (ref <150)

## 2020-06-22 LAB — HEMOGLOBIN A1C
Hgb A1c MFr Bld: 5.5 % of total Hgb (ref <5.7)
Mean Plasma Glucose: 111 mg/dL
eAG (mmol/L): 6.2 mmol/L

## 2020-06-23 ENCOUNTER — Encounter: Payer: Medicare HMO | Admitting: Occupational Therapy

## 2020-06-27 ENCOUNTER — Encounter: Payer: Medicare HMO | Admitting: Occupational Therapy

## 2020-06-30 ENCOUNTER — Encounter: Payer: Medicare HMO | Admitting: Occupational Therapy

## 2020-07-04 ENCOUNTER — Encounter: Payer: Medicare HMO | Admitting: Occupational Therapy

## 2020-07-07 ENCOUNTER — Encounter: Payer: Medicare HMO | Admitting: Occupational Therapy

## 2020-07-12 DIAGNOSIS — H26493 Other secondary cataract, bilateral: Secondary | ICD-10-CM | POA: Diagnosis not present

## 2020-07-12 DIAGNOSIS — H18413 Arcus senilis, bilateral: Secondary | ICD-10-CM | POA: Diagnosis not present

## 2020-07-12 DIAGNOSIS — H26491 Other secondary cataract, right eye: Secondary | ICD-10-CM | POA: Diagnosis not present

## 2020-07-12 DIAGNOSIS — H02831 Dermatochalasis of right upper eyelid: Secondary | ICD-10-CM | POA: Diagnosis not present

## 2020-07-12 DIAGNOSIS — Z961 Presence of intraocular lens: Secondary | ICD-10-CM | POA: Diagnosis not present

## 2020-07-14 ENCOUNTER — Encounter: Payer: Medicare HMO | Admitting: Occupational Therapy

## 2020-08-02 DIAGNOSIS — H26492 Other secondary cataract, left eye: Secondary | ICD-10-CM | POA: Diagnosis not present

## 2020-08-03 ENCOUNTER — Inpatient Hospital Stay: Payer: Medicare HMO

## 2020-08-11 ENCOUNTER — Ambulatory Visit: Payer: Medicare HMO

## 2020-08-17 ENCOUNTER — Other Ambulatory Visit: Payer: Self-pay | Admitting: Nurse Practitioner

## 2020-08-17 ENCOUNTER — Other Ambulatory Visit (HOSPITAL_COMMUNITY): Payer: Self-pay | Admitting: Student

## 2020-08-17 ENCOUNTER — Inpatient Hospital Stay: Payer: Medicare HMO | Attending: Obstetrics and Gynecology | Admitting: Obstetrics and Gynecology

## 2020-08-17 VITALS — BP 145/61 | HR 72 | Temp 98.3°F | Resp 18 | Wt 234.0 lb

## 2020-08-17 DIAGNOSIS — R918 Other nonspecific abnormal finding of lung field: Secondary | ICD-10-CM

## 2020-08-17 DIAGNOSIS — E785 Hyperlipidemia, unspecified: Secondary | ICD-10-CM | POA: Diagnosis not present

## 2020-08-17 DIAGNOSIS — Z952 Presence of prosthetic heart valve: Secondary | ICD-10-CM | POA: Diagnosis not present

## 2020-08-17 DIAGNOSIS — I350 Nonrheumatic aortic (valve) stenosis: Secondary | ICD-10-CM | POA: Diagnosis not present

## 2020-08-17 DIAGNOSIS — Z79899 Other long term (current) drug therapy: Secondary | ICD-10-CM | POA: Diagnosis not present

## 2020-08-17 DIAGNOSIS — Z6841 Body Mass Index (BMI) 40.0 and over, adult: Secondary | ICD-10-CM | POA: Diagnosis not present

## 2020-08-17 DIAGNOSIS — K5730 Diverticulosis of large intestine without perforation or abscess without bleeding: Secondary | ICD-10-CM | POA: Diagnosis not present

## 2020-08-17 DIAGNOSIS — K449 Diaphragmatic hernia without obstruction or gangrene: Secondary | ICD-10-CM | POA: Diagnosis not present

## 2020-08-17 DIAGNOSIS — I890 Lymphedema, not elsewhere classified: Secondary | ICD-10-CM | POA: Diagnosis not present

## 2020-08-17 DIAGNOSIS — Z7901 Long term (current) use of anticoagulants: Secondary | ICD-10-CM | POA: Diagnosis not present

## 2020-08-17 DIAGNOSIS — Z923 Personal history of irradiation: Secondary | ICD-10-CM | POA: Diagnosis not present

## 2020-08-17 DIAGNOSIS — I714 Abdominal aortic aneurysm, without rupture, unspecified: Secondary | ICD-10-CM

## 2020-08-17 DIAGNOSIS — I2510 Atherosclerotic heart disease of native coronary artery without angina pectoris: Secondary | ICD-10-CM | POA: Diagnosis not present

## 2020-08-17 DIAGNOSIS — I700 Atherosclerosis of aorta: Secondary | ICD-10-CM | POA: Insufficient documentation

## 2020-08-17 DIAGNOSIS — I77810 Thoracic aortic ectasia: Secondary | ICD-10-CM

## 2020-08-17 DIAGNOSIS — C541 Malignant neoplasm of endometrium: Secondary | ICD-10-CM

## 2020-08-17 DIAGNOSIS — I480 Paroxysmal atrial fibrillation: Secondary | ICD-10-CM | POA: Insufficient documentation

## 2020-08-17 NOTE — Progress Notes (Signed)
CT from 08/10/19 showed: Ectasia of ascending thoracic aorta (4.0 cm in diameter). Recommend annual imaging followup by CTA or MRA. This recommendation follows 2010 CCF/AHA/AATS/ACR/ASA/SCA/SCAI/SIR/STS/SVM Guidelines for the Diagnosis and Management of Patients with Thoracic Aortic Disease. Circulation. 2010; 121: Q762-U633. Aortic aneurysm NOS (ICD10-I71.9).  Discussed with patient who would like to proceed with imaging. I've reached out to Gladstone Pih, NP at [facility] cardiology to discuss.

## 2020-08-17 NOTE — Progress Notes (Signed)
[place]  Telephone:([phone] [phone]  Patient Care Team: Steele Sizer, MD as PCP - General (Family Medicine) Will Bonnet, MD as Attending Physician (Obstetrics and Gynecology) Noreene Filbert, MD as Referring Physician (Radiation Oncology) Cheree Ditto Lorra Hals, MD as Referring Physician (Cardiothoracic Surgery) Shirline Frees, MD as Referring Physician (Radiation Oncology) Clent Jacks, RN as Registered Nurse Gillis Ends, MD as Referring Physician (Obstetrics) Yolonda Kida, MD as Consulting Physician (Cardiology) Derinda Sis, MD as Referring Physician (Internal Medicine)    Name of the patient: Melody Soto  [id number]  02-19-1950   Date of visit: 08/17/2020  Gynecologic Oncology Interval Visit   Referring Provider: Will Bonnet,  MD  Chief Concern: Grade 1 endometrial cancer  Subjective:  Melody Soto is a 70 y.o. female G2P2, initially seen in consultation for Dr. 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 She is s/p prior XL ovarian cystectomy for benign disease. She returns for follow up. Follow up US completed 12/18/2018.   She presents to the clinic today for surveillance. She was last seen in our clinic 07/19/2019 and was doing well.  She saw Dr. Baruch Gouty in 06/2020 and had a negative exam. She has not yet seen Dr. Glennon Mac. She has lost 60 pounds but has noted a plateau in her weight. She is following a KETO diet with intermittent fasting.    10/27/2019 mammogram:  IMPRESSION: No mammographic evidence of malignancy.   12/10/2019 colonoscopy A. COLON POLYPS X2, CECUM; COLD SNARE:  - FRAGMENTS (X2) OF TUBULAR ADENOMAS.  - SINGLE FRAGMENT OF BENIGN COLONIC MUCOSA WITH SUPERFICIAL  SESSILE/HYPERPLASTIC CHANGES.  - FRAGMENTS (X2) OF BENIGN COLONIC MUCOSA WITH NO SIGNIFICANT  HISTOPATHOLOGIC CHANGE.  - NEGATIVE FOR HIGH-GRADE  DYSPLASIA AND MALIGNANCY.   B. COLON POLYPS X4, ASCENDING (X1) AND TRANSVERSE (X3); COLD SNARE:  - FRAGMENTS (X5) OF TUBULAR ADENOMAS.  - SINGLE FRAGMENT OF SESSILE SERRATED POLYP.  - NEGATIVE FOR HIGH-GRADE DYSPLASIA AND MALIGNANCY.   C. COLON POLYP, SIGMOID; COLD BIOPSY:  - POLYPOID FRAGMENT OF BENIGN COLONIC MUCOSA WITH UNDERLYING MATURE  ADIPOSE TISSUE, COMPATIBLE WITH SUBMUCOSAL LIPOMA.  - NEGATIVE FOR DYSPLASIA AND MALIGNANCY.   D. COLON, SIGMOID, NODULAR MUCOSA; COLD BIOPSY:  - BENIGN COLONIC MUCOSA WITH SUPERFICIAL REACTIVE CHANGES.  - NEGATIVE FOR FEATURES OF MICROSCOPIC COLITIS.  - NEGATIVE FOR DYSPLASIA AND MALIGNANCY.   08/10/2019 Chest CT IMPRESSION: 1. Multiple tiny pulmonary nodules scattered throughout the lungs bilaterally measuring 4 mm or less in size, stable compared to prior examinations, considered definitively benign. No findings to suggest metastatic disease in the thorax. 2. Aortic atherosclerosis, in addition to 2 vessel coronary artery disease. Please note that although the presence of coronary artery calcium documents the presence of coronary artery disease, the severity of this disease and any potential stenosis cannot be assessed on this non-gated CT examination. Assessment for potential risk factor modification, dietary therapy or pharmacologic therapy may be warranted, if clinically indicated. 3. Ectasia of ascending thoracic aorta (4.0 cm in diameter). Recommend annual imaging followup by CTA or MRA. This recommendation follows 2010 ACCF/AHA/AATS/ACR/ASA/SCA/SCAI/SIR/STS/SVM Guidelines for the Diagnosis and Management of Patients with Thoracic Aortic Disease. Circulation. 2010; 121: S063-K160. Aortic aneurysm NOS (ICD10-I71.9). 4. Status post TAVR.  We shared this information with Dr. Ancil Boozer who reached out to the patient via MyChart to contact her vascular surgeon. Melody Soto did not see this message.    Gynecologic Oncology History Melody Soto  is a  pleasant female G2P2 s/p prior XL ovarian cystectomy for benign disease who is seen in consultation from Dr. Glennon Mac for grade 1 endometrial cancer.  10/17/2016 Endometrial biopsy and biopsy of cervical polyp  Part A: ENDOMETRIUM, BIOPSY:  WELL DIFFERENTIATED ENDOMETRIAL ADENOCARCINOMA (FIGO I). COMMENT: THIS CASE WAS REVIEWED IN INTRADEPARTMENTAL CONSULTATION AND THE DIAGNOSIS REFLECTS OUR CONSENSUS OPINION.DR,JACKSON'S OFFICE WAS INFORMED OF DIAGNOSIS ON 10/19/16 AT 1.10 PM.  Part B: ENDOCERVIX, POLYP:  MINUTE FRAGMENTS OFOF ADENOCARCINOMA PROBABLY FROM ENDOMETRIUM.  MINUTE FRAGMENTS OF BENIGN ENDOCERVICAL GLANDS, MUCUS, AND BLOOD.  Pap unsatisfactory   10/29/2017 Pelvic ultrasound Uterus 7.3 x 5 x 5 cm with small leiomyomas Bilateral ovaries normal. Endometrial stripe 7.1 mm   CT C/A/P 11/20/2016 IMPRESSION: 1. There is a mildly enlarged right hilar lymph node at 1.1 cm, neck given the that there are no other indicators of metastatic disease, this is likely incidental. No worrisome pulmonary nodules identified. 2. The endometrium is indistinct. Uterine and adnexal contours relatively normal. 3. Asymmetric glandular tissues in the right upper breast compared to the left. Asymmetry is often normal, but mammographic correlation is suggested. 4. Other imaging findings of potential clinical significance: Aortic Atherosclerosis (ICD10-I70.0). Calcified aortic valve with faint calcification of the mitral valve. Cholelithiasis. Small type 1 hiatal hernia. Descending colon diverticulosis.  She was not a candidate for surgical management due to cardiac disease. She underwent primary radiation.   She underwent WPRT (45 Gy over 5 weeks) with Dr. Baruch [facility]outy at [facility] followed by vaginal cuff brachytherapy with [facility]. Christel [facility] at [facility]. She completed high-dose rate Brachytherapy to the cervix (treatment 5 of planned 5) on 04/02/2017.  04/16/3017 Mammogram negative   05/09/3017 CT chest wo contrast IMPRESSION: 1.  Stable small scattered mediastinal and hilar lymph nodes. No mass or adenopathy. No new or progressive findings. 2. Stable small pulmonary nodules. Recommend follow-up noncontrast chest CT in 1 year. 3. Stable mild emphysematous changes.  07/31/3017 CT Abdomen Pelvis W Contrast IMPRESSION: Negative. No evidence of recurrent or metastatic carcinoma within the abdomen or pelvis. Cholelithiasis.  No radiographic evidence of cholecystitis. Mild hepatic steatosis. Colonic diverticulosis, without radiographic evidence of diverticulitis.  03/2018 she had noticed some vaginal spotting and evaluation included CT scan, [place], and EMBX.   04/11/2018 CT C/A/P IMPRESSION: 1. Stable exam. No new or progressive finding since prior chest CT and abdomen/pelvis CT. 2. Multiple bilateral pulmonary nodules measuring 5 mm less, stable in the 1 year interval since 05/09/2017 suggesting benign etiology. Continued attention on follow-up recommended 3. Cholelithiasis. 4. Colonic diverticulosis without diverticulitis.  04/21/2018 Pelvic US Uterus: Measurements: 5.9 x 4.3 x 5.3 cm = volume: 69.5 mL. 11 x 10 x 18 mm subserosal fibroid in the anterior lower uterine segment. Endometrium Thickness: 8 mm.  No focal abnormality visualized.  Bilateral ovaries: No adnexal mass is seen.  04/23/2018   A. ENDOMETRIUM; BIOPSY:  - EXTREMELY SCANT BENIGN SQUAMOUS AND ENDOCERVICAL EPITHELIUM.  - NO ENDOMETRIAL TISSUE IS PRESENT.  We recommended repeat ultrasound and if EMS stable or increase we would repeat EMBx since the biopsy was not adequate for evaluation of the endometrial cavity.     06/17/2018 Pelvic Ultrasound FINDINGS: Uterus: Measurements: 6.3 x 3.8 x 5.0 cm = volume: 62.2 mL. 1.2 x 0.9 x 1.3 cm exophytic fibroid present at the level of the lower uterine segment.   Endometrium Thickness: 7.3 mm. No focal abnormality visualized. No increased vascularity.   Bilateral ovaries: Not visualized.  No adnexal mass.   Trace free  fluid  within the pelvis.  IMPRESSION: 1. Endometrial complex measures 7.3 mm in thickness without focalabnormality. 2. 1.3 cm fibroid arising from the lower uterine segment, stable.   [place] PEL 12/18/2018 Uterus: Measurements: 8.2 x 3.3 x 4.8 cm = volume: 67.7 mL. 1.3 x 0.9 x 1.4 cm subserosal fibroid again noted at the right lower uterine segment. Endometrium:Thickness: 5.4 mm. No focal abnormality visualized. No significantly increased vascularity. Right ovary: Not visualized.  No adnexal mass. Left ovary: Not visualized.  No adnexal mass. Other findingsNo abnormal free fluid.  IMPRESSION: 1. Endometrial complex measures 5.4 mm in thickness without focal abnormality. Continued ultrasound surveillance of treated endometrial carcinoma recommended as clinically warranted. 2. 1.4 cm fibroid at the right lower uterine segment, stable.  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   Chronic acquired lymphedema 01/08/2017   Obesity, morbid, BMI 40.0-49.9 ([place]) 11/13/2016   FIGO stage II endometrial cancer ([place]) 10/29/2016   Left ventricular hypertrophy 11/27/2014   Lymphedema of lower extremity 08/23/2014   Severe aortic stenosis 08/21/2014   Benign essential HTN 08/21/2014   Depression, major, recurrent, mild ([place]) 08/21/2014   Dyslipidemia 08/21/2014   Cardiac dysrhythmia 02/04/8249   Dysmetabolic syndrome 03/70/4888   Symptomatic menopausal or female climacteric states 08/21/2014   Female genuine stress incontinence 08/21/2014   History of shingles 08/21/2014   Personal history of skin cancer  11/24/2009    Past Medical History: Past Medical History:  Diagnosis Date   Aortic stenosis    Cataract    Depression    Dysrhythmia    Endometrial cancer determined by uterine biopsy ([place]) 10/29/2016   Rad tx's and internal brachytherapy.   Heart murmur    HX OF   Hyperlipidemia    Hypertension    CONTROLLED ON MEDS   Lymphedema    Shortness of breath dyspnea     Past Surgical History: Past Surgical History:  Procedure Laterality Date   AORTIC VALVE REPLACEMENT Bilateral 02/27/2017   CARDIAC CATHETERIZATION     CARDIOVERSION  10/2019   cataract surgery Bilateral 05/24/2014   second eye 06/07/2014   COLONOSCOPY WITH PROPOFOL N/A 09/24/2019   Procedure: COLONOSCOPY WITH PROPOFOL;  Surgeon: [name], MD;  Location: [facility] ENDOSCOPY;  Service: Gastroenterology;  Laterality: N/A;   COLONOSCOPY WITH PROPOFOL N/A 12/10/2019   Procedure: COLONOSCOPY WITH PROPOFOL;  Surgeon: [name], MD;  Location: [facility] ENDOSCOPY;  Service: Endoscopy;  Laterality: N/A;   CORONARY ARTERY BYPASS GRAFT     EYE SURGERY     MELANOMA EXCISION     OVARIAN CYST REMOVAL     EXPLORATORY LAPAROTOMY   TONSILLECTOMY      Past Gynecologic History:  Last Menstrual Period:  2016; postmenopausal Last pap: 2018   OB History:  OB History  Gravida Para Term Preterm AB Living  2 2       2   SAB IAB Ectopic Multiple Live Births               # Outcome Date GA Lbr Len/2nd Weight Sex Delivery Anes PTL Lv  2 Para  1 Para             Obstetric Comments  7 pregancies; 2 NSVDs    Family History: Family History  Problem Relation Age of Onset   Stroke Mother    Thyroid nodules Mother    Lung cancer Mother    Diabetes Sister    Hypertension Sister    Aortic aneurysm Father    Breast cancer Maternal Aunt    Bone cancer Maternal Uncle    Depression Daughter    Depression Daughter     Social History: Social History   Socioeconomic History   Marital status: Divorced     Spouse name: Not on file   Number of children: 2   Years of education: Not on file   Highest education level: Bachelor's degree (e.g., BA, AB, BS)  Occupational History   Occupation: caregiver    Employer: [employer]    Comment: caregiver  Tobacco Use   Smoking status: Never   Smokeless tobacco: Never  Vaping Use   Vaping Use: Never used  Substance and Sexual Activity   Alcohol use: Not Currently    Alcohol/week: 0.0 standard drinks   Drug use: No   Sexual activity: Not Currently    Birth control/protection: Post-menopausal  Other Topics Concern   Not on file  Social History Narrative   She is divorced   She has two daughters   The youngest is not currently talking to her since 07/2018. She got pregnant without being married and had the baby in October.    Social Determinants of Health   Financial Resource Strain: Not on file  Food Insecurity: Not on file  Transportation Needs: Not on file  Physical Activity: Not on file  Stress: Not on file  Social Connections: Not on file  Intimate Partner Violence: Not on file    Allergies: Allergies  Allergen Reactions   Ace Inhibitors Other (See Comments)    UNKNOWN REACTION   Duloxetine Other (See Comments)    Drowsiness    Current Medications: Current Outpatient Medications  Medication Sig Dispense Refill   apixaban (ELIQUIS) 5 MG TABS tablet Take by mouth.     Ascorbic Acid (VITAMIN C) 500 MG CAPS Take 1,000 mg by mouth in the morning and at bedtime.     atorvastatin (LIPITOR) 40 MG tablet Take 1 tablet (40 mg total) by mouth 3 (three) times a week. 36 tablet 0   B Complex Vitamins (B COMPLEX 1 PO) Take by mouth.     Cholecalciferol (VITAMIN D-3) 125 MCG (5000 UT) TABS Take 3 tablets by mouth daily.     dofetilide (TIKOSYN) 125 MCG capsule Take by mouth.     JARDIANCE 10 MG TABS tablet Take 10 mg by mouth daily.     Magnesium 500 MG CAPS Take 800 mg by mouth in the morning and at bedtime.       metoprolol succinate (TOPROL-XL) 50 MG 24 hr tablet Take 50 mg by mouth 2 (two) times daily.     spironolactone (ALDACTONE) 25 MG tablet Take 25 mg by mouth 2 (two) times daily.     telmisartan (MICARDIS) 40 MG tablet TAKE 1 TABLET BY MOUTH EVERY DAY 90 tablet 1   No current facility-administered medications for this visit.   Review of Systems General: no complaints  HEENT: no complaints  Lungs: no complaints  Cardiac: no complaints  GI: no complaints  GU: no complaints  Musculoskeletal: no complaints  Extremities: no complaints  Skin: no complaints  Neuro: no complaints  Endocrine: no complaints  Psych: no complaints        Objective:  Physical Examination:   BP (!) 145/61 (Patient Position: Sitting)   Pulse 72   Temp 98.3 F (36.8 C) (Oral)   Resp 18   Wt 234 lb (106.1 kg)   SpO2 98%   BMI 34.56 kg/m    ECOG Performance Status: 0  GENERAL: Patient is a well appearing female in no acute distress HEENT:  PERRL, neck supple with midline trachea. Thyroid without masses.  NODES:  No cervical, supraclavicular, axillary, or inguinal lymphadenopathy palpated.  LUNGS:  Clear to auscultation bilaterally.   HEART:  Regular rate and rhythm. ABDOMEN:  Soft, nontender, nondistended. No ascites, masses, or hernias.  MSK:  No focal spinal tenderness to palpation. Full range of motion bilaterally in the upper extremities. EXTREMITIES:  lymphedema stockings in place NEURO:  Nonfocal. Well oriented.  Appropriate affect.  Pelvic: EGBUS: no lesions Cervix: no lesions, nontender, mobile Vagina: no lesions, no discharge or bleeding Uterus: not enlarged, nontender, mobile, exam limited by habitus Adnexa: no palpable masses Rectovaginal: deferred     Assessment:  TRISTYN PHARRIS is a 70 y.o. female diagnosed with possible stage II grade 1 endometrial cancer with involvement of a cervical polyp s/p primary radiation therapy. NED on exam  Prior history of spotting - Imaging  unremarkable other than thickened EMS. EMBx obtained with scant tissue, repeat imaging decreased endometrial stripe 06/2018 and no further bleeding episodes. Repeat US decreased EMS and no further symptoms. Continue to follow.    Stable small pulmonary nodules on imaging - repeat CT chest 03/2018 stable. Follow CT chest    Enlarged hilar node - no evidence of progression on imaging.   Asymmetric breast tissue - negative mammogram, recommended she follow up with her screening mammogram which was negative.   S/P TAVR (transcatheter aortic valve replacement)  Ectasia of ascending thoracic aorta.  Hyperplastic polyp on colonoscopy.    Medical co-morbidities complicating care:  left ventricular hypertrophy, severe aortic stenosis, cardiac dysrhythmia, hyperlipidemia,, HTN and obesity.    Plan:   Endometrial cancer [facility])  Pulmonary nodules  Lymphedema of lower extremity, unspecified laterality   We will begin to alternate with Dr. Glennon Mac. We discussed that she will need to reach out to Dr. Marisue [place] office to schedule an appt to be seen in 6 months. We provided an appt for her to see [place] in 1 year.   With regard to pulmonary nodules repeat Chest CT now and that will also follow up on the ectasia. We asked her to discuss with her cardiologist at [facility].   Hyperplastic polyp on colonoscopy. She stated she was not aware of this results. She believes she is supposed to have another coloscopy in 2 years. We also asked her to follow up with Dr. Ancil Boozer about this issue and the hyperplastic polyp.   We discussed weight loss and exercise. She is following a keto diet and I advised against this diet given some of the concerning data in animal cancer models. We reviewed the option of doing chair exercises and the exercise recommendations for 5 days a week for 45 minutes.   Continue routine health care and screening.   Francessca Friis Gaetana Michaelis, MD

## 2020-08-17 NOTE — Patient Instructions (Signed)
Please contact Dr. Marisue [place] office to schedule a follow up visit in 6 months (around January 2023). We will plan to see you back in 1 year or sooner if you have concerning symptoms. Thank you for allowing [place] to participate in your care. Beckey Rutter, NP & Dr. Theora Gianotti

## 2020-08-25 ENCOUNTER — Ambulatory Visit (INDEPENDENT_AMBULATORY_CARE_PROVIDER_SITE_OTHER): Payer: Medicare HMO

## 2020-08-25 ENCOUNTER — Other Ambulatory Visit: Payer: Self-pay | Admitting: Student

## 2020-08-25 DIAGNOSIS — I714 Abdominal aortic aneurysm, without rupture, unspecified: Secondary | ICD-10-CM

## 2020-08-25 DIAGNOSIS — Z0000 Encounter for general adult medical examination without abnormal findings: Secondary | ICD-10-CM | POA: Diagnosis not present

## 2020-08-25 NOTE — Progress Notes (Signed)
Subjective:   Melody Soto is a 70 y.o. female who presents for Medicare Annual (Subsequent) preventive examination.  Virtual Visit via Telephone Note  I connected with  Melody Soto on 08/25/20 at 10:00 AM EDT by telephone and verified that I am speaking with the correct person using two identifiers.  Location: Patient: home Provider: [place] Persons participating in the virtual visit: [place]   I discussed the limitations, risks, security and privacy concerns of performing an evaluation and management service by telephone and the availability of in person appointments. The patient expressed understanding and agreed to proceed.  Interactive audio and video telecommunications were attempted between this nurse and patient, however failed, due to patient having technical difficulties OR patient did not have access to video capability.  We continued and completed visit with audio only.  Some vital signs may be absent or patient reported.   Clemetine Marker, LPN   Review of Systems     Cardiac Risk Factors include: advanced age (>40men, >64 women);obesity (BMI >30kg/m2);hypertension;dyslipidemia;sedentary lifestyle     Objective:    There were no vitals filed for this visit. There is no height or weight on file to calculate BMI.  Advanced Directives 08/25/2020 01/29/2020 01/28/2020 12/10/2019 09/24/2019 07/29/2019 05/01/2019  Does Patient Have a Medical Advance Directive? No No No No No Yes No  Type of Advance Directive - - - - - Press photographer -  Does patient want to make changes to medical advance directive? - - - - - - -  Copy of [place] in Chart? - - - - - No - copy requested -  Would patient like information on creating a medical advance directive? No - Patient declined No - Patient declined No - Patient declined - - - No - Patient declined    Current Medications (verified) Outpatient Encounter Medications as of  08/25/2020  Medication Sig   apixaban (ELIQUIS) 5 MG TABS tablet Take 5 mg by mouth 2 (two) times daily.   Ascorbic Acid (VITAMIN C) 500 MG CAPS Take 1,000 mg by mouth in the morning and at bedtime.   atorvastatin (LIPITOR) 40 MG tablet Take 1 tablet (40 mg total) by mouth 3 (three) times a week.   B Complex Vitamins (B COMPLEX 1 PO) Take by mouth.   Cholecalciferol (VITAMIN D-3) 125 MCG (5000 UT) TABS Take 3 tablets by mouth daily.   dofetilide (TIKOSYN) 125 MCG capsule Take 125 mcg by mouth 2 (two) times daily.   JARDIANCE 10 MG TABS tablet Take 10 mg by mouth daily.   Magnesium 500 MG CAPS Take 800 mg by mouth in the morning and at bedtime.    metoprolol succinate (TOPROL-XL) 50 MG 24 hr tablet Take 50 mg by mouth 2 (two) times daily.   spironolactone (ALDACTONE) 25 MG tablet Take 25 mg by mouth 2 (two) times daily.   telmisartan (MICARDIS) 40 MG tablet TAKE 1 TABLET BY MOUTH EVERY DAY   zinc gluconate 50 MG tablet Take 50 mg by mouth daily.   No facility-administered encounter medications on file as of 08/25/2020.    Allergies (verified) Ace inhibitors and Duloxetine   History: Past Medical History:  Diagnosis Date   Aortic stenosis    Cataract    Depression    Dysrhythmia    Endometrial cancer determined by uterine biopsy ([place]) 10/29/2016   Rad tx's and internal brachytherapy.   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/27/2017   CARDIAC CATHETERIZATION     CARDIAC VALVE REPLACEMENT  02/27/2017   CARDIOVERSION  10/2019   cataract surgery Bilateral 05/24/2014   second eye 06/07/2014   COLONOSCOPY WITH PROPOFOL N/A 09/24/2019   Procedure: COLONOSCOPY WITH PROPOFOL;  Surgeon: [name], MD;  Location: [facility] ENDOSCOPY;  Service: Gastroenterology;  Laterality: N/A;   COLONOSCOPY WITH PROPOFOL  N/A 12/10/2019   Procedure: COLONOSCOPY WITH PROPOFOL;  Surgeon: [name], MD;  Location: [facility] ENDOSCOPY;  Service: Endoscopy;  Laterality: N/A;   CORONARY ARTERY BYPASS GRAFT     EYE SURGERY     MELANOMA EXCISION     OVARIAN CYST REMOVAL     EXPLORATORY LAPAROTOMY   TONSILLECTOMY     Family History  Problem Relation Age of Onset   Stroke Mother    Thyroid nodules Mother    Lung cancer Mother    Cancer Mother    Hypertension Mother    Diabetes Sister    Hypertension Sister    Aortic aneurysm Father    Early death Father    Heart disease Father    Obesity Father    Stroke Father    Breast cancer Maternal Aunt    Bone cancer Maternal Uncle    Depression Daughter    Anxiety disorder Daughter    Depression Daughter    Social History   Socioeconomic History   Marital status: Divorced    Spouse name: Not on file   Number of children: 2   Years of education: Not on file   Highest education level: Bachelor's degree (e.g., BA, AB, BS)  Occupational History   Occupation: caregiver    Employer: [employer]    Comment: caregiver  Tobacco Use   Smoking status: Never   Smokeless tobacco: Never  Vaping Use   Vaping Use: Never used  Substance and Sexual Activity   Alcohol use: Not Currently   Drug use: No   Sexual activity: Not Currently    Birth control/protection: Abstinence, Post-menopausal  Other Topics Concern   Not on file  Social History Narrative   She is divorced   She has two daughters   The youngest is not currently talking to her since 07/2018. She got pregnant without being married and had the baby in October.    Social Determinants of Health   Financial Resource Strain: Medium Risk   Difficulty of Paying Living Expenses: Somewhat hard  Food Insecurity: No Food Insecurity   Worried About Charity fundraiser in the Last Year: Never true   Ran Out of Food in the Last Year: Never true  Transportation Needs: No Transportation Needs    Lack of Transportation (Medical): No   Lack of Transportation (Non-Medical): No  Physical Activity: Inactive   Days of Exercise per Week: 0 days   Minutes of Exercise per Session: 0 min  Stress: No Stress Concern Present   Feeling of Stress : Only a little  Social Connections: Moderately Integrated   Frequency of Communication with Friends and Family: More than three times a week   Frequency of Social Gatherings with Friends and Family: Three times a week   Attends Religious Services: More than 4 times per year   Active Member of Clubs or Organizations: Yes   Attends Club or  Organization Meetings: More than 4 times per year   Marital Status: Divorced    Tobacco Counseling Counseling given: Not Answered   Clinical Intake:  Pre-visit preparation completed: Yes  Pain : No/denies pain     Nutritional Risks: None Diabetes: No  How often do you need to have someone help you when you read instructions, pamphlets, or other written materials from your doctor or pharmacy?: 1 - Never    Interpreter Needed?: No  Information entered by :: Clemetine Marker LPN   Activities of Daily Living In your present state of health, do you have any difficulty performing the following activities: 08/25/2020 06/21/2020  Hearing? N N  Comment declines hearing aids -  Vision? N N  Difficulty concentrating or making decisions? N N  Walking or climbing stairs? N N  Dressing or bathing? N N  Doing errands, shopping? N N  Preparing Food and eating ? N -  Using the Toilet? N -  In the past six months, have you accidently leaked urine? Y -  Comment wears pads for protection -  Do you have problems with loss of bowel control? N -  Managing your Medications? N -  Managing your Finances? N -  Housekeeping or managing your Housekeeping? N -  Some recent data might be hidden    Patient Care Team: Steele Sizer, MD as PCP - General (Family Medicine) Will Bonnet, MD as Attending Physician  (Obstetrics and Gynecology) Noreene Filbert, MD as Referring Physician (Radiation Oncology) Yolonda Kida, MD as Consulting Physician (Cardiology) Derinda Sis, MD as Referring Physician (Internal Medicine)  Indicate any recent Medical Services you may have received from other than [facility] providers in the past year (date may be approximate).     Assessment:   This is a routine wellness examination for Melody Soto.  Hearing/Vision screen Hearing Screening - Comments:: Pt denies hearing difficulty Vision Screening - Comments:: Annual vision screenings done with Dr. Marvel Plan  Dietary issues and exercise activities discussed: Current Exercise Habits: The patient does not participate in regular exercise at present, Exercise limited by: None identified   Goals Addressed             This Visit's Progress    Increase physical activity       Recommend increasing physical activity to at least 3 days per week        Depression Screen PHQ 2/9 Scores 08/25/2020 06/21/2020 12/21/2019 12/21/2019 08/11/2019 05/06/2019 08/08/2018  PHQ - 2 Score 0 0 0 0 0 1 1  PHQ- 9 Score - 0 1 - - 2 5    Fall Risk Fall Risk  08/25/2020 06/21/2020 12/21/2019 08/11/2019 07/31/2019  Falls in the past year? 0 0 0 0 0  Number falls in past yr: 0 0 0 0 -  Injury with Fall? 0 0 0 0 -  Risk for fall due to : No Fall Risks - - Other (Comment) -  Risk for fall due to: Comment - - - lymphadema -  Follow up Falls prevention discussed - - Falls prevention discussed Falls prevention discussed    [place]:  Any stairs in or around the home? Yes  If so, are there any without handrails? No  Home free of loose throw rugs in walkways, pet beds, electrical cords, etc? Yes  Adequate lighting in your home to reduce risk of falls? Yes   ASSISTIVE DEVICES UTILIZED TO PREVENT FALLS:  Life alert? No  Use of a cane,  walker or w/c? No  Grab bars in the bathroom? No  Shower chair or bench in  shower? No  Elevated toilet seat or a handicapped toilet? No   TIMED UP AND GO:  Was the Soto performed? No . Telphonic visit.  Cognitive Function: Normal cognitive status assessed by direct observation by this Nurse Health Advisor. No abnormalities found.       6CIT Screen 08/05/2018  What Year? 0 points  What month? 0 points  What time? 0 points  Count back from 20 0 points  Months in reverse 0 points  Repeat phrase 0 points  Total Score 0    Immunizations Immunization History  Administered Date(s) Administered   H1N1 02/25/2017   Influenza, High Dose Seasonal PF 01/29/2018   Influenza,inj,Quad PF,6+ Mos 11/24/2015   Influenza-Unspecified 11/13/2010   Pneumococcal Conjugate-13 08/08/2018   Pneumococcal Polysaccharide-23 03/21/2017   Tdap 07/14/2013   Zoster, Live 11/24/2015    TDAP status: Up to date  Flu Vaccine status: Declined, Education has been provided regarding the importance of this vaccine but patient still declined. Advised may receive this vaccine at local pharmacy or Health Dept. Aware to provide a copy of the vaccination record if obtained from local pharmacy or Health Dept. Verbalized acceptance and understanding.  Pneumococcal vaccine status: Up to date  Covid-19 vaccine status: Declined, Education has been provided regarding the importance of this vaccine but patient still declined. Advised may receive this vaccine at local pharmacy or Health Dept.or vaccine clinic. Aware to provide a copy of the vaccination record if obtained from local pharmacy or Health Dept. Verbalized acceptance and understanding.  Qualifies for Shingles Vaccine? Yes   Zostavax completed Yes   Shingrix Completed?: No.    Education has been provided regarding the importance of this vaccine. Patient has been advised to call insurance company to determine out of pocket expense if they have not yet received this vaccine. Advised may also receive vaccine at local pharmacy or Health Dept.  Verbalized acceptance and understanding.  Screening Tests Health Maintenance  Topic Date Due   Zoster Vaccines- Shingrix (1 of 2) Never done   COVID-19 Vaccine (1) 09/10/2020 (Originally 11/10/1955)   INFLUENZA VACCINE  09/12/2020   MAMMOGRAM  10/22/2021   COLONOSCOPY (Pts 45-81yrs Insurance coverage will need to be confirmed)  12/10/2022   TETANUS/TDAP  07/15/2023   DEXA SCAN  Completed   Hepatitis C Screening  Completed   PNA vac Low Risk Adult  Completed   HPV VACCINES  Aged Out    Health Maintenance  Health Maintenance Due  Topic Date Due   Zoster Vaccines- Shingrix (1 of 2) Never done    Colorectal cancer screening: Type of screening: Colonoscopy. Completed 12/10/19. Repeat every 3 years  Mammogram status: Completed 10/23/19. Repeat every year  Bone Density status: Completed 08/20/17. Results reflect: Bone density results: OSTEOPENIA. Repeat every 2 years. Pt declines repeat screening at this time.   Lung Cancer Screening: (Low Dose CT Chest recommended if Age 69-80 years, 30 pack-year currently smoking OR have quit w/in 15years.) does not qualify.   Additional Screening:  Hepatitis C Screening: does qualify; Completed 06/24/17  Vision Screening: Recommended annual ophthalmology exams for early detection of glaucoma and other disorders of the eye. Is the patient up to date with their annual eye exam?  Yes  Who is the provider or what is the name of the office in which the patient attends annual eye exams? Dr. Marvel Plan.   Dental Screening: Recommended annual dental  exams for proper oral hygiene  Community Resource Referral / Chronic Care Management: CRR required this visit?  No   CCM required this visit?  No      Plan:     I have personally reviewed and noted the following in the patient's chart:   Medical and social history Use of alcohol, tobacco or illicit drugs  Current medications and supplements including opioid prescriptions.  Functional ability and  status Nutritional status Physical activity Advanced directives List of other physicians Hospitalizations, surgeries, and ER visits in previous 12 months Vitals Screenings to include cognitive, depression, and falls Referrals and appointments  In addition, I have reviewed and discussed with patient certain preventive protocols, quality metrics, and best practice recommendations. A written personalized care plan for preventive services as well as general preventive health recommendations were provided to patient.     Clemetine Marker, LPN   [id number]   Nurse Notes: none

## 2020-08-25 NOTE — Patient Instructions (Signed)
Melody Soto , Thank you for taking time to come for your Medicare Wellness Visit. I appreciate your ongoing commitment to your health goals. Please review the following plan we discussed and let me know if I can assist you in the future.   Screening recommendations/referrals: Colonoscopy: done 12/10/19; repeat in 2024 Mammogram: done 10/23/19 Bone Density: one 08/20/17 Recommended yearly ophthalmology/optometry visit for glaucoma screening and checkup Recommended yearly dental visit for hygiene and checkup  Vaccinations: Influenza vaccine: declined Pneumococcal vaccine: done 08/08/18 Tdap vaccine: done 07/14/13 Shingles vaccine: Shingrix discussed. Please contact your pharmacy for coverage information.  Covid-19:declined  Advanced directives: Please bring a copy of your health care power of attorney and living will to the office at your convenience once you have completed that paperwork.  Conditions/risks identified: Recommend increasing physical activity to at least 3 days per week   Next appointment: Follow up in one year for your annual wellness visit    Preventive Care 65 Years and Older, Female Preventive care refers to lifestyle choices and visits with your health care provider that can promote health and wellness. What does preventive care include? A yearly physical exam. This is also called an annual well check. Dental exams once or twice a year. Routine eye exams. Ask your health care provider how often you should have your eyes checked. Personal lifestyle choices, including: Daily care of your teeth and gums. Regular physical activity. Eating a healthy diet. Avoiding tobacco and drug use. Limiting alcohol use. Practicing safe sex. Taking low-dose aspirin every day. Taking vitamin and mineral supplements as recommended by your health care provider. What happens during an annual well check? The services and screenings done by your health care provider during your annual well  check will depend on your age, overall health, lifestyle risk factors, and family history of disease. Counseling  Your health care provider may ask you questions about your: Alcohol use. Tobacco use. Drug use. Emotional well-being. Home and relationship well-being. Sexual activity. Eating habits. History of falls. Memory and ability to understand (cognition). Work and work Statistician. Reproductive health. Screening  You may have the following tests or measurements: Height, weight, and BMI. Blood pressure. Lipid and cholesterol levels. These may be checked every 5 years, or more frequently if you are over 78 years old. Skin check. Lung cancer screening. You may have this screening every year starting at age 82 if you have a 30-pack-year history of smoking and currently smoke or have quit within the past 15 years. Fecal occult blood test (FOBT) of the stool. You may have this test every year starting at age 70. Flexible sigmoidoscopy or colonoscopy. You may have a sigmoidoscopy every 5 years or a colonoscopy every 10 years starting at age 51. Hepatitis C blood test. Hepatitis B blood test. Sexually transmitted disease (STD) testing. Diabetes screening. This is done by checking your blood sugar (glucose) after you have not eaten for a while (fasting). You may have this done every 1-3 years. Bone density scan. This is done to screen for osteoporosis. You may have this done starting at age 83. Mammogram. This may be done every 1-2 years. Talk to your health care provider about how often you should have regular mammograms. Talk with your health care provider about your test results, treatment options, and if necessary, the need for more tests. Vaccines  Your health care provider may recommend certain vaccines, such as: Influenza vaccine. This is recommended every year. Tetanus, diphtheria, and acellular pertussis (Tdap, Td) vaccine. You may  need a Td booster every 10 years. Zoster  vaccine. You may need this after age 34. Pneumococcal 13-valent conjugate (PCV13) vaccine. One dose is recommended after age 8. Pneumococcal polysaccharide (PPSV23) vaccine. One dose is recommended after age 2. Talk to your health care provider about which screenings and vaccines you need and how often you need them. This information is not intended to replace advice given to you by your health care provider. Make sure you discuss any questions you have with your health care provider. Document Released: 02/25/2015 Document Revised: 10/19/2015 Document Reviewed: 11/30/2014 Elsevier Interactive Patient Education  2017 [place] Prevention in the Home Falls can cause injuries. They can happen to people of all ages. There are many things you can do to make your home safe and to help prevent falls. What can I do on the outside of my home? Regularly fix the edges of walkways and driveways and fix any cracks. Remove anything that might make you trip as you walk through a door, such as a raised step or threshold. Trim any bushes or trees on the path to your home. Use bright outdoor lighting. Clear any walking paths of anything that might make someone trip, such as rocks or tools. Regularly check to see if handrails are loose or broken. Make sure that both sides of any steps have handrails. Any raised decks and porches should have guardrails on the edges. Have any leaves, snow, or ice cleared regularly. Use sand or salt on walking paths during winter. Clean up any spills in your garage right away. This includes oil or grease spills. What can I do in the bathroom? Use night lights. Install grab bars by the toilet and in the tub and shower. Do not use towel bars as grab bars. Use non-skid mats or decals in the tub or shower. If you need to sit down in the shower, use a plastic, non-slip stool. Keep the floor dry. Clean up any water that spills on the floor as soon as it happens. Remove  soap buildup in the tub or shower regularly. Attach bath mats securely with double-sided non-slip rug tape. Do not have throw rugs and other things on the floor that can make you trip. What can I do in the bedroom? Use night lights. Make sure that you have a light by your bed that is easy to reach. Do not use any sheets or blankets that are too big for your bed. They should not hang down onto the floor. Have a firm chair that has side arms. You can use this for support while you get dressed. Do not have throw rugs and other things on the floor that can make you trip. What can I do in the kitchen? Clean up any spills right away. Avoid walking on wet floors. Keep items that you use a lot in easy-to-reach places. If you need to reach something above you, use a strong step stool that has a grab bar. Keep electrical cords out of the way. Do not use floor polish or wax that makes floors slippery. If you must use wax, use non-skid floor wax. Do not have throw rugs and other things on the floor that can make you trip. What can I do with my stairs? Do not leave any items on the stairs. Make sure that there are handrails on both sides of the stairs and use them. Fix handrails that are broken or loose. Make sure that handrails are as long as the stairways.  Check any carpeting to make sure that it is firmly attached to the stairs. Fix any carpet that is loose or worn. Avoid having throw rugs at the top or bottom of the stairs. If you do have throw rugs, attach them to the floor with carpet tape. Make sure that you have a light switch at the top of the stairs and the bottom of the stairs. If you do not have them, ask someone to add them for you. What else can I do to help prevent falls? Wear shoes that: Do not have high heels. Have rubber bottoms. Are comfortable and fit you well. Are closed at the toe. Do not wear sandals. If you use a stepladder: Make sure that it is fully opened. Do not climb a  closed stepladder. Make sure that both sides of the stepladder are locked into place. Ask someone to hold it for you, if possible. Clearly mark and make sure that you can see: Any grab bars or handrails. First and last steps. Where the edge of each step is. Use tools that help you move around (mobility aids) if they are needed. These include: Canes. Walkers. Scooters. Crutches. Turn on the lights when you go into a dark area. Replace any light bulbs as soon as they burn out. Set up your furniture so you have a clear path. Avoid moving your furniture around. If any of your floors are uneven, fix them. If there are any pets around you, be aware of where they are. Review your medicines with your doctor. Some medicines can make you feel dizzy. This can increase your chance of falling. Ask your doctor what other things that you can do to help prevent falls. This information is not intended to replace advice given to you by your health care provider. Make sure you discuss any questions you have with your health care provider. Document Released: 11/25/2008 Document Revised: 07/07/2015 Document Reviewed: 03/05/2014 Elsevier Interactive Patient Education  2017 [employer].

## 2020-08-29 ENCOUNTER — Other Ambulatory Visit: Payer: Self-pay | Admitting: Family Medicine

## 2020-08-29 DIAGNOSIS — E785 Hyperlipidemia, unspecified: Secondary | ICD-10-CM

## 2020-08-30 ENCOUNTER — Other Ambulatory Visit (HOSPITAL_COMMUNITY): Payer: Self-pay | Admitting: *Deleted

## 2020-08-30 ENCOUNTER — Telehealth (HOSPITAL_COMMUNITY): Payer: Self-pay | Admitting: *Deleted

## 2020-08-30 DIAGNOSIS — Z01812 Encounter for preprocedural laboratory examination: Secondary | ICD-10-CM

## 2020-08-30 NOTE — Telephone Encounter (Signed)
Patient returning call regarding upcoming cardiac imaging study; pt verbalizes understanding of appt date/time, parking situation and where to check in, pre-test NPO status and medications ordered, and verified current allergies; name and call back number provided for further questions should they arise  Gordy Clement RN Navigator Cardiac Imaging [facility] [phone] office [phone] cell  Patient to take her daily tikosyn and will take 100mg  metoprolol tartrate 2 hours prior to cardiac CT.  She states she may have an metoprolol tartrate at home and will contact us if she needs a prescription. Patient will hold her daily 50mg  metoprolol succinate and is aware to get blood work prior to cardiac CT scan.

## 2020-08-30 NOTE — Telephone Encounter (Signed)
Attempted to call patient regarding upcoming cardiac CT appointment. °Left message on voicemail with name and callback number ° °Talon Regala RN Navigator Cardiac Imaging ° [facility] °[phone] Office °[phone] Cell ° °

## 2020-08-31 DIAGNOSIS — Z01812 Encounter for preprocedural laboratory examination: Secondary | ICD-10-CM | POA: Diagnosis not present

## 2020-09-01 ENCOUNTER — Other Ambulatory Visit: Payer: Self-pay

## 2020-09-01 ENCOUNTER — Ambulatory Visit
Admission: RE | Admit: 2020-09-01 | Discharge: 2020-09-01 | Disposition: A | Discharge status: Home / Self Care | Payer: Medicare HMO | Source: Ambulatory Visit | Attending: Nurse Practitioner | Admitting: Nurse Practitioner

## 2020-09-01 ENCOUNTER — Ambulatory Visit: Payer: Medicare HMO

## 2020-09-01 DIAGNOSIS — I714 Abdominal aortic aneurysm, without rupture, unspecified: Secondary | ICD-10-CM

## 2020-09-01 DIAGNOSIS — I2510 Atherosclerotic heart disease of native coronary artery without angina pectoris: Secondary | ICD-10-CM | POA: Diagnosis present

## 2020-09-01 LAB — BASIC METABOLIC PANEL
BUN/Creatinine Ratio: 22 (ref 12–28)
BUN: 17 mg/dL (ref 8–27)
CO2: 24 mmol/L (ref 20–29)
Calcium: 10.4 mg/dL — ABNORMAL HIGH (ref 8.7–10.3)
Chloride: 101 mmol/L (ref 96–106)
Creatinine, Ser: 0.77 mg/dL (ref 0.57–1.00)
Glucose: 111 mg/dL — ABNORMAL HIGH (ref 65–99)
Potassium: 5.2 mmol/L (ref 3.5–5.2)
Sodium: 140 mmol/L (ref 134–144)
eGFR: 84 mL/min/{1.73_m2} (ref >59)

## 2020-09-01 MED ORDER — METOPROLOL TARTRATE 5 MG/5ML IV SOLN
10.0000 mg | Freq: Once | INTRAVENOUS | Status: AC
  Administered 2020-09-01: 10 mg via INTRAVENOUS

## 2020-09-01 MED ORDER — NITROGLYCERIN 0.4 MG SL SUBL
0.8000 mg | SUBLINGUAL_TABLET | Freq: Once | SUBLINGUAL | Status: AC
  Administered 2020-09-01: 0.8 mg via SUBLINGUAL

## 2020-09-01 MED ORDER — IOHEXOL 350 MG/ML SOLN
150.0000 mL | Freq: Once | INTRAVENOUS | Status: AC | PRN
  Administered 2020-09-01: 150 mL via INTRAVENOUS

## 2020-09-01 NOTE — Progress Notes (Signed)
Patient tolerated procedure well. Ambulate w/o difficulty. Sitting in chair drinking water provided. Encouraged to drink extra water today and reasoning explained. Verbalized understanding. All questions answered. ABC intact. No further needs. Discharge from procedure area w/o issues.  

## 2020-09-02 ENCOUNTER — Inpatient Hospital Stay: Admission: RE | Admit: 2020-09-02 | Payer: Medicare HMO | Source: Ambulatory Visit

## 2020-09-02 DIAGNOSIS — I82B21 Chronic embolism and thrombosis of right subclavian vein: Secondary | ICD-10-CM | POA: Diagnosis not present

## 2020-09-02 DIAGNOSIS — I2510 Atherosclerotic heart disease of native coronary artery without angina pectoris: Secondary | ICD-10-CM | POA: Diagnosis not present

## 2020-09-02 DIAGNOSIS — I700 Atherosclerosis of aorta: Secondary | ICD-10-CM | POA: Diagnosis not present

## 2020-09-02 DIAGNOSIS — K449 Diaphragmatic hernia without obstruction or gangrene: Secondary | ICD-10-CM | POA: Diagnosis not present

## 2020-09-02 DIAGNOSIS — K8020 Calculus of gallbladder without cholecystitis without obstruction: Secondary | ICD-10-CM | POA: Diagnosis not present

## 2020-09-29 DIAGNOSIS — I890 Lymphedema, not elsewhere classified: Secondary | ICD-10-CM | POA: Diagnosis not present

## 2020-09-29 DIAGNOSIS — I519 Heart disease, unspecified: Secondary | ICD-10-CM | POA: Diagnosis not present

## 2020-09-29 DIAGNOSIS — Z952 Presence of prosthetic heart valve: Secondary | ICD-10-CM | POA: Diagnosis not present

## 2020-09-29 DIAGNOSIS — I2510 Atherosclerotic heart disease of native coronary artery without angina pectoris: Secondary | ICD-10-CM | POA: Diagnosis not present

## 2020-09-29 DIAGNOSIS — I10 Essential (primary) hypertension: Secondary | ICD-10-CM | POA: Diagnosis not present

## 2020-09-29 DIAGNOSIS — E782 Mixed hyperlipidemia: Secondary | ICD-10-CM | POA: Diagnosis not present

## 2020-09-29 DIAGNOSIS — E669 Obesity, unspecified: Secondary | ICD-10-CM | POA: Diagnosis not present

## 2020-09-29 DIAGNOSIS — I480 Paroxysmal atrial fibrillation: Secondary | ICD-10-CM | POA: Diagnosis not present

## 2020-09-29 DIAGNOSIS — I771 Stricture of artery: Secondary | ICD-10-CM | POA: Diagnosis not present

## 2020-10-03 ENCOUNTER — Ambulatory Visit: Payer: Medicare HMO | Admitting: Occupational Therapy

## 2020-10-04 ENCOUNTER — Other Ambulatory Visit: Payer: Self-pay

## 2020-10-04 ENCOUNTER — Ambulatory Visit (INDEPENDENT_AMBULATORY_CARE_PROVIDER_SITE_OTHER): Payer: Medicare HMO | Admitting: Vascular Surgery

## 2020-10-04 VITALS — BP 128/79 | HR 82 | Ht 68.0 in | Wt 229.0 lb

## 2020-10-04 DIAGNOSIS — E785 Hyperlipidemia, unspecified: Secondary | ICD-10-CM | POA: Diagnosis not present

## 2020-10-04 DIAGNOSIS — I82B11 Acute embolism and thrombosis of right subclavian vein: Secondary | ICD-10-CM

## 2020-10-04 DIAGNOSIS — I890 Lymphedema, not elsewhere classified: Secondary | ICD-10-CM

## 2020-10-04 DIAGNOSIS — I10 Essential (primary) hypertension: Secondary | ICD-10-CM

## 2020-10-04 NOTE — Progress Notes (Signed)
Patient ID: Melody Soto, female   DOB: [birth year], 70 y.o.   MRN: [id number]  Chief Complaint  Patient presents with   New Patient (Initial Visit)    Consult studies done 09/01/20 in epic non rheumatic aortic ( Valve) stenosis referred by Soto Melody Soto     HPI Melody Soto is a 70 y.o. female.  I am asked to see the patient by Melody Soto for evaluation of right subclavian vein occlusion.  Patient underwent a recent CT scan which I have independently reviewed which does clearly demonstrate either a very high-grade stenosis or an occlusion (which is more likely) of the right subclavian vein with extensive collaterals present.  The right innominate and superior vena cava appear to be patent.  The patient reports no significant arm swelling or arm pain in either upper extremity.  She does have very significant lower extremity swelling with what appears to be significant lymphedema in the legs.  This is chronic and has been present for many years.  No history of DVT or superficial thrombophlebitis to her knowledge.   Past Medical History:  Diagnosis Date   Aortic stenosis    Cataract    Depression    Dysrhythmia    Endometrial cancer determined by uterine biopsy ([place]) 10/29/2016   Rad tx's and internal brachytherapy.   Heart murmur    HX OF   Hyperlipidemia    Hypertension    CONTROLLED ON MEDS   Lymphedema    Shortness of breath dyspnea     Past Surgical History:  Procedure Laterality Date   AORTIC VALVE REPLACEMENT Bilateral 02/27/2017   CARDIAC CATHETERIZATION     CARDIAC VALVE REPLACEMENT  02/27/2017   CARDIOVERSION  10/2019   cataract surgery Bilateral 05/24/2014   second eye 06/07/2014   COLONOSCOPY WITH PROPOFOL N/A 09/24/2019   Procedure: COLONOSCOPY WITH PROPOFOL;  Surgeon: [name], MD;  Location: [facility] ENDOSCOPY;  Service: Gastroenterology;  Laterality: N/A;   COLONOSCOPY WITH PROPOFOL N/A 12/10/2019   Procedure: COLONOSCOPY WITH PROPOFOL;   Surgeon: [name], MD;  Location: [facility] ENDOSCOPY;  Service: Endoscopy;  Laterality: N/A;   CORONARY ARTERY BYPASS GRAFT     EYE SURGERY     MELANOMA EXCISION     OVARIAN CYST REMOVAL     EXPLORATORY LAPAROTOMY   TONSILLECTOMY       Family History  Problem Relation Age of Onset   Stroke Mother    Thyroid nodules Mother    Lung cancer Mother    Cancer Mother    Hypertension Mother    Diabetes Sister    Hypertension Sister    Aortic aneurysm Father    Early death Father    Heart disease Father    Obesity Father    Stroke Father    Breast cancer Maternal Aunt    Bone cancer Maternal Uncle    Depression Daughter    Anxiety disorder Daughter    Depression Daughter       Social History   Tobacco Use   Smoking status: Never   Smokeless tobacco: Never  Vaping Use   Vaping Use: Never used  Substance Use Topics   Alcohol use: Not Currently   Drug use: No     Allergies  Allergen Reactions   Ace Inhibitors Other (See Comments)    UNKNOWN REACTION   Duloxetine Other (See Comments)    Drowsiness    Current Outpatient Medications  Medication Sig Dispense Refill   apixaban (ELIQUIS) 5  MG TABS tablet Take 5 mg by mouth 2 (two) times daily.     Ascorbic Acid (VITAMIN C) 500 MG CAPS Take 1,000 mg by mouth in the morning and at bedtime.     atorvastatin (LIPITOR) 40 MG tablet TAKE 1 TABLET (40 MG TOTAL) BY MOUTH 3 (THREE) TIMES A WEEK. 36 tablet 0   B Complex Vitamins (B COMPLEX 1 PO) Take by mouth.     Cholecalciferol (VITAMIN D-3) 125 MCG (5000 UT) TABS Take 3 tablets by mouth daily.     dofetilide (TIKOSYN) 125 MCG capsule Take 125 mcg by mouth 2 (two) times daily.     JARDIANCE 10 MG TABS tablet Take 10 mg by mouth daily.     Magnesium 500 MG CAPS Take 800 mg by mouth in the morning and at bedtime.      metoprolol succinate (TOPROL-XL) 50 MG 24 hr tablet Take 50 mg by mouth 2 (two) times daily.     spironolactone (ALDACTONE) 25 MG tablet Take 25 mg by mouth  2 (two) times daily.     telmisartan (MICARDIS) 40 MG tablet TAKE 1 TABLET BY MOUTH EVERY DAY 90 tablet 1   zinc gluconate 50 MG tablet Take 50 mg by mouth daily.     No current facility-administered medications for this visit.      REVIEW OF SYSTEMS (Negative unless checked)  Constitutional: [] Weight loss  [] Fever  [] Chills Cardiac: [] Chest pain   [] Chest pressure   [x] Palpitations   [] Shortness of breath when laying flat   [] Shortness of breath at rest   [x] Shortness of breath with exertion. Vascular:  [] Pain in legs with walking   [] Pain in legs at rest   [] Pain in legs when laying flat   [] Claudication   [] Pain in feet when walking  [] Pain in feet at rest  [] Pain in feet when laying flat   [] History of DVT   [] Phlebitis   [x] Swelling in legs   [] Varicose veins   [] Non-healing ulcers Pulmonary:   [] Uses home oxygen   [] Productive cough   [] Hemoptysis   [] Wheeze  [] COPD   [] Asthma Neurologic:  [] Dizziness  [] Blackouts   [] Seizures   [] History of stroke   [] History of TIA  [] Aphasia   [] Temporary blindness   [] Dysphagia   [] Weakness or numbness in arms   [] Weakness or numbness in legs Musculoskeletal:  [x] Arthritis   [] Joint swelling   [] Joint pain   [] Low back pain Hematologic:  [] Easy bruising  [] Easy bleeding   [] Hypercoagulable state   [] Anemic  [] Hepatitis Gastrointestinal:  [] Blood in stool   [] Vomiting blood  [x] Gastroesophageal reflux/heartburn   [] Abdominal pain Genitourinary:  [] Chronic kidney disease   [] Difficult urination  [] Frequent urination  [] Burning with urination   [] Hematuria Skin:  [] Rashes   [] Ulcers   [] Wounds Psychological:  [] History of anxiety   [x]  History of major depression.    Physical Exam BP 128/79   Pulse 82   Ht 5' 8"  (1.727 m)   Wt 229 lb (103.9 kg)   BMI 34.82 kg/m  Gen:  WD/WN, NAD Head: [place]/AT, No temporalis wasting.  Ear/Nose/Throat: Hearing grossly intact, nares w/o erythema or drainage, oropharynx w/o Erythema/Exudate Eyes: Conjunctiva  clear, sclera non-icteric  Neck: trachea midline.  No JVD.  Pulmonary:  Good air movement, respirations not labored, no use of accessory muscles  Cardiac: Irregular Vascular:  Vessel Right Left  Radial Palpable Palpable  Gastrointestinal:. No masses, surgical incisions, or scars. Musculoskeletal: M/S 5/5 throughout.  Extremities without ischemic changes.  No deformity or atrophy.  No significant upper extremity edema.  2-3+ bilateral lower extremity edema. Neurologic: Sensation grossly intact in extremities.  Symmetrical.  Speech is fluent. Motor exam as listed above. Psychiatric: Judgment intact, Mood & affect appropriate for pt's clinical situation. Dermatologic: No rashes or ulcers noted.  No cellulitis or open wounds.    Radiology No results found.  Labs Recent Results (from the past 2160 hour(s))  Basic Metabolic Panel (BMET)     Status: Abnormal   Collection Time: 08/31/20  1:04 PM  Result Value Ref Range   Glucose 111 (H) 65 - 99 mg/dL   BUN 17 8 - 27 mg/dL   Creatinine, Ser 0.77 0.57 - 1.00 mg/dL   eGFR 84 >59 mL/min/1.73   BUN/Creatinine Ratio 22 12 - 28   Sodium 140 134 - 144 mmol/L   Potassium 5.2 3.5 - 5.2 mmol/L   Chloride 101 96 - 106 mmol/L   CO2 24 20 - 29 mmol/L   Calcium 10.4 (H) 8.7 - 10.3 mg/dL    Assessment/Plan:  Subclavian vein occlusion, right (HCC) Patient underwent a recent CT scan which I have independently reviewed which does clearly demonstrate either a very high-grade stenosis or an occlusion (which is more likely) of the right subclavian vein with extensive collaterals present.  The right innominate and superior vena cava appear to be patent.  Symptomatically, she really does not have any appreciable symptoms.  No significant arm pain or swelling.  This did not appear to be thrombotic in nature.  Given the extensive collaterals, it is likely somewhat chronic. I have offered her either monitoring this and  planning on seeing her back in 6 months with a duplex versus a venogram for further evaluation and potential treatment.  Given her paucity of symptoms, treatment is not absolutely necessary.  The patient would prefer to monitor this and I will plan to see her back in 6 months with duplex.  Benign essential HTN blood pressure control important in reducing the progression of atherosclerotic disease. On appropriate oral medications.   Lymphedema of lower extremity The patient has fairly severe lymphedema of the lower extremities with chronic swelling.  Compression, elevation, and activity have kept her symptoms under reasonable control.  We discussed that if she has worsening there are other options including a lymphedema pump which could be of benefit.  Dyslipidemia lipid control important in reducing the progression of atherosclerotic disease. Continue statin therapy      Leotis Pain 10/04/2020, 1:37 PM   This note was created with Dragon medical transcription system.  Any errors from dictation are unintentional.

## 2020-10-04 NOTE — Assessment & Plan Note (Signed)
lipid control important in reducing the progression of atherosclerotic disease. Continue statin therapy  

## 2020-10-04 NOTE — Assessment & Plan Note (Signed)
The patient has fairly severe lymphedema of the lower extremities with chronic swelling.  Compression, elevation, and activity have kept her symptoms under reasonable control.  We discussed that if she has worsening there are other options including a lymphedema pump which could be of benefit.

## 2020-10-04 NOTE — Assessment & Plan Note (Signed)
blood pressure control important in reducing the progression of atherosclerotic disease. On appropriate oral medications.  

## 2020-10-04 NOTE — Assessment & Plan Note (Signed)
Patient underwent a recent CT scan which I have independently reviewed which does clearly demonstrate either a very high-grade stenosis or an occlusion (which is more likely) of the right subclavian vein with extensive collaterals present.  The right innominate and superior vena cava appear to be patent.  Symptomatically, she really does not have any appreciable symptoms.  No significant arm pain or swelling.  This did not appear to be thrombotic in nature.  Given the extensive collaterals, it is likely somewhat chronic. I have offered her either monitoring this and planning on seeing her back in 6 months with a duplex versus a venogram for further evaluation and potential treatment.  Given her paucity of symptoms, treatment is not absolutely necessary.  The patient would prefer to monitor this and I will plan to see her back in 6 months with duplex.

## 2020-10-09 ENCOUNTER — Other Ambulatory Visit: Payer: Self-pay | Admitting: Family Medicine

## 2020-10-09 DIAGNOSIS — I10 Essential (primary) hypertension: Secondary | ICD-10-CM

## 2020-10-09 NOTE — Telephone Encounter (Signed)
Requested Prescriptions  Pending Prescriptions Disp Refills  . telmisartan (MICARDIS) 40 MG tablet [Pharmacy Med Name: TELMISARTAN 40 MG TABLET] 90 tablet 0    Sig: TAKE 1 TABLET BY MOUTH EVERY DAY     Cardiovascular:  Angiotensin Receptor Blockers Passed - 10/09/2020  1:17 PM      Passed - Cr in normal range and within 180 days    Creat  Date Value Ref Range Status  06/21/2020 0.66 0.50 - 0.99 mg/dL Final    Comment:    For patients >69 years of age, the reference limit for Creatinine is approximately 13% higher for people identified as African-American. .    Creatinine, Ser  Date Value Ref Range Status  08/31/2020 0.77 0.57 - 1.00 mg/dL Final         Passed - K in normal range and within 180 days    Potassium  Date Value Ref Range Status  08/31/2020 5.2 3.5 - 5.2 mmol/L Final         Passed - Patient is not pregnant      Passed - Last BP in normal range    BP Readings from Last 1 Encounters:  10/04/20 128/79         Passed - Valid encounter within last 6 months    Recent Outpatient Visits          3 months ago Benign essential HTN   [facility], Drue Stager, MD   9 months ago Paroxysmal atrial fibrillation [facility])   [facility] Steele Sizer, MD   1 year ago Obesity, morbid, BMI 40.0-49.9 [facility])   [facility] Steele Sizer, MD   2 years ago Mild major depression [facility])   [facility] Steele Sizer, MD   2 years ago Major depression in remission [facility])   [facility] Steele Sizer, MD      Future Appointments            In 2 months Ancil Boozer, Drue Stager, MD [facility], [place]   In 11 months  [facility], [facility]

## 2020-11-11 ENCOUNTER — Ambulatory Visit: Payer: Medicare HMO | Admitting: Occupational Therapy

## 2020-11-19 ENCOUNTER — Other Ambulatory Visit: Payer: Self-pay | Admitting: Family Medicine

## 2020-11-19 DIAGNOSIS — E785 Hyperlipidemia, unspecified: Secondary | ICD-10-CM

## 2020-11-19 NOTE — Telephone Encounter (Signed)
Requested Prescriptions  Pending Prescriptions Disp Refills  . atorvastatin (LIPITOR) 40 MG tablet [Pharmacy Med Name: ATORVASTATIN 40 MG TABLET] 12 tablet 0    Sig: TAKE 1 TABLET (40 MG TOTAL) BY MOUTH 3 (THREE) TIMES A WEEK.     Cardiovascular:  Antilipid - Statins Failed - 11/19/2020  9:17 AM      Failed - LDL in normal range and within 360 days    LDL Cholesterol (Calc)  Date Value Ref Range Status  06/21/2020 118 (H) mg/dL (calc) Final    Comment:    Reference range: <100 . Desirable range <100 mg/dL for primary prevention;   <70 mg/dL for patients with CHD or diabetic patients  with > or = 2 CHD risk factors. Marland Kitchen LDL-C is now calculated using the Martin-Hopkins  calculation, which is a validated novel method providing  better accuracy than the Friedewald equation in the  estimation of LDL-C.  Cresenciano Genre et al. Annamaria Helling. 5329;924(26): 2061-2068  (http://education.[facility].com/faq/FAQ164)          Passed - Total Cholesterol in normal range and within 360 days    Cholesterol, Total  Date Value Ref Range Status  05/17/2015 203 (H) 100 - 199 mg/dL Final   Cholesterol  Date Value Ref Range Status  06/21/2020 199 <200 mg/dL Final         Passed - HDL in normal range and within 360 days    HDL  Date Value Ref Range Status  06/21/2020 59 > OR = 50 mg/dL Final  05/17/2015 47 >39 mg/dL Final         Passed - Triglycerides in normal range and within 360 days    Triglycerides  Date Value Ref Range Status  06/21/2020 113 <150 mg/dL Final         Passed - Patient is not pregnant      Passed - Valid encounter within last 12 months    Recent Outpatient Visits          5 months ago Benign essential HTN   [facility] Steele Sizer, MD   11 months ago Paroxysmal atrial fibrillation [facility])   [facility] Steele Sizer, MD   1 year ago Obesity, morbid, BMI 40.0-49.9 [facility])   [facility] Steele Sizer, MD   2  years ago Mild major depression [facility])   [facility] Steele Sizer, MD   2 years ago Major depression in remission [facility])   [facility] Steele Sizer, MD      Future Appointments            In 1 month Ancil Boozer, Drue Stager, MD [facility], [place]   In 9 months  [facility], [facility]

## 2020-12-18 ENCOUNTER — Other Ambulatory Visit: Payer: Self-pay | Admitting: Family Medicine

## 2020-12-18 DIAGNOSIS — E785 Hyperlipidemia, unspecified: Secondary | ICD-10-CM

## 2020-12-18 NOTE — Telephone Encounter (Signed)
Courtesy refill has already been given. 6 days until appt. Refill with 2 ?  Please advise. Requested Prescriptions  Pending Prescriptions Disp Refills   atorvastatin (LIPITOR) 40 MG tablet [Pharmacy Med Name: ATORVASTATIN 40 MG TABLET] 12 tablet 0    Sig: TAKE 1 TABLET (40 MG TOTAL) BY MOUTH 3 (THREE) TIMES A WEEK.     Cardiovascular:  Antilipid - Statins Failed - 12/18/2020 11:31 AM      Failed - LDL in normal range and within 360 days    LDL Cholesterol (Calc)  Date Value Ref Range Status  06/21/2020 118 (H) mg/dL (calc) Final    Comment:    Reference range: <100 . Desirable range <100 mg/dL for primary prevention;   <70 mg/dL for patients with CHD or diabetic patients  with > or = 2 CHD risk factors. Marland Kitchen LDL-C is now calculated using the Martin-Hopkins  calculation, which is a validated novel method providing  better accuracy than the Friedewald equation in the  estimation of LDL-C.  Cresenciano Genre et al. Annamaria Helling. 3009;233(00): 2061-2068  (http://education.[facility].com/faq/FAQ164)           Passed - Total Cholesterol in normal range and within 360 days    Cholesterol, Total  Date Value Ref Range Status  05/17/2015 203 (H) 100 - 199 mg/dL Final   Cholesterol  Date Value Ref Range Status  06/21/2020 199 <200 mg/dL Final          Passed - HDL in normal range and within 360 days    HDL  Date Value Ref Range Status  06/21/2020 59 > OR = 50 mg/dL Final  05/17/2015 47 >39 mg/dL Final          Passed - Triglycerides in normal range and within 360 days    Triglycerides  Date Value Ref Range Status  06/21/2020 113 <150 mg/dL Final          Passed - Patient is not pregnant      Passed - Valid encounter within last 12 months    Recent Outpatient Visits           6 months ago Benign essential HTN   [facility] Steele Sizer, MD   12 months ago Paroxysmal atrial fibrillation [facility])   [facility] Steele Sizer, MD   1 year  ago Obesity, morbid, BMI 40.0-49.9 [facility])   [facility] Steele Sizer, MD   2 years ago Mild major depression [facility])   [facility] Steele Sizer, MD   2 years ago Major depression in remission [facility])   [facility] Steele Sizer, MD       Future Appointments             In 5 days Steele Sizer, MD [facility], [place]   In 8 months  [facility], [facility]

## 2020-12-22 NOTE — Progress Notes (Signed)
Name: Melody Soto   MRN: [id number]    DOB: 06-Feb-1951   Date:12/23/2020       Progress Note  Subjective  Chief Complaint  Follow Up  HPI  History of endometrial cancer:  seeing Dr. Donella Stade yearly now, last visit 05/2020 and last visit with surgical oncologist Dr. Alto Denver 07/2020 . She denies any vaginal bleeding or pelvic pain.    HTN:she is taking Micardis, Toprol XL and denies side effects. No chest pain or palpitation    Morbid obesity: she went on Keto diet since she saw her A1C was 6.1 % back in March 2021 , she is on intermittent fasting also. Weight down from 295 lbs March 2021 to 236 lbs today in our office . Lost another 14 lbs  over the past 6 months. She states she is very compliant with her diet. Today weight is down to 902 lbs    Metabolic syndrome: last hgb A1C was up to 6.1 % but last visit it was down to 5.5 % with Keto diet   Denies polyphagia, polyuria or polydipsia. She has been losing weigh ton low carb diet . She is also on Jardiance for CHF and helps with glucose    Lymphedema: she responded well to PT ,she has been wearing compression stocking hoses, she only wears pumps occasionally . She has also lost a lot of weight.   Subclavian Vein Occlusion:/ Right  incidental finding on CT  and is now seeing vascular surgeon and was given reassurance    S/p TAVR: she had surgery done at [facility] on 02/27/2017 for severe aortic stenosis. She is on Toprol XL  No palpitation, chest pain or orthopnea. She also has a history of CHF , medically treated now and doing well   Last Echo 09/2019 at [facility] ---------------------------------------------------------------    [place] (See above)    MILD RV SYSTOLIC DYSFUNCTION (See above)    VALVULAR REGURGITATION: MILD AR, MILD MR, TRIVIAL PR, MILD TR    DILATED ASCENDING AORTA 3.8 cm.    S/P TAVR WITH MILD-MODERATE PARAVALVULAR AORTIC REGURGITATION, UNCHANGED    FROM PRIOR.     Echo was 12/2018   INTERPRETATION ---------------------------------------------------------------   NORMAL LEFT VENTRICULAR SYSTOLIC FUNCTION WITH MILD LVH   NORMAL RIGHT VENTRICULAR SYSTOLIC FUNCTION   VALVULAR REGURGITATION: MILD AR, TRIVIAL MR, TRIVIAL PR   S/P TAVR WITH MILD-TO-MODERATE PARAVALVULAR REGURGITATION   ASCENDING AO FLOW TURBULENCE ( PEAK VEL. 2.7 M/S)  Transesophageal echo done at [facility] 11/09/2019  CONCLUSIONS ------------------------------------------------------------------    1. No LAA thrombus    2. Moderate LV dysfunction    3. Well-seated TAVR, good mobility. Trivial paravalvular AR    4. Mild MR      Major Depression: she took Celexa many years ago She is still unable to see her grand-daughter. She has not been able to see her daughter in the past 2.5 years  . She is coping by taking care of herself and has a good circle of friends    Urinary incontinence: she was seen by Urologist , tried medication but did not improve symptoms, even with weight loss she still needs to wear a pad, she is not interested on PT referral at this time   Atrial fibrillation: found during colonoscopy this Summer, procedure was scheuduled for 09/09/2019 but was cancelled due to Afib. She went to [facility], had multiple procedures done and cardioversion . She is now on Toprol , Spirolactone, [place] and Tikosyn since the Summer,  rhythm has been controlled. Also on Eliquis instead of coumadin. She denies sob, palpitation , denies chest pain . They tried cardioversion twice but only stayed on heart rhythm for less than two weeks, she is on Eliquis and no side effects of medication  Aorta dilation and Aorta Atherosclerosis:  she is on statin therapy only twice a week and eliquis   Patient Active Problem List   Diagnosis Date Noted   Subclavian vein occlusion, right ([place]) 10/04/2020   Cecal polyp    Polyp of sigmoid colon    Paroxysmal atrial fibrillation ([place]) 10/02/2019   Thoracic aortic ectasia ([place])  08/31/2019   Monitoring for anticoagulant use 01/06/2019   Diverticulosis of colon 08/08/2018   Cholelithiasis 08/08/2018   Endometrial cancer ([place]) 04/10/2017   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   Chronic acquired lymphedema 01/08/2017   FIGO stage II endometrial cancer ([place]) 10/29/2016   Left ventricular hypertrophy 11/27/2014   Lymphedema of lower extremity 08/23/2014   Severe aortic stenosis 08/21/2014   Benign essential HTN 08/21/2014   Dyslipidemia 08/21/2014   Cardiac dysrhythmia 20/94/7096   Dysmetabolic syndrome 28/36/6294   Female genuine stress incontinence 08/21/2014   History of shingles 08/21/2014   Personal history of skin cancer 11/24/2009    Past Surgical History:  Procedure Laterality Date   AORTIC VALVE REPLACEMENT Bilateral 02/27/2017   CARDIAC CATHETERIZATION     CARDIAC VALVE REPLACEMENT  02/27/2017   CARDIOVERSION  10/2019   cataract surgery Bilateral 05/24/2014   second eye 06/07/2014   COLONOSCOPY WITH PROPOFOL N/A 09/24/2019   Procedure: COLONOSCOPY WITH PROPOFOL;  Surgeon: [name], MD;  Location: [facility] ENDOSCOPY;  Service: Gastroenterology;  Laterality: N/A;   COLONOSCOPY WITH PROPOFOL N/A 12/10/2019   Procedure: COLONOSCOPY WITH PROPOFOL;  Surgeon: [name], MD;  Location: [facility] ENDOSCOPY;  Service: Endoscopy;  Laterality: N/A;   CORONARY ARTERY BYPASS GRAFT     EYE SURGERY     MELANOMA EXCISION     OVARIAN CYST REMOVAL     EXPLORATORY LAPAROTOMY   TONSILLECTOMY      Family History  Problem Relation Age of Onset   Stroke Mother    Thyroid nodules Mother    Lung cancer Mother    Cancer Mother    Hypertension Mother    Diabetes Sister    Hypertension Sister    Aortic aneurysm Father    Early death Father    Heart disease Father    Obesity Father    Stroke Father    Breast cancer Maternal Aunt    Bone cancer Maternal Uncle    Depression Daughter    Anxiety disorder Daughter     Depression Daughter     Social History   Tobacco Use   Smoking status: Never   Smokeless tobacco: Never  Substance Use Topics   Alcohol use: Not Currently     Current Outpatient Medications:    apixaban (ELIQUIS) 5 MG TABS tablet, Take 5 mg by mouth 2 (two) times daily., Disp: , Rfl:    Ascorbic Acid (VITAMIN C) 500 MG CAPS, Take 1,000 mg by mouth in the morning and at bedtime., Disp: , Rfl:    atorvastatin (LIPITOR) 40 MG tablet, TAKE 1 TABLET (40 MG TOTAL) BY MOUTH 3 (THREE) TIMES A WEEK., Disp: 6 tablet, Rfl: 0   B Complex Vitamins (B COMPLEX 1 PO), Take by mouth., Disp: , Rfl:    Cholecalciferol (VITAMIN D-3) 125 MCG (5000 UT) TABS, Take 3 tablets by mouth  daily., Disp: , Rfl:    dofetilide (TIKOSYN) 125 MCG capsule, Take 125 mcg by mouth 2 (two) times daily., Disp: , Rfl:    JARDIANCE 10 MG TABS tablet, Take 10 mg by mouth daily., Disp: , Rfl:    Magnesium 500 MG CAPS, Take 800 mg by mouth in the morning and at bedtime. , Disp: , Rfl:    metoprolol succinate (TOPROL-XL) 50 MG 24 hr tablet, Take 50 mg by mouth 2 (two) times daily., Disp: , Rfl:    spironolactone (ALDACTONE) 25 MG tablet, Take 25 mg by mouth 2 (two) times daily., Disp: , Rfl:    telmisartan (MICARDIS) 40 MG tablet, TAKE 1 TABLET BY MOUTH EVERY DAY, Disp: 90 tablet, Rfl: 0   zinc gluconate 50 MG tablet, Take 50 mg by mouth daily., Disp: , Rfl:   Allergies  Allergen Reactions   Ace Inhibitors Other (See Comments)    UNKNOWN REACTION   Duloxetine Other (See Comments)    Drowsiness    I personally reviewed active problem list, medication list, allergies, family history, social history, health maintenance with the patient/caregiver today.   ROS  Constitutional: Negative for fever positive for  weight change.  Respiratory: Negative for cough and shortness of breath.   Cardiovascular: Negative for chest pain or palpitations.  Gastrointestinal: Negative for abdominal pain, no bowel changes.  Musculoskeletal:  Negative for gait problem or joint swelling.  Skin: Negative for rash.  Neurological: Negative for dizziness or headache.  No other specific complaints in a complete review of systems (except as listed in HPI above).   Objective  Vitals:   12/23/20 1059  BP: 130/68  Pulse: 92  Resp: 16  Temp: 97.8 F (36.6 C)  SpO2: 97%  Weight: 222 lb (100.7 kg)  Height: 5\' 8"  (1.727 m)    Body mass index is 33.75 kg/m.  Physical Exam  Constitutional: Patient appears well-developed Obese  No distress.  HEENT: head atraumatic, normocephalic, pupils equal and reactive to light, neck supple, throat within normal limits Cardiovascular: Normal rate, regular rhythm and normal heart sounds.  2/6  murmur heard. BLE edema/ non pitting . Pulmonary/Chest: Effort normal and breath sounds normal. No respiratory distress. Abdominal: Soft.  There is no tenderness. Psychiatric: Patient has a normal mood and affect. behavior is normal. Judgment and thought content normal.     PHQ2/9: Depression screen [facility] 2/9 12/23/2020 08/25/2020 06/21/2020 12/21/2019 12/21/2019  Decreased Interest 0 0 0 0 0  Down, Depressed, Hopeless 0 0 0 0 0  PHQ - 2 Score 0 0 0 0 0  Altered sleeping 0 - 0 0 -  Tired, decreased energy 0 - 0 1 -  Change in appetite 0 - 0 0 -  Feeling bad or failure about yourself  0 - 0 0 -  Trouble concentrating 0 - 0 0 -  Moving slowly or fidgety/restless 0 - 0 0 -  Suicidal thoughts 0 - 0 0 -  PHQ-9 Score 0 - 0 1 -  Difficult doing work/chores - - Not difficult at all Not difficult at all -  Some recent data might be hidden    phq 9 is negative   Fall Risk: Fall Risk  12/23/2020 08/25/2020 06/21/2020 12/21/2019 08/11/2019  Falls in the past year? 0 0 0 0 0  Number falls in past yr: 0 0 0 0 0  Injury with Fall? 0 0 0 0 0  Risk for fall due to : No Fall Risks No Fall Risks - -  Other (Comment)  Risk for fall due to: Comment - - - - lymphadema  Follow up Falls prevention discussed Falls  prevention discussed - - Falls prevention discussed      Functional Status Survey: Is the patient deaf or have difficulty hearing?: No Does the patient have difficulty seeing, even when wearing glasses/contacts?: No Does the patient have difficulty concentrating, remembering, or making decisions?: No Does the patient have difficulty walking or climbing stairs?: No Does the patient have difficulty dressing or bathing?: No Does the patient have difficulty doing errands alone such as visiting a doctor's office or shopping?: No    Assessment & Plan  1. Thoracic aorta atherosclerosis ([place])  On statin and last LDL was better, unable to take it more than twice a week   2. Paroxysmal atrial fibrillation ([facility])  Rate controlled   3. Morbid obesity ([place])  She is losing a lot of weight, explained the importance of going back to Dr. Glennon Mac since no repeat US or endometrial biopsies since endometrial cancer therapy   4. Mild major depression ([place])  Doing well at this time   5. Combined systolic and diastolic congestive heart failure, chronic ([place])  On medical management, including Farxiga  6. Subclavian vein occlusion, right ([place])  Seen by vascular surgeon   7. Hypercalcemia  - PTH, Intact and Calcium - VITAMIN D 25 Hydroxy (Vit-D Deficiency, Fractures)  8. Dyslipidemia  - atorvastatin (LIPITOR) 40 MG tablet; Take 1 tablet (40 mg total) by mouth 2 (two) times a week.  Dispense: 12 tablet; Refill: 1  9. Dysmetabolic syndrome   10. Benign essential HTN  - telmisartan (MICARDIS) 40 MG tablet; Take 1 tablet (40 mg total) by mouth daily.  Dispense: 90 tablet; Refill: 1  11. S/P TAVR (transcatheter aortic valve replacement)   12. Chronic acquired lymphedema   13. Hyperglycemia  - Hemoglobin A1c  14. History of endometrial cancer  - Ambulatory referral to Obstetrics / Gynecology  15. Mild concentric left ventricular hypertrophy (LVH)

## 2020-12-23 ENCOUNTER — Ambulatory Visit (INDEPENDENT_AMBULATORY_CARE_PROVIDER_SITE_OTHER): Payer: Medicare HMO | Admitting: Family Medicine

## 2020-12-23 ENCOUNTER — Encounter: Payer: Self-pay | Admitting: Family Medicine

## 2020-12-23 ENCOUNTER — Other Ambulatory Visit: Payer: Self-pay

## 2020-12-23 VITALS — BP 130/68 | HR 92 | Temp 97.8°F | Resp 16 | Ht 68.0 in | Wt 222.0 lb

## 2020-12-23 DIAGNOSIS — I5042 Chronic combined systolic (congestive) and diastolic (congestive) heart failure: Secondary | ICD-10-CM | POA: Diagnosis not present

## 2020-12-23 DIAGNOSIS — I82B11 Acute embolism and thrombosis of right subclavian vein: Secondary | ICD-10-CM

## 2020-12-23 DIAGNOSIS — I10 Essential (primary) hypertension: Secondary | ICD-10-CM

## 2020-12-23 DIAGNOSIS — Z952 Presence of prosthetic heart valve: Secondary | ICD-10-CM

## 2020-12-23 DIAGNOSIS — Z8542 Personal history of malignant neoplasm of other parts of uterus: Secondary | ICD-10-CM

## 2020-12-23 DIAGNOSIS — F320 Major depressive disorder, single episode, mild: Secondary | ICD-10-CM | POA: Diagnosis not present

## 2020-12-23 DIAGNOSIS — I5040 Unspecified combined systolic (congestive) and diastolic (congestive) heart failure: Secondary | ICD-10-CM | POA: Insufficient documentation

## 2020-12-23 DIAGNOSIS — E66811 Obesity, class 1: Secondary | ICD-10-CM | POA: Insufficient documentation

## 2020-12-23 DIAGNOSIS — R739 Hyperglycemia, unspecified: Secondary | ICD-10-CM | POA: Diagnosis not present

## 2020-12-23 DIAGNOSIS — E8881 Metabolic syndrome: Secondary | ICD-10-CM

## 2020-12-23 DIAGNOSIS — I700 Atherosclerosis of aorta: Secondary | ICD-10-CM

## 2020-12-23 DIAGNOSIS — E785 Hyperlipidemia, unspecified: Secondary | ICD-10-CM

## 2020-12-23 DIAGNOSIS — I890 Lymphedema, not elsewhere classified: Secondary | ICD-10-CM

## 2020-12-23 DIAGNOSIS — I480 Paroxysmal atrial fibrillation: Secondary | ICD-10-CM | POA: Diagnosis not present

## 2020-12-23 DIAGNOSIS — E669 Obesity, unspecified: Secondary | ICD-10-CM | POA: Insufficient documentation

## 2020-12-23 DIAGNOSIS — R69 Illness, unspecified: Secondary | ICD-10-CM | POA: Diagnosis not present

## 2020-12-23 DIAGNOSIS — I517 Cardiomegaly: Secondary | ICD-10-CM

## 2020-12-23 MED ORDER — ATORVASTATIN CALCIUM 40 MG PO TABS: 40.0000 mg | ORAL_TABLET | ORAL | 1 refills | Status: DC

## 2020-12-23 MED ORDER — TELMISARTAN 40 MG PO TABS: 40.0000 mg | ORAL_TABLET | Freq: Every day | ORAL | 1 refills | Status: DC

## 2020-12-26 LAB — HEMOGLOBIN A1C
Hgb A1c MFr Bld: 5.5 % of total Hgb (ref <5.7)
Mean Plasma Glucose: 111 mg/dL
eAG (mmol/L): 6.2 mmol/L

## 2020-12-26 LAB — PTH, INTACT AND CALCIUM
Calcium: 10.6 mg/dL — ABNORMAL HIGH (ref 8.6–10.4)
PTH: 43 pg/mL (ref 16–77)

## 2020-12-26 LAB — VITAMIN D 25 HYDROXY (VIT D DEFICIENCY, FRACTURES): Vit D, 25-Hydroxy: 107 ng/mL — ABNORMAL HIGH (ref 30–100)

## 2021-01-11 ENCOUNTER — Encounter: Payer: Medicare HMO | Admitting: Obstetrics and Gynecology

## 2021-01-31 DIAGNOSIS — E782 Mixed hyperlipidemia: Secondary | ICD-10-CM | POA: Diagnosis not present

## 2021-01-31 DIAGNOSIS — I708 Atherosclerosis of other arteries: Secondary | ICD-10-CM | POA: Diagnosis not present

## 2021-01-31 DIAGNOSIS — Z8679 Personal history of other diseases of the circulatory system: Secondary | ICD-10-CM | POA: Diagnosis not present

## 2021-01-31 DIAGNOSIS — Z952 Presence of prosthetic heart valve: Secondary | ICD-10-CM | POA: Diagnosis not present

## 2021-01-31 DIAGNOSIS — E669 Obesity, unspecified: Secondary | ICD-10-CM | POA: Diagnosis not present

## 2021-01-31 DIAGNOSIS — I209 Angina pectoris, unspecified: Secondary | ICD-10-CM | POA: Diagnosis not present

## 2021-01-31 DIAGNOSIS — I10 Essential (primary) hypertension: Secondary | ICD-10-CM | POA: Diagnosis not present

## 2021-01-31 DIAGNOSIS — I480 Paroxysmal atrial fibrillation: Secondary | ICD-10-CM | POA: Diagnosis not present

## 2021-01-31 DIAGNOSIS — I890 Lymphedema, not elsewhere classified: Secondary | ICD-10-CM | POA: Diagnosis not present

## 2021-02-01 ENCOUNTER — Encounter: Payer: Self-pay | Admitting: Family Medicine

## 2021-02-03 ENCOUNTER — Other Ambulatory Visit: Payer: Self-pay

## 2021-02-03 ENCOUNTER — Encounter: Payer: Self-pay | Admitting: Obstetrics and Gynecology

## 2021-02-03 ENCOUNTER — Ambulatory Visit (INDEPENDENT_AMBULATORY_CARE_PROVIDER_SITE_OTHER): Payer: Medicare HMO | Admitting: Obstetrics and Gynecology

## 2021-02-03 VITALS — BP 140/72 | Ht 68.5 in | Wt 225.8 lb

## 2021-02-03 DIAGNOSIS — Z1231 Encounter for screening mammogram for malignant neoplasm of breast: Secondary | ICD-10-CM

## 2021-02-03 DIAGNOSIS — Z8542 Personal history of malignant neoplasm of other parts of uterus: Secondary | ICD-10-CM | POA: Diagnosis not present

## 2021-02-03 NOTE — Progress Notes (Signed)
Patient ID: Melody Soto, female   DOB: 08-31-1950, 70 y.o.   MRN: [id number]  Reason for Consult: Gynecologic Exam   Referred by Steele Sizer, MD  Subjective:     HPI:  Melody Soto is a 70 y.o. female. She is here for 6 month follow up pelvic exam for her history of grade 1 endometrial cancer. She has previously undergone treatment with primary radiation therapy because she was not a surgical candidate.6 month clinical exams have been recommended for follow up.   She denies vaginal bleeding, change in appetite or pelvic pain.  Gynecological History  No LMP recorded. Patient is postmenopausal. Menarche: 13 Menopause: 43  History of fibroids, polyps, or ovarian cysts? : yes- polyp History of PCOS? no Hstory of Endometriosis? no History of abnormal pap smears? yes Have you had any sexually transmitted infections in the past? no  Last Pap: 2018- unsatisfactory for evaluation  She identifies as a female. She is not sexually active.   Obstetrical History G7 P2  OB History  Gravida Para Term Preterm AB Living  7 2 2   5 2   SAB IAB Ectopic Multiple Live Births  5       2    # Outcome Date GA Lbr Len/2nd Weight Sex Delivery Anes PTL Lv  7 Term 03/30/92 [redacted]w[redacted]d   F Vag-Spont   LIV  6 Term 04/30/86 [redacted]w[redacted]d   F Vag-Spont   LIV  5 SAB           4 SAB           3 SAB           2 SAB           1 SAB             Obstetric Comments  7 pregancies; 2 NSVDs      Past Medical History:  Diagnosis Date   Aortic stenosis    Cataract    Depression    Dysrhythmia    Endometrial cancer determined by uterine biopsy ([place]) 10/29/2016   Rad tx's and internal brachytherapy.   Heart murmur    HX OF   Hyperlipidemia    Hypertension    CONTROLLED ON MEDS   Lymphedema    Shortness of breath dyspnea    Family History  Problem Relation Age of Onset   Stroke Mother    Thyroid nodules Mother    Lung cancer Mother    Cancer Mother    Hypertension Mother    Diabetes  Sister    Hypertension Sister    Aortic aneurysm Father    Early death Father    Heart disease Father    Obesity Father    Stroke Father    Breast cancer Maternal Aunt    Bone cancer Maternal Uncle    Depression Daughter    Anxiety disorder Daughter    Depression Daughter    Past Surgical History:  Procedure Laterality Date   AORTIC VALVE REPLACEMENT Bilateral 02/27/2017   CARDIAC CATHETERIZATION     CARDIAC VALVE REPLACEMENT  02/27/2017   CARDIOVERSION  10/2019   cataract surgery Bilateral 05/24/2014   second eye 06/07/2014   COLONOSCOPY WITH PROPOFOL N/A 09/24/2019   Procedure: COLONOSCOPY WITH PROPOFOL;  Surgeon: [name], MD;  Location: [facility] ENDOSCOPY;  Service: Gastroenterology;  Laterality: N/A;   COLONOSCOPY WITH PROPOFOL N/A 12/10/2019   Procedure: COLONOSCOPY WITH PROPOFOL;  Surgeon: [name], MD;  Location: [facility] ENDOSCOPY;  Service: Endoscopy;  Laterality: N/A;   CORONARY ARTERY BYPASS GRAFT     EYE SURGERY     MELANOMA EXCISION     OVARIAN CYST REMOVAL     EXPLORATORY LAPAROTOMY   TONSILLECTOMY      Short Social History:  Social History   Tobacco Use   Smoking status: Never   Smokeless tobacco: Never  Substance Use Topics   Alcohol use: Not Currently    Allergies  Allergen Reactions   Ace Inhibitors Other (See Comments)    UNKNOWN REACTION   Duloxetine Other (See Comments)    Drowsiness    Current Outpatient Medications  Medication Sig Dispense Refill   apixaban (ELIQUIS) 5 MG TABS tablet Take 5 mg by mouth 2 (two) times daily.     Ascorbic Acid (VITAMIN C) 500 MG CAPS Take 1,000 mg by mouth in the morning and at bedtime.     atorvastatin (LIPITOR) 40 MG tablet Take 1 tablet (40 mg total) by mouth 2 (two) times a week. 12 tablet 1   B Complex Vitamins (B COMPLEX 1 PO) Take by mouth.     Cholecalciferol (VITAMIN D-3) 125 MCG (5000 UT) TABS Take 3 tablets by mouth daily.     dofetilide (TIKOSYN) 125 MCG capsule Take 125 mcg by  mouth 2 (two) times daily.     JARDIANCE 10 MG TABS tablet Take 10 mg by mouth daily.     Magnesium 500 MG CAPS Take 800 mg by mouth in the morning and at bedtime.      metoprolol succinate (TOPROL-XL) 50 MG 24 hr tablet Take 50 mg by mouth 2 (two) times daily.     spironolactone (ALDACTONE) 25 MG tablet Take 25 mg by mouth 2 (two) times daily.     telmisartan (MICARDIS) 40 MG tablet Take 1 tablet (40 mg total) by mouth daily. 90 tablet 1   zinc gluconate 50 MG tablet Take 50 mg by mouth daily.     No current facility-administered medications for this visit.    Review of Systems  Constitutional: Negative for chills, fatigue, fever and unexpected weight change.  HENT: Negative for trouble swallowing.  Eyes: Negative for loss of vision.  Respiratory: Negative for cough, shortness of breath and wheezing.  Cardiovascular: Negative for chest pain, leg swelling, palpitations and syncope.  GI: Negative for abdominal pain, blood in stool, diarrhea, nausea and vomiting.  GU: Negative for difficulty urinating, dysuria, frequency and hematuria.  Musculoskeletal: Negative for back pain, leg pain and joint pain.  Skin: Negative for rash.  Neurological: Negative for dizziness, headaches, light-headedness, numbness and seizures.  Psychiatric: Negative for behavioral problem, confusion, depressed mood and sleep disturbance.       Objective:  Objective   Vitals:   02/03/21 0955  BP: 140/72  Weight: 225 lb 12.8 oz (102.4 kg)  Height: 5' 8.5" (1.74 m)   Body mass index is 33.83 kg/m.  Physical Exam Constitutional:      General: She is not in acute distress.    Appearance: Normal appearance.  HENT:     Head: Normocephalic and atraumatic.  Eyes:     Extraocular Movements: Extraocular movements intact.     Pupils: Pupils are equal, round, and reactive to light.  Cardiovascular:     Rate and Rhythm: Normal rate and regular rhythm.  Pulmonary:     Effort: Pulmonary effort is normal.      Breath sounds: Normal breath sounds.  Abdominal:     General: Abdomen is flat.  Palpations: Abdomen is soft.  Genitourinary:    Comments: External: Normal appearing vulva. No lesions noted.  Speculum examination: Normal appearing cervix. No blood in the vaginal vault. No discharge.   Bimanual examination: Uterus midline, non-tender, normal in size, shape and contour.  No CMT. No adnexal masses. No adnexal tenderness. Pelvis not fixed.   breast equal without skin changes, nipple discharge, breast lump or enlarged lymph nodes  Skin:    General: Skin is warm and dry.  Neurological:     General: No focal deficit present.     Mental Status: She is alert and oriented to person, place, and time.  Psychiatric:        Mood and Affect: Mood normal.        Behavior: Behavior normal.        Thought Content: Thought content normal.    Assessment/Plan:     70 yo with history of endometrial cancer  Normal clinical exam today.  Has planned oncology follow-up in 6 months.  And to return here in 1 year for another clinical exam.  Encouraged the patient to call if she develops any vaginal bleeding.  Breast mammogram ordered and patient encouraged to update.  More than 20 minutes were spent face to face with the patient in the room, reviewing the medical record, labs and images, and coordinating care for the patient. The plan of management was discussed in detail and counseling was provided.     Adrian Prows MD [facility], [facility] 02/03/2021 10:41 AM

## 2021-03-02 DIAGNOSIS — I10 Essential (primary) hypertension: Secondary | ICD-10-CM | POA: Diagnosis not present

## 2021-03-02 DIAGNOSIS — Z23 Encounter for immunization: Secondary | ICD-10-CM | POA: Diagnosis not present

## 2021-03-02 DIAGNOSIS — Z952 Presence of prosthetic heart valve: Secondary | ICD-10-CM | POA: Diagnosis not present

## 2021-03-30 ENCOUNTER — Encounter: Payer: Self-pay | Admitting: Family Medicine

## 2021-03-30 ENCOUNTER — Other Ambulatory Visit: Payer: Self-pay

## 2021-03-30 DIAGNOSIS — E785 Hyperlipidemia, unspecified: Secondary | ICD-10-CM

## 2021-03-31 MED ORDER — ATORVASTATIN CALCIUM 40 MG PO TABS: 40.0000 mg | ORAL_TABLET | ORAL | 1 refills | Status: DC

## 2021-04-06 ENCOUNTER — Other Ambulatory Visit: Payer: Self-pay

## 2021-04-06 ENCOUNTER — Ambulatory Visit
Admission: RE | Admit: 2021-04-06 | Discharge: 2021-04-06 | Disposition: A | Discharge status: Home / Self Care | Payer: Medicare HMO | Source: Ambulatory Visit | Attending: Obstetrics and Gynecology | Admitting: Obstetrics and Gynecology

## 2021-04-06 DIAGNOSIS — Z1231 Encounter for screening mammogram for malignant neoplasm of breast: Secondary | ICD-10-CM | POA: Insufficient documentation

## 2021-04-07 ENCOUNTER — Ambulatory Visit (INDEPENDENT_AMBULATORY_CARE_PROVIDER_SITE_OTHER): Payer: Medicare HMO | Admitting: Vascular Surgery

## 2021-04-07 ENCOUNTER — Encounter (INDEPENDENT_AMBULATORY_CARE_PROVIDER_SITE_OTHER): Payer: Medicare HMO

## 2021-05-26 ENCOUNTER — Ambulatory Visit: Payer: Medicare HMO | Admitting: Radiation Oncology

## 2021-06-13 ENCOUNTER — Ambulatory Visit
Admission: RE | Admit: 2021-06-13 | Discharge: 2021-06-13 | Disposition: A | Discharge status: Home / Self Care | Payer: Medicare HMO | Source: Ambulatory Visit | Attending: Radiation Oncology | Admitting: Radiation Oncology

## 2021-06-13 VITALS — BP 135/52 | HR 72 | Temp 96.6°F | Ht 68.5 in | Wt 228.5 lb

## 2021-06-13 DIAGNOSIS — Z08 Encounter for follow-up examination after completed treatment for malignant neoplasm: Secondary | ICD-10-CM | POA: Diagnosis not present

## 2021-06-13 DIAGNOSIS — I350 Nonrheumatic aortic (valve) stenosis: Secondary | ICD-10-CM | POA: Diagnosis not present

## 2021-06-13 DIAGNOSIS — Z923 Personal history of irradiation: Secondary | ICD-10-CM | POA: Insufficient documentation

## 2021-06-13 DIAGNOSIS — Z8542 Personal history of malignant neoplasm of other parts of uterus: Secondary | ICD-10-CM | POA: Insufficient documentation

## 2021-06-13 DIAGNOSIS — C541 Malignant neoplasm of endometrium: Secondary | ICD-10-CM

## 2021-06-13 DIAGNOSIS — R600 Localized edema: Secondary | ICD-10-CM | POA: Diagnosis not present

## 2021-06-13 NOTE — Progress Notes (Signed)
Radiation Oncology ?Follow up Note ? ?Name: Melody Soto   ?Date:   06/13/2021 ?MRN:  [id number] ?DOB: 22-Dec-1950  ? ? ?This 71 y.o. female presents to the clinic today for over 4-year follow-up status post external beam radiation therapy as well as vaginal brachytherapy for stage IIa well differentiated endometrial carcinoma. ? ?REFERRING PROVIDER: Steele Sizer, MD ? ?HPI: Patient is a 71 year old female now out over 4 years having completed both external beam radiation therapy as well as vaginal brachytherapy for stage IIa adenocarcinoma of the endometrium.  Seen today in routine follow-up she is doing well from a GYN standpoint she is having no pelvic pain no increased lower urinary tract symptoms diarrhea..  She has multiple cardiac problems including cardiac dysrhythmia, aortic stenosis and bilateral lower extremity edema.  She is having no vaginal discharge.  She had mammograms back in February which I have reviewed were BI-RADS 1 negative.  She had CT scans of chest abdomen pelvis back in July which I have reviewed showing focal occlusion of the central right subclavian vein status post TAVR. ? ?COMPLICATIONS OF TREATMENT: none ? ?FOLLOW UP COMPLIANCE: keeps appointments  ? ?PHYSICAL EXAM:  ?BP (!) 135/52   Pulse 72   Temp (!) 96.6 ?F (35.9 ?C)   Ht 5' 8.5" (1.74 m)   Wt 228 lb 8 oz (103.6 kg)   BMI 34.24 kg/m?  ?Patient has bilateral lower extremity edema.  Well-developed well-nourished patient in NAD. HEENT reveals PERLA, EOMI, discs not visualized.  Oral cavity is clear. No oral mucosal lesions are identified. Neck is clear without evidence of cervical or supraclavicular adenopathy. Lungs are clear to A&P. Cardiac examination is essentially unremarkable with regular rate and rhythm without murmur rub or thrill. Abdomen is benign with no organomegaly or masses noted. Motor sensory and DTR levels are equal and symmetric in the upper and lower extremities. Cranial nerves II through XII are grossly  intact. Proprioception is intact. No peripheral adenopathy or edema is identified. No motor or sensory levels are noted. Crude visual fields are within normal range. ? ?RADIOLOGY RESULTS: CT scans and mammograms reviewed compatible with above-stated findings ? ?PLAN: Present time patient is out over 4 years with no evidence of disease as far as her GYN cancers concerns.  I am going to turn follow-up care over to GYN oncology.  I be happy to reevaluate the patient anytime should that be indicated.  Patient knows to call with any concerns. ? ?I would like to take this opportunity to thank you for allowing me to participate in the care of your patient.. ?  ? Noreene Filbert, MD ? ?

## 2021-06-21 ENCOUNTER — Other Ambulatory Visit: Payer: Self-pay | Admitting: Family Medicine

## 2021-06-21 DIAGNOSIS — E785 Hyperlipidemia, unspecified: Secondary | ICD-10-CM

## 2021-06-22 DIAGNOSIS — Z952 Presence of prosthetic heart valve: Secondary | ICD-10-CM | POA: Diagnosis not present

## 2021-06-22 DIAGNOSIS — E782 Mixed hyperlipidemia: Secondary | ICD-10-CM | POA: Diagnosis not present

## 2021-06-22 DIAGNOSIS — I890 Lymphedema, not elsewhere classified: Secondary | ICD-10-CM | POA: Diagnosis not present

## 2021-06-22 DIAGNOSIS — E669 Obesity, unspecified: Secondary | ICD-10-CM | POA: Diagnosis not present

## 2021-06-22 DIAGNOSIS — I480 Paroxysmal atrial fibrillation: Secondary | ICD-10-CM | POA: Diagnosis not present

## 2021-06-22 DIAGNOSIS — I209 Angina pectoris, unspecified: Secondary | ICD-10-CM | POA: Diagnosis not present

## 2021-06-22 DIAGNOSIS — I10 Essential (primary) hypertension: Secondary | ICD-10-CM | POA: Diagnosis not present

## 2021-06-22 DIAGNOSIS — I708 Atherosclerosis of other arteries: Secondary | ICD-10-CM | POA: Diagnosis not present

## 2021-06-22 DIAGNOSIS — Z8679 Personal history of other diseases of the circulatory system: Secondary | ICD-10-CM | POA: Diagnosis not present

## 2021-06-23 ENCOUNTER — Ambulatory Visit (INDEPENDENT_AMBULATORY_CARE_PROVIDER_SITE_OTHER): Payer: Medicare HMO | Admitting: Vascular Surgery

## 2021-06-23 ENCOUNTER — Ambulatory Visit: Payer: Medicare HMO | Admitting: Family Medicine

## 2021-06-23 ENCOUNTER — Encounter (INDEPENDENT_AMBULATORY_CARE_PROVIDER_SITE_OTHER): Payer: Medicare HMO

## 2021-07-26 ENCOUNTER — Other Ambulatory Visit: Payer: Self-pay | Admitting: Family Medicine

## 2021-07-26 DIAGNOSIS — I10 Essential (primary) hypertension: Secondary | ICD-10-CM

## 2021-07-27 NOTE — Progress Notes (Signed)
Name: Melody Soto   MRN: [id number]    DOB: Apr 29, 1950   Date:07/28/2021       Progress Note  Subjective  Chief Complaint  Follow Up  HPI  History of endometrial cancer:  seeing Dr. Donella Stade yearly now, last visit 05/2020 and last visit with surgical oncologist Dr. Alto [place] 07/2020 . She denies any vaginal bleeding or pelvic pain. She will see her once more this year and may be released    HTN:she is taking Micardis, Toprol XL and denies side effects. No chest pain or palpitation , bp towards low end of normal today but she denies orthostatic changes    Morbid obesity: she went on Keto diet since she saw her A1C was 6.1 % back in March 2021 , she is on intermittent fasting also and low carb diet . Weight down from 295 lbs March 2021 it has gradually been going down , but gained a few pounds since last visit. Today's weight is 228 lbs - weight has stabilized    Metabolic syndrome: last hgb A1C was up to 6.1 % but last visit it was down to 5.5 % Denies polyphagia, polyuria or polydipsia. She has been losing weigh ton low carb diet She was on Jardiance for CHF   Lymphedema: she responded well to PT ,she has been wearing compression stocking hoses, she only wears pumps occasionally and symptoms are stable  Subclavian Vein Occlusion:/ Right  incidental finding on CT  she saw vascular surgeon and was given reassurance . She is on Eliquis    S/p TAVR: she had surgery done at [facility] on 02/27/2017 for severe aortic stenosis. She is on Toprol XL  No palpitation, chest pain or orthopnea. She also has a history of CHF , medically treated now and doing well - she is not currently taking Jardiance because it is on hold at the pharmacy, advised her to resume taking it   Echo done 02/2020     NORMAL LEFT VENTRICULAR SYSTOLIC FUNCTION WITH MILD LVH    NORMAL RIGHT VENTRICULAR SYSTOLIC FUNCTION    VALVULAR REGURGITATION: MILD AR, TRIVIAL MR, TRIVIAL PR, TRIVIAL TR    MILD  PARAVALVULAR AR UNCHANGED FROM  PRIORS    3D acquisition and reconstructions were performed as part of this    examination to more accurately quantify the effects of identified    structural abnormalities as part of the exam. (post-processing on an    Independent workstation).    Echo 09/2019 at [facility] ---------------------------------------------------------------    SEVERE LV DYSFUNCTION (See above)    MILD RV SYSTOLIC DYSFUNCTION (See above)    VALVULAR REGURGITATION: MILD AR, MILD MR, TRIVIAL PR, MILD TR    DILATED ASCENDING AORTA 3.8 cm.    S/P TAVR WITH MILD-MODERATE PARAVALVULAR AORTIC REGURGITATION, UNCHANGED    FROM PRIOR.     Echo was 12/2018  INTERPRETATION ---------------------------------------------------------------   NORMAL LEFT VENTRICULAR SYSTOLIC FUNCTION WITH MILD LVH   NORMAL RIGHT VENTRICULAR SYSTOLIC FUNCTION   VALVULAR REGURGITATION: MILD AR, TRIVIAL MR, TRIVIAL PR   S/P TAVR WITH MILD-TO-MODERATE PARAVALVULAR REGURGITATION   ASCENDING AO FLOW TURBULENCE ( PEAK VEL. 2.7 M/S)  Transesophageal echo done at [facility] 11/09/2019  CONCLUSIONS ------------------------------------------------------------------    1. No LAA thrombus    2. Moderate LV dysfunction    3. Well-seated TAVR, good mobility. Trivial paravalvular AR    4. Mild MR      Major Depression: she took Celexa many years ago She is still unable to  see her grand-daughter. She has not been able to see her daughter in the past 3 years. She re-connected with her ex-husband - they are now friends. She is coping by taking care of herself and has a good circle of friends    Urinary incontinence: she was seen by Urologist , tried medication but did not improve symptoms, she states nocturia has improved - down from 3 times per night to once per night now. She has urgency - she still has to wear pads She does not want to do PT   Atrial fibrillation: found during colonoscopy this Summer, procedure was scheuduled for 09/09/2019  but was cancelled due to Afib. She went to [facility], had multiple procedures done and cardioversion . She is now on Toprol , Spirolactone, Jardiance ( not currently - on hold at pharmacy) and Tikosyn since the Summer 2022 , rhythm has been controlled. Also on Eliquis . She denies sob or chest pain She has intermittent palpitation but seldom . 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[place]) 12/23/2020   Mild major depression ([place]) 12/23/2020   Morbid obesity ([place]) 12/23/2020   Thoracic aorta atherosclerosis ([place]) 12/23/2020   Subclavian vein occlusion, right ([place]) 10/04/2020   Cecal polyp    Polyp of sigmoid colon    Paroxysmal atrial fibrillation ([place]) 10/02/2019   Thoracic aortic ectasia ([place]) 08/31/2019   Monitoring for anticoagulant use 01/06/2019   Diverticulosis of colon 08/08/2018   Cholelithiasis 08/08/2018   Endometrial cancer ([place]) 04/10/2017   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   Chronic acquired lymphedema 01/08/2017   FIGO stage II endometrial cancer ([place]) 10/29/2016   Left ventricular hypertrophy 11/27/2014   H/O aortic valve replacement 08/21/2014   Benign essential HTN 08/21/2014   Dyslipidemia 08/21/2014   Cardiac dysrhythmia 68/34/1962   Dysmetabolic syndrome 22/97/9892   Female genuine stress incontinence 08/21/2014   History of shingles 08/21/2014   Personal history of skin cancer 11/24/2009    Past Surgical History:  Procedure Laterality Date   AORTIC VALVE REPLACEMENT Bilateral 02/27/2017   CARDIAC CATHETERIZATION     CARDIAC VALVE REPLACEMENT  02/27/2017   CARDIOVERSION  10/2019   cataract surgery Bilateral 05/24/2014   second eye 06/07/2014   COLONOSCOPY WITH  PROPOFOL N/A 09/24/2019   Procedure: COLONOSCOPY WITH PROPOFOL;  Surgeon: [name], MD;  Location: [facility] ENDOSCOPY;  Service: Gastroenterology;  Laterality: N/A;   COLONOSCOPY WITH PROPOFOL N/A 12/10/2019   Procedure: COLONOSCOPY WITH PROPOFOL;  Surgeon: [name], MD;  Location: [facility] ENDOSCOPY;  Service: Endoscopy;  Laterality: N/A;   CORONARY ARTERY BYPASS GRAFT     EYE SURGERY     MELANOMA EXCISION     OVARIAN CYST REMOVAL     EXPLORATORY LAPAROTOMY   TONSILLECTOMY      Family History  Problem Relation Age of Onset   Stroke Mother    Thyroid nodules Mother    Lung cancer Mother    Cancer Mother    Hypertension Mother    Diabetes Sister    Hypertension Sister    Aortic aneurysm Father    Early death Father    Heart disease Father    Obesity Father    Stroke Father  Breast cancer Maternal Aunt    Bone cancer Maternal Uncle    Depression Daughter    Anxiety disorder Daughter    Depression Daughter     Social History   Tobacco Use   Smoking status: Never   Smokeless tobacco: Never  Substance Use Topics   Alcohol use: Not Currently     Current Outpatient Medications:    apixaban (ELIQUIS) 5 MG TABS tablet, Take 5 mg by mouth 2 (two) times daily., Disp: , Rfl:    Ascorbic Acid (VITAMIN C) 1000 MG tablet, Take 1,000 mg by mouth in the morning and at bedtime., Disp: , Rfl:    atorvastatin (LIPITOR) 40 MG tablet, TAKE 1 TABLET (40 MG TOTAL) BY MOUTH 2 (TWO) TIMES A WEEK., Disp: 24 tablet, Rfl: 0   B Complex Vitamins (B COMPLEX 1 PO), Take by mouth., Disp: , Rfl:    dofetilide (TIKOSYN) 125 MCG capsule, Take 125 mcg by mouth 2 (two) times daily., Disp: , Rfl:    JARDIANCE 10 MG TABS tablet, Take 10 mg by mouth daily., Disp: , Rfl:    Magnesium 500 MG CAPS, Take 800 mg by mouth in the morning and at bedtime. , Disp: , Rfl:    metoprolol succinate (TOPROL-XL) 50 MG 24 hr tablet, Take 50 mg by mouth 2 (two) times daily., Disp: , Rfl:    Omega-3 Fatty  Acids (FISH OIL) 1000 MG CAPS, Take 1 capsule by mouth daily., Disp: , Rfl:    spironolactone (ALDACTONE) 25 MG tablet, Take 25 mg by mouth 2 (two) times daily., Disp: , Rfl:    telmisartan (MICARDIS) 40 MG tablet, TAKE 1 TABLET BY MOUTH EVERY DAY, Disp: 90 tablet, Rfl: 0   Vitamin D-Vitamin K (VITAMIN K2-VITAMIN D3 PO), Take by mouth in the morning and at bedtime., Disp: , Rfl:    vitamin E 200 UNIT capsule, Take 200 Units by mouth 2 (two) times daily., Disp: , Rfl:    zinc gluconate 50 MG tablet, Take 50 mg by mouth daily., Disp: , Rfl:   Allergies  Allergen Reactions   Ace Inhibitors Other (See Comments)    UNKNOWN REACTION   Duloxetine Other (See Comments)    Drowsiness    I personally reviewed active problem list, medication list, allergies, family history, social history, health maintenance with the patient/caregiver today.   ROS  Constitutional: Negative for fever or weight change.  Respiratory: Negative for cough and shortness of breath.   Cardiovascular: Negative for chest pain or palpitations.  Gastrointestinal: Negative for abdominal pain, no bowel changes.  Musculoskeletal: Negative for gait problem or joint swelling.  Skin: Negative for rash.  Neurological: Negative for dizziness or headache.  No other specific complaints in a complete review of systems (except as listed in HPI above).   Objective  Vitals:   07/28/21 1032  BP: 118/70  Pulse: 92  Resp: 16  SpO2: 95%  Weight: 228 lb (103.4 kg)  Height: '5\' 8"'$  (1.727 m)    Body mass index is 34.67 kg/m.  Physical Exam  Constitutional: Patient appears well-developed and well-nourished. Obese  No distress.  HEENT: head atraumatic, normocephalic, pupils equal and reactive to light, neck supple Cardiovascular: Normal rate, regular rhythm and normal heart sounds.  No murmur heard. Non pitting BLE edema. Pulmonary/Chest: Effort normal and breath sounds normal. No respiratory distress. Abdominal: Soft.  There is no  tenderness. Psychiatric: Patient has a normal mood and affect. behavior is normal. Judgment and thought content normal.   PHQ2/9:  07/28/2021   10:31 AM 12/23/2020   10:58 AM 08/25/2020   10:21 AM 06/21/2020    1:23 PM 12/21/2019    1:57 PM  Depression screen PHQ 2/9  Decreased Interest 0 0 0 0 0  Down, Depressed, Hopeless 1 0 0 0 0  PHQ - 2 Score 1 0 0 0 0  Altered sleeping 0 0  0 0  Tired, decreased energy 1 0  0 1  Change in appetite 0 0  0 0  Feeling bad or failure about yourself  0 0  0 0  Trouble concentrating 0 0  0 0  Moving slowly or fidgety/restless 0 0  0 0  Suicidal thoughts 0 0  0 0  PHQ-9 Score 2 0  0 1  Difficult doing work/chores    Not difficult at all Not difficult at all    phq 9 is positive   Fall Risk:    07/28/2021   10:31 AM 12/23/2020   10:58 AM 08/25/2020   10:25 AM 06/21/2020    1:22 PM 12/21/2019    1:55 PM  Fall Risk   Falls in the past year? 0 0 0 0 0  Number falls in past yr: 0 0 0 0 0  Injury with Fall? 0 0 0 0 0  Risk for fall due to : No Fall Risks No Fall Risks No Fall Risks    Follow up Falls prevention discussed Falls prevention discussed Falls prevention discussed        Functional Status Survey: Is the patient deaf or have difficulty hearing?: No Does the patient have difficulty seeing, even when wearing glasses/contacts?: No Does the patient have difficulty concentrating, remembering, or making decisions?: No Does the patient have difficulty walking or climbing stairs?: No Does the patient have difficulty dressing or bathing?: No Does the patient have difficulty doing errands alone such as visiting a doctor's office or shopping?: No    Assessment & Plan  Problem List Items Addressed This Visit     Benign essential HTN    Controlled, discussed monitoring for orthostatic changes       Relevant Medications   atorvastatin (LIPITOR) 40 MG tablet (Start on 07/31/2021)   telmisartan (MICARDIS) 40 MG tablet   Other Relevant  Orders   CBC with Differential/Platelet   COMPLETE METABOLIC PANEL WITH GFR   FIGO stage II endometrial cancer ([place])    Keep follow up with gyn       Chronic acquired lymphedema    Stable       Paroxysmal atrial fibrillation ([place])    Rate controlled, compliant with medications       Relevant Medications   atorvastatin (LIPITOR) 40 MG tablet (Start on 07/31/2021)   telmisartan (MICARDIS) 40 MG tablet   Subclavian vein occlusion, right ([place])    Seen by vascular surgeon and given reassurance       Relevant Medications   atorvastatin (LIPITOR) 40 MG tablet (Start on 07/31/2021)   telmisartan (MICARDIS) 40 MG tablet   Obesity (BMI 30.0-34.9)   Thoracic aorta atherosclerosis ([facility])    On statin therapy       Relevant Medications   atorvastatin (LIPITOR) 40 MG tablet (Start on 07/31/2021)   telmisartan (MICARDIS) 40 MG tablet   Hypercalcemia    Normal PTH, we will recheck calcium today       Chronic combined systolic and diastolic congestive heart failure ([place]) - Primary    On medical management, for some reason Jardiance is at  the pharmacy and per patient states - on hold. Advised her to contact pharmacy to find out what is happening       Relevant Medications   atorvastatin (LIPITOR) 40 MG tablet (Start on 07/31/2021)   telmisartan (MICARDIS) 40 MG tablet   Elevated hematocrit    Normal ESS, we will recheck level       Relevant Orders   CBC with Differential/Platelet   Vitamin D deficiency   Relevant Orders   VITAMIN D 25 Hydroxy (Vit-D Deficiency, Fractures)   Hyperglycemia   Relevant Orders   Hemoglobin A1c   Skin lesion    On left forearm with personal history of skin cancer, erythematous pruriginous papule about a quarter in size      Relevant Orders   Ambulatory referral to Dermatology   Mild major depression ([place])   Dyslipidemia   Relevant Medications   atorvastatin (LIPITOR) 40 MG tablet (Start on 07/31/2021)   Other Relevant Orders   Lipid panel   S/P  TAVR (transcatheter aortic valve replacement)   Personal history of skin cancer   Relevant Orders   Ambulatory referral to Dermatology   Dysmetabolic syndrome

## 2021-07-28 ENCOUNTER — Ambulatory Visit (INDEPENDENT_AMBULATORY_CARE_PROVIDER_SITE_OTHER): Payer: Medicare HMO | Admitting: Family Medicine

## 2021-07-28 ENCOUNTER — Encounter: Payer: Self-pay | Admitting: Family Medicine

## 2021-07-28 VITALS — BP 118/70 | HR 92 | Resp 16 | Ht 68.0 in | Wt 228.0 lb

## 2021-07-28 DIAGNOSIS — E8881 Metabolic syndrome: Secondary | ICD-10-CM | POA: Diagnosis not present

## 2021-07-28 DIAGNOSIS — Z860100 Personal history of colon polyps, unspecified: Secondary | ICD-10-CM | POA: Insufficient documentation

## 2021-07-28 DIAGNOSIS — R739 Hyperglycemia, unspecified: Secondary | ICD-10-CM | POA: Diagnosis not present

## 2021-07-28 DIAGNOSIS — E559 Vitamin D deficiency, unspecified: Secondary | ICD-10-CM | POA: Diagnosis not present

## 2021-07-28 DIAGNOSIS — Z952 Presence of prosthetic heart valve: Secondary | ICD-10-CM

## 2021-07-28 DIAGNOSIS — Z85828 Personal history of other malignant neoplasm of skin: Secondary | ICD-10-CM

## 2021-07-28 DIAGNOSIS — I890 Lymphedema, not elsewhere classified: Secondary | ICD-10-CM

## 2021-07-28 DIAGNOSIS — E785 Hyperlipidemia, unspecified: Secondary | ICD-10-CM | POA: Diagnosis not present

## 2021-07-28 DIAGNOSIS — F320 Major depressive disorder, single episode, mild: Secondary | ICD-10-CM

## 2021-07-28 DIAGNOSIS — I5042 Chronic combined systolic (congestive) and diastolic (congestive) heart failure: Secondary | ICD-10-CM

## 2021-07-28 DIAGNOSIS — R718 Other abnormality of red blood cells: Secondary | ICD-10-CM | POA: Diagnosis not present

## 2021-07-28 DIAGNOSIS — I480 Paroxysmal atrial fibrillation: Secondary | ICD-10-CM

## 2021-07-28 DIAGNOSIS — I700 Atherosclerosis of aorta: Secondary | ICD-10-CM | POA: Diagnosis not present

## 2021-07-28 DIAGNOSIS — I10 Essential (primary) hypertension: Secondary | ICD-10-CM | POA: Diagnosis not present

## 2021-07-28 DIAGNOSIS — I82B11 Acute embolism and thrombosis of right subclavian vein: Secondary | ICD-10-CM

## 2021-07-28 DIAGNOSIS — L989 Disorder of the skin and subcutaneous tissue, unspecified: Secondary | ICD-10-CM

## 2021-07-28 DIAGNOSIS — E669 Obesity, unspecified: Secondary | ICD-10-CM | POA: Diagnosis not present

## 2021-07-28 DIAGNOSIS — R69 Illness, unspecified: Secondary | ICD-10-CM | POA: Diagnosis not present

## 2021-07-28 DIAGNOSIS — E66811 Obesity, class 1: Secondary | ICD-10-CM

## 2021-07-28 DIAGNOSIS — C541 Malignant neoplasm of endometrium: Secondary | ICD-10-CM

## 2021-07-28 MED ORDER — ATORVASTATIN CALCIUM 40 MG PO TABS: 40.0000 mg | ORAL_TABLET | ORAL | 1 refills | Status: DC

## 2021-07-28 MED ORDER — TELMISARTAN 40 MG PO TABS: 40.0000 mg | ORAL_TABLET | Freq: Every day | ORAL | 1 refills | Status: DC

## 2021-07-28 NOTE — Assessment & Plan Note (Signed)
Stable

## 2021-07-28 NOTE — Assessment & Plan Note (Signed)
Seen by vascular surgeon and given reassurance

## 2021-07-28 NOTE — Assessment & Plan Note (Signed)
Rate controlled, compliant with medications

## 2021-07-28 NOTE — Assessment & Plan Note (Signed)
Controlled, discussed monitoring for orthostatic changes

## 2021-07-28 NOTE — Assessment & Plan Note (Signed)
Keep follow up with gyn

## 2021-07-28 NOTE — Assessment & Plan Note (Signed)
Normal PTH, we will recheck calcium today

## 2021-07-28 NOTE — Assessment & Plan Note (Signed)
On statin therapy 

## 2021-07-28 NOTE — Assessment & Plan Note (Signed)
On left forearm with personal history of skin cancer, erythematous pruriginous papule about a quarter in size

## 2021-07-28 NOTE — Assessment & Plan Note (Signed)
Normal ESS, we will recheck level

## 2021-07-28 NOTE — Assessment & Plan Note (Signed)
On medical management, for some reason Melody Soto is at the pharmacy and per patient states - on hold. Advised her to contact pharmacy to find out what is happening

## 2021-07-29 LAB — COMPLETE METABOLIC PANEL WITH GFR
AG Ratio: 1.8 (calc) (ref 1.0–2.5)
ALT: 25 U/L (ref 6–29)
AST: 15 U/L (ref 10–35)
Albumin: 4.4 g/dL (ref 3.6–5.1)
Alkaline phosphatase (APISO): 105 U/L (ref 37–153)
BUN: 20 mg/dL (ref 7–25)
CO2: 27 mmol/L (ref 20–32)
Calcium: 10.4 mg/dL (ref 8.6–10.4)
Chloride: 104 mmol/L (ref 98–110)
Creat: 0.68 mg/dL (ref 0.50–1.05)
Globulin: 2.4 g/dL (calc) (ref 1.9–3.7)
Glucose, Bld: 128 mg/dL — ABNORMAL HIGH (ref 65–99)
Potassium: 4.6 mmol/L (ref 3.5–5.3)
Sodium: 140 mmol/L (ref 135–146)
Total Bilirubin: 0.5 mg/dL (ref 0.2–1.2)
Total Protein: 6.8 g/dL (ref 6.1–8.1)
eGFR: 94 mL/min/{1.73_m2} (ref >=60)

## 2021-07-29 LAB — LIPID PANEL
Cholesterol: 222 mg/dL — ABNORMAL HIGH (ref <200)
HDL: 65 mg/dL (ref >=50)
LDL Cholesterol (Calc): 135 mg/dL (calc) — ABNORMAL HIGH
Non-HDL Cholesterol (Calc): 157 mg/dL (calc) — ABNORMAL HIGH (ref <130)
Total CHOL/HDL Ratio: 3.4 (calc) (ref <5.0)
Triglycerides: 112 mg/dL (ref <150)

## 2021-07-29 LAB — CBC WITH DIFFERENTIAL/PLATELET
Absolute Monocytes: 638 cells/uL (ref 200–950)
Basophils Absolute: 53 cells/uL (ref 0–200)
Basophils Relative: 0.7 %
Eosinophils Absolute: 30 cells/uL (ref 15–500)
Eosinophils Relative: 0.4 %
HCT: 42.7 % (ref 35.0–45.0)
Hemoglobin: 14.3 g/dL (ref 11.7–15.5)
Lymphs Abs: 1373 cells/uL (ref 850–3900)
MCH: 33.6 pg — ABNORMAL HIGH (ref 27.0–33.0)
MCHC: 33.5 g/dL (ref 32.0–36.0)
MCV: 100.5 fL — ABNORMAL HIGH (ref 80.0–100.0)
MPV: 9.9 fL (ref 7.5–12.5)
Monocytes Relative: 8.5 %
Neutro Abs: 5408 cells/uL (ref 1500–7800)
Neutrophils Relative %: 72.1 %
Platelets: 291 10*3/uL (ref 140–400)
RBC: 4.25 10*6/uL (ref 3.80–5.10)
RDW: 12.6 % (ref 11.0–15.0)
Total Lymphocyte: 18.3 %
WBC: 7.5 10*3/uL (ref 3.8–10.8)

## 2021-07-29 LAB — HEMOGLOBIN A1C
Hgb A1c MFr Bld: 5.5 % of total Hgb (ref <5.7)
Mean Plasma Glucose: 111 mg/dL
eAG (mmol/L): 6.2 mmol/L

## 2021-07-29 LAB — VITAMIN D 25 HYDROXY (VIT D DEFICIENCY, FRACTURES): Vit D, 25-Hydroxy: 93 ng/mL (ref 30–100)

## 2021-07-30 ENCOUNTER — Encounter: Payer: Self-pay | Admitting: Family Medicine

## 2021-08-08 ENCOUNTER — Encounter (INDEPENDENT_AMBULATORY_CARE_PROVIDER_SITE_OTHER): Payer: Self-pay

## 2021-08-08 ENCOUNTER — Encounter (INDEPENDENT_AMBULATORY_CARE_PROVIDER_SITE_OTHER): Payer: Medicare HMO

## 2021-08-08 ENCOUNTER — Ambulatory Visit (INDEPENDENT_AMBULATORY_CARE_PROVIDER_SITE_OTHER): Payer: Medicare HMO | Admitting: Vascular Surgery

## 2021-08-23 ENCOUNTER — Inpatient Hospital Stay: Payer: Medicare HMO | Attending: Obstetrics and Gynecology | Admitting: Obstetrics and Gynecology

## 2021-08-23 VITALS — BP 123/63 | HR 76 | Resp 18 | Ht 68.0 in | Wt 228.0 lb

## 2021-08-23 DIAGNOSIS — R918 Other nonspecific abnormal finding of lung field: Secondary | ICD-10-CM | POA: Insufficient documentation

## 2021-08-23 DIAGNOSIS — Z08 Encounter for follow-up examination after completed treatment for malignant neoplasm: Secondary | ICD-10-CM

## 2021-08-23 DIAGNOSIS — Z8542 Personal history of malignant neoplasm of other parts of uterus: Secondary | ICD-10-CM | POA: Insufficient documentation

## 2021-08-23 DIAGNOSIS — C541 Malignant neoplasm of endometrium: Secondary | ICD-10-CM

## 2021-08-23 DIAGNOSIS — Z923 Personal history of irradiation: Secondary | ICD-10-CM | POA: Diagnosis not present

## 2021-08-23 NOTE — Patient Instructions (Signed)
Please call and make an appt at [facility] in 6 months. We will see you back in 1 year.

## 2021-08-23 NOTE — Progress Notes (Signed)
Patient has no questions or concerns today, here for a follow up.

## 2021-08-23 NOTE — Progress Notes (Signed)
[place]  Telephone:([phone] [phone]  Patient Care Team: Steele Sizer, MD as PCP - General (Family Medicine) Will Bonnet, MD as Attending Physician (Obstetrics and Gynecology) Noreene Filbert, MD as Referring Physician (Radiation Oncology) Yolonda Kida, MD as Consulting Physician (Cardiology) Derinda Sis, MD as Referring Physician (Internal Medicine)    Name of the patient: Melody Soto  [id number]  1950-11-21   Date of visit: 08/23/2021  Gynecologic Oncology Interval Visit   Referring Provider: Will Bonnet,  MD  Chief Concern: Grade 1 endometrial cancer  Subjective:  Melody Soto is a 71 y.o. female G2P2, initially seen in consultation for Dr. Glennon Mac for grade 1 endometrial cancer s/p primary radiation therapy, not a surgical candidate d/t cardiac disease, brachytherapy completed 04/02/17. She is s/p prior XL ovarian cystectomy for benign disease. She returns for follow up and continued surveillance.   No new complaints today. No bleeding or discharge.  Medical issues stable.  Gynecologic Oncology History Melody Soto is a pleasant female G2P2 s/p prior XL ovarian cystectomy for benign disease who is seen in consultation from Dr. Glennon Mac for grade 1 endometrial cancer.  10/17/2016 Endometrial biopsy and biopsy of cervical polyp  Part A: ENDOMETRIUM, BIOPSY:  WELL DIFFERENTIATED ENDOMETRIAL ADENOCARCINOMA (FIGO I). COMMENT: THIS CASE WAS REVIEWED IN INTRADEPARTMENTAL CONSULTATION AND THE DIAGNOSIS REFLECTS OUR CONSENSUS OPINION.DR,JACKSON'S OFFICE WAS INFORMED OF DIAGNOSIS ON 10/19/16 AT 1.10 PM.  Part B: ENDOCERVIX, POLYP:  MINUTE FRAGMENTS OFOF ADENOCARCINOMA PROBABLY FROM ENDOMETRIUM.  MINUTE FRAGMENTS OF BENIGN ENDOCERVICAL GLANDS, MUCUS, AND BLOOD.  Pap unsatisfactory  10/29/2017 Pelvic ultrasound Uterus 7.3 x 5 x 5 cm with small leiomyomas Bilateral ovaries normal. Endometrial  stripe 7.1 mm   CT C/A/P 11/20/2016 IMPRESSION: 1. There is a mildly enlarged right hilar lymph node at 1.1 cm, neck given the that there are no other indicators of metastatic disease, this is likely incidental. No worrisome pulmonary nodules identified. 2. The endometrium is indistinct. Uterine and adnexal contours relatively normal. 3. Asymmetric glandular tissues in the right upper breast compared to the left. Asymmetry is often normal, but mammographic correlation is suggested. 4. Other imaging findings of potential clinical significance: Aortic Atherosclerosis (ICD10-I70.0). Calcified aortic valve with faint calcification of the mitral valve. Cholelithiasis. Small type 1 hiatal hernia. Descending colon diverticulosis.  She was not a candidate for surgical management due to cardiac disease. She underwent primary radiation.    She underwent WPRT (45 Gy over 5 weeks) with Dr. Baruch Gouty at [facility] followed by vaginal cuff brachytherapy with Dr. Christel Mormon at [facility]. She completed high-dose rate Brachytherapy to the cervix (treatment 5 of planned 5) on 04/02/2017.  04/16/3017 Mammogram negative   05/09/3017 CT chest wo contrast IMPRESSION: 1. Stable small scattered mediastinal and hilar lymph nodes. No mass or adenopathy. No new or progressive findings. 2. Stable small pulmonary nodules. Recommend follow-up noncontrast chest CT in 1 year. 3. Stable mild emphysematous changes.  07/31/3017 CT Abdomen Pelvis W Contrast IMPRESSION: Negative. No evidence of recurrent or metastatic carcinoma within the abdomen or pelvis. Cholelithiasis.  No radiographic evidence of cholecystitis. Mild hepatic steatosis. Colonic diverticulosis, without radiographic evidence of diverticulitis.  03/2018 she had noticed some vaginal spotting and evaluation included CT scan, [place], and EMBX.   04/11/2018 CT C/A/P IMPRESSION: 1. Stable exam. No new or progressive finding since prior chest CT and abdomen/pelvis CT. 2. Multiple bilateral  pulmonary nodules measuring 5 mm less, stable in the 1 year interval since 05/09/2017 suggesting  benign etiology. Continued attention on follow-up recommended 3. Cholelithiasis. 4. Colonic diverticulosis without diverticulitis.  04/21/2018 Pelvic US Uterus: Measurements: 5.9 x 4.3 x 5.3 cm = volume: 69.5 mL. 11 x 10 x 18 mm subserosal fibroid in the anterior lower uterine segment. Endometrium Thickness: 8 mm.  No focal abnormality visualized.  Bilateral ovaries: No adnexal mass is seen.  04/23/2018   A. ENDOMETRIUM; BIOPSY:  - EXTREMELY SCANT BENIGN SQUAMOUS AND ENDOCERVICAL EPITHELIUM.  - NO ENDOMETRIAL TISSUE IS PRESENT.  We recommended repeat ultrasound and if EMS stable or increase we would repeat EMBx since the biopsy was not adequate for evaluation of the endometrial cavity.     06/17/2018 Pelvic Ultrasound FINDINGS: Uterus: Measurements: 6.3 x 3.8 x 5.0 cm = volume: 62.2 mL. 1.2 x 0.9 x 1.3 cm exophytic fibroid present at the level of the lower uterine segment.   Endometrium Thickness: 7.3 mm. No focal abnormality visualized. No increased vascularity.   Bilateral ovaries: Not visualized.  No adnexal mass.   Trace free fluid within the pelvis.  IMPRESSION: 1. Endometrial complex measures 7.3 mm in thickness without focalabnormality. 2. 1.3 cm fibroid arising from the lower uterine segment, stable.   [place] PEL 12/18/2018 Uterus: Measurements: 8.2 x 3.3 x 4.8 cm = volume: 67.7 mL. 1.3 x 0.9 x 1.4 cm subserosal fibroid again noted at the right lower uterine segment. Endometrium:Thickness: 5.4 mm. No focal abnormality visualized. No significantly increased vascularity. Right ovary: Not visualized.  No adnexal mass. Left ovary: Not visualized.  No adnexal mass. Other findingsNo abnormal free fluid.  IMPRESSION: 1. Endometrial complex measures 5.4 mm in thickness without focal abnormality. Continued ultrasound surveillance of treated endometrial carcinoma recommended as  clinically warranted. 2. 1.4 cm fibroid at the right lower uterine segment, stable.  10/27/2019 mammogram:  IMPRESSION: No mammographic evidence of malignancy.   12/10/2019 colonoscopy A. COLON POLYPS X2, CECUM; COLD SNARE:  - FRAGMENTS (X2) OF TUBULAR ADENOMAS.  - SINGLE FRAGMENT OF BENIGN COLONIC MUCOSA WITH SUPERFICIAL  SESSILE/HYPERPLASTIC CHANGES.  - FRAGMENTS (X2) OF BENIGN COLONIC MUCOSA WITH NO SIGNIFICANT  HISTOPATHOLOGIC CHANGE.  - NEGATIVE FOR HIGH-GRADE DYSPLASIA AND MALIGNANCY.   B. COLON POLYPS X4, ASCENDING (X1) AND TRANSVERSE (X3); COLD SNARE:  - FRAGMENTS (X5) OF TUBULAR ADENOMAS.  - SINGLE FRAGMENT OF SESSILE SERRATED POLYP.  - NEGATIVE FOR HIGH-GRADE DYSPLASIA AND MALIGNANCY.   C. COLON POLYP, SIGMOID; COLD BIOPSY:  - POLYPOID FRAGMENT OF BENIGN COLONIC MUCOSA WITH UNDERLYING MATURE  ADIPOSE TISSUE, COMPATIBLE WITH SUBMUCOSAL LIPOMA.  - NEGATIVE FOR DYSPLASIA AND MALIGNANCY.   D. COLON, SIGMOID, NODULAR MUCOSA; COLD BIOPSY:  - BENIGN COLONIC MUCOSA WITH SUPERFICIAL REACTIVE CHANGES.  - NEGATIVE FOR FEATURES OF MICROSCOPIC COLITIS.  - NEGATIVE FOR DYSPLASIA AND MALIGNANCY.   08/10/2019 Chest CT IMPRESSION: 1. Multiple tiny pulmonary nodules scattered throughout the lungs bilaterally measuring 4 mm or less in size, stable compared to prior examinations, considered definitively benign. No findings to suggest metastatic disease in the thorax. 2. Aortic atherosclerosis, in addition to 2 vessel coronary artery disease. Please note that although the presence of coronary artery calcium documents the presence of coronary artery disease, the severity of this disease and any potential stenosis cannot be assessed on this non-gated CT examination. Assessment for potential risk factor modification, dietary therapy or pharmacologic therapy may be warranted, if clinically indicated. 3. Ectasia of ascending thoracic aorta (4.0 cm in diameter). Recommend annual imaging followup by  CTA or MRA. This recommendation follows 2010 ACCF/AHA/AATS/ACR/ASA/SCA/SCAI/SIR/STS/SVM Guidelines for the Diagnosis and Management  of Patients with Thoracic Aortic Disease. Circulation. 2010; 121: W299-B716. Aortic aneurysm NOS (ICD10-I71.9). 4. Status post TAVR.  We shared this information with Dr. Ancil Boozer and patient is now followed by vascular surgeon.   7/22 Chest CT IMPRESSION: 1. Multiple tiny pulmonary nodules scattered throughout the lungs bilaterally measuring 4 mm or less in size, stable compared to prior examinations, considered definitively benign. No findings to suggest metastatic disease in the thorax. 2. Aortic atherosclerosis, in addition to 2 vessel coronary artery disease. Please note that although the presence of coronary artery calcium documents the presence of coronary artery disease, the severity of this disease and any potential stenosis cannot be assessed on this non-gated CT examination. Assessment for potential risk factor modification, dietary therapy or pharmacologic therapy may be warranted, if clinically indicated. 3. Ectasia of ascending thoracic aorta (4.0 cm in diameter). Recommend annual imaging followup by CTA or MRA. This recommendation follows 2010 ACCF/AHA/AATS/ACR/ASA/SCA/SCAI/SIR/STS/SVM Guidelines for the Diagnosis and Management of Patients with Thoracic Aortic Disease. Circulation. 2010; 121: R678-L381. Aortic aneurysm NOS (ICD10-I71.9). 4. Status post TAVR.   Problem List: Patient Active Problem List   Diagnosis Date Noted   Hypercalcemia 07/28/2021   Chronic combined systolic and diastolic congestive heart failure ([place]) 07/28/2021   History of colon polyps 07/28/2021   Elevated hematocrit 07/28/2021   Vitamin D deficiency 07/28/2021   Hyperglycemia 07/28/2021   Skin lesion 07/28/2021   Mild major depression ([place]) 12/23/2020   Obesity (BMI 30.0-34.9) 12/23/2020   Thoracic aorta atherosclerosis ([place]) 12/23/2020   Subclavian vein  occlusion, right ([place]) 10/04/2020   Paroxysmal atrial fibrillation ([place]) 10/02/2019   Diverticulosis of colon 08/08/2018   Cholelithiasis 08/08/2018   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   Chronic acquired lymphedema 01/08/2017   FIGO stage II endometrial cancer ([place]) 10/29/2016   Left ventricular hypertrophy 11/27/2014   Benign essential HTN 08/21/2014   Dyslipidemia 08/21/2014   Cardiac dysrhythmia 01/75/1025   Dysmetabolic syndrome 85/27/7824   Female genuine stress incontinence 08/21/2014   History of shingles 08/21/2014   Personal history of skin cancer 11/24/2009    Past Medical History: Past Medical History:  Diagnosis Date   Aortic stenosis    Cataract    Depression    Dysrhythmia    Endometrial cancer determined by uterine biopsy ([place]) 10/29/2016   Rad tx's and internal brachytherapy.   Heart murmur    HX OF   Hyperlipidemia    Hypertension    CONTROLLED ON MEDS   Lymphedema    Shortness of breath dyspnea     Past Surgical History: Past Surgical History:  Procedure Laterality Date   AORTIC VALVE REPLACEMENT Bilateral 02/27/2017   CARDIAC CATHETERIZATION     CARDIAC VALVE REPLACEMENT  02/27/2017   CARDIOVERSION  10/2019   cataract surgery Bilateral 05/24/2014   second eye 06/07/2014   COLONOSCOPY WITH PROPOFOL N/A 09/24/2019   Procedure: COLONOSCOPY WITH PROPOFOL;  Surgeon: [name], MD;  Location: [facility] ENDOSCOPY;  Service: Gastroenterology;  Laterality: N/A;   COLONOSCOPY WITH PROPOFOL N/A 12/10/2019   Procedure: COLONOSCOPY WITH PROPOFOL;  Surgeon: [name], MD;  Location: [facility] ENDOSCOPY;  Service: Endoscopy;  Laterality: N/A;   CORONARY ARTERY BYPASS GRAFT     EYE SURGERY     MELANOMA EXCISION     OVARIAN CYST REMOVAL     EXPLORATORY LAPAROTOMY   TONSILLECTOMY      Past Gynecologic History:  Last Menstrual Period:  2016; postmenopausal Last pap: 2018   OB History:  OB  History  Gravida Para Term  Preterm AB Living  '7 2 2   5 2  '$ SAB IAB Ectopic Multiple Live Births  5       2    # Outcome Date GA Lbr Len/2nd Weight Sex Delivery Anes PTL Lv  7 Term 03/30/92 [redacted]w[redacted]d  F Vag-Spont   LIV  6 Term 04/30/86 365w0d F Vag-Spont   LIV  5 SAB           4 SAB           3 SAB           2 SAB           1 SAB             Obstetric Comments  7 pregancies; 2 NSVDs    Family History: Family History  Problem Relation Age of Onset   Stroke Mother    Thyroid nodules Mother    Lung cancer Mother    Cancer Mother    Hypertension Mother    Diabetes Sister    Hypertension Sister    Aortic aneurysm Father    Early death Father    Heart disease Father    Obesity Father    Stroke Father    Breast cancer Maternal Aunt    Bone cancer Maternal Uncle    Depression Daughter    Anxiety disorder Daughter    Depression Daughter     Social History: Social History   Socioeconomic History   Marital status: Divorced    Spouse name: Not on file   Number of children: 2   Years of education: Not on file   Highest education level: Bachelor's degree (e.g., BA, AB, BS)  Occupational History   Occupation: caregiver    Employer: [employer]    Comment: caregiver  Tobacco Use   Smoking status: Never   Smokeless tobacco: Never  Vaping Use   Vaping Use: Never used  Substance and Sexual Activity   Alcohol use: Not Currently   Drug use: No   Sexual activity: Not Currently    Birth control/protection: Abstinence, Post-menopausal  Other Topics Concern   Not on file  Social History Narrative   She is divorced   She has two daughters   The youngest is not currently talking to her since 07/2018. She got pregnant without being married and had the baby in October.    Social Determinants of Health   Financial Resource Strain: Medium Risk (08/25/2020)   Overall Financial Resource Strain (CARDIA)    Difficulty of Paying Living Expenses: Somewhat hard  Food Insecurity: No Food Insecurity  (08/25/2020)   Hunger Vital Sign    Worried About Running Out of Food in the Last Year: Never true    Ran Out of Food in the Last Year: Never true  Transportation Needs: No Transportation Needs (08/25/2020)   PRAPARE - TrHydrologistMedical): No    Lack of Transportation (Non-Medical): No  Physical Activity: Inactive (08/25/2020)   Exercise Vital Sign    Days of Exercise per Week: 0 days    Minutes of Exercise per Session: 0 min  Stress: No Stress Concern Present (08/25/2020)   [place]  Feeling of Stress : Only a little  Social Connections: Moderately Integrated (08/25/2020)   Social Connection and Isolation Panel [NHANES]    Frequency of Communication with Friends and  Family: More than three times a week    Frequency of Social Gatherings with Friends and Family: Three times a week    Attends Religious Services: More than 4 times per year    Active Member of Clubs or Organizations: Yes    Attends Archivist Meetings: More than 4 times per year    Marital Status: Divorced  Intimate Partner Violence: Not At Risk (08/25/2020)   Humiliation, Afraid, Rape, and Kick questionnaire    Fear of Current or Ex-Partner: No    Emotionally Abused: No    Physically Abused: No    Sexually Abused: No    Allergies: Allergies  Allergen Reactions   Ace Inhibitors Other (See Comments)    UNKNOWN REACTION   Duloxetine Other (See Comments)    Drowsiness    Current Medications: Current Outpatient Medications  Medication Sig Dispense Refill   apixaban (ELIQUIS) 5 MG TABS tablet Take 5 mg by mouth 2 (two) times daily.     Ascorbic Acid (VITAMIN C) 1000 MG tablet Take 1,000 mg by mouth in the morning and at bedtime.     atorvastatin (LIPITOR) 40 MG tablet Take 1 tablet (40 mg total) by mouth 2 (two) times a week. 24 tablet 1   B Complex Vitamins (B COMPLEX 1 PO) Take by mouth.     dofetilide  (TIKOSYN) 125 MCG capsule Take 125 mcg by mouth 2 (two) times daily.     JARDIANCE 10 MG TABS tablet Take 10 mg by mouth daily.     Magnesium 500 MG CAPS Take 800 mg by mouth in the morning and at bedtime.      metoprolol succinate (TOPROL-XL) 50 MG 24 hr tablet Take 50 mg by mouth 2 (two) times daily.     Omega-3 Fatty Acids (FISH OIL) 1000 MG CAPS Take 1 capsule by mouth daily.     spironolactone (ALDACTONE) 25 MG tablet Take 25 mg by mouth 2 (two) times daily.     telmisartan (MICARDIS) 40 MG tablet Take 1 tablet (40 mg total) by mouth daily. 90 tablet 1   Vitamin D-Vitamin K (VITAMIN K2-VITAMIN D3 PO) Take by mouth in the morning and at bedtime.     vitamin E 200 UNIT capsule Take 200 Units by mouth 2 (two) times daily.     zinc gluconate 50 MG tablet Take 50 mg by mouth daily.     No current facility-administered medications for this visit.   Review of Systems General:  no complaints Skin: no complaints Eyes: no complaints HEENT: no complaints Breasts: no complaints Pulmonary: no complaints Cardiac: no complaints Gastrointestinal: no complaints Genitourinary/Sexual: no complaints Ob/Gyn: no complaints Musculoskeletal: no complaints Hematology: no complaints Neurologic/Psych: no complaints   Objective:  Physical Examination:   BP 123/63 (BP Location: Left Arm, Patient Position: Sitting)   Pulse 76   Resp 18   Ht '5\' 8"'$  (1.727 m)   Wt 228 lb (103.4 kg)   BMI 34.67 kg/m   ECOG Performance Status: 0  GENERAL: Patient is a well appearing female in no acute distress HEENT:  Sclera clear. Anicteric NODES:  Negative axillary, supraclavicular, inguinal lymph node survery LUNGS:  Clear to auscultation bilaterally.   HEART:  Regular rate and rhythm.  ABDOMEN:  Soft, nontender.  No hernias, incisions well healed. No masses or ascites EXTREMITIES:  3+ peripheral edema and wearing lower leg compression stockings. Atraumatic. No cyanosis SKIN:  Clear with no obvious rashes or  skin changes.  NEURO:  Nonfocal. Well  oriented.  Appropriate affect.  Pelvic: Exam chaperoned by nursing. Patient provided consent.  EGBUS: no lesions.  Cervix: no lesions, nontender, mobile.  Vagina: no lesions, no discharge or bleeding. Radiation atrophy. Uterus: not enlarged, nontender, mobile, exam limited by habitus.  Adnexa: no palpable masses.  Rectovaginal: deferred.     Assessment:  Melody Soto is a 71 y.o. female diagnosed with possible stage II grade 1 endometrial cancer with involvement of a cervical polyp s/p primary radiation therapy. NED on exam.   Prior history of spotting - Imaging unremarkable other than thickened EMS. EMBx obtained with scant tissue, repeat imaging decreased endometrial stripe 06/2018 and no further bleeding episodes. Repeat US decreased EMS and no further symptoms. Continue to follow.    Stable small pulmonary nodules on imaging - repeat CT chest 03/2018 stable. Follow CT chest    Enlarged hilar node - no evidence of progression on imaging.   Asymmetric breast tissue - negative mammogram, recommended she follow up with her screening mammogram which was negative.   S/P TAVR (transcatheter aortic valve replacement)  Ectasia of ascending thoracic aorta.  Hyperplastic polyp on colonoscopy.   LE lymphedema  Medical co-morbidities complicating care:  left ventricular hypertrophy, severe aortic stenosis, cardiac dysrhythmia, hyperlipidemia,, HTN and obesity.    Plan:   Encounter for follow-up surveillance of endometrial cancer  Endometrial cancer ([place])  FIGO stage II endometrial cancer ([place])  We will alternate visits with [facility]. We discussed that she will need to reach out to them to schedule an appt to be seen in 6 months. We provided an appt for her to see [place] in 1 year.   With regard to pulmonary nodules repeat Chest CT 7/22 stable. She follows up with cardiologist at [facility].   Hyperplastic polyp on colonoscopy. She stated she  was not aware of this results. She believes she is supposed to have another coloscopy in 2 years. We also asked her to follow up with Dr. Ancil Boozer about this issue and the hyperplastic polyp.   We discussed weight loss and exercise. She is following a keto diet and I advised against this diet given some of the concerning data in animal cancer models. We reviewed the option of doing chair exercises and the exercise recommendations for 5 days a week for 45 minutes.   Continue routine health care and screening.   Beckey Rutter, DNP, AGNP-C [place] at [facility] [phone] (clinic)  I personally interviewed and examined the patient. Agreed with the above/below plan of care. I have directly contributed to assessment and plan of care of this patient and educated and discussed with patient and family.  Mellody Drown, MD

## 2021-09-03 ENCOUNTER — Encounter: Payer: Self-pay | Admitting: Family Medicine

## 2021-09-05 ENCOUNTER — Ambulatory Visit: Payer: Medicare HMO

## 2021-09-12 ENCOUNTER — Ambulatory Visit (INDEPENDENT_AMBULATORY_CARE_PROVIDER_SITE_OTHER): Payer: Medicare HMO

## 2021-09-12 VITALS — Ht 68.0 in | Wt 228.0 lb

## 2021-09-12 DIAGNOSIS — Z0000 Encounter for general adult medical examination without abnormal findings: Secondary | ICD-10-CM | POA: Diagnosis not present

## 2021-09-12 DIAGNOSIS — M810 Age-related osteoporosis without current pathological fracture: Secondary | ICD-10-CM

## 2021-09-12 NOTE — Progress Notes (Signed)
Subjective:  I connected with  Melody Soto on 09/12/21 by a audio enabled telemedicine application and verified that I am speaking with the correct person using two identifiers.  Patient Location: Home  Provider Location: Clinic  I discussed the limitations of evaluation and management by telemedicine. The patient expressed understanding and agreed to proceed.  Melody Soto is a 71 y.o. female who presents for Medicare Annual (Subsequent) preventive examination.  Review of Systems    Defer to PCP       Objective:    There were no vitals filed for this visit. There is no height or weight on file to calculate BMI.     08/25/2020   10:23 AM 01/29/2020    1:06 PM 01/28/2020   10:36 AM 12/10/2019    8:54 AM 09/24/2019    9:00 AM 07/29/2019    2:54 PM 05/01/2019   10:47 AM  Advanced Directives  Does Patient Have a Medical Advance Directive? No No No No No Yes No  Type of Advance Directive      [place] in Chart?      No - copy requested   Would patient like information on creating a medical advance directive? No - Patient declined No - Patient declined No - Patient declined    No - Patient declined    Current Medications (verified) Outpatient Encounter Medications as of 09/12/2021  Medication Sig   apixaban (ELIQUIS) 5 MG TABS tablet Take 5 mg by mouth 2 (two) times daily.   Ascorbic Acid (VITAMIN C) 1000 MG tablet Take 1,000 mg by mouth in the morning and at bedtime.   atorvastatin (LIPITOR) 40 MG tablet Take 1 tablet (40 mg total) by mouth 2 (two) times a week.   B Complex Vitamins (B COMPLEX 1 PO) Take by mouth.   dofetilide (TIKOSYN) 125 MCG capsule Take 125 mcg by mouth 2 (two) times daily.   JARDIANCE 10 MG TABS tablet Take 10 mg by mouth daily.   Magnesium 500 MG CAPS Take 800 mg by mouth in the morning and at bedtime.    metoprolol succinate (TOPROL-XL) 50 MG 24 hr tablet Take 50 mg by mouth 2 (two)  times daily.   Omega-3 Fatty Acids (FISH OIL) 1000 MG CAPS Take 1 capsule by mouth daily.   spironolactone (ALDACTONE) 25 MG tablet Take 25 mg by mouth 2 (two) times daily.   telmisartan (MICARDIS) 40 MG tablet Take 1 tablet (40 mg total) by mouth daily.   Vitamin D-Vitamin K (VITAMIN K2-VITAMIN D3 PO) Take by mouth in the morning and at bedtime.   vitamin E 200 UNIT capsule Take 200 Units by mouth 2 (two) times daily.   zinc gluconate 50 MG tablet Take 50 mg by mouth daily.   No facility-administered encounter medications on file as of 09/12/2021.    Allergies (verified) Ace inhibitors and Duloxetine   History: Past Medical History:  Diagnosis Date   Aortic stenosis    Cataract    Depression    Dysrhythmia    Endometrial cancer determined by uterine biopsy ([place]) 10/29/2016   Rad tx's and internal brachytherapy.   Heart murmur    HX OF   Hyperlipidemia    Hypertension    CONTROLLED ON MEDS   Lymphedema    Shortness of breath dyspnea    Past Surgical History:  Procedure Laterality Date   AORTIC VALVE REPLACEMENT Bilateral 02/27/2017   CARDIAC  CATHETERIZATION     CARDIAC VALVE REPLACEMENT  02/27/2017   CARDIOVERSION  10/2019   cataract surgery Bilateral 05/24/2014   second eye 06/07/2014   COLONOSCOPY WITH PROPOFOL N/A 09/24/2019   Procedure: COLONOSCOPY WITH PROPOFOL;  Surgeon: [name], MD;  Location: [facility] ENDOSCOPY;  Service: Gastroenterology;  Laterality: N/A;   COLONOSCOPY WITH PROPOFOL N/A 12/10/2019   Procedure: COLONOSCOPY WITH PROPOFOL;  Surgeon: [name], MD;  Location: [facility] ENDOSCOPY;  Service: Endoscopy;  Laterality: N/A;   CORONARY ARTERY BYPASS GRAFT     EYE SURGERY     MELANOMA EXCISION     OVARIAN CYST REMOVAL     EXPLORATORY LAPAROTOMY   TONSILLECTOMY     Family History  Problem Relation Age of Onset   Stroke Mother    Thyroid nodules Mother    Lung cancer Mother    Cancer Mother    Hypertension Mother    Diabetes Sister     Hypertension Sister    Aortic aneurysm Father    Early death Father    Heart disease Father    Obesity Father    Stroke Father    Breast cancer Maternal Aunt    Bone cancer Maternal Uncle    Depression Daughter    Anxiety disorder Daughter    Depression Daughter    Social History   Socioeconomic History   Marital status: Divorced    Spouse name: Not on file   Number of children: 2   Years of education: Not on file   Highest education level: Bachelor's degree (e.g., BA, AB, BS)  Occupational History   Occupation: caregiver    Employer: [employer]    Comment: caregiver  Tobacco Use   Smoking status: Never   Smokeless tobacco: Never  Vaping Use   Vaping Use: Never used  Substance and Sexual Activity   Alcohol use: Not Currently   Drug use: No   Sexual activity: Not Currently    Birth control/protection: Abstinence, Post-menopausal  Other Topics Concern   Not on file  Social History Narrative   She is divorced   She has two daughters   The youngest is not currently talking to her since 07/2018. She got pregnant without being married and had the baby in October.    Social Determinants of Health   Financial Resource Strain: Medium Risk (08/25/2020)   Overall Financial Resource Strain (CARDIA)    Difficulty of Paying Living Expenses: Somewhat hard  Food Insecurity: No Food Insecurity (08/25/2020)   Hunger Vital Sign    Worried About Running Out of Food in the Last Year: Never true    Ran Out of Food in the Last Year: Never true  Transportation Needs: No Transportation Needs (08/25/2020)   PRAPARE - Hydrologist (Medical): No    Lack of Transportation (Non-Medical): No  Physical Activity: Inactive (08/25/2020)   Exercise Vital Sign    Days of Exercise per Week: 0 days    Minutes of Exercise per Session: 0 min  Stress: No Stress Concern Present (08/25/2020)   [place]    Feeling of Stress : Only a little  Social Connections: Moderately Integrated (08/25/2020)   Social Connection and Isolation Panel [NHANES]    Frequency of Communication with Friends and Family: More than three times a week    Frequency of Social Gatherings with Friends and Family: Three times a week    Attends Religious  Services: More than 4 times per year    Active Member of Clubs or Organizations: Yes    Attends Archivist Meetings: More than 4 times per year    Marital Status: Divorced    Tobacco Counseling Counseling given: Not Answered   Clinical Intake:                 Diabetic?N/A         Activities of Daily Living    07/28/2021   10:31 AM 12/23/2020   10:59 AM  In your present state of health, do you have any difficulty performing the following activities:  Hearing? 0 0  Vision? 0 0  Difficulty concentrating or making decisions? 0 0  Walking or climbing stairs? 0 0  Dressing or bathing? 0 0  Doing errands, shopping? 0 0    Patient Care Team: Steele Sizer, MD as PCP - General (Family Medicine) Will Bonnet, MD as Attending Physician (Obstetrics and Gynecology) Noreene Filbert, MD as Referring Physician (Radiation Oncology) Yolonda Kida, MD as Consulting Physician (Cardiology) Derinda Sis, MD as Referring Physician (Internal Medicine)  Indicate any recent Medical Services you may have received from other than [facility] providers in the past year (date may be approximate).     Assessment:   This is a routine wellness examination for Melody Soto.  Hearing/Vision screen No results found.  Dietary issues and exercise activities discussed:     Goals Addressed   None   Depression Screen    07/28/2021   10:31 AM 12/23/2020   10:58 AM 08/25/2020   10:21 AM 06/21/2020    1:23 PM 12/21/2019    1:57 PM 12/21/2019    1:55 PM 08/11/2019    2:09 PM  PHQ 2/9 Scores  PHQ - 2 Score 1 0 0 0 0 0 0  PHQ- 9 Score 2 0  0 1       Fall Risk    07/28/2021   10:31 AM 12/23/2020   10:58 AM 08/25/2020   10:25 AM 06/21/2020    1:22 PM 12/21/2019    1:55 PM  Fall Risk   Falls in the past year? 0 0 0 0 0  Number falls in past yr: 0 0 0 0 0  Injury with Fall? 0 0 0 0 0  Risk for fall due to : No Fall Risks No Fall Risks No Fall Risks    Follow up Falls prevention discussed Falls prevention discussed Falls prevention discussed      FALL RISK PREVENTION PERTAINING TO THE HOME:  Any stairs in or around the home? Yes  If so, are there any without handrails? Yes  Home free of loose throw rugs in walkways, pet beds, electrical cords, etc? Yes  Adequate lighting in your home to reduce risk of falls? Yes   ASSISTIVE DEVICES UTILIZED TO PREVENT FALLS:  Life alert? No  Use of a cane, walker or w/c? No  Grab bars in the bathroom? No  Shower chair or bench in shower? No  Elevated toilet seat or a handicapped toilet? No   TIMED UP AND GO:  Was the test performed? No .  Length of time to ambulate 10 feet: N/A sec.     Cognitive Function:        08/05/2018    2:23 PM  6CIT Screen  What Year? 0 points  What month? 0 points  What time? 0 points  Count back from 20 0 points  Months in reverse 0  points  Repeat phrase 0 points  Total Score 0 points    Immunizations Immunization History  Administered Date(s) Administered   H1N1 02/25/2017   Influenza, High Dose Seasonal PF 01/29/2018   Influenza,inj,Quad PF,6+ Mos 11/24/2015   Influenza-Unspecified 11/13/2010   Pneumococcal Conjugate-13 08/08/2018   Pneumococcal Polysaccharide-23 03/21/2017   Tdap 07/14/2013   Zoster, Live 11/24/2015    TDAP status: Up to date  Flu Vaccine status: Up to date  Pneumococcal vaccine status: Declined,  Education has been provided regarding the importance of this vaccine but patient still declined. Advised may receive this vaccine at local pharmacy or Health Dept. Aware to provide a copy of the vaccination record if  obtained from local pharmacy or Health Dept. Verbalized acceptance and understanding.   Covid-19 vaccine status: Completed vaccines  Qualifies for Shingles Vaccine? No   Zostavax completed No   Shingrix Completed?: No.    Education has been provided regarding the importance of this vaccine. Patient has been advised to call insurance company to determine out of pocket expense if they have not yet received this vaccine. Advised may also receive vaccine at local pharmacy or Health Dept. Verbalized acceptance and understanding.  Screening Tests Health Maintenance  Topic Date Due   COVID-19 Vaccine (1) Never done   Zoster Vaccines- Shingrix (1 of 2) Never done   INFLUENZA VACCINE  09/12/2021   COLONOSCOPY (Pts 45-85yr Insurance coverage will need to be confirmed)  12/10/2022   MAMMOGRAM  04/07/2023   TETANUS/TDAP  07/15/2023   Pneumonia Vaccine 70 Years old  Completed   DEXA SCAN  Completed   Hepatitis C Screening  Completed   HPV VACCINES  Aged Out   Fecal DNA (Cologuard)  Discontinued    Health Maintenance  Health Maintenance Due  Topic Date Due   COVID-19 Vaccine (1) Never done   Zoster Vaccines- Shingrix (1 of 2) Never done   INFLUENZA VACCINE  09/12/2021    Colorectal cancer screening: Type of screening: Colonoscopy. Completed 12/10/2019. Repeat every 3 years  Mammogram status: Completed 04/06/2021. Repeat every year 2   Bone Density status: Completed 08/20/2017. Results reflect: Bone density results: OSTEOPOROSIS. Repeat every 2 years. New order placed today.   Lung Cancer Screening: (Low Dose CT Chest recommended if Age 70-80years, 30 pack-year currently smoking OR have quit w/in 15years.) does not qualify.   Lung Cancer Screening Referral: N/A  Additional Screening:  Hepatitis C Screening: does not qualify; Completed 06/24/2017  Vision Screening: Recommended annual ophthalmology exams for early detection of glaucoma and other disorders of the eye. Is the patient  up to date with their annual eye exam?  No  Who is the provider or what is the name of the office in which the patient attends annual eye exams? Dr. RMarvel Plan(Patient will call to schedule)  If pt is not established with a provider, would they like to be referred to a provider to establish care?  N/A .   Dental Screening: Recommended annual dental exams for proper oral hygiene  Community Resource Referral / Chronic Care Management: CRR required this visit?  No   CCM required this visit?  No      Plan:     I have personally reviewed and noted the following in the patient's chart:   Medical and social history Use of alcohol, tobacco or illicit drugs  Current medications and supplements including opioid prescriptions.  Functional ability and status Nutritional status Physical activity Advanced directives List of other physicians Hospitalizations, surgeries,  and ER visits in previous 12 months Vitals Screenings to include cognitive, depression, and falls Referrals and appointments  In addition, I have reviewed and discussed with patient certain preventive protocols, quality metrics, and best practice recommendations. A written personalized care plan for preventive services as well as general preventive health recommendations were provided to patient.     Royal Hawthorn, [place]   09/12/2021   Nurse Notes: Non-Face to Face 23 minutes s[ent.  Ms. Rennaker , Thank you for taking time to come for your Medicare Wellness Visit. I appreciate your ongoing commitment to your health goals. Please review the following plan we discussed and let me know if I can assist you in the future.   These are the goals we discussed:  Goals      Increase physical activity     Recommend increasing physical activity to at least 3 days per week      Weight (lb) < 250 lb (113.4 kg)     Pt would like to lose weight over the next year with healthy eating and increasing physical activity        This  is a list of the screening recommended for you and due dates:  Health Maintenance  Topic Date Due   COVID-19 Vaccine (1) 09/28/2021*   Zoster (Shingles) Vaccine (1 of 2) 12/13/2021*   Flu Shot  05/13/2022*   Colon Cancer Screening  12/10/2022   Mammogram  04/07/2023   Tetanus Vaccine  07/15/2023   Pneumonia Vaccine  Completed   DEXA scan (bone density measurement)  Completed   Hepatitis C Screening: USPSTF Recommendation to screen - Ages 48-79 yo.  Completed   HPV Vaccine  Aged Out   Cologuard (Stool DNA test)  Discontinued  *Topic was postponed. The date shown is not the original due date.

## 2021-10-24 ENCOUNTER — Telehealth: Payer: Self-pay | Admitting: Family Medicine

## 2021-10-24 NOTE — Telephone Encounter (Signed)
I left a VM for pt to call back to confirm if appt was made in error or not

## 2021-10-24 NOTE — Telephone Encounter (Signed)
Lvm to confirm about her appt     Copied from [place] [id number]. Topic: General - Other >> Oct 24, 2021 10:39 AM Ludger Nutting wrote: Melody Soto with [employer] said that they will cover a 100 day supply of telmisartan (MICARDIS) 40 MG tablet and atorvastatin (LIPITOR) 40 MG tablet. Patient would like them filled in those quantities if possible. Please advise patient.

## 2021-11-27 ENCOUNTER — Telehealth: Payer: Self-pay | Admitting: Family Medicine

## 2021-11-27 DIAGNOSIS — I890 Lymphedema, not elsewhere classified: Secondary | ICD-10-CM | POA: Diagnosis not present

## 2021-11-27 DIAGNOSIS — Z952 Presence of prosthetic heart valve: Secondary | ICD-10-CM | POA: Diagnosis not present

## 2021-11-27 DIAGNOSIS — I708 Atherosclerosis of other arteries: Secondary | ICD-10-CM | POA: Diagnosis not present

## 2021-11-27 DIAGNOSIS — R519 Headache, unspecified: Secondary | ICD-10-CM | POA: Diagnosis not present

## 2021-11-27 DIAGNOSIS — Z8679 Personal history of other diseases of the circulatory system: Secondary | ICD-10-CM | POA: Diagnosis not present

## 2021-11-27 DIAGNOSIS — E669 Obesity, unspecified: Secondary | ICD-10-CM | POA: Diagnosis not present

## 2021-11-27 DIAGNOSIS — R5382 Chronic fatigue, unspecified: Secondary | ICD-10-CM | POA: Diagnosis not present

## 2021-11-27 DIAGNOSIS — R42 Dizziness and giddiness: Secondary | ICD-10-CM | POA: Diagnosis not present

## 2021-11-27 DIAGNOSIS — I480 Paroxysmal atrial fibrillation: Secondary | ICD-10-CM | POA: Diagnosis not present

## 2021-11-27 DIAGNOSIS — E782 Mixed hyperlipidemia: Secondary | ICD-10-CM | POA: Diagnosis not present

## 2021-11-27 DIAGNOSIS — I10 Essential (primary) hypertension: Secondary | ICD-10-CM | POA: Diagnosis not present

## 2021-11-27 DIAGNOSIS — I209 Angina pectoris, unspecified: Secondary | ICD-10-CM | POA: Diagnosis not present

## 2021-11-27 NOTE — Telephone Encounter (Signed)
Patient states cardiology would like her to retake her labs at PCP office. Patient unsure what labs cardiology would like her to retake.

## 2021-11-29 DIAGNOSIS — I480 Paroxysmal atrial fibrillation: Secondary | ICD-10-CM | POA: Diagnosis not present

## 2021-11-29 DIAGNOSIS — Z952 Presence of prosthetic heart valve: Secondary | ICD-10-CM | POA: Diagnosis not present

## 2021-11-29 NOTE — Progress Notes (Signed)
Name: Melody Soto   MRN: [id number]    DOB: 02/08/1951   Date:12/01/2021       Progress Note  Subjective  Chief Complaint  Cardiology/Lab Follow Up  HPI  Leucocytosis: she states on 09/22 she had 12 hours of irregular heart beat, she contacted cardiologist and by the time she was seen palpitations was gone, and she decided to wait until her regular follow up that was on 11/27/2021  She continues to feel tired, does not feel like herself,  She has not been able to go to work due to significant fatigue. She denies SOB, no chest pain, no cough, denies dysuria but states urine was concentrate last week.   . She denies cold symptoms and had a negative COVID-19 test. She had a few days of low grade fever about one month ago. She is concerned about elevation of white count found on CBC done by cardiologist.    Patient Active Problem List   Diagnosis Date Noted   Hypercalcemia 07/28/2021   Chronic combined systolic and diastolic congestive heart failure ([place]) 07/28/2021   History of colon polyps 07/28/2021   Elevated hematocrit 07/28/2021   Vitamin D deficiency 07/28/2021   Hyperglycemia 07/28/2021   Skin lesion 07/28/2021   Mild major depression ([place]) 12/23/2020   Obesity (BMI 30.0-34.9) 12/23/2020   Thoracic aorta atherosclerosis ([place]) 12/23/2020   Subclavian vein occlusion, right ([place]) 10/04/2020   Paroxysmal atrial fibrillation ([place]) 10/02/2019   Diverticulosis of colon 08/08/2018   Cholelithiasis 08/08/2018   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   Chronic acquired lymphedema 01/08/2017   FIGO stage II endometrial cancer ([place]) 10/29/2016   Left ventricular hypertrophy 11/27/2014   Benign essential HTN 08/21/2014   Dyslipidemia 08/21/2014   Cardiac dysrhythmia 40/98/1191   Dysmetabolic syndrome 47/82/9562   Female genuine stress incontinence 08/21/2014   History of shingles 08/21/2014   Personal history of skin cancer 11/24/2009    Past Surgical  History:  Procedure Laterality Date   AORTIC VALVE REPLACEMENT Bilateral 02/27/2017   CARDIAC CATHETERIZATION     CARDIAC VALVE REPLACEMENT  02/27/2017   CARDIOVERSION  10/2019   cataract surgery Bilateral 05/24/2014   second eye 06/07/2014   COLONOSCOPY WITH PROPOFOL N/A 09/24/2019   Procedure: COLONOSCOPY WITH PROPOFOL;  Surgeon: [name], MD;  Location: [facility] ENDOSCOPY;  Service: Gastroenterology;  Laterality: N/A;   COLONOSCOPY WITH PROPOFOL N/A 12/10/2019   Procedure: COLONOSCOPY WITH PROPOFOL;  Surgeon: [name], MD;  Location: [facility] ENDOSCOPY;  Service: Endoscopy;  Laterality: N/A;   CORONARY ARTERY BYPASS GRAFT     EYE SURGERY     MELANOMA EXCISION     OVARIAN CYST REMOVAL     EXPLORATORY LAPAROTOMY   TONSILLECTOMY      Family History  Problem Relation Age of Onset   Stroke Mother    Thyroid nodules Mother    Lung cancer Mother    Cancer Mother    Hypertension Mother    Diabetes Sister    Hypertension Sister    Aortic aneurysm Father    Early death Father    Heart disease Father    Obesity Father    Stroke Father    Breast cancer Maternal Aunt    Bone cancer Maternal Uncle    Depression Daughter    Anxiety disorder Daughter    Depression Daughter     Social History   Tobacco Use   Smoking status: Never   Smokeless tobacco: Never  Substance Use Topics  Alcohol use: Not Currently     Current Outpatient Medications:    apixaban (ELIQUIS) 5 MG TABS tablet, Take 5 mg by mouth 2 (two) times daily., Disp: , Rfl:    Ascorbic Acid (VITAMIN C) 1000 MG tablet, Take 1,000 mg by mouth in the morning and at bedtime., Disp: , Rfl:    atorvastatin (LIPITOR) 40 MG tablet, Take 1 tablet (40 mg total) by mouth 2 (two) times a week., Disp: 24 tablet, Rfl: 1   dofetilide (TIKOSYN) 125 MCG capsule, Take 125 mcg by mouth 2 (two) times daily., Disp: , Rfl:    JARDIANCE 10 MG TABS tablet, Take 10 mg by mouth daily., Disp: , Rfl:    Magnesium 500 MG CAPS,  Take 800 mg by mouth in the morning and at bedtime. , Disp: , Rfl:    metoprolol succinate (TOPROL-XL) 50 MG 24 hr tablet, Take 50 mg by mouth 2 (two) times daily., Disp: , Rfl:    Omega-3 Fatty Acids (FISH OIL) 1000 MG CAPS, Take 1 capsule by mouth daily., Disp: , Rfl:    spironolactone (ALDACTONE) 25 MG tablet, Take 25 mg by mouth 2 (two) times daily., Disp: , Rfl:    telmisartan (MICARDIS) 40 MG tablet, Take 1 tablet (40 mg total) by mouth daily., Disp: 90 tablet, Rfl: 1   Vitamin D-Vitamin K (VITAMIN K2-VITAMIN D3 PO), Take by mouth in the morning and at bedtime., Disp: , Rfl:    vitamin E 200 UNIT capsule, Take 200 Units by mouth 2 (two) times daily., Disp: , Rfl:    zinc gluconate 50 MG tablet, Take 50 mg by mouth daily., Disp: , Rfl:   Allergies  Allergen Reactions   Ace Inhibitors Other (See Comments)    UNKNOWN REACTION   Duloxetine Other (See Comments)    Drowsiness    I personally reviewed active problem list, medication list, allergies, family history, social history, health maintenance with the patient/caregiver today.   ROS  Ten systems reviewed and is negative except as mentioned in HPI   Objective  Vitals:   12/01/21 1358  BP: 132/76  Pulse: 92  Resp: 16  SpO2: 96%  Weight: 219 lb (99.3 kg)  Height: '5\' 8"'$  (1.727 m)    Body mass index is 33.3 kg/m.  Physical Exam  Constitutional: Patient appears well-developed and well-nourished. Obese  No distress.  HEENT: head atraumatic, normocephalic, pupils equal and reactive to light, neck supple Cardiovascular: Normal rate, regular rhythm and normal heart sounds.  No murmur heard. No BLE edema. Pulmonary/Chest: Effort normal and breath sounds normal. No respiratory distress. Abdominal: Soft.  There is no tenderness. Psychiatric: Patient has a normal mood and affect. behavior is normal. Judgment and thought content normal.    PHQ2/9:    12/01/2021    1:57 PM 09/12/2021   10:53 AM 07/28/2021   10:31 AM 12/23/2020    10:58 AM 08/25/2020   10:21 AM  Depression screen PHQ 2/9  Decreased Interest 1 0 0 0 0  Down, Depressed, Hopeless 0 0 1 0 0  PHQ - 2 Score 1 0 1 0 0  Altered sleeping 0 0 0 0   Tired, decreased energy 3 0 1 0   Change in appetite 0 0 0 0   Feeling bad or failure about yourself  0 0 0 0   Trouble concentrating 0 0 0 0   Moving slowly or fidgety/restless 0 0 0 0   Suicidal thoughts 0 0 0 0   PHQ-9  Score 4 0 2 0     phq 9 is positive - she states she is upset because she does not feel well.    Fall Risk:    12/01/2021    1:57 PM 07/28/2021   10:31 AM 12/23/2020   10:58 AM 08/25/2020   10:25 AM 06/21/2020    1:22 PM  Fall Risk   Falls in the past year? 0 0 0 0 0  Number falls in past yr: 0 0 0 0 0  Injury with Fall? 0 0 0 0 0  Risk for fall due to : No Fall Risks No Fall Risks No Fall Risks No Fall Risks   Follow up Falls prevention discussed Falls prevention discussed Falls prevention discussed Falls prevention discussed       Functional Status Survey: Is the patient deaf or have difficulty hearing?: No Does the patient have difficulty seeing, even when wearing glasses/contacts?: No Does the patient have difficulty concentrating, remembering, or making decisions?: No Does the patient have difficulty walking or climbing stairs?: Yes Does the patient have difficulty dressing or bathing?: No Does the patient have difficulty doing errands alone such as visiting a doctor's office or shopping?: No    Assessment & Plan  1. Leukocytosis, unspecified type  - CBC with Differential/Platelet - CULTURE, URINE COMPREHENSIVE - DG Chest 2 View; Future  We will check for infection, but I am worried that she may had a heart attack on the day of onset of symptoms and if all studies negative she will need to follow up with cardiologist   2. Other fatigue  - CULTURE, URINE COMPREHENSIVE - DG Chest 2 View; Future   She denies feeling depressed, just tired of not feeling well

## 2021-11-29 NOTE — Telephone Encounter (Signed)
Spoke to pt, and she has to communicate with her daughter, who is also her transportation, to see when she would be able to bring her. Pt will call back once she knows

## 2021-12-01 ENCOUNTER — Encounter: Payer: Self-pay | Admitting: Family Medicine

## 2021-12-01 ENCOUNTER — Ambulatory Visit (INDEPENDENT_AMBULATORY_CARE_PROVIDER_SITE_OTHER): Payer: Medicare HMO | Admitting: Family Medicine

## 2021-12-01 ENCOUNTER — Ambulatory Visit
Admission: RE | Admit: 2021-12-01 | Discharge: 2021-12-01 | Disposition: A | Discharge status: Home / Self Care | Payer: Medicare HMO | Attending: Family Medicine | Admitting: Family Medicine

## 2021-12-01 ENCOUNTER — Ambulatory Visit
Admission: RE | Admit: 2021-12-01 | Discharge: 2021-12-01 | Disposition: A | Discharge status: Home / Self Care | Payer: Medicare HMO | Source: Ambulatory Visit | Attending: Family Medicine | Admitting: Family Medicine

## 2021-12-01 VITALS — BP 132/76 | HR 92 | Resp 16 | Ht 68.0 in | Wt 219.0 lb

## 2021-12-01 DIAGNOSIS — R5383 Other fatigue: Secondary | ICD-10-CM | POA: Insufficient documentation

## 2021-12-01 DIAGNOSIS — D72829 Elevated white blood cell count, unspecified: Secondary | ICD-10-CM

## 2021-12-01 DIAGNOSIS — I38 Endocarditis, valve unspecified: Secondary | ICD-10-CM | POA: Diagnosis not present

## 2021-12-01 DIAGNOSIS — Y838 Other surgical procedures as the cause of abnormal reaction of the patient, or of later complication, without mention of misadventure at the time of the procedure: Secondary | ICD-10-CM | POA: Diagnosis not present

## 2021-12-01 DIAGNOSIS — T826XXA Infection and inflammatory reaction due to cardiac valve prosthesis, initial encounter: Secondary | ICD-10-CM | POA: Diagnosis not present

## 2021-12-01 LAB — CBC WITH DIFFERENTIAL/PLATELET
Absolute Monocytes: 1385 cells/uL — ABNORMAL HIGH (ref 200–950)
Basophils Absolute: 64 cells/uL (ref 0–200)
Basophils Relative: 0.3 %
Eosinophils Absolute: 0 cells/uL — ABNORMAL LOW (ref 15–500)
Eosinophils Relative: 0 %
HCT: 38.8 % (ref 35.0–45.0)
Hemoglobin: 13.1 g/dL (ref 11.7–15.5)
Lymphs Abs: 1108 cells/uL (ref 850–3900)
MCH: 32.1 pg (ref 27.0–33.0)
MCHC: 33.8 g/dL (ref 32.0–36.0)
MCV: 95.1 fL (ref 80.0–100.0)
MPV: 9.9 fL (ref 7.5–12.5)
Monocytes Relative: 6.5 %
Neutro Abs: 18744 cells/uL — ABNORMAL HIGH (ref 1500–7800)
Neutrophils Relative %: 88 %
Platelets: 282 10*3/uL (ref 140–400)
RBC: 4.08 10*6/uL (ref 3.80–5.10)
RDW: 13.3 % (ref 11.0–15.0)
Total Lymphocyte: 5.2 %
WBC: 21.3 10*3/uL — ABNORMAL HIGH (ref 3.8–10.8)

## 2021-12-03 ENCOUNTER — Emergency Department: Payer: Medicare HMO

## 2021-12-03 ENCOUNTER — Emergency Department
Admission: EM | Admit: 2021-12-03 | Discharge: 2021-12-04 | Disposition: A | Discharge status: Home / Self Care | Payer: Medicare HMO | Attending: Emergency Medicine | Admitting: Emergency Medicine

## 2021-12-03 ENCOUNTER — Other Ambulatory Visit: Payer: Self-pay

## 2021-12-03 DIAGNOSIS — D72828 Other elevated white blood cell count: Secondary | ICD-10-CM | POA: Insufficient documentation

## 2021-12-03 DIAGNOSIS — Z952 Presence of prosthetic heart valve: Secondary | ICD-10-CM | POA: Diagnosis not present

## 2021-12-03 DIAGNOSIS — R4182 Altered mental status, unspecified: Secondary | ICD-10-CM | POA: Diagnosis not present

## 2021-12-03 DIAGNOSIS — R509 Fever, unspecified: Secondary | ICD-10-CM | POA: Diagnosis present

## 2021-12-03 DIAGNOSIS — D72829 Elevated white blood cell count, unspecified: Secondary | ICD-10-CM | POA: Diagnosis not present

## 2021-12-03 LAB — CBC WITH DIFFERENTIAL/PLATELET
Abs Immature Granulocytes: 0.32 10*3/uL — ABNORMAL HIGH (ref 0.00–0.07)
Basophils Absolute: 0.1 10*3/uL (ref 0.0–0.1)
Basophils Relative: 1 %
Eosinophils Absolute: 0 10*3/uL (ref 0.0–0.5)
Eosinophils Relative: 0 %
HCT: 39.2 % (ref 36.0–46.0)
Hemoglobin: 12.7 g/dL (ref 12.0–15.0)
Immature Granulocytes: 2 %
Lymphocytes Relative: 7 %
Lymphs Abs: 0.9 10*3/uL (ref 0.7–4.0)
MCH: 31.4 pg (ref 26.0–34.0)
MCHC: 32.4 g/dL (ref 30.0–36.0)
MCV: 96.8 fL (ref 80.0–100.0)
Monocytes Absolute: 0.9 10*3/uL (ref 0.1–1.0)
Monocytes Relative: 6 %
Neutro Abs: 12 10*3/uL — ABNORMAL HIGH (ref 1.7–7.7)
Neutrophils Relative %: 84 %
Platelets: 236 10*3/uL (ref 150–400)
RBC: 4.05 MIL/uL (ref 3.87–5.11)
RDW: 14.9 % (ref 11.5–15.5)
WBC: 14.2 10*3/uL — ABNORMAL HIGH (ref 4.0–10.5)
nRBC: 0 % (ref 0.0–0.2)

## 2021-12-03 LAB — COMPREHENSIVE METABOLIC PANEL
ALT: 60 U/L — ABNORMAL HIGH (ref 0–44)
AST: 64 U/L — ABNORMAL HIGH (ref 15–41)
Albumin: 2.9 g/dL — ABNORMAL LOW (ref 3.5–5.0)
Alkaline Phosphatase: 95 U/L (ref 38–126)
Anion gap: 9 (ref 5–15)
BUN: 14 mg/dL (ref 8–23)
CO2: 23 mmol/L (ref 22–32)
Calcium: 9.5 mg/dL (ref 8.9–10.3)
Chloride: 100 mmol/L (ref 98–111)
Creatinine, Ser: 0.82 mg/dL (ref 0.44–1.00)
GFR, Estimated: >60 mL/min (ref >60)
Glucose, Bld: 131 mg/dL — ABNORMAL HIGH (ref 70–99)
Potassium: 4.4 mmol/L (ref 3.5–5.1)
Sodium: 132 mmol/L — ABNORMAL LOW (ref 135–145)
Total Bilirubin: 1 mg/dL (ref 0.3–1.2)
Total Protein: 7.6 g/dL (ref 6.5–8.1)

## 2021-12-03 LAB — LACTIC ACID, PLASMA: Lactic Acid, Venous: 1.8 mmol/L (ref 0.5–1.9)

## 2021-12-03 NOTE — ED Triage Notes (Signed)
Pt presents via POV c/o being sent to the ED due to abnormal labs. Reports sent for WBC 21 from PCP which is an increase from prvious draw the same week. Reports fatigue. Denies chest pain/SOB.

## 2021-12-03 NOTE — ED Provider Triage Note (Signed)
Emergency Medicine Provider Triage Evaluation Note  Melody Soto, a 71 y.o. female with a history of aortic valve replacement, and questionable history of A-fib was evaluated in triage.  Pt complains of generalized fatigue since an episode of AMS last month.  Patient's sister told her she was having slurred speech and confusion last month, noting symptoms lasting less than 24 hours.  Since that time, the patient would endorse generalized fatigue, loss of appetite, and general feeling of being unwell.  She was evaluated by her cardiologist on Monday with labs and a chest x-ray which is pending, and was reevaluated by her PCP on Friday.  She was notified today of an elevated white blood cell count by the PTP, and advised to report to the ED for evaluation.  Review of Systems  Positive: Fatigue, malaise  Negative: CP, SOB  Physical Exam  BP (!) 105/58 (BP Location: Right Arm)   Pulse 79   Temp 98.5 F (36.9 C) (Oral)   Resp 18   SpO2 96%  Gen:   Awake, no distress  NAD Resp:  Normal effort CTA MSK:   Moves extremities without difficulty  Other:    Medical Decision Making  Medically screening exam initiated at 5:24 PM.  Appropriate orders placed.  [name] was informed that the remainder of the evaluation will be completed by another provider, this initial triage assessment does not replace that evaluation, and the importance of remaining in the ED until their evaluation is complete.   Geriatric patient to the ED for evaluation of 1 month generalized fatigue and malaise after a reported episode of AMS.   Melvenia Needles, PA-C 12/03/21 1802

## 2021-12-04 ENCOUNTER — Telehealth: Payer: Self-pay | Admitting: Emergency Medicine

## 2021-12-04 ENCOUNTER — Telehealth: Payer: Self-pay | Admitting: Pharmacist

## 2021-12-04 DIAGNOSIS — D72828 Other elevated white blood cell count: Secondary | ICD-10-CM | POA: Diagnosis not present

## 2021-12-04 LAB — BLOOD CULTURE ID PANEL (REFLEXED) - BCID2

## 2021-12-04 LAB — URINALYSIS, ROUTINE W REFLEX MICROSCOPIC
Bilirubin Urine: NEGATIVE
Glucose, UA: >=500 mg/dL — AB
Ketones, ur: 5 mg/dL — AB
Leukocytes,Ua: NEGATIVE
Nitrite: NEGATIVE
Protein, ur: 30 mg/dL — AB
Specific Gravity, Urine: 1.029 (ref 1.005–1.030)
pH: 5 (ref 5.0–8.0)

## 2021-12-04 LAB — TROPONIN I (HIGH SENSITIVITY): Troponin I (High Sensitivity): 86 ng/L — ABNORMAL HIGH (ref <18)

## 2021-12-04 LAB — GROUP A STREP BY PCR: Group A Strep by PCR: NOT DETECTED

## 2021-12-04 MED ORDER — ACETAMINOPHEN 500 MG PO TABS
1000.0000 mg | ORAL_TABLET | Freq: Once | ORAL | Status: AC
  Administered 2021-12-04: 1000 mg via ORAL
  Filled 2021-12-04: qty 2

## 2021-12-04 NOTE — ED Notes (Signed)
Pt given crackers and water. Labs not collected per MD D. Tamala Julian

## 2021-12-04 NOTE — ED Notes (Signed)
Pt Dc to home. Dc instructions reviewed with all questions answered. Pt verbalizes understanding. Pt assisted out of dept via wheelchair with family

## 2021-12-04 NOTE — Progress Notes (Signed)
Pharmacy - Antimicrobial Stewardship (Blood culture result)  Patient noted to have [place] in blood culture collected 10/22 with BCID detecting E faecalis.    She was contacted by ED RN earlier this am but call went to voicemail per note  Spoke to patient at 12p today and instructed her to return to ED with anticipation of being admitted for positive blood culture.  She states her daughter should be able to transport her to ED later this afternoon.  If does not have transportation, I suggested calling 911.    Doreene Eland, PharmD, BCPS, BCIDP Work Cell: [phone] 12/04/2021 12:06 PM

## 2021-12-04 NOTE — ED Notes (Shared)
Pt was contacted by pharmacy with positive blood culture and was told to return for treatment and possible admission, daughter calling and wants info as she states mother really isn't sure about need for return, I personally spoke with Dr Joni Fears and he did confirm need for pt to return and I also spoke with pt and she states that we can discuss her chart with her daughter [name]

## 2021-12-04 NOTE — Telephone Encounter (Signed)
This RN received +blood culture result from lab, spoke with EDP regarding positive result. Per EDP call patient to return to ED. Attempted to call patient, no answer, left HIPAA compliant voicemail for patient.

## 2021-12-04 NOTE — ED Provider Notes (Signed)
[facility] Provider Note    Event Date/Time   First MD Initiated Contact with Patient 12/04/21 0000     (approximate)   History   No chief complaint on file.   HPI  Melody Soto is a 71 y.o. female who presents to the ED for evaluation of No chief complaint on file.   Patient presents to the ED with her daughter for evaluation of outpatient abnormal blood work and chronic fatigue.  Patient reports at least 6 weeks of feeling generalized malaise and fatigue without focal features.  Had low-grade temperatures about a month ago up to 100.4, but none more recently than this.  Denies any recent antibiotics or steroids.  No focal illnesses, medication changes, falls, syncopal episodes or pain anywhere in her body.  Some slightly decreased appetite.  Reports a somewhat hoarse voice and mildly sore throat with odynophagia.  Reports blood work done at her cardiologist's on 10/16 with a WBC of 14, so she followed up with her PCP on 10/20 and WBC of 20 then.  Clear CXR.  Due to the elevated WBC she was advised to come to the ED for further evaluation.   Physical Exam   Triage Vital Signs: ED Triage Vitals [12/03/21 1715]  Enc Vitals Group     BP (!) 105/58     Pulse Rate 79     Resp 18     Temp 98.5 F (36.9 C)     Temp Source Oral     SpO2 96 %     Weight      Height      Head Circumference      Peak Flow      Pain Score 0     Pain Loc      Pain Edu?      Excl. in [place]?     Most recent vital signs: Vitals:   12/04/21 0058 12/04/21 0101  BP: (!) 123/53 (!) 116/54  Pulse: 89 77  Resp: 17 18  Temp:  98.3 F (36.8 C)  SpO2: 100% 99%    General: Awake, no distress.  Pleasant and conversational.  Looks well.  Sits up independently so I can examine her back.  Mildly hoarse voice noted.  No anterior cervical or posterior cervical lymphadenopathy. CV:  Good peripheral perfusion.  Resp:  Normal effort.  Clear Abd:  No distention.  Soft and benign  throughout MSK:  No deformity noted.  No rashes.  Chronic lymphedema is noted. Neuro:  No focal deficits appreciated. Other:     ED Results / Procedures / Treatments   Labs (all labs ordered are listed, but only abnormal results are displayed) Labs Reviewed  COMPREHENSIVE METABOLIC PANEL - Abnormal; Notable for the following components:      Result Value   Sodium 132 (*)    Glucose, Bld 131 (*)    Albumin 2.9 (*)    AST 64 (*)    ALT 60 (*)    All other components within normal limits  CBC WITH DIFFERENTIAL/PLATELET - Abnormal; Notable for the following components:   WBC 14.2 (*)    Neutro Abs 12.0 (*)    Abs Immature Granulocytes 0.32 (*)    All other components within normal limits  URINALYSIS, ROUTINE W REFLEX MICROSCOPIC - Abnormal; Notable for the following components:   Color, Urine AMBER (*)    APPearance CLOUDY (*)    Glucose, UA >=500 (*)    Hgb urine dipstick MODERATE (*)  Ketones, ur 5 (*)    Protein, ur 30 (*)    Bacteria, UA RARE (*)    All other components within normal limits  GROUP A STREP BY PCR  CULTURE, BLOOD (ROUTINE X 2)  URINE CULTURE  LACTIC ACID, PLASMA  LACTIC ACID, PLASMA  TROPONIN I (HIGH SENSITIVITY)  TROPONIN I (HIGH SENSITIVITY)    EKG   RADIOLOGY CXR interpreted by me without evidence of acute cardiopulmonary pathology. CT head interpreted by me without evidence of acute intracranial pathology  Official radiology report(s): CT HEAD WO CONTRAST (5MM)  Result Date: 12/03/2021 CLINICAL DATA:  Mental status change, unknown cause EXAM: CT HEAD WITHOUT CONTRAST TECHNIQUE: Contiguous axial images were obtained from the base of the skull through the vertex without intravenous contrast. RADIATION DOSE REDUCTION: This exam was performed according to the departmental dose-optimization program which includes automated exposure control, adjustment of the mA and/or kV according to patient size and/or use of iterative reconstruction technique.  COMPARISON:  None Available. FINDINGS: Brain: No evidence of large-territorial acute infarction. No parenchymal hemorrhage. No mass lesion. No extra-axial collection. No mass effect or midline shift. No hydrocephalus. Basilar cisterns are patent. Vascular: No hyperdense vessel. Atherosclerotic calcifications are present within the cavernous internal carotid arteries. Skull: No acute fracture or focal lesion. Sinuses/Orbits: Paranasal sinuses and mastoid air cells are clear. Bilateral lens replacement. Otherwise the orbits are unremarkable. Other: None. IMPRESSION: No acute intracranial abnormality. Electronically Signed   By: Iven Finn M.D.   On: 12/03/2021 18:11   DG Chest 2 View  Result Date: 12/03/2021 CLINICAL DATA:  A 71 year old female presents for evaluation of suspected sepsis. EXAM: CHEST - 2 VIEW COMPARISON:  October 2023. FINDINGS: Trachea midline. Cardiomediastinal contours and hilar structures are stable. Lungs are clear. No sign of pleural effusion or pneumothorax. Signs of trans arterial aortic valve replacement. On limited assessment no acute skeletal findings. IMPRESSION: No active cardiopulmonary disease. Electronically Signed   By: Zetta Bills M.D.   On: 12/03/2021 18:01    PROCEDURES and INTERVENTIONS:  .1-3 Lead EKG Interpretation  Performed by: Vladimir Crofts, MD Authorized by: Vladimir Crofts, MD     Interpretation: normal     ECG rate:  76   ECG rate assessment: normal     Rhythm: sinus rhythm     Ectopy: none     Conduction: normal     Medications  acetaminophen (TYLENOL) tablet 1,000 mg (1,000 mg Oral Given 12/04/21 0057)     IMPRESSION / MDM / ASSESSMENT AND PLAN / ED COURSE  I reviewed the triage vital signs and the nursing notes.  Differential diagnosis includes, but is not limited to, sepsis, depression, cystitis, cellulitis, AKI  {Patient presents with symptoms of an acute illness or injury that is potentially life-threatening.  71 year old woman  presents to the ED with chronic fatigue and outpatient acute leukocytosis without evidence of acute infectious pathology or sepsis, ultimately suitable for outpatient management.  Looks systemically well.  Repeat WBC here has improved from her recent outpatient testing.  No other signs of sepsis or SIRS criteria.  No fevers.  Urine with some ketones suggestive of dehydration, but no clear infectious features.  Negative strep test.  Clear CXR and CT head.  When chatting with the patient, she does have some significant stressors at home, particularly financial.  We discussed the possibility of an acute depressive episode contributing to her symptoms.  She has no evidence of psychiatric emergency or suicidality to necessitate IVC or emergent psychiatric evaluation.  Discussed coping resources and mechanisms.  Discussed return precautions.  I considered observation admission for this patient, but she would be suitable for outpatient management.  Clinical Course as of 12/04/21 [place] Dec 04, 2021  0150 Reassessed. Discuss workup and plan of care.  [DS]    Clinical Course User Index [DS] Vladimir Crofts, MD     FINAL CLINICAL IMPRESSION(S) / ED DIAGNOSES   Final diagnoses:  Other elevated white blood cell (WBC) count     Rx / DC Orders   ED Discharge Orders     None        Note:  This document was prepared using Dragon voice recognition software and may include unintentional dictation errors.   Vladimir Crofts, MD 12/04/21 [phone]

## 2021-12-05 ENCOUNTER — Other Ambulatory Visit: Payer: Medicare HMO

## 2021-12-05 DIAGNOSIS — I499 Cardiac arrhythmia, unspecified: Secondary | ICD-10-CM | POA: Diagnosis not present

## 2021-12-05 DIAGNOSIS — E871 Hypo-osmolality and hyponatremia: Secondary | ICD-10-CM | POA: Diagnosis not present

## 2021-12-05 DIAGNOSIS — Y831 Surgical operation with implant of artificial internal device as the cause of abnormal reaction of the patient, or of later complication, without mention of misadventure at the time of the procedure: Secondary | ICD-10-CM | POA: Diagnosis not present

## 2021-12-05 DIAGNOSIS — I708 Atherosclerosis of other arteries: Secondary | ICD-10-CM | POA: Diagnosis not present

## 2021-12-05 DIAGNOSIS — I4891 Unspecified atrial fibrillation: Secondary | ICD-10-CM | POA: Diagnosis not present

## 2021-12-05 DIAGNOSIS — Y712 Prosthetic and other implants, materials and accessory cardiovascular devices associated with adverse incidents: Secondary | ICD-10-CM | POA: Diagnosis not present

## 2021-12-05 DIAGNOSIS — Z952 Presence of prosthetic heart valve: Secondary | ICD-10-CM | POA: Diagnosis not present

## 2021-12-05 DIAGNOSIS — I480 Paroxysmal atrial fibrillation: Secondary | ICD-10-CM | POA: Diagnosis not present

## 2021-12-05 DIAGNOSIS — E669 Obesity, unspecified: Secondary | ICD-10-CM | POA: Diagnosis not present

## 2021-12-05 DIAGNOSIS — Z6834 Body mass index (BMI) 34.0-34.9, adult: Secondary | ICD-10-CM | POA: Diagnosis not present

## 2021-12-05 DIAGNOSIS — I38 Endocarditis, valve unspecified: Secondary | ICD-10-CM | POA: Diagnosis not present

## 2021-12-05 DIAGNOSIS — K8020 Calculus of gallbladder without cholecystitis without obstruction: Secondary | ICD-10-CM | POA: Diagnosis not present

## 2021-12-05 DIAGNOSIS — B952 Enterococcus as the cause of diseases classified elsewhere: Secondary | ICD-10-CM | POA: Diagnosis not present

## 2021-12-05 DIAGNOSIS — I951 Orthostatic hypotension: Secondary | ICD-10-CM | POA: Diagnosis not present

## 2021-12-05 DIAGNOSIS — R7881 Bacteremia: Secondary | ICD-10-CM | POA: Diagnosis not present

## 2021-12-05 DIAGNOSIS — D539 Nutritional anemia, unspecified: Secondary | ICD-10-CM | POA: Diagnosis not present

## 2021-12-05 DIAGNOSIS — E8809 Other disorders of plasma-protein metabolism, not elsewhere classified: Secondary | ICD-10-CM | POA: Diagnosis not present

## 2021-12-05 DIAGNOSIS — I5020 Unspecified systolic (congestive) heart failure: Secondary | ICD-10-CM | POA: Diagnosis not present

## 2021-12-05 DIAGNOSIS — I5042 Chronic combined systolic (congestive) and diastolic (congestive) heart failure: Secondary | ICD-10-CM | POA: Diagnosis not present

## 2021-12-05 DIAGNOSIS — E119 Type 2 diabetes mellitus without complications: Secondary | ICD-10-CM | POA: Diagnosis not present

## 2021-12-05 DIAGNOSIS — D735 Infarction of spleen: Secondary | ICD-10-CM | POA: Diagnosis not present

## 2021-12-05 DIAGNOSIS — R5383 Other fatigue: Secondary | ICD-10-CM | POA: Diagnosis not present

## 2021-12-05 DIAGNOSIS — R791 Abnormal coagulation profile: Secondary | ICD-10-CM | POA: Diagnosis not present

## 2021-12-05 DIAGNOSIS — I5A Non-ischemic myocardial injury (non-traumatic): Secondary | ICD-10-CM | POA: Diagnosis not present

## 2021-12-05 DIAGNOSIS — Z7982 Long term (current) use of aspirin: Secondary | ICD-10-CM | POA: Diagnosis not present

## 2021-12-05 DIAGNOSIS — I110 Hypertensive heart disease with heart failure: Secondary | ICD-10-CM | POA: Diagnosis not present

## 2021-12-05 DIAGNOSIS — I890 Lymphedema, not elsewhere classified: Secondary | ICD-10-CM | POA: Diagnosis not present

## 2021-12-05 DIAGNOSIS — I319 Disease of pericardium, unspecified: Secondary | ICD-10-CM | POA: Diagnosis not present

## 2021-12-05 DIAGNOSIS — Z79899 Other long term (current) drug therapy: Secondary | ICD-10-CM | POA: Diagnosis not present

## 2021-12-05 DIAGNOSIS — I358 Other nonrheumatic aortic valve disorders: Secondary | ICD-10-CM | POA: Diagnosis not present

## 2021-12-05 DIAGNOSIS — I330 Acute and subacute infective endocarditis: Secondary | ICD-10-CM | POA: Diagnosis not present

## 2021-12-05 DIAGNOSIS — Z8542 Personal history of malignant neoplasm of other parts of uterus: Secondary | ICD-10-CM | POA: Diagnosis not present

## 2021-12-05 DIAGNOSIS — E8770 Fluid overload, unspecified: Secondary | ICD-10-CM | POA: Diagnosis not present

## 2021-12-05 DIAGNOSIS — Z923 Personal history of irradiation: Secondary | ICD-10-CM | POA: Diagnosis not present

## 2021-12-05 DIAGNOSIS — I351 Nonrheumatic aortic (valve) insufficiency: Secondary | ICD-10-CM | POA: Diagnosis not present

## 2021-12-05 DIAGNOSIS — I350 Nonrheumatic aortic (valve) stenosis: Secondary | ICD-10-CM | POA: Diagnosis not present

## 2021-12-05 DIAGNOSIS — T826XXA Infection and inflammatory reaction due to cardiac valve prosthesis, initial encounter: Secondary | ICD-10-CM | POA: Diagnosis not present

## 2021-12-05 DIAGNOSIS — I959 Hypotension, unspecified: Secondary | ICD-10-CM | POA: Diagnosis not present

## 2021-12-05 DIAGNOSIS — D72829 Elevated white blood cell count, unspecified: Secondary | ICD-10-CM | POA: Diagnosis not present

## 2021-12-05 DIAGNOSIS — E785 Hyperlipidemia, unspecified: Secondary | ICD-10-CM | POA: Diagnosis not present

## 2021-12-05 DIAGNOSIS — R1032 Left lower quadrant pain: Secondary | ICD-10-CM | POA: Diagnosis not present

## 2021-12-05 DIAGNOSIS — Z7901 Long term (current) use of anticoagulants: Secondary | ICD-10-CM | POA: Diagnosis not present

## 2021-12-05 DIAGNOSIS — Z8679 Personal history of other diseases of the circulatory system: Secondary | ICD-10-CM | POA: Diagnosis not present

## 2021-12-05 DIAGNOSIS — Z7984 Long term (current) use of oral hypoglycemic drugs: Secondary | ICD-10-CM | POA: Diagnosis not present

## 2021-12-05 DIAGNOSIS — R7989 Other specified abnormal findings of blood chemistry: Secondary | ICD-10-CM | POA: Diagnosis not present

## 2021-12-05 DIAGNOSIS — D509 Iron deficiency anemia, unspecified: Secondary | ICD-10-CM | POA: Diagnosis not present

## 2021-12-05 LAB — URINE CULTURE

## 2021-12-05 NOTE — ED Notes (Signed)
I did personally speak with daughter [name] on 12/04/21 after confirmation from mom that she can be given info about her chart and did stress importance of her mother returning for more care and daughter asked about blood culture result and I did spell it out for her as she asked me to. I also stressed to daughter that ED MD is asking for return of pt as well. Note was sent to the following RN's as well Michael Boston, Sharyn Blitz, and Genelle Gather.

## 2021-12-05 NOTE — Telephone Encounter (Signed)
Patient had told ED secretary that she wanted [place] to speak to her daughter about her blood culture.  I called her daughter and left a message asking her to call me.

## 2021-12-06 DIAGNOSIS — R7881 Bacteremia: Secondary | ICD-10-CM | POA: Diagnosis not present

## 2021-12-06 DIAGNOSIS — R5383 Other fatigue: Secondary | ICD-10-CM | POA: Diagnosis not present

## 2021-12-06 DIAGNOSIS — T826XXA Infection and inflammatory reaction due to cardiac valve prosthesis, initial encounter: Secondary | ICD-10-CM | POA: Diagnosis not present

## 2021-12-06 DIAGNOSIS — I38 Endocarditis, valve unspecified: Secondary | ICD-10-CM | POA: Diagnosis not present

## 2021-12-06 DIAGNOSIS — B952 Enterococcus as the cause of diseases classified elsewhere: Secondary | ICD-10-CM | POA: Diagnosis not present

## 2021-12-06 DIAGNOSIS — Z8679 Personal history of other diseases of the circulatory system: Secondary | ICD-10-CM | POA: Diagnosis not present

## 2021-12-06 DIAGNOSIS — I351 Nonrheumatic aortic (valve) insufficiency: Secondary | ICD-10-CM | POA: Diagnosis not present

## 2021-12-06 DIAGNOSIS — I350 Nonrheumatic aortic (valve) stenosis: Secondary | ICD-10-CM | POA: Diagnosis not present

## 2021-12-06 DIAGNOSIS — R7989 Other specified abnormal findings of blood chemistry: Secondary | ICD-10-CM | POA: Diagnosis not present

## 2021-12-06 DIAGNOSIS — I5020 Unspecified systolic (congestive) heart failure: Secondary | ICD-10-CM | POA: Diagnosis not present

## 2021-12-06 DIAGNOSIS — Z952 Presence of prosthetic heart valve: Secondary | ICD-10-CM | POA: Diagnosis not present

## 2021-12-06 LAB — CULTURE, BLOOD (ROUTINE X 2): Special Requests: ADEQUATE

## 2021-12-07 DIAGNOSIS — I330 Acute and subacute infective endocarditis: Secondary | ICD-10-CM | POA: Diagnosis not present

## 2021-12-07 DIAGNOSIS — Z452 Encounter for adjustment and management of vascular access device: Secondary | ICD-10-CM | POA: Diagnosis not present

## 2021-12-07 DIAGNOSIS — T826XXD Infection and inflammatory reaction due to cardiac valve prosthesis, subsequent encounter: Secondary | ICD-10-CM | POA: Diagnosis not present

## 2021-12-07 DIAGNOSIS — I38 Endocarditis, valve unspecified: Secondary | ICD-10-CM | POA: Diagnosis not present

## 2021-12-07 DIAGNOSIS — I9789 Other postprocedural complications and disorders of the circulatory system, not elsewhere classified: Secondary | ICD-10-CM | POA: Diagnosis not present

## 2021-12-07 DIAGNOSIS — I890 Lymphedema, not elsewhere classified: Secondary | ICD-10-CM | POA: Diagnosis not present

## 2021-12-07 DIAGNOSIS — I951 Orthostatic hypotension: Secondary | ICD-10-CM | POA: Diagnosis not present

## 2021-12-07 DIAGNOSIS — Z7984 Long term (current) use of oral hypoglycemic drugs: Secondary | ICD-10-CM | POA: Diagnosis not present

## 2021-12-07 DIAGNOSIS — Z7901 Long term (current) use of anticoagulants: Secondary | ICD-10-CM | POA: Diagnosis not present

## 2021-12-07 DIAGNOSIS — Z923 Personal history of irradiation: Secondary | ICD-10-CM | POA: Diagnosis not present

## 2021-12-07 DIAGNOSIS — R918 Other nonspecific abnormal finding of lung field: Secondary | ICD-10-CM | POA: Diagnosis not present

## 2021-12-07 DIAGNOSIS — I5042 Chronic combined systolic (congestive) and diastolic (congestive) heart failure: Secondary | ICD-10-CM | POA: Diagnosis not present

## 2021-12-07 DIAGNOSIS — E119 Type 2 diabetes mellitus without complications: Secondary | ICD-10-CM | POA: Diagnosis not present

## 2021-12-07 DIAGNOSIS — Z01810 Encounter for preprocedural cardiovascular examination: Secondary | ICD-10-CM | POA: Diagnosis not present

## 2021-12-07 DIAGNOSIS — D735 Infarction of spleen: Secondary | ICD-10-CM | POA: Diagnosis not present

## 2021-12-07 DIAGNOSIS — I110 Hypertensive heart disease with heart failure: Secondary | ICD-10-CM | POA: Diagnosis not present

## 2021-12-07 DIAGNOSIS — Z4682 Encounter for fitting and adjustment of non-vascular catheter: Secondary | ICD-10-CM | POA: Diagnosis not present

## 2021-12-07 DIAGNOSIS — I480 Paroxysmal atrial fibrillation: Secondary | ICD-10-CM | POA: Diagnosis not present

## 2021-12-07 DIAGNOSIS — D638 Anemia in other chronic diseases classified elsewhere: Secondary | ICD-10-CM | POA: Diagnosis not present

## 2021-12-07 DIAGNOSIS — I2510 Atherosclerotic heart disease of native coronary artery without angina pectoris: Secondary | ICD-10-CM | POA: Diagnosis not present

## 2021-12-07 DIAGNOSIS — I499 Cardiac arrhythmia, unspecified: Secondary | ICD-10-CM | POA: Diagnosis not present

## 2021-12-07 DIAGNOSIS — B952 Enterococcus as the cause of diseases classified elsewhere: Secondary | ICD-10-CM | POA: Diagnosis not present

## 2021-12-07 DIAGNOSIS — I5030 Unspecified diastolic (congestive) heart failure: Secondary | ICD-10-CM | POA: Diagnosis not present

## 2021-12-07 DIAGNOSIS — D539 Nutritional anemia, unspecified: Secondary | ICD-10-CM | POA: Diagnosis not present

## 2021-12-07 DIAGNOSIS — E785 Hyperlipidemia, unspecified: Secondary | ICD-10-CM | POA: Diagnosis not present

## 2021-12-07 DIAGNOSIS — I5020 Unspecified systolic (congestive) heart failure: Secondary | ICD-10-CM | POA: Diagnosis not present

## 2021-12-07 DIAGNOSIS — I4891 Unspecified atrial fibrillation: Secondary | ICD-10-CM | POA: Diagnosis not present

## 2021-12-07 DIAGNOSIS — Z4659 Encounter for fitting and adjustment of other gastrointestinal appliance and device: Secondary | ICD-10-CM | POA: Diagnosis not present

## 2021-12-07 DIAGNOSIS — R7881 Bacteremia: Secondary | ICD-10-CM | POA: Diagnosis not present

## 2021-12-07 DIAGNOSIS — I959 Hypotension, unspecified: Secondary | ICD-10-CM | POA: Diagnosis not present

## 2021-12-07 DIAGNOSIS — I708 Atherosclerosis of other arteries: Secondary | ICD-10-CM | POA: Diagnosis not present

## 2021-12-07 DIAGNOSIS — D509 Iron deficiency anemia, unspecified: Secondary | ICD-10-CM | POA: Diagnosis not present

## 2021-12-07 DIAGNOSIS — I4892 Unspecified atrial flutter: Secondary | ICD-10-CM | POA: Diagnosis not present

## 2021-12-07 DIAGNOSIS — Z8679 Personal history of other diseases of the circulatory system: Secondary | ICD-10-CM | POA: Diagnosis not present

## 2021-12-07 DIAGNOSIS — J939 Pneumothorax, unspecified: Secondary | ICD-10-CM | POA: Diagnosis not present

## 2021-12-07 DIAGNOSIS — J90 Pleural effusion, not elsewhere classified: Secondary | ICD-10-CM | POA: Diagnosis not present

## 2021-12-07 DIAGNOSIS — I359 Nonrheumatic aortic valve disorder, unspecified: Secondary | ICD-10-CM | POA: Diagnosis not present

## 2021-12-07 DIAGNOSIS — I358 Other nonrheumatic aortic valve disorders: Secondary | ICD-10-CM | POA: Diagnosis not present

## 2021-12-07 DIAGNOSIS — I517 Cardiomegaly: Secondary | ICD-10-CM | POA: Diagnosis not present

## 2021-12-07 DIAGNOSIS — Z79899 Other long term (current) drug therapy: Secondary | ICD-10-CM | POA: Diagnosis not present

## 2021-12-07 DIAGNOSIS — J9811 Atelectasis: Secondary | ICD-10-CM | POA: Diagnosis not present

## 2021-12-07 DIAGNOSIS — Z952 Presence of prosthetic heart valve: Secondary | ICD-10-CM | POA: Diagnosis not present

## 2021-12-07 DIAGNOSIS — J811 Chronic pulmonary edema: Secondary | ICD-10-CM | POA: Diagnosis not present

## 2021-12-07 DIAGNOSIS — Z7982 Long term (current) use of aspirin: Secondary | ICD-10-CM | POA: Diagnosis not present

## 2021-12-07 DIAGNOSIS — Y712 Prosthetic and other implants, materials and accessory cardiovascular devices associated with adverse incidents: Secondary | ICD-10-CM | POA: Diagnosis not present

## 2021-12-07 DIAGNOSIS — I319 Disease of pericardium, unspecified: Secondary | ICD-10-CM | POA: Diagnosis not present

## 2021-12-07 DIAGNOSIS — T826XXA Infection and inflammatory reaction due to cardiac valve prosthesis, initial encounter: Secondary | ICD-10-CM | POA: Diagnosis not present

## 2021-12-11 ENCOUNTER — Encounter (INDEPENDENT_AMBULATORY_CARE_PROVIDER_SITE_OTHER): Payer: Self-pay

## 2021-12-15 HISTORY — PX: AORTIC VALVE REPLACEMENT: SHX41

## 2021-12-28 DIAGNOSIS — T826XXA Infection and inflammatory reaction due to cardiac valve prosthesis, initial encounter: Secondary | ICD-10-CM | POA: Diagnosis not present

## 2021-12-28 DIAGNOSIS — I38 Endocarditis, valve unspecified: Secondary | ICD-10-CM | POA: Diagnosis not present

## 2021-12-29 DIAGNOSIS — I890 Lymphedema, not elsewhere classified: Secondary | ICD-10-CM | POA: Diagnosis not present

## 2021-12-29 DIAGNOSIS — T826XXA Infection and inflammatory reaction due to cardiac valve prosthesis, initial encounter: Secondary | ICD-10-CM | POA: Diagnosis not present

## 2021-12-29 DIAGNOSIS — I480 Paroxysmal atrial fibrillation: Secondary | ICD-10-CM | POA: Diagnosis not present

## 2021-12-29 DIAGNOSIS — E1165 Type 2 diabetes mellitus with hyperglycemia: Secondary | ICD-10-CM | POA: Diagnosis not present

## 2021-12-29 DIAGNOSIS — B952 Enterococcus as the cause of diseases classified elsewhere: Secondary | ICD-10-CM | POA: Diagnosis not present

## 2021-12-29 DIAGNOSIS — I708 Atherosclerosis of other arteries: Secondary | ICD-10-CM | POA: Diagnosis not present

## 2021-12-29 DIAGNOSIS — I5042 Chronic combined systolic (congestive) and diastolic (congestive) heart failure: Secondary | ICD-10-CM | POA: Diagnosis not present

## 2021-12-29 DIAGNOSIS — I110 Hypertensive heart disease with heart failure: Secondary | ICD-10-CM | POA: Diagnosis not present

## 2021-12-29 DIAGNOSIS — D649 Anemia, unspecified: Secondary | ICD-10-CM | POA: Diagnosis not present

## 2021-12-29 DIAGNOSIS — I38 Endocarditis, valve unspecified: Secondary | ICD-10-CM | POA: Diagnosis not present

## 2021-12-29 DIAGNOSIS — F339 Major depressive disorder, recurrent, unspecified: Secondary | ICD-10-CM | POA: Diagnosis not present

## 2021-12-29 DIAGNOSIS — I951 Orthostatic hypotension: Secondary | ICD-10-CM | POA: Diagnosis not present

## 2021-12-29 DIAGNOSIS — E782 Mixed hyperlipidemia: Secondary | ICD-10-CM | POA: Diagnosis not present

## 2021-12-31 DIAGNOSIS — T826XXA Infection and inflammatory reaction due to cardiac valve prosthesis, initial encounter: Secondary | ICD-10-CM | POA: Diagnosis not present

## 2021-12-31 DIAGNOSIS — I38 Endocarditis, valve unspecified: Secondary | ICD-10-CM | POA: Diagnosis not present

## 2022-01-01 DIAGNOSIS — D649 Anemia, unspecified: Secondary | ICD-10-CM | POA: Diagnosis not present

## 2022-01-01 DIAGNOSIS — F339 Major depressive disorder, recurrent, unspecified: Secondary | ICD-10-CM | POA: Diagnosis not present

## 2022-01-01 DIAGNOSIS — T826XXA Infection and inflammatory reaction due to cardiac valve prosthesis, initial encounter: Secondary | ICD-10-CM | POA: Diagnosis not present

## 2022-01-01 DIAGNOSIS — I5042 Chronic combined systolic (congestive) and diastolic (congestive) heart failure: Secondary | ICD-10-CM | POA: Diagnosis not present

## 2022-01-01 DIAGNOSIS — I480 Paroxysmal atrial fibrillation: Secondary | ICD-10-CM | POA: Diagnosis not present

## 2022-01-01 DIAGNOSIS — I38 Endocarditis, valve unspecified: Secondary | ICD-10-CM | POA: Diagnosis not present

## 2022-01-01 DIAGNOSIS — B952 Enterococcus as the cause of diseases classified elsewhere: Secondary | ICD-10-CM | POA: Diagnosis not present

## 2022-01-01 DIAGNOSIS — I890 Lymphedema, not elsewhere classified: Secondary | ICD-10-CM | POA: Diagnosis not present

## 2022-01-01 DIAGNOSIS — I951 Orthostatic hypotension: Secondary | ICD-10-CM | POA: Diagnosis not present

## 2022-01-01 DIAGNOSIS — Y831 Surgical operation with implant of artificial internal device as the cause of abnormal reaction of the patient, or of later complication, without mention of misadventure at the time of the procedure: Secondary | ICD-10-CM | POA: Diagnosis not present

## 2022-01-01 DIAGNOSIS — I708 Atherosclerosis of other arteries: Secondary | ICD-10-CM | POA: Diagnosis not present

## 2022-01-01 DIAGNOSIS — E782 Mixed hyperlipidemia: Secondary | ICD-10-CM | POA: Diagnosis not present

## 2022-01-01 DIAGNOSIS — I110 Hypertensive heart disease with heart failure: Secondary | ICD-10-CM | POA: Diagnosis not present

## 2022-01-01 DIAGNOSIS — E1165 Type 2 diabetes mellitus with hyperglycemia: Secondary | ICD-10-CM | POA: Diagnosis not present

## 2022-01-02 ENCOUNTER — Telehealth: Payer: Self-pay | Admitting: Family Medicine

## 2022-01-02 DIAGNOSIS — I951 Orthostatic hypotension: Secondary | ICD-10-CM | POA: Diagnosis not present

## 2022-01-02 DIAGNOSIS — I38 Endocarditis, valve unspecified: Secondary | ICD-10-CM | POA: Diagnosis not present

## 2022-01-02 DIAGNOSIS — I480 Paroxysmal atrial fibrillation: Secondary | ICD-10-CM | POA: Diagnosis not present

## 2022-01-02 DIAGNOSIS — T826XXA Infection and inflammatory reaction due to cardiac valve prosthesis, initial encounter: Secondary | ICD-10-CM | POA: Diagnosis not present

## 2022-01-02 DIAGNOSIS — F339 Major depressive disorder, recurrent, unspecified: Secondary | ICD-10-CM | POA: Diagnosis not present

## 2022-01-02 DIAGNOSIS — I890 Lymphedema, not elsewhere classified: Secondary | ICD-10-CM | POA: Diagnosis not present

## 2022-01-02 DIAGNOSIS — I5042 Chronic combined systolic (congestive) and diastolic (congestive) heart failure: Secondary | ICD-10-CM | POA: Diagnosis not present

## 2022-01-02 DIAGNOSIS — E782 Mixed hyperlipidemia: Secondary | ICD-10-CM | POA: Diagnosis not present

## 2022-01-02 DIAGNOSIS — B952 Enterococcus as the cause of diseases classified elsewhere: Secondary | ICD-10-CM | POA: Diagnosis not present

## 2022-01-02 DIAGNOSIS — I110 Hypertensive heart disease with heart failure: Secondary | ICD-10-CM | POA: Diagnosis not present

## 2022-01-02 DIAGNOSIS — E1165 Type 2 diabetes mellitus with hyperglycemia: Secondary | ICD-10-CM | POA: Diagnosis not present

## 2022-01-02 DIAGNOSIS — I708 Atherosclerosis of other arteries: Secondary | ICD-10-CM | POA: Diagnosis not present

## 2022-01-02 DIAGNOSIS — D649 Anemia, unspecified: Secondary | ICD-10-CM | POA: Diagnosis not present

## 2022-01-02 NOTE — Telephone Encounter (Signed)
Sheryl with [facility] called asking if patients Telmisartin 40 has been discontinued  CB@  [phone] she has confidential voice mail.

## 2022-01-03 ENCOUNTER — Telehealth: Payer: Self-pay | Admitting: Family Medicine

## 2022-01-03 DIAGNOSIS — B952 Enterococcus as the cause of diseases classified elsewhere: Secondary | ICD-10-CM | POA: Diagnosis not present

## 2022-01-03 DIAGNOSIS — D649 Anemia, unspecified: Secondary | ICD-10-CM | POA: Diagnosis not present

## 2022-01-03 DIAGNOSIS — T826XXA Infection and inflammatory reaction due to cardiac valve prosthesis, initial encounter: Secondary | ICD-10-CM | POA: Diagnosis not present

## 2022-01-03 DIAGNOSIS — I708 Atherosclerosis of other arteries: Secondary | ICD-10-CM | POA: Diagnosis not present

## 2022-01-03 DIAGNOSIS — I951 Orthostatic hypotension: Secondary | ICD-10-CM | POA: Diagnosis not present

## 2022-01-03 DIAGNOSIS — I480 Paroxysmal atrial fibrillation: Secondary | ICD-10-CM | POA: Diagnosis not present

## 2022-01-03 DIAGNOSIS — I5042 Chronic combined systolic (congestive) and diastolic (congestive) heart failure: Secondary | ICD-10-CM | POA: Diagnosis not present

## 2022-01-03 DIAGNOSIS — E1165 Type 2 diabetes mellitus with hyperglycemia: Secondary | ICD-10-CM | POA: Diagnosis not present

## 2022-01-03 DIAGNOSIS — E782 Mixed hyperlipidemia: Secondary | ICD-10-CM | POA: Diagnosis not present

## 2022-01-03 DIAGNOSIS — F339 Major depressive disorder, recurrent, unspecified: Secondary | ICD-10-CM | POA: Diagnosis not present

## 2022-01-03 DIAGNOSIS — I890 Lymphedema, not elsewhere classified: Secondary | ICD-10-CM | POA: Diagnosis not present

## 2022-01-03 DIAGNOSIS — I38 Endocarditis, valve unspecified: Secondary | ICD-10-CM | POA: Diagnosis not present

## 2022-01-03 DIAGNOSIS — I110 Hypertensive heart disease with heart failure: Secondary | ICD-10-CM | POA: Diagnosis not present

## 2022-01-03 NOTE — Telephone Encounter (Signed)
Home Health Verbal Orders - Caller/Agency: Sharyn Lull from [facility] Number: [phone] Requesting OT and home health aide Frequency:  3 times month for 1 month and home health aide 2x4 starting on 11/27

## 2022-01-04 DIAGNOSIS — B952 Enterococcus as the cause of diseases classified elsewhere: Secondary | ICD-10-CM | POA: Diagnosis not present

## 2022-01-04 DIAGNOSIS — E1165 Type 2 diabetes mellitus with hyperglycemia: Secondary | ICD-10-CM | POA: Diagnosis not present

## 2022-01-04 DIAGNOSIS — I5042 Chronic combined systolic (congestive) and diastolic (congestive) heart failure: Secondary | ICD-10-CM | POA: Diagnosis not present

## 2022-01-04 DIAGNOSIS — I708 Atherosclerosis of other arteries: Secondary | ICD-10-CM | POA: Diagnosis not present

## 2022-01-04 DIAGNOSIS — I951 Orthostatic hypotension: Secondary | ICD-10-CM | POA: Diagnosis not present

## 2022-01-04 DIAGNOSIS — E782 Mixed hyperlipidemia: Secondary | ICD-10-CM | POA: Diagnosis not present

## 2022-01-04 DIAGNOSIS — I38 Endocarditis, valve unspecified: Secondary | ICD-10-CM | POA: Diagnosis not present

## 2022-01-04 DIAGNOSIS — T826XXA Infection and inflammatory reaction due to cardiac valve prosthesis, initial encounter: Secondary | ICD-10-CM | POA: Diagnosis not present

## 2022-01-04 DIAGNOSIS — I110 Hypertensive heart disease with heart failure: Secondary | ICD-10-CM | POA: Diagnosis not present

## 2022-01-04 DIAGNOSIS — I480 Paroxysmal atrial fibrillation: Secondary | ICD-10-CM | POA: Diagnosis not present

## 2022-01-04 DIAGNOSIS — F339 Major depressive disorder, recurrent, unspecified: Secondary | ICD-10-CM | POA: Diagnosis not present

## 2022-01-04 DIAGNOSIS — I890 Lymphedema, not elsewhere classified: Secondary | ICD-10-CM | POA: Diagnosis not present

## 2022-01-04 DIAGNOSIS — D649 Anemia, unspecified: Secondary | ICD-10-CM | POA: Diagnosis not present

## 2022-01-05 DIAGNOSIS — D649 Anemia, unspecified: Secondary | ICD-10-CM | POA: Diagnosis not present

## 2022-01-05 DIAGNOSIS — I890 Lymphedema, not elsewhere classified: Secondary | ICD-10-CM | POA: Diagnosis not present

## 2022-01-05 DIAGNOSIS — E1165 Type 2 diabetes mellitus with hyperglycemia: Secondary | ICD-10-CM | POA: Diagnosis not present

## 2022-01-05 DIAGNOSIS — E782 Mixed hyperlipidemia: Secondary | ICD-10-CM | POA: Diagnosis not present

## 2022-01-05 DIAGNOSIS — T826XXA Infection and inflammatory reaction due to cardiac valve prosthesis, initial encounter: Secondary | ICD-10-CM | POA: Diagnosis not present

## 2022-01-05 DIAGNOSIS — I5042 Chronic combined systolic (congestive) and diastolic (congestive) heart failure: Secondary | ICD-10-CM | POA: Diagnosis not present

## 2022-01-05 DIAGNOSIS — I951 Orthostatic hypotension: Secondary | ICD-10-CM | POA: Diagnosis not present

## 2022-01-05 DIAGNOSIS — I110 Hypertensive heart disease with heart failure: Secondary | ICD-10-CM | POA: Diagnosis not present

## 2022-01-05 DIAGNOSIS — F339 Major depressive disorder, recurrent, unspecified: Secondary | ICD-10-CM | POA: Diagnosis not present

## 2022-01-05 DIAGNOSIS — I708 Atherosclerosis of other arteries: Secondary | ICD-10-CM | POA: Diagnosis not present

## 2022-01-05 DIAGNOSIS — I38 Endocarditis, valve unspecified: Secondary | ICD-10-CM | POA: Diagnosis not present

## 2022-01-05 DIAGNOSIS — I480 Paroxysmal atrial fibrillation: Secondary | ICD-10-CM | POA: Diagnosis not present

## 2022-01-05 DIAGNOSIS — B952 Enterococcus as the cause of diseases classified elsewhere: Secondary | ICD-10-CM | POA: Diagnosis not present

## 2022-01-06 DIAGNOSIS — D649 Anemia, unspecified: Secondary | ICD-10-CM | POA: Diagnosis not present

## 2022-01-06 DIAGNOSIS — I38 Endocarditis, valve unspecified: Secondary | ICD-10-CM | POA: Diagnosis not present

## 2022-01-06 DIAGNOSIS — I480 Paroxysmal atrial fibrillation: Secondary | ICD-10-CM | POA: Diagnosis not present

## 2022-01-06 DIAGNOSIS — E782 Mixed hyperlipidemia: Secondary | ICD-10-CM | POA: Diagnosis not present

## 2022-01-06 DIAGNOSIS — I708 Atherosclerosis of other arteries: Secondary | ICD-10-CM | POA: Diagnosis not present

## 2022-01-06 DIAGNOSIS — I5042 Chronic combined systolic (congestive) and diastolic (congestive) heart failure: Secondary | ICD-10-CM | POA: Diagnosis not present

## 2022-01-06 DIAGNOSIS — B952 Enterococcus as the cause of diseases classified elsewhere: Secondary | ICD-10-CM | POA: Diagnosis not present

## 2022-01-06 DIAGNOSIS — I890 Lymphedema, not elsewhere classified: Secondary | ICD-10-CM | POA: Diagnosis not present

## 2022-01-06 DIAGNOSIS — T826XXA Infection and inflammatory reaction due to cardiac valve prosthesis, initial encounter: Secondary | ICD-10-CM | POA: Diagnosis not present

## 2022-01-06 DIAGNOSIS — F339 Major depressive disorder, recurrent, unspecified: Secondary | ICD-10-CM | POA: Diagnosis not present

## 2022-01-06 DIAGNOSIS — E1165 Type 2 diabetes mellitus with hyperglycemia: Secondary | ICD-10-CM | POA: Diagnosis not present

## 2022-01-06 DIAGNOSIS — I951 Orthostatic hypotension: Secondary | ICD-10-CM | POA: Diagnosis not present

## 2022-01-06 DIAGNOSIS — I110 Hypertensive heart disease with heart failure: Secondary | ICD-10-CM | POA: Diagnosis not present

## 2022-01-07 DIAGNOSIS — T826XXA Infection and inflammatory reaction due to cardiac valve prosthesis, initial encounter: Secondary | ICD-10-CM | POA: Diagnosis not present

## 2022-01-07 DIAGNOSIS — E782 Mixed hyperlipidemia: Secondary | ICD-10-CM | POA: Diagnosis not present

## 2022-01-07 DIAGNOSIS — I38 Endocarditis, valve unspecified: Secondary | ICD-10-CM | POA: Diagnosis not present

## 2022-01-07 DIAGNOSIS — I890 Lymphedema, not elsewhere classified: Secondary | ICD-10-CM | POA: Diagnosis not present

## 2022-01-07 DIAGNOSIS — E1165 Type 2 diabetes mellitus with hyperglycemia: Secondary | ICD-10-CM | POA: Diagnosis not present

## 2022-01-07 DIAGNOSIS — B952 Enterococcus as the cause of diseases classified elsewhere: Secondary | ICD-10-CM | POA: Diagnosis not present

## 2022-01-07 DIAGNOSIS — I5042 Chronic combined systolic (congestive) and diastolic (congestive) heart failure: Secondary | ICD-10-CM | POA: Diagnosis not present

## 2022-01-07 DIAGNOSIS — D649 Anemia, unspecified: Secondary | ICD-10-CM | POA: Diagnosis not present

## 2022-01-07 DIAGNOSIS — I110 Hypertensive heart disease with heart failure: Secondary | ICD-10-CM | POA: Diagnosis not present

## 2022-01-07 DIAGNOSIS — F339 Major depressive disorder, recurrent, unspecified: Secondary | ICD-10-CM | POA: Diagnosis not present

## 2022-01-07 DIAGNOSIS — I480 Paroxysmal atrial fibrillation: Secondary | ICD-10-CM | POA: Diagnosis not present

## 2022-01-07 DIAGNOSIS — I708 Atherosclerosis of other arteries: Secondary | ICD-10-CM | POA: Diagnosis not present

## 2022-01-07 DIAGNOSIS — I951 Orthostatic hypotension: Secondary | ICD-10-CM | POA: Diagnosis not present

## 2022-01-08 DIAGNOSIS — Z7984 Long term (current) use of oral hypoglycemic drugs: Secondary | ICD-10-CM | POA: Diagnosis not present

## 2022-01-08 DIAGNOSIS — E782 Mixed hyperlipidemia: Secondary | ICD-10-CM | POA: Diagnosis not present

## 2022-01-08 DIAGNOSIS — Z79899 Other long term (current) drug therapy: Secondary | ICD-10-CM | POA: Diagnosis not present

## 2022-01-08 DIAGNOSIS — D649 Anemia, unspecified: Secondary | ICD-10-CM | POA: Diagnosis not present

## 2022-01-08 DIAGNOSIS — I890 Lymphedema, not elsewhere classified: Secondary | ICD-10-CM | POA: Diagnosis not present

## 2022-01-08 DIAGNOSIS — I5042 Chronic combined systolic (congestive) and diastolic (congestive) heart failure: Secondary | ICD-10-CM | POA: Diagnosis not present

## 2022-01-08 DIAGNOSIS — I110 Hypertensive heart disease with heart failure: Secondary | ICD-10-CM | POA: Diagnosis not present

## 2022-01-08 DIAGNOSIS — R001 Bradycardia, unspecified: Secondary | ICD-10-CM | POA: Diagnosis not present

## 2022-01-08 DIAGNOSIS — J90 Pleural effusion, not elsewhere classified: Secondary | ICD-10-CM | POA: Diagnosis not present

## 2022-01-08 DIAGNOSIS — Z952 Presence of prosthetic heart valve: Secondary | ICD-10-CM | POA: Diagnosis not present

## 2022-01-08 DIAGNOSIS — T826XXA Infection and inflammatory reaction due to cardiac valve prosthesis, initial encounter: Secondary | ICD-10-CM | POA: Diagnosis not present

## 2022-01-08 DIAGNOSIS — J9811 Atelectasis: Secondary | ICD-10-CM | POA: Diagnosis not present

## 2022-01-08 DIAGNOSIS — I38 Endocarditis, valve unspecified: Secondary | ICD-10-CM | POA: Diagnosis not present

## 2022-01-08 DIAGNOSIS — E1165 Type 2 diabetes mellitus with hyperglycemia: Secondary | ICD-10-CM | POA: Diagnosis not present

## 2022-01-08 DIAGNOSIS — B952 Enterococcus as the cause of diseases classified elsewhere: Secondary | ICD-10-CM | POA: Diagnosis not present

## 2022-01-08 DIAGNOSIS — I708 Atherosclerosis of other arteries: Secondary | ICD-10-CM | POA: Diagnosis not present

## 2022-01-08 DIAGNOSIS — R9431 Abnormal electrocardiogram [ECG] [EKG]: Secondary | ICD-10-CM | POA: Diagnosis not present

## 2022-01-08 DIAGNOSIS — F339 Major depressive disorder, recurrent, unspecified: Secondary | ICD-10-CM | POA: Diagnosis not present

## 2022-01-08 DIAGNOSIS — Z7982 Long term (current) use of aspirin: Secondary | ICD-10-CM | POA: Diagnosis not present

## 2022-01-08 DIAGNOSIS — Z7901 Long term (current) use of anticoagulants: Secondary | ICD-10-CM | POA: Diagnosis not present

## 2022-01-08 DIAGNOSIS — Z86711 Personal history of pulmonary embolism: Secondary | ICD-10-CM | POA: Diagnosis not present

## 2022-01-08 DIAGNOSIS — I480 Paroxysmal atrial fibrillation: Secondary | ICD-10-CM | POA: Diagnosis not present

## 2022-01-08 DIAGNOSIS — I951 Orthostatic hypotension: Secondary | ICD-10-CM | POA: Diagnosis not present

## 2022-01-08 NOTE — Telephone Encounter (Signed)
Verbal orders given  

## 2022-01-09 DIAGNOSIS — I5042 Chronic combined systolic (congestive) and diastolic (congestive) heart failure: Secondary | ICD-10-CM | POA: Diagnosis not present

## 2022-01-09 DIAGNOSIS — I38 Endocarditis, valve unspecified: Secondary | ICD-10-CM | POA: Diagnosis not present

## 2022-01-09 DIAGNOSIS — B952 Enterococcus as the cause of diseases classified elsewhere: Secondary | ICD-10-CM | POA: Diagnosis not present

## 2022-01-09 DIAGNOSIS — I708 Atherosclerosis of other arteries: Secondary | ICD-10-CM | POA: Diagnosis not present

## 2022-01-09 DIAGNOSIS — T826XXA Infection and inflammatory reaction due to cardiac valve prosthesis, initial encounter: Secondary | ICD-10-CM | POA: Diagnosis not present

## 2022-01-09 DIAGNOSIS — E782 Mixed hyperlipidemia: Secondary | ICD-10-CM | POA: Diagnosis not present

## 2022-01-09 DIAGNOSIS — I951 Orthostatic hypotension: Secondary | ICD-10-CM | POA: Diagnosis not present

## 2022-01-09 DIAGNOSIS — I890 Lymphedema, not elsewhere classified: Secondary | ICD-10-CM | POA: Diagnosis not present

## 2022-01-09 DIAGNOSIS — F339 Major depressive disorder, recurrent, unspecified: Secondary | ICD-10-CM | POA: Diagnosis not present

## 2022-01-09 DIAGNOSIS — I480 Paroxysmal atrial fibrillation: Secondary | ICD-10-CM | POA: Diagnosis not present

## 2022-01-09 DIAGNOSIS — I110 Hypertensive heart disease with heart failure: Secondary | ICD-10-CM | POA: Diagnosis not present

## 2022-01-09 DIAGNOSIS — E1165 Type 2 diabetes mellitus with hyperglycemia: Secondary | ICD-10-CM | POA: Diagnosis not present

## 2022-01-09 DIAGNOSIS — D649 Anemia, unspecified: Secondary | ICD-10-CM | POA: Diagnosis not present

## 2022-01-10 DIAGNOSIS — E1165 Type 2 diabetes mellitus with hyperglycemia: Secondary | ICD-10-CM | POA: Diagnosis not present

## 2022-01-10 DIAGNOSIS — I951 Orthostatic hypotension: Secondary | ICD-10-CM | POA: Diagnosis not present

## 2022-01-10 DIAGNOSIS — I5042 Chronic combined systolic (congestive) and diastolic (congestive) heart failure: Secondary | ICD-10-CM | POA: Diagnosis not present

## 2022-01-10 DIAGNOSIS — I38 Endocarditis, valve unspecified: Secondary | ICD-10-CM | POA: Diagnosis not present

## 2022-01-10 DIAGNOSIS — I890 Lymphedema, not elsewhere classified: Secondary | ICD-10-CM | POA: Diagnosis not present

## 2022-01-10 DIAGNOSIS — I708 Atherosclerosis of other arteries: Secondary | ICD-10-CM | POA: Diagnosis not present

## 2022-01-10 DIAGNOSIS — I480 Paroxysmal atrial fibrillation: Secondary | ICD-10-CM | POA: Diagnosis not present

## 2022-01-10 DIAGNOSIS — B952 Enterococcus as the cause of diseases classified elsewhere: Secondary | ICD-10-CM | POA: Diagnosis not present

## 2022-01-10 DIAGNOSIS — F339 Major depressive disorder, recurrent, unspecified: Secondary | ICD-10-CM | POA: Diagnosis not present

## 2022-01-10 DIAGNOSIS — E782 Mixed hyperlipidemia: Secondary | ICD-10-CM | POA: Diagnosis not present

## 2022-01-10 DIAGNOSIS — T826XXA Infection and inflammatory reaction due to cardiac valve prosthesis, initial encounter: Secondary | ICD-10-CM | POA: Diagnosis not present

## 2022-01-10 DIAGNOSIS — I110 Hypertensive heart disease with heart failure: Secondary | ICD-10-CM | POA: Diagnosis not present

## 2022-01-10 DIAGNOSIS — D649 Anemia, unspecified: Secondary | ICD-10-CM | POA: Diagnosis not present

## 2022-01-11 DIAGNOSIS — E1165 Type 2 diabetes mellitus with hyperglycemia: Secondary | ICD-10-CM | POA: Diagnosis not present

## 2022-01-11 DIAGNOSIS — I708 Atherosclerosis of other arteries: Secondary | ICD-10-CM | POA: Diagnosis not present

## 2022-01-11 DIAGNOSIS — E782 Mixed hyperlipidemia: Secondary | ICD-10-CM | POA: Diagnosis not present

## 2022-01-11 DIAGNOSIS — I110 Hypertensive heart disease with heart failure: Secondary | ICD-10-CM | POA: Diagnosis not present

## 2022-01-11 DIAGNOSIS — I951 Orthostatic hypotension: Secondary | ICD-10-CM | POA: Diagnosis not present

## 2022-01-11 DIAGNOSIS — I38 Endocarditis, valve unspecified: Secondary | ICD-10-CM | POA: Diagnosis not present

## 2022-01-11 DIAGNOSIS — B952 Enterococcus as the cause of diseases classified elsewhere: Secondary | ICD-10-CM | POA: Diagnosis not present

## 2022-01-11 DIAGNOSIS — I890 Lymphedema, not elsewhere classified: Secondary | ICD-10-CM | POA: Diagnosis not present

## 2022-01-11 DIAGNOSIS — T826XXA Infection and inflammatory reaction due to cardiac valve prosthesis, initial encounter: Secondary | ICD-10-CM | POA: Diagnosis not present

## 2022-01-11 DIAGNOSIS — F339 Major depressive disorder, recurrent, unspecified: Secondary | ICD-10-CM | POA: Diagnosis not present

## 2022-01-11 DIAGNOSIS — I5042 Chronic combined systolic (congestive) and diastolic (congestive) heart failure: Secondary | ICD-10-CM | POA: Diagnosis not present

## 2022-01-11 DIAGNOSIS — I480 Paroxysmal atrial fibrillation: Secondary | ICD-10-CM | POA: Diagnosis not present

## 2022-01-11 DIAGNOSIS — D649 Anemia, unspecified: Secondary | ICD-10-CM | POA: Diagnosis not present

## 2022-01-12 DIAGNOSIS — T826XXA Infection and inflammatory reaction due to cardiac valve prosthesis, initial encounter: Secondary | ICD-10-CM | POA: Diagnosis not present

## 2022-01-12 DIAGNOSIS — I890 Lymphedema, not elsewhere classified: Secondary | ICD-10-CM | POA: Diagnosis not present

## 2022-01-12 DIAGNOSIS — F339 Major depressive disorder, recurrent, unspecified: Secondary | ICD-10-CM | POA: Diagnosis not present

## 2022-01-12 DIAGNOSIS — E782 Mixed hyperlipidemia: Secondary | ICD-10-CM | POA: Diagnosis not present

## 2022-01-12 DIAGNOSIS — I951 Orthostatic hypotension: Secondary | ICD-10-CM | POA: Diagnosis not present

## 2022-01-12 DIAGNOSIS — D649 Anemia, unspecified: Secondary | ICD-10-CM | POA: Diagnosis not present

## 2022-01-12 DIAGNOSIS — I5042 Chronic combined systolic (congestive) and diastolic (congestive) heart failure: Secondary | ICD-10-CM | POA: Diagnosis not present

## 2022-01-12 DIAGNOSIS — I708 Atherosclerosis of other arteries: Secondary | ICD-10-CM | POA: Diagnosis not present

## 2022-01-12 DIAGNOSIS — B952 Enterococcus as the cause of diseases classified elsewhere: Secondary | ICD-10-CM | POA: Diagnosis not present

## 2022-01-12 DIAGNOSIS — I110 Hypertensive heart disease with heart failure: Secondary | ICD-10-CM | POA: Diagnosis not present

## 2022-01-12 DIAGNOSIS — E1165 Type 2 diabetes mellitus with hyperglycemia: Secondary | ICD-10-CM | POA: Diagnosis not present

## 2022-01-12 DIAGNOSIS — I38 Endocarditis, valve unspecified: Secondary | ICD-10-CM | POA: Diagnosis not present

## 2022-01-12 DIAGNOSIS — I480 Paroxysmal atrial fibrillation: Secondary | ICD-10-CM | POA: Diagnosis not present

## 2022-01-13 DIAGNOSIS — B952 Enterococcus as the cause of diseases classified elsewhere: Secondary | ICD-10-CM | POA: Diagnosis not present

## 2022-01-13 DIAGNOSIS — I5042 Chronic combined systolic (congestive) and diastolic (congestive) heart failure: Secondary | ICD-10-CM | POA: Diagnosis not present

## 2022-01-13 DIAGNOSIS — F339 Major depressive disorder, recurrent, unspecified: Secondary | ICD-10-CM | POA: Diagnosis not present

## 2022-01-13 DIAGNOSIS — I951 Orthostatic hypotension: Secondary | ICD-10-CM | POA: Diagnosis not present

## 2022-01-13 DIAGNOSIS — E782 Mixed hyperlipidemia: Secondary | ICD-10-CM | POA: Diagnosis not present

## 2022-01-13 DIAGNOSIS — D649 Anemia, unspecified: Secondary | ICD-10-CM | POA: Diagnosis not present

## 2022-01-13 DIAGNOSIS — I38 Endocarditis, valve unspecified: Secondary | ICD-10-CM | POA: Diagnosis not present

## 2022-01-13 DIAGNOSIS — E1165 Type 2 diabetes mellitus with hyperglycemia: Secondary | ICD-10-CM | POA: Diagnosis not present

## 2022-01-13 DIAGNOSIS — I890 Lymphedema, not elsewhere classified: Secondary | ICD-10-CM | POA: Diagnosis not present

## 2022-01-13 DIAGNOSIS — T826XXA Infection and inflammatory reaction due to cardiac valve prosthesis, initial encounter: Secondary | ICD-10-CM | POA: Diagnosis not present

## 2022-01-13 DIAGNOSIS — I110 Hypertensive heart disease with heart failure: Secondary | ICD-10-CM | POA: Diagnosis not present

## 2022-01-13 DIAGNOSIS — I708 Atherosclerosis of other arteries: Secondary | ICD-10-CM | POA: Diagnosis not present

## 2022-01-13 DIAGNOSIS — I480 Paroxysmal atrial fibrillation: Secondary | ICD-10-CM | POA: Diagnosis not present

## 2022-01-14 DIAGNOSIS — E1165 Type 2 diabetes mellitus with hyperglycemia: Secondary | ICD-10-CM | POA: Diagnosis not present

## 2022-01-14 DIAGNOSIS — I708 Atherosclerosis of other arteries: Secondary | ICD-10-CM | POA: Diagnosis not present

## 2022-01-14 DIAGNOSIS — B952 Enterococcus as the cause of diseases classified elsewhere: Secondary | ICD-10-CM | POA: Diagnosis not present

## 2022-01-14 DIAGNOSIS — D649 Anemia, unspecified: Secondary | ICD-10-CM | POA: Diagnosis not present

## 2022-01-14 DIAGNOSIS — I890 Lymphedema, not elsewhere classified: Secondary | ICD-10-CM | POA: Diagnosis not present

## 2022-01-14 DIAGNOSIS — I38 Endocarditis, valve unspecified: Secondary | ICD-10-CM | POA: Diagnosis not present

## 2022-01-14 DIAGNOSIS — I480 Paroxysmal atrial fibrillation: Secondary | ICD-10-CM | POA: Diagnosis not present

## 2022-01-14 DIAGNOSIS — E782 Mixed hyperlipidemia: Secondary | ICD-10-CM | POA: Diagnosis not present

## 2022-01-14 DIAGNOSIS — I5042 Chronic combined systolic (congestive) and diastolic (congestive) heart failure: Secondary | ICD-10-CM | POA: Diagnosis not present

## 2022-01-14 DIAGNOSIS — I110 Hypertensive heart disease with heart failure: Secondary | ICD-10-CM | POA: Diagnosis not present

## 2022-01-14 DIAGNOSIS — F339 Major depressive disorder, recurrent, unspecified: Secondary | ICD-10-CM | POA: Diagnosis not present

## 2022-01-14 DIAGNOSIS — I951 Orthostatic hypotension: Secondary | ICD-10-CM | POA: Diagnosis not present

## 2022-01-14 DIAGNOSIS — T826XXA Infection and inflammatory reaction due to cardiac valve prosthesis, initial encounter: Secondary | ICD-10-CM | POA: Diagnosis not present

## 2022-01-15 DIAGNOSIS — Z952 Presence of prosthetic heart valve: Secondary | ICD-10-CM | POA: Diagnosis not present

## 2022-01-15 DIAGNOSIS — I708 Atherosclerosis of other arteries: Secondary | ICD-10-CM | POA: Diagnosis not present

## 2022-01-15 DIAGNOSIS — I480 Paroxysmal atrial fibrillation: Secondary | ICD-10-CM | POA: Diagnosis not present

## 2022-01-15 DIAGNOSIS — Z79899 Other long term (current) drug therapy: Secondary | ICD-10-CM | POA: Diagnosis not present

## 2022-01-15 DIAGNOSIS — F339 Major depressive disorder, recurrent, unspecified: Secondary | ICD-10-CM | POA: Diagnosis not present

## 2022-01-15 DIAGNOSIS — B952 Enterococcus as the cause of diseases classified elsewhere: Secondary | ICD-10-CM | POA: Diagnosis not present

## 2022-01-15 DIAGNOSIS — E1165 Type 2 diabetes mellitus with hyperglycemia: Secondary | ICD-10-CM | POA: Diagnosis not present

## 2022-01-15 DIAGNOSIS — R0789 Other chest pain: Secondary | ICD-10-CM | POA: Diagnosis not present

## 2022-01-15 DIAGNOSIS — I38 Endocarditis, valve unspecified: Secondary | ICD-10-CM | POA: Diagnosis not present

## 2022-01-15 DIAGNOSIS — T826XXA Infection and inflammatory reaction due to cardiac valve prosthesis, initial encounter: Secondary | ICD-10-CM | POA: Diagnosis not present

## 2022-01-15 DIAGNOSIS — E782 Mixed hyperlipidemia: Secondary | ICD-10-CM | POA: Diagnosis not present

## 2022-01-15 DIAGNOSIS — I5042 Chronic combined systolic (congestive) and diastolic (congestive) heart failure: Secondary | ICD-10-CM | POA: Diagnosis not present

## 2022-01-15 DIAGNOSIS — I890 Lymphedema, not elsewhere classified: Secondary | ICD-10-CM | POA: Diagnosis not present

## 2022-01-15 DIAGNOSIS — D649 Anemia, unspecified: Secondary | ICD-10-CM | POA: Diagnosis not present

## 2022-01-15 DIAGNOSIS — I82B21 Chronic embolism and thrombosis of right subclavian vein: Secondary | ICD-10-CM | POA: Diagnosis not present

## 2022-01-15 DIAGNOSIS — G510 Bell's palsy: Secondary | ICD-10-CM | POA: Diagnosis not present

## 2022-01-15 DIAGNOSIS — Z1331 Encounter for screening for depression: Secondary | ICD-10-CM | POA: Diagnosis not present

## 2022-01-15 DIAGNOSIS — J90 Pleural effusion, not elsewhere classified: Secondary | ICD-10-CM | POA: Diagnosis not present

## 2022-01-15 DIAGNOSIS — I5022 Chronic systolic (congestive) heart failure: Secondary | ICD-10-CM | POA: Diagnosis not present

## 2022-01-15 DIAGNOSIS — Z8542 Personal history of malignant neoplasm of other parts of uterus: Secondary | ICD-10-CM | POA: Diagnosis not present

## 2022-01-15 DIAGNOSIS — I330 Acute and subacute infective endocarditis: Secondary | ICD-10-CM | POA: Diagnosis not present

## 2022-01-15 DIAGNOSIS — I110 Hypertensive heart disease with heart failure: Secondary | ICD-10-CM | POA: Diagnosis not present

## 2022-01-15 DIAGNOSIS — I951 Orthostatic hypotension: Secondary | ICD-10-CM | POA: Diagnosis not present

## 2022-01-16 ENCOUNTER — Ambulatory Visit: Payer: Medicare HMO | Admitting: Family Medicine

## 2022-01-16 DIAGNOSIS — T826XXA Infection and inflammatory reaction due to cardiac valve prosthesis, initial encounter: Secondary | ICD-10-CM | POA: Diagnosis not present

## 2022-01-16 DIAGNOSIS — I890 Lymphedema, not elsewhere classified: Secondary | ICD-10-CM | POA: Diagnosis not present

## 2022-01-16 DIAGNOSIS — D649 Anemia, unspecified: Secondary | ICD-10-CM | POA: Diagnosis not present

## 2022-01-16 DIAGNOSIS — I708 Atherosclerosis of other arteries: Secondary | ICD-10-CM | POA: Diagnosis not present

## 2022-01-16 DIAGNOSIS — I110 Hypertensive heart disease with heart failure: Secondary | ICD-10-CM | POA: Diagnosis not present

## 2022-01-16 DIAGNOSIS — I5042 Chronic combined systolic (congestive) and diastolic (congestive) heart failure: Secondary | ICD-10-CM | POA: Diagnosis not present

## 2022-01-16 DIAGNOSIS — F339 Major depressive disorder, recurrent, unspecified: Secondary | ICD-10-CM | POA: Diagnosis not present

## 2022-01-16 DIAGNOSIS — B952 Enterococcus as the cause of diseases classified elsewhere: Secondary | ICD-10-CM | POA: Diagnosis not present

## 2022-01-16 DIAGNOSIS — E1165 Type 2 diabetes mellitus with hyperglycemia: Secondary | ICD-10-CM | POA: Diagnosis not present

## 2022-01-16 DIAGNOSIS — E782 Mixed hyperlipidemia: Secondary | ICD-10-CM | POA: Diagnosis not present

## 2022-01-16 DIAGNOSIS — I480 Paroxysmal atrial fibrillation: Secondary | ICD-10-CM | POA: Diagnosis not present

## 2022-01-16 DIAGNOSIS — I951 Orthostatic hypotension: Secondary | ICD-10-CM | POA: Diagnosis not present

## 2022-01-16 DIAGNOSIS — I38 Endocarditis, valve unspecified: Secondary | ICD-10-CM | POA: Diagnosis not present

## 2022-01-17 DIAGNOSIS — I480 Paroxysmal atrial fibrillation: Secondary | ICD-10-CM | POA: Diagnosis not present

## 2022-01-17 DIAGNOSIS — I708 Atherosclerosis of other arteries: Secondary | ICD-10-CM | POA: Diagnosis not present

## 2022-01-17 DIAGNOSIS — I38 Endocarditis, valve unspecified: Secondary | ICD-10-CM | POA: Diagnosis not present

## 2022-01-17 DIAGNOSIS — E782 Mixed hyperlipidemia: Secondary | ICD-10-CM | POA: Diagnosis not present

## 2022-01-17 DIAGNOSIS — I110 Hypertensive heart disease with heart failure: Secondary | ICD-10-CM | POA: Diagnosis not present

## 2022-01-17 DIAGNOSIS — E1165 Type 2 diabetes mellitus with hyperglycemia: Secondary | ICD-10-CM | POA: Diagnosis not present

## 2022-01-17 DIAGNOSIS — I951 Orthostatic hypotension: Secondary | ICD-10-CM | POA: Diagnosis not present

## 2022-01-17 DIAGNOSIS — D649 Anemia, unspecified: Secondary | ICD-10-CM | POA: Diagnosis not present

## 2022-01-17 DIAGNOSIS — F339 Major depressive disorder, recurrent, unspecified: Secondary | ICD-10-CM | POA: Diagnosis not present

## 2022-01-17 DIAGNOSIS — T826XXA Infection and inflammatory reaction due to cardiac valve prosthesis, initial encounter: Secondary | ICD-10-CM | POA: Diagnosis not present

## 2022-01-17 DIAGNOSIS — I5042 Chronic combined systolic (congestive) and diastolic (congestive) heart failure: Secondary | ICD-10-CM | POA: Diagnosis not present

## 2022-01-17 DIAGNOSIS — B952 Enterococcus as the cause of diseases classified elsewhere: Secondary | ICD-10-CM | POA: Diagnosis not present

## 2022-01-17 DIAGNOSIS — I890 Lymphedema, not elsewhere classified: Secondary | ICD-10-CM | POA: Diagnosis not present

## 2022-01-18 DIAGNOSIS — B952 Enterococcus as the cause of diseases classified elsewhere: Secondary | ICD-10-CM | POA: Diagnosis not present

## 2022-01-18 DIAGNOSIS — I890 Lymphedema, not elsewhere classified: Secondary | ICD-10-CM | POA: Diagnosis not present

## 2022-01-18 DIAGNOSIS — I951 Orthostatic hypotension: Secondary | ICD-10-CM | POA: Diagnosis not present

## 2022-01-18 DIAGNOSIS — T826XXA Infection and inflammatory reaction due to cardiac valve prosthesis, initial encounter: Secondary | ICD-10-CM | POA: Diagnosis not present

## 2022-01-18 DIAGNOSIS — I708 Atherosclerosis of other arteries: Secondary | ICD-10-CM | POA: Diagnosis not present

## 2022-01-18 DIAGNOSIS — I480 Paroxysmal atrial fibrillation: Secondary | ICD-10-CM | POA: Diagnosis not present

## 2022-01-18 DIAGNOSIS — E1165 Type 2 diabetes mellitus with hyperglycemia: Secondary | ICD-10-CM | POA: Diagnosis not present

## 2022-01-18 DIAGNOSIS — D649 Anemia, unspecified: Secondary | ICD-10-CM | POA: Diagnosis not present

## 2022-01-18 DIAGNOSIS — F339 Major depressive disorder, recurrent, unspecified: Secondary | ICD-10-CM | POA: Diagnosis not present

## 2022-01-18 DIAGNOSIS — E782 Mixed hyperlipidemia: Secondary | ICD-10-CM | POA: Diagnosis not present

## 2022-01-18 DIAGNOSIS — I110 Hypertensive heart disease with heart failure: Secondary | ICD-10-CM | POA: Diagnosis not present

## 2022-01-18 DIAGNOSIS — I5042 Chronic combined systolic (congestive) and diastolic (congestive) heart failure: Secondary | ICD-10-CM | POA: Diagnosis not present

## 2022-01-18 DIAGNOSIS — I38 Endocarditis, valve unspecified: Secondary | ICD-10-CM | POA: Diagnosis not present

## 2022-01-19 DIAGNOSIS — I890 Lymphedema, not elsewhere classified: Secondary | ICD-10-CM | POA: Diagnosis not present

## 2022-01-19 DIAGNOSIS — I708 Atherosclerosis of other arteries: Secondary | ICD-10-CM | POA: Diagnosis not present

## 2022-01-19 DIAGNOSIS — D649 Anemia, unspecified: Secondary | ICD-10-CM | POA: Diagnosis not present

## 2022-01-19 DIAGNOSIS — E782 Mixed hyperlipidemia: Secondary | ICD-10-CM | POA: Diagnosis not present

## 2022-01-19 DIAGNOSIS — E1165 Type 2 diabetes mellitus with hyperglycemia: Secondary | ICD-10-CM | POA: Diagnosis not present

## 2022-01-19 DIAGNOSIS — B952 Enterococcus as the cause of diseases classified elsewhere: Secondary | ICD-10-CM | POA: Diagnosis not present

## 2022-01-19 DIAGNOSIS — T826XXA Infection and inflammatory reaction due to cardiac valve prosthesis, initial encounter: Secondary | ICD-10-CM | POA: Diagnosis not present

## 2022-01-19 DIAGNOSIS — I5042 Chronic combined systolic (congestive) and diastolic (congestive) heart failure: Secondary | ICD-10-CM | POA: Diagnosis not present

## 2022-01-19 DIAGNOSIS — I38 Endocarditis, valve unspecified: Secondary | ICD-10-CM | POA: Diagnosis not present

## 2022-01-19 DIAGNOSIS — Y712 Prosthetic and other implants, materials and accessory cardiovascular devices associated with adverse incidents: Secondary | ICD-10-CM | POA: Diagnosis not present

## 2022-01-19 DIAGNOSIS — F339 Major depressive disorder, recurrent, unspecified: Secondary | ICD-10-CM | POA: Diagnosis not present

## 2022-01-19 DIAGNOSIS — I480 Paroxysmal atrial fibrillation: Secondary | ICD-10-CM | POA: Diagnosis not present

## 2022-01-19 DIAGNOSIS — I110 Hypertensive heart disease with heart failure: Secondary | ICD-10-CM | POA: Diagnosis not present

## 2022-01-19 DIAGNOSIS — I951 Orthostatic hypotension: Secondary | ICD-10-CM | POA: Diagnosis not present

## 2022-01-20 DIAGNOSIS — T826XXA Infection and inflammatory reaction due to cardiac valve prosthesis, initial encounter: Secondary | ICD-10-CM | POA: Diagnosis not present

## 2022-01-20 DIAGNOSIS — I951 Orthostatic hypotension: Secondary | ICD-10-CM | POA: Diagnosis not present

## 2022-01-20 DIAGNOSIS — I890 Lymphedema, not elsewhere classified: Secondary | ICD-10-CM | POA: Diagnosis not present

## 2022-01-20 DIAGNOSIS — D649 Anemia, unspecified: Secondary | ICD-10-CM | POA: Diagnosis not present

## 2022-01-20 DIAGNOSIS — B952 Enterococcus as the cause of diseases classified elsewhere: Secondary | ICD-10-CM | POA: Diagnosis not present

## 2022-01-20 DIAGNOSIS — I708 Atherosclerosis of other arteries: Secondary | ICD-10-CM | POA: Diagnosis not present

## 2022-01-20 DIAGNOSIS — F339 Major depressive disorder, recurrent, unspecified: Secondary | ICD-10-CM | POA: Diagnosis not present

## 2022-01-20 DIAGNOSIS — E782 Mixed hyperlipidemia: Secondary | ICD-10-CM | POA: Diagnosis not present

## 2022-01-20 DIAGNOSIS — E1165 Type 2 diabetes mellitus with hyperglycemia: Secondary | ICD-10-CM | POA: Diagnosis not present

## 2022-01-20 DIAGNOSIS — I110 Hypertensive heart disease with heart failure: Secondary | ICD-10-CM | POA: Diagnosis not present

## 2022-01-20 DIAGNOSIS — I5042 Chronic combined systolic (congestive) and diastolic (congestive) heart failure: Secondary | ICD-10-CM | POA: Diagnosis not present

## 2022-01-20 DIAGNOSIS — I38 Endocarditis, valve unspecified: Secondary | ICD-10-CM | POA: Diagnosis not present

## 2022-01-20 DIAGNOSIS — I480 Paroxysmal atrial fibrillation: Secondary | ICD-10-CM | POA: Diagnosis not present

## 2022-01-21 DIAGNOSIS — I110 Hypertensive heart disease with heart failure: Secondary | ICD-10-CM | POA: Diagnosis not present

## 2022-01-21 DIAGNOSIS — E1165 Type 2 diabetes mellitus with hyperglycemia: Secondary | ICD-10-CM | POA: Diagnosis not present

## 2022-01-21 DIAGNOSIS — I38 Endocarditis, valve unspecified: Secondary | ICD-10-CM | POA: Diagnosis not present

## 2022-01-21 DIAGNOSIS — I480 Paroxysmal atrial fibrillation: Secondary | ICD-10-CM | POA: Diagnosis not present

## 2022-01-21 DIAGNOSIS — I5042 Chronic combined systolic (congestive) and diastolic (congestive) heart failure: Secondary | ICD-10-CM | POA: Diagnosis not present

## 2022-01-21 DIAGNOSIS — E782 Mixed hyperlipidemia: Secondary | ICD-10-CM | POA: Diagnosis not present

## 2022-01-21 DIAGNOSIS — I951 Orthostatic hypotension: Secondary | ICD-10-CM | POA: Diagnosis not present

## 2022-01-21 DIAGNOSIS — I890 Lymphedema, not elsewhere classified: Secondary | ICD-10-CM | POA: Diagnosis not present

## 2022-01-21 DIAGNOSIS — D649 Anemia, unspecified: Secondary | ICD-10-CM | POA: Diagnosis not present

## 2022-01-21 DIAGNOSIS — I708 Atherosclerosis of other arteries: Secondary | ICD-10-CM | POA: Diagnosis not present

## 2022-01-21 DIAGNOSIS — B952 Enterococcus as the cause of diseases classified elsewhere: Secondary | ICD-10-CM | POA: Diagnosis not present

## 2022-01-21 DIAGNOSIS — F339 Major depressive disorder, recurrent, unspecified: Secondary | ICD-10-CM | POA: Diagnosis not present

## 2022-01-21 DIAGNOSIS — T826XXA Infection and inflammatory reaction due to cardiac valve prosthesis, initial encounter: Secondary | ICD-10-CM | POA: Diagnosis not present

## 2022-01-22 DIAGNOSIS — I38 Endocarditis, valve unspecified: Secondary | ICD-10-CM | POA: Diagnosis not present

## 2022-01-22 DIAGNOSIS — T826XXA Infection and inflammatory reaction due to cardiac valve prosthesis, initial encounter: Secondary | ICD-10-CM | POA: Diagnosis not present

## 2022-01-22 DIAGNOSIS — E1165 Type 2 diabetes mellitus with hyperglycemia: Secondary | ICD-10-CM | POA: Diagnosis not present

## 2022-01-22 DIAGNOSIS — I890 Lymphedema, not elsewhere classified: Secondary | ICD-10-CM | POA: Diagnosis not present

## 2022-01-22 DIAGNOSIS — I708 Atherosclerosis of other arteries: Secondary | ICD-10-CM | POA: Diagnosis not present

## 2022-01-22 DIAGNOSIS — D649 Anemia, unspecified: Secondary | ICD-10-CM | POA: Diagnosis not present

## 2022-01-22 DIAGNOSIS — F339 Major depressive disorder, recurrent, unspecified: Secondary | ICD-10-CM | POA: Diagnosis not present

## 2022-01-22 DIAGNOSIS — B952 Enterococcus as the cause of diseases classified elsewhere: Secondary | ICD-10-CM | POA: Diagnosis not present

## 2022-01-22 DIAGNOSIS — E782 Mixed hyperlipidemia: Secondary | ICD-10-CM | POA: Diagnosis not present

## 2022-01-22 DIAGNOSIS — I951 Orthostatic hypotension: Secondary | ICD-10-CM | POA: Diagnosis not present

## 2022-01-22 DIAGNOSIS — I5042 Chronic combined systolic (congestive) and diastolic (congestive) heart failure: Secondary | ICD-10-CM | POA: Diagnosis not present

## 2022-01-22 DIAGNOSIS — I480 Paroxysmal atrial fibrillation: Secondary | ICD-10-CM | POA: Diagnosis not present

## 2022-01-22 DIAGNOSIS — I110 Hypertensive heart disease with heart failure: Secondary | ICD-10-CM | POA: Diagnosis not present

## 2022-01-23 ENCOUNTER — Ambulatory Visit: Payer: Medicare HMO | Admitting: Family Medicine

## 2022-01-23 DIAGNOSIS — E782 Mixed hyperlipidemia: Secondary | ICD-10-CM | POA: Diagnosis not present

## 2022-01-23 DIAGNOSIS — I110 Hypertensive heart disease with heart failure: Secondary | ICD-10-CM | POA: Diagnosis not present

## 2022-01-23 DIAGNOSIS — I708 Atherosclerosis of other arteries: Secondary | ICD-10-CM | POA: Diagnosis not present

## 2022-01-23 DIAGNOSIS — I5042 Chronic combined systolic (congestive) and diastolic (congestive) heart failure: Secondary | ICD-10-CM | POA: Diagnosis not present

## 2022-01-23 DIAGNOSIS — E1165 Type 2 diabetes mellitus with hyperglycemia: Secondary | ICD-10-CM | POA: Diagnosis not present

## 2022-01-23 DIAGNOSIS — B952 Enterococcus as the cause of diseases classified elsewhere: Secondary | ICD-10-CM | POA: Diagnosis not present

## 2022-01-23 DIAGNOSIS — I890 Lymphedema, not elsewhere classified: Secondary | ICD-10-CM | POA: Diagnosis not present

## 2022-01-23 DIAGNOSIS — I951 Orthostatic hypotension: Secondary | ICD-10-CM | POA: Diagnosis not present

## 2022-01-23 DIAGNOSIS — I480 Paroxysmal atrial fibrillation: Secondary | ICD-10-CM | POA: Diagnosis not present

## 2022-01-23 DIAGNOSIS — D649 Anemia, unspecified: Secondary | ICD-10-CM | POA: Diagnosis not present

## 2022-01-23 DIAGNOSIS — T826XXA Infection and inflammatory reaction due to cardiac valve prosthesis, initial encounter: Secondary | ICD-10-CM | POA: Diagnosis not present

## 2022-01-23 DIAGNOSIS — I38 Endocarditis, valve unspecified: Secondary | ICD-10-CM | POA: Diagnosis not present

## 2022-01-23 DIAGNOSIS — F339 Major depressive disorder, recurrent, unspecified: Secondary | ICD-10-CM | POA: Diagnosis not present

## 2022-01-24 ENCOUNTER — Ambulatory Visit: Payer: Medicare HMO | Admitting: Family Medicine

## 2022-01-24 DIAGNOSIS — D649 Anemia, unspecified: Secondary | ICD-10-CM | POA: Diagnosis not present

## 2022-01-24 DIAGNOSIS — I951 Orthostatic hypotension: Secondary | ICD-10-CM | POA: Diagnosis not present

## 2022-01-24 DIAGNOSIS — I110 Hypertensive heart disease with heart failure: Secondary | ICD-10-CM | POA: Diagnosis not present

## 2022-01-24 DIAGNOSIS — I38 Endocarditis, valve unspecified: Secondary | ICD-10-CM | POA: Diagnosis not present

## 2022-01-24 DIAGNOSIS — T826XXA Infection and inflammatory reaction due to cardiac valve prosthesis, initial encounter: Secondary | ICD-10-CM | POA: Diagnosis not present

## 2022-01-24 DIAGNOSIS — E1165 Type 2 diabetes mellitus with hyperglycemia: Secondary | ICD-10-CM | POA: Diagnosis not present

## 2022-01-24 DIAGNOSIS — I480 Paroxysmal atrial fibrillation: Secondary | ICD-10-CM | POA: Diagnosis not present

## 2022-01-24 DIAGNOSIS — E782 Mixed hyperlipidemia: Secondary | ICD-10-CM | POA: Diagnosis not present

## 2022-01-24 DIAGNOSIS — I890 Lymphedema, not elsewhere classified: Secondary | ICD-10-CM | POA: Diagnosis not present

## 2022-01-24 DIAGNOSIS — B952 Enterococcus as the cause of diseases classified elsewhere: Secondary | ICD-10-CM | POA: Diagnosis not present

## 2022-01-24 DIAGNOSIS — F339 Major depressive disorder, recurrent, unspecified: Secondary | ICD-10-CM | POA: Diagnosis not present

## 2022-01-24 DIAGNOSIS — I708 Atherosclerosis of other arteries: Secondary | ICD-10-CM | POA: Diagnosis not present

## 2022-01-24 DIAGNOSIS — I5042 Chronic combined systolic (congestive) and diastolic (congestive) heart failure: Secondary | ICD-10-CM | POA: Diagnosis not present

## 2022-01-24 DIAGNOSIS — Z0189 Encounter for other specified special examinations: Secondary | ICD-10-CM | POA: Diagnosis not present

## 2022-01-25 DIAGNOSIS — E1165 Type 2 diabetes mellitus with hyperglycemia: Secondary | ICD-10-CM | POA: Diagnosis not present

## 2022-01-25 DIAGNOSIS — I951 Orthostatic hypotension: Secondary | ICD-10-CM | POA: Diagnosis not present

## 2022-01-25 DIAGNOSIS — I38 Endocarditis, valve unspecified: Secondary | ICD-10-CM | POA: Diagnosis not present

## 2022-01-25 DIAGNOSIS — I480 Paroxysmal atrial fibrillation: Secondary | ICD-10-CM | POA: Diagnosis not present

## 2022-01-25 DIAGNOSIS — I890 Lymphedema, not elsewhere classified: Secondary | ICD-10-CM | POA: Diagnosis not present

## 2022-01-25 DIAGNOSIS — I110 Hypertensive heart disease with heart failure: Secondary | ICD-10-CM | POA: Diagnosis not present

## 2022-01-25 DIAGNOSIS — E782 Mixed hyperlipidemia: Secondary | ICD-10-CM | POA: Diagnosis not present

## 2022-01-25 DIAGNOSIS — D649 Anemia, unspecified: Secondary | ICD-10-CM | POA: Diagnosis not present

## 2022-01-25 DIAGNOSIS — B952 Enterococcus as the cause of diseases classified elsewhere: Secondary | ICD-10-CM | POA: Diagnosis not present

## 2022-01-25 DIAGNOSIS — I708 Atherosclerosis of other arteries: Secondary | ICD-10-CM | POA: Diagnosis not present

## 2022-01-25 DIAGNOSIS — I5042 Chronic combined systolic (congestive) and diastolic (congestive) heart failure: Secondary | ICD-10-CM | POA: Diagnosis not present

## 2022-01-25 DIAGNOSIS — F339 Major depressive disorder, recurrent, unspecified: Secondary | ICD-10-CM | POA: Diagnosis not present

## 2022-01-25 DIAGNOSIS — T826XXA Infection and inflammatory reaction due to cardiac valve prosthesis, initial encounter: Secondary | ICD-10-CM | POA: Diagnosis not present

## 2022-01-26 DIAGNOSIS — B952 Enterococcus as the cause of diseases classified elsewhere: Secondary | ICD-10-CM | POA: Diagnosis not present

## 2022-01-26 DIAGNOSIS — I480 Paroxysmal atrial fibrillation: Secondary | ICD-10-CM | POA: Diagnosis not present

## 2022-01-26 DIAGNOSIS — I38 Endocarditis, valve unspecified: Secondary | ICD-10-CM | POA: Diagnosis not present

## 2022-01-26 DIAGNOSIS — I951 Orthostatic hypotension: Secondary | ICD-10-CM | POA: Diagnosis not present

## 2022-01-26 DIAGNOSIS — I5042 Chronic combined systolic (congestive) and diastolic (congestive) heart failure: Secondary | ICD-10-CM | POA: Diagnosis not present

## 2022-01-26 DIAGNOSIS — I110 Hypertensive heart disease with heart failure: Secondary | ICD-10-CM | POA: Diagnosis not present

## 2022-01-26 DIAGNOSIS — F339 Major depressive disorder, recurrent, unspecified: Secondary | ICD-10-CM | POA: Diagnosis not present

## 2022-01-26 DIAGNOSIS — T826XXA Infection and inflammatory reaction due to cardiac valve prosthesis, initial encounter: Secondary | ICD-10-CM | POA: Diagnosis not present

## 2022-01-26 DIAGNOSIS — I890 Lymphedema, not elsewhere classified: Secondary | ICD-10-CM | POA: Diagnosis not present

## 2022-01-26 DIAGNOSIS — I708 Atherosclerosis of other arteries: Secondary | ICD-10-CM | POA: Diagnosis not present

## 2022-01-26 DIAGNOSIS — E782 Mixed hyperlipidemia: Secondary | ICD-10-CM | POA: Diagnosis not present

## 2022-01-26 DIAGNOSIS — E1165 Type 2 diabetes mellitus with hyperglycemia: Secondary | ICD-10-CM | POA: Diagnosis not present

## 2022-01-26 DIAGNOSIS — D649 Anemia, unspecified: Secondary | ICD-10-CM | POA: Diagnosis not present

## 2022-01-27 DIAGNOSIS — T826XXA Infection and inflammatory reaction due to cardiac valve prosthesis, initial encounter: Secondary | ICD-10-CM | POA: Diagnosis not present

## 2022-01-27 DIAGNOSIS — E782 Mixed hyperlipidemia: Secondary | ICD-10-CM | POA: Diagnosis not present

## 2022-01-27 DIAGNOSIS — I480 Paroxysmal atrial fibrillation: Secondary | ICD-10-CM | POA: Diagnosis not present

## 2022-01-27 DIAGNOSIS — I5042 Chronic combined systolic (congestive) and diastolic (congestive) heart failure: Secondary | ICD-10-CM | POA: Diagnosis not present

## 2022-01-27 DIAGNOSIS — I951 Orthostatic hypotension: Secondary | ICD-10-CM | POA: Diagnosis not present

## 2022-01-27 DIAGNOSIS — I890 Lymphedema, not elsewhere classified: Secondary | ICD-10-CM | POA: Diagnosis not present

## 2022-01-27 DIAGNOSIS — E1165 Type 2 diabetes mellitus with hyperglycemia: Secondary | ICD-10-CM | POA: Diagnosis not present

## 2022-01-27 DIAGNOSIS — I708 Atherosclerosis of other arteries: Secondary | ICD-10-CM | POA: Diagnosis not present

## 2022-01-27 DIAGNOSIS — I38 Endocarditis, valve unspecified: Secondary | ICD-10-CM | POA: Diagnosis not present

## 2022-01-27 DIAGNOSIS — I110 Hypertensive heart disease with heart failure: Secondary | ICD-10-CM | POA: Diagnosis not present

## 2022-01-27 DIAGNOSIS — B952 Enterococcus as the cause of diseases classified elsewhere: Secondary | ICD-10-CM | POA: Diagnosis not present

## 2022-01-27 DIAGNOSIS — D649 Anemia, unspecified: Secondary | ICD-10-CM | POA: Diagnosis not present

## 2022-01-27 DIAGNOSIS — F339 Major depressive disorder, recurrent, unspecified: Secondary | ICD-10-CM | POA: Diagnosis not present

## 2022-01-28 DIAGNOSIS — I890 Lymphedema, not elsewhere classified: Secondary | ICD-10-CM | POA: Diagnosis not present

## 2022-01-28 DIAGNOSIS — I951 Orthostatic hypotension: Secondary | ICD-10-CM | POA: Diagnosis not present

## 2022-01-28 DIAGNOSIS — B952 Enterococcus as the cause of diseases classified elsewhere: Secondary | ICD-10-CM | POA: Diagnosis not present

## 2022-01-28 DIAGNOSIS — I708 Atherosclerosis of other arteries: Secondary | ICD-10-CM | POA: Diagnosis not present

## 2022-01-28 DIAGNOSIS — I110 Hypertensive heart disease with heart failure: Secondary | ICD-10-CM | POA: Diagnosis not present

## 2022-01-28 DIAGNOSIS — I38 Endocarditis, valve unspecified: Secondary | ICD-10-CM | POA: Diagnosis not present

## 2022-01-28 DIAGNOSIS — I5042 Chronic combined systolic (congestive) and diastolic (congestive) heart failure: Secondary | ICD-10-CM | POA: Diagnosis not present

## 2022-01-28 DIAGNOSIS — F339 Major depressive disorder, recurrent, unspecified: Secondary | ICD-10-CM | POA: Diagnosis not present

## 2022-01-28 DIAGNOSIS — I480 Paroxysmal atrial fibrillation: Secondary | ICD-10-CM | POA: Diagnosis not present

## 2022-01-28 DIAGNOSIS — T826XXA Infection and inflammatory reaction due to cardiac valve prosthesis, initial encounter: Secondary | ICD-10-CM | POA: Diagnosis not present

## 2022-01-28 DIAGNOSIS — E782 Mixed hyperlipidemia: Secondary | ICD-10-CM | POA: Diagnosis not present

## 2022-01-28 DIAGNOSIS — E1165 Type 2 diabetes mellitus with hyperglycemia: Secondary | ICD-10-CM | POA: Diagnosis not present

## 2022-01-28 DIAGNOSIS — D649 Anemia, unspecified: Secondary | ICD-10-CM | POA: Diagnosis not present

## 2022-01-29 DIAGNOSIS — I38 Endocarditis, valve unspecified: Secondary | ICD-10-CM | POA: Diagnosis not present

## 2022-01-29 DIAGNOSIS — I5042 Chronic combined systolic (congestive) and diastolic (congestive) heart failure: Secondary | ICD-10-CM | POA: Diagnosis not present

## 2022-01-29 DIAGNOSIS — I708 Atherosclerosis of other arteries: Secondary | ICD-10-CM | POA: Diagnosis not present

## 2022-01-29 DIAGNOSIS — I951 Orthostatic hypotension: Secondary | ICD-10-CM | POA: Diagnosis not present

## 2022-01-29 DIAGNOSIS — E1165 Type 2 diabetes mellitus with hyperglycemia: Secondary | ICD-10-CM | POA: Diagnosis not present

## 2022-01-29 DIAGNOSIS — T826XXA Infection and inflammatory reaction due to cardiac valve prosthesis, initial encounter: Secondary | ICD-10-CM | POA: Diagnosis not present

## 2022-01-29 DIAGNOSIS — F339 Major depressive disorder, recurrent, unspecified: Secondary | ICD-10-CM | POA: Diagnosis not present

## 2022-01-29 DIAGNOSIS — I110 Hypertensive heart disease with heart failure: Secondary | ICD-10-CM | POA: Diagnosis not present

## 2022-01-29 DIAGNOSIS — I480 Paroxysmal atrial fibrillation: Secondary | ICD-10-CM | POA: Diagnosis not present

## 2022-01-29 DIAGNOSIS — E782 Mixed hyperlipidemia: Secondary | ICD-10-CM | POA: Diagnosis not present

## 2022-01-29 DIAGNOSIS — D649 Anemia, unspecified: Secondary | ICD-10-CM | POA: Diagnosis not present

## 2022-01-29 DIAGNOSIS — B952 Enterococcus as the cause of diseases classified elsewhere: Secondary | ICD-10-CM | POA: Diagnosis not present

## 2022-01-29 DIAGNOSIS — I890 Lymphedema, not elsewhere classified: Secondary | ICD-10-CM | POA: Diagnosis not present

## 2022-01-30 DIAGNOSIS — I38 Endocarditis, valve unspecified: Secondary | ICD-10-CM | POA: Diagnosis not present

## 2022-01-30 DIAGNOSIS — F339 Major depressive disorder, recurrent, unspecified: Secondary | ICD-10-CM | POA: Diagnosis not present

## 2022-01-30 DIAGNOSIS — B952 Enterococcus as the cause of diseases classified elsewhere: Secondary | ICD-10-CM | POA: Diagnosis not present

## 2022-01-30 DIAGNOSIS — I110 Hypertensive heart disease with heart failure: Secondary | ICD-10-CM | POA: Diagnosis not present

## 2022-01-30 DIAGNOSIS — I708 Atherosclerosis of other arteries: Secondary | ICD-10-CM | POA: Diagnosis not present

## 2022-01-30 DIAGNOSIS — D649 Anemia, unspecified: Secondary | ICD-10-CM | POA: Diagnosis not present

## 2022-01-30 DIAGNOSIS — I480 Paroxysmal atrial fibrillation: Secondary | ICD-10-CM | POA: Diagnosis not present

## 2022-01-30 DIAGNOSIS — I5042 Chronic combined systolic (congestive) and diastolic (congestive) heart failure: Secondary | ICD-10-CM | POA: Diagnosis not present

## 2022-01-30 DIAGNOSIS — E782 Mixed hyperlipidemia: Secondary | ICD-10-CM | POA: Diagnosis not present

## 2022-01-30 DIAGNOSIS — I951 Orthostatic hypotension: Secondary | ICD-10-CM | POA: Diagnosis not present

## 2022-01-30 DIAGNOSIS — I890 Lymphedema, not elsewhere classified: Secondary | ICD-10-CM | POA: Diagnosis not present

## 2022-01-30 DIAGNOSIS — T826XXA Infection and inflammatory reaction due to cardiac valve prosthesis, initial encounter: Secondary | ICD-10-CM | POA: Diagnosis not present

## 2022-01-30 DIAGNOSIS — E1165 Type 2 diabetes mellitus with hyperglycemia: Secondary | ICD-10-CM | POA: Diagnosis not present

## 2022-01-30 NOTE — Progress Notes (Unsigned)
Name: Melody Soto   MRN: [id number]    DOB: [birth year]   Date:01/31/2022       Progress Note  Subjective  Chief Complaint  Hospital Follow-Up  HPI  Recent events:  10/24: Admitted with Enterococcus bacteremia  10/26: Transferred to [place] for CT surgical evaluation  11/3: To OR for removal of TAVR, replacement with bioprosthetic valve  11/7: Thoracentesis for bilateral pleural effusions - normal pH, culture negative  Patient was seen in our office in October, she was not feeling well and we sent her to [facility], she did not wait to be seen and went to [facility] instead, she was 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 She was given IV antibiotics and valva was replaced on 11/03. She was discharge home ( stayed with a friend for a few weeks ) before Thanksgiving. She states she has been compliant with medications ( medication reconciliation was done) , she has normal appetite, chest soreness if finally resolving. She has stable lower extremity lymphedema. Denies SOB.   Change in medications: no longer on Eliquis, just aspirin. Off spironolactone and also micardis HCTZ. Her bp was low initially but has been normal at home, advised her to monitor and we may need to resume Micardis  Malnutrition: low albumin while admitted, also lost 9 lbs the month leading to admission. Discussed importance of not focusing on dieting now but to eat healthy, avoid salt and try to gain ( to get to baseline) or maintain her weight.    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/23/2020   Obesity (BMI 30.0-34.9) 12/23/2020   Thoracic aorta  atherosclerosis ([place]) 12/23/2020   Subclavian vein occlusion, right ([place]) 10/04/2020   Paroxysmal atrial fibrillation ([place]) 10/02/2019   Diverticulosis of colon 08/08/2018   Cholelithiasis 08/08/2018   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   Chronic acquired lymphedema 01/08/2017   FIGO stage II endometrial cancer ([place]) 10/29/2016   Left ventricular hypertrophy 11/27/2014   Benign essential HTN 08/21/2014   Dyslipidemia 08/21/2014   Cardiac dysrhythmia 62/37/6283   Dysmetabolic syndrome 15/17/6160   Female genuine stress incontinence 08/21/2014   History of shingles 08/21/2014   Personal history of skin cancer 11/24/2009    Past Surgical History:  Procedure Laterality Date   AORTIC VALVE REPLACEMENT Bilateral 02/27/2017   CARDIAC CATHETERIZATION     CARDIAC VALVE REPLACEMENT  02/27/2017   CARDIOVERSION  10/2019   cataract surgery Bilateral 05/24/2014   second eye 06/07/2014   COLONOSCOPY WITH PROPOFOL N/A 09/24/2019   Procedure: COLONOSCOPY WITH PROPOFOL;  Surgeon: [name], MD;  Location: [facility] ENDOSCOPY;  Service: Gastroenterology;  Laterality: N/A;   COLONOSCOPY WITH PROPOFOL N/A 12/10/2019   Procedure: COLONOSCOPY WITH PROPOFOL;  Surgeon: [name], MD;  Location: [facility] ENDOSCOPY;  Service: Endoscopy;  Laterality: N/A;   CORONARY ARTERY BYPASS GRAFT     EYE SURGERY     MELANOMA EXCISION     OVARIAN CYST REMOVAL     EXPLORATORY LAPAROTOMY   TONSILLECTOMY      Family History  Problem Relation Age of Onset   Stroke Mother    Thyroid nodules Mother  Lung cancer Mother    Cancer Mother    Hypertension Mother    Diabetes Sister    Hypertension Sister    Aortic aneurysm Father    Early death Father    Heart disease Father    Obesity Father    Stroke Father    Breast cancer Maternal Aunt    Bone cancer Maternal Uncle    Depression Daughter    Anxiety disorder Daughter    Depression Daughter     Social History    Tobacco Use   Smoking status: Never   Smokeless tobacco: Never  Substance Use Topics   Alcohol use: Not Currently     Current Outpatient Medications:    amiodarone (PACERONE) 200 MG tablet, Take 200 mg by mouth daily., Disp: , Rfl:    Ascorbic Acid (VITAMIN C) 1000 MG tablet, Take 1,000 mg by mouth in the morning and at bedtime., Disp: , Rfl:    atorvastatin (LIPITOR) 40 MG tablet, Take 1 tablet (40 mg total) by mouth 2 (two) times a week., Disp: 24 tablet, Rfl: 1   diltiazem (CARDIZEM CD) 120 MG 24 hr capsule, Take 120 mg by mouth daily., Disp: , Rfl:    furosemide (LASIX) 40 MG tablet, Take 20 mg by mouth daily., Disp: , Rfl:    JARDIANCE 10 MG TABS tablet, Take 10 mg by mouth daily., Disp: , Rfl:    Magnesium 500 MG CAPS, Take 800 mg by mouth in the morning and at bedtime. , Disp: , Rfl:    metoprolol succinate (TOPROL-XL) 50 MG 24 hr tablet, Take 150 mg by mouth daily., Disp: , Rfl:    Omega-3 Fatty Acids (FISH OIL) 1000 MG CAPS, Take 1 capsule by mouth daily., Disp: , Rfl:    Potassium Chloride ER 20 MEQ TBCR, Take 2 tablets by mouth daily., Disp: , Rfl:    Vitamin D-Vitamin K (VITAMIN K2-VITAMIN D3 PO), Take by mouth in the morning and at bedtime., Disp: , Rfl:    vitamin E 200 UNIT capsule, Take 200 Units by mouth 2 (two) times daily., Disp: , Rfl:    zinc gluconate 50 MG tablet, Take 50 mg by mouth daily., Disp: , Rfl:   Allergies  Allergen Reactions   Protamine Other (See Comments)    Severe hypotension refractory to pressor and volume administration, only reversed by administration of epinephrine.   Ace Inhibitors Other (See Comments)    UNKNOWN REACTION   Duloxetine Other (See Comments)    Drowsiness    I personally reviewed active problem list, medication list, allergies, family history, social history, health maintenance with the patient/caregiver today.   ROS  Constitutional: Negative for fever or weight change.  Respiratory: Negative for cough and shortness  of breath.   Cardiovascular: Negative for chest pain or palpitations.  Gastrointestinal: Negative for abdominal pain, no bowel changes.  Musculoskeletal: Negative for gait problem or joint swelling.  Skin: Negative for rash.  Neurological: Negative for dizziness or headache.  No other specific complaints in a complete review of systems (except as listed in HPI above).   Objective  Vitals:   01/31/22 1345  BP: 134/76  Pulse: 72  Resp: 16  SpO2: 99%  Weight: 220 lb (99.8 kg)  Height: '5\' 8"'$  (1.727 m)    Body mass index is 33.45 kg/m.  Physical Exam  Constitutional: Patient appears well-developed and malnutrition. No distress.  HEENT: head atraumatic, normocephalic, pupils equal and reactive to light,, neck supple, throat within normal limits Cardiovascular:  Normal rate, regular rhythm , 1/6 SEM on left upper sternal border very soft, Chronic and stably lower extremity lymphedema  Pulmonary/Chest: Effort normal and breath sounds normal. No respiratory distress. Abdominal: Soft.  There is no tenderness. Psychiatric: Patient has a normal mood and affect. behavior is normal. Judgment and thought content normal.    PHQ2/9:    01/31/2022    1:44 PM 12/01/2021    1:57 PM 09/12/2021   10:53 AM 07/28/2021   10:31 AM 12/23/2020   10:58 AM  Depression screen PHQ 2/9  Decreased Interest 0 1 0 0 0  Down, Depressed, Hopeless 0 0 0 1 0  PHQ - 2 Score 0 1 0 1 0  Altered sleeping 0 0 0 0 0  Tired, decreased energy 0 3 0 1 0  Change in appetite 0 0 0 0 0  Feeling bad or failure about yourself  0 0 0 0 0  Trouble concentrating 0 0 0 0 0  Moving slowly or fidgety/restless 0 0 0 0 0  Suicidal thoughts 0 0 0 0 0  PHQ-9 Score 0 4 0 2 0    phq 9 is negative   Fall Risk:    01/31/2022    1:44 PM 12/01/2021    1:57 PM 07/28/2021   10:31 AM 12/23/2020   10:58 AM 08/25/2020   10:25 AM  Fall Risk   Falls in the past year? 0 0 0 0 0  Number falls in past yr: 0 0 0 0 0  Injury with Fall?  0 0 0 0 0  Risk for fall due to : No Fall Risks No Fall Risks No Fall Risks No Fall Risks No Fall Risks  Follow up Falls prevention discussed Falls prevention discussed Falls prevention discussed Falls prevention discussed Falls prevention discussed      Functional Status Survey: Is the patient deaf or have difficulty hearing?: No Does the patient have difficulty seeing, even when wearing glasses/contacts?: No Does the patient have difficulty concentrating, remembering, or making decisions?: No Does the patient have difficulty walking or climbing stairs?: No Does the patient have difficulty dressing or bathing?: No Does the patient have difficulty doing errands alone such as visiting a doctor's office or shopping?: No    Assessment & Plan   1. Mild protein-calorie malnutrition ([place])  Discussed importance of not losing more weight   2. Paroxysmal atrial fibrillation ([facility])  Not on Eliquis, per cardiovascular surgeon, stopped due to risk of bleeding  3. Thoracic aorta atherosclerosis ([place])  On statin therapy   4. S/P TAVR (transcatheter aortic valve replacement)  Doing well   5. Hospital discharge follow-up  Reviewed notes with patient   6. History of endocarditis  Had to replace aortic valve, needs to take prophylactic antibiotics before procedures

## 2022-01-31 ENCOUNTER — Encounter: Payer: Self-pay | Admitting: Family Medicine

## 2022-01-31 ENCOUNTER — Ambulatory Visit (INDEPENDENT_AMBULATORY_CARE_PROVIDER_SITE_OTHER): Payer: Medicare HMO | Admitting: Family Medicine

## 2022-01-31 VITALS — BP 134/76 | HR 72 | Resp 16 | Ht 68.0 in | Wt 220.0 lb

## 2022-01-31 DIAGNOSIS — E441 Mild protein-calorie malnutrition: Secondary | ICD-10-CM

## 2022-01-31 DIAGNOSIS — T826XXA Infection and inflammatory reaction due to cardiac valve prosthesis, initial encounter: Secondary | ICD-10-CM | POA: Diagnosis not present

## 2022-01-31 DIAGNOSIS — I480 Paroxysmal atrial fibrillation: Secondary | ICD-10-CM

## 2022-01-31 DIAGNOSIS — Z952 Presence of prosthetic heart valve: Secondary | ICD-10-CM | POA: Diagnosis not present

## 2022-01-31 DIAGNOSIS — E782 Mixed hyperlipidemia: Secondary | ICD-10-CM | POA: Diagnosis not present

## 2022-01-31 DIAGNOSIS — I110 Hypertensive heart disease with heart failure: Secondary | ICD-10-CM | POA: Diagnosis not present

## 2022-01-31 DIAGNOSIS — I890 Lymphedema, not elsewhere classified: Secondary | ICD-10-CM | POA: Diagnosis not present

## 2022-01-31 DIAGNOSIS — I708 Atherosclerosis of other arteries: Secondary | ICD-10-CM | POA: Diagnosis not present

## 2022-01-31 DIAGNOSIS — Z09 Encounter for follow-up examination after completed treatment for conditions other than malignant neoplasm: Secondary | ICD-10-CM

## 2022-01-31 DIAGNOSIS — Z8679 Personal history of other diseases of the circulatory system: Secondary | ICD-10-CM | POA: Diagnosis not present

## 2022-01-31 DIAGNOSIS — E1165 Type 2 diabetes mellitus with hyperglycemia: Secondary | ICD-10-CM | POA: Diagnosis not present

## 2022-01-31 DIAGNOSIS — I700 Atherosclerosis of aorta: Secondary | ICD-10-CM

## 2022-01-31 DIAGNOSIS — I38 Endocarditis, valve unspecified: Secondary | ICD-10-CM | POA: Diagnosis not present

## 2022-01-31 DIAGNOSIS — B952 Enterococcus as the cause of diseases classified elsewhere: Secondary | ICD-10-CM | POA: Diagnosis not present

## 2022-01-31 DIAGNOSIS — I5042 Chronic combined systolic (congestive) and diastolic (congestive) heart failure: Secondary | ICD-10-CM | POA: Diagnosis not present

## 2022-01-31 DIAGNOSIS — D649 Anemia, unspecified: Secondary | ICD-10-CM | POA: Diagnosis not present

## 2022-01-31 DIAGNOSIS — F339 Major depressive disorder, recurrent, unspecified: Secondary | ICD-10-CM | POA: Diagnosis not present

## 2022-01-31 DIAGNOSIS — I951 Orthostatic hypotension: Secondary | ICD-10-CM | POA: Diagnosis not present

## 2022-02-01 DIAGNOSIS — I5042 Chronic combined systolic (congestive) and diastolic (congestive) heart failure: Secondary | ICD-10-CM | POA: Diagnosis not present

## 2022-02-01 DIAGNOSIS — I480 Paroxysmal atrial fibrillation: Secondary | ICD-10-CM | POA: Diagnosis not present

## 2022-02-01 DIAGNOSIS — B952 Enterococcus as the cause of diseases classified elsewhere: Secondary | ICD-10-CM | POA: Diagnosis not present

## 2022-02-01 DIAGNOSIS — I708 Atherosclerosis of other arteries: Secondary | ICD-10-CM | POA: Diagnosis not present

## 2022-02-01 DIAGNOSIS — I110 Hypertensive heart disease with heart failure: Secondary | ICD-10-CM | POA: Diagnosis not present

## 2022-02-01 DIAGNOSIS — I38 Endocarditis, valve unspecified: Secondary | ICD-10-CM | POA: Diagnosis not present

## 2022-02-01 DIAGNOSIS — T826XXA Infection and inflammatory reaction due to cardiac valve prosthesis, initial encounter: Secondary | ICD-10-CM | POA: Diagnosis not present

## 2022-02-01 DIAGNOSIS — E1165 Type 2 diabetes mellitus with hyperglycemia: Secondary | ICD-10-CM | POA: Diagnosis not present

## 2022-02-01 DIAGNOSIS — F339 Major depressive disorder, recurrent, unspecified: Secondary | ICD-10-CM | POA: Diagnosis not present

## 2022-02-01 DIAGNOSIS — I890 Lymphedema, not elsewhere classified: Secondary | ICD-10-CM | POA: Diagnosis not present

## 2022-02-01 DIAGNOSIS — D649 Anemia, unspecified: Secondary | ICD-10-CM | POA: Diagnosis not present

## 2022-02-01 DIAGNOSIS — E782 Mixed hyperlipidemia: Secondary | ICD-10-CM | POA: Diagnosis not present

## 2022-02-01 DIAGNOSIS — I951 Orthostatic hypotension: Secondary | ICD-10-CM | POA: Diagnosis not present

## 2022-02-02 DIAGNOSIS — T826XXA Infection and inflammatory reaction due to cardiac valve prosthesis, initial encounter: Secondary | ICD-10-CM | POA: Diagnosis not present

## 2022-02-02 DIAGNOSIS — E782 Mixed hyperlipidemia: Secondary | ICD-10-CM | POA: Diagnosis not present

## 2022-02-02 DIAGNOSIS — I480 Paroxysmal atrial fibrillation: Secondary | ICD-10-CM | POA: Diagnosis not present

## 2022-02-02 DIAGNOSIS — D649 Anemia, unspecified: Secondary | ICD-10-CM | POA: Diagnosis not present

## 2022-02-02 DIAGNOSIS — F339 Major depressive disorder, recurrent, unspecified: Secondary | ICD-10-CM | POA: Diagnosis not present

## 2022-02-02 DIAGNOSIS — I5042 Chronic combined systolic (congestive) and diastolic (congestive) heart failure: Secondary | ICD-10-CM | POA: Diagnosis not present

## 2022-02-02 DIAGNOSIS — B952 Enterococcus as the cause of diseases classified elsewhere: Secondary | ICD-10-CM | POA: Diagnosis not present

## 2022-02-02 DIAGNOSIS — I110 Hypertensive heart disease with heart failure: Secondary | ICD-10-CM | POA: Diagnosis not present

## 2022-02-02 DIAGNOSIS — E1165 Type 2 diabetes mellitus with hyperglycemia: Secondary | ICD-10-CM | POA: Diagnosis not present

## 2022-02-02 DIAGNOSIS — I951 Orthostatic hypotension: Secondary | ICD-10-CM | POA: Diagnosis not present

## 2022-02-02 DIAGNOSIS — I38 Endocarditis, valve unspecified: Secondary | ICD-10-CM | POA: Diagnosis not present

## 2022-02-02 DIAGNOSIS — I890 Lymphedema, not elsewhere classified: Secondary | ICD-10-CM | POA: Diagnosis not present

## 2022-02-02 DIAGNOSIS — I708 Atherosclerosis of other arteries: Secondary | ICD-10-CM | POA: Diagnosis not present

## 2022-02-03 DIAGNOSIS — T826XXA Infection and inflammatory reaction due to cardiac valve prosthesis, initial encounter: Secondary | ICD-10-CM | POA: Diagnosis not present

## 2022-02-03 DIAGNOSIS — I890 Lymphedema, not elsewhere classified: Secondary | ICD-10-CM | POA: Diagnosis not present

## 2022-02-03 DIAGNOSIS — I5042 Chronic combined systolic (congestive) and diastolic (congestive) heart failure: Secondary | ICD-10-CM | POA: Diagnosis not present

## 2022-02-03 DIAGNOSIS — I110 Hypertensive heart disease with heart failure: Secondary | ICD-10-CM | POA: Diagnosis not present

## 2022-02-03 DIAGNOSIS — I708 Atherosclerosis of other arteries: Secondary | ICD-10-CM | POA: Diagnosis not present

## 2022-02-03 DIAGNOSIS — I951 Orthostatic hypotension: Secondary | ICD-10-CM | POA: Diagnosis not present

## 2022-02-03 DIAGNOSIS — D649 Anemia, unspecified: Secondary | ICD-10-CM | POA: Diagnosis not present

## 2022-02-03 DIAGNOSIS — B952 Enterococcus as the cause of diseases classified elsewhere: Secondary | ICD-10-CM | POA: Diagnosis not present

## 2022-02-03 DIAGNOSIS — I38 Endocarditis, valve unspecified: Secondary | ICD-10-CM | POA: Diagnosis not present

## 2022-02-03 DIAGNOSIS — E1165 Type 2 diabetes mellitus with hyperglycemia: Secondary | ICD-10-CM | POA: Diagnosis not present

## 2022-02-03 DIAGNOSIS — E782 Mixed hyperlipidemia: Secondary | ICD-10-CM | POA: Diagnosis not present

## 2022-02-03 DIAGNOSIS — I480 Paroxysmal atrial fibrillation: Secondary | ICD-10-CM | POA: Diagnosis not present

## 2022-02-03 DIAGNOSIS — F339 Major depressive disorder, recurrent, unspecified: Secondary | ICD-10-CM | POA: Diagnosis not present

## 2022-02-04 DIAGNOSIS — E782 Mixed hyperlipidemia: Secondary | ICD-10-CM | POA: Diagnosis not present

## 2022-02-04 DIAGNOSIS — E1165 Type 2 diabetes mellitus with hyperglycemia: Secondary | ICD-10-CM | POA: Diagnosis not present

## 2022-02-04 DIAGNOSIS — I708 Atherosclerosis of other arteries: Secondary | ICD-10-CM | POA: Diagnosis not present

## 2022-02-04 DIAGNOSIS — I951 Orthostatic hypotension: Secondary | ICD-10-CM | POA: Diagnosis not present

## 2022-02-04 DIAGNOSIS — T826XXA Infection and inflammatory reaction due to cardiac valve prosthesis, initial encounter: Secondary | ICD-10-CM | POA: Diagnosis not present

## 2022-02-04 DIAGNOSIS — B952 Enterococcus as the cause of diseases classified elsewhere: Secondary | ICD-10-CM | POA: Diagnosis not present

## 2022-02-04 DIAGNOSIS — I890 Lymphedema, not elsewhere classified: Secondary | ICD-10-CM | POA: Diagnosis not present

## 2022-02-04 DIAGNOSIS — I5042 Chronic combined systolic (congestive) and diastolic (congestive) heart failure: Secondary | ICD-10-CM | POA: Diagnosis not present

## 2022-02-04 DIAGNOSIS — I38 Endocarditis, valve unspecified: Secondary | ICD-10-CM | POA: Diagnosis not present

## 2022-02-04 DIAGNOSIS — I110 Hypertensive heart disease with heart failure: Secondary | ICD-10-CM | POA: Diagnosis not present

## 2022-02-04 DIAGNOSIS — F339 Major depressive disorder, recurrent, unspecified: Secondary | ICD-10-CM | POA: Diagnosis not present

## 2022-02-04 DIAGNOSIS — I480 Paroxysmal atrial fibrillation: Secondary | ICD-10-CM | POA: Diagnosis not present

## 2022-02-04 DIAGNOSIS — D649 Anemia, unspecified: Secondary | ICD-10-CM | POA: Diagnosis not present

## 2022-02-05 DIAGNOSIS — B952 Enterococcus as the cause of diseases classified elsewhere: Secondary | ICD-10-CM | POA: Diagnosis not present

## 2022-02-05 DIAGNOSIS — I951 Orthostatic hypotension: Secondary | ICD-10-CM | POA: Diagnosis not present

## 2022-02-05 DIAGNOSIS — E782 Mixed hyperlipidemia: Secondary | ICD-10-CM | POA: Diagnosis not present

## 2022-02-05 DIAGNOSIS — D649 Anemia, unspecified: Secondary | ICD-10-CM | POA: Diagnosis not present

## 2022-02-05 DIAGNOSIS — F339 Major depressive disorder, recurrent, unspecified: Secondary | ICD-10-CM | POA: Diagnosis not present

## 2022-02-05 DIAGNOSIS — I890 Lymphedema, not elsewhere classified: Secondary | ICD-10-CM | POA: Diagnosis not present

## 2022-02-05 DIAGNOSIS — E1165 Type 2 diabetes mellitus with hyperglycemia: Secondary | ICD-10-CM | POA: Diagnosis not present

## 2022-02-05 DIAGNOSIS — T826XXA Infection and inflammatory reaction due to cardiac valve prosthesis, initial encounter: Secondary | ICD-10-CM | POA: Diagnosis not present

## 2022-02-05 DIAGNOSIS — I110 Hypertensive heart disease with heart failure: Secondary | ICD-10-CM | POA: Diagnosis not present

## 2022-02-05 DIAGNOSIS — I5042 Chronic combined systolic (congestive) and diastolic (congestive) heart failure: Secondary | ICD-10-CM | POA: Diagnosis not present

## 2022-02-05 DIAGNOSIS — I38 Endocarditis, valve unspecified: Secondary | ICD-10-CM | POA: Diagnosis not present

## 2022-02-05 DIAGNOSIS — I480 Paroxysmal atrial fibrillation: Secondary | ICD-10-CM | POA: Diagnosis not present

## 2022-02-05 DIAGNOSIS — I708 Atherosclerosis of other arteries: Secondary | ICD-10-CM | POA: Diagnosis not present

## 2022-02-06 DIAGNOSIS — T826XXA Infection and inflammatory reaction due to cardiac valve prosthesis, initial encounter: Secondary | ICD-10-CM | POA: Diagnosis not present

## 2022-02-06 DIAGNOSIS — E1165 Type 2 diabetes mellitus with hyperglycemia: Secondary | ICD-10-CM | POA: Diagnosis not present

## 2022-02-06 DIAGNOSIS — B952 Enterococcus as the cause of diseases classified elsewhere: Secondary | ICD-10-CM | POA: Diagnosis not present

## 2022-02-06 DIAGNOSIS — I708 Atherosclerosis of other arteries: Secondary | ICD-10-CM | POA: Diagnosis not present

## 2022-02-06 DIAGNOSIS — D649 Anemia, unspecified: Secondary | ICD-10-CM | POA: Diagnosis not present

## 2022-02-06 DIAGNOSIS — I110 Hypertensive heart disease with heart failure: Secondary | ICD-10-CM | POA: Diagnosis not present

## 2022-02-06 DIAGNOSIS — I890 Lymphedema, not elsewhere classified: Secondary | ICD-10-CM | POA: Diagnosis not present

## 2022-02-06 DIAGNOSIS — I38 Endocarditis, valve unspecified: Secondary | ICD-10-CM | POA: Diagnosis not present

## 2022-02-06 DIAGNOSIS — I5042 Chronic combined systolic (congestive) and diastolic (congestive) heart failure: Secondary | ICD-10-CM | POA: Diagnosis not present

## 2022-02-06 DIAGNOSIS — F339 Major depressive disorder, recurrent, unspecified: Secondary | ICD-10-CM | POA: Diagnosis not present

## 2022-02-06 DIAGNOSIS — E782 Mixed hyperlipidemia: Secondary | ICD-10-CM | POA: Diagnosis not present

## 2022-02-06 DIAGNOSIS — I480 Paroxysmal atrial fibrillation: Secondary | ICD-10-CM | POA: Diagnosis not present

## 2022-02-06 DIAGNOSIS — I951 Orthostatic hypotension: Secondary | ICD-10-CM | POA: Diagnosis not present

## 2022-02-07 DIAGNOSIS — T826XXA Infection and inflammatory reaction due to cardiac valve prosthesis, initial encounter: Secondary | ICD-10-CM | POA: Diagnosis not present

## 2022-02-07 DIAGNOSIS — I38 Endocarditis, valve unspecified: Secondary | ICD-10-CM | POA: Diagnosis not present

## 2022-02-07 DIAGNOSIS — D649 Anemia, unspecified: Secondary | ICD-10-CM | POA: Diagnosis not present

## 2022-02-07 DIAGNOSIS — E1165 Type 2 diabetes mellitus with hyperglycemia: Secondary | ICD-10-CM | POA: Diagnosis not present

## 2022-02-07 DIAGNOSIS — I708 Atherosclerosis of other arteries: Secondary | ICD-10-CM | POA: Diagnosis not present

## 2022-02-07 DIAGNOSIS — F339 Major depressive disorder, recurrent, unspecified: Secondary | ICD-10-CM | POA: Diagnosis not present

## 2022-02-07 DIAGNOSIS — I890 Lymphedema, not elsewhere classified: Secondary | ICD-10-CM | POA: Diagnosis not present

## 2022-02-07 DIAGNOSIS — I480 Paroxysmal atrial fibrillation: Secondary | ICD-10-CM | POA: Diagnosis not present

## 2022-02-07 DIAGNOSIS — I951 Orthostatic hypotension: Secondary | ICD-10-CM | POA: Diagnosis not present

## 2022-02-07 DIAGNOSIS — B952 Enterococcus as the cause of diseases classified elsewhere: Secondary | ICD-10-CM | POA: Diagnosis not present

## 2022-02-07 DIAGNOSIS — I5042 Chronic combined systolic (congestive) and diastolic (congestive) heart failure: Secondary | ICD-10-CM | POA: Diagnosis not present

## 2022-02-07 DIAGNOSIS — E782 Mixed hyperlipidemia: Secondary | ICD-10-CM | POA: Diagnosis not present

## 2022-02-07 DIAGNOSIS — I110 Hypertensive heart disease with heart failure: Secondary | ICD-10-CM | POA: Diagnosis not present

## 2022-02-08 DIAGNOSIS — D649 Anemia, unspecified: Secondary | ICD-10-CM | POA: Diagnosis not present

## 2022-02-08 DIAGNOSIS — T826XXA Infection and inflammatory reaction due to cardiac valve prosthesis, initial encounter: Secondary | ICD-10-CM | POA: Diagnosis not present

## 2022-02-08 DIAGNOSIS — F339 Major depressive disorder, recurrent, unspecified: Secondary | ICD-10-CM | POA: Diagnosis not present

## 2022-02-08 DIAGNOSIS — I110 Hypertensive heart disease with heart failure: Secondary | ICD-10-CM | POA: Diagnosis not present

## 2022-02-08 DIAGNOSIS — E782 Mixed hyperlipidemia: Secondary | ICD-10-CM | POA: Diagnosis not present

## 2022-02-08 DIAGNOSIS — I951 Orthostatic hypotension: Secondary | ICD-10-CM | POA: Diagnosis not present

## 2022-02-08 DIAGNOSIS — I890 Lymphedema, not elsewhere classified: Secondary | ICD-10-CM | POA: Diagnosis not present

## 2022-02-08 DIAGNOSIS — I5042 Chronic combined systolic (congestive) and diastolic (congestive) heart failure: Secondary | ICD-10-CM | POA: Diagnosis not present

## 2022-02-08 DIAGNOSIS — I38 Endocarditis, valve unspecified: Secondary | ICD-10-CM | POA: Diagnosis not present

## 2022-02-08 DIAGNOSIS — B952 Enterococcus as the cause of diseases classified elsewhere: Secondary | ICD-10-CM | POA: Diagnosis not present

## 2022-02-08 DIAGNOSIS — I708 Atherosclerosis of other arteries: Secondary | ICD-10-CM | POA: Diagnosis not present

## 2022-02-08 DIAGNOSIS — I480 Paroxysmal atrial fibrillation: Secondary | ICD-10-CM | POA: Diagnosis not present

## 2022-02-08 DIAGNOSIS — E1165 Type 2 diabetes mellitus with hyperglycemia: Secondary | ICD-10-CM | POA: Diagnosis not present

## 2022-02-09 DIAGNOSIS — T826XXA Infection and inflammatory reaction due to cardiac valve prosthesis, initial encounter: Secondary | ICD-10-CM | POA: Diagnosis not present

## 2022-02-09 DIAGNOSIS — D649 Anemia, unspecified: Secondary | ICD-10-CM | POA: Diagnosis not present

## 2022-02-09 DIAGNOSIS — E1165 Type 2 diabetes mellitus with hyperglycemia: Secondary | ICD-10-CM | POA: Diagnosis not present

## 2022-02-09 DIAGNOSIS — I480 Paroxysmal atrial fibrillation: Secondary | ICD-10-CM | POA: Diagnosis not present

## 2022-02-09 DIAGNOSIS — I5042 Chronic combined systolic (congestive) and diastolic (congestive) heart failure: Secondary | ICD-10-CM | POA: Diagnosis not present

## 2022-02-09 DIAGNOSIS — I890 Lymphedema, not elsewhere classified: Secondary | ICD-10-CM | POA: Diagnosis not present

## 2022-02-09 DIAGNOSIS — F339 Major depressive disorder, recurrent, unspecified: Secondary | ICD-10-CM | POA: Diagnosis not present

## 2022-02-09 DIAGNOSIS — I38 Endocarditis, valve unspecified: Secondary | ICD-10-CM | POA: Diagnosis not present

## 2022-02-09 DIAGNOSIS — B952 Enterococcus as the cause of diseases classified elsewhere: Secondary | ICD-10-CM | POA: Diagnosis not present

## 2022-02-09 DIAGNOSIS — I951 Orthostatic hypotension: Secondary | ICD-10-CM | POA: Diagnosis not present

## 2022-02-09 DIAGNOSIS — I110 Hypertensive heart disease with heart failure: Secondary | ICD-10-CM | POA: Diagnosis not present

## 2022-02-09 DIAGNOSIS — I708 Atherosclerosis of other arteries: Secondary | ICD-10-CM | POA: Diagnosis not present

## 2022-02-09 DIAGNOSIS — E782 Mixed hyperlipidemia: Secondary | ICD-10-CM | POA: Diagnosis not present

## 2022-02-10 DIAGNOSIS — I890 Lymphedema, not elsewhere classified: Secondary | ICD-10-CM | POA: Diagnosis not present

## 2022-02-10 DIAGNOSIS — T826XXA Infection and inflammatory reaction due to cardiac valve prosthesis, initial encounter: Secondary | ICD-10-CM | POA: Diagnosis not present

## 2022-02-10 DIAGNOSIS — I38 Endocarditis, valve unspecified: Secondary | ICD-10-CM | POA: Diagnosis not present

## 2022-02-10 DIAGNOSIS — B952 Enterococcus as the cause of diseases classified elsewhere: Secondary | ICD-10-CM | POA: Diagnosis not present

## 2022-02-10 DIAGNOSIS — I708 Atherosclerosis of other arteries: Secondary | ICD-10-CM | POA: Diagnosis not present

## 2022-02-10 DIAGNOSIS — I480 Paroxysmal atrial fibrillation: Secondary | ICD-10-CM | POA: Diagnosis not present

## 2022-02-10 DIAGNOSIS — I951 Orthostatic hypotension: Secondary | ICD-10-CM | POA: Diagnosis not present

## 2022-02-10 DIAGNOSIS — I110 Hypertensive heart disease with heart failure: Secondary | ICD-10-CM | POA: Diagnosis not present

## 2022-02-10 DIAGNOSIS — F339 Major depressive disorder, recurrent, unspecified: Secondary | ICD-10-CM | POA: Diagnosis not present

## 2022-02-10 DIAGNOSIS — E1165 Type 2 diabetes mellitus with hyperglycemia: Secondary | ICD-10-CM | POA: Diagnosis not present

## 2022-02-10 DIAGNOSIS — I5042 Chronic combined systolic (congestive) and diastolic (congestive) heart failure: Secondary | ICD-10-CM | POA: Diagnosis not present

## 2022-02-10 DIAGNOSIS — E782 Mixed hyperlipidemia: Secondary | ICD-10-CM | POA: Diagnosis not present

## 2022-02-10 DIAGNOSIS — D649 Anemia, unspecified: Secondary | ICD-10-CM | POA: Diagnosis not present

## 2022-02-11 DIAGNOSIS — D649 Anemia, unspecified: Secondary | ICD-10-CM | POA: Diagnosis not present

## 2022-02-11 DIAGNOSIS — E782 Mixed hyperlipidemia: Secondary | ICD-10-CM | POA: Diagnosis not present

## 2022-02-11 DIAGNOSIS — I110 Hypertensive heart disease with heart failure: Secondary | ICD-10-CM | POA: Diagnosis not present

## 2022-02-11 DIAGNOSIS — I708 Atherosclerosis of other arteries: Secondary | ICD-10-CM | POA: Diagnosis not present

## 2022-02-11 DIAGNOSIS — I5042 Chronic combined systolic (congestive) and diastolic (congestive) heart failure: Secondary | ICD-10-CM | POA: Diagnosis not present

## 2022-02-11 DIAGNOSIS — I951 Orthostatic hypotension: Secondary | ICD-10-CM | POA: Diagnosis not present

## 2022-02-11 DIAGNOSIS — T826XXA Infection and inflammatory reaction due to cardiac valve prosthesis, initial encounter: Secondary | ICD-10-CM | POA: Diagnosis not present

## 2022-02-11 DIAGNOSIS — E1165 Type 2 diabetes mellitus with hyperglycemia: Secondary | ICD-10-CM | POA: Diagnosis not present

## 2022-02-11 DIAGNOSIS — F339 Major depressive disorder, recurrent, unspecified: Secondary | ICD-10-CM | POA: Diagnosis not present

## 2022-02-11 DIAGNOSIS — B952 Enterococcus as the cause of diseases classified elsewhere: Secondary | ICD-10-CM | POA: Diagnosis not present

## 2022-02-11 DIAGNOSIS — I480 Paroxysmal atrial fibrillation: Secondary | ICD-10-CM | POA: Diagnosis not present

## 2022-02-11 DIAGNOSIS — I890 Lymphedema, not elsewhere classified: Secondary | ICD-10-CM | POA: Diagnosis not present

## 2022-02-11 DIAGNOSIS — I38 Endocarditis, valve unspecified: Secondary | ICD-10-CM | POA: Diagnosis not present

## 2022-02-12 DIAGNOSIS — I5042 Chronic combined systolic (congestive) and diastolic (congestive) heart failure: Secondary | ICD-10-CM | POA: Diagnosis not present

## 2022-02-12 DIAGNOSIS — I890 Lymphedema, not elsewhere classified: Secondary | ICD-10-CM | POA: Diagnosis not present

## 2022-02-12 DIAGNOSIS — D649 Anemia, unspecified: Secondary | ICD-10-CM | POA: Diagnosis not present

## 2022-02-12 DIAGNOSIS — T826XXA Infection and inflammatory reaction due to cardiac valve prosthesis, initial encounter: Secondary | ICD-10-CM | POA: Diagnosis not present

## 2022-02-12 DIAGNOSIS — B952 Enterococcus as the cause of diseases classified elsewhere: Secondary | ICD-10-CM | POA: Diagnosis not present

## 2022-02-12 DIAGNOSIS — E1165 Type 2 diabetes mellitus with hyperglycemia: Secondary | ICD-10-CM | POA: Diagnosis not present

## 2022-02-12 DIAGNOSIS — F339 Major depressive disorder, recurrent, unspecified: Secondary | ICD-10-CM | POA: Diagnosis not present

## 2022-02-12 DIAGNOSIS — E782 Mixed hyperlipidemia: Secondary | ICD-10-CM | POA: Diagnosis not present

## 2022-02-12 DIAGNOSIS — I110 Hypertensive heart disease with heart failure: Secondary | ICD-10-CM | POA: Diagnosis not present

## 2022-02-12 DIAGNOSIS — I708 Atherosclerosis of other arteries: Secondary | ICD-10-CM | POA: Diagnosis not present

## 2022-02-12 DIAGNOSIS — I480 Paroxysmal atrial fibrillation: Secondary | ICD-10-CM | POA: Diagnosis not present

## 2022-02-12 DIAGNOSIS — I38 Endocarditis, valve unspecified: Secondary | ICD-10-CM | POA: Diagnosis not present

## 2022-02-12 DIAGNOSIS — I951 Orthostatic hypotension: Secondary | ICD-10-CM | POA: Diagnosis not present

## 2022-02-13 DIAGNOSIS — T826XXA Infection and inflammatory reaction due to cardiac valve prosthesis, initial encounter: Secondary | ICD-10-CM | POA: Diagnosis not present

## 2022-02-13 DIAGNOSIS — I5042 Chronic combined systolic (congestive) and diastolic (congestive) heart failure: Secondary | ICD-10-CM | POA: Diagnosis not present

## 2022-02-13 DIAGNOSIS — D649 Anemia, unspecified: Secondary | ICD-10-CM | POA: Diagnosis not present

## 2022-02-13 DIAGNOSIS — I890 Lymphedema, not elsewhere classified: Secondary | ICD-10-CM | POA: Diagnosis not present

## 2022-02-13 DIAGNOSIS — I708 Atherosclerosis of other arteries: Secondary | ICD-10-CM | POA: Diagnosis not present

## 2022-02-13 DIAGNOSIS — I38 Endocarditis, valve unspecified: Secondary | ICD-10-CM | POA: Diagnosis not present

## 2022-02-13 DIAGNOSIS — F339 Major depressive disorder, recurrent, unspecified: Secondary | ICD-10-CM | POA: Diagnosis not present

## 2022-02-13 DIAGNOSIS — B952 Enterococcus as the cause of diseases classified elsewhere: Secondary | ICD-10-CM | POA: Diagnosis not present

## 2022-02-13 DIAGNOSIS — E1165 Type 2 diabetes mellitus with hyperglycemia: Secondary | ICD-10-CM | POA: Diagnosis not present

## 2022-02-13 DIAGNOSIS — I480 Paroxysmal atrial fibrillation: Secondary | ICD-10-CM | POA: Diagnosis not present

## 2022-02-13 DIAGNOSIS — E782 Mixed hyperlipidemia: Secondary | ICD-10-CM | POA: Diagnosis not present

## 2022-02-13 DIAGNOSIS — I110 Hypertensive heart disease with heart failure: Secondary | ICD-10-CM | POA: Diagnosis not present

## 2022-02-13 DIAGNOSIS — I951 Orthostatic hypotension: Secondary | ICD-10-CM | POA: Diagnosis not present

## 2022-02-14 DIAGNOSIS — I38 Endocarditis, valve unspecified: Secondary | ICD-10-CM | POA: Diagnosis not present

## 2022-02-14 DIAGNOSIS — E782 Mixed hyperlipidemia: Secondary | ICD-10-CM | POA: Diagnosis not present

## 2022-02-14 DIAGNOSIS — B952 Enterococcus as the cause of diseases classified elsewhere: Secondary | ICD-10-CM | POA: Diagnosis not present

## 2022-02-14 DIAGNOSIS — F339 Major depressive disorder, recurrent, unspecified: Secondary | ICD-10-CM | POA: Diagnosis not present

## 2022-02-14 DIAGNOSIS — T826XXA Infection and inflammatory reaction due to cardiac valve prosthesis, initial encounter: Secondary | ICD-10-CM | POA: Diagnosis not present

## 2022-02-14 DIAGNOSIS — I5042 Chronic combined systolic (congestive) and diastolic (congestive) heart failure: Secondary | ICD-10-CM | POA: Diagnosis not present

## 2022-02-14 DIAGNOSIS — I110 Hypertensive heart disease with heart failure: Secondary | ICD-10-CM | POA: Diagnosis not present

## 2022-02-14 DIAGNOSIS — D649 Anemia, unspecified: Secondary | ICD-10-CM | POA: Diagnosis not present

## 2022-02-14 DIAGNOSIS — I708 Atherosclerosis of other arteries: Secondary | ICD-10-CM | POA: Diagnosis not present

## 2022-02-14 DIAGNOSIS — I951 Orthostatic hypotension: Secondary | ICD-10-CM | POA: Diagnosis not present

## 2022-02-14 DIAGNOSIS — I890 Lymphedema, not elsewhere classified: Secondary | ICD-10-CM | POA: Diagnosis not present

## 2022-02-14 DIAGNOSIS — I480 Paroxysmal atrial fibrillation: Secondary | ICD-10-CM | POA: Diagnosis not present

## 2022-02-14 DIAGNOSIS — E1165 Type 2 diabetes mellitus with hyperglycemia: Secondary | ICD-10-CM | POA: Diagnosis not present

## 2022-02-15 DIAGNOSIS — I110 Hypertensive heart disease with heart failure: Secondary | ICD-10-CM | POA: Diagnosis not present

## 2022-02-15 DIAGNOSIS — F339 Major depressive disorder, recurrent, unspecified: Secondary | ICD-10-CM | POA: Diagnosis not present

## 2022-02-15 DIAGNOSIS — E1165 Type 2 diabetes mellitus with hyperglycemia: Secondary | ICD-10-CM | POA: Diagnosis not present

## 2022-02-15 DIAGNOSIS — I38 Endocarditis, valve unspecified: Secondary | ICD-10-CM | POA: Diagnosis not present

## 2022-02-15 DIAGNOSIS — I951 Orthostatic hypotension: Secondary | ICD-10-CM | POA: Diagnosis not present

## 2022-02-15 DIAGNOSIS — T826XXA Infection and inflammatory reaction due to cardiac valve prosthesis, initial encounter: Secondary | ICD-10-CM | POA: Diagnosis not present

## 2022-02-15 DIAGNOSIS — I890 Lymphedema, not elsewhere classified: Secondary | ICD-10-CM | POA: Diagnosis not present

## 2022-02-15 DIAGNOSIS — I480 Paroxysmal atrial fibrillation: Secondary | ICD-10-CM | POA: Diagnosis not present

## 2022-02-15 DIAGNOSIS — D649 Anemia, unspecified: Secondary | ICD-10-CM | POA: Diagnosis not present

## 2022-02-15 DIAGNOSIS — B952 Enterococcus as the cause of diseases classified elsewhere: Secondary | ICD-10-CM | POA: Diagnosis not present

## 2022-02-15 DIAGNOSIS — I708 Atherosclerosis of other arteries: Secondary | ICD-10-CM | POA: Diagnosis not present

## 2022-02-15 DIAGNOSIS — I5042 Chronic combined systolic (congestive) and diastolic (congestive) heart failure: Secondary | ICD-10-CM | POA: Diagnosis not present

## 2022-02-15 DIAGNOSIS — E782 Mixed hyperlipidemia: Secondary | ICD-10-CM | POA: Diagnosis not present

## 2022-02-16 DIAGNOSIS — I110 Hypertensive heart disease with heart failure: Secondary | ICD-10-CM | POA: Diagnosis not present

## 2022-02-16 DIAGNOSIS — T826XXA Infection and inflammatory reaction due to cardiac valve prosthesis, initial encounter: Secondary | ICD-10-CM | POA: Diagnosis not present

## 2022-02-16 DIAGNOSIS — E1165 Type 2 diabetes mellitus with hyperglycemia: Secondary | ICD-10-CM | POA: Diagnosis not present

## 2022-02-16 DIAGNOSIS — I890 Lymphedema, not elsewhere classified: Secondary | ICD-10-CM | POA: Diagnosis not present

## 2022-02-16 DIAGNOSIS — I951 Orthostatic hypotension: Secondary | ICD-10-CM | POA: Diagnosis not present

## 2022-02-16 DIAGNOSIS — I5042 Chronic combined systolic (congestive) and diastolic (congestive) heart failure: Secondary | ICD-10-CM | POA: Diagnosis not present

## 2022-02-16 DIAGNOSIS — D649 Anemia, unspecified: Secondary | ICD-10-CM | POA: Diagnosis not present

## 2022-02-16 DIAGNOSIS — F339 Major depressive disorder, recurrent, unspecified: Secondary | ICD-10-CM | POA: Diagnosis not present

## 2022-02-16 DIAGNOSIS — E782 Mixed hyperlipidemia: Secondary | ICD-10-CM | POA: Diagnosis not present

## 2022-02-16 DIAGNOSIS — I708 Atherosclerosis of other arteries: Secondary | ICD-10-CM | POA: Diagnosis not present

## 2022-02-16 DIAGNOSIS — I480 Paroxysmal atrial fibrillation: Secondary | ICD-10-CM | POA: Diagnosis not present

## 2022-02-16 DIAGNOSIS — I38 Endocarditis, valve unspecified: Secondary | ICD-10-CM | POA: Diagnosis not present

## 2022-02-16 DIAGNOSIS — B952 Enterococcus as the cause of diseases classified elsewhere: Secondary | ICD-10-CM | POA: Diagnosis not present

## 2022-02-17 DIAGNOSIS — B952 Enterococcus as the cause of diseases classified elsewhere: Secondary | ICD-10-CM | POA: Diagnosis not present

## 2022-02-17 DIAGNOSIS — I38 Endocarditis, valve unspecified: Secondary | ICD-10-CM | POA: Diagnosis not present

## 2022-02-17 DIAGNOSIS — I110 Hypertensive heart disease with heart failure: Secondary | ICD-10-CM | POA: Diagnosis not present

## 2022-02-17 DIAGNOSIS — E1165 Type 2 diabetes mellitus with hyperglycemia: Secondary | ICD-10-CM | POA: Diagnosis not present

## 2022-02-17 DIAGNOSIS — T826XXA Infection and inflammatory reaction due to cardiac valve prosthesis, initial encounter: Secondary | ICD-10-CM | POA: Diagnosis not present

## 2022-02-17 DIAGNOSIS — I708 Atherosclerosis of other arteries: Secondary | ICD-10-CM | POA: Diagnosis not present

## 2022-02-17 DIAGNOSIS — E782 Mixed hyperlipidemia: Secondary | ICD-10-CM | POA: Diagnosis not present

## 2022-02-17 DIAGNOSIS — I890 Lymphedema, not elsewhere classified: Secondary | ICD-10-CM | POA: Diagnosis not present

## 2022-02-17 DIAGNOSIS — I5042 Chronic combined systolic (congestive) and diastolic (congestive) heart failure: Secondary | ICD-10-CM | POA: Diagnosis not present

## 2022-02-17 DIAGNOSIS — I951 Orthostatic hypotension: Secondary | ICD-10-CM | POA: Diagnosis not present

## 2022-02-17 DIAGNOSIS — D649 Anemia, unspecified: Secondary | ICD-10-CM | POA: Diagnosis not present

## 2022-02-17 DIAGNOSIS — I480 Paroxysmal atrial fibrillation: Secondary | ICD-10-CM | POA: Diagnosis not present

## 2022-02-17 DIAGNOSIS — F339 Major depressive disorder, recurrent, unspecified: Secondary | ICD-10-CM | POA: Diagnosis not present

## 2022-02-18 DIAGNOSIS — T826XXA Infection and inflammatory reaction due to cardiac valve prosthesis, initial encounter: Secondary | ICD-10-CM | POA: Diagnosis not present

## 2022-02-18 DIAGNOSIS — E1165 Type 2 diabetes mellitus with hyperglycemia: Secondary | ICD-10-CM | POA: Diagnosis not present

## 2022-02-18 DIAGNOSIS — I480 Paroxysmal atrial fibrillation: Secondary | ICD-10-CM | POA: Diagnosis not present

## 2022-02-18 DIAGNOSIS — I110 Hypertensive heart disease with heart failure: Secondary | ICD-10-CM | POA: Diagnosis not present

## 2022-02-18 DIAGNOSIS — B952 Enterococcus as the cause of diseases classified elsewhere: Secondary | ICD-10-CM | POA: Diagnosis not present

## 2022-02-18 DIAGNOSIS — D649 Anemia, unspecified: Secondary | ICD-10-CM | POA: Diagnosis not present

## 2022-02-18 DIAGNOSIS — F339 Major depressive disorder, recurrent, unspecified: Secondary | ICD-10-CM | POA: Diagnosis not present

## 2022-02-18 DIAGNOSIS — I38 Endocarditis, valve unspecified: Secondary | ICD-10-CM | POA: Diagnosis not present

## 2022-02-18 DIAGNOSIS — I890 Lymphedema, not elsewhere classified: Secondary | ICD-10-CM | POA: Diagnosis not present

## 2022-02-18 DIAGNOSIS — I5042 Chronic combined systolic (congestive) and diastolic (congestive) heart failure: Secondary | ICD-10-CM | POA: Diagnosis not present

## 2022-02-18 DIAGNOSIS — I951 Orthostatic hypotension: Secondary | ICD-10-CM | POA: Diagnosis not present

## 2022-02-18 DIAGNOSIS — I708 Atherosclerosis of other arteries: Secondary | ICD-10-CM | POA: Diagnosis not present

## 2022-02-18 DIAGNOSIS — E782 Mixed hyperlipidemia: Secondary | ICD-10-CM | POA: Diagnosis not present

## 2022-02-19 DIAGNOSIS — T826XXA Infection and inflammatory reaction due to cardiac valve prosthesis, initial encounter: Secondary | ICD-10-CM | POA: Diagnosis not present

## 2022-02-19 DIAGNOSIS — Z1331 Encounter for screening for depression: Secondary | ICD-10-CM | POA: Diagnosis not present

## 2022-02-19 DIAGNOSIS — I110 Hypertensive heart disease with heart failure: Secondary | ICD-10-CM | POA: Diagnosis not present

## 2022-02-19 DIAGNOSIS — E782 Mixed hyperlipidemia: Secondary | ICD-10-CM | POA: Diagnosis not present

## 2022-02-19 DIAGNOSIS — I5042 Chronic combined systolic (congestive) and diastolic (congestive) heart failure: Secondary | ICD-10-CM | POA: Diagnosis not present

## 2022-02-19 DIAGNOSIS — T826XXD Infection and inflammatory reaction due to cardiac valve prosthesis, subsequent encounter: Secondary | ICD-10-CM | POA: Diagnosis not present

## 2022-02-19 DIAGNOSIS — D649 Anemia, unspecified: Secondary | ICD-10-CM | POA: Diagnosis not present

## 2022-02-19 DIAGNOSIS — I951 Orthostatic hypotension: Secondary | ICD-10-CM | POA: Diagnosis not present

## 2022-02-19 DIAGNOSIS — E1165 Type 2 diabetes mellitus with hyperglycemia: Secondary | ICD-10-CM | POA: Diagnosis not present

## 2022-02-19 DIAGNOSIS — B952 Enterococcus as the cause of diseases classified elsewhere: Secondary | ICD-10-CM | POA: Diagnosis not present

## 2022-02-19 DIAGNOSIS — X58XXXD Exposure to other specified factors, subsequent encounter: Secondary | ICD-10-CM | POA: Diagnosis not present

## 2022-02-19 DIAGNOSIS — G510 Bell's palsy: Secondary | ICD-10-CM | POA: Diagnosis not present

## 2022-02-19 DIAGNOSIS — I480 Paroxysmal atrial fibrillation: Secondary | ICD-10-CM | POA: Diagnosis not present

## 2022-02-19 DIAGNOSIS — I5022 Chronic systolic (congestive) heart failure: Secondary | ICD-10-CM | POA: Diagnosis not present

## 2022-02-19 DIAGNOSIS — I890 Lymphedema, not elsewhere classified: Secondary | ICD-10-CM | POA: Diagnosis not present

## 2022-02-19 DIAGNOSIS — F339 Major depressive disorder, recurrent, unspecified: Secondary | ICD-10-CM | POA: Diagnosis not present

## 2022-02-19 DIAGNOSIS — Z79899 Other long term (current) drug therapy: Secondary | ICD-10-CM | POA: Diagnosis not present

## 2022-02-19 DIAGNOSIS — I708 Atherosclerosis of other arteries: Secondary | ICD-10-CM | POA: Diagnosis not present

## 2022-02-19 DIAGNOSIS — I38 Endocarditis, valve unspecified: Secondary | ICD-10-CM | POA: Diagnosis not present

## 2022-02-20 DIAGNOSIS — E782 Mixed hyperlipidemia: Secondary | ICD-10-CM | POA: Diagnosis not present

## 2022-02-20 DIAGNOSIS — I480 Paroxysmal atrial fibrillation: Secondary | ICD-10-CM | POA: Diagnosis not present

## 2022-02-20 DIAGNOSIS — I951 Orthostatic hypotension: Secondary | ICD-10-CM | POA: Diagnosis not present

## 2022-02-20 DIAGNOSIS — I890 Lymphedema, not elsewhere classified: Secondary | ICD-10-CM | POA: Diagnosis not present

## 2022-02-20 DIAGNOSIS — E1165 Type 2 diabetes mellitus with hyperglycemia: Secondary | ICD-10-CM | POA: Diagnosis not present

## 2022-02-20 DIAGNOSIS — B952 Enterococcus as the cause of diseases classified elsewhere: Secondary | ICD-10-CM | POA: Diagnosis not present

## 2022-02-20 DIAGNOSIS — D649 Anemia, unspecified: Secondary | ICD-10-CM | POA: Diagnosis not present

## 2022-02-20 DIAGNOSIS — I708 Atherosclerosis of other arteries: Secondary | ICD-10-CM | POA: Diagnosis not present

## 2022-02-20 DIAGNOSIS — F339 Major depressive disorder, recurrent, unspecified: Secondary | ICD-10-CM | POA: Diagnosis not present

## 2022-02-20 DIAGNOSIS — I38 Endocarditis, valve unspecified: Secondary | ICD-10-CM | POA: Diagnosis not present

## 2022-02-20 DIAGNOSIS — T826XXA Infection and inflammatory reaction due to cardiac valve prosthesis, initial encounter: Secondary | ICD-10-CM | POA: Diagnosis not present

## 2022-02-20 DIAGNOSIS — I110 Hypertensive heart disease with heart failure: Secondary | ICD-10-CM | POA: Diagnosis not present

## 2022-02-20 DIAGNOSIS — I5042 Chronic combined systolic (congestive) and diastolic (congestive) heart failure: Secondary | ICD-10-CM | POA: Diagnosis not present

## 2022-02-21 DIAGNOSIS — E1165 Type 2 diabetes mellitus with hyperglycemia: Secondary | ICD-10-CM | POA: Diagnosis not present

## 2022-02-21 DIAGNOSIS — I38 Endocarditis, valve unspecified: Secondary | ICD-10-CM | POA: Diagnosis not present

## 2022-02-21 DIAGNOSIS — E782 Mixed hyperlipidemia: Secondary | ICD-10-CM | POA: Diagnosis not present

## 2022-02-21 DIAGNOSIS — I890 Lymphedema, not elsewhere classified: Secondary | ICD-10-CM | POA: Diagnosis not present

## 2022-02-21 DIAGNOSIS — B952 Enterococcus as the cause of diseases classified elsewhere: Secondary | ICD-10-CM | POA: Diagnosis not present

## 2022-02-21 DIAGNOSIS — I5042 Chronic combined systolic (congestive) and diastolic (congestive) heart failure: Secondary | ICD-10-CM | POA: Diagnosis not present

## 2022-02-21 DIAGNOSIS — I951 Orthostatic hypotension: Secondary | ICD-10-CM | POA: Diagnosis not present

## 2022-02-21 DIAGNOSIS — F339 Major depressive disorder, recurrent, unspecified: Secondary | ICD-10-CM | POA: Diagnosis not present

## 2022-02-21 DIAGNOSIS — I480 Paroxysmal atrial fibrillation: Secondary | ICD-10-CM | POA: Diagnosis not present

## 2022-02-21 DIAGNOSIS — I708 Atherosclerosis of other arteries: Secondary | ICD-10-CM | POA: Diagnosis not present

## 2022-02-21 DIAGNOSIS — D649 Anemia, unspecified: Secondary | ICD-10-CM | POA: Diagnosis not present

## 2022-02-21 DIAGNOSIS — T826XXA Infection and inflammatory reaction due to cardiac valve prosthesis, initial encounter: Secondary | ICD-10-CM | POA: Diagnosis not present

## 2022-02-21 DIAGNOSIS — I110 Hypertensive heart disease with heart failure: Secondary | ICD-10-CM | POA: Diagnosis not present

## 2022-02-22 DIAGNOSIS — I110 Hypertensive heart disease with heart failure: Secondary | ICD-10-CM | POA: Diagnosis not present

## 2022-02-22 DIAGNOSIS — I480 Paroxysmal atrial fibrillation: Secondary | ICD-10-CM | POA: Diagnosis not present

## 2022-02-22 DIAGNOSIS — B952 Enterococcus as the cause of diseases classified elsewhere: Secondary | ICD-10-CM | POA: Diagnosis not present

## 2022-02-22 DIAGNOSIS — T826XXA Infection and inflammatory reaction due to cardiac valve prosthesis, initial encounter: Secondary | ICD-10-CM | POA: Diagnosis not present

## 2022-02-22 DIAGNOSIS — I5042 Chronic combined systolic (congestive) and diastolic (congestive) heart failure: Secondary | ICD-10-CM | POA: Diagnosis not present

## 2022-02-22 DIAGNOSIS — E782 Mixed hyperlipidemia: Secondary | ICD-10-CM | POA: Diagnosis not present

## 2022-02-22 DIAGNOSIS — F339 Major depressive disorder, recurrent, unspecified: Secondary | ICD-10-CM | POA: Diagnosis not present

## 2022-02-22 DIAGNOSIS — I708 Atherosclerosis of other arteries: Secondary | ICD-10-CM | POA: Diagnosis not present

## 2022-02-22 DIAGNOSIS — I890 Lymphedema, not elsewhere classified: Secondary | ICD-10-CM | POA: Diagnosis not present

## 2022-02-22 DIAGNOSIS — I38 Endocarditis, valve unspecified: Secondary | ICD-10-CM | POA: Diagnosis not present

## 2022-02-22 DIAGNOSIS — D649 Anemia, unspecified: Secondary | ICD-10-CM | POA: Diagnosis not present

## 2022-02-22 DIAGNOSIS — E1165 Type 2 diabetes mellitus with hyperglycemia: Secondary | ICD-10-CM | POA: Diagnosis not present

## 2022-02-22 DIAGNOSIS — I951 Orthostatic hypotension: Secondary | ICD-10-CM | POA: Diagnosis not present

## 2022-02-22 NOTE — Progress Notes (Signed)
Name: Melody Soto   MRN: [id number]    DOB: 07-Oct-1950   Date:02/23/2022       Progress Note  Subjective  Chief Complaint  Fever/ Bodyaches  HPI  She states went to [facility] to see her ID on Monday and on Tuesday she developed body aches and fever TM 101, she has some rhinorrhea ( but not sure if due to illness ) also intermittent cough, she also developed diarrhea and had to take imodium, last bowel movement was yesterday. She is feeling better today. No fever or chills, appetite is back today.  Daughter also had a fever but only lasted 24 hours.   Afib and aorta valve replacement: she needs to see cardiologist, needs refill of cardizem and amiodarone and we will send 30 days until she can see cardiologist    Patient Active Problem List   Diagnosis Date Noted   Hypercalcemia 07/28/2021   Chronic combined systolic and diastolic congestive heart failure ([place]) 07/28/2021   History of colon polyps 07/28/2021   Elevated hematocrit 07/28/2021   Vitamin D deficiency 07/28/2021   Hyperglycemia 07/28/2021   Skin lesion 07/28/2021   Mild major depression ([place]) 12/23/2020   Obesity (BMI 30.0-34.9) 12/23/2020   Thoracic aorta atherosclerosis ([place]) 12/23/2020   Subclavian vein occlusion, right ([place]) 10/04/2020   Paroxysmal atrial fibrillation ([place]) 10/02/2019   Diverticulosis of colon 08/08/2018   Cholelithiasis 08/08/2018   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   Chronic acquired lymphedema 01/08/2017   FIGO stage II endometrial cancer ([place]) 10/29/2016   Left ventricular hypertrophy 11/27/2014   Benign essential HTN 08/21/2014   Dyslipidemia 08/21/2014   Cardiac dysrhythmia 47/42/5956   Dysmetabolic syndrome 38/75/6433   Female genuine stress incontinence 08/21/2014   History of shingles 08/21/2014   Personal history of skin cancer 11/24/2009    Past Surgical History:  Procedure Laterality Date   AORTIC VALVE REPLACEMENT Bilateral 02/27/2017   CARDIAC  CATHETERIZATION     CARDIAC VALVE REPLACEMENT  02/27/2017   CARDIOVERSION  10/2019   cataract surgery Bilateral 05/24/2014   second eye 06/07/2014   COLONOSCOPY WITH PROPOFOL N/A 09/24/2019   Procedure: COLONOSCOPY WITH PROPOFOL;  Surgeon: [name], MD;  Location: [facility] ENDOSCOPY;  Service: Gastroenterology;  Laterality: N/A;   COLONOSCOPY WITH PROPOFOL N/A 12/10/2019   Procedure: COLONOSCOPY WITH PROPOFOL;  Surgeon: [name], MD;  Location: [facility] ENDOSCOPY;  Service: Endoscopy;  Laterality: N/A;   CORONARY ARTERY BYPASS GRAFT     EYE SURGERY     MELANOMA EXCISION     OVARIAN CYST REMOVAL     EXPLORATORY LAPAROTOMY   TONSILLECTOMY      Family History  Problem Relation Age of Onset   Stroke Mother    Thyroid nodules Mother    Lung cancer Mother    Cancer Mother    Hypertension Mother    Diabetes Sister    Hypertension Sister    Aortic aneurysm Father    Early death Father    Heart disease Father    Obesity Father    Stroke Father    Breast cancer Maternal Aunt    Bone cancer Maternal Uncle    Depression Daughter    Anxiety disorder Daughter    Depression Daughter     Social History   Tobacco Use   Smoking status: Never   Smokeless tobacco: Never  Substance Use Topics   Alcohol use: Not Currently     Current Outpatient Medications:    amiodarone (PACERONE) 200  MG tablet, Take 200 mg by mouth daily., Disp: , Rfl:    Ascorbic Acid (VITAMIN C) 1000 MG tablet, Take 1,000 mg by mouth in the morning and at bedtime., Disp: , Rfl:    atorvastatin (LIPITOR) 40 MG tablet, Take 1 tablet (40 mg total) by mouth 2 (two) times a week., Disp: 24 tablet, Rfl: 1   diltiazem (CARDIZEM CD) 120 MG 24 hr capsule, Take 120 mg by mouth daily., Disp: , Rfl:    furosemide (LASIX) 40 MG tablet, Take 20 mg by mouth daily., Disp: , Rfl:    JARDIANCE 10 MG TABS tablet, Take 10 mg by mouth daily., Disp: , Rfl:    Magnesium 500 MG CAPS, Take 800 mg by mouth in the morning  and at bedtime. , Disp: , Rfl:    metoprolol succinate (TOPROL-XL) 50 MG 24 hr tablet, Take 150 mg by mouth daily., Disp: , Rfl:    Omega-3 Fatty Acids (FISH OIL) 1000 MG CAPS, Take 1 capsule by mouth daily., Disp: , Rfl:    Potassium Chloride ER 20 MEQ TBCR, Take 2 tablets by mouth daily., Disp: , Rfl:    Vitamin D-Vitamin K (VITAMIN K2-VITAMIN D3 PO), Take by mouth in the morning and at bedtime., Disp: , Rfl:    vitamin E 200 UNIT capsule, Take 200 Units by mouth 2 (two) times daily., Disp: , Rfl:    zinc gluconate 50 MG tablet, Take 50 mg by mouth daily., Disp: , Rfl:   Allergies  Allergen Reactions   Protamine Other (See Comments)    Severe hypotension refractory to pressor and volume administration, only reversed by administration of epinephrine.   Ace Inhibitors Other (See Comments)    UNKNOWN REACTION   Duloxetine Other (See Comments)    Drowsiness    I personally reviewed active problem list, medication list, allergies, family history, social history, health maintenance with the patient/caregiver today.   ROS  Ten systems reviewed and is negative except as mentioned in HPI   Objective  Vitals:   02/23/22 1112  BP: 126/78  Pulse: 76  Resp: 18  Temp: 97.7 F (36.5 C)  TempSrc: Oral  SpO2: 98%  Weight: 217 lb (98.4 kg)  Height: 5' 8.5" (1.74 m)    Body mass index is 32.51 kg/m.  Physical Exam  Constitutional: Patient appears well-developed and well-nourished. Obese  No distress.  HEENT: head atraumatic, normocephalic, pupils equal and reactive to light, normal TM, neck supple, throat within normal limits Cardiovascular: Normal rate, regular rhythm and normal heart sounds.  No murmur heard. No BLE edema. Pulmonary/Chest: Effort normal and breath sounds normal. No respiratory distress. Abdominal: Soft.  There is no tenderness. Psychiatric: Patient has a normal mood and affect. behavior is normal. Judgment and thought content normal.    PHQ2/9:    02/23/2022    11:14 AM 01/31/2022    1:44 PM 12/01/2021    1:57 PM 09/12/2021   10:53 AM 07/28/2021   10:31 AM  Depression screen PHQ 2/9  Decreased Interest 0 0 1 0 0  Down, Depressed, Hopeless 0 0 0 0 1  PHQ - 2 Score 0 0 1 0 1  Altered sleeping 0 0 0 0 0  Tired, decreased energy 0 0 3 0 1  Change in appetite 0 0 0 0 0  Feeling bad or failure about yourself  0 0 0 0 0  Trouble concentrating 0 0 0 0 0  Moving slowly or fidgety/restless 0 0 0 0 0  Suicidal thoughts 0 0 0 0 0  PHQ-9 Score 0 0 4 0 2    phq 9 is negative   Fall Risk:    02/23/2022   11:14 AM 01/31/2022    1:44 PM 12/01/2021    1:57 PM 07/28/2021   10:31 AM 12/23/2020   10:58 AM  Fall Risk   Falls in the past year? 0 0 0 0 0  Number falls in past yr:  0 0 0 0  Injury with Fall?  0 0 0 0  Risk for fall due to : No Fall Risks No Fall Risks No Fall Risks No Fall Risks No Fall Risks  Follow up Falls prevention discussed Falls prevention discussed Falls prevention discussed Falls prevention discussed Falls prevention discussed      Functional Status Survey: Is the patient deaf or have difficulty hearing?: No Does the patient have difficulty seeing, even when wearing glasses/contacts?: No Does the patient have difficulty concentrating, remembering, or making decisions?: No Does the patient have difficulty walking or climbing stairs?: No Does the patient have difficulty dressing or bathing?: No Does the patient have difficulty doing errands alone such as visiting a doctor's office or shopping?: No    Assessment & Plan  1. Generalized body aches  - POCT Influenza A/B - CBC with Differential/Platelet  2. Fever, unspecified fever cause  - POCT Influenza A/B - CBC with Differential/Platelet  3. Need for immunization against influenza  - Flu Vaccine QUAD 6+ mos PF IM (Fluarix Quad PF)   4. S/P TAVR (transcatheter aortic valve replacement)  We will check CBC due to previous history of valve infection and recently  replaced   Offered to send her to [facility] to have stat CBC but she refused to go there but agreed on checking Mychart results tomorrow morning and if WBC is elevated she will go to [facility] for further evaluation    5. Paroxysmal atrial fibrillation ([place])  Sending one month supply until she sees cardiologist, she will contact them directly  - diltiazem (CARDIZEM CD) 120 MG 24 hr capsule; Take 1 capsule (120 mg total) by mouth daily.  Dispense: 30 capsule; Refill: 0 - amiodarone (PACERONE) 200 MG tablet; Take 1 tablet (200 mg total) by mouth daily.  Dispense: 30 tablet; Refill: 0

## 2022-02-23 ENCOUNTER — Ambulatory Visit (INDEPENDENT_AMBULATORY_CARE_PROVIDER_SITE_OTHER): Payer: Medicare HMO | Admitting: Family Medicine

## 2022-02-23 ENCOUNTER — Encounter: Payer: Self-pay | Admitting: Family Medicine

## 2022-02-23 VITALS — BP 126/78 | HR 76 | Temp 97.7°F | Resp 18 | Ht 68.5 in | Wt 217.0 lb

## 2022-02-23 DIAGNOSIS — R509 Fever, unspecified: Secondary | ICD-10-CM

## 2022-02-23 DIAGNOSIS — I110 Hypertensive heart disease with heart failure: Secondary | ICD-10-CM | POA: Diagnosis not present

## 2022-02-23 DIAGNOSIS — Z952 Presence of prosthetic heart valve: Secondary | ICD-10-CM | POA: Diagnosis not present

## 2022-02-23 DIAGNOSIS — R52 Pain, unspecified: Secondary | ICD-10-CM | POA: Diagnosis not present

## 2022-02-23 DIAGNOSIS — I951 Orthostatic hypotension: Secondary | ICD-10-CM | POA: Diagnosis not present

## 2022-02-23 DIAGNOSIS — D649 Anemia, unspecified: Secondary | ICD-10-CM | POA: Diagnosis not present

## 2022-02-23 DIAGNOSIS — I5042 Chronic combined systolic (congestive) and diastolic (congestive) heart failure: Secondary | ICD-10-CM | POA: Diagnosis not present

## 2022-02-23 DIAGNOSIS — Z23 Encounter for immunization: Secondary | ICD-10-CM | POA: Diagnosis not present

## 2022-02-23 DIAGNOSIS — I480 Paroxysmal atrial fibrillation: Secondary | ICD-10-CM

## 2022-02-23 DIAGNOSIS — T826XXA Infection and inflammatory reaction due to cardiac valve prosthesis, initial encounter: Secondary | ICD-10-CM | POA: Diagnosis not present

## 2022-02-23 DIAGNOSIS — E1165 Type 2 diabetes mellitus with hyperglycemia: Secondary | ICD-10-CM | POA: Diagnosis not present

## 2022-02-23 DIAGNOSIS — B952 Enterococcus as the cause of diseases classified elsewhere: Secondary | ICD-10-CM | POA: Diagnosis not present

## 2022-02-23 DIAGNOSIS — F339 Major depressive disorder, recurrent, unspecified: Secondary | ICD-10-CM | POA: Diagnosis not present

## 2022-02-23 DIAGNOSIS — E782 Mixed hyperlipidemia: Secondary | ICD-10-CM | POA: Diagnosis not present

## 2022-02-23 DIAGNOSIS — I890 Lymphedema, not elsewhere classified: Secondary | ICD-10-CM | POA: Diagnosis not present

## 2022-02-23 DIAGNOSIS — I38 Endocarditis, valve unspecified: Secondary | ICD-10-CM | POA: Diagnosis not present

## 2022-02-23 DIAGNOSIS — I708 Atherosclerosis of other arteries: Secondary | ICD-10-CM | POA: Diagnosis not present

## 2022-02-23 LAB — CBC WITH DIFFERENTIAL/PLATELET
Absolute Monocytes: 737 cells/uL (ref 200–950)
Basophils Absolute: 60 cells/uL (ref 0–200)
Basophils Relative: 0.9 %
Eosinophils Absolute: 80 cells/uL (ref 15–500)
Eosinophils Relative: 1.2 %
HCT: 42.2 % (ref 35.0–45.0)
Hemoglobin: 14.2 g/dL (ref 11.7–15.5)
Lymphs Abs: 1420 cells/uL (ref 850–3900)
MCH: 32.6 pg (ref 27.0–33.0)
MCHC: 33.6 g/dL (ref 32.0–36.0)
MCV: 96.8 fL (ref 80.0–100.0)
MPV: 9.8 fL (ref 7.5–12.5)
Monocytes Relative: 11 %
Neutro Abs: 4402 cells/uL (ref 1500–7800)
Neutrophils Relative %: 65.7 %
Platelets: 337 10*3/uL (ref 140–400)
RBC: 4.36 10*6/uL (ref 3.80–5.10)
RDW: 13.1 % (ref 11.0–15.0)
Total Lymphocyte: 21.2 %
WBC: 6.7 10*3/uL (ref 3.8–10.8)

## 2022-02-23 LAB — POCT INFLUENZA A/B
Influenza A, POC: NEGATIVE
Influenza B, POC: NEGATIVE

## 2022-02-23 MED ORDER — AMIODARONE HCL 200 MG PO TABS: 200.0000 mg | ORAL_TABLET | Freq: Every day | ORAL | 0 refills | Status: AC

## 2022-02-23 MED ORDER — DILTIAZEM HCL ER COATED BEADS 120 MG PO CP24: 120.0000 mg | ORAL_CAPSULE | Freq: Every day | ORAL | 0 refills | Status: DC

## 2022-02-24 DIAGNOSIS — I38 Endocarditis, valve unspecified: Secondary | ICD-10-CM | POA: Diagnosis not present

## 2022-02-24 DIAGNOSIS — B952 Enterococcus as the cause of diseases classified elsewhere: Secondary | ICD-10-CM | POA: Diagnosis not present

## 2022-02-24 DIAGNOSIS — D649 Anemia, unspecified: Secondary | ICD-10-CM | POA: Diagnosis not present

## 2022-02-24 DIAGNOSIS — I890 Lymphedema, not elsewhere classified: Secondary | ICD-10-CM | POA: Diagnosis not present

## 2022-02-24 DIAGNOSIS — I708 Atherosclerosis of other arteries: Secondary | ICD-10-CM | POA: Diagnosis not present

## 2022-02-24 DIAGNOSIS — E1165 Type 2 diabetes mellitus with hyperglycemia: Secondary | ICD-10-CM | POA: Diagnosis not present

## 2022-02-24 DIAGNOSIS — I951 Orthostatic hypotension: Secondary | ICD-10-CM | POA: Diagnosis not present

## 2022-02-24 DIAGNOSIS — E782 Mixed hyperlipidemia: Secondary | ICD-10-CM | POA: Diagnosis not present

## 2022-02-24 DIAGNOSIS — I480 Paroxysmal atrial fibrillation: Secondary | ICD-10-CM | POA: Diagnosis not present

## 2022-02-24 DIAGNOSIS — I5042 Chronic combined systolic (congestive) and diastolic (congestive) heart failure: Secondary | ICD-10-CM | POA: Diagnosis not present

## 2022-02-24 DIAGNOSIS — T826XXA Infection and inflammatory reaction due to cardiac valve prosthesis, initial encounter: Secondary | ICD-10-CM | POA: Diagnosis not present

## 2022-02-24 DIAGNOSIS — F339 Major depressive disorder, recurrent, unspecified: Secondary | ICD-10-CM | POA: Diagnosis not present

## 2022-02-24 DIAGNOSIS — I110 Hypertensive heart disease with heart failure: Secondary | ICD-10-CM | POA: Diagnosis not present

## 2022-02-25 DIAGNOSIS — I708 Atherosclerosis of other arteries: Secondary | ICD-10-CM | POA: Diagnosis not present

## 2022-02-25 DIAGNOSIS — I480 Paroxysmal atrial fibrillation: Secondary | ICD-10-CM | POA: Diagnosis not present

## 2022-02-25 DIAGNOSIS — E1165 Type 2 diabetes mellitus with hyperglycemia: Secondary | ICD-10-CM | POA: Diagnosis not present

## 2022-02-25 DIAGNOSIS — E782 Mixed hyperlipidemia: Secondary | ICD-10-CM | POA: Diagnosis not present

## 2022-02-25 DIAGNOSIS — F339 Major depressive disorder, recurrent, unspecified: Secondary | ICD-10-CM | POA: Diagnosis not present

## 2022-02-25 DIAGNOSIS — I951 Orthostatic hypotension: Secondary | ICD-10-CM | POA: Diagnosis not present

## 2022-02-25 DIAGNOSIS — I110 Hypertensive heart disease with heart failure: Secondary | ICD-10-CM | POA: Diagnosis not present

## 2022-02-25 DIAGNOSIS — I5042 Chronic combined systolic (congestive) and diastolic (congestive) heart failure: Secondary | ICD-10-CM | POA: Diagnosis not present

## 2022-02-25 DIAGNOSIS — I38 Endocarditis, valve unspecified: Secondary | ICD-10-CM | POA: Diagnosis not present

## 2022-02-25 DIAGNOSIS — T826XXA Infection and inflammatory reaction due to cardiac valve prosthesis, initial encounter: Secondary | ICD-10-CM | POA: Diagnosis not present

## 2022-02-25 DIAGNOSIS — B952 Enterococcus as the cause of diseases classified elsewhere: Secondary | ICD-10-CM | POA: Diagnosis not present

## 2022-02-25 DIAGNOSIS — D649 Anemia, unspecified: Secondary | ICD-10-CM | POA: Diagnosis not present

## 2022-02-25 DIAGNOSIS — I890 Lymphedema, not elsewhere classified: Secondary | ICD-10-CM | POA: Diagnosis not present

## 2022-02-26 DIAGNOSIS — I38 Endocarditis, valve unspecified: Secondary | ICD-10-CM | POA: Diagnosis not present

## 2022-02-26 DIAGNOSIS — T826XXA Infection and inflammatory reaction due to cardiac valve prosthesis, initial encounter: Secondary | ICD-10-CM | POA: Diagnosis not present

## 2022-02-26 DIAGNOSIS — E1165 Type 2 diabetes mellitus with hyperglycemia: Secondary | ICD-10-CM | POA: Diagnosis not present

## 2022-02-26 DIAGNOSIS — I708 Atherosclerosis of other arteries: Secondary | ICD-10-CM | POA: Diagnosis not present

## 2022-02-26 DIAGNOSIS — I110 Hypertensive heart disease with heart failure: Secondary | ICD-10-CM | POA: Diagnosis not present

## 2022-02-26 DIAGNOSIS — I480 Paroxysmal atrial fibrillation: Secondary | ICD-10-CM | POA: Diagnosis not present

## 2022-02-26 DIAGNOSIS — D649 Anemia, unspecified: Secondary | ICD-10-CM | POA: Diagnosis not present

## 2022-02-26 DIAGNOSIS — B952 Enterococcus as the cause of diseases classified elsewhere: Secondary | ICD-10-CM | POA: Diagnosis not present

## 2022-02-26 DIAGNOSIS — I951 Orthostatic hypotension: Secondary | ICD-10-CM | POA: Diagnosis not present

## 2022-02-26 DIAGNOSIS — I890 Lymphedema, not elsewhere classified: Secondary | ICD-10-CM | POA: Diagnosis not present

## 2022-02-26 DIAGNOSIS — F339 Major depressive disorder, recurrent, unspecified: Secondary | ICD-10-CM | POA: Diagnosis not present

## 2022-02-26 DIAGNOSIS — I5042 Chronic combined systolic (congestive) and diastolic (congestive) heart failure: Secondary | ICD-10-CM | POA: Diagnosis not present

## 2022-02-26 DIAGNOSIS — E782 Mixed hyperlipidemia: Secondary | ICD-10-CM | POA: Diagnosis not present

## 2022-02-27 ENCOUNTER — Encounter: Payer: Self-pay | Admitting: Dermatology

## 2022-02-27 ENCOUNTER — Ambulatory Visit (INDEPENDENT_AMBULATORY_CARE_PROVIDER_SITE_OTHER): Payer: Medicare HMO | Admitting: Dermatology

## 2022-02-27 VITALS — BP 130/71 | HR 67

## 2022-02-27 DIAGNOSIS — L821 Other seborrheic keratosis: Secondary | ICD-10-CM | POA: Diagnosis not present

## 2022-02-27 DIAGNOSIS — L578 Other skin changes due to chronic exposure to nonionizing radiation: Secondary | ICD-10-CM | POA: Diagnosis not present

## 2022-02-27 DIAGNOSIS — L814 Other melanin hyperpigmentation: Secondary | ICD-10-CM

## 2022-02-27 DIAGNOSIS — B070 Plantar wart: Secondary | ICD-10-CM | POA: Diagnosis not present

## 2022-02-27 DIAGNOSIS — Z1283 Encounter for screening for malignant neoplasm of skin: Secondary | ICD-10-CM

## 2022-02-27 DIAGNOSIS — D229 Melanocytic nevi, unspecified: Secondary | ICD-10-CM

## 2022-02-27 DIAGNOSIS — L738 Other specified follicular disorders: Secondary | ICD-10-CM | POA: Diagnosis not present

## 2022-02-27 DIAGNOSIS — L820 Inflamed seborrheic keratosis: Secondary | ICD-10-CM

## 2022-02-27 DIAGNOSIS — Z85820 Personal history of malignant melanoma of skin: Secondary | ICD-10-CM

## 2022-02-27 DIAGNOSIS — L570 Actinic keratosis: Secondary | ICD-10-CM | POA: Diagnosis not present

## 2022-02-27 NOTE — Progress Notes (Signed)
New Patient Visit  Subjective  Melody Soto is a 72 y.o. female who presents for the following: Skin Problem (Spot on left forearm. Pink, scaly. Bleeds if scratched) and Annual Exam (Skin cancer screening and establish care. Hx of MM left upper arm, ~20 years ago).  She also has irritated spots at braline and at hand.  Scaly spot on her lip.  She also has a wart on her foot that is painful  The patient presents for Total-Body Skin Exam (TBSE) for skin cancer screening and mole check.  The patient has spots, moles and lesions to be evaluated, some may be new or changing and the patient has concerns that these could be cancer.   Review of Systems: No other skin or systemic complaints except as noted in HPI or Assessment and Plan.   Objective  Well appearing patient in no apparent distress; mood and affect are within normal limits.  A full examination was performed including scalp, head, eyes, ears, nose, lips, neck, chest, axillae, abdomen, back, buttocks, bilateral upper extremities, bilateral lower extremities, hands, feet, fingers, toes, fingernails, and toenails. All findings within normal limits unless otherwise noted below.  Left Dorsal Forearm x1, left posterior flank at bra line x1, left thenar hand x1 (3) Erythematous scaly patch  Right Lower Vermilion Lip x1 Erythematous thin papules/macules with gritty scale.   Left Medial Cheek 4 mm yellow-pink papule  Left plantar foot x1 5 mm keratotic firm depressed papule   Assessment & Plan   History of Melanoma. Left upper arm, years ago - No evidence of recurrence today - Recommend regular full body skin exams - Recommend daily broad spectrum sunscreen SPF 30+ to sun-exposed areas, reapply every 2 hours as needed.  - Call if any new or changing lesions are noted between office visits   Lentigines. Face, torso, arms, legs.  - Scattered tan macules - Due to sun exposure - Benign-appearing, observe - Recommend daily  broad spectrum sunscreen SPF 30+ to sun-exposed areas, reapply every 2 hours as needed. - Call for any changes  Seborrheic Keratoses. Torso. Right breast. Legs - Stuck-on, waxy, tan-brown papules and/or plaques  - Benign-appearing - Discussed benign etiology and prognosis. - Observe - Call for any changes  Melanocytic Nevi - Tan-brown and/or pink-flesh-colored symmetric macules and papules - Benign appearing on exam today - Observation - Call clinic for new or changing moles - Recommend daily use of broad spectrum spf 30+ sunscreen to sun-exposed areas.   Hemangiomas. Torso.  - Red papules - Discussed benign nature - Observe - Call for any changes  Actinic Damage - Chronic condition, secondary to cumulative UV/sun exposure - diffuse scaly erythematous macules with underlying dyspigmentation - Recommend daily broad spectrum sunscreen SPF 30+ to sun-exposed areas, reapply every 2 hours as needed.  - Staying in the shade or wearing long sleeves, sun glasses (UVA+UVB protection) and wide brim hats (4-inch brim around the entire circumference of the hat) are also recommended for sun protection.  - Call for new or changing lesions.  Skin cancer screening performed today.  Inflamed seborrheic keratosis (3) Left Dorsal Forearm x1, left posterior flank at bra line x1, left thenar hand x1  Symptomatic, irritating, patient would like treated.  Destruction of lesion - Left Dorsal Forearm x1, left posterior flank at bra line x1, left thenar hand x1  Destruction method: cryotherapy   Informed consent: discussed and consent obtained   Lesion destroyed using liquid nitrogen: Yes   Region frozen until ice ball  extended beyond lesion: Yes   Outcome: patient tolerated procedure well with no complications   Post-procedure details: wound care instructions given   Additional details:  Prior to procedure, discussed risks of blister formation, small wound, skin dyspigmentation, or rare scar  following cryotherapy. Recommend Vaseline ointment to treated areas while healing.   AK (actinic keratosis) Right Lower Vermilion Lip x1  Actinic keratoses are precancerous spots that appear secondary to cumulative UV radiation exposure/sun exposure over time. They are chronic with expected duration over 1 year. A portion of actinic keratoses will progress to squamous cell carcinoma of the skin. It is not possible to reliably predict which spots will progress to skin cancer and so treatment is recommended to prevent development of skin cancer.  Recommend daily broad spectrum sunscreen SPF 30+ to sun-exposed areas, reapply every 2 hours as needed.  Recommend staying in the shade or wearing long sleeves, sun glasses (UVA+UVB protection) and wide brim hats (4-inch brim around the entire circumference of the hat). Call for new or changing lesions.  Destruction of lesion - Right Lower Vermilion Lip x1  Destruction method: cryotherapy   Informed consent: discussed and consent obtained   Lesion destroyed using liquid nitrogen: Yes   Region frozen until ice ball extended beyond lesion: Yes   Outcome: patient tolerated procedure well with no complications   Post-procedure details: wound care instructions given   Additional details:  Prior to procedure, discussed risks of blister formation, small wound, skin dyspigmentation, or rare scar following cryotherapy. Recommend Vaseline ointment to treated areas while healing.   Sebaceous hyperplasia of face Left Medial Cheek  Benign-appearing.  Observation.  Call clinic for new or changing lesions.  Recommend daily use of broad spectrum spf 30+ sunscreen to sun-exposed areas.    Plantar wart Left plantar foot x1  Viral Wart (HPV) Counseling  Discussed viral / HPV (Human Papilloma Virus) etiology and risk of spread /infectivity to other areas of body as well as to other people.  Multiple treatments and methods may be required to clear warts and it is  possible treatment may not be successful.  Treatment risks include discoloration; scarring and there is still potential for wart recurrence.  With lymphedema healing will take longer.   Recommend using Curad Mediplast pads. Cut to fit wart or callus. Cover with Elastoplast waterproof tape or any waterproof band-aid. Change every 3 to 4 days, or sooner if necessary.  Treatment may require several months of regular use before results are seen.   Destruction of lesion - Left plantar foot x1  Destruction method: cryotherapy   Informed consent: discussed and consent obtained   Debridement: hyperkeratotic portion removed with sharp debridement   Lesion destroyed using liquid nitrogen: Yes   Region frozen until ice ball extended beyond lesion: Yes   Outcome: patient tolerated procedure well with no complications   Post-procedure details: wound care instructions given   Additional details:  Prior to procedure, discussed risks of blister formation, small wound, skin dyspigmentation, or rare scar following cryotherapy. Recommend Vaseline ointment to treated areas while healing.    Return in about 1 year (around 02/28/2023) for TBSE, wart/ISK in 2 months .  I, Emelia Salisbury, CMA, am acting as scribe for Brendolyn Patty, MD.  Documentation: I have reviewed the above documentation for accuracy and completeness, and I agree with the above.  Brendolyn Patty MD

## 2022-02-27 NOTE — Patient Instructions (Addendum)
Cryotherapy Aftercare  Wash gently with soap and water everyday.   Apply Vaseline daily until healed.     Wart on foot: Recommend using Curad Mediplast pads. Cut to fit wart or callus. Cover with Elastoplast waterproof tape or any waterproof band-aid. Change every 3 to 4 days, or sooner if necessary.  Treatment may require several months of regular use before results are seen.    Recommend daily broad spectrum sunscreen SPF 30+ to sun-exposed areas, reapply every 2 hours as needed. Call for new or changing lesions.  Staying in the shade or wearing long sleeves, sun glasses (UVA+UVB protection) and wide brim hats (4-inch brim around the entire circumference of the hat) are also recommended for sun protection.    Seborrheic Keratosis  What causes seborrheic keratoses? Seborrheic keratoses are harmless, common skin growths that first appear during adult life.  As time goes by, more growths appear.  Some people may develop a large number of them.  Seborrheic keratoses appear on both covered and uncovered body parts.  They are not caused by sunlight.  The tendency to develop seborrheic keratoses can be inherited.  They vary in color from skin-colored to gray, brown, or even black.  They can be either smooth or have a rough, warty surface.   Seborrheic keratoses are superficial and look as if they were stuck on the skin.  Under the microscope this type of keratosis looks like layers upon layers of skin.  That is why at times the top layer may seem to fall off, but the rest of the growth remains and re-grows.    Treatment Seborrheic keratoses do not need to be treated, but can easily be removed in the office.  Seborrheic keratoses often cause symptoms when they rub on clothing or jewelry.  Lesions can be in the way of shaving.  If they become inflamed, they can cause itching, soreness, or burning.  Removal of a seborrheic keratosis can be accomplished by freezing, burning, or surgery. If any spot  bleeds, scabs, or grows rapidly, please return to have it checked, as these can be an indication of a skin cancer.  Recommend starting moisturizer with exfoliant (Urea, Salicylic acid, or Lactic acid) one to two times daily to help smooth rough and bumpy skin.  OTC options include Cetaphil Rough and Bumpy lotion (Urea), Eucerin Roughness Relief lotion or spot treatment cream (Urea), CeraVe SA lotion/cream for Rough and Bumpy skin (Sal Acid), Gold Bond Rough and Bumpy cream (Sal Acid), and AmLactin 12% lotion/cream (Lactic Acid).  If applying in morning, also apply sunscreen to sun-exposed areas, since these exfoliating moisturizers can increase sensitivity to sun.    Melanoma ABCDEs  Melanoma is the most dangerous type of skin cancer, and is the leading cause of death from skin disease.  You are more likely to develop melanoma if you: Have light-colored skin, light-colored eyes, or red or blond hair Spend a lot of time in the sun Tan regularly, either outdoors or in a tanning bed Have had blistering sunburns, especially during childhood Have a close family member who has had a melanoma Have atypical moles or large birthmarks  Early detection of melanoma is key since treatment is typically straightforward and cure rates are extremely high if we catch it early.   The first sign of melanoma is often a change in a mole or a new dark spot.  The ABCDE system is a way of remembering the signs of melanoma.  A for asymmetry:  The two halves  do not match. B for border:  The edges of the growth are irregular. C for color:  A mixture of colors are present instead of an even brown color. D for diameter:  Melanomas are usually (but not always) greater than 34m - the size of a pencil eraser. E for evolution:  The spot keeps changing in size, shape, and color.  Please check your skin once per month between visits. You can use a small mirror in front and a large mirror behind you to keep an eye on the back  side or your body.   If you see any new or changing lesions before your next follow-up, please call to schedule a visit.  Please continue daily skin protection including broad spectrum sunscreen SPF 30+ to sun-exposed areas, reapplying every 2 hours as needed when you're outdoors.   Staying in the shade or wearing long sleeves, sun glasses (UVA+UVB protection) and wide brim hats (4-inch brim around the entire circumference of the hat) are also recommended for sun protection.    Due to recent changes in healthcare laws, you may see results of your pathology and/or laboratory studies on MyChart before the doctors have had a chance to review them. We understand that in some cases there may be results that are confusing or concerning to you. Please understand that not all results are received at the same time and often the doctors may need to interpret multiple results in order to provide you with the best plan of care or course of treatment. Therefore, we ask that you please give u[place]2 business days to thoroughly review all your results before contacting the office for clarification. Should we see a critical lab result, you will be contacted sooner.   If You Need Anything After Your Visit  If you have any questions or concerns for your doctor, please call our main line at [phone] press option 4 to reach your doctor's medical assistant. If no one answers, please leave a voicemail as directed and we will return your call as soon as possible. Messages left after 4 pm will be answered the following business day.   You may also send [place] message via [facility] [place] We typically respond to MyChart messages within 1-2 business days.  For prescription refills, please ask your pharmacy to contact our office. Our fax number is [phone]  If you have an urgent issue when the clinic is closed that cannot wait until the next business day, you can page your doctor at the number below.    Please note that  while we do our best to be available for urgent issues outside of office hours, we are not available 24/7.   If you have an urgent issue and are unable to reach u[place] you may choose to seek medical care at your doctor's office, retail clinic, urgent care center, or emergency room.  If you have a medical emergency, please immediately call 911 or go to the emergency department.  Pager Numbers  - Dr. KNehemiah Massed [phone] - Dr. MLaurence Ferrari [phone] - Dr. SNicole Kindred [phone] In the event of inclement weather, please call our main line at [phone] an update on the status of any delays or closures.  Dermatology Medication Tips: Please keep the boxes that topical medications come in in order to help keep track of the instructions about where and how to use these. Pharmacies typically print the medication instructions only on the boxes and not directly on the medication tubes.  If your medication is too expensive, please contact our office at [phone] option 4 or send [place] a message through [employer].   We are unable to tell what your co-pay for medications will be in advance as this is different depending on your insurance coverage. However, we may be able to find a substitute medication at lower cost or fill out paperwork to get insurance to cover a needed medication.   If a prior authorization is required to get your medication covered by your insurance company, please allow [place] 1-2 business days to complete this process.  Drug prices often vary depending on where the prescription is filled and some pharmacies may offer cheaper prices.  The website www.goodrx.com contains coupons for medications through different pharmacies. The prices here do not account for what the cost may be with help from insurance (it may be cheaper with your insurance), but the website can give you the price if you did not use any insurance.  - You can print the associated coupon and take it with your  prescription to the pharmacy.  - You may also stop by our office during regular business hours and pick up a GoodRx coupon card.  - If you need your prescription sent electronically to a different pharmacy, notify our office through [facility] or by phone at [phone] option 4.     Si Usted Necesita Algo Despus de Su Visita  Tambin puede enviarnos un mensaje a travs de Pharmacist, community. Por lo general respondemos a los mensajes de MyChart en el transcurso de 1 a 2 das hbiles.  Para renovar recetas, por favor pida a su farmacia que se ponga en contacto con nuestra oficina. Harland Dingwall de fax es [place] [phone].  Si tiene un asunto urgente cuando la clnica est cerrada y que no puede esperar hasta el siguiente da hbil, puede llamar/localizar a su doctor(a) al nmero que aparece a continuacin.   Por favor, tenga en cuenta que aunque hacemos todo lo posible para estar disponibles para asuntos urgentes fuera del horario de [place], no estamos disponibles las 24 horas del da, los 7 das de la [place].   Si tiene un problema urgente y no puede comunicarse con nosotros, puede optar por buscar atencin mdica  en el consultorio de su doctor(a), en una clnica privada, en un centro de atencin urgente o en una sala de emergencias.  Si tiene Engineering geologist, por favor llame inmediatamente al 911 o vaya a la sala de emergencias.  Nmeros de bper  - Dr. Nehemiah Massed: [phone]  - Dra. Moye: [phone]  - Dra. Nicole Kindred: [phone]  En caso de inclemencias del [place], por favor llame a Johnsie Kindred principal al [phone] para una actualizacin sobre el [place] de cualquier retraso o cierre.  Consejos para la medicacin en dermatologa: Por favor, guarde las cajas en las que vienen los medicamentos de uso tpico para ayudarle a seguir las instrucciones sobre dnde y cmo usarlos. Las farmacias generalmente imprimen las instrucciones del medicamento slo en las cajas y no  directamente en los tubos del [place].   Si su medicamento es muy caro, por favor, pngase en contacto con Zigmund Daniel llamando al [phone] y presione la opcin 4 o envenos un mensaje a travs de Pharmacist, community.   No podemos decirle cul ser su copago por los medicamentos por adelantado ya que esto es diferente dependiendo de la cobertura de su seguro. Sin embargo, es posible que podamos encontrar un medicamento sustituto a Geneticist, molecular  formulario para que el seguro cubra el medicamento que se considera necesario.   Si se requiere una autorizacin previa para que su compaa de seguros [place] su medicamento, por favor permtanos de 1 a 2 das hbiles para completar este proceso.  Los precios de los medicamentos varan con frecuencia dependiendo del Environmental consultant de dnde se surte la receta y alguna farmacias pueden ofrecer precios ms baratos.  El sitio web www.goodrx.com tiene cupones para medicamentos de Airline pilot. Los precios aqu no tienen en cuenta lo que podra costar con la ayuda del seguro (puede ser ms barato con su seguro), pero el sitio web puede darle el precio si no utiliz Research scientist (physical sciences).  - Puede imprimir el cupn correspondiente y llevarlo con su receta a la farmacia.  - Tambin puede pasar por nuestra oficina durante el horario de atencin regular y Charity fundraiser una tarjeta de cupones de GoodRx.  - Si necesita que su receta se enve electrnicamente a una farmacia diferente, informe a nuestra oficina a travs de MyChart de [place] o por telfono llamando al [phone] y presione la opcin 4.

## 2022-02-28 ENCOUNTER — Encounter: Payer: Medicare HMO | Attending: Internal Medicine | Admitting: *Deleted

## 2022-02-28 VITALS — Ht 69.5 in | Wt 219.3 lb

## 2022-02-28 DIAGNOSIS — Z952 Presence of prosthetic heart valve: Secondary | ICD-10-CM | POA: Diagnosis not present

## 2022-02-28 NOTE — Progress Notes (Signed)
Cardiac Individual Treatment Plan  Patient Details  Name: Melody Soto MRN: [id number] Date of Birth: [birth year] Referring Provider:   Flowsheet Row Cardiac Rehab from 02/28/2022 in [facility] Cardiac and Pulmonary Rehab  Referring Provider Lujean Amel MD       Initial Encounter Date:  Flowsheet Row Cardiac Rehab from 02/28/2022 in [facility] Cardiac and Pulmonary Rehab  Date 02/28/22       Visit Diagnosis: S/P aortic valve replacement  Patient's Home Medications on Admission:  Current Outpatient Medications:    amiodarone (PACERONE) 200 MG tablet, Take 1 tablet (200 mg total) by mouth daily., Disp: 30 tablet, Rfl: 0   Ascorbic Acid (VITAMIN C) 1000 MG tablet, Take 1,000 mg by mouth in the morning and at bedtime., Disp: , Rfl:    aspirin 81 MG chewable tablet, Chew 81 mg by mouth daily., Disp: , Rfl:    atorvastatin (LIPITOR) 40 MG tablet, Take 1 tablet (40 mg total) by mouth 2 (two) times a week., Disp: 24 tablet, Rfl: 1   diltiazem (CARDIZEM CD) 120 MG 24 hr capsule, Take 1 capsule (120 mg total) by mouth daily., Disp: 30 capsule, Rfl: 0   furosemide (LASIX) 40 MG tablet, Take 20 mg by mouth daily., Disp: , Rfl:    JARDIANCE 10 MG TABS tablet, Take 10 mg by mouth daily., Disp: , Rfl:    Magnesium 500 MG CAPS, Take 800 mg by mouth in the morning and at bedtime. , Disp: , Rfl:    metoprolol succinate (TOPROL-XL) 50 MG 24 hr tablet, Take 150 mg by mouth daily., Disp: , Rfl:    Omega-3 Fatty Acids (FISH OIL) 1000 MG CAPS, Take 1 capsule by mouth daily., Disp: , Rfl:    Potassium Chloride ER 20 MEQ TBCR, Take 2 tablets by mouth daily., Disp: , Rfl:    Vitamin D-Vitamin K (VITAMIN K2-VITAMIN D3 PO), Take by mouth in the morning and at bedtime., Disp: , Rfl:    vitamin E 200 UNIT capsule, Take 200 Units by mouth 2 (two) times daily., Disp: , Rfl:    zinc gluconate 50 MG tablet, Take 50 mg by mouth daily., Disp: , Rfl:   Past Medical History: Past Medical History:  Diagnosis Date    Aortic stenosis    Cataract    Depression    Dysrhythmia    Endometrial cancer determined by uterine biopsy ([place]) 10/29/2016   Rad tx's and internal brachytherapy.   Heart murmur    HX OF   Hyperlipidemia    Hypertension    CONTROLLED ON MEDS   Lymphedema    Melanoma ([place])    Left upper arm. 20 years ago   Shortness of breath dyspnea     Tobacco Use: Social History   Tobacco Use  Smoking Status Never  Smokeless Tobacco Never    Labs: Review Flowsheet  More data exists      Latest Ref Rng & Units 09/24/2019 12/21/2019 06/21/2020 12/23/2020 07/28/2021  Labs for ITP Cardiac and Pulmonary Rehab  Cholestrol <200 mg/dL - - 199  - 222   LDL (calc) mg/dL (calc) - - 118  - 135   HDL-C > OR = 50 mg/dL - - 59  - 65   Trlycerides <150 mg/dL - - 113  - 112   Hemoglobin A1c <5.7 % of total Hgb - 5.9  5.5  5.5  5.5   TCO2 22 - 32 mmol/L 25  - - - -     Exercise Target Goals: Exercise  Program Goal: Individual exercise prescription set using results from initial 6 min walk test and THRR while considering  patient's activity barriers and safety.   Exercise Prescription Goal: Initial exercise prescription builds to 30-45 minutes a day of aerobic activity, 2-3 days per week.  Home exercise guidelines will be given to patient during program as part of exercise prescription that the participant will acknowledge.   Education: Aerobic Exercise: - Group verbal and visual presentation on the components of exercise prescription. Introduces F.I.T.T principle from ACSM for exercise prescriptions.  Reviews F.I.T.T. principles of aerobic exercise including progression. Written material given at graduation. Flowsheet Row Cardiac Rehab from 02/28/2022 in [facility] Cardiac and Pulmonary Rehab  Education need identified 02/28/22       Education: Resistance Exercise: - Group verbal and visual presentation on the components of exercise prescription. Introduces F.I.T.T principle from ACSM for exercise  prescriptions  Reviews F.I.T.T. principles of resistance exercise including progression. Written material given at graduation.    Education: Exercise & Equipment Safety: - Individual verbal instruction and demonstration of equipment use and safety with use of the equipment. Flowsheet Row Cardiac Rehab from 02/28/2022 in [facility] Cardiac and Pulmonary Rehab  Date 02/28/22  Educator NT  Instruction Review Code 1- Verbalizes Understanding       Education: Exercise Physiology & General Exercise Guidelines: - Group verbal and written instruction with models to review the exercise physiology of the cardiovascular system and associated critical values. Provides general exercise guidelines with specific guidelines to those with heart or lung disease.    Education: Flexibility, Balance, Mind/Body Relaxation: - Group verbal and visual presentation with interactive activity on the components of exercise prescription. Introduces F.I.T.T principle from ACSM for exercise prescriptions. Reviews F.I.T.T. principles of flexibility and balance exercise training including progression. Also discusses the mind body connection.  Reviews various relaxation techniques to help reduce and manage stress (i.e. Deep breathing, progressive muscle relaxation, and visualization). Balance handout provided to take home. Written material given at graduation.   Activity Barriers & Risk Stratification:  Activity Barriers & Cardiac Risk Stratification - 02/28/22 1539       Activity Barriers & Cardiac Risk Stratification   Activity Barriers Arthritis;Other (comment)    Comments hip pain, lymphodema in legs   R>L    Cardiac Risk Stratification Low             6 Minute Walk:  6 Minute Walk     Row Name 02/28/22 1538         6 Minute Walk   Phase Initial     Distance 1040 feet     Walk Time 6 minutes     # of Rest Breaks 0     MPH 1.97     METS 1.93     RPE 11     Perceived Dyspnea  0     VO2 Peak 6.75      Symptoms Yes (comment)     Comments Leg Fatigue     Resting HR 62 bpm     Resting BP 118/70     Resting Oxygen Saturation  95 %     Exercise Oxygen Saturation  during 6 min walk 95 %     Max Ex. HR 93 bpm     Max Ex. BP 150/82     2 Minute Post BP 124/80              Oxygen Initial Assessment:   Oxygen Re-Evaluation:   Oxygen Discharge (Final Oxygen  Re-Evaluation):   Initial Exercise Prescription:  Initial Exercise Prescription - 02/28/22 1500       Date of Initial Exercise RX and Referring Provider   Date 02/28/22    Referring Provider Lujean Amel MD      Oxygen   Maintain Oxygen Saturation 88% or higher      Treadmill   MPH 1    Grade 0    Minutes 15    METs 1.8      NuStep   Level 2    SPM 80    Minutes 15    METs 1.93      Biostep-RELP   Level 1    SPM 60    Minutes 15    METs 1.93      Track   Laps 16    Minutes 15    METs 1.87      Prescription Details   Frequency (times per week) 3    Duration Progress to 30 minutes of continuous aerobic without signs/symptoms of physical distress      Intensity   THRR 40-80% of Max Heartrate 97-132    Ratings of Perceived Exertion 11-13    Perceived Dyspnea 0-4      Progression   Progression Continue to progress workloads to maintain intensity without signs/symptoms of physical distress.      Resistance Training   Training Prescription Yes    Weight 3 lb    Reps 10-15             Perform Capillary Blood Glucose checks as needed.  Exercise Prescription Changes:   Exercise Prescription Changes     Row Name 02/28/22 1500             Response to Exercise   Blood Pressure (Admit) 118/70       Blood Pressure (Exercise) 150/82       Blood Pressure (Exit) 124/80       Heart Rate (Admit) 62 bpm       Heart Rate (Exercise) 93 bpm       Heart Rate (Exit) 66 bpm       Oxygen Saturation (Admit) 95 %       Oxygen Saturation (Exercise) 95 %       Oxygen Saturation (Exit) 97 %        Rating of Perceived Exertion (Exercise) 11       Perceived Dyspnea (Exercise) 0       Symptoms leg fatigue       Comments 6MWT Results                Exercise Comments:   Exercise Comments     Row Name 02/28/22 0931           Exercise Comments Virtual orientation call completed today. shehas an appointment on Date: 02/28/22 for EP eval and gym Orientation. Documentation of diagnosis [facility] be [facility] in [facility] Date: 12/07/2021 . 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                Exercise Goals and Review:   Exercise Goals     Row Name 02/28/22 1540             Exercise Goals   Increase Physical Activity Yes       Intervention Provide advice, education, support and counseling about physical activity/exercise needs.;Develop an individualized exercise prescription for aerobic and resistive training  based on initial evaluation findings, risk stratification, comorbidities and participant's personal goals.       Expected Outcomes Short Term: Attend rehab on a regular basis to increase amount of physical activity.;Long Term: Exercising regularly at least 3-5 days a week.;Long Term: Add in home exercise to make exercise part of routine and to increase amount of physical activity.       Increase Strength and Stamina Yes       Intervention Provide advice, education, support and counseling about physical activity/exercise needs.;Develop an individualized exercise prescription for aerobic and resistive training based on initial evaluation findings, risk stratification, comorbidities and participant's personal goals.       Expected Outcomes Short Term: Perform resistance training exercises routinely during rehab and add in resistance training at home;Short Term: Increase workloads from initial exercise prescription for resistance, speed, and METs.;Long Term: Improve cardiorespiratory fitness, muscular  endurance and strength as measured by increased METs and functional capacity (6MWT)       Able to understand and use rate of perceived exertion (RPE) scale Yes       Intervention Provide education and explanation on how to use RPE scale       Expected Outcomes Short Term: Able to use RPE daily in rehab to express subjective intensity level;Long Term:  Able to use RPE to guide intensity level when exercising independently       Able to understand and use Dyspnea scale Yes       Intervention Provide education and explanation on how to use Dyspnea scale       Expected Outcomes Short Term: Able to use Dyspnea scale daily in rehab to express subjective sense of shortness of breath during exertion;Long Term: Able to use Dyspnea scale to guide intensity level when exercising independently       Knowledge and understanding of Target Heart Rate Range (THRR) Yes       Intervention Provide education and explanation of THRR including how the numbers were predicted and where they are located for reference       Expected Outcomes Short Term: Able to state/look up THRR;Long Term: Able to use THRR to govern intensity when exercising independently;Short Term: Able to use daily as guideline for intensity in rehab       Able to check pulse independently Yes       Intervention Provide education and demonstration on how to check pulse in carotid and radial arteries.;Review the importance of being able to check your own pulse for safety during independent exercise       Expected Outcomes Short Term: Able to explain why pulse checking is important during independent exercise;Long Term: Able to check pulse independently and accurately       Understanding of Exercise Prescription Yes       Intervention Provide education, explanation, and written materials on patient's individual exercise prescription       Expected Outcomes Short Term: Able to explain program exercise prescription;Long Term: Able to explain home exercise  prescription to exercise independently                Exercise Goals Re-Evaluation :   Discharge Exercise Prescription (Final Exercise Prescription Changes):  Exercise Prescription Changes - 02/28/22 1500       Response to Exercise   Blood Pressure (Admit) 118/70    Blood Pressure (Exercise) 150/82    Blood Pressure (Exit) 124/80    Heart Rate (Admit) 62 bpm    Heart Rate (Exercise) 93 bpm  Heart Rate (Exit) 66 bpm    Oxygen Saturation (Admit) 95 %    Oxygen Saturation (Exercise) 95 %    Oxygen Saturation (Exit) 97 %    Rating of Perceived Exertion (Exercise) 11    Perceived Dyspnea (Exercise) 0    Symptoms leg fatigue    Comments 6MWT Results             Nutrition:  Target Goals: Understanding of nutrition guidelines, daily intake of sodium '1500mg'$ , cholesterol '200mg'$ , calories 30% from fat and 7% or less from saturated fats, daily to have 5 or more servings of fruits and vegetables.  Education: All About Nutrition: -Group instruction provided by verbal, written material, interactive activities, discussions, models, and posters to present general guidelines for heart healthy nutrition including fat, fiber, MyPlate, the role of sodium in heart healthy nutrition, utilization of the nutrition label, and utilization of this knowledge for meal planning. Follow up email sent as well. Written material given at graduation.   Biometrics:  Pre Biometrics - 02/28/22 1540       Pre Biometrics   Height 5' 9.5" (1.765 m)    Weight 219 lb 4.8 oz (99.5 kg)    Waist Circumference 36.5 inches    Hip Circumference 46 inches    Waist to Hip Ratio 0.79 %    BMI (Calculated) 31.93    Single Leg Stand 9.3 seconds   R             Nutrition Therapy Plan and Nutrition Goals:  Nutrition Therapy & Goals - 02/28/22 1531       Intervention Plan   Intervention Prescribe, educate and counsel regarding individualized specific dietary modifications aiming towards targeted core  components such as weight, hypertension, lipid management, diabetes, heart failure and other comorbidities.    Expected Outcomes Short Term Goal: A plan has been developed with personal nutrition goals set during dietitian appointment.;Short Term Goal: Understand basic principles of dietary content, such as calories, fat, sodium, cholesterol and nutrients.;Long Term Goal: Adherence to prescribed nutrition plan.             Nutrition Assessments:  MEDIFICTS Score Key: ?70 Need to make dietary changes  40-70 Heart Healthy Diet ? 40 Therapeutic Level Cholesterol Diet  Flowsheet Row Cardiac Rehab from 02/28/2022 in [facility] Cardiac and Pulmonary Rehab  Picture Your Plate Total Score on Admission 57      Picture Your Plate Scores: <67 Unhealthy dietary pattern with much room for improvement. 41-50 Dietary pattern unlikely to meet recommendations for good health and room for improvement. 51-60 More healthful dietary pattern, with some room for improvement.  >60 Healthy dietary pattern, although there may be some specific behaviors that could be improved.    Nutrition Goals Re-Evaluation:   Nutrition Goals Discharge (Final Nutrition Goals Re-Evaluation):   Psychosocial: Target Goals: Acknowledge presence or absence of significant depression and/or stress, maximize coping skills, provide positive support system. Participant is able to verbalize types and ability to use techniques and skills needed for reducing stress and depression.   Education: Stress, Anxiety, and Depression - Group verbal and visual presentation to define topics covered.  Reviews how body is impacted by stress, anxiety, and depression.  Also discusses healthy ways to reduce stress and to treat/manage anxiety and depression.  Written material given at graduation.   Education: Sleep Hygiene -Provides group verbal and written instruction about how sleep can affect your health.  Define sleep hygiene, discuss sleep cycles  and impact of sleep habits. Review  good sleep hygiene tips.    Initial Review & Psychosocial Screening:  Initial Psych Review & Screening - 02/28/22 0916       Initial Review   Current issues with None Identified      Family Dynamics   Good Support System? Yes   friends, daughter     Barriers   Psychosocial barriers to participate in program There are no identifiable barriers or psychosocial needs.      Screening Interventions   Interventions Encouraged to exercise;To provide support and resources with identified psychosocial needs;Provide feedback about the scores to participant    Expected Outcomes Short Term goal: Utilizing psychosocial counselor, staff and physician to assist with identification of specific Stressors or current issues interfering with healing process. Setting desired goal for each stressor or current issue identified.;Long Term Goal: Stressors or current issues are controlled or eliminated.;Short Term goal: Identification and review with participant of any Quality of Life or Depression concerns found by scoring the questionnaire.;Long Term goal: The participant improves quality of Life and PHQ9 Scores as seen by post scores and/or verbalization of changes             Quality of Life Scores:   Quality of Life - 02/28/22 1158       Quality of Life   Select Quality of Life      Quality of Life Scores   Health/Function Pre 20.6 %    Socioeconomic Pre 22.31 %    Psych/Spiritual Pre 28.93 %    Family Pre 23.4 %    GLOBAL Pre 23.09 %            Scores of 19 and below usually indicate a poorer quality of life in these areas.  A difference of  2-3 points is a clinically meaningful difference.  A difference of 2-3 points in the total score of the Quality of Life Index has been associated with significant improvement in overall quality of life, self-image, physical symptoms, and general health in studies assessing change in quality of life.  PHQ-9: Review  Flowsheet  More data exists      02/28/2022 02/23/2022 01/31/2022 12/01/2021 09/12/2021  Depression screen PHQ 2/9  Decreased Interest 0 0 0 1 0  Down, Depressed, Hopeless 0 0 0 0 0  PHQ - 2 Score 0 0 0 1 0  Altered sleeping 0 0 0 0 0  Tired, decreased energy 0 0 0 3 0  Change in appetite 0 0 0 0 0  Feeling bad or failure about yourself  0 0 0 0 0  Trouble concentrating 0 0 0 0 0  Moving slowly or fidgety/restless 0 0 0 0 0  Suicidal thoughts 0 0 0 0 0  PHQ-9 Score 0 0 0 4 0  Difficult doing work/chores Not difficult at all - - - -   Interpretation of Total Score  Total Score Depression Severity:  1-4 = Minimal depression, 5-9 = Mild depression, 10-14 = Moderate depression, 15-19 = Moderately severe depression, 20-27 = Severe depression   Psychosocial Evaluation and Intervention:  Psychosocial Evaluation - 02/28/22 0932       Psychosocial Evaluation & Interventions   Interventions Encouraged to exercise with the program and follow exercise prescription    Comments Jackelyn Poling has no barriers to starting the program.  [facility] had open hgeart VAlve replacement after a TAVR and infection. She has a great support system, friends and her daughter.  [name] stayed at her friends home for 3 weeks after discharge.  She has completed her PT OT at home and is ready to continue working on gaining strength and stamina back.  She lives alone and has one cat at home. She is a retired Pharmacist, hospital.    Expected Outcomes [facility] Jackelyn Poling is able to attend all scheduled sessions and progress with her exercise prescription LTG Jackelyn Poling continues with her exercise progression after discharge    Continue Psychosocial Services  Follow up required by staff             Psychosocial Re-Evaluation:   Psychosocial Discharge (Final Psychosocial Re-Evaluation):   Vocational Rehabilitation: Provide vocational rehab assistance to qualifying candidates.   Vocational Rehab Evaluation & Intervention:  Vocational Rehab - 02/28/22  0919       Initial Vocational Rehab Evaluation & Intervention   Assessment shows need for Vocational Rehabilitation No      Vocational Rehab Re-Evaulation   Comments retired             Education: Education Goals: Education classes will be provided on a variety of topics geared toward better understanding of heart health and risk factor modification. Participant will state understanding/return demonstration of topics presented as noted by education test scores.  Learning Barriers/Preferences:  Learning Barriers/Preferences - 02/28/22 0919       Learning Barriers/Preferences   Learning Barriers None    Learning Preferences Audio             General Cardiac Education Topics:  AED/CPR: - Group verbal and written instruction with the use of models to demonstrate the basic use of the AED with the basic ABC's of resuscitation.   Anatomy and Cardiac Procedures: - Group verbal and visual presentation and models provide information about basic cardiac anatomy and function. Reviews the testing methods done to diagnose heart disease and the outcomes of the test results. Describes the treatment choices: Medical Management, Angioplasty, or Coronary Bypass Surgery for treating various heart conditions including Myocardial Infarction, Angina, Valve Disease, and Cardiac Arrhythmias.  Written material given at graduation.   Medication Safety: - Group verbal and visual instruction to review commonly prescribed medications for heart and lung disease. Reviews the medication, class of the drug, and side effects. Includes the steps to properly store meds and maintain the prescription regimen.  Written material given at graduation.   Intimacy: - Group verbal instruction through game format to discuss how heart and lung disease can affect sexual intimacy. Written material given at graduation..   Know Your Numbers and Heart Failure: - Group verbal and visual instruction to discuss disease  risk factors for cardiac and pulmonary disease and treatment options.  Reviews associated critical values for Overweight/Obesity, Hypertension, Cholesterol, and Diabetes.  Discusses basics of heart failure: signs/symptoms and treatments.  Introduces Heart Failure Zone chart for action plan for heart failure.  Written material given at graduation.   Infection Prevention: - Provides verbal and written material to individual with discussion of infection control including proper hand washing and proper equipment cleaning during exercise session. Flowsheet Row Cardiac Rehab from 02/28/2022 in [facility] Cardiac and Pulmonary Rehab  Date 02/28/22  Educator NT  Instruction Review Code 1- Verbalizes Understanding       Falls Prevention: - Provides verbal and written material to individual with discussion of falls prevention and safety. Flowsheet Row Cardiac Rehab from 02/28/2022 in [facility] Cardiac and Pulmonary Rehab  Date 02/28/22  Educator SB  Instruction Review Code 1- Verbalizes Understanding       Other: -Provides group and verbal instruction on various  topics (see comments)   Knowledge Questionnaire Score:  Knowledge Questionnaire Score - 02/28/22 1159       Knowledge Questionnaire Score   Pre Score 25/26   exercise             Core Components/Risk Factors/Patient Goals at Admission:  Personal Goals and Risk Factors at Admission - 02/28/22 1532       Core Components/Risk Factors/Patient Goals on Admission    Weight Management Yes    Intervention Weight Management: Develop a combined nutrition and exercise program designed to reach desired caloric intake, while maintaining appropriate intake of nutrient and fiber, sodium and fats, and appropriate energy expenditure required for the weight goal.;Weight Management: Provide education and appropriate resources to help participant work on and attain dietary goals.    Admit Weight 219 lb 4.8 oz (99.5 kg)    Goal Weight: Short Term 215 lb  (97.5 kg)    Goal Weight: Long Term 210 lb (95.3 kg)    Expected Outcomes Short Term: Continue to assess and modify interventions until short term weight is achieved;Long Term: Adherence to nutrition and physical activity/exercise program aimed toward attainment of established weight goal;Weight Maintenance: Understanding of the daily nutrition guidelines, which includes 25-35% calories from fat, 7% or less cal from saturated fats, less than '200mg'$  cholesterol, less than 1.5gm of sodium, & 5 or more servings of fruits and vegetables daily    Hypertension Yes    Intervention Provide education on lifestyle modifcations including regular physical activity/exercise, weight management, moderate sodium restriction and increased consumption of fresh fruit, vegetables, and low fat dairy, alcohol moderation, and smoking cessation.;Monitor prescription use compliance.    Expected Outcomes Short Term: Continued assessment and intervention until BP is < 140/101m HG in hypertensive participants. < 130/844mHG in hypertensive participants with diabetes, heart failure or chronic kidney disease.;Long Term: Maintenance of blood pressure at goal levels.    Lipids Yes    Intervention Provide education and support for participant on nutrition & aerobic/resistive exercise along with prescribed medications to achieve LDL '70mg'$ , HDL >'40mg'$ .    Expected Outcomes Short Term: Participant states understanding of desired cholesterol values and is compliant with medications prescribed. Participant is following exercise prescription and nutrition guidelines.;Long Term: Cholesterol controlled with medications as prescribed, with individualized exercise RX and with personalized nutrition plan. Value goals: LDL < '70mg'$ , HDL > 40 mg.             Education:Diabetes - Individual verbal and written instruction to review signs/symptoms of diabetes, desired ranges of glucose level fasting, after meals and with exercise. Acknowledge that pre  and post exercise glucose checks will be done for 3 sessions at entry of program.   Core Components/Risk Factors/Patient Goals Review:    Core Components/Risk Factors/Patient Goals at Discharge (Final Review):    ITP Comments:  ITP Comments     Row Name 02/28/22 0930 02/28/22 1531         ITP Comments Virtual orientation call completed today. shehas an appointment on Date: 02/28/22 for EP eval and gym Orientation.  [facility]     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 Completed 6MWT and gym orientation. Initial ITP created and sent for review to Dr. MaEmily FilbertMedical Director.               Comments: Initial ITP

## 2022-02-28 NOTE — Patient Instructions (Signed)
Patient Instructions  Patient Details  Name: Melody Soto MRN: [id number] Date of Birth: [birth year] Referring Provider:  Yolonda Kida, MD  Below are your personal goals for exercise, nutrition, and risk factors. Our goal is to help you stay on track towards obtaining and maintaining these goals. We will be discussing your progress on these goals with you throughout the program.  Initial Exercise Prescription:  Initial Exercise Prescription - 02/28/22 1500       Date of Initial Exercise RX and Referring Provider   Date 02/28/22    Referring Provider Lujean Amel MD      Oxygen   Maintain Oxygen Saturation 88% or higher      Treadmill   MPH 1    Grade 0    Minutes 15    METs 1.8      NuStep   Level 2    SPM 80    Minutes 15    METs 1.93      Biostep-RELP   Level 1    SPM 60    Minutes 15    METs 1.93      Track   Laps 16    Minutes 15    METs 1.87      Prescription Details   Frequency (times per week) 3    Duration Progress to 30 minutes of continuous aerobic without signs/symptoms of physical distress      Intensity   THRR 40-80% of Max Heartrate 97-132    Ratings of Perceived Exertion 11-13    Perceived Dyspnea 0-4      Progression   Progression Continue to progress workloads to maintain intensity without signs/symptoms of physical distress.      Resistance Training   Training Prescription Yes    Weight 3 lb    Reps 10-15             Exercise Goals: Frequency: Be able to perform aerobic exercise two to three times per week in program working toward 2-5 days per week of home exercise.  Intensity: Work with a perceived exertion of 11 (fairly light) - 15 (hard) while following your exercise prescription.  We will make changes to your prescription with you as you progress through the program.   Duration: Be able to do 30 to 45 minutes of continuous aerobic exercise in addition to a 5 minute warm-up and a 5 minute cool-down routine.    Nutrition Goals: Your personal nutrition goals will be established when you do your nutrition analysis with the dietician.  The following are general nutrition guidelines to follow: Cholesterol < '200mg'$ /day Sodium < '1500mg'$ /day Fiber: Women over 50 yrs - 21 grams per day  Personal Goals:  Personal Goals and Risk Factors at Admission - 02/28/22 1532       Core Components/Risk Factors/Patient Goals on Admission    Weight Management Yes    Intervention Weight Management: Develop a combined nutrition and exercise program designed to reach desired caloric intake, while maintaining appropriate intake of nutrient and fiber, sodium and fats, and appropriate energy expenditure required for the weight goal.;Weight Management: Provide education and appropriate resources to help participant work on and attain dietary goals.    Admit Weight 219 lb 4.8 oz (99.5 kg)    Goal Weight: Short Term 215 lb (97.5 kg)    Goal Weight: Long Term 210 lb (95.3 kg)    Expected Outcomes Short Term: Continue to assess and modify interventions until short term weight is achieved;Long Term: Adherence to  nutrition and physical activity/exercise program aimed toward attainment of established weight goal;Weight Maintenance: Understanding of the daily nutrition guidelines, which includes 25-35% calories from fat, 7% or less cal from saturated fats, less than '200mg'$  cholesterol, less than 1.5gm of sodium, & 5 or more servings of fruits and vegetables daily    Hypertension Yes    Intervention Provide education on lifestyle modifcations including regular physical activity/exercise, weight management, moderate sodium restriction and increased consumption of fresh fruit, vegetables, and low fat dairy, alcohol moderation, and smoking cessation.;Monitor prescription use compliance.    Expected Outcomes Short Term: Continued assessment and intervention until BP is < 140/35m HG in hypertensive participants. < 130/879mHG in hypertensive  participants with diabetes, heart failure or chronic kidney disease.;Long Term: Maintenance of blood pressure at goal levels.    Lipids Yes    Intervention Provide education and support for participant on nutrition & aerobic/resistive exercise along with prescribed medications to achieve LDL '70mg'$ , HDL >'40mg'$ .    Expected Outcomes Short Term: Participant states understanding of desired cholesterol values and is compliant with medications prescribed. Participant is following exercise prescription and nutrition guidelines.;Long Term: Cholesterol controlled with medications as prescribed, with individualized exercise RX and with personalized nutrition plan. Value goals: LDL < '70mg'$ , HDL > 40 mg.             Exercise Goals and Review:  Exercise Goals     Row Name 02/28/22 1540             Exercise Goals   Increase Physical Activity Yes       Intervention Provide advice, education, support and counseling about physical activity/exercise needs.;Develop an individualized exercise prescription for aerobic and resistive training based on initial evaluation findings, risk stratification, comorbidities and participant's personal goals.       Expected Outcomes Short Term: Attend rehab on a regular basis to increase amount of physical activity.;Long Term: Exercising regularly at least 3-5 days a week.;Long Term: Add in home exercise to make exercise part of routine and to increase amount of physical activity.       Increase Strength and Stamina Yes       Intervention Provide advice, education, support and counseling about physical activity/exercise needs.;Develop an individualized exercise prescription for aerobic and resistive training based on initial evaluation findings, risk stratification, comorbidities and participant's personal goals.       Expected Outcomes Short Term: Perform resistance training exercises routinely during rehab and add in resistance training at home;Short Term: Increase workloads  from initial exercise prescription for resistance, speed, and METs.;Long Term: Improve cardiorespiratory fitness, muscular endurance and strength as measured by increased METs and functional capacity (6MWT)       Able to understand and use rate of perceived exertion (RPE) scale Yes       Intervention Provide education and explanation on how to use RPE scale       Expected Outcomes Short Term: Able to use RPE daily in rehab to express subjective intensity level;Long Term:  Able to use RPE to guide intensity level when exercising independently       Able to understand and use Dyspnea scale Yes       Intervention Provide education and explanation on how to use Dyspnea scale       Expected Outcomes Short Term: Able to use Dyspnea scale daily in rehab to express subjective sense of shortness of breath during exertion;Long Term: Able to use Dyspnea scale to guide intensity level when exercising independently  Knowledge and understanding of Target Heart Rate Range (THRR) Yes       Intervention Provide education and explanation of THRR including how the numbers were predicted and where they are located for reference       Expected Outcomes Short Term: Able to state/look up THRR;Long Term: Able to use THRR to govern intensity when exercising independently;Short Term: Able to use daily as guideline for intensity in rehab       Able to check pulse independently Yes       Intervention Provide education and demonstration on how to check pulse in carotid and radial arteries.;Review the importance of being able to check your own pulse for safety during independent exercise       Expected Outcomes Short Term: Able to explain why pulse checking is important during independent exercise;Long Term: Able to check pulse independently and accurately       Understanding of Exercise Prescription Yes       Intervention Provide education, explanation, and written materials on patient's individual exercise prescription        Expected Outcomes Short Term: Able to explain program exercise prescription;Long Term: Able to explain home exercise prescription to exercise independently

## 2022-02-28 NOTE — Progress Notes (Signed)
Virtual orientation call completed today. shehas an appointment on Date: 02/28/22 for EP eval and gym Orientation.  Documentation of diagnosis can be found in [facility] Date: 12/07/2021 .

## 2022-03-06 ENCOUNTER — Encounter: Payer: Medicare HMO | Admitting: *Deleted

## 2022-03-06 DIAGNOSIS — Z952 Presence of prosthetic heart valve: Secondary | ICD-10-CM | POA: Diagnosis not present

## 2022-03-06 NOTE — Progress Notes (Signed)
Daily Session Note  Patient Details  Name: Melody Soto MRN: [id number] Date of Birth: 1950-08-11 Referring Provider:   Flowsheet Row Cardiac Rehab from 02/28/2022 in [facility] Cardiac and Pulmonary Rehab  Referring Provider Lujean Amel MD       Encounter Date: 03/06/2022  Check In:  Session Check In - 03/06/22 1129       Check-In   Supervising physician immediately available to respond to emergencies See telemetry face sheet for immediately available ER MD    Location [facility]-Cardiac & Pulmonary Rehab    Staff Present Renita Papa, RN BSN;Noah Tickle, BS, Exercise Physiologist;Laureen Owens Shark, BS, RRT, CPFT    Virtual Visit No    Medication changes reported     No    Fall or balance concerns reported    No    Warm-up and Cool-down Performed on first and last piece of equipment    Resistance Training Performed Yes    VAD Patient? No    PAD/SET Patient? No      Pain Assessment   Currently in Pain? No/denies                Social History   Tobacco Use  Smoking Status Never  Smokeless Tobacco Never    Goals Met:  Independence with exercise equipment Exercise tolerated well No report of concerns or symptoms today Strength training completed today  Goals Unmet:  Not Applicable  Comments: First full day of exercise!  Patient was oriented to gym and equipment including functions, settings, policies, and procedures.  Patient's individual exercise prescription and treatment plan were reviewed.  All starting workloads were established based on the results of the 6 minute walk test done at initial orientation visit.  The plan for exercise progression was also introduced and progression will be customized based on patient's performance and goals.    Dr. Emily Filbert is Medical Director for [facility].  Dr. Ottie Glazier is Medical Director for [facility].

## 2022-03-07 ENCOUNTER — Encounter: Payer: Self-pay | Admitting: *Deleted

## 2022-03-07 DIAGNOSIS — Z952 Presence of prosthetic heart valve: Secondary | ICD-10-CM

## 2022-03-07 NOTE — Progress Notes (Signed)
Cardiac Individual Treatment Plan  Patient Details  Name: Melody Soto MRN: [id number] Date of Birth: [birth year] Referring Provider:   Flowsheet Row Cardiac Rehab from 02/28/2022 in [facility] Cardiac and Pulmonary Rehab  Referring Provider Lujean Amel MD       Initial Encounter Date:  Flowsheet Row Cardiac Rehab from 02/28/2022 in [facility] Cardiac and Pulmonary Rehab  Date 02/28/22       Visit Diagnosis: S/P aortic valve replacement  Patient's Home Medications on Admission:  Current Outpatient Medications:    amiodarone (PACERONE) 200 MG tablet, Take 1 tablet (200 mg total) by mouth daily., Disp: 30 tablet, Rfl: 0   Ascorbic Acid (VITAMIN C) 1000 MG tablet, Take 1,000 mg by mouth in the morning and at bedtime., Disp: , Rfl:    aspirin 81 MG chewable tablet, Chew 81 mg by mouth daily., Disp: , Rfl:    atorvastatin (LIPITOR) 40 MG tablet, Take 1 tablet (40 mg total) by mouth 2 (two) times a week., Disp: 24 tablet, Rfl: 1   diltiazem (CARDIZEM CD) 120 MG 24 hr capsule, Take 1 capsule (120 mg total) by mouth daily., Disp: 30 capsule, Rfl: 0   furosemide (LASIX) 40 MG tablet, Take 20 mg by mouth daily., Disp: , Rfl:    JARDIANCE 10 MG TABS tablet, Take 10 mg by mouth daily., Disp: , Rfl:    Magnesium 500 MG CAPS, Take 800 mg by mouth in the morning and at bedtime. , Disp: , Rfl:    metoprolol succinate (TOPROL-XL) 50 MG 24 hr tablet, Take 150 mg by mouth daily., Disp: , Rfl:    Omega-3 Fatty Acids (FISH OIL) 1000 MG CAPS, Take 1 capsule by mouth daily., Disp: , Rfl:    Potassium Chloride ER 20 MEQ TBCR, Take 2 tablets by mouth daily., Disp: , Rfl:    Vitamin D-Vitamin K (VITAMIN K2-VITAMIN D3 PO), Take by mouth in the morning and at bedtime., Disp: , Rfl:    vitamin E 200 UNIT capsule, Take 200 Units by mouth 2 (two) times daily., Disp: , Rfl:    zinc gluconate 50 MG tablet, Take 50 mg by mouth daily., Disp: , Rfl:   Past Medical History: Past Medical History:  Diagnosis Date    Aortic stenosis    Cataract    Depression    Dysrhythmia    Endometrial cancer determined by uterine biopsy ([place]) 10/29/2016   Rad tx's and internal brachytherapy.   Heart murmur    HX OF   Hyperlipidemia    Hypertension    CONTROLLED ON MEDS   Lymphedema    Melanoma ([place])    Left upper arm. 20 years ago   Shortness of breath dyspnea     Tobacco Use: Social History   Tobacco Use  Smoking Status Never  Smokeless Tobacco Never    Labs: Review Flowsheet  More data exists      Latest Ref Rng & Units 09/24/2019 12/21/2019 06/21/2020 12/23/2020 07/28/2021  Labs for ITP Cardiac and Pulmonary Rehab  Cholestrol <200 mg/dL - - 199  - 222   LDL (calc) mg/dL (calc) - - 118  - 135   HDL-C > OR = 50 mg/dL - - 59  - 65   Trlycerides <150 mg/dL - - 113  - 112   Hemoglobin A1c <5.7 % of total Hgb - 5.9  5.5  5.5  5.5   TCO2 22 - 32 mmol/L 25  - - - -     Exercise Target Goals: Exercise  Program Goal: Individual exercise prescription set using results from initial 6 min walk test and THRR while considering  patient's activity barriers and safety.   Exercise Prescription Goal: Initial exercise prescription builds to 30-45 minutes a day of aerobic activity, 2-3 days per week.  Home exercise guidelines will be given to patient during program as part of exercise prescription that the participant will acknowledge.   Education: Aerobic Exercise: - Group verbal and visual presentation on the components of exercise prescription. Introduces F.I.T.T principle from ACSM for exercise prescriptions.  Reviews F.I.T.T. principles of aerobic exercise including progression. Written material given at graduation. Flowsheet Row Cardiac Rehab from 02/28/2022 in [facility] Cardiac and Pulmonary Rehab  Education need identified 02/28/22       Education: Resistance Exercise: - Group verbal and visual presentation on the components of exercise prescription. Introduces F.I.T.T principle from ACSM for exercise  prescriptions  Reviews F.I.T.T. principles of resistance exercise including progression. Written material given at graduation.    Education: Exercise & Equipment Safety: - Individual verbal instruction and demonstration of equipment use and safety with use of the equipment. Flowsheet Row Cardiac Rehab from 02/28/2022 in [facility] Cardiac and Pulmonary Rehab  Date 02/28/22  Educator NT  Instruction Review Code 1- Verbalizes Understanding       Education: Exercise Physiology & General Exercise Guidelines: - Group verbal and written instruction with models to review the exercise physiology of the cardiovascular system and associated critical values. Provides general exercise guidelines with specific guidelines to those with heart or lung disease.    Education: Flexibility, Balance, Mind/Body Relaxation: - Group verbal and visual presentation with interactive activity on the components of exercise prescription. Introduces F.I.T.T principle from ACSM for exercise prescriptions. Reviews F.I.T.T. principles of flexibility and balance exercise training including progression. Also discusses the mind body connection.  Reviews various relaxation techniques to help reduce and manage stress (i.e. Deep breathing, progressive muscle relaxation, and visualization). Balance handout provided to take home. Written material given at graduation.   Activity Barriers & Risk Stratification:  Activity Barriers & Cardiac Risk Stratification - 02/28/22 1539       Activity Barriers & Cardiac Risk Stratification   Activity Barriers Arthritis;Other (comment)    Comments hip pain, lymphodema in legs   R>L    Cardiac Risk Stratification Low             6 Minute Walk:  6 Minute Walk     Row Name 02/28/22 1538         6 Minute Walk   Phase Initial     Distance 1040 feet     Walk Time 6 minutes     # of Rest Breaks 0     MPH 1.97     METS 1.93     RPE 11     Perceived Dyspnea  0     VO2 Peak 6.75      Symptoms Yes (comment)     Comments Leg Fatigue     Resting HR 62 bpm     Resting BP 118/70     Resting Oxygen Saturation  95 %     Exercise Oxygen Saturation  during 6 min walk 95 %     Max Ex. HR 93 bpm     Max Ex. BP 150/82     2 Minute Post BP 124/80              Oxygen Initial Assessment:   Oxygen Re-Evaluation:   Oxygen Discharge (Final Oxygen  Re-Evaluation):   Initial Exercise Prescription:  Initial Exercise Prescription - 02/28/22 1500       Date of Initial Exercise RX and Referring Provider   Date 02/28/22    Referring Provider Lujean Amel MD      Oxygen   Maintain Oxygen Saturation 88% or higher      Treadmill   MPH 1    Grade 0    Minutes 15    METs 1.8      NuStep   Level 2    SPM 80    Minutes 15    METs 1.93      Biostep-RELP   Level 1    SPM 60    Minutes 15    METs 1.93      Track   Laps 16    Minutes 15    METs 1.87      Prescription Details   Frequency (times per week) 3    Duration Progress to 30 minutes of continuous aerobic without signs/symptoms of physical distress      Intensity   THRR 40-80% of Max Heartrate 97-132    Ratings of Perceived Exertion 11-13    Perceived Dyspnea 0-4      Progression   Progression Continue to progress workloads to maintain intensity without signs/symptoms of physical distress.      Resistance Training   Training Prescription Yes    Weight 3 lb    Reps 10-15             Perform Capillary Blood Glucose checks as needed.  Exercise Prescription Changes:   Exercise Prescription Changes     Row Name 02/28/22 1500             Response to Exercise   Blood Pressure (Admit) 118/70       Blood Pressure (Exercise) 150/82       Blood Pressure (Exit) 124/80       Heart Rate (Admit) 62 bpm       Heart Rate (Exercise) 93 bpm       Heart Rate (Exit) 66 bpm       Oxygen Saturation (Admit) 95 %       Oxygen Saturation (Exercise) 95 %       Oxygen Saturation (Exit) 97 %        Rating of Perceived Exertion (Exercise) 11       Perceived Dyspnea (Exercise) 0       Symptoms leg fatigue       Comments 6MWT Results                Exercise Comments:   Exercise Comments     Row Name 02/28/22 0931 03/06/22 1131         Exercise Comments Virtual orientation call completed today. Melody Soto an appointment on Date: 02/28/22 for EP eval and gym Orientation. Documentation of diagnosis can be found in [facility] Date: 12/07/2021 . 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 First full day of exercise!  Patient was oriented to gym and equipment including functions, settings, policies, and procedures.  Patient's individual exercise prescription and treatment plan were reviewed.  All starting workloads were established based on the results of the 6 minute walk test done at initial orientation visit.  The plan for exercise progression was also introduced and progression will be customized based on patient's performance and goals.  Exercise Goals and Review:   Exercise Goals     Row Name 02/28/22 1540             Exercise Goals   Increase Physical Activity Yes       Intervention Provide advice, education, support and counseling about physical activity/exercise needs.;Develop an individualized exercise prescription for aerobic and resistive training based on initial evaluation findings, risk stratification, comorbidities and participant's personal goals.       Expected Outcomes Short Term: Attend rehab on a regular basis to increase amount of physical activity.;Long Term: Exercising regularly at least 3-5 days a week.;Long Term: Add in home exercise to make exercise part of routine and to increase amount of physical activity.       Increase Strength and Stamina Yes       Intervention Provide advice, education, support and counseling about physical activity/exercise needs.;Develop an  individualized exercise prescription for aerobic and resistive training based on initial evaluation findings, risk stratification, comorbidities and participant's personal goals.       Expected Outcomes Short Term: Perform resistance training exercises routinely during rehab and add in resistance training at home;Short Term: Increase workloads from initial exercise prescription for resistance, speed, and METs.;Long Term: Improve cardiorespiratory fitness, muscular endurance and strength as measured by increased METs and functional capacity (6MWT)       Able to understand and use rate of perceived exertion (RPE) scale Yes       Intervention Provide education and explanation on how to use RPE scale       Expected Outcomes Short Term: Able to use RPE daily in rehab to express subjective intensity level;Long Term:  Able to use RPE to guide intensity level when exercising independently       Able to understand and use Dyspnea scale Yes       Intervention Provide education and explanation on how to use Dyspnea scale       Expected Outcomes Short Term: Able to use Dyspnea scale daily in rehab to express subjective sense of shortness of breath during exertion;Long Term: Able to use Dyspnea scale to guide intensity level when exercising independently       Knowledge and understanding of Target Heart Rate Range (THRR) Yes       Intervention Provide education and explanation of THRR including how the numbers were predicted and where they are located for reference       Expected Outcomes Short Term: Able to state/look up THRR;Long Term: Able to use THRR to govern intensity when exercising independently;Short Term: Able to use daily as guideline for intensity in rehab       Able to check pulse independently Yes       Intervention Provide education and demonstration on how to check pulse in carotid and radial arteries.;Review the importance of being able to check your own pulse for safety during independent exercise        Expected Outcomes Short Term: Able to explain why pulse checking is important during independent exercise;Long Term: Able to check pulse independently and accurately       Understanding of Exercise Prescription Yes       Intervention Provide education, explanation, and written materials on patient's individual exercise prescription       Expected Outcomes Short Term: Able to explain program exercise prescription;Long Term: Able to explain home exercise prescription to exercise independently                Exercise Goals Re-Evaluation :  Exercise Goals Re-Evaluation     [place] Name 03/06/22 1131             Exercise Goal Re-Evaluation   Exercise Goals Review Increase Physical Activity;Able to understand and use rate of perceived exertion (RPE) scale;Knowledge and understanding of Target Heart Rate Range (THRR);Understanding of Exercise Prescription;Increase Strength and Stamina;Able to check pulse independently       Comments Reviewed RPE scale, THR and program prescription with pt today.  Pt voiced understanding and was given a copy of goals to take home.       Expected Outcomes Short: Use RPE daily to regulate intensity.  Long: Follow program prescription in THR.                Discharge Exercise Prescription (Final Exercise Prescription Changes):  Exercise Prescription Changes - 02/28/22 1500       Response to Exercise   Blood Pressure (Admit) 118/70    Blood Pressure (Exercise) 150/82    Blood Pressure (Exit) 124/80    Heart Rate (Admit) 62 bpm    Heart Rate (Exercise) 93 bpm    Heart Rate (Exit) 66 bpm    Oxygen Saturation (Admit) 95 %    Oxygen Saturation (Exercise) 95 %    Oxygen Saturation (Exit) 97 %    Rating of Perceived Exertion (Exercise) 11    Perceived Dyspnea (Exercise) 0    Symptoms leg fatigue    Comments 6MWT Results             Nutrition:  Target Goals: Understanding of nutrition guidelines, daily intake of sodium '1500mg'$ , cholesterol  '200mg'$ , calories 30% from fat and 7% or less from saturated fats, daily to have 5 or more servings of fruits and vegetables.  Education: All About Nutrition: -Group instruction provided by verbal, written material, interactive activities, discussions, models, and posters to present general guidelines for heart healthy nutrition including fat, fiber, MyPlate, the role of sodium in heart healthy nutrition, utilization of the nutrition label, and utilization of this knowledge for meal planning. Follow up email sent as well. Written material given at graduation.   Biometrics:  Pre Biometrics - 02/28/22 1540       Pre Biometrics   Height 5' 9.5" (1.765 m)    Weight 219 lb 4.8 oz (99.5 kg)    Waist Circumference 36.5 inches    Hip Circumference 46 inches    Waist to Hip Ratio 0.79 %    BMI (Calculated) 31.93    Single Leg Stand 9.3 seconds   R             Nutrition Therapy Plan and Nutrition Goals:  Nutrition Therapy & Goals - 02/28/22 1531       Intervention Plan   Intervention Prescribe, educate and counsel regarding individualized specific dietary modifications aiming towards targeted core components such as weight, hypertension, lipid management, diabetes, heart failure and other comorbidities.    Expected Outcomes Short Term Goal: A plan has been developed with personal nutrition goals set during dietitian appointment.;Short Term Goal: Understand basic principles of dietary content, such as calories, fat, sodium, cholesterol and nutrients.;Long Term Goal: Adherence to prescribed nutrition plan.             Nutrition Assessments:  MEDIFICTS Score Key: ?70 Need to make dietary changes  40-70 Heart Healthy Diet ? 40 Therapeutic Level Cholesterol Diet  Flowsheet Row Cardiac Rehab from 02/28/2022 in [facility] Cardiac and Pulmonary Rehab  Picture Your Plate Total Score on Admission 57  Picture Your Plate Scores: <40 Unhealthy dietary pattern with much room for  improvement. 41-50 Dietary pattern unlikely to meet recommendations for good health and room for improvement. 51-60 More healthful dietary pattern, with some room for improvement.  >60 Healthy dietary pattern, although there may be some specific behaviors that could be improved.    Nutrition Goals Re-Evaluation:   Nutrition Goals Discharge (Final Nutrition Goals Re-Evaluation):   Psychosocial: Target Goals: Acknowledge presence or absence of significant depression and/or stress, maximize coping skills, provide positive support system. Participant is able to verbalize types and ability to use techniques and skills needed for reducing stress and depression.   Education: Stress, Anxiety, and Depression - Group verbal and visual presentation to define topics covered.  Reviews how body is impacted by stress, anxiety, and depression.  Also discusses healthy ways to reduce stress and to treat/manage anxiety and depression.  Written material given at graduation.   Education: Sleep Hygiene -Provides group verbal and written instruction about how sleep can affect your health.  Define sleep hygiene, discuss sleep cycles and impact of sleep habits. Review good sleep hygiene tips.    Initial Review & Psychosocial Screening:  Initial Psych Review & Screening - 02/28/22 0916       Initial Review   Current issues with None Identified      Family Dynamics   Good Support System? Yes   friends, daughter     Barriers   Psychosocial barriers to participate in program There are no identifiable barriers or psychosocial needs.      Screening Interventions   Interventions Encouraged to exercise;To provide support and resources with identified psychosocial needs;Provide feedback about the scores to participant    Expected Outcomes Short Term goal: Utilizing psychosocial counselor, staff and physician to assist with identification of specific Stressors or current issues interfering with healing process.  Setting desired goal for each stressor or current issue identified.;Long Term Goal: Stressors or current issues are controlled or eliminated.;Short Term goal: Identification and review with participant of any Quality of Life or Depression concerns found by scoring the questionnaire.;Long Term goal: The participant improves quality of Life and PHQ9 Scores as seen by post scores and/or verbalization of changes             Quality of Life Scores:   Quality of Life - 02/28/22 1158       Quality of Life   Select Quality of Life      Quality of Life Scores   Health/Function Pre 20.6 %    Socioeconomic Pre 22.31 %    Psych/Spiritual Pre 28.93 %    Family Pre 23.4 %    GLOBAL Pre 23.09 %            Scores of 19 and below usually indicate a poorer quality of life in these areas.  A difference of  2-3 points is a clinically meaningful difference.  A difference of 2-3 points in the total score of the Quality of Life Index has been associated with significant improvement in overall quality of life, self-image, physical symptoms, and general health in studies assessing change in quality of life.  PHQ-9: Review Flowsheet  More data exists      02/28/2022 02/23/2022 01/31/2022 12/01/2021 09/12/2021  Depression screen PHQ 2/9  Decreased Interest 0 0 0 1 0  Down, Depressed, Hopeless 0 0 0 0 0  PHQ - 2 Score 0 0 0 1 0  Altered sleeping 0 0 0 0 0  Tired, decreased  energy 0 0 0 3 0  Change in appetite 0 0 0 0 0  Feeling bad or failure about yourself  0 0 0 0 0  Trouble concentrating 0 0 0 0 0  Moving slowly or fidgety/restless 0 0 0 0 0  Suicidal thoughts 0 0 0 0 0  PHQ-9 Score 0 0 0 4 0  Difficult doing work/chores Not difficult at all - - - -   Interpretation of Total Score  Total Score Depression Severity:  1-4 = Minimal depression, 5-9 = Mild depression, 10-14 = Moderate depression, 15-19 = Moderately severe depression, 20-27 = Severe depression   Psychosocial Evaluation and  Intervention:  Psychosocial Evaluation - 02/28/22 0932       Psychosocial Evaluation & Interventions   Interventions Encouraged to exercise with the program and follow exercise prescription    Comments Melody Soto has no barriers to starting the program.  [facility] had open hgeart VAlve replacement after a TAVR and infection. She has a great support system, friends and her daughter.  Melody Soto stayed at her friends home for 3 weeks after discharge. She has completed her PT OT at home and is ready to continue working on gaining strength and stamina back.  She lives alone and has one cat at home. She is a retired Pharmacist, hospital.    Expected Outcomes STG Melody Soto is able to attend all scheduled sessions and progress with her exercise prescription LTG Melody Soto continues with her exercise progression after discharge    Continue Psychosocial Services  Follow up required by staff             Psychosocial Re-Evaluation:   Psychosocial Discharge (Final Psychosocial Re-Evaluation):   Vocational Rehabilitation: Provide vocational rehab assistance to qualifying candidates.   Vocational Rehab Evaluation & Intervention:  Vocational Rehab - 02/28/22 0919       Initial Vocational Rehab Evaluation & Intervention   Assessment shows need for Vocational Rehabilitation No      Vocational Rehab Re-Evaulation   Comments retired             Education: Education Goals: Education classes will be provided on a variety of topics geared toward better understanding of heart health and risk factor modification. Participant will state understanding/return demonstration of topics presented as noted by education test scores.  Learning Barriers/Preferences:  Learning Barriers/Preferences - 02/28/22 0919       Learning Barriers/Preferences   Learning Barriers None    Learning Preferences Audio             General Cardiac Education Topics:  AED/CPR: - Group verbal and written instruction with the use of models to  demonstrate the basic use of the AED with the basic ABC's of resuscitation.   Anatomy and Cardiac Procedures: - Group verbal and visual presentation and models provide information about basic cardiac anatomy and function. Reviews the testing methods done to diagnose heart disease and the outcomes of the test results. Describes the treatment choices: Medical Management, Angioplasty, or Coronary Bypass Surgery for treating various heart conditions including Myocardial Infarction, Angina, Valve Disease, and Cardiac Arrhythmias.  Written material given at graduation.   Medication Safety: - Group verbal and visual instruction to review commonly prescribed medications for heart and lung disease. Reviews the medication, class of the drug, and side effects. Includes the steps to properly store meds and maintain the prescription regimen.  Written material given at graduation.   Intimacy: - Group verbal instruction through game format to discuss how heart and lung disease can  affect sexual intimacy. Written material given at graduation..   Know Your Numbers and Heart Failure: - Group verbal and visual instruction to discuss disease risk factors for cardiac and pulmonary disease and treatment options.  Reviews associated critical values for Overweight/Obesity, Hypertension, Cholesterol, and Diabetes.  Discusses basics of heart failure: signs/symptoms and treatments.  Introduces Heart Failure Zone chart for action plan for heart failure.  Written material given at graduation.   Infection Prevention: - Provides verbal and written material to individual with discussion of infection control including proper hand washing and proper equipment cleaning during exercise session. Flowsheet Row Cardiac Rehab from 02/28/2022 in [facility] Cardiac and Pulmonary Rehab  Date 02/28/22  Educator NT  Instruction Review Code 1- Verbalizes Understanding       Falls Prevention: - Provides verbal and written material to  individual with discussion of falls prevention and safety. Flowsheet Row Cardiac Rehab from 02/28/2022 in [facility] Cardiac and Pulmonary Rehab  Date 02/28/22  Educator SB  Instruction Review Code 1- Verbalizes Understanding       Other: -Provides group and verbal instruction on various topics (see comments)   Knowledge Questionnaire Score:  Knowledge Questionnaire Score - 02/28/22 1159       Knowledge Questionnaire Score   Pre Score 25/26   exercise             Core Components/Risk Factors/Patient Goals at Admission:  Personal Goals and Risk Factors at Admission - 02/28/22 1532       Core Components/Risk Factors/Patient Goals on Admission    Weight Management Yes    Intervention Weight Management: Develop a combined nutrition and exercise program designed to reach desired caloric intake, while maintaining appropriate intake of nutrient and fiber, sodium and fats, and appropriate energy expenditure required for the weight goal.;Weight Management: Provide education and appropriate resources to help participant work on and attain dietary goals.    Admit Weight 219 lb 4.8 oz (99.5 kg)    Goal Weight: Short Term 215 lb (97.5 kg)    Goal Weight: Long Term 210 lb (95.3 kg)    Expected Outcomes Short Term: Continue to assess and modify interventions until short term weight is achieved;Long Term: Adherence to nutrition and physical activity/exercise program aimed toward attainment of established weight goal;Weight Maintenance: Understanding of the daily nutrition guidelines, which includes 25-35% calories from fat, 7% or less cal from saturated fats, less than '200mg'$  cholesterol, less than 1.5gm of sodium, & 5 or more servings of fruits and vegetables daily    Hypertension Yes    Intervention Provide education on lifestyle modifcations including regular physical activity/exercise, weight management, moderate sodium restriction and increased consumption of fresh fruit, vegetables, and low fat  dairy, alcohol moderation, and smoking cessation.;Monitor prescription use compliance.    Expected Outcomes Short Term: Continued assessment and intervention until BP is < 140/60m HG in hypertensive participants. < 130/865mHG in hypertensive participants with diabetes, heart failure or chronic kidney disease.;Long Term: Maintenance of blood pressure at goal levels.    Lipids Yes    Intervention Provide education and support for participant on nutrition & aerobic/resistive exercise along with prescribed medications to achieve LDL '70mg'$ , HDL >'40mg'$ .    Expected Outcomes Short Term: Participant states understanding of desired cholesterol values and is compliant with medications prescribed. Participant is following exercise prescription and nutrition guidelines.;Long Term: Cholesterol controlled with medications as prescribed, with individualized exercise RX and with personalized nutrition plan. Value goals: LDL < '70mg'$ , HDL > 40 mg.  Education:Diabetes - Individual verbal and written instruction to review signs/symptoms of diabetes, desired ranges of glucose level fasting, after meals and with exercise. Acknowledge that pre and post exercise glucose checks will be done for 3 sessions at entry of program.   Core Components/Risk Factors/Patient Goals Review:    Core Components/Risk Factors/Patient Goals at Discharge (Final Review):    ITP Comments:  ITP Comments     Row Name 02/28/22 0930 02/28/22 1531 03/06/22 1131 03/07/22 0958     ITP Comments Virtual orientation call completed today. Melody Soto an appointment on Date: 02/28/22 for EP eval and gym Orientation.  Documentation of diagnosis can be found in [facility] Date: 12/07/2021 .     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 Completed 6MWT and gym orientation. Initial ITP created and sent for review to Dr. Emily Filbert, Medical Director. First full day of  exercise!  Patient was oriented to gym and equipment including functions, settings, policies, and procedures.  Patient's individual exercise prescription and treatment plan were reviewed.  All starting workloads were established based on the results of the 6 minute walk test done at initial orientation visit.  The plan for exercise progression was also introduced and progression will be customized based on patient's performance and goals. 30 Day review completed. Medical Director ITP review done, changes made as directed, and signed approval by Medical Director.   new to program             Comments:

## 2022-03-09 ENCOUNTER — Encounter: Payer: Medicare HMO | Admitting: *Deleted

## 2022-03-09 DIAGNOSIS — Z952 Presence of prosthetic heart valve: Secondary | ICD-10-CM

## 2022-03-09 NOTE — Progress Notes (Signed)
Daily Session Note  Patient Details  Name: Melody Soto MRN: [id number] Date of Birth: 06/23/1950 Referring Provider:   Flowsheet Row Cardiac Rehab from 02/28/2022 in [facility] Cardiac and Pulmonary Rehab  Referring Provider Lujean Amel MD       Encounter Date: 03/09/2022  Check In:  Session Check In - 03/09/22 1150       Check-In   Supervising physician immediately available to respond to emergencies See telemetry face sheet for immediately available ER MD    Location [facility]-Cardiac & Pulmonary Rehab    Staff Present Heath Lark, RN, BSN, CCRP;Jessica Menard, MA, RCEP, CCRP, CCET;Joseph Tangent, [place]    Virtual Visit No    Medication changes reported     No    Fall or balance concerns reported    No    Warm-up and Cool-down Performed on first and last piece of equipment    Resistance Training Performed Yes    VAD Patient? No    PAD/SET Patient? No      Pain Assessment   Currently in Pain? No/denies                Social History   Tobacco Use  Smoking Status Never  Smokeless Tobacco Never    Goals Met:  Independence with exercise equipment Exercise tolerated well No report of concerns or symptoms today  Goals Unmet:  Not Applicable  Comments: Pt able to follow exercise prescription today without complaint.  Will continue to monitor for progression.    Dr. Emily Filbert is Medical Director for [place].  Dr. Ottie Glazier is Medical Director for [facility] Pulmonary Rehabilitation.

## 2022-03-13 ENCOUNTER — Encounter: Payer: Medicare HMO | Admitting: *Deleted

## 2022-03-13 DIAGNOSIS — Z952 Presence of prosthetic heart valve: Secondary | ICD-10-CM

## 2022-03-13 NOTE — Progress Notes (Signed)
Daily Session Note  Patient Details  Name: Melody Soto MRN: [id number] Date of Birth: Jan 19, 1951 Referring Provider:   Flowsheet Row Cardiac Rehab from 02/28/2022 in [facility] Cardiac and Pulmonary Rehab  Referring Provider Lujean Amel MD       Encounter Date: 03/13/2022  Check In:  Session Check In - 03/13/22 1110       Check-In   Supervising physician immediately available to respond to emergencies See telemetry face sheet for immediately available ER MD    Location [facility]-Cardiac & Pulmonary Rehab    Staff Present Renita Papa, RN BSN;Noah Tickle, BS, Exercise Physiologist;Jessica Canadian, MA, RCEP, CCRP, CCET    Virtual Visit No    Medication changes reported     No    Fall or balance concerns reported    No    Warm-up and Cool-down Performed on first and last piece of equipment    Resistance Training Performed Yes    VAD Patient? No    PAD/SET Patient? No      Pain Assessment   Currently in Pain? No/denies                Social History   Tobacco Use  Smoking Status Never  Smokeless Tobacco Never    Goals Met:  Independence with exercise equipment Exercise tolerated well No report of concerns or symptoms today Strength training completed today  Goals Unmet:  Not Applicable  Comments: Pt able to follow exercise prescription today without complaint.  Will continue to monitor for progression.    Dr. Emily Filbert is Medical Director for [place].  Dr. Ottie Glazier is Medical Director for [facility] Pulmonary Rehabilitation.

## 2022-03-15 ENCOUNTER — Encounter: Payer: Medicare HMO | Attending: Internal Medicine | Admitting: *Deleted

## 2022-03-15 DIAGNOSIS — Z952 Presence of prosthetic heart valve: Secondary | ICD-10-CM | POA: Diagnosis not present

## 2022-03-15 DIAGNOSIS — I10 Essential (primary) hypertension: Secondary | ICD-10-CM | POA: Diagnosis not present

## 2022-03-15 DIAGNOSIS — I480 Paroxysmal atrial fibrillation: Secondary | ICD-10-CM | POA: Diagnosis not present

## 2022-03-15 HISTORY — PX: AORTIC VALVE REPLACEMENT: SHX41

## 2022-03-15 NOTE — Progress Notes (Signed)
Daily Session Note  Patient Details  Name: Melody Soto MRN: [id number] Date of Birth: 09-May-1950 Referring Provider:   Flowsheet Row Cardiac Rehab from 02/28/2022 in [facility] Cardiac and Pulmonary Rehab  Referring Provider Lujean Amel MD       Encounter Date: 03/15/2022  Check In:  Session Check In - 03/15/22 1112       Check-In   Supervising physician immediately available to respond to emergencies See telemetry face sheet for immediately available ER MD    Location [facility]-Cardiac & Pulmonary Rehab    Staff Present Darlyne Russian, RN, ADN;Meredith Sherryll Burger, RN Abel Presto, MS, ACSM CEP, Exercise Physiologist;Joseph Tessie Fass, [place]    Virtual Visit No    Medication changes reported     No    Fall or balance concerns reported    No    Warm-up and Cool-down Performed on first and last piece of equipment    Resistance Training Performed Yes    VAD Patient? No    PAD/SET Patient? No      Pain Assessment   Currently in Pain? No/denies                Social History   Tobacco Use  Smoking Status Never  Smokeless Tobacco Never    Goals Met:  Independence with exercise equipment Exercise tolerated well No report of concerns or symptoms today Strength training completed today  Goals Unmet:  Not Applicable  Comments: Pt able to follow exercise prescription today without complaint.  Will continue to monitor for progression.    Dr. Emily Filbert is Medical Director for [place].  Dr. Ottie Glazier is Medical Director for [facility] Pulmonary Rehabilitation.

## 2022-03-17 ENCOUNTER — Other Ambulatory Visit: Payer: Self-pay | Admitting: Family Medicine

## 2022-03-17 DIAGNOSIS — I480 Paroxysmal atrial fibrillation: Secondary | ICD-10-CM

## 2022-03-20 ENCOUNTER — Encounter: Payer: Medicare HMO | Admitting: *Deleted

## 2022-03-20 DIAGNOSIS — Z952 Presence of prosthetic heart valve: Secondary | ICD-10-CM | POA: Diagnosis not present

## 2022-03-20 NOTE — Progress Notes (Signed)
Daily Session Note  Patient Details  Name: Melody Soto MRN: [id number] Date of Birth: 09-12-1950 Referring Provider:   Flowsheet Row Cardiac Rehab from 02/28/2022 in [facility] Cardiac and Pulmonary Rehab  Referring Provider Lujean Amel MD       Encounter Date: 03/20/2022  Check In:  Session Check In - 03/20/22 1114       Check-In   Supervising physician immediately available to respond to emergencies See telemetry face sheet for immediately available ER MD    Location [facility]-Cardiac & Pulmonary Rehab    Staff Present Renita Papa, RN BSN;Joseph [place], RCP,RRT,BSRT;Jessica Hunter, [place], [place], CCRP, [place], MS, ACSM CEP, Exercise Physiologist    Virtual Visit No    Medication changes reported     Yes    Comments changed lasix to prn    Fall or balance concerns reported    No    Warm-up and Cool-down Performed on first and last piece of equipment    Resistance Training Performed Yes    VAD Patient? No    PAD/SET Patient? No      Pain Assessment   Currently in Pain? No/denies                Social History   Tobacco Use  Smoking Status Never  Smokeless Tobacco Never    Goals Met:  Independence with exercise equipment Exercise tolerated well No report of concerns or symptoms today Strength training completed today  Goals Unmet:  Not Applicable  Comments: Pt able to follow exercise prescription today without complaint.  Will continue to monitor for progression.    Dr. Emily Filbert is Medical Director for [place].  Dr. Ottie Glazier is Medical Director for [facility] Pulmonary Rehabilitation.

## 2022-03-22 ENCOUNTER — Encounter: Payer: Medicare HMO | Admitting: *Deleted

## 2022-03-22 DIAGNOSIS — Z952 Presence of prosthetic heart valve: Secondary | ICD-10-CM | POA: Diagnosis not present

## 2022-03-22 NOTE — Progress Notes (Signed)
Daily Session Note  Patient Details  Name: Melody Soto MRN: [id number] Date of Birth: Sep 17, 1950 Referring Provider:   Flowsheet Row Cardiac Rehab from 02/28/2022 in [facility] Cardiac and Pulmonary Rehab  Referring Provider Lujean Amel MD       Encounter Date: 03/22/2022  Check In:  Session Check In - 03/22/22 1039       Check-In   Supervising physician immediately available to respond to emergencies See telemetry face sheet for immediately available ER MD    Location [facility]-Cardiac & Pulmonary Rehab    Staff Present Renita Papa, RN Abel Presto, MS, ACSM CEP, Exercise Physiologist;Noah Tickle, BS, Exercise Physiologist    Virtual Visit No    Medication changes reported     No    Fall or balance concerns reported    No    Warm-up and Cool-down Performed on first and last piece of equipment    Resistance Training Performed Yes    VAD Patient? No    PAD/SET Patient? No      Pain Assessment   Currently in Pain? No/denies                Social History   Tobacco Use  Smoking Status Never  Smokeless Tobacco Never    Goals Met:  Independence with exercise equipment Exercise tolerated well No report of concerns or symptoms today Strength training completed today  Goals Unmet:  Not Applicable  Comments: Pt able to follow exercise prescription today without complaint.  Will continue to monitor for progression.    Dr. Emily Filbert is Medical Director for [place].  Dr. Ottie Glazier is Medical Director for [facility] Pulmonary Rehabilitation.

## 2022-03-23 ENCOUNTER — Other Ambulatory Visit: Payer: Self-pay | Admitting: Family Medicine

## 2022-03-23 DIAGNOSIS — I480 Paroxysmal atrial fibrillation: Secondary | ICD-10-CM

## 2022-03-27 ENCOUNTER — Encounter: Payer: Medicare HMO | Admitting: *Deleted

## 2022-03-27 DIAGNOSIS — Z952 Presence of prosthetic heart valve: Secondary | ICD-10-CM | POA: Diagnosis not present

## 2022-03-27 NOTE — Progress Notes (Signed)
Daily Session Note  Patient Details  Name: Melody Soto MRN: [id number] Date of Birth: 07-30-1950 Referring Provider:   Flowsheet Row Cardiac Rehab from 02/28/2022 in [facility] Cardiac and Pulmonary Rehab  Referring Provider Lujean Amel MD       Encounter Date: 03/27/2022  Check In:  Session Check In - 03/27/22 1120       Check-In   Supervising physician immediately available to respond to emergencies See telemetry face sheet for immediately available ER MD    Location [facility]-Cardiac & Pulmonary Rehab    Staff Present Renita Papa, RN BSN;Jessica Luan [place]ing, MA, RCEP, CCRP, CCET;Noah Tickle, BS, Exercise Physiologist    Virtual Visit No    Medication changes reported     No    Fall or balance concerns reported    No    Warm-up and Cool-down Performed on first and last piece of equipment    Resistance Training Performed Yes    VAD Patient? No    PAD/SET Patient? No      Pain Assessment   Currently in Pain? No/denies                Social History   Tobacco Use  Smoking Status Never  Smokeless Tobacco Never    Goals Met:  Independence with exercise equipment Exercise tolerated well No report of concerns or symptoms today Strength training completed today  Goals Unmet:  Not Applicable  Comments: Pt able to follow exercise prescription today without complaint.  Will continue to monitor for progression.    Dr. Emily Filbert is Medical Director for [place].  Dr. Ottie Glazier is Medical Director for [facility] Pulmonary Rehabilitation.

## 2022-03-29 ENCOUNTER — Other Ambulatory Visit: Payer: Self-pay | Admitting: Family Medicine

## 2022-03-29 ENCOUNTER — Encounter: Payer: Medicare HMO | Admitting: *Deleted

## 2022-03-29 DIAGNOSIS — I480 Paroxysmal atrial fibrillation: Secondary | ICD-10-CM

## 2022-03-29 DIAGNOSIS — Z952 Presence of prosthetic heart valve: Secondary | ICD-10-CM | POA: Diagnosis not present

## 2022-03-29 NOTE — Progress Notes (Signed)
Daily Session Note  Patient Details  Name: Melody Soto MRN: [id number] Date of Birth: 1950-05-03 Referring Provider:   Flowsheet Row Cardiac Rehab from 02/28/2022 in [facility] Cardiac and Pulmonary Rehab  Referring Provider Lujean Amel MD       Encounter Date: 03/29/2022  Check In:  Session Check In - 03/29/22 1124       Check-In   Supervising physician immediately available to respond to emergencies See telemetry face sheet for immediately available ER MD    Location [facility]-Cardiac & Pulmonary Rehab    Staff Present Darlyne Russian, RN, ADN;Jessica Luan Pulling, MA, RCEP, CCRP, CCET;Joseph Phillips, RCP,RRT,BSRT;Noah Ogles[place], [place], Exercise Physiologist    Virtual Visit No    Medication changes reported     No    Fall or balance concerns reported    No    Warm-up and Cool-down Performed on first and last piece of equipment    Resistance Training Performed Yes    VAD Patient? No    PAD/SET Patient? No      Pain Assessment   Currently in Pain? No/denies                Social History   Tobacco Use  Smoking Status Never  Smokeless Tobacco Never    Goals Met:  Independence with exercise equipment Exercise tolerated well No report of concerns or symptoms today Strength training completed today  Goals Unmet:  Not Applicable  Comments: Pt able to follow exercise prescription today without complaint.  Will continue to monitor for progression.    Dr. Emily Filbert is Medical Director for [place].  Dr. Ottie Glazier is Medical Director for [facility] Pulmonary Rehabilitation.

## 2022-03-30 ENCOUNTER — Encounter: Payer: Medicare HMO | Admitting: *Deleted

## 2022-04-03 ENCOUNTER — Encounter: Payer: Medicare HMO | Admitting: *Deleted

## 2022-04-03 DIAGNOSIS — Z952 Presence of prosthetic heart valve: Secondary | ICD-10-CM

## 2022-04-03 NOTE — Progress Notes (Signed)
Daily Session Note  Patient Details  Name: Melody Soto MRN: [id number] Date of Birth: 1950-06-09 Referring Provider:   Flowsheet Row Cardiac Rehab from 02/28/2022 in [facility] Cardiac and Pulmonary Rehab  Referring Provider Lujean Amel MD       Encounter Date: 04/03/2022  Check In:  Session Check In - 04/03/22 1149       Check-In   Supervising physician immediately available to respond to emergencies See telemetry face sheet for immediately available ER MD    Location [facility]-Cardiac & Pulmonary Rehab    Staff Present Alberteen Sam, MA, RCEP, CCRP, CCET    Virtual Visit No    Medication changes reported     No    Warm-up and Cool-down Performed on first and last piece of equipment    Resistance Training Performed Yes    VAD Patient? No    PAD/SET Patient? No      Pain Assessment   Currently in Pain? No/denies    Multiple Pain Sites No                Social History   Tobacco Use  Smoking Status Never  Smokeless Tobacco Never    Goals Met:  Independence with exercise equipment Exercise tolerated well No report of concerns or symptoms today  Goals Unmet:  Not Applicable  Comments: Pt able to follow exercise prescription today without complaint.  Will continue to monitor for progression.    Dr. Emily Filbert is Medical Director for [place].  Dr. Ottie Glazier is Medical Director for [facility] Pulmonary Rehabilitation.

## 2022-04-04 ENCOUNTER — Encounter: Payer: Self-pay | Admitting: *Deleted

## 2022-04-04 DIAGNOSIS — Z952 Presence of prosthetic heart valve: Secondary | ICD-10-CM

## 2022-04-04 NOTE — Progress Notes (Signed)
Cardiac Individual Treatment Plan  Patient Details  Name: Melody Soto MRN: [id number] Date of Birth: [birth year] Referring Provider:   Flowsheet Row Cardiac Rehab from 02/28/2022 in [facility] Cardiac and Pulmonary Rehab  Referring Provider Lujean Amel MD       Initial Encounter Date:  Flowsheet Row Cardiac Rehab from 02/28/2022 in [facility] Cardiac and Pulmonary Rehab  Date 02/28/22       Visit Diagnosis: S/P aortic valve replacement  Patient's Home Medications on Admission:  Current Outpatient Medications:    amiodarone (PACERONE) 200 MG tablet, Take 1 tablet (200 mg total) by mouth daily., Disp: 30 tablet, Rfl: 0   Ascorbic Acid (VITAMIN C) 1000 MG tablet, Take 1,000 mg by mouth in the morning and at bedtime., Disp: , Rfl:    aspirin 81 MG chewable tablet, Chew 81 mg by mouth daily., Disp: , Rfl:    atorvastatin (LIPITOR) 40 MG tablet, Take 1 tablet (40 mg total) by mouth 2 (two) times a week., Disp: 24 tablet, Rfl: 1   diltiazem (CARDIZEM CD) 120 MG 24 hr capsule, Take 1 capsule (120 mg total) by mouth daily., Disp: 30 capsule, Rfl: 0   furosemide (LASIX) 40 MG tablet, Take 20 mg by mouth daily., Disp: , Rfl:    JARDIANCE 10 MG TABS tablet, Take 10 mg by mouth daily., Disp: , Rfl:    Magnesium 500 MG CAPS, Take 800 mg by mouth in the morning and at bedtime. , Disp: , Rfl:    metoprolol succinate (TOPROL-XL) 50 MG 24 hr tablet, Take 150 mg by mouth daily., Disp: , Rfl:    Omega-3 Fatty Acids (FISH OIL) 1000 MG CAPS, Take 1 capsule by mouth daily., Disp: , Rfl:    Potassium Chloride ER 20 MEQ TBCR, Take 2 tablets by mouth daily., Disp: , Rfl:    Vitamin D-Vitamin K (VITAMIN K2-VITAMIN D3 PO), Take by mouth in the morning and at bedtime., Disp: , Rfl:    vitamin E 200 UNIT capsule, Take 200 Units by mouth 2 (two) times daily., Disp: , Rfl:    zinc gluconate 50 MG tablet, Take 50 mg by mouth daily., Disp: , Rfl:   Past Medical History: Past Medical History:  Diagnosis Date    Aortic stenosis    Cataract    Depression    Dysrhythmia    Endometrial cancer determined by uterine biopsy ([place]) 10/29/2016   Rad tx's and internal brachytherapy.   Heart murmur    HX OF   Hyperlipidemia    Hypertension    CONTROLLED ON MEDS   Lymphedema    Melanoma ([place])    Left upper arm. 20 years ago   Shortness of breath dyspnea     Tobacco Use: Social History   Tobacco Use  Smoking Status Never  Smokeless Tobacco Never    Labs: Review Flowsheet  More data exists      Latest Ref Rng & Units 09/24/2019 12/21/2019 06/21/2020 12/23/2020 07/28/2021  Labs for ITP Cardiac and Pulmonary Rehab  Cholestrol <200 mg/dL - - 199  - 222   LDL (calc) mg/dL (calc) - - 118  - 135   HDL-C > OR = 50 mg/dL - - 59  - 65   Trlycerides <150 mg/dL - - 113  - 112   Hemoglobin A1c <5.7 % of total Hgb - 5.9  5.5  5.5  5.5   TCO2 22 - 32 mmol/L 25  - - - -     Exercise Target Goals: Exercise  Program Goal: Individual exercise prescription set using results from initial 6 min walk test and THRR while considering  patient's activity barriers and safety.   Exercise Prescription Goal: Initial exercise prescription builds to 30-45 minutes a day of aerobic activity, 2-3 days per week.  Home exercise guidelines will be given to patient during program as part of exercise prescription that the participant will acknowledge.   Education: Aerobic Exercise: - Group verbal and visual presentation on the components of exercise prescription. Introduces F.I.T.T principle from ACSM for exercise prescriptions.  Reviews F.I.T.T. principles of aerobic exercise including progression. Written material given at graduation. Flowsheet Row Cardiac Rehab from 03/22/2022 in [facility] Cardiac and Pulmonary Rehab  Education need identified 02/28/22       Education: Resistance Exercise: - Group verbal and visual presentation on the components of exercise prescription. Introduces F.I.T.T principle from ACSM for exercise  prescriptions  Reviews F.I.T.T. principles of resistance exercise including progression. Written material given at graduation.    Education: Exercise & Equipment Safety: - Individual verbal instruction and demonstration of equipment use and safety with use of the equipment. Flowsheet Row Cardiac Rehab from 03/22/2022 in [facility] Cardiac and Pulmonary Rehab  Date 02/28/22  Educator NT  Instruction Review Code 1- Verbalizes Understanding       Education: Exercise Physiology & General Exercise Guidelines: - Group verbal and written instruction with models to review the exercise physiology of the cardiovascular system and associated critical values. Provides general exercise guidelines with specific guidelines to those with heart or lung disease.    Education: Flexibility, Balance, Mind/Body Relaxation: - Group verbal and visual presentation with interactive activity on the components of exercise prescription. Introduces F.I.T.T principle from ACSM for exercise prescriptions. Reviews F.I.T.T. principles of flexibility and balance exercise training including progression. Also discusses the mind body connection.  Reviews various relaxation techniques to help reduce and manage stress (i.e. Deep breathing, progressive muscle relaxation, and visualization). Balance handout provided to take home. Written material given at graduation. Flowsheet Row Cardiac Rehab from 03/22/2022 in [facility] Cardiac and Pulmonary Rehab  Date 03/15/22  Educator [facility]  Instruction Review Code 1- Verbalizes Understanding       Activity Barriers & Risk Stratification:  Activity Barriers & Cardiac Risk Stratification - 02/28/22 1539       Activity Barriers & Cardiac Risk Stratification   Activity Barriers Arthritis;Other (comment)    Comments hip pain, lymphodema in legs   R>L    Cardiac Risk Stratification Low             6 Minute Walk:  6 Minute Walk     Row Name 02/28/22 1538         6 Minute Walk   Phase Initial      Distance 1040 feet     Walk Time 6 minutes     # of Rest Breaks 0     MPH 1.97     METS 1.93     RPE 11     Perceived Dyspnea  0     VO2 Peak 6.75     Symptoms Yes (comment)     Comments Leg Fatigue     Resting HR 62 bpm     Resting BP 118/70     Resting Oxygen Saturation  95 %     Exercise Oxygen Saturation  during 6 min walk 95 %     Max Ex. HR 93 bpm     Max Ex. BP 150/82     2 Minute  Post BP 124/80              Oxygen Initial Assessment:   Oxygen Re-Evaluation:   Oxygen Discharge (Final Oxygen Re-Evaluation):   Initial Exercise Prescription:  Initial Exercise Prescription - 02/28/22 1500       Date of Initial Exercise RX and Referring Provider   Date 02/28/22    Referring Provider Lujean Amel MD      Oxygen   Maintain Oxygen Saturation 88% or higher      Treadmill   MPH 1    Grade 0    Minutes 15    METs 1.8      NuStep   Level 2    SPM 80    Minutes 15    METs 1.93      Biostep-RELP   Level 1    SPM 60    Minutes 15    METs 1.93      Track   Laps 16    Minutes 15    METs 1.87      Prescription Details   Frequency (times per week) 3    Duration Progress to 30 minutes of continuous aerobic without signs/symptoms of physical distress      Intensity   THRR 40-80% of Max Heartrate 97-132    Ratings of Perceived Exertion 11-13    Perceived Dyspnea 0-4      Progression   Progression Continue to progress workloads to maintain intensity without signs/symptoms of physical distress.      Resistance Training   Training Prescription Yes    Weight 3 lb    Reps 10-15             Perform Capillary Blood Glucose checks as needed.  Exercise Prescription Changes:   Exercise Prescription Changes     Row Name 02/28/22 1500 03/19/22 1300 04/03/22 0800         Response to Exercise   Blood Pressure (Admit) 118/70 118/72 126/66     Blood Pressure (Exercise) 150/82 122/64 142/72     Blood Pressure (Exit) 124/80 100/62 124/76      Heart Rate (Admit) 62 bpm 57 bpm 63 bpm     Heart Rate (Exercise) 93 bpm 92 bpm 99 bpm     Heart Rate (Exit) 66 bpm 70 bpm 66 bpm     Oxygen Saturation (Admit) 95 % -- --     Oxygen Saturation (Exercise) 95 % -- --     Oxygen Saturation (Exit) 97 % -- --     Rating of Perceived Exertion (Exercise) 11 13 13     $ Perceived Dyspnea (Exercise) 0 -- --     Symptoms leg fatigue none none     Comments 6MWT Results 2nd full week of exercise --     Duration -- Progress to 30 minutes of  aerobic without signs/symptoms of physical distress Progress to 30 minutes of  aerobic without signs/symptoms of physical distress     Intensity -- THRR unchanged THRR unchanged       Progression   Progression -- Continue to progress workloads to maintain intensity without signs/symptoms of physical distress. Continue to progress workloads to maintain intensity without signs/symptoms of physical distress.     Average METs -- 2.33 2.35       Resistance Training   Training Prescription -- Yes Yes     Weight -- 3 lb 3 lb     Reps -- 10-15 10-15       Interval Training  Interval Training -- No No       Treadmill   MPH -- 1 --     Grade -- 0 --     Minutes -- 15 --     METs -- 1.8 --       NuStep   Level -- 3 3     Minutes -- 15 30     METs -- 2.8 2.9       Biostep-RELP   Level -- 3 3     Minutes -- 15 15     METs -- 3 2       Oxygen   Maintain Oxygen Saturation -- -- 88% or higher              Exercise Comments:   Exercise Comments     Row Name 02/28/22 0931 03/06/22 1131         Exercise Comments Virtual orientation call completed today. shehas an appointment on Date: 02/28/22 for EP eval and gym Orientation. Documentation of diagnosis can be found in [facility] Date: 12/07/2021 . 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 First full day of exercise!  Patient was oriented to gym and equipment including functions,  settings, policies, and procedures.  Patient's individual exercise prescription and treatment plan were reviewed.  All starting workloads were established based on the results of the 6 minute walk test done at initial orientation visit.  The plan for exercise progression was also introduced and progression will be customized based on patient's performance and goals.               Exercise Goals and Review:   Exercise Goals     Row Name 02/28/22 1540             Exercise Goals   Increase Physical Activity Yes       Intervention Provide advice, education, support and counseling about physical activity/exercise needs.;Develop an individualized exercise prescription for aerobic and resistive training based on initial evaluation findings, risk stratification, comorbidities and participant's personal goals.       Expected Outcomes Short Term: Attend rehab on a regular basis to increase amount of physical activity.;Long Term: Exercising regularly at least 3-5 days a week.;Long Term: Add in home exercise to make exercise part of routine and to increase amount of physical activity.       Increase Strength and Stamina Yes       Intervention Provide advice, education, support and counseling about physical activity/exercise needs.;Develop an individualized exercise prescription for aerobic and resistive training based on initial evaluation findings, risk stratification, comorbidities and participant's personal goals.       Expected Outcomes Short Term: Perform resistance training exercises routinely during rehab and add in resistance training at home;Short Term: Increase workloads from initial exercise prescription for resistance, speed, and METs.;Long Term: Improve cardiorespiratory fitness, muscular endurance and strength as measured by increased METs and functional capacity (6MWT)       Able to understand and use rate of perceived exertion (RPE) scale Yes       Intervention Provide education and  explanation on how to use RPE scale       Expected Outcomes Short Term: Able to use RPE daily in rehab to express subjective intensity level;Long Term:  Able to use RPE to guide intensity level when exercising independently       Able to understand and use Dyspnea scale Yes  Intervention Provide education and explanation on how to use Dyspnea scale       Expected Outcomes Short Term: Able to use Dyspnea scale daily in rehab to express subjective sense of shortness of breath during exertion;Long Term: Able to use Dyspnea scale to guide intensity level when exercising independently       Knowledge and understanding of Target Heart Rate Range (THRR) Yes       Intervention Provide education and explanation of THRR including how the numbers were predicted and where they are located for reference       Expected Outcomes Short Term: Able to state/look up THRR;Long Term: Able to use THRR to govern intensity when exercising independently;Short Term: Able to use daily as guideline for intensity in rehab       Able to check pulse independently Yes       Intervention Provide education and demonstration on how to check pulse in carotid and radial arteries.;Review the importance of being able to check your own pulse for safety during independent exercise       Expected Outcomes Short Term: Able to explain why pulse checking is important during independent exercise;Long Term: Able to check pulse independently and accurately       Understanding of Exercise Prescription Yes       Intervention Provide education, explanation, and written materials on patient's individual exercise prescription       Expected Outcomes Short Term: Able to explain program exercise prescription;Long Term: Able to explain home exercise prescription to exercise independently                Exercise Goals Re-Evaluation :  Exercise Goals Re-Evaluation     Row Name 03/06/22 1131 03/19/22 1328 04/03/22 0841         Exercise Goal  Re-Evaluation   Exercise Goals Review Increase Physical Activity;Able to understand and use rate of perceived exertion (RPE) scale;Knowledge and understanding of Target Heart Rate Range (THRR);Understanding of Exercise Prescription;Increase Strength and Stamina;Able to check pulse independently Increase Physical Activity;Understanding of Exercise Prescription;Increase Strength and Stamina Increase Physical Activity;Understanding of Exercise Prescription;Increase Strength and Stamina     Comments Reviewed RPE scale, THR and program prescription with pt today.  Pt voiced understanding and was given a copy of goals to take home. Jackelyn Poling is doing well for the first couple of weeks she has been at rehab. She has exercised at her initial exercise prescription well, working at levels 3 on both the T4 Nustep and Biostep. She walked 1 time at  a 1.0 mph on the treadmill and will encourage her to walk more as we further get into the program. Her RPEs are in appropriate range. Will continue to monitor. Jackelyn Poling continues to do well in rehab. She recently increased her overall average MET level to 2.35 METs. She also has continued to do well on seated machines at level 3 on both the T4 Nustep and the biostep. She has not done any walking since the last review. We will continue to monitor her progress in the program.     Expected Outcomes Short: Use RPE daily to regulate intensity.  Long: Follow program prescription in THR. Short: Follow exercise prescription, push to walk the treadmill more often Long: Build up overall strength and stamina Short: Return to walking the treadmill. Long: Continue to improve strength and stamina.              Discharge Exercise Prescription (Final Exercise Prescription Changes):  Exercise Prescription Changes - 04/03/22  0800       Response to Exercise   Blood Pressure (Admit) 126/66    Blood Pressure (Exercise) 142/72    Blood Pressure (Exit) 124/76    Heart Rate (Admit) 63 bpm     Heart Rate (Exercise) 99 bpm    Heart Rate (Exit) 66 bpm    Rating of Perceived Exertion (Exercise) 13    Symptoms none    Duration Progress to 30 minutes of  aerobic without signs/symptoms of physical distress    Intensity THRR unchanged      Progression   Progression Continue to progress workloads to maintain intensity without signs/symptoms of physical distress.    Average METs 2.35      Resistance Training   Training Prescription Yes    Weight 3 lb    Reps 10-15      Interval Training   Interval Training No      NuStep   Level 3    Minutes 30    METs 2.9      Biostep-RELP   Level 3    Minutes 15    METs 2      Oxygen   Maintain Oxygen Saturation 88% or higher             Nutrition:  Target Goals: Understanding of nutrition guidelines, daily intake of sodium <1527m, cholesterol <208m calories 30% from fat and 7% or less from saturated fats, daily to have 5 or more servings of fruits and vegetables.  Education: All About Nutrition: -Group instruction provided by verbal, written material, interactive activities, discussions, models, and posters to present general guidelines for heart healthy nutrition including fat, fiber, MyPlate, the role of sodium in heart healthy nutrition, utilization of the nutrition label, and utilization of this knowledge for meal planning. Follow up email sent as well. Written material given at graduation.   Biometrics:  Pre Biometrics - 02/28/22 1540       Pre Biometrics   Height 5' 9.5" (1.765 m)    Weight 219 lb 4.8 oz (99.5 kg)    Waist Circumference 36.5 inches    Hip Circumference 46 inches    Waist to Hip Ratio 0.79 %    BMI (Calculated) 31.93    Single Leg Stand 9.3 seconds   R             Nutrition Therapy Plan and Nutrition Goals:  Nutrition Therapy & Goals - 02/28/22 1531       Intervention Plan   Intervention Prescribe, educate and counsel regarding individualized specific dietary modifications aiming  towards targeted core components such as weight, hypertension, lipid management, diabetes, heart failure and other comorbidities.    Expected Outcomes Short Term Goal: A plan has been developed with personal nutrition goals set during dietitian appointment.;Short Term Goal: Understand basic principles of dietary content, such as calories, fat, sodium, cholesterol and nutrients.;Long Term Goal: Adherence to prescribed nutrition plan.             Nutrition Assessments:  MEDIFICTS Score Key: ?70 Need to make dietary changes  40-70 Heart Healthy Diet ? 40 Therapeutic Level Cholesterol Diet  [facility]  Picture Your Plate Total Score on Admission 57      Picture Your Plate Scores: <[id number] dietary pattern with much room for improvement. 41-50 Dietary pattern unlikely to meet recommendations for good health and room for improvement. 51-60 More healthful dietary pattern, with some room for improvement.  >  60 Healthy dietary pattern, although there may be some specific behaviors that could be improved.    Nutrition Goals Re-Evaluation:  Nutrition Goals Re-Evaluation     [place] Name 03/22/22 1118             Goals   Comment Patient deferrs dietitian appointment. Will follow up with patient.                Nutrition Goals Discharge (Final Nutrition Goals Re-Evaluation):  Nutrition Goals Re-Evaluation - 03/22/22 1118       Goals   Comment Patient deferrs dietitian appointment. Will follow up with patient.             Psychosocial: Target Goals: Acknowledge presence or absence of significant depression and/or stress, maximize coping skills, provide positive support system. Participant is able to verbalize types and ability to use techniques and skills needed for reducing stress and depression.   Education: Stress, Anxiety, and Depression - Group verbal and visual presentation to define topics  covered.  Reviews how body is impacted by stress, anxiety, and depression.  Also discusses healthy ways to reduce stress and to treat/manage anxiety and depression.  Written material given at graduation.   Education: Sleep Hygiene -Provides group verbal and written instruction about how sleep can affect your health.  Define sleep hygiene, discuss sleep cycles and impact of sleep habits. Review good sleep hygiene tips.    Initial Review & Psychosocial Screening:  Initial Psych Review & Screening - 02/28/22 0916       Initial Review   Current issues with None Identified      Family Dynamics   Good Support System? Yes   friends, daughter     Barriers   Psychosocial barriers to participate in program There are no identifiable barriers or psychosocial needs.      Screening Interventions   Interventions Encouraged to exercise;To provide support and resources with identified psychosocial needs;Provide feedback about the scores to participant    Expected Outcomes Short Term goal: Utilizing psychosocial counselor, staff and physician to assist with identification of specific Stressors or current issues interfering with healing process. Setting desired goal for each stressor or current issue identified.;Long Term Goal: Stressors or current issues are controlled or eliminated.;Short Term goal: Identification and review with participant of any Quality of Life or Depression concerns found by scoring the questionnaire.;Long Term goal: The participant improves quality of Life and PHQ9 Scores as seen by post scores and/or verbalization of changes             Quality of Life Scores:   Quality of Life - 02/28/22 1158       Quality of Life   Select Quality of Life      Quality of Life Scores   Health/Function Pre 20.6 %    Socioeconomic Pre 22.31 %    Psych/Spiritual Pre 28.93 %    Family Pre 23.4 %    GLOBAL Pre 23.09 %            Scores of 19 and below usually indicate a poorer  quality of life in these areas.  A difference of  2-3 points is a clinically meaningful difference.  A difference of 2-3 points in the total score of the Quality of Life Index has been associated with significant improvement in overall quality of life, self-image, physical symptoms, and general health in studies assessing change in quality of life.  PHQ-9: Review Flowsheet  More data exists  02/28/2022 02/23/2022 01/31/2022 12/01/2021 09/12/2021  Depression screen PHQ 2/9  Decreased Interest 0 0 0 1 0  Down, Depressed, Hopeless 0 0 0 0 0  PHQ - 2 Score 0 0 0 1 0  Altered sleeping 0 0 0 0 0  Tired, decreased energy 0 0 0 3 0  Change in appetite 0 0 0 0 0  Feeling bad or failure about yourself  0 0 0 0 0  Trouble concentrating 0 0 0 0 0  Moving slowly or fidgety/restless 0 0 0 0 0  Suicidal thoughts 0 0 0 0 0  PHQ-9 Score 0 0 0 4 0  Difficult doing work/chores Not difficult at all - - - -   Interpretation of Total Score  Total Score Depression Severity:  1-4 = Minimal depression, 5-9 = Mild depression, 10-14 = Moderate depression, 15-19 = Moderately severe depression, 20-27 = Severe depression   Psychosocial Evaluation and Intervention:  Psychosocial Evaluation - 02/28/22 0932       Psychosocial Evaluation & Interventions   Interventions Encouraged to exercise with the program and follow exercise prescription    Comments Jackelyn Poling has no barriers to starting the program.  [facility] had open hgeart VAlve replacement after a TAVR and infection. She has a great support system, friends and her daughter.  [name] stayed at her friends home for 3 weeks after discharge. She has completed her PT OT at home and is ready to continue working on gaining strength and stamina back.  She lives alone and has one cat at home. She is a retired Pharmacist, hospital.    Expected Outcomes [facility] Jackelyn Poling is able to attend all scheduled sessions and progress with her exercise prescription LTG Jackelyn Poling continues with her exercise  progression after discharge    Continue Psychosocial Services  Follow up required by staff             Psychosocial Re-Evaluation:  Psychosocial Re-Evaluation     [place] Name 03/22/22 1120             Psychosocial Re-Evaluation   Current issues with None Identified       Comments Patient reports no issues with their current mental states, sleep, stress, depression or anxiety. Will follow up with patient in a few weeks for any changes.       Expected Outcomes Short: Continue to exercise regularly to support mental health and notify staff of any changes. Long: maintain mental health and well being through teaching of rehab or prescribed medications independently.       Interventions Encouraged to attend Cardiac Rehabilitation for the exercise       Continue Psychosocial Services  Follow up required by staff                Psychosocial Discharge (Final Psychosocial Re-Evaluation):  Psychosocial Re-Evaluation - 03/22/22 1120       Psychosocial Re-Evaluation   Current issues with None Identified    Comments Patient reports no issues with their current mental states, sleep, stress, depression or anxiety. Will follow up with patient in a few weeks for any changes.    Expected Outcomes Short: Continue to exercise regularly to support mental health and notify staff of any changes. Long: maintain mental health and well being through teaching of rehab or prescribed medications independently.    Interventions Encouraged to attend Cardiac Rehabilitation for the exercise    Continue Psychosocial Services  Follow up required by staff  Vocational Rehabilitation: Provide vocational rehab assistance to qualifying candidates.   Vocational Rehab Evaluation & Intervention:  Vocational Rehab - 02/28/22 0919       Initial Vocational Rehab Evaluation & Intervention   Assessment shows need for Vocational Rehabilitation No      Vocational Rehab Re-Evaulation   Comments  retired             Education: Education Goals: Education classes will be provided on a variety of topics geared toward better understanding of heart health and risk factor modification. Participant will state understanding/return demonstration of topics presented as noted by education test scores.  Learning Barriers/Preferences:  Learning Barriers/Preferences - 02/28/22 0919       Learning Barriers/Preferences   Learning Barriers None    Learning Preferences Audio             General Cardiac Education Topics:  AED/CPR: - Group verbal and written instruction with the use of models to demonstrate the basic use of the AED with the basic ABC's of resuscitation.   Anatomy and Cardiac Procedures: - Group verbal and visual presentation and models provide information about basic cardiac anatomy and function. Reviews the testing methods done to diagnose heart disease and the outcomes of the test results. Describes the treatment choices: Medical Management, Angioplasty, or Coronary Bypass Surgery for treating various heart conditions including Myocardial Infarction, Angina, Valve Disease, and Cardiac Arrhythmias.  Written material given at graduation. Flowsheet Row Cardiac Rehab from 03/22/2022 in [facility] Cardiac and Pulmonary Rehab  Date 03/22/22  Educator SB  Instruction Review Code 1- Verbalizes Understanding       Medication Safety: - Group verbal and visual instruction to review commonly prescribed medications for heart and lung disease. Reviews the medication, class of the drug, and side effects. Includes the steps to properly store meds and maintain the prescription regimen.  Written material given at graduation.   Intimacy: - Group verbal instruction through game format to discuss how heart and lung disease can affect sexual intimacy. Written material given at graduation..   Know Your Numbers and Heart Failure: - Group verbal and visual instruction to discuss disease risk  factors for cardiac and pulmonary disease and treatment options.  Reviews associated critical values for Overweight/Obesity, Hypertension, Cholesterol, and Diabetes.  Discusses basics of heart failure: signs/symptoms and treatments.  Introduces Heart Failure Zone chart for action plan for heart failure.  Written material given at graduation.   Infection Prevention: - Provides verbal and written material to individual with discussion of infection control including proper hand washing and proper equipment cleaning during exercise session. Flowsheet Row Cardiac Rehab from 03/22/2022 in [facility] Cardiac and Pulmonary Rehab  Date 02/28/22  Educator NT  Instruction Review Code 1- Verbalizes Understanding       Falls Prevention: - Provides verbal and written material to individual with discussion of falls prevention and safety. Flowsheet Row Cardiac Rehab from 03/22/2022 in [facility] Cardiac and Pulmonary Rehab  Date 02/28/22  Educator SB  Instruction Review Code 1- Verbalizes Understanding       Other: -Provides group and verbal instruction on various topics (see comments)   Knowledge Questionnaire Score:  Knowledge Questionnaire Score - 02/28/22 1159       Knowledge Questionnaire Score   Pre Score 25/26   exercise             Core Components/Risk Factors/Patient Goals at Admission:  Personal Goals and Risk Factors at Admission - 02/28/22 1532       Core Components/Risk Factors/Patient  Goals on Admission    Weight Management Yes    Intervention Weight Management: Develop a combined nutrition and exercise program designed to reach desired caloric intake, while maintaining appropriate intake of nutrient and fiber, sodium and fats, and appropriate energy expenditure required for the weight goal.;Weight Management: Provide education and appropriate resources to help participant work on and attain dietary goals.    Admit Weight 219 lb 4.8 oz (99.5 kg)    Goal Weight: Short Term 215 lb (97.5  kg)    Goal Weight: Long Term 210 lb (95.3 kg)    Expected Outcomes Short Term: Continue to assess and modify interventions until short term weight is achieved;Long Term: Adherence to nutrition and physical activity/exercise program aimed toward attainment of established weight goal;Weight Maintenance: Understanding of the daily nutrition guidelines, which includes 25-35% calories from fat, 7% or less cal from saturated fats, less than 234m cholesterol, less than 1.5gm of sodium, & 5 or more servings of fruits and vegetables daily    Hypertension Yes    Intervention Provide education on lifestyle modifcations including regular physical activity/exercise, weight management, moderate sodium restriction and increased consumption of fresh fruit, vegetables, and low fat dairy, alcohol moderation, and smoking cessation.;Monitor prescription use compliance.    Expected Outcomes Short Term: Continued assessment and intervention until BP is < 140/953mHG in hypertensive participants. < 130/8030mG in hypertensive participants with diabetes, heart failure or chronic kidney disease.;Long Term: Maintenance of blood pressure at goal levels.    Lipids Yes    Intervention Provide education and support for participant on nutrition & aerobic/resistive exercise along with prescribed medications to achieve LDL <76m56mDL >40mg35m Expected Outcomes Short Term: Participant states understanding of desired cholesterol values and is compliant with medications prescribed. Participant is following exercise prescription and nutrition guidelines.;Long Term: Cholesterol controlled with medications as prescribed, with individualized exercise RX and with personalized nutrition plan. Value goals: LDL < 76mg,59m > 40 mg.             Education:Diabetes - Individual verbal and written instruction to review signs/symptoms of diabetes, desired ranges of glucose level fasting, after meals and with exercise. Acknowledge that pre and  post exercise glucose checks will be done for 3 sessions at entry of program.   Core Components/Risk Factors/Patient Goals Review:   Goals and Risk Factor Review     Row Name 03/22/22 1114             Core Components/Risk Factors/Patient Goals Review   Personal Goals Review Weight Management/Obesity       Review Deb wants to reach a weight goal of under 200 pounds. She feels like her appetite has increased since she has some energy back. Her doctor took her off lasix for a moment but it was not working out for her due to weight increase that was most likely fluid.       Expected Outcomes Short: lose a few pounds in a couple weeks. Long: reach weight goal of under 200 pounds.                Core Components/Risk Factors/Patient Goals at Discharge (Final Review):   Goals and Risk Factor Review - 03/22/22 1114       Core Components/Risk Factors/Patient Goals Review   Personal Goals Review Weight Management/Obesity    Review Deb wants to reach a weight goal of under 200 pounds. She feels like her appetite has increased since she has some energy back. Her doctor  took her off lasix for a moment but it was not working out for her due to weight increase that was most likely fluid.    Expected Outcomes Short: lose a few pounds in a couple weeks. Long: reach weight goal of under 200 pounds.             ITP Comments:  ITP Comments     Row Name 02/28/22 0930 02/28/22 1531 03/06/22 1131 03/07/22 0958 04/04/22 1428   ITP Comments Virtual orientation call completed today. shehas an appointment on Date: 02/28/22 for EP eval and gym Orientation.  Documentation of diagnosis can be found in [facility] Date: 12/07/2021 .     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 Completed 6MWT and gym orientation. Initial ITP created and sent for review to Dr. Emily Filbert, Medical Director. First full day of exercise!  Patient was  oriented to gym and equipment including functions, settings, policies, and procedures.  Patient's individual exercise prescription and treatment plan were reviewed.  All starting workloads were established based on the results of the 6 minute walk test done at initial orientation visit.  The plan for exercise progression was also introduced and progression will be customized based on patient's performance and goals. 30 Day review completed. Medical Director ITP review done, changes made as directed, and signed approval by Medical Director.   new to program 30 day review completed. ITP sent to Dr. Emily Filbert, Medical Director of Cardiac Rehab. Continue with ITP unless changes are made by physician.            Comments: 30 day review

## 2022-04-06 ENCOUNTER — Other Ambulatory Visit: Payer: Self-pay | Admitting: Family Medicine

## 2022-04-06 ENCOUNTER — Encounter: Payer: Medicare HMO | Admitting: *Deleted

## 2022-04-06 DIAGNOSIS — Z952 Presence of prosthetic heart valve: Secondary | ICD-10-CM | POA: Diagnosis not present

## 2022-04-06 DIAGNOSIS — I480 Paroxysmal atrial fibrillation: Secondary | ICD-10-CM

## 2022-04-06 NOTE — Progress Notes (Signed)
Daily Session Note  Patient Details  Name: Melody Soto MRN: [id number] Date of Birth: 03/22/1950 Referring Provider:   Flowsheet Row Cardiac Rehab from 02/28/2022 in [facility] Cardiac and Pulmonary Rehab  Referring Provider Lujean Amel MD       Encounter Date: 04/06/2022  Check In:  Session Check In - 04/06/22 1124       Check-In   Supervising physician immediately available to respond to emergencies See telemetry face sheet for immediately available ER MD    Location [facility]-Cardiac & Pulmonary Rehab    Staff Present Alberteen Sam, MA, RCEP, CCRP, CCET;Joseph Bentley, RCP,RRT,BSRT;Other   Darel Hong, RN BSN   Virtual Visit No    Medication changes reported     No    Fall or balance concerns reported    No    Tobacco Cessation No Change    Warm-up and Cool-down Performed on first and last piece of equipment    Resistance Training Performed Yes    VAD Patient? No    PAD/SET Patient? No      Pain Assessment   Currently in Pain? No/denies                Social History   Tobacco Use  Smoking Status Never  Smokeless Tobacco Never    Goals Met:  Independence with exercise equipment Exercise tolerated well No report of concerns or symptoms today Strength training completed today  Goals Unmet:  Not Applicable  Comments: Pt able to follow exercise prescription today without complaint.  Will continue to monitor for progression.    Dr. Emily Filbert is Medical Director for [place].  Dr. Ottie Glazier is Medical Director for [facility] Pulmonary Rehabilitation.

## 2022-04-07 DIAGNOSIS — G4733 Obstructive sleep apnea (adult) (pediatric): Secondary | ICD-10-CM | POA: Diagnosis not present

## 2022-04-07 DIAGNOSIS — M1990 Unspecified osteoarthritis, unspecified site: Secondary | ICD-10-CM | POA: Diagnosis not present

## 2022-04-07 DIAGNOSIS — I10 Essential (primary) hypertension: Secondary | ICD-10-CM | POA: Diagnosis not present

## 2022-04-07 DIAGNOSIS — E119 Type 2 diabetes mellitus without complications: Secondary | ICD-10-CM | POA: Diagnosis not present

## 2022-04-07 DIAGNOSIS — J439 Emphysema, unspecified: Secondary | ICD-10-CM | POA: Diagnosis not present

## 2022-04-07 DIAGNOSIS — Z8249 Family history of ischemic heart disease and other diseases of the circulatory system: Secondary | ICD-10-CM | POA: Diagnosis not present

## 2022-04-07 DIAGNOSIS — N3946 Mixed incontinence: Secondary | ICD-10-CM | POA: Diagnosis not present

## 2022-04-07 DIAGNOSIS — E669 Obesity, unspecified: Secondary | ICD-10-CM | POA: Diagnosis not present

## 2022-04-07 DIAGNOSIS — I4891 Unspecified atrial fibrillation: Secondary | ICD-10-CM | POA: Diagnosis not present

## 2022-04-07 DIAGNOSIS — Z809 Family history of malignant neoplasm, unspecified: Secondary | ICD-10-CM | POA: Diagnosis not present

## 2022-04-07 DIAGNOSIS — M8580 Other specified disorders of bone density and structure, unspecified site: Secondary | ICD-10-CM | POA: Diagnosis not present

## 2022-04-07 DIAGNOSIS — E785 Hyperlipidemia, unspecified: Secondary | ICD-10-CM | POA: Diagnosis not present

## 2022-04-07 DIAGNOSIS — Z008 Encounter for other general examination: Secondary | ICD-10-CM | POA: Diagnosis not present

## 2022-04-12 ENCOUNTER — Encounter: Payer: Medicare HMO | Admitting: *Deleted

## 2022-04-12 DIAGNOSIS — Z952 Presence of prosthetic heart valve: Secondary | ICD-10-CM

## 2022-04-12 NOTE — Progress Notes (Signed)
Daily Session Note  Patient Details  Name: Melody Soto MRN: [id number] Date of Birth: 1950/12/12 Referring Provider:   Flowsheet Row Cardiac Rehab from 02/28/2022 in [facility] Cardiac and Pulmonary Rehab  Referring Provider Lujean Amel MD       Encounter Date: 04/12/2022  Check In:  Session Check In - 04/12/22 1041       Check-In   Supervising physician immediately available to respond to emergencies See telemetry face sheet for immediately available ER MD    Location [facility]-Cardiac & Pulmonary Rehab    Staff Present Larna Daughters, MS, ACSM CEP, Exercise Physiologist;Jessica Luan Pulling, MA, RCEP, CCRP, CCET;Laureen Owens Shark, BS, RRT, CPFT;Megan Tamala Julian, RN, [place];Other   Darel Hong RN BSN   Virtual Visit No    Medication changes reported     No    Fall or balance concerns reported    No    Warm-up and Cool-down Performed on first and last piece of equipment    Resistance Training Performed Yes    VAD Patient? No    PAD/SET Patient? No      Pain Assessment   Currently in Pain? No/denies                Social History   Tobacco Use  Smoking Status Never  Smokeless Tobacco Never    Goals Met:  Independence with exercise equipment Personal goals reviewed No report of concerns or symptoms today Strength training completed today  Goals Unmet:  Not Applicable  Comments: Pt able to follow exercise prescription today without complaint.  Will continue to monitor for progression.    Dr. Emily Filbert is Medical Director for [place].  Dr. Ottie Glazier is Medical Director for [facility] Pulmonary Rehabilitation.

## 2022-04-13 ENCOUNTER — Encounter: Payer: Medicare HMO | Attending: Internal Medicine | Admitting: *Deleted

## 2022-04-13 DIAGNOSIS — Z952 Presence of prosthetic heart valve: Secondary | ICD-10-CM | POA: Diagnosis not present

## 2022-04-13 NOTE — Progress Notes (Signed)
Daily Session Note  Patient Details  Name: Melody Soto MRN: [id number] Date of Birth: 12/10/1950 Referring Provider:   Flowsheet Row Cardiac Rehab from 02/28/2022 in [facility] Cardiac and Pulmonary Rehab  Referring Provider Lujean Amel MD       Encounter Date: 04/13/2022  Check In:  Session Check In - 04/13/22 1124       Check-In   Supervising physician immediately available to respond to emergencies See telemetry face sheet for immediately available ER MD    Location [facility]-Cardiac & Pulmonary Rehab    Staff Present Darlyne Russian, RN, ADN;Jessica Luan Pulling, MA, RCEP, CCRP, CCET;Joseph [place], [place]    Virtual Visit No    Medication changes reported     No    Fall or balance concerns reported    No    Warm-up and Cool-down Performed on first and last piece of equipment    Resistance Training Performed Yes    VAD Patient? No    PAD/SET Patient? No      Pain Assessment   Currently in Pain? No/denies                Social History   Tobacco Use  Smoking Status Never  Smokeless Tobacco Never    Goals Met:  Independence with exercise equipment Exercise tolerated well No report of concerns or symptoms today Strength training completed today  Goals Unmet:  Not Applicable  Comments: Pt able to follow exercise prescription today without complaint.  Will continue to monitor for progression.    Dr. Emily Filbert is Medical Director for [place].  Dr. Ottie Glazier is Medical Director for [facility] Pulmonary Rehabilitation.

## 2022-04-16 ENCOUNTER — Other Ambulatory Visit: Payer: Self-pay | Admitting: Family Medicine

## 2022-04-16 DIAGNOSIS — E785 Hyperlipidemia, unspecified: Secondary | ICD-10-CM

## 2022-04-18 ENCOUNTER — Other Ambulatory Visit: Payer: Self-pay

## 2022-04-18 DIAGNOSIS — E785 Hyperlipidemia, unspecified: Secondary | ICD-10-CM

## 2022-04-18 DIAGNOSIS — I700 Atherosclerosis of aorta: Secondary | ICD-10-CM

## 2022-04-18 DIAGNOSIS — I10 Essential (primary) hypertension: Secondary | ICD-10-CM

## 2022-04-19 ENCOUNTER — Encounter: Payer: Medicare HMO | Admitting: *Deleted

## 2022-04-19 DIAGNOSIS — Z952 Presence of prosthetic heart valve: Secondary | ICD-10-CM

## 2022-04-19 NOTE — Progress Notes (Signed)
Daily Session Note  Patient Details  Name: Melody Soto MRN: [id number] Date of Birth: [birth year] Referring Provider:   Flowsheet Row Cardiac Rehab from 02/28/2022 in [facility] Cardiac and Pulmonary Rehab  Referring Provider Lujean Amel MD       Encounter Date: 04/19/2022  Check In:  Session Check In - 04/19/22 1126       Check-In   Supervising physician immediately available to respond to emergencies See telemetry face sheet for immediately available ER MD    Location [facility]-Cardiac & Pulmonary Rehab    Staff Present Darlyne Russian, RN, ADN;Meredith Sherryll Burger, RN BSN;Jessica Luan Pulling, MA, RCEP, CCRP, Bertram Gala, MS, ACSM CEP, Exercise Physiologist    Virtual Visit No    Medication changes reported     No    Fall or balance concerns reported    No    Warm-up and Cool-down Performed on first and last piece of equipment    Resistance Training Performed Yes    VAD Patient? No    PAD/SET Patient? No      Pain Assessment   Currently in Pain? No/denies                Social History   Tobacco Use  Smoking Status Never  Smokeless Tobacco Never    Goals Met:  Independence with exercise equipment Exercise tolerated well No report of concerns or symptoms today Strength training completed today  Goals Unmet:  Not Applicable  Comments: Pt able to follow exercise prescription today without complaint.  Will continue to monitor for progression.    Dr. Emily Filbert is Medical Director for [place].  Dr. Ottie Glazier is Medical Director for [facility] Pulmonary Rehabilitation.

## 2022-04-20 ENCOUNTER — Encounter: Payer: Medicare HMO | Admitting: *Deleted

## 2022-04-20 DIAGNOSIS — Z952 Presence of prosthetic heart valve: Secondary | ICD-10-CM | POA: Diagnosis not present

## 2022-04-20 NOTE — Progress Notes (Signed)
Daily Session Note  Patient Details  Name: Melody Soto MRN: [id number] Date of Birth: 10/30/1950 Referring Provider:   Flowsheet Row Cardiac Rehab from 02/28/2022 in [facility] Cardiac and Pulmonary Rehab  Referring Provider Lujean Amel MD       Encounter Date: 04/20/2022  Check In:  Session Check In - 04/20/22 1131       Check-In   Supervising physician immediately available to respond to emergencies See telemetry face sheet for immediately available ER MD    Location [facility]-Cardiac & Pulmonary Rehab    Staff Present Heath Lark, RN, BSN, CCRP;Jessica [place], MA, RCEP, CCRP, CCET;Joseph [place], [place]    Virtual Visit No    Medication changes reported     No    Fall or balance concerns reported    No    Warm-up and Cool-down Performed on first and last piece of equipment    Resistance Training Performed Yes    VAD Patient? No    PAD/SET Patient? No      Pain Assessment   Currently in Pain? No/denies                Social History   Tobacco Use  Smoking Status Never  Smokeless Tobacco Never    Goals Met:  Independence with exercise equipment Exercise tolerated well No report of concerns or symptoms today  Goals Unmet:  Not Applicable  Comments: Pt able to follow exercise prescription today without complaint.  Will continue to monitor for progression.    Dr. Emily Filbert is Medical Director for [place].  Dr. Ottie Glazier is Medical Director for [facility] Pulmonary Rehabilitation.

## 2022-04-23 ENCOUNTER — Ambulatory Visit: Payer: Medicare HMO | Admitting: Dermatology

## 2022-04-24 ENCOUNTER — Encounter: Payer: Medicare HMO | Admitting: *Deleted

## 2022-04-24 DIAGNOSIS — Z952 Presence of prosthetic heart valve: Secondary | ICD-10-CM

## 2022-04-24 NOTE — Progress Notes (Signed)
Daily Session Note  Patient Details  Name: Melody Soto MRN: [id number] Date of Birth: 1950-06-06 Referring Provider:   Flowsheet Row Cardiac Rehab from 02/28/2022 in [facility] Cardiac and Pulmonary Rehab  Referring Provider Lujean Amel MD       Encounter Date: 04/24/2022  Check In:  Session Check In - 04/24/22 1120       Check-In   Location [facility]-Cardiac & Pulmonary Rehab    Staff Present Antionette Fairy, BS, Exercise Physiologist;Salsabeel Gorelick Sherryll Burger, RN BSN;Jessica Luan Pulling, MA, RCEP, CCRP, CCET    Virtual Visit No    Medication changes reported     No    Fall or balance concerns reported    No    Warm-up and Cool-down Performed on first and last piece of equipment    Resistance Training Performed Yes    VAD Patient? No    PAD/SET Patient? No      Pain Assessment   Currently in Pain? No/denies                Social History   Tobacco Use  Smoking Status Never  Smokeless Tobacco Never    Goals Met:  Independence with exercise equipment Exercise tolerated well No report of concerns or symptoms today Strength training completed today  Goals Unmet:  Not Applicable  Comments: Pt able to follow exercise prescription today without complaint.  Will continue to monitor for progression.    Dr. Emily Filbert is Medical Director for [place].  Dr. Ottie Glazier is Medical Director for [facility] Pulmonary Rehabilitation.

## 2022-04-26 ENCOUNTER — Encounter: Payer: Medicare HMO | Admitting: *Deleted

## 2022-04-26 DIAGNOSIS — Z952 Presence of prosthetic heart valve: Secondary | ICD-10-CM | POA: Diagnosis not present

## 2022-04-26 NOTE — Progress Notes (Signed)
Daily Session Note  Patient Details  Name: Melody Soto MRN: [id number] Date of Birth: 1950-09-12 Referring Provider:   Flowsheet Row Cardiac Rehab from 02/28/2022 in [facility] Cardiac and Pulmonary Rehab  Referring Provider Lujean Amel MD       Encounter Date: 04/26/2022  Check In:  Session Check In - 04/26/22 1113       Check-In   Supervising physician immediately available to respond to emergencies See telemetry face sheet for immediately available ER MD    Location [facility]-Cardiac & Pulmonary Rehab    Staff Present Darlyne Russian, RN, ADN;Meredith Sherryll Burger, RN Abel Presto, MS, ACSM CEP, Exercise Physiologist;Joseph Tessie Fass, [place]    Virtual Visit No    Medication changes reported     No    Fall or balance concerns reported    No    Warm-up and Cool-down Performed on first and last piece of equipment    Resistance Training Performed Yes    VAD Patient? No    PAD/SET Patient? No      Pain Assessment   Currently in Pain? No/denies                Social History   Tobacco Use  Smoking Status Never  Smokeless Tobacco Never    Goals Met:  Independence with exercise equipment Exercise tolerated well No report of concerns or symptoms today Strength training completed today  Goals Unmet:  Not Applicable  Comments: Pt able to follow exercise prescription today without complaint.  Will continue to monitor for progression.    Dr. Emily Filbert is Medical Director for [place].  Dr. Ottie Glazier is Medical Director for [facility] Pulmonary Rehabilitation.

## 2022-04-27 ENCOUNTER — Encounter: Payer: Medicare HMO | Admitting: *Deleted

## 2022-04-27 DIAGNOSIS — Z952 Presence of prosthetic heart valve: Secondary | ICD-10-CM | POA: Diagnosis not present

## 2022-04-27 NOTE — Progress Notes (Signed)
Daily Session Note  Patient Details  Name: Melody Soto MRN: [id number] Date of Birth: 08-Aug-1950 Referring Provider:   Flowsheet Row Cardiac Rehab from 02/28/2022 in [facility] Cardiac and Pulmonary Rehab  Referring Provider Lujean Amel MD       Encounter Date: 04/27/2022  Check In:  Session Check In - 04/27/22 1202       Check-In   Supervising physician immediately available to respond to emergencies See telemetry face sheet for immediately available ER MD    Location [facility]-Cardiac & Pulmonary Rehab    Staff Present Heath Lark, RN, BSN, CCRP;Jessica [place]elow, MA, RCEP, CCRP, CCET;Joseph [place], [place]    Virtual Visit No    Medication changes reported     No    Fall or balance concerns reported    No    Warm-up and Cool-down Performed on first and last piece of equipment    Resistance Training Performed Yes    VAD Patient? No    PAD/SET Patient? No      Pain Assessment   Currently in Pain? No/denies                Social History   Tobacco Use  Smoking Status Never  Smokeless Tobacco Never    Goals Met:  Independence with exercise equipment Exercise tolerated well No report of concerns or symptoms today  Goals Unmet:  Not Applicable  Comments: Pt able to follow exercise prescription today without complaint.  Will continue to monitor for progression.    Dr. Emily Filbert is Medical Director for [place].  Dr. Ottie Glazier is Medical Director for [facility] Pulmonary Rehabilitation.

## 2022-05-02 ENCOUNTER — Encounter: Payer: Self-pay | Admitting: *Deleted

## 2022-05-02 DIAGNOSIS — Z952 Presence of prosthetic heart valve: Secondary | ICD-10-CM

## 2022-05-02 NOTE — Progress Notes (Signed)
Cardiac Individual Treatment Plan  Patient Details  Name: Melody Soto MRN: [id number] Date of Birth: 20-Jun-1950 Referring Provider:   Flowsheet Row Cardiac Rehab from 02/28/2022 in [facility] Cardiac and Pulmonary Rehab  Referring Provider Lujean Amel MD       Initial Encounter Date:  Flowsheet Row Cardiac Rehab from 02/28/2022 in [facility] Cardiac and Pulmonary Rehab  Date 02/28/22       Visit Diagnosis: S/P aortic valve replacement  Patient's Home Medications on Admission:  Current Outpatient Medications:    amiodarone (PACERONE) 200 MG tablet, Take 1 tablet (200 mg total) by mouth daily., Disp: 30 tablet, Rfl: 0   Ascorbic Acid (VITAMIN C) 1000 MG tablet, Take 1,000 mg by mouth in the morning and at bedtime., Disp: , Rfl:    aspirin 81 MG chewable tablet, Chew 81 mg by mouth daily., Disp: , Rfl:    atorvastatin (LIPITOR) 40 MG tablet, TAKE 1 TABLET (40 MG TOTAL) BY MOUTH 2 (TWO) TIMES A WEEK., Disp: 24 tablet, Rfl: 1   diltiazem (CARDIZEM CD) 120 MG 24 hr capsule, Take 1 capsule (120 mg total) by mouth daily., Disp: 30 capsule, Rfl: 0   furosemide (LASIX) 40 MG tablet, Take 20 mg by mouth daily., Disp: , Rfl:    JARDIANCE 10 MG TABS tablet, Take 10 mg by mouth daily., Disp: , Rfl:    Magnesium 500 MG CAPS, Take 800 mg by mouth in the morning and at bedtime. , Disp: , Rfl:    metoprolol succinate (TOPROL-XL) 50 MG 24 hr tablet, Take 150 mg by mouth daily., Disp: , Rfl:    Omega-3 Fatty Acids (FISH OIL) 1000 MG CAPS, Take 1 capsule by mouth daily., Disp: , Rfl:    Potassium Chloride ER 20 MEQ TBCR, Take 2 tablets by mouth daily., Disp: , Rfl:    Vitamin D-Vitamin K (VITAMIN K2-VITAMIN D3 PO), Take by mouth in the morning and at bedtime., Disp: , Rfl:    vitamin E 200 UNIT capsule, Take 200 Units by mouth 2 (two) times daily., Disp: , Rfl:    zinc gluconate 50 MG tablet, Take 50 mg by mouth daily., Disp: , Rfl:   Past Medical History: Past Medical History:  Diagnosis Date    Aortic stenosis    Cataract    Depression    Dysrhythmia    Endometrial cancer determined by uterine biopsy (Tib[place]ones) 10/29/2016   Rad tx's and internal brachytherapy.   Heart murmur    HX OF   Hyperlipidemia    Hypertension    CONTROLLED ON MEDS   Lymphedema    Melanoma ([place])    Left upper arm. 20 years ago   Shortness of breath dyspnea     Tobacco Use: Social History   Tobacco Use  Smoking Status Never  Smokeless Tobacco Never    Labs: Review Flowsheet  More data exists      Latest Ref Rng & Units 09/24/2019 12/21/2019 06/21/2020 12/23/2020 07/28/2021  Labs for ITP Cardiac and Pulmonary Rehab  Cholestrol <200 mg/dL - - 199  - 222   LDL (calc) mg/dL (calc) - - 118  - 135   HDL-C > OR = 50 mg/dL - - 59  - 65   Trlycerides <150 mg/dL - - 113  - 112   Hemoglobin A1c <5.7 % of total Hgb - 5.9  5.5  5.5  5.5   TCO2 22 - 32 mmol/L 25  - - - -     Exercise Target Goals: Exercise  Program Goal: Individual exercise prescription set using results from initial 6 min walk test and THRR while considering  patient's activity barriers and safety.   Exercise Prescription Goal: Initial exercise prescription builds to 30-45 minutes a day of aerobic activity, 2-3 days per week.  Home exercise guidelines will be given to patient during program as part of exercise prescription that the participant will acknowledge.   Education: Aerobic Exercise: - Group verbal and visual presentation on the components of exercise prescription. Introduces F.I.T.T principle from ACSM for exercise prescriptions.  Reviews F.I.T.T. principles of aerobic exercise including progression. Written material given at graduation. Flowsheet Row Cardiac Rehab from 04/26/2022 in [facility] Cardiac and Pulmonary Rehab  Education need identified 02/28/22       Education: Resistance Exercise: - Group verbal and visual presentation on the components of exercise prescription. Introduces F.I.T.T principle from ACSM for exercise  prescriptions  Reviews F.I.T.T. principles of resistance exercise including progression. Written material given at graduation.    Education: Exercise & Equipment Safety: - Individual verbal instruction and demonstration of equipment use and safety with use of the equipment. Flowsheet Row Cardiac Rehab from 04/26/2022 in [facility] Cardiac and Pulmonary Rehab  Date 02/28/22  Educator NT  Instruction Review Code 1- Verbalizes Understanding       Education: Exercise Physiology & General Exercise Guidelines: - Group verbal and written instruction with models to review the exercise physiology of the cardiovascular system and associated critical values. Provides general exercise guidelines with specific guidelines to those with heart or lung disease.    Education: Flexibility, Balance, Mind/Body Relaxation: - Group verbal and visual presentation with interactive activity on the components of exercise prescription. Introduces F.I.T.T principle from ACSM for exercise prescriptions. Reviews F.I.T.T. principles of flexibility and balance exercise training including progression. Also discusses the mind body connection.  Reviews various relaxation techniques to help reduce and manage stress (i.e. Deep breathing, progressive muscle relaxation, and visualization). Balance handout provided to take home. Written material given at graduation. Flowsheet Row Cardiac Rehab from 04/26/2022 in [facility] Cardiac and Pulmonary Rehab  Date 03/15/22  Educator [facility]  Instruction Review Code 1- Verbalizes Understanding       Activity Barriers & Risk Stratification:  Activity Barriers & Cardiac Risk Stratification - 02/28/22 1539       Activity Barriers & Cardiac Risk Stratification   Activity Barriers Arthritis;Other (comment)    Comments hip pain, lymphodema in legs   R>L    Cardiac Risk Stratification Low             6 Minute Walk:  6 Minute Walk     Row Name 02/28/22 1538         6 Minute Walk   Phase  Initial     Distance 1040 feet     Walk Time 6 minutes     # of Rest Breaks 0     MPH 1.97     METS 1.93     RPE 11     Perceived Dyspnea  0     VO2 Peak 6.75     Symptoms Yes (comment)     Comments Leg Fatigue     Resting HR 62 bpm     Resting BP 118/70     Resting Oxygen Saturation  95 %     Exercise Oxygen Saturation  during 6 min walk 95 %     Max Ex. HR 93 bpm     Max Ex. BP 150/82     2 Minute  Post BP 124/80              Oxygen Initial Assessment:   Oxygen Re-Evaluation:   Oxygen Discharge (Final Oxygen Re-Evaluation):   Initial Exercise Prescription:  Initial Exercise Prescription - 02/28/22 1500       Date of Initial Exercise RX and Referring Provider   Date 02/28/22    Referring Provider Lujean Amel MD      Oxygen   Maintain Oxygen Saturation 88% or higher      Treadmill   MPH 1    Grade 0    Minutes 15    METs 1.8      NuStep   Level 2    SPM 80    Minutes 15    METs 1.93      Biostep-RELP   Level 1    SPM 60    Minutes 15    METs 1.93      Track   Laps 16    Minutes 15    METs 1.87      Prescription Details   Frequency (times per week) 3    Duration Progress to 30 minutes of continuous aerobic without signs/symptoms of physical distress      Intensity   THRR 40-80% of Max Heartrate 97-132    Ratings of Perceived Exertion 11-13    Perceived Dyspnea 0-4      Progression   Progression Continue to progress workloads to maintain intensity without signs/symptoms of physical distress.      Resistance Training   Training Prescription Yes    Weight 3 lb    Reps 10-15             Perform Capillary Blood Glucose checks as needed.  Exercise Prescription Changes:   Exercise Prescription Changes     Row Name 02/28/22 1500 03/19/22 1300 04/03/22 0800 04/16/22 1500 05/01/22 1300     Response to Exercise   Blood Pressure (Admit) 118/70 118/72 126/66 142/80 122/62   Blood Pressure (Exercise) 150/82 122/64 142/72 -- --    Blood Pressure (Exit) 124/80 100/62 124/76 116/72 112/64   Heart Rate (Admit) 62 bpm 57 bpm 63 bpm 68 bpm 74 bpm   Heart Rate (Exercise) 93 bpm 92 bpm 99 bpm 89 bpm 85 bpm   Heart Rate (Exit) 66 bpm 70 bpm 66 bpm 69 bpm 70 bpm   Oxygen Saturation (Admit) 95 % -- -- -- --   Oxygen Saturation (Exercise) 95 % -- -- -- --   Oxygen Saturation (Exit) 97 % -- -- -- --   Rating of Perceived Exertion (Exercise) 11 13 13 15 13    Perceived Dyspnea (Exercise) 0 -- -- -- --   Symptoms leg fatigue none none none none   Comments 6MWT Results 2nd full week of exercise -- -- --   Duration -- Progress to 30 minutes of  aerobic without signs/symptoms of physical distress Progress to 30 minutes of  aerobic without signs/symptoms of physical distress Continue with 30 min of aerobic exercise without signs/symptoms of physical distress. Continue with 30 min of aerobic exercise without signs/symptoms of physical distress.   Intensity -- THRR unchanged THRR unchanged THRR unchanged THRR unchanged     Progression   Progression -- Continue to progress workloads to maintain intensity without signs/symptoms of physical distress. Continue to progress workloads to maintain intensity without signs/symptoms of physical distress. Continue to progress workloads to maintain intensity without signs/symptoms of physical distress. Continue to progress workloads to maintain intensity without  signs/symptoms of physical distress.   Average METs -- 2.33 2.35 3.68 3.37     Resistance Training   Training Prescription -- Yes Yes Yes Yes   Weight -- 3 lb 3 lb 3 lb 3 lb   Reps -- 10-15 10-15 10-15 10-15     Interval Training   Interval Training -- No No No No     Treadmill   MPH -- 1 -- -- --   Grade -- 0 -- -- --   Minutes -- 15 -- -- --   METs -- 1.8 -- -- --     NuStep   Level -- 3 3 -- 3   Minutes -- 15 30 -- 15   METs -- 2.8 2.9 -- 2.7     REL-XR   Level -- -- -- 4 4   Minutes -- -- -- 30 30   METs -- -- -- 4 4.3      Biostep-RELP   Level -- 3 3 2 2    Minutes -- 15 15 15 15    METs -- 3 2 3 3      Oxygen   Maintain Oxygen Saturation -- -- 88% or higher 88% or higher 88% or higher            Exercise Comments:   Exercise Comments     Row Name 02/28/22 0931 03/06/22 1131         Exercise Comments Virtual orientation call completed today. shehas an appointment on Date: 02/28/22 for EP eval and gym Orientation. Documentation of diagnosis can be found in [facility] Date: 12/07/2021 . 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 First full day of exercise!  Patient was oriented to gym and equipment including functions, settings, policies, and procedures.  Patient's individual exercise prescription and treatment plan were reviewed.  All starting workloads were established based on the results of the 6 minute walk test done at initial orientation visit.  The plan for exercise progression was also introduced and progression will be customized based on patient's performance and goals.               Exercise Goals and Review:   Exercise Goals     Row Name 02/28/22 1540             Exercise Goals   Increase Physical Activity Yes       Intervention Provide advice, education, support and counseling about physical activity/exercise needs.;Develop an individualized exercise prescription for aerobic and resistive training based on initial evaluation findings, risk stratification, comorbidities and participant's personal goals.       Expected Outcomes Short Term: Attend rehab on a regular basis to increase amount of physical activity.;Long Term: Exercising regularly at least 3-5 days a week.;Long Term: Add in home exercise to make exercise part of routine and to increase amount of physical activity.       Increase Strength and Stamina Yes       Intervention Provide advice, education, support and counseling about physical  activity/exercise needs.;Develop an individualized exercise prescription for aerobic and resistive training based on initial evaluation findings, risk stratification, comorbidities and participant's personal goals.       Expected Outcomes Short Term: Perform resistance training exercises routinely during rehab and add in resistance training at home;Short Term: Increase workloads from initial exercise prescription for resistance, speed, and METs.;Long Term: Improve cardiorespiratory fitness, muscular endurance and strength as measured by increased METs  and functional capacity (6MWT)       Able to understand and use rate of perceived exertion (RPE) scale Yes       Intervention Provide education and explanation on how to use RPE scale       Expected Outcomes Short Term: Able to use RPE daily in rehab to express subjective intensity level;Long Term:  Able to use RPE to guide intensity level when exercising independently       Able to understand and use Dyspnea scale Yes       Intervention Provide education and explanation on how to use Dyspnea scale       Expected Outcomes Short Term: Able to use Dyspnea scale daily in rehab to express subjective sense of shortness of breath during exertion;Long Term: Able to use Dyspnea scale to guide intensity level when exercising independently       Knowledge and understanding of Target Heart Rate Range (THRR) Yes       Intervention Provide education and explanation of THRR including how the numbers were predicted and where they are located for reference       Expected Outcomes Short Term: Able to state/look up THRR;Long Term: Able to use THRR to govern intensity when exercising independently;Short Term: Able to use daily as guideline for intensity in rehab       Able to check pulse independently Yes       Intervention Provide education and demonstration on how to check pulse in carotid and radial arteries.;Review the importance of being able to check your own pulse for  safety during independent exercise       Expected Outcomes Short Term: Able to explain why pulse checking is important during independent exercise;Long Term: Able to check pulse independently and accurately       Understanding of Exercise Prescription Yes       Intervention Provide education, explanation, and written materials on patient's individual exercise prescription       Expected Outcomes Short Term: Able to explain program exercise prescription;Long Term: Able to explain home exercise prescription to exercise independently                Exercise Goals Re-Evaluation :  Exercise Goals Re-Evaluation     Row Name 03/06/22 1131 03/19/22 1328 04/03/22 0841 04/12/22 1133 04/16/22 1504     Exercise Goal Re-Evaluation   Exercise Goals Review Increase Physical Activity;Able to understand and use rate of perceived exertion (RPE) scale;Knowledge and understanding of Target Heart Rate Range (THRR);Understanding of Exercise Prescription;Increase Strength and Stamina;Able to check pulse independently Increase Physical Activity;Understanding of Exercise Prescription;Increase Strength and Stamina Increase Physical Activity;Understanding of Exercise Prescription;Increase Strength and Stamina Increase Physical Activity;Understanding of Exercise Prescription;Increase Strength and Stamina Increase Physical Activity;Understanding of Exercise Prescription;Increase Strength and Stamina   Comments Reviewed RPE scale, THR and program prescription with pt today.  Pt voiced understanding and was given a copy of goals to take home. Jackelyn Poling is doing well for the first couple of weeks she has been at rehab. She has exercised at her initial exercise prescription well, working at levels 3 on both the T4 Nustep and Biostep. She walked 1 time at  a 1.0 mph on the treadmill and will encourage her to walk more as we further get into the program. Her RPEs are in appropriate range. Will continue to monitor. Jackelyn Poling continues to  do well in rehab. She recently increased her overall average MET level to 2.35 METs. She also has continued to do well  on seated machines at level 3 on both the T4 Nustep and the biostep. She has not done any walking since the last review. We will continue to monitor her progress in the program. [name] reports stretching and doing balance work sometimes. Discussed importance of exercise outside of rehab including aerobic and resistance exercise. She has not met with EP to discuss home exercise yet. Jackelyn Poling continues to do well in rehab. She is very limited with walking and has not participated in it yet due to some hip pain and lymphedema in her legs.She is doing well on seated machines working at an increase to level 4 on the XR. She could benefit from increasing to level 3 on the Biostep and moving up to 4 lbs for handweights. Will continue to monitor.   Expected Outcomes Short: Use RPE daily to regulate intensity.  Long: Follow program prescription in THR. Short: Follow exercise prescription, push to walk the treadmill more often Long: Build up overall strength and stamina Short: Return to walking the treadmill. Long: Continue to improve strength and stamina. Short: EP to go over home exercise Long: Continue to improve strength and stamina. Short: Increase to level 3 on Biostep, increase tp 4 lb handweights Long: continue to increase overall MET level and stamina    Row Name 05/01/22 1327             Exercise Goal Re-Evaluation   Exercise Goals Review Increase Physical Activity;Understanding of Exercise Prescription;Increase Strength and Stamina       Comments Jackelyn Poling is doing well in rehab. She has continued to work at an average overall MET level above 3.3 METs. She also has stayed consistent with her workloads at level 2 on the biostep, level 3 on the T4 nustep, and level 4 on the XR. She has not done any walking on the track or treadmill since the last review. She also has continued to use 3 lb hand  weights for resistance training. We will continue to monitor her progress in the program.       Expected Outcomes Short: Progressively increase workloads on seated machines, return to walking. Long: Continue to improve strength and stamina.                Discharge Exercise Prescription (Final Exercise Prescription Changes):  Exercise Prescription Changes - 05/01/22 1300       Response to Exercise   Blood Pressure (Admit) 122/62    Blood Pressure (Exit) 112/64    Heart Rate (Admit) 74 bpm    Heart Rate (Exercise) 85 bpm    Heart Rate (Exit) 70 bpm    Rating of Perceived Exertion (Exercise) 13    Symptoms none    Duration Continue with 30 min of aerobic exercise without signs/symptoms of physical distress.    Intensity THRR unchanged      Progression   Progression Continue to progress workloads to maintain intensity without signs/symptoms of physical distress.    Average METs 3.37      Resistance Training   Training Prescription Yes    Weight 3 lb    Reps 10-15      Interval Training   Interval Training No      NuStep   Level 3    Minutes 15    METs 2.7      REL-XR   Level 4    Minutes 30    METs 4.3      Biostep-RELP   Level 2    Minutes 15  METs 3      Oxygen   Maintain Oxygen Saturation 88% or higher             Nutrition:  Target Goals: Understanding of nutrition guidelines, daily intake of sodium 1500mg , cholesterol 200mg , calories 30% from fat and 7% or less from saturated fats, daily to have 5 or more servings of fruits and vegetables.  Education: All About Nutrition: -Group instruction provided by verbal, written material, interactive activities, discussions, models, and posters to present general guidelines for heart healthy nutrition including fat, fiber, MyPlate, the role of sodium in heart healthy nutrition, utilization of the nutrition label, and utilization of this knowledge for meal planning. Follow up email sent as well. Written  material given at graduation.   Biometrics:  Pre Biometrics - 02/28/22 1540       Pre Biometrics   Height 5' 9.5" (1.765 m)    Weight 219 lb 4.8 oz (99.5 kg)    Waist Circumference 36.5 inches    Hip Circumference 46 inches    Waist to Hip Ratio 0.79 %    BMI (Calculated) 31.93    Single Leg Stand 9.3 seconds   R             Nutrition Therapy Plan and Nutrition Goals:  Nutrition Therapy & Goals - 04/12/22 1203       Personal Nutrition Goals   Comments Jackelyn Poling reports that 3 years ago she was doing the keto diet and lost 60-75 lbs, but has since regained that weight. She reports intermittent fasting and eating what she wants at this time such as fried fish and chicken as her tastes have been altered since being in the hospital; [name] reports feeling like she can still eat a lot of food at one time, after rehab is craving doughnuts and fast food on the way home, and she has trouble with snacking at night - discussed how this could be due to her eating pattern and feelings of hunger, suggested even a snack before exercise like apple with peanut butter and eating satiating meals/snacks consistently during the day can help to curb this. [name] scheduled nutrition appt. with RD 3/13 at 130pm.             Nutrition Assessments:  MEDIFICTS Score Key: ?70 Need to make dietary changes  40-70 Heart Healthy Diet ? 40 Therapeutic Level Cholesterol Diet  Flowsheet Row Cardiac Rehab from 02/28/2022 in [facility] Cardiac and Pulmonary Rehab  Picture Your Plate Total Score on Admission 57      Picture Your Plate Scores: [id number] Unhealthy dietary pattern with much room for improvement. 41-50 Dietary pattern unlikely to meet recommendations for good health and room for improvement. 51-60 More healthful dietary pattern, with some room for improvement.  >60 Healthy dietary pattern, although there may be some specific behaviors that could be improved.    Nutrition Goals Re-Evaluation:   Nutrition Goals Re-Evaluation     [place] Name 03/22/22 1118 04/12/22 1156           Goals   Comment Patient deferrs dietitian appointment. Will follow up with patient. [name] reports that 3 years ago she was doing the keto diet and lost 60-75 lbs, but has since regained that weight. She reports intermittent fasting and eating what she wants at this time such as fried fish and chicken as her tastes have been altered since being in the hospital; [name] reports feeling like she can still eat a lot of food at one time, after  rehab is craving doughnuts and fast food on the way home, and she has trouble with snacking at night - discussed how this could be due to her eating pattern and feelings of hunger, suggested even a snack before exercise like apple with peanut butter and eating satiating meals/snacks consistently during the day can help to curb this. [name] scheduled nutrition appt. with RD 3/13 at 130pm.               Nutrition Goals Discharge (Final Nutrition Goals Re-Evaluation):  Nutrition Goals Re-Evaluation - 04/12/22 1156       Goals   Comment [name] reports that 3 years ago she was doing the keto diet and lost 60-75 lbs, but has since regained that weight. She reports intermittent fasting and eating what she wants at this time such as fried fish and chicken as her tastes have been altered since being in the hospital; [name] reports feeling like she can still eat a lot of food at one time, after rehab is craving doughnuts and fast food on the way home, and she has trouble with snacking at night - discussed how this could be due to her eating pattern and feelings of hunger, suggested even a snack before exercise like apple with peanut butter and eating satiating meals/snacks consistently during the day can help to curb this. [name] scheduled nutrition appt. with RD 3/13 at 130pm.             Psychosocial: Target Goals: Acknowledge presence or absence of significant depression and/or  stress, maximize coping skills, provide positive support system. Participant is able to verbalize types and ability to use techniques and skills needed for reducing stress and depression.   Education: Stress, Anxiety, and Depression - Group verbal and visual presentation to define topics covered.  Reviews how body is impacted by stress, anxiety, and depression.  Also discusses healthy ways to reduce stress and to treat/manage anxiety and depression.  Written material given at graduation. Flowsheet Row Cardiac Rehab from 04/26/2022 in [facility] Cardiac and Pulmonary Rehab  Date 04/26/22  Educator [facility]  Instruction Review Code 1- [employer] Understanding       Education: Sleep Hygiene -Provides group verbal and written instruction about how sleep can affect your health.  Define sleep hygiene, discuss sleep cycles and impact of sleep habits. Review good sleep hygiene tips.    Initial Review & Psychosocial Screening:  Initial Psych Review & Screening - 02/28/22 0916       Initial Review   Current issues with None Identified      Family Dynamics   Good Support System? Yes   friends, daughter     Barriers   Psychosocial barriers to participate in program There are no identifiable barriers or psychosocial needs.      Screening Interventions   Interventions Encouraged to exercise;To provide support and resources with identified psychosocial needs;Provide feedback about the scores to participant    Expected Outcomes Short Term goal: Utilizing psychosocial counselor, staff and physician to assist with identification of specific Stressors or current issues interfering with healing process. Setting desired goal for each stressor or current issue identified.;Long Term Goal: Stressors or current issues are controlled or eliminated.;Short Term goal: Identification and review with participant of any Quality of Life or Depression concerns found by scoring the questionnaire.;Long Term goal: The participant  improves quality of Life and PHQ9 Scores as seen by post scores and/or verbalization of changes  Quality of Life Scores:   Quality of Life - 02/28/22 1158       Quality of Life   Select Quality of Life      Quality of Life Scores   Health/Function Pre 20.6 %    Socioeconomic Pre 22.31 %    Psych/Spiritual Pre 28.93 %    Family Pre 23.4 %    GLOBAL Pre 23.09 %            Scores of 19 and below usually indicate a poorer quality of life in these areas.  A difference of  2-3 points is a clinically meaningful difference.  A difference of 2-3 points in the total score of the Quality of Life Index has been associated with significant improvement in overall quality of life, self-image, physical symptoms, and general health in studies assessing change in quality of life.  PHQ-9: Review Flowsheet  More data exists      02/28/2022 02/23/2022 01/31/2022 12/01/2021 09/12/2021  Depression screen PHQ 2/9  Decreased Interest 0 0 0 1 0  Down, Depressed, Hopeless 0 0 0 0 0  PHQ - 2 Score 0 0 0 1 0  Altered sleeping 0 0 0 0 0  Tired, decreased energy 0 0 0 3 0  Change in appetite 0 0 0 0 0  Feeling bad or failure about yourself  0 0 0 0 0  Trouble concentrating 0 0 0 0 0  Moving slowly or fidgety/restless 0 0 0 0 0  Suicidal thoughts 0 0 0 0 0  PHQ-9 Score 0 0 0 4 0  Difficult doing work/chores Not difficult at all - - - -   Interpretation of Total Score  Total Score Depression Severity:  1-4 = Minimal depression, 5-9 = Mild depression, 10-14 = Moderate depression, 15-19 = Moderately severe depression, 20-27 = Severe depression   Psychosocial Evaluation and Intervention:  Psychosocial Evaluation - 02/28/22 0932       Psychosocial Evaluation & Interventions   Interventions Encouraged to exercise with the program and follow exercise prescription    Comments Jackelyn Poling has no barriers to starting the program.  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  She has a great support system, friends and her daughter.  [name] stayed at her friends home for 3 weeks after discharge. She has completed her PT OT at home and is ready to continue working on gaining strength and stamina back.  She lives alone and has one cat at home. She is a retired Pharmacist, hospital.    Expected Outcomes STG Jackelyn Poling is able to attend all scheduled sessions and progress with her exercise prescription LTG Jackelyn Poling continues with her exercise progression after discharge    Continue Psychosocial Services  Follow up required by staff             Psychosocial Re-Evaluation:  Psychosocial Re-Evaluation     [place] Name 03/22/22 1120 04/12/22 1138           Psychosocial Re-Evaluation   Current issues with None Identified None Identified      Comments Patient reports no issues with their current mental states, sleep, stress, depression or anxiety. Will follow up with patient in a few weeks for any changes. 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 She relies on her bible group, friends, and daughters for support. [name] reports that she has been sleeping well recently and is unsure why, suggested that if its been about  a month that it has improved, it could very likely be from coming to rehab and exercising.      Expected Outcomes Short: Continue to exercise regularly to support mental health and notify staff of any changes. Long: maintain mental health and well being through teaching of rehab or prescribed medications independently. Short: Continue to exercise regularly to support mental health and notify staff of any changes. Long: maintain mental health and well being through teaching of rehab or prescribed medications independently.      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 She relies on her bible group, friends, and daughters for support. [name] reports that she has been sleeping well recently and is unsure why, suggested that if its been about a month that it has improved, it could very likely be from coming to rehab and exercising.    Expected Outcomes Short: Continue to exercise regularly to support mental health and notify staff of any changes. Long: maintain mental health and well being through teaching of rehab or prescribed medications independently.    Interventions Encouraged to attend Cardiac Rehabilitation for the exercise    Continue Psychosocial Services  Follow up required by staff             Vocational Rehabilitation: Provide vocational rehab assistance to qualifying candidates.   Vocational Rehab Evaluation & Intervention:  Vocational Rehab - 02/28/22 0919       Initial Vocational Rehab Evaluation & Intervention   Assessment shows need for Vocational Rehabilitation No      Vocational Rehab Re-Evaulation   Comments retired             Education: Education Goals: Education classes will be provided on a variety of topics geared toward better understanding of heart health and risk factor modification. Participant will state understanding/return demonstration of topics presented as noted by education test scores.  Learning Barriers/Preferences:  Learning Barriers/Preferences - 02/28/22 0919       Learning Barriers/Preferences   Learning Barriers None    Learning Preferences Audio             General Cardiac Education Topics:  AED/CPR: - Group verbal and written instruction with the use of models to demonstrate the basic use  of the AED with the basic ABC's of resuscitation.   Anatomy and Cardiac Procedures: - Group verbal and visual presentation and models provide information about basic cardiac anatomy and function. Reviews the testing methods done to diagnose heart disease and the outcomes of the test results. Describes the treatment choices: Medical Management, Angioplasty, or Coronary Bypass Surgery for treating various heart conditions including Myocardial Infarction, Angina, Valve Disease, and Cardiac Arrhythmias.  Written material given at graduation. Flowsheet Row Cardiac Rehab from 04/26/2022 in [facility] Cardiac and Pulmonary Rehab  Date 03/22/22  Educator SB  Instruction Review Code 1- Verbalizes Understanding       Medication Safety: - Group verbal and visual instruction to review commonly prescribed medications for heart and lung disease. Reviews the  medication, class of the drug, and side effects. Includes the steps to properly store meds and maintain the prescription regimen.  Written material given at graduation.   Intimacy: - Group verbal instruction through game format to discuss how heart and lung disease can affect sexual intimacy. Written material given at graduation..   Know Your Numbers and Heart Failure: - Group verbal and visual instruction to discuss disease risk factors for cardiac and pulmonary disease and treatment options.  Reviews associated critical values for Overweight/Obesity, Hypertension, Cholesterol, and Diabetes.  Discusses basics of heart failure: signs/symptoms and treatments.  Introduces Heart Failure Zone chart for action plan for heart failure.  Written material given at graduation. Flowsheet Row Cardiac Rehab from 04/26/2022 in [facility] Cardiac and Pulmonary Rehab  Date 04/12/22  Educator [facility]  Instruction Review Code 1- Verbalizes Understanding       Infection Prevention: - Provides verbal and written material to individual with discussion of infection control including  proper hand washing and proper equipment cleaning during exercise session. Flowsheet Row Cardiac Rehab from 04/26/2022 in [facility] Cardiac and Pulmonary Rehab  Date 02/28/22  Educator NT  Instruction Review Code 1- Verbalizes Understanding       Falls Prevention: - Provides verbal and written material to individual with discussion of falls prevention and safety. Flowsheet Row Cardiac Rehab from 04/26/2022 in [facility] Cardiac and Pulmonary Rehab  Date 02/28/22  Educator SB  Instruction Review Code 1- Verbalizes Understanding       Other: -Provides group and verbal instruction on various topics (see comments)   Knowledge Questionnaire Score:  Knowledge Questionnaire Score - 02/28/22 1159       Knowledge Questionnaire Score   Pre Score 25/26   exercise             Core Components/Risk Factors/Patient Goals at Admission:  Personal Goals and Risk Factors at Admission - 02/28/22 1532       Core Components/Risk Factors/Patient Goals on Admission    Weight Management Yes    Intervention Weight Management: Develop a combined nutrition and exercise program designed to reach desired caloric intake, while maintaining appropriate intake of nutrient and fiber, sodium and fats, and appropriate energy expenditure required for the weight goal.;Weight Management: Provide education and appropriate resources to help participant work on and attain dietary goals.    Admit Weight 219 lb 4.8 oz (99.5 kg)    Goal Weight: Short Term 215 lb (97.5 kg)    Goal Weight: Long Term 210 lb (95.3 kg)    Expected Outcomes Short Term: Continue to assess and modify interventions until short term weight is achieved;Long Term: Adherence to nutrition and physical activity/exercise program aimed toward attainment of established weight goal;Weight Maintenance: Understanding of the daily nutrition guidelines, which includes 25-35% calories from fat, 7% or less cal from saturated fats, less than 200mg  cholesterol, less than  1.5gm of sodium, & 5 or more servings of fruits and vegetables daily    Hypertension Yes    Intervention Provide education on lifestyle modifcations including regular physical activity/exercise, weight management, moderate sodium restriction and increased consumption of fresh fruit, vegetables, and low fat dairy, alcohol moderation, and smoking cessation.;Monitor prescription use compliance.    Expected Outcomes Short Term: Continued assessment and intervention until BP is < 140/48mm HG in hypertensive participants. < 130/23mm HG in hypertensive participants with diabetes, heart failure or chronic kidney disease.;Long Term: Maintenance of blood pressure at goal levels.    Lipids Yes    Intervention Provide education and support for  participant on nutrition & aerobic/resistive exercise along with prescribed medications to achieve LDL 70mg , HDL >40mg .    Expected Outcomes Short Term: Participant states understanding of desired cholesterol values and is compliant with medications prescribed. Participant is following exercise prescription and nutrition guidelines.;Long Term: Cholesterol controlled with medications as prescribed, with individualized exercise RX and with personalized nutrition plan. Value goals: LDL < 70mg , HDL > 40 mg.             Education:Diabetes - Individual verbal and written instruction to review signs/symptoms of diabetes, desired ranges of glucose level fasting, after meals and with exercise. Acknowledge that pre and post exercise glucose checks will be done for 3 sessions at entry of program.   Core Components/Risk Factors/Patient Goals Review:   Goals and Risk Factor Review     Row Name 03/22/22 1114 04/12/22 1114           Core Components/Risk Factors/Patient Goals Review   Personal Goals Review Weight Management/Obesity Weight Management/Obesity;Lipids;Hypertension      Review Deb wants to reach a weight goal of under 200 pounds. She feels like her appetite has  increased since she has some energy back. Her doctor took her off lasix for a moment but it was not working out for her due to weight increase that was most likely fluid. [name] reports doing well in rehab and feels better when she exercises, but she doesn't always want to come. She made an appointment with RD  to discuss nutrition. [name] reports that she weighs herself daily to check on fluid retention, however, she is not checking her BP at this time; encourgaed to check BP at least 2x/week when not at rehab. [name] reports having difficulty starting to make changes; discussed how habits can be easier to add in when paired with other habits.      Expected Outcomes Short: lose a few pounds in a couple weeks. Long: reach weight goal of under 200 pounds. ST: check BP at least 2x/week when not at rehab, meet with RD LT: monitor risk factors independently               Core Components/Risk Factors/Patient Goals at Discharge (Final Review):   Goals and Risk Factor Review - 04/12/22 1114       Core Components/Risk Factors/Patient Goals Review   Personal Goals Review Weight Management/Obesity;Lipids;Hypertension    Review Jackelyn Poling reports doing well in rehab and feels better when she exercises, but she doesn't always want to come. She made an appointment with RD  to discuss nutrition. [name] reports that she weighs herself daily to check on fluid retention, however, she is not checking her BP at this time; encourgaed to check BP at least 2x/week when not at rehab. [name] reports having difficulty starting to make changes; discussed how habits can be easier to add in when paired with other habits.    Expected Outcomes ST: check BP at least 2x/week when not at rehab, meet with RD LT: monitor risk factors independently             ITP Comments:  ITP Comments     Row Name 02/28/22 0930 02/28/22 1531 03/06/22 1131 03/07/22 0958 04/04/22 1428   ITP Comments Virtual orientation call completed today.  shehas an appointment on Date: 02/28/22 for EP eval and gym Orientation.  Documentation of diagnosis can be found in [facility] Date: 12/07/2021 .     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 Completed 6MWT and gym orientation. Initial ITP created and sent for review to Dr. Emily Filbert, Medical Director. First full day of exercise!  Patient was oriented to gym and equipment including functions, settings, policies, and procedures.  Patient's individual exercise prescription and treatment plan were reviewed.  All starting workloads were established based on the results of the 6 minute walk test done at initial orientation visit.  The plan for exercise progression was also introduced and progression will be customized based on patient's performance and goals. 30 Day review completed. Medical Director ITP review done, changes made as directed, and signed approval by Medical Director.   new to program 30 day review completed. ITP sent to Dr. Emily Filbert, Medical Director of Cardiac Rehab. Continue with ITP unless changes are made by physician.    [place] Name 05/02/22 0916           ITP Comments 30 Day review completed. Medical Director ITP review done, changes made as directed, and signed approval by Medical Director.                Comments:

## 2022-05-03 ENCOUNTER — Encounter: Payer: Medicare HMO | Admitting: *Deleted

## 2022-05-03 DIAGNOSIS — Z952 Presence of prosthetic heart valve: Secondary | ICD-10-CM

## 2022-05-03 NOTE — Progress Notes (Signed)
Daily Session Note  Patient Details  Name: Melody Soto MRN: [id number] Date of Birth: 1950-06-24 Referring Provider:   Flowsheet Row Cardiac Rehab from 02/28/2022 in [facility] Cardiac and Pulmonary Rehab  Referring Provider Lujean Amel MD       Encounter Date: 05/03/2022  Check In:  Session Check In - 05/03/22 1112       Check-In   Supervising physician immediately available to respond to emergencies See telemetry face sheet for immediately available ER MD    Location [facility]-Cardiac & Pulmonary Rehab    Staff Present Larna Daughters, MS, ACSM CEP, Exercise Physiologist;Joseph [place], RCP,RRT,BSRT;Novalyn Lajara Frederico Hamman RN, Odelia Gage, RN, ADN    Virtual Visit No    Medication changes reported     No    Fall or balance concerns reported    No    Tobacco Cessation No Change    Warm-up and Cool-down Performed on first and last piece of equipment    Resistance Training Performed Yes    VAD Patient? No    PAD/SET Patient? No      Pain Assessment   Currently in Pain? No/denies                Social History   Tobacco Use  Smoking Status Never  Smokeless Tobacco Never    Goals Met:  Independence with exercise equipment Exercise tolerated well No report of concerns or symptoms today Strength training completed today  Goals Unmet:  Not Applicable  Comments: Pt able to follow exercise prescription today without complaint.  Will continue to monitor for progression.    Dr. Emily Filbert is Medical Director for [place].  Dr. Ottie Glazier is Medical Director for [facility] Pulmonary Rehabilitation.

## 2022-05-04 ENCOUNTER — Encounter: Payer: Medicare HMO | Admitting: *Deleted

## 2022-05-04 DIAGNOSIS — Z952 Presence of prosthetic heart valve: Secondary | ICD-10-CM

## 2022-05-04 NOTE — Progress Notes (Signed)
Daily Session Note  Patient Details  Name: Melody Soto MRN: [id number] Date of Birth: Jan 16, 1951 Referring Provider:   Flowsheet Row Cardiac Rehab from 02/28/2022 in [facility] Cardiac and Pulmonary Rehab  Referring Provider Lujean Amel MD       Encounter Date: 05/04/2022  Check In:  Session Check In - 05/04/22 1151       Check-In   Supervising physician immediately available to respond to emergencies See telemetry face sheet for immediately available ER MD    Location [facility]-Cardiac & Pulmonary Rehab    Staff Present Heath Lark, RN, BSN, CCRP;Jessica Spreckels, MA, RCEP, CCRP, CCET;Joseph [place], [place]    Virtual Visit No    Medication changes reported     No    Fall or balance concerns reported    No    Warm-up and Cool-down Performed on first and last piece of equipment    Resistance Training Performed Yes    VAD Patient? No    PAD/SET Patient? No      Pain Assessment   Currently in Pain? No/denies                Social History   Tobacco Use  Smoking Status Never  Smokeless Tobacco Never    Goals Met:  Independence with exercise equipment Exercise tolerated well No report of concerns or symptoms today  Goals Unmet:  Not Applicable  Comments: Pt able to follow exercise prescription today without complaint.  Will continue to monitor for progression.    Dr. Emily Filbert is Medical Director for [place].  Dr. Ottie Glazier is Medical Director for [facility] Pulmonary Rehabilitation.

## 2022-05-08 ENCOUNTER — Encounter: Payer: Medicare HMO | Admitting: *Deleted

## 2022-05-08 ENCOUNTER — Ambulatory Visit (INDEPENDENT_AMBULATORY_CARE_PROVIDER_SITE_OTHER): Payer: Medicare HMO | Admitting: Dermatology

## 2022-05-08 ENCOUNTER — Encounter: Payer: Self-pay | Admitting: Dermatology

## 2022-05-08 VITALS — BP 138/73 | HR 65

## 2022-05-08 DIAGNOSIS — L821 Other seborrheic keratosis: Secondary | ICD-10-CM | POA: Diagnosis not present

## 2022-05-08 DIAGNOSIS — L820 Inflamed seborrheic keratosis: Secondary | ICD-10-CM | POA: Diagnosis not present

## 2022-05-08 DIAGNOSIS — B078 Other viral warts: Secondary | ICD-10-CM | POA: Diagnosis not present

## 2022-05-08 DIAGNOSIS — Z952 Presence of prosthetic heart valve: Secondary | ICD-10-CM | POA: Diagnosis not present

## 2022-05-08 DIAGNOSIS — L578 Other skin changes due to chronic exposure to nonionizing radiation: Secondary | ICD-10-CM

## 2022-05-08 NOTE — Progress Notes (Signed)
Daily Session Note  Patient Details  Name: Melody Soto MRN: [id number] Date of Birth: [birth year] Referring Provider:   Flowsheet Row Cardiac Rehab from 02/28/2022 in [facility] Cardiac and Pulmonary Rehab  Referring Provider Lujean Amel MD       Encounter Date: 05/08/2022  Check In:  Session Check In - 05/08/22 1150       Check-In   Supervising physician immediately available to respond to emergencies See telemetry face sheet for immediately available ER MD    Location [facility]-Cardiac & Pulmonary Rehab    Staff Present Heath Lark, RN, BSN, CCRP;Jessica [place], MA, RCEP, CCRP, Bertram Gala, MS, ACSM CEP, Exercise Physiologist;Noah Tickle, BS, Exercise Physiologist    Virtual Visit No    Medication changes reported     No    Fall or balance concerns reported    No    Warm-up and Cool-down Performed on first and last piece of equipment    Resistance Training Performed Yes    VAD Patient? No    PAD/SET Patient? No      Pain Assessment   Currently in Pain? No/denies                Social History   Tobacco Use  Smoking Status Never  Smokeless Tobacco Never    Goals Met:  Proper associated with RPD/PD & O2 Sat Exercise tolerated well No report of concerns or symptoms today  Goals Unmet:  Not Applicable  Comments: Pt able to follow exercise prescription today without complaint.  Will continue to monitor for progression.    Dr. Emily Filbert is Medical Director for [place].  Dr. Ottie Glazier is Medical Director for [facility] Pulmonary Rehabilitation.

## 2022-05-08 NOTE — Progress Notes (Signed)
Follow-Up Visit   Subjective  Melody Soto is a 72 y.o. female who presents for the following: 2 month follow-up. Patient has wart treated on the left plantar foot. She thinks this area is clear. She also had AK treated on the right lower lip and ISKs on the left forearm, left post flank at bra line, and left thenar hand. ISK on the left forearm had no change after cryotherapy treatment- still pink but not scaly. She has an irritated spot on her left forehead.    The following portions of the chart were reviewed this encounter and updated as appropriate: medications, allergies, medical history  Review of Systems:  No other skin or systemic complaints except as noted in HPI or Assessment and Plan.  Objective  Well appearing patient in no apparent distress; mood and affect are within normal limits.  A focused examination was performed of the following areas: face, flank, left hand, left arm.   Relevant exam findings are noted in the Assessment and Plan.    Assessment & Plan   WART Exam: small residual firm depressed papule  Discussed viral / HPV (Human Papilloma Virus) etiology and risk of spread /infectivity to other areas of body as well as to other people.  Multiple treatments and methods may be required to clear warts and it is possible treatment may not be successful.  Treatment risks include discoloration; scarring and there is still potential for wart recurrence.  Treatment Plan: Destruction Procedure Note Destruction method: cryotherapy   Informed consent: discussed and consent obtained   Lesion destroyed using liquid nitrogen: Yes   Outcome: patient tolerated procedure well with no complications   Post-procedure details: wound care instructions given   Locations: left plantar foot # of Lesions Treated: 1  Prior to procedure, discussed risks of blister formation, small wound, skin dyspigmentation, or rare scar following cryotherapy. Recommend Vaseline ointment to  treated areas while healing.  SEBORRHEIC KERATOSIS - Stuck-on, waxy, tan-brown papules and/or plaques  - Benign-appearing - Discussed benign etiology and prognosis. - Observe - Call for any changes  INFLAMED SEBORRHEIC KERATOSIS Exam: Pink patch on the left forearm- scaly at proximal edge; waxy erythematous papule on the forehead. Symptomatic, irritating, patient would like treated.  Benign-appearing.  Call clinic for new or changing lesions.   Prior to procedure, discussed risks of blister formation, small wound, skin dyspigmentation, or rare scar following treatment. Recommend Vaseline ointment to treated areas while healing.  Destruction Procedure Note Destruction method: cryotherapy   Informed consent: discussed and consent obtained   Lesion destroyed using liquid nitrogen: Yes   Outcome: patient tolerated procedure well with no complications   Post-procedure details: wound care instructions given   Locations: left forearm x 1 (residual), left forehead x 1 # of Lesions Treated: 2    Actinic Damage - chronic, secondary to cumulative UV radiation exposure/sun exposure over time - diffuse scaly erythematous macules with underlying dyspigmentation - Recommend daily broad spectrum sunscreen SPF 30+ to sun-exposed areas, reapply every 2 hours as needed.  - Recommend staying in the shade or wearing long sleeves, sun glasses (UVA+UVB protection) and wide brim hats (4-inch brim around the entire circumference of the hat). - Call for new or changing lesions.   Return as scheduled, for TBSE.  IJamesetta Orleans, CMA, am acting as scribe for Brendolyn Patty, MD .   Documentation: I have reviewed the above documentation for accuracy and completeness, and I agree with the above.  Brendolyn Patty, MD

## 2022-05-08 NOTE — Patient Instructions (Addendum)
Cryotherapy Aftercare  Wash gently with soap and water everyday.   Apply Vaseline and Band-Aid daily until healed.    Gentle Skin Care Guide  1. Bathe no more than once a day.  2. Avoid bathing in hot water  3. Use a mild soap like Dove, Vanicream, Cetaphil, CeraVe. Can use Lever 2000 or Cetaphil antibacterial soap  4. Use soap only where you need it. On most days, use it under your arms, between your legs, and on your feet. Let the water rinse other areas unless visibly dirty.  5. When you get out of the bath/shower, use a towel to gently blot your skin dry, don't rub it.  6. While your skin is still a little damp, apply a moisturizing cream such as Vanicream, CeraVe, Cetaphil, Eucerin, Sarna lotion or plain Vaseline Jelly. For hands apply Neutrogena [place] Hand Cream or Excipial Hand Cream.  7. Reapply moisturizer any time you start to itch or feel dry.  8. Sometimes using free and clear laundry detergents can be helpful. Fabric softener sheets should be avoided. Downy Free & Gentle liquid, or any liquid fabric softener that is free of dyes and perfumes, it acceptable to use  9. If your doctor has given you prescription creams you may apply moisturizers over them    Recommend starting moisturizer with exfoliant (Urea, Salicylic acid, or Lactic acid) one to two times daily to help smooth rough and bumpy skin.  OTC options include Cetaphil Rough and Bumpy lotion (Urea), Eucerin Roughness Relief lotion or spot treatment cream (Urea), CeraVe SA lotion/cream for Rough and Bumpy skin (Sal Acid), [place] Rough and Bumpy cream (Sal Acid), and AmLactin 12% lotion/cream (Lactic Acid).  If applying in morning, also apply sunscreen to sun-exposed areas, since these exfoliating moisturizers can increase sensitivity to sun.   Melanoma ABCDEs  Melanoma is the most dangerous type of skin cancer, and is the leading cause of death from skin disease.  You are more likely to develop melanoma if  you: Have light-colored skin, light-colored eyes, or red or blond hair Spend a lot of time in the sun Tan regularly, either outdoors or in a tanning bed Have had blistering sunburns, especially during childhood Have a close family member who has had a melanoma Have atypical moles or large birthmarks  Early detection of melanoma is key since treatment is typically straightforward and cure rates are extremely high if we catch it early.   The first sign of melanoma is often a change in a mole or a new dark spot.  The ABCDE system is a way of remembering the signs of melanoma.  A for asymmetry:  The two halves do not match. B for border:  The edges of the growth are irregular. C for color:  A mixture of colors are present instead of an even brown color. D for diameter:  Melanomas are usually (but not always) greater than 26mm - the size of a pencil eraser. E for evolution:  The spot keeps changing in size, shape, and color.  Please check your skin once per month between visits. You can use a small mirror in front and a large mirror behind you to keep an eye on the back side or your body.   If you see any new or changing lesions before your next follow-up, please call to schedule a visit.  Please continue daily skin protection including broad spectrum sunscreen SPF 30+ to sun-exposed areas, reapplying every 2 hours as needed when you're outdoors.  Staying in the shade or wearing long sleeves, sun glasses (UVA+UVB protection) and wide brim hats (4-inch brim around the entire circumference of the hat) are also recommended for sun protection.     Due to recent changes in healthcare laws, you may see results of your pathology and/or laboratory studies on MyChart before the doctors have had a chance to review them. We understand that in some cases there may be results that are confusing or concerning to you. Please understand that not all results are received at the same time and often the doctors  may need to interpret multiple results in order to provide you with the best plan of care or course of treatment. Therefore, we ask that you please give [place] 2 business days to thoroughly review all your results before contacting the office for clarification. Should we see a critical lab result, you will be contacted sooner.   If You Need Anything After Your Visit  If you have any questions or concerns for your doctor, please call our main line at [phone] and press option 4 to reach your doctor's medical assistant. If no one answers, please leave a voicemail as directed and we will return your call as soon as possible. Messages left after 4 pm will be answered the following business day.   You may also send [place] a message via [place]. We typically respond to MyChart messages within 1-2 business days.  For prescription refills, please ask your pharmacy to contact our office. Our fax number is [phone].  If you have an urgent issue when the clinic is closed that cannot wait until the next business day, you can page your doctor at the number below.    Please note that while we do our best to be available for urgent issues outside of office hours, we are not available 24/7.   If you have an urgent issue and are unable to reach [place], you may choose to seek medical care at your doctor's office, retail clinic, urgent care center, or emergency room.  If you have a medical emergency, please immediately call 911 or go to the emergency department.  Pager Numbers  - Dr. Nehemiah Massed: [phone]  - Dr. Laurence Ferrari: [phone]  - Dr. Nicole Kindred: [phone]  In the event of inclement weather, please call our main line at [phone] for an update on the status of any delays or closures.  Dermatology Medication Tips: Please keep the boxes that topical medications come in in order to help keep track of the instructions about where and how to use these. Pharmacies typically print the medication instructions  only on the boxes and not directly on the medication tubes.   If your medication is too expensive, please contact our office at [phone] option 4 or send [place] a message through [place].   We are unable to tell what your co-pay for medications will be in advance as this is different depending on your insurance coverage. However, we may be able to find a substitute medication at lower cost or fill out paperwork to get insurance to cover a needed medication.   If a prior authorization is required to get your medication covered by your insurance company, please allow [place] 1-2 business days to complete this process.  Drug prices often vary depending on where the prescription is filled and some pharmacies may offer cheaper prices.  The website www.goodrx.com contains coupons for medications through different pharmacies. The prices here do not account for what the cost may be with help  from insurance (it may be cheaper with your insurance), but the website can give you the price if you did not use any insurance.  - You can print the associated coupon and take it with your prescription to the pharmacy.  - You may also stop by our office during regular business hours and pick up a GoodRx coupon card.  - If you need your prescription sent electronically to a different pharmacy, notify our office through [facility] or by phone at [phone] option 4.     Si Usted Necesita Algo Despus de Su Visita  Tambin puede enviarnos un mensaje a travs de Pharmacist, community. Por lo general respondemos a los mensajes de MyChart en el transcurso de 1 a 2 das hbiles.  Para renovar recetas, por favor pida a su farmacia que se ponga en contacto con nuestra oficina. Harland Dingwall de fax es [place] [phone].  Si tiene un asunto urgente cuando la clnica est cerrada y que no puede esperar hasta el siguiente da hbil, puede llamar/localizar a su doctor(a) al nmero que aparece a continuacin.   Por favor, tenga en  cuenta que aunque hacemos todo lo posible para estar disponibles para asuntos urgentes fuera del horario de [place], no estamos disponibles las 24 horas del da, los 7 das de la [place].   Si tiene un problema urgente y no puede comunicarse con nosotros, puede optar por buscar atencin mdica  en el consultorio de su doctor(a), en una clnica privada, en un centro de atencin urgente o en una sala de emergencias.  Si tiene Engineering geologist, por favor llame inmediatamente al 911 o vaya a la sala de emergencias.  Nmeros de bper  - Dr. Nehemiah Massed: [phone]  - Dra. Moye: [phone]  - Dra. Nicole Kindred: [phone]  En caso de inclemencias del [place], por favor llame a Johnsie Kindred principal al [phone] para una actualizacin sobre el [place] de cualquier retraso o cierre.  Consejos para la medicacin en dermatologa: Por favor, guarde las cajas en las que vienen los medicamentos de uso tpico para ayudarle a seguir las instrucciones sobre dnde y cmo usarlos. Las farmacias generalmente imprimen las instrucciones del medicamento slo en las cajas y no directamente en los tubos del [place].   Si su medicamento es muy caro, por favor, pngase en contacto con Zigmund Daniel llamando al [phone] y presione la opcin 4 o envenos un mensaje a travs de Pharmacist, community.   No podemos decirle cul ser su copago por los medicamentos por adelantado ya que esto es diferente dependiendo de la cobertura de su seguro. Sin embargo, es posible que podamos encontrar un medicamento sustituto a Electrical engineer un formulario para que el seguro cubra el medicamento que se considera necesario.   Si se requiere una autorizacin previa para que su compaa de seguros [place] su medicamento, por favor permtanos de 1 a 2 das hbiles para completar este proceso.  Los precios de los medicamentos varan con frecuencia dependiendo del Environmental consultant de dnde se surte la receta y alguna farmacias pueden ofrecer  precios ms baratos.  El sitio web www.goodrx.com tiene cupones para medicamentos de Airline pilot. Los precios aqu no tienen en cuenta lo que podra costar con la ayuda del seguro (puede ser ms barato con su seguro), pero el sitio web puede darle el precio si no utiliz Research scientist (physical sciences).  - Puede imprimir el cupn correspondiente y llevarlo con su receta a la farmacia.  - Tambin puede pasar por nuestra oficina durante el horario  de atencin regular y Charity fundraiser una tarjeta de cupones de GoodRx.  - Si necesita que su receta se enve electrnicamente a una farmacia diferente, informe a nuestra oficina a travs de MyChart de [place] o por telfono llamando al [phone] y presione la opcin 4.

## 2022-05-10 ENCOUNTER — Encounter: Payer: Medicare HMO | Admitting: *Deleted

## 2022-05-10 DIAGNOSIS — Z952 Presence of prosthetic heart valve: Secondary | ICD-10-CM | POA: Diagnosis not present

## 2022-05-10 NOTE — Progress Notes (Signed)
Daily Session Note  Patient Details  Name: Melody Soto MRN: [id number] Date of Birth: October 21, 1950 Referring Provider:   Flowsheet Row Cardiac Rehab from 02/28/2022 in [facility] Cardiac and Pulmonary Rehab  Referring Provider Lujean Amel MD       Encounter Date: 05/10/2022  Check In:  Session Check In - 05/10/22 1102       Check-In   Supervising physician immediately available to respond to emergencies See telemetry face sheet for immediately available ER MD    Location [facility]-Cardiac & Pulmonary Rehab    Staff Present Darlyne Russian, RN, ADN;Meredith Sherryll Burger, RN Abel Presto, MS, ACSM CEP, Exercise Physiologist;Joseph Tessie Fass, [place]    Virtual Visit No    Medication changes reported     No    Fall or balance concerns reported    No    Warm-up and Cool-down Performed on first and last piece of equipment    Resistance Training Performed Yes    VAD Patient? No    PAD/SET Patient? No      Pain Assessment   Currently in Pain? No/denies                Social History   Tobacco Use  Smoking Status Never  Smokeless Tobacco Never    Goals Met:  Independence with exercise equipment Exercise tolerated well No report of concerns or symptoms today Strength training completed today  Goals Unmet:  Not Applicable  Comments: Pt able to follow exercise prescription today without complaint.  Will continue to monitor for progression.    Dr. Emily Filbert is Medical Director for [place].  Dr. Ottie Glazier is Medical Director for [facility] Pulmonary Rehabilitation.

## 2022-05-11 ENCOUNTER — Encounter: Payer: Medicare HMO | Admitting: *Deleted

## 2022-05-11 DIAGNOSIS — Z952 Presence of prosthetic heart valve: Secondary | ICD-10-CM

## 2022-05-11 NOTE — Progress Notes (Signed)
Daily Session Note  Patient Details  Name: Melody Soto MRN: [id number] Date of Birth: [birth year] Referring Provider:   Flowsheet Row Cardiac Rehab from 02/28/2022 in [facility] Cardiac and Pulmonary Rehab  Referring Provider Lujean Amel MD       Encounter Date: 05/11/2022  Check In:  Session Check In - 05/11/22 1152       Check-In   Supervising physician immediately available to respond to emergencies See telemetry face sheet for immediately available ER MD    Location [facility]-Cardiac & Pulmonary Rehab    Staff Present Heath Lark, RN, BSN, CCRP;Krista Frederico Hamman RN, BSN;Joseph [employer], [place]    Virtual Visit No    Medication changes reported     No    Fall or balance concerns reported    No    Warm-up and Cool-down Performed on first and last piece of equipment    Resistance Training Performed Yes    VAD Patient? No    PAD/SET Patient? No      Pain Assessment   Currently in Pain? No/denies                Social History   Tobacco Use  Smoking Status Never  Smokeless Tobacco Never    Goals Met:  Independence with exercise equipment Exercise tolerated well No report of concerns or symptoms today  Goals Unmet:  Not Applicable  Comments: Pt able to follow exercise prescription today without complaint.  Will continue to monitor for progression.    Dr. Emily Filbert is Medical Director for [place].  Dr. Ottie Glazier is Medical Director for [facility] Pulmonary Rehabilitation.

## 2022-05-14 NOTE — Progress Notes (Unsigned)
Name: Melody Soto   MRN: [id number]    DOB: 09-Sep-1950   Date:05/15/2022       Progress Note  Subjective  Chief Complaint  Follow Up  HPI  History of endometrial cancer:  no longer seeing Dr. Donella Stade and has one more visit this year surgical oncologist Dr. Alto Denver. She denies any vaginal bleeding or pelvic pain. No changes    HTN: she is currently only taking Metoprolol 50 mg daily and doing well, bp is at goal. No chest pain or palpitation    Morbid obesity: she went on Keto diet since she saw her A1C was 6.1 % back in March 2021 , she is on intermittent fasting also and low carb diet . Weight down from 295 lbs March 2021 , weight is now stable in the mid 220 lbs. She is currently not really following a diet, but tries to eat a salad every day  Metabolic syndrome: last hgb A1C was up to 6.1 % but last visit it was down to 5.5 %  June 2023 , she sates a nurse visit showed A1C of 6 % - she had resumed eating cookies Denies polyphagia, polyuria or polydipsia.   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 She is on Toprol XL No palpitation, chest pain or orthopnea. She also has a history of CHF , medically treated now and doing well, she is still having cardiac rehab  Echo 12/2021    NORMAL LEFT VENTRICULAR SYSTOLIC FUNCTION WITH MILD LVH    NORMAL RIGHT VENTRICULAR SYSTOLIC FUNCTION    VALVULAR REGURGITATION: MILD MR, TRIVIAL PR, MILD TR    PROSTHETIC VALVE(S): BIOPROSTHETIC AoV    S/P AVR 12/15/2021    3D acquisition and reconstructions were performed as part of this    examination to more accurately quantify the effects of identified    structural abnormalities as part of  the exam. (post-processing on an    Independent workstation).    Echo done 02/2020     NORMAL LEFT VENTRICULAR SYSTOLIC FUNCTION WITH MILD LVH    NORMAL RIGHT VENTRICULAR SYSTOLIC FUNCTION    VALVULAR REGURGITATION: MILD AR, TRIVIAL MR, TRIVIAL PR, TRIVIAL TR    MILD  PARAVALVULAR AR UNCHANGED FROM PRIORS    3D acquisition and reconstructions were performed as part of this    examination to more accurately quantify the effects of identified    structural abnormalities as part of the exam. (post-processing on an    Independent workstation).    Echo 09/2019 at [place] ---------------------------------------------------------------    SEVERE LV DYSFUNCTION (See above)    MILD RV SYSTOLIC DYSFUNCTION (See above)    VALVULAR REGURGITATION: MILD AR, MILD MR, TRIVIAL PR, MILD TR    DILATED ASCENDING AORTA 3.8 cm.    S/P TAVR WITH MILD-MODERATE PARAVALVULAR AORTIC REGURGITATION, UNCHANGED    FROM PRIOR.     Echo was 12/2018  INTERPRETATION ---------------------------------------------------------------   NORMAL LEFT VENTRICULAR SYSTOLIC FUNCTION WITH MILD LVH   NORMAL RIGHT VENTRICULAR SYSTOLIC FUNCTION   VALVULAR REGURGITATION: MILD AR, TRIVIAL MR, TRIVIAL PR   S/P TAVR WITH MILD-TO-MODERATE PARAVALVULAR REGURGITATION   ASCENDING AO FLOW TURBULENCE ( PEAK VEL.  2.7 M/S)  Transesophageal echo done at [facility] 11/09/2019  CONCLUSIONS ------------------------------------------------------------------    1. No LAA thrombus    2. Moderate LV dysfunction    3. Well-seated TAVR, good mobility. Trivial paravalvular AR    4. Mild MR      Major Depression: she took Celexa many years ago She is still unable to see her grand-daughter, she has not seen her youngest daughter  in the past 4 years. She re-connected with her ex-husband - they are now friends. She is coping by taking care of herself and has a good circle of friends Unchanged    Urinary incontinence: she was seen by  Urologist , tried medication but did not improve symptoms, she states nocturia has improved - down from 3 times per night to once per night now. She has urgency - she still has to wear pads She does not want to do PT . Unchanged   Atrial fibrillation: found during colonoscopy this Summer, procedure was scheuduled for 09/09/2019 but was cancelled due to Afib. She went to [facility], had multiple procedures done and cardioversion . She is now on Toprol , off Spironolactone, taking  Jardiance and Amiodarone ,  rhythm has been controlled. Also on Eliquis . She denies sob or chest pain She has intermittent palpitation but seldom . They tried cardioversion twice but only stayed on heart rhythm for less than two weeks  Aorta dilation and Aorta Atherosclerosis:  she is on statin therapy only twice a week and eliquis , denies side effects , we will recheck labs next visit   Patient Active Problem List   Diagnosis Date Noted   Hypercalcemia 07/28/2021   Chronic combined systolic and diastolic congestive heart failure 07/28/2021   History of colon polyps 07/28/2021   Elevated hematocrit 07/28/2021   Vitamin D deficiency 07/28/2021   Hyperglycemia 07/28/2021   Skin lesion 07/28/2021   Mild major depression 12/23/2020   Obesity (BMI 30.0-34.9) 12/23/2020   Thoracic aorta atherosclerosis 12/23/2020   Subclavian vein occlusion, right 10/04/2020   Paroxysmal atrial fibrillation 10/02/2019   Diverticulosis of colon 08/08/2018   Cholelithiasis 08/08/2018   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   Chronic acquired lymphedema 01/08/2017   FIGO stage II endometrial cancer 10/29/2016   Left ventricular hypertrophy 11/27/2014   Benign essential HTN 08/21/2014   Dyslipidemia 08/21/2014   Cardiac dysrhythmia 0000000   Dysmetabolic syndrome 0000000   Female genuine stress incontinence 08/21/2014   History of shingles 08/21/2014   Personal history of skin cancer 11/24/2009    Past  Surgical History:  Procedure Laterality Date   AORTIC VALVE REPLACEMENT Bilateral 02/27/2017   CARDIAC CATHETERIZATION     CARDIAC VALVE REPLACEMENT  02/27/2017   CARDIOVERSION  10/2019   cataract surgery Bilateral 05/24/2014   second eye 06/07/2014   COLONOSCOPY WITH PROPOFOL N/A 09/24/2019   Procedure: COLONOSCOPY WITH PROPOFOL;  Surgeon: [name], MD;  Location: [facility] ENDOSCOPY;  Service: Gastroenterology;  Laterality: N/A;   COLONOSCOPY WITH PROPOFOL N/A 12/10/2019   Procedure: COLONOSCOPY WITH PROPOFOL;  Surgeon: [name], MD;  Location: [facility] ENDOSCOPY;  Service: Endoscopy;  Laterality: N/A;   CORONARY ARTERY BYPASS GRAFT     EYE SURGERY     MELANOMA EXCISION     OVARIAN CYST REMOVAL     EXPLORATORY LAPAROTOMY   TONSILLECTOMY      Family History  Problem Relation Age of Onset   Stroke Mother    Thyroid nodules Mother    Lung cancer  Mother    Cancer Mother    Hypertension Mother    Diabetes Sister    Hypertension Sister    Aortic aneurysm Father    Early death Father    Heart disease Father    Obesity Father    Stroke Father    Breast cancer Maternal Aunt    Bone cancer Maternal Uncle    Depression Daughter    Anxiety disorder Daughter    Depression Daughter     Social History   Tobacco Use   Smoking status: Never   Smokeless tobacco: Never  Substance Use Topics   Alcohol use: Not Currently     Current Outpatient Medications:    amiodarone (PACERONE) 200 MG tablet, Take 1 tablet (200 mg total) by mouth daily., Disp: 30 tablet, Rfl: 0   Ascorbic Acid (VITAMIN C) 1000 MG tablet, Take 1,000 mg by mouth in the morning and at bedtime., Disp: , Rfl:    atorvastatin (LIPITOR) 40 MG tablet, TAKE 1 TABLET (40 MG TOTAL) BY MOUTH 2 (TWO) TIMES A WEEK., Disp: 24 tablet, Rfl: 1   furosemide (LASIX) 40 MG tablet, Take 20 mg by mouth daily., Disp: , Rfl:    JARDIANCE 10 MG TABS tablet, Take 10 mg by mouth daily., Disp: , Rfl:    Magnesium 500 MG  CAPS, Take 800 mg by mouth in the morning and at bedtime. , Disp: , Rfl:    metoprolol succinate (TOPROL-XL) 50 MG 24 hr tablet, Take 150 mg by mouth daily., Disp: , Rfl:    Omega-3 Fatty Acids (FISH OIL) 1000 MG CAPS, Take 1 capsule by mouth daily., Disp: , Rfl:    Potassium Chloride ER 20 MEQ TBCR, Take 2 tablets by mouth daily., Disp: , Rfl:    Vitamin D-Vitamin K (VITAMIN K2-VITAMIN D3 PO), Take by mouth in the morning and at bedtime., Disp: , Rfl:    vitamin E 200 UNIT capsule, Take 200 Units by mouth 2 (two) times daily., Disp: , Rfl:    zinc gluconate 50 MG tablet, Take 50 mg by mouth daily., Disp: , Rfl:   Allergies  Allergen Reactions   Protamine Other (See Comments)    Severe hypotension refractory to pressor and volume administration, only reversed by administration of epinephrine.   Ace Inhibitors Other (See Comments)    UNKNOWN REACTION   Duloxetine Other (See Comments)    Drowsiness    I personally reviewed active problem list, medication list, allergies, family history, social history, health maintenance with the patient/caregiver today.   ROS  Ten systems reviewed and is negative except as mentioned in HPI   Objective  Vitals:   05/15/22 1055  BP: 126/72  Pulse: 94  Resp: 16  SpO2: 96%  Weight: 226 lb (102.5 kg)  Height: 5\' 8"  (1.727 m)    Body mass index is 34.36 kg/m.  Physical Exam  Constitutional: Patient appears well-developed and well-nourished. Obese  No distress.  HEENT: head atraumatic, normocephalic, pupils equal and reactive to light, neck supple Cardiovascular: Normal rate, regular rhythm , 2/6 murmur . Non pitting BLE edema. Pulmonary/Chest: Effort normal and breath sounds normal. No respiratory distress. Abdominal: Soft.  There is no tenderness. Psychiatric: Patient has a normal mood and affect. behavior is normal. Judgment and thought content normal.   Recent Results (from the past 2160 hour(s))  POCT Influenza A/B     Status: Normal    Collection Time: 02/23/22 11:15 AM  Result Value Ref Range   Influenza A, POC  Negative Negative   Influenza B, POC Negative Negative  CBC with Differential/Platelet     Status: None   Collection Time: 02/23/22  2:26 PM  Result Value Ref Range   WBC 6.7 3.8 - 10.8 Thousand/uL   RBC 4.36 3.80 - 5.10 Million/uL   Hemoglobin 14.2 11.7 - 15.5 g/dL   HCT 42.2 35.0 - 45.0 %   MCV 96.8 80.0 - 100.0 fL   MCH 32.6 27.0 - 33.0 pg   MCHC 33.6 32.0 - 36.0 g/dL   RDW 13.1 11.0 - 15.0 %   Platelets 337 140 - 400 Thousand/uL   MPV 9.8 7.5 - 12.5 fL   Neutro Abs 4,402 1,500 - 7,800 cells/uL   Lymphs Abs 1,420 850 - 3,900 cells/uL   Absolute Monocytes 737 200 - 950 cells/uL   Eosinophils Absolute 80 15 - 500 cells/uL   Basophils Absolute 60 0 - 200 cells/uL   Neutrophils Relative % 65.7 %   Total Lymphocyte 21.2 %   Monocytes Relative 11.0 %   Eosinophils Relative 1.2 %   Basophils Relative 0.9 %    PHQ2/9:    05/15/2022   10:48 AM 02/28/2022    3:32 PM 02/23/2022   11:14 AM 01/31/2022    1:44 PM 12/01/2021    1:57 PM  Depression screen PHQ 2/9  Decreased Interest 0 0 0 0 1  Down, Depressed, Hopeless 0 0 0 0 0  PHQ - 2 Score 0 0 0 0 1  Altered sleeping 0 0 0 0 0  Tired, decreased energy 0 0 0 0 3  Change in appetite 0 0 0 0 0  Feeling bad or failure about yourself  0 0 0 0 0  Trouble concentrating 0 0 0 0 0  Moving slowly or fidgety/restless 0 0 0 0 0  Suicidal thoughts 0 0 0 0 0  PHQ-9 Score 0 0 0 0 4  Difficult doing work/chores  Not difficult at all       phq 9 is negative   Fall Risk:    05/15/2022   10:48 AM 02/28/2022    9:09 AM 02/23/2022   11:14 AM 01/31/2022    1:44 PM 12/01/2021    1:57 PM  Fall Risk   Falls in the past year? 0 1 0 0 0  Comment  sick weak and dehydrated, sat on toilet, turned and slide off.     Number falls in past yr: 0 0  0 0  Injury with Fall? 0 0  0 0  Risk for fall due to : No Fall Risks No Fall Risks;Medication side effect No Fall Risks No  Fall Risks No Fall Risks  Follow up Falls prevention discussed Falls evaluation completed;Falls prevention discussed;Education provided Falls prevention discussed Falls prevention discussed Falls prevention discussed      Functional Status Survey: Is the patient deaf or have difficulty hearing?: No Does the patient have difficulty seeing, even when wearing glasses/contacts?: No Does the patient have difficulty concentrating, remembering, or making decisions?: No Does the patient have difficulty walking or climbing stairs?: Yes Does the patient have difficulty dressing or bathing?: No Does the patient have difficulty doing errands alone such as visiting a doctor's office or shopping?: No    Assessment & Plan  1. Thrombus of aorta  On statin therapy   2. Paroxysmal atrial fibrillation  Rate controlled  3. Morbid obesity  Discussed with the patient the risk posed by an increased BMI. Discussed importance of portion control,  calorie counting and at least 150 minutes of physical activity weekly. Avoid sweet beverages and drink more water. Eat at least 6 servings of fruit and vegetables daily    4. Chronic combined systolic and diastolic congestive heart failure  Stable doing cardiac rehab  5. Mild major depression  She is doing well   6. Endometrial cancer   7. S/P TAVR (transcatheter aortic valve replacement)  Stable  8. Benign essential HTN   Bp is at goal

## 2022-05-15 ENCOUNTER — Encounter: Payer: Medicare HMO | Attending: Internal Medicine

## 2022-05-15 ENCOUNTER — Encounter: Payer: Self-pay | Admitting: Family Medicine

## 2022-05-15 ENCOUNTER — Ambulatory Visit (INDEPENDENT_AMBULATORY_CARE_PROVIDER_SITE_OTHER): Payer: Medicare HMO | Admitting: Family Medicine

## 2022-05-15 VITALS — BP 126/72 | HR 94 | Resp 16 | Ht 68.0 in | Wt 226.0 lb

## 2022-05-15 DIAGNOSIS — R69 Illness, unspecified: Secondary | ICD-10-CM | POA: Diagnosis not present

## 2022-05-15 DIAGNOSIS — E441 Mild protein-calorie malnutrition: Secondary | ICD-10-CM | POA: Insufficient documentation

## 2022-05-15 DIAGNOSIS — I480 Paroxysmal atrial fibrillation: Secondary | ICD-10-CM | POA: Diagnosis not present

## 2022-05-15 DIAGNOSIS — I10 Essential (primary) hypertension: Secondary | ICD-10-CM

## 2022-05-15 DIAGNOSIS — C541 Malignant neoplasm of endometrium: Secondary | ICD-10-CM

## 2022-05-15 DIAGNOSIS — I5042 Chronic combined systolic (congestive) and diastolic (congestive) heart failure: Secondary | ICD-10-CM | POA: Diagnosis not present

## 2022-05-15 DIAGNOSIS — I7410 Embolism and thrombosis of unspecified parts of aorta: Secondary | ICD-10-CM

## 2022-05-15 DIAGNOSIS — Z952 Presence of prosthetic heart valve: Secondary | ICD-10-CM

## 2022-05-15 DIAGNOSIS — F320 Major depressive disorder, single episode, mild: Secondary | ICD-10-CM

## 2022-05-17 ENCOUNTER — Encounter: Payer: Medicare HMO | Admitting: *Deleted

## 2022-05-17 DIAGNOSIS — Z952 Presence of prosthetic heart valve: Secondary | ICD-10-CM | POA: Diagnosis not present

## 2022-05-17 NOTE — Progress Notes (Signed)
Daily Session Note  Patient Details  Name: Melody Soto MRN: [id number] Date of Birth: 29-Jan-1951 Referring Provider:   Flowsheet Row Cardiac Rehab from 02/28/2022 in [facility] Cardiac and Pulmonary Rehab  Referring Provider Lujean Amel MD       Encounter Date: 05/17/2022  Check In:  Session Check In - 05/17/22 1121       Check-In   Supervising physician immediately available to respond to emergencies See telemetry face sheet for immediately available ER MD    Location [facility]-Cardiac & Pulmonary Rehab    Staff Present Darlyne Russian, RN, Lorin Mercy, MS, ACSM CEP, Exercise Physiologist;Joseph Tessie Fass, [place]    Virtual Visit No    Medication changes reported     No    Fall or balance concerns reported    No    Warm-up and Cool-down Performed on first and last piece of equipment    Resistance Training Performed Yes    VAD Patient? No    PAD/SET Patient? No      Pain Assessment   Currently in Pain? No/denies                Social History   Tobacco Use  Smoking Status Never  Smokeless Tobacco Never    Goals Met:  Independence with exercise equipment Exercise tolerated well No report of concerns or symptoms today Strength training completed today  Goals Unmet:  Not Applicable  Comments: Pt able to follow exercise prescription today without complaint.  Will continue to monitor for progression.    Dr. Emily Filbert is Medical Director for [place].  Dr. Ottie Glazier is Medical Director for [facility] Pulmonary Rehabilitation.

## 2022-05-18 ENCOUNTER — Telehealth: Payer: Self-pay

## 2022-05-18 DIAGNOSIS — Z952 Presence of prosthetic heart valve: Secondary | ICD-10-CM

## 2022-05-18 NOTE — Progress Notes (Signed)
Daily Session Note  Patient Details  Name: Melody Soto MRN: [id number] Date of Birth: 25-Jul-1950 Referring Provider:   Flowsheet Row Cardiac Rehab from 02/28/2022 in [facility] Cardiac and Pulmonary Rehab  Referring Provider Dorothyann Peng MD       Encounter Date: 05/18/2022  Check In:  Session Check In - 05/18/22 1112       Check-In   Staff Present Darcel Bayley, RN,BC,MSN;Joseph [place], RCP,RRT,BSRT;Noah [place], [place], Exercise Physiologist    Virtual Visit No    Medication changes reported     No    Fall or balance concerns reported    No    Tobacco Cessation No Change    Warm-up and Cool-down Performed on first and last piece of equipment    Resistance Training Performed Yes    VAD Patient? No    PAD/SET Patient? No      Pain Assessment   Currently in Pain? No/denies    Multiple Pain Sites No                Social History   Tobacco Use  Smoking Status Never  Smokeless Tobacco Never    Goals Met:  Independence with exercise equipment Exercise tolerated well No report of concerns or symptoms today  Goals Unmet:  Not Applicable  Comments: Pt able to follow exercise prescription today without complaint.  Will continue to monitor for progression.    Dr. Bethann Punches is Medical Director for [facility] Card[facility] Rehabilitation.  Dr. Vida Rigger is Medical Director for [facility] Pulmonary Rehabilitation.

## 2022-05-18 NOTE — Progress Notes (Unsigned)
Care Management & Coordination Services Pharmacy Team  Reason for Encounter: Appointment Reminder  Contacted patient to confirm in office appointment with Julious Payer , PharmD on 05/23/2022 at 2:00 pm.  {US HC Outreach:[id number]}  Do you have any problems getting your medications? {yes/no:20286} If yes what types of problems are you experiencing? {Problems:27223}  What is your top health concern you would like to discuss at your upcoming visit?   Have you seen any other providers since your last visit with PCP? {yes/no:20286}   Chart review:  Recent office visits:  05/15/2022 Dr. Carlynn Purl MD (PCP) No Medication changes noted, Return in about 6 months  02/23/2022 Dr. Carlynn Purl MD (PCP) No Medication changes noted 01/31/2022 Dr. Carlynn Purl MD (PCP) per note no longer on Eliquis, just aspirin. Off spironolactone and also micardis HCTZ  12/01/2021 Dr. Carlynn Purl MD (PCP)No Medication changes noted   Recent consult visits:  05/18/2022 [facility] RN(Cardiac Rehab) No medication changes noted 05/17/2022 Lanny Hurst RN (Cardiac Rehab) No medication changes noted 05/11/2022 Cora Collum RN (Cardiac Rehab) No medication changes noted 05/10/2022 Lanny Hurst RN (Cardiac Rehab) No medication changes noted 05/08/2022 Dr. Roseanne Reno MD (Dermatology) No medication changes noted  05/08/2022 Cora Collum RN (Cardiac Rehab) No medication changes noted 05/04/2022 Cora Collum RN (Cardiac Rehab) No medication changes noted 05/03/2022 Bess Kinds RN (Cardiac Rehab) No medication changes noted 04/27/2022 Cora Collum RN (Cardiac Rehab) No medication changes noted 04/26/2022 Lanny Hurst RN (Cardiac Rehab) No medication changes noted 04/24/2022 [facility](Cardiac Rehab) No medication changes noted 04/20/2022  Cora Collum RN (Cardiac Rehab) No medication changes noted 04/19/2022 Lanny Hurst RN (Cardiac Rehab) No medication changes noted 04/13/2022 Lanny Hurst RN (Cardiac Rehab) No medication changes  noted 04/12/2022 Bess Kinds RN (Cardiac Rehab) No medication changes noted 04/06/2022 Bess Kinds RN (Cardiac Rehab) No medication changes noted 04/03/2022 Fabio Pierce (Cardiac Rehab) No medication changes noted 03/29/2022 Lanny Hurst RN (Cardiac Rehab) No medication changes noted 03/27/2022 [facility](Cardiac Rehab) No medication changes noted 03/22/2022 [facility](Cardiac Rehab) No medication changes noted 03/20/2022 [facility](Cardiac Rehab) No medication changes noted 03/15/2022 Dr. Regino Schultze MD (Cardiology)Stop Diltiazem ,Stop aspirin , Eliquis should be 5 mg twice a day , Metoprolol changed to 50 mg (1 pill) twice a day ,Stop furosemide and potassium  03/15/2022 Lanny Hurst RN (Cardiac Rehab) No medication changes noted 03/13/2022 [facility](Cardiac Rehab) No medication changes noted 03/09/2022 Cora Collum RN (Cardiac Rehab) No medication changes noted 03/06/2022 [facility](Cardiac Rehab) No medication changes noted 02/28/2022 Cora Collum RN (Cardiac Rehab) No medication changes noted 02/27/2022 Dr. Roseanne Reno MD (Dermatology) No Medication changes noted 02/19/2022 Dr. Mayford Knife MD (Infectious Disease) No Medication changes noted 01/25/2022 [facility] - Unable to see note 01/24/2022 [facility] - Unable to see note 01/23/2022 [facility] - Unable to see note 01/22/2022 [facility] - Unable to see note 01/20/2022 [facility] - Unable to see note 01/19/2022 [facility] - Unable to see note 01/18/2022 [facility] - Unable to see note 01/16/2022 [facility] - Unable to see note 01/15/2022 Dr. Mayford Knife MD (Infectious Disease) No Medication changes noted 01/14/2022 [facility] - Unable to see note 01/12/2022 [facility] - Unable to see note 01/10/2022 [facility] -  Unable to see note 01/08/2022 Dr. Zebedee Iba MD (Cardiology)Reduce midodrine from 3x/day to 2x/day; check BP before taking; if >110 for top number, skip dose,  Reduce furosemide (lasix) to  20mg  daily; reduce potassium 20meq daily.   01/08/2022  [facility] - Unable to see note 01/06/2022  [facility] to see note 01/04/2022  [facility] see [facility] 01/02/2022  [facility] 01/01/2022  [facility] - Unable to see note 12/31/2021  [facility] Unable to see note 12/29/2021   [facility] see [facility] 12/28/2021 [facility] - Unable to see note 11/29/2021 Dorothyann Pengwayne Callwood MD (Cardiology) Unable to see note 11/28/2022 Harl Favorelicia White NP (Cardiology) No Medication changes noted  Hospital visits:  Medication Reconciliation was completed by comparing discharge summary, patient's EMR and Pharmacy list, and upon discussion with patient.   Admitted to the hospital on 12/05/2021 due to Abnormal Lab. Discharge date was 12/07/2021. Discharged from 12/07/2021 Hospital.    [facility] -started Losartan 25 mg daily -Started sodium chloride 100 mL with ampicillin 2 gram SolR 2 g IVPB Inject 2 g into the vein every 4 (four) hours   Medication Changes at Hospital Discharge: -Changed None ID  Medications Discontinued at Hospital Discharge: -Stopped amoxicillin  -Stopped  docosahexaenoic acid  -Stopped Herbal Supplement  -Stopped telmisartan  -Stopped vitamin D3-vitamin K2   Medications that remain the same after Hospital Discharge:??  -All other medications will remain the same.    Admitted to the hospital on 12/03/2021 due to Other elevated white blood cell. Discharge date was 12/04/2021. [facility]    [facility]?? -started None ID  Medication Changes at Hospital Discharge: -Changed None ID  Medications Discontinued at Hospital Discharge: -Stopped None ID  Medications that remain the same after Hospital Discharge:??  -All other medications will remain the same.     Star Rating Drugs:  Medication: Atorvastatin 40 mg  Last Fill: 04/16/2022 [facility] Medication: Jardiance 10 mg  Last Fill: 04/19/2022 [facility]  Care Gaps: None ID   Everlean CherryBessie Kellihan,CPA Clinical Pharmacist Assistant [phone]

## 2022-05-19 ENCOUNTER — Other Ambulatory Visit: Payer: Self-pay | Admitting: Family Medicine

## 2022-05-19 DIAGNOSIS — I480 Paroxysmal atrial fibrillation: Secondary | ICD-10-CM

## 2022-05-23 ENCOUNTER — Ambulatory Visit: Payer: Medicare HMO

## 2022-05-23 DIAGNOSIS — I10 Essential (primary) hypertension: Secondary | ICD-10-CM

## 2022-05-23 DIAGNOSIS — I480 Paroxysmal atrial fibrillation: Secondary | ICD-10-CM

## 2022-05-23 DIAGNOSIS — E785 Hyperlipidemia, unspecified: Secondary | ICD-10-CM

## 2022-05-23 NOTE — Progress Notes (Unsigned)
Care Management & Coordination Services Pharmacy Note  05/23/2022 Name:  Melody Soto MRN:  [id number] DOB:  [birth year]  Summary: Patient presents for initial consult.   -Patient reports adherence to her atorvastatin, but does associate the medication with joint pain. She has tried multiple statins in the past, but is unsure if she has tried rosuvastatin.   -Patient reports worsening joint pain that limits her from staying as active as she would like.   Recommendations/Changes made from today's visit: -Recommend Acetaminophen CR 650 mg twice daily.  -Recommend rechecking FLP at PCP follow-up.  Follow up plan: CPP follow-up 6 months   Recommended Problem List Changes:   Add: Osteopenia of right femoral neck   Subjective: Melody Soto is an 72 y.o. year old female who is a primary patient of Alba Cory, MD.  The care coordination team was consulted for assistance with disease management and care coordination needs.    Engaged with patient face to face for follow up visit.  Recent office visits: 05/15/2022 Dr. Carlynn Purl MD (PCP) No Medication changes noted, Return in about 6 months  02/23/2022 Dr. Carlynn Purl MD (PCP) No Medication changes noted 01/31/2022 Dr. Carlynn Purl MD (PCP) per note no longer on Eliquis, just aspirin. Off spironolactone and also micardis HCTZ  12/01/2021 Dr. Carlynn Purl MD (PCP)No Medication changes noted  Recent consult visits: 05/08/2022 Dr. Roseanne Reno MD (Dermatology) No medication changes noted 03/15/2022 Dr. Regino Schultze MD (Cardiology)Stop Diltiazem ,Stop aspirin , Eliquis should be 5 mg twice a day , Metoprolol changed to 50 mg (1 pill) twice a day ,Stop furosemide and potassium  02/19/2022 Dr. Mayford Knife MD (Infectious Disease) No Medication changes noted 01/15/2022 Dr. Mayford Knife MD (Infectious Disease) No Medication changes noted 01/08/2022 Dr. Zebedee Iba MD (Cardiology)Reduce midodrine from 3x/day to 2x/day; check BP before taking; if >110 for top number, skip dose,  Reduce  furosemide (lasix) to 20mg  daily; reduce potassium daily.   11/29/2021 Dorothyann Peng MD (Cardiology) Unable to see note 11/28/2022 Harl Favor NP (Cardiology) No Medication changes noted  Hospital visits:  Admitted to the hospital on 12/05/2021 due to Abnormal Lab. Discharge date was 12/07/2021. Discharged from 12/07/2021 Hospital.   Admitted to the hospital on 12/03/2021 due to Other elevated white blood cell. Discharge date was 12/04/2021. Discharged from [facility].    Objective:  Lab Results  Component Value Date   CREATININE 0.82 12/03/2021   BUN 14 12/03/2021   EGFR 94 07/28/2021   GFRNONAA >60 12/03/2021   GFRAA 105 06/21/2020   NA 132 (L) 12/03/2021   K 4.4 12/03/2021   CALCIUM 9.5 12/03/2021   CO2 23 12/03/2021   GLUCOSE 131 (H) 12/03/2021    Lab Results  Component Value Date/Time   HGBA1C 5.5 07/28/2021 11:21 AM   HGBA1C 5.5 12/23/2020 11:41 AM    Last diabetic Eye exam: No results found for: "HMDIABEYEEXA"  Last diabetic Foot exam: No results found for: "HMDIABFOOTEX"   Lab Results  Component Value Date   CHOL 222 (H) 07/28/2021   HDL 65 07/28/2021   LDLCALC 135 (H) 07/28/2021   TRIG 112 07/28/2021   CHOLHDL 3.4 07/28/2021       Latest Ref Rng & Units 12/03/2021    5:22 PM 07/28/2021   11:21 AM 06/21/2020    2:12 PM  Hepatic Function  Total Protein 6.5 - 8.1 g/dL 7.6  6.8  6.8   Albumin 3.5 - 5.0 g/dL 2.9     AST 15 - 41 U/L 64  15  23   ALT 0 - 44 U/L 60  25  35   Alk Phosphatase 38 - 126 U/L 95     Total Bilirubin 0.3 - 1.2 mg/dL 1.0  0.5  0.6     Lab Results  Component Value Date/Time   TSH 0.885 11/23/2015 10:04 AM       Latest Ref Rng & Units 02/23/2022    2:26 PM 12/03/2021    5:22 PM 12/01/2021    2:53 PM  CBC  WBC 3.8 - 10.8 Thousand/uL 6.7  14.2  21.3   Hemoglobin 11.7 - 15.5 g/dL 97.3  53.2  99.2   Hematocrit 35.0 - 45.0 % 42.2  39.2  38.8   Platelets 140 - 400 Thousand/uL 337  236  282     Lab Results   Component Value Date/Time   VD25OH 93 07/28/2021 11:21 AM   VD25OH 107 (H) 12/23/2020 11:41 AM   VITAMINB12 511 05/06/2019 02:58 PM   VITAMINB12 733 06/24/2017 11:56 AM    Clinical ASCVD: No  The 10-year ASCVD risk score (Arnett DK, et al., 2019) is: 22.3%   Values used to calculate the score:     Age: 21 years     Sex: Female     Is Non-Hispanic African American: No     Diabetic: Yes     Tobacco smoker: No     Systolic Blood Pressure: 126 mmHg     Is BP treated: Yes     HDL Cholesterol: 65 mg/dL     Total Cholesterol: 222 mg/dL       05/14/6832   19:62 AM 02/28/2022    3:32 PM 02/23/2022   11:14 AM  Depression screen PHQ 2/9  Decreased Interest 0 0 0  Down, Depressed, Hopeless 0 0 0  PHQ - 2 Score 0 0 0  Altered sleeping 0 0 0  Tired, decreased energy 0 0 0  Change in appetite 0 0 0  Feeling bad or failure about yourself  0 0 0  Trouble concentrating 0 0 0  Moving slowly or fidgety/restless 0 0 0  Suicidal thoughts 0 0 0  PHQ-9 Score 0 0 0  Difficult doing work/chores  Not difficult at all      Social History   Tobacco Use  Smoking Status Never  Smokeless Tobacco Never   BP Readings from Last 3 Encounters:  05/15/22 126/72  05/08/22 138/73  02/27/22 130/71   Pulse Readings from Last 3 Encounters:  05/15/22 94  05/08/22 65  02/27/22 67   Wt Readings from Last 3 Encounters:  05/15/22 226 lb (102.5 kg)  02/28/22 219 lb 4.8 oz (99.5 kg)  02/23/22 217 lb (98.4 kg)   BMI Readings from Last 3 Encounters:  05/15/22 34.36 kg/m  02/28/22 31.92 kg/m  02/23/22 32.51 kg/m    Allergies  Allergen Reactions   Protamine Other (See Comments)    Severe hypotension refractory to pressor and volume administration, only reversed by administration of epinephrine.   Ace Inhibitors Other (See Comments)    UNKNOWN REACTION   Duloxetine Other (See Comments)    Drowsiness    Medications Reviewed Today     Reviewed by Alba Cory, MD (Physician) on 05/15/22 at  1059  Med List Status: <None>   Medication Order Taking? Sig Documenting Provider Last Dose Status Informant  amiodarone (PACERONE) 200 MG tablet [id number] Yes Take 1 tablet (200 mg total) by mouth daily. Alba Cory, MD Taking Active   Ascorbic Acid (VITAMIN C)  1000 MG tablet [id number] Yes Take 1,000 mg by mouth in the morning and at bedtime. [provider] Taking Active     Discontinued 05/14/22 0940 (Discontinued by provider) atorvastatin (LIPITOR) 40 MG tablet [id number] Yes TAKE 1 TABLET (40 MG TOTAL) BY MOUTH 2 (TWO) TIMES A WEEK. Alba Cory, MD Taking Active     Discontinued 05/14/22 0940 (Discontinued by provider)   furosemide (LASIX) 40 MG tablet [id number] Yes Take 20 mg by mouth daily. [provider] Taking Active   JARDIANCE 10 MG TABS tablet [id number] Yes Take 10 mg by mouth daily. [provider] Taking Active            Med Note Lesle Chris   Wed Aug 23, 2021 10:45 AM)    Magnesium 500 MG CAPS [id number] Yes Take 800 mg by mouth in the morning and at bedtime.  [provider] Taking Active Self  Discontinued 05/15/22 1059 (Duplicate)   metoprolol succinate (TOPROL-XL) 50 MG 24 hr tablet [id number] Yes Take 50 mg by mouth daily. [provider]  Active   Omega-3 Fatty Acids (FISH OIL) 1000 MG CAPS [id number] Yes Take 1 capsule by mouth daily. [provider] Taking Active   Potassium Chloride ER 20 MEQ TBCR [id number] Yes Take 2 tablets by mouth daily. [provider] Taking Active   Vitamin D-Vitamin K (VITAMIN K2-VITAMIN D3 PO) [id number] Yes Take by mouth in the morning and at bedtime. [provider] Taking Active   vitamin E 200 UNIT capsule [id number] Yes Take 200 Units by mouth 2 (two) times daily. [provider] Taking Active   zinc gluconate 50 MG tablet [id number] Yes Take 50 mg by mouth daily. [provider] Taking Active             SDOH:  (Social Determinants of  Health) assessments and interventions performed: Yes SDOH Interventions    Flowsheet Row Clinical Support from 09/12/2021 in [facility] Clinical Support from 08/25/2020 in [facility] Clinical Support from 08/11/2019 in [facility] Office Visit from 08/08/2018 in [facility] Clinical Support from 08/05/2018 in [facility]  SDOH Interventions       Food Insecurity Interventions Intervention Not Indicated -- -- -- --  Housing Interventions Intervention Not Indicated -- -- -- --  Transportation Interventions Intervention Not Indicated -- -- -- --  Depression Interventions/Treatment  -- -- -- Currently on Treatment --  [provider notified]  Financial Strain Interventions Intervention Not Indicated Intervention Not Indicated Other (Comment)  [pt has community resource information from previous C3 referral] -- --  Stress Interventions Intervention Not Indicated -- -- -- --  Social Connections Interventions Intervention Not Indicated -- -- -- --       Medication Assistance: None required.  Patient affirms current coverage meets needs.  Medication Access: Within the past 30 days, how often has patient missed a dose of medication? None Is a pillbox or other method used to improve adherence? Yes  Factors that may affect medication adherence? no barriers identified Are meds synced by current pharmacy? No  Are meds delivered by current pharmacy? No  Does patient experience delays in picking up medications due to transportation concerns? No   [facility] Services Reviewed: Is patient disadvantaged to use [facility]?: No  Current Rx insurance plan: [employer] Name and location of Current pharmacy:  [id number] - [place], [place] - [address] [place] [id number] Phone: [phone] Fax: [phone]  [facility] services reviewed with  patient today?: Yes  Patient requests to transfer care to [facility]?: No  Reason patient declined to change pharmacies: Loyalty to other pharmacy/Patient preference  Compliance/Adherence/Medication fill history: Care Gaps: DEXA - asked patient to schedule today.   Star-Rating Drugs: Medication:      Atorvastatin 40 mg      Last Fill: 04/16/2022 [facility] Medication:      Jardiance 10 mg         Last Fill: 04/19/2022 [facility]  Assessment/Plan  Hypertension (BP goal <140/90) -Controlled -Current treatment: Furosemide 20 mg daily  Metoprolol XL 50 mg twice daily  -Medications previously tried: NA  -Current home readings: Not monitoring regularly.  -Denies hypotensive/hypertensive symptoms -Recommended to continue current medication  Hyperlipidemia: (LDL goal < 100) -Uncontrolled -Current treatment: Atorvastatin 40 mg two times weekly -Medications previously tried: Pravastatin -Patient reports adherence to her atorvastatin, but does associate the medication with joint pain. She has tried multiple statins in the past, but is unsure if she has tried rosuvastatin.  -Recommend rechecking FLP at PCP follow-up. -Recommended to continue current medication  Osteoarthritis (Goal: Minimize pain) -Not ideally controlled -Current treatment  Topical Cream - pt unsure  -Medications previously tried: NA -Patient reports worsening joint pain that limits her from staying as active as she would like.  -Recommend Acetaminophen CR 650 mg twice daily.   Atrial Fibrillation (Goal: prevent stroke and major bleeding) -Controlled -CHADSVASC: 8 -Current treatment: Rate control: Metoprolol XL 50 mg twice daily  Rate control: Amiodarone 200 mg daily  Anticoagulation: Eliquis 5 mg twice daily  -Medications previously tried: NA  -Recommended to continue current medication  Osteopenia (Goal Maintain bone health) -Controlled -Last DEXA Scan: July 2019    T-Score femoral neck: -1.3  T-Score lumbar spine: -0.8  10-year probability of major osteoporotic fracture: 7.7%  10-year probability of hip fracture: 0.7% -Patient is not a candidate for pharmacologic treatment -Current treatment  Vitamin D3 + K2  -Medications previously tried: NA  -Recommend 1200 mg of calcium daily from dietary and supplemental sources. -Recheck DEXA  -Recommended to continue current medication  Follow Up Plan: Telephone follow up appointment with care management team member scheduled for:  11/21/2022 at 2:30 PM  Cheyenne AdasAlex Romero Letizia,PharmD, BCACP, CPP Clinical Pharmacist Practitioner  [facility] [phone]

## 2022-05-23 NOTE — Patient Instructions (Signed)
Visit Information It was great speaking with you today!  Please let me know if you have any questions about our visit.  Plan:  Let's get your bone density rechecked. Please reach out to [facility] at [phone] to schedule your bone density.  Try Acetaminophen Arthritis Strength (650 mg) for your joint pain. You can start with one tablet twice daily.   Print copy of patient instructions, educational materials, and care plan provided in person.  Telephone follow up appointment with pharmacy team member scheduled for:  [facility] Clinical Pharmacist Assistant [phone]   Angelena Sole, PharmD, Patsy [place], CPP  Clinical Pharmacist Practitioner  [facility] [phone]

## 2022-05-24 ENCOUNTER — Encounter: Payer: Medicare HMO | Admitting: *Deleted

## 2022-05-24 VITALS — Ht 69.5 in | Wt 228.1 lb

## 2022-05-24 DIAGNOSIS — Z952 Presence of prosthetic heart valve: Secondary | ICD-10-CM | POA: Diagnosis not present

## 2022-05-24 NOTE — Patient Instructions (Addendum)
Discharge Patient Instructions  Patient Details  Name: Melody Soto MRN: [id number] Date of Birth: 11/14/1950 Referring Provider:  Alba Cory, MD   Number of Visits: 30  Reason for Discharge:  Patient reached a stable level of exercise. Patient independent in their exercise. Patient has met program and personal goals.  Smoking History:  Social History   Tobacco Use  Smoking Status Never  Smokeless Tobacco Never    Diagnosis:  No diagnosis found.  Initial Exercise Prescription:  Initial Exercise Prescription - 02/28/22 1500       Date of Initial Exercise RX and Referring Provider   Date 02/28/22    Referring Provider Melody Peng MD      Oxygen   Maintain Oxygen Saturation 88% or higher      Treadmill   MPH 1    Grade 0    Minutes 15    METs 1.8      NuStep   Level 2    SPM 80    Minutes 15    METs 1.93      Biostep-RELP   Level 1    SPM 60    Minutes 15    METs 1.93      Track   Laps 16    Minutes 15    METs 1.87      Prescription Details   Frequency (times per week) 3    Duration Progress to 30 minutes of continuous aerobic without signs/symptoms of physical distress      Intensity   THRR 40-80% of Max Heartrate 97-132    Ratings of Perceived Exertion 11-13    Perceived Dyspnea 0-4      Progression   Progression Continue to progress workloads to maintain intensity without signs/symptoms of physical distress.      Resistance Training   Training Prescription Yes    Weight 3 lb    Reps 10-15             Discharge Exercise Prescription (Final Exercise Prescription Changes):  Exercise Prescription Changes - 05/24/22 1100       Response to Exercise   Blood Pressure (Admit) 124/78    Blood Pressure (Exit) 138/60    Heart Rate (Admit) 67 bpm    Heart Rate (Exercise) 88 bpm    Heart Rate (Exit) 70 bpm    Rating of Perceived Exertion (Exercise) 12    Symptoms none    Duration Continue with 30 min of aerobic exercise  without signs/symptoms of physical distress.    Intensity THRR unchanged      Progression   Progression Continue to progress workloads to maintain intensity without signs/symptoms of physical distress.    Average METs 2.65      Resistance Training   Training Prescription Yes    Weight 3 lb    Reps 10-15      Interval Training   Interval Training No      REL-XR   Level 5    Minutes 30    METs 2.7      Home Exercise Plan   Plans to continue exercise at Home (comment)    Frequency Add 2 additional days to program exercise sessions.    Initial Home Exercises Provided 05/17/22      Oxygen   Maintain Oxygen Saturation 88% or higher             Functional Capacity:  6 Minute Walk     Row Name 02/28/22 1538 05/24/22 1113  6 Minute Walk   Phase Initial Discharge    Distance 1040 feet 1245 feet    Distance % Change -- 19.71 %    Distance Feet Change -- 205 ft    Walk Time 6 minutes 6 minutes    # of Rest Breaks 0 0    MPH 1.97 2.36    METS 1.93 2.85    RPE 11 11    Perceived Dyspnea  0 --    VO2 Peak 6.75 9.97    Symptoms Yes (comment) Yes (comment)    Comments Leg Fatigue hip pain 4/10    Resting HR 62 bpm 75 bpm    Resting BP 118/70 124/62    Resting Oxygen Saturation  95 % --    Exercise Oxygen Saturation  during 6 min walk 95 % --    Max Ex. HR 93 bpm 123 bpm    Max Ex. BP 150/82 146/74    2 Minute Post BP 124/80 --             Nutrition & Weight - Outcomes:  Pre Biometrics - 02/28/22 1540       Pre Biometrics   Height 5' 9.5" (1.765 m)    Weight 219 lb 4.8 oz (99.5 kg)    Waist Circumference 36.5 inches    Hip Circumference 46 inches    Waist to Hip Ratio 0.79 %    BMI (Calculated) 31.93    Single Leg Stand 9.3 seconds   R            Post Biometrics - 05/24/22 1114        Post  Biometrics   Height 5' 9.5" (1.765 m)    Weight 228 lb 1.6 oz (103.5 kg)    Waist Circumference 36 inches    Hip Circumference 47 inches    Waist to  Hip Ratio 0.77 %    BMI (Calculated) 33.21    Single Leg Stand 5.6 seconds             Nutrition:  Nutrition Therapy & Goals - 05/17/22 1141       Nutrition Therapy   Diet Heart healthy, low Na    Drug/Food Interactions Statins/Certain Fruits    Protein (specify units) 120g    Fiber 25 grams    Whole Grain Foods 3 servings    Saturated Fats 12 max. grams    Fruits and Vegetables 8 servings/day    Sodium 2 grams      Personal Nutrition Goals   Nutrition Goal ST: include protein at each meal and snack to improve satiation with meals, include meals and snacks during the day to avoid feeling out of control around food LT: honor hunger, build meals using MyPlate guidelines and include protein, fiber, and healthy fats most of the time, limit saturated fat <12g/day, include 20-25g of fiber/day.    Comments 72 y.o. F admitted to cardiac rehab s/p AVR. PMHx includes HTN, paroxysmal a.fib, diverticulosis, cholelithiasis, stage II endometrial cancer, skin cancer. Reviewed relevant medications vit C, atorvastatin, furosemide, jardiance, magnesium, fish oil, potassium, vit D, vit E, zinc. PYP 57. Melody Soto reports that 3 years ago she was doing the keto diet and lost 60-75 lbs, but has since regained that weight. She reports intermittent fasting and eating what she wants at this time such as fried fish and chicken as her tastes have been altered since being in the hospital; Melody Soto reports feeling like she can still eat a lot of food at  one time, after rehab is craving doughnuts and fast food on the way home, and she has trouble with snacking at night - discussed how this could be due to her eating pattern and feelings of hunger, suggested even a snack before exercise like apple with peanut butter and eating satiating meals/snacks consistently during the day can help to curb this. Reviewed heart healthy eating, Melody Soto reports doing well and has no questions at this time.      Intervention Plan    Intervention Prescribe, educate and counsel regarding individualized specific dietary modifications aiming towards targeted core components such as weight, hypertension, lipid management, diabetes, heart failure and other comorbidities.    Expected Outcomes Short Term Goal: A plan has been developed with personal nutrition goals set during dietitian appointment.;Short Term Goal: Understand basic principles of dietary content, such as calories, fat, sodium, cholesterol and nutrients.;Long Term Goal: Adherence to prescribed nutrition plan.           Goals reviewed with patient; copy given to patient.

## 2022-05-24 NOTE — Progress Notes (Signed)
Daily Session Note  Patient Details  Name: Melody Soto MRN: [id number] Date of Birth: Jun 10, 1950 Referring Provider:   Flowsheet Row Cardiac Rehab from 02/28/2022 in [facility] Cardiac and Pulmonary Rehab  Referring Provider Dorothyann Peng MD       Encounter Date: 05/24/2022  Check In:  Session Check In - 05/24/22 1122       Check-In   Supervising physician immediately available to respond to emergencies See telemetry face sheet for immediately available ER MD    Location [facility]-Cardiac & Pulmonary Rehab    Staff Present Lanny Hurst, RN, ADN;Meredith Jewel Baize, RN BSN;Jessica Juanetta Gosling, MA, RCEP, CCRP, CCET;Joseph Brock[place], [place]    Virtual Visit No    Medication changes reported     No    Fall or balance concerns reported    No    Warm-up and Cool-down Performed on first and last piece of equipment    Resistance Training Performed Yes    VAD Patient? No    PAD/SET Patient? No      Pain Assessment   Currently in Pain? No/denies               Exercise Prescription Changes - 05/24/22 1100       Response to Exercise   Blood Pressure (Admit) 124/78    Blood Pressure (Exit) 138/60    Heart Rate (Admit) 67 bpm    Heart Rate (Exercise) 88 bpm    Heart Rate (Exit) 70 bpm    Rating of Perceived Exertion (Exercise) 12    Symptoms none    Duration Continue with 30 min of aerobic exercise without signs/symptoms of physical distress.    Intensity THRR unchanged      Progression   Progression Continue to progress workloads to maintain intensity without signs/symptoms of physical distress.    Average METs 2.65      Resistance Training   Training Prescription Yes    Weight 3 lb    Reps 10-15      Interval Training   Interval Training No      REL-XR   Level 5    Minutes 30    METs 2.7      Home Exercise Plan   Plans to continue exercise at Home (comment)    Frequency Add 2 additional days to program exercise sessions.    Initial Home Exercises Provided 05/17/22       Oxygen   Maintain Oxygen Saturation 88% or higher             Social History   Tobacco Use  Smoking Status Never  Smokeless Tobacco Never    Goals Met:  Independence with exercise equipment Exercise tolerated well No report of concerns or symptoms today Strength training completed today  Goals Unmet:  Not Applicable  Comments: Pt able to follow exercise prescription today without complaint.  Will continue to monitor for progression.    Dr. Bethann Punches is Medical Director for [facility] Cardiac Rehabilitation.  Dr. Vida Rigger is Medical Director for [facility] Pulmonary Rehabilitation.

## 2022-05-25 ENCOUNTER — Encounter: Payer: Medicare HMO | Admitting: *Deleted

## 2022-05-25 DIAGNOSIS — Z952 Presence of prosthetic heart valve: Secondary | ICD-10-CM | POA: Diagnosis not present

## 2022-05-25 NOTE — Progress Notes (Signed)
Cardiac Individual Treatment Plan  Patient Details  Name: BERNARDINE LANGWORTHY MRN: [id number] Date of Birth: [birth year] Referring Provider:   Flowsheet Row Cardiac Rehab from 02/28/2022 in [facility] Cardiac and Pulmonary Rehab  Referring Provider Dorothyann Peng MD       Initial Encounter Date:  Flowsheet Row Cardiac Rehab from 02/28/2022 in [facility] Cardiac and Pulmonary Rehab  Date 02/28/22       Visit Diagnosis: S/P aortic valve replacement  Patient's Home Medications on Admission:  Current Outpatient Medications:    amiodarone (PACERONE) 200 MG tablet, Take 1 tablet (200 mg total) by mouth daily., Disp: 30 tablet, Rfl: 0   Ascorbic Acid (VITAMIN C) 1000 MG tablet, Take 1,000 mg by mouth in the morning and at bedtime., Disp: , Rfl:    atorvastatin (LIPITOR) 40 MG tablet, TAKE 1 TABLET (40 MG TOTAL) BY MOUTH 2 (TWO) TIMES A WEEK., Disp: 24 tablet, Rfl: 1   Coenzyme Q10 100 MG TABS, Take 100 mg by mouth daily., Disp: , Rfl:    furosemide (LASIX) 20 MG tablet, Take 20 mg by mouth daily., Disp: , Rfl:    JARDIANCE 10 MG TABS tablet, Take 10 mg by mouth daily., Disp: , Rfl:    Magnesium 500 MG CAPS, Take 800 mg by mouth in the morning and at bedtime. , Disp: , Rfl:    metoprolol succinate (TOPROL-XL) 50 MG 24 hr tablet, Take 50 mg by mouth 2 (two) times daily., Disp: , Rfl:    Potassium 99 MG TABS, Take 99 mg by mouth in the morning and at bedtime., Disp: , Rfl:    potassium gluconate 595 (99 K) MG TABS tablet, Take 595 mg by mouth 2 (two) times daily., Disp: , Rfl:    Vitamin D-Vitamin K (VITAMIN K2-VITAMIN D3 PO), Take 2 capsules by mouth daily. 5000 units Vitamin D3 + 90 mcg of Vitamin K2, Disp: , Rfl:    vitamin E 200 UNIT capsule, Take 200 Units by mouth 2 (two) times daily., Disp: , Rfl:    zinc gluconate 50 MG tablet, Take 50 mg by mouth daily., Disp: , Rfl:   Past Medical History: Past Medical History:  Diagnosis Date   Actinic keratosis    Aortic stenosis    Cataract     Depression    Dysrhythmia    Endometrial cancer determined by uterine biopsy 10/29/2016   Rad tx's and internal brachytherapy.   Heart murmur    HX OF   Hyperlipidemia    Hypertension    CONTROLLED ON MEDS   Lymphedema    Melanoma    Left upper arm. 20 years ago   Shortness of breath dyspnea     Tobacco Use: Social History   Tobacco Use  Smoking Status Never  Smokeless Tobacco Never    Labs: Review Flowsheet  More data exists      Latest Ref Rng & Units 09/24/2019 12/21/2019 06/21/2020 12/23/2020 07/28/2021  Labs for ITP Cardiac and Pulmonary Rehab  Cholestrol <200 mg/dL - - 657  - 846   LDL (calc) mg/dL (calc) - - 962  - 952   HDL-C > OR = 50 mg/dL - - 59  - 65   Trlycerides <150 mg/dL - - 841  - 324   Hemoglobin A1c <5.7 % of total Hgb - 5.9  5.5  5.5  5.5   TCO2 22 - 32 mmol/L 25  - - - -     Exercise Target Goals: Exercise Program Goal: Individual exercise prescription  set using results from initial 6 min walk test and THRR while considering  patient's activity barriers and safety.   Exercise Prescription Goal: Initial exercise prescription builds to 30-45 minutes a day of aerobic activity, 2-3 days per week.  Home exercise guidelines will be given to patient during program as part of exercise prescription that the participant will acknowledge.   Education: Aerobic Exercise: - Group verbal and visual presentation on the components of exercise prescription. Introduces F.I.T.T principle from ACSM for exercise prescriptions.  Reviews F.I.T.T. principles of aerobic exercise including progression. Written material given at graduation. Flowsheet Row Cardiac Rehab from 05/24/2022 in [facility] Cardiac and Pulmonary Rehab  Education need identified 02/28/22  Date 05/10/22  Educator [facility]  Instruction Review Code 1- Verbalizes Understanding       Education: Resistance Exercise: - Group verbal and visual presentation on the components of exercise prescription. Introduces F.I.T.T  principle from ACSM for exercise prescriptions  Reviews F.I.T.T. principles of resistance exercise including progression. Written material given at graduation.    Education: Exercise & Equipment Safety: - Individual verbal instruction and demonstration of equipment use and safety with use of the equipment. Flowsheet Row Cardiac Rehab from 05/24/2022 in [facility] Cardiac and Pulmonary Rehab  Date 02/28/22  Educator NT  Instruction Review Code 1- Verbalizes Understanding       Education: Exercise Physiology & General Exercise Guidelines: - Group verbal and written instruction with models to review the exercise physiology of the cardiovascular system and associated critical values. Provides general exercise guidelines with specific guidelines to those with heart or lung disease.  Flowsheet Row Cardiac Rehab from 05/24/2022 in [facility] Cardiac and Pulmonary Rehab  Date 05/03/22  Educator [facility]  Instruction Review Code 1- Verbalizes Understanding       Education: Flexibility, Balance, Mind/Body Relaxation: - Group verbal and visual presentation with interactive activity on the components of exercise prescription. Introduces F.I.T.T principle from ACSM for exercise prescriptions. Reviews F.I.T.T. principles of flexibility and balance exercise training including progression. Also discusses the mind body connection.  Reviews various relaxation techniques to help reduce and manage stress (i.e. Deep breathing, progressive muscle relaxation, and visualization). Balance handout provided to take home. Written material given at graduation. Flowsheet Row Cardiac Rehab from 05/24/2022 in [facility] Cardiac and Pulmonary Rehab  Date 03/15/22  Educator [facility]  Instruction Review Code 1- Verbalizes Understanding       Activity Barriers & Risk Stratification:  Activity Barriers & Cardiac Risk Stratification - 02/28/22 1539       Activity Barriers & Cardiac Risk Stratification   Activity Barriers Arthritis;Other (comment)     Comments hip pain, lymphodema in legs   R>L    Cardiac Risk Stratification Low             6 Minute Walk:  6 Minute Walk     Row Name 02/28/22 1538 05/24/22 1113       6 Minute Walk   Phase Initial Discharge    Distance 1040 feet 1245 feet    Distance % Change -- 19.71 %    Distance Feet Change -- 205 ft    Walk Time 6 minutes 6 minutes    # of Rest Breaks 0 0    MPH 1.97 2.36    METS 1.93 2.85    RPE 11 11    Perceived Dyspnea  0 --    VO2 Peak 6.75 9.97    Symptoms Yes (comment) Yes (comment)    Comments Leg Fatigue hip pain 4/10  Resting HR 62 bpm 75 bpm    Resting BP 118/70 124/62    Resting Oxygen Saturation  95 % --    Exercise Oxygen Saturation  during 6 min walk 95 % --    Max Ex. HR 93 bpm 123 bpm    Max Ex. BP 150/82 146/74    2 Minute Post BP 124/80 --             Oxygen Initial Assessment:   Oxygen Re-Evaluation:   Oxygen Discharge (Final Oxygen Re-Evaluation):   Initial Exercise Prescription:  Initial Exercise Prescription - 02/28/22 1500       Date of Initial Exercise RX and Referring Provider   Date 02/28/22    Referring Provider Dorothyann Peng MD      Oxygen   Maintain Oxygen Saturation 88% or higher      Treadmill   MPH 1    Grade 0    Minutes 15    METs 1.8      NuStep   Level 2    SPM 80    Minutes 15    METs 1.93      Biostep-RELP   Level 1    SPM 60    Minutes 15    METs 1.93      Track   Laps 16    Minutes 15    METs 1.87      Prescription Details   Frequency (times per week) 3    Duration Progress to 30 minutes of continuous aerobic without signs/symptoms of physical distress      Intensity   THRR 40-80% of Max Heartrate 97-132    Ratings of Perceived Exertion 11-13    Perceived Dyspnea 0-4      Progression   Progression Continue to progress workloads to maintain intensity without signs/symptoms of physical distress.      Resistance Training   Training Prescription Yes    Weight 3 lb     Reps 10-15             Perform Capillary Blood Glucose checks as needed.  Exercise Prescription Changes:   Exercise Prescription Changes     Row Name 02/28/22 1500 03/19/22 1300 04/03/22 0800 04/16/22 1500 05/01/22 1300     Response to Exercise   Blood Pressure (Admit) 118/70 118/72 126/66 142/80 122/62   Blood Pressure (Exercise) 150/82 122/64 142/72 -- --   Blood Pressure (Exit) 124/80 100/62 124/76 116/72 112/64   Heart Rate (Admit) 62 bpm 57 bpm 63 bpm 68 bpm 74 bpm   Heart Rate (Exercise) 93 bpm 92 bpm 99 bpm 89 bpm 85 bpm   Heart Rate (Exit) 66 bpm 70 bpm 66 bpm 69 bpm 70 bpm   Oxygen Saturation (Admit) 95 % -- -- -- --   Oxygen Saturation (Exercise) 95 % -- -- -- --   Oxygen Saturation (Exit) 97 % -- -- -- --   Rating of Perceived Exertion (Exercise) 11 13 13 15 13    Perceived Dyspnea (Exercise) 0 -- -- -- --   Symptoms leg fatigue none none none none   Comments Results 2nd full week of exercise -- -- --   Duration -- Progress to 30 minutes of  aerobic without signs/symptoms of physical distress Progress to 30 minutes of  aerobic without signs/symptoms of physical distress Continue with 30 min of aerobic exercise without signs/symptoms of physical distress. Continue with 30 min of aerobic exercise without signs/symptoms of physical distress.   Intensity -- THRR  unchanged THRR unchanged THRR unchanged THRR unchanged     Progression   Progression -- Continue to progress workloads to maintain intensity without signs/symptoms of physical distress. Continue to progress workloads to maintain intensity without signs/symptoms of physical distress. Continue to progress workloads to maintain intensity without signs/symptoms of physical distress. Continue to progress workloads to maintain intensity without signs/symptoms of physical distress.   Average METs -- 2.33 2.35 3.68 3.37     Resistance Training   Training Prescription -- Yes Yes Yes Yes   Weight -- 3 lb 3 lb 3 lb 3 lb    Reps -- 10-15 10-15 10-15 10-15     Interval Training   Interval Training -- No No No No     Treadmill   MPH -- 1 -- -- --   Grade -- 0 -- -- --   Minutes -- 15 -- -- --   METs -- 1.8 -- -- --     NuStep   Level -- 3 3 -- 3   Minutes -- 15 30 -- 15   METs -- 2.8 2.9 -- 2.7     REL-XR   Level -- -- -- 4 4   Minutes -- -- -- 30 30   METs -- -- -- 4 4.3     Biostep-RELP   Level -- 3 3 2 2    Minutes -- 15 15 15 15    METs -- 3 2 3 3      Oxygen   Maintain Oxygen Saturation -- -- 88% or higher 88% or higher 88% or higher    Row Name 05/14/22 1300 05/17/22 1500 05/24/22 1100         Response to Exercise   Blood Pressure (Admit) 124/78 -- 124/78     Blood Pressure (Exit) 138/60 -- 138/60     Heart Rate (Admit) 67 bpm -- 67 bpm     Heart Rate (Exercise) 88 bpm -- 88 bpm     Heart Rate (Exit) 70 bpm -- 70 bpm     Rating of Perceived Exertion (Exercise) 12 -- 12     Symptoms none -- none     Duration Continue with 30 min of aerobic exercise without signs/symptoms of physical distress. -- Continue with 30 min of aerobic exercise without signs/symptoms of physical distress.     Intensity THRR unchanged -- THRR unchanged       Progression   Progression Continue to progress workloads to maintain intensity without signs/symptoms of physical distress. -- Continue to progress workloads to maintain intensity without signs/symptoms of physical distress.     Average METs 2.65 -- 2.65       Resistance Training   Training Prescription Yes -- Yes     Weight 3 lb -- 3 lb     Reps 10-15 -- 10-15       Interval Training   Interval Training No -- No       REL-XR   Level 5 -- 5     Minutes 30 -- 30     METs 2.7 -- 2.7       Home Exercise Plan   Plans to continue exercise at -- Home (comment) Home (comment)     Frequency -- Add 2 additional days to program exercise sessions. Add 2 additional days to program exercise sessions.     Initial Home Exercises Provided -- 05/17/22  05/17/22       Oxygen   Maintain Oxygen Saturation 88% or higher 88% or higher 88%  or higher              Exercise Comments:   Exercise Comments     Row Name 02/28/22 0931 03/06/22 1131 05/25/22 1124       Exercise Comments Virtual orientation call completed today. shehas an appointment on Date: 02/28/22 for EP eval and gym Orientation. Documentation of diagnosis can be found in [facility] Date: 12/07/2021 . 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 First full day of exercise!  Patient was oriented to gym and equipment including functions, settings, policies, and procedures.  Patient's individual exercise prescription and treatment plan were reviewed.  All starting workloads were established based on the results of the 6 minute walk test done at initial orientation visit.  The plan for exercise progression was also introduced and progression will be customized based on patient's performance and goals. Meaghann graduated today from  rehab with 35 sessions completed.  Details of the patient's exercise prescription and what She needs to do in order to continue the prescription and progress were discussed with patient.  Patient was given a copy of prescription and goals.  Patient verbalized understanding.  Bahar plans to continue to exercise by joining a gym with her [employer].              Exercise Goals and Review:   Exercise Goals     Row Name 02/28/22 1540             Exercise Goals   Increase Physical Activity Yes       Intervention Provide advice, education, support and counseling about physical activity/exercise needs.;Develop an individualized exercise prescription for aerobic and resistive training based on initial evaluation findings, risk stratification, comorbidities and participant's personal goals.       Expected Outcomes Short Term: Attend rehab on a regular basis to increase amount of  physical activity.;Long Term: Exercising regularly at least 3-5 days a week.;Long Term: Add in home exercise to make exercise part of routine and to increase amount of physical activity.       Increase Strength and Stamina Yes       Intervention Provide advice, education, support and counseling about physical activity/exercise needs.;Develop an individualized exercise prescription for aerobic and resistive training based on initial evaluation findings, risk stratification, comorbidities and participant's personal goals.       Expected Outcomes Short Term: Perform resistance training exercises routinely during rehab and add in resistance training at home;Short Term: Increase workloads from initial exercise prescription for resistance, speed, and METs.;Long Term: Improve cardiorespiratory fitness, muscular endurance and strength as measured by increased METs and functional capacity ( )       Able to understand and use rate of perceived exertion (RPE) scale Yes       Intervention Provide education and explanation on how to use RPE scale       Expected Outcomes Short Term: Able to use RPE daily in rehab to express subjective intensity level;Long Term:  Able to use RPE to guide intensity level when exercising independently       Able to understand and use Dyspnea scale Yes       Intervention Provide education and explanation on how to use Dyspnea scale       Expected Outcomes Short Term: Able to use Dyspnea scale daily in rehab to express subjective sense of shortness of breath during exertion;Long Term: Able to use Dyspnea scale to guide intensity  level when exercising independently       Knowledge and understanding of Target Heart Rate Range (THRR) Yes       Intervention Provide education and explanation of THRR including how the numbers were predicted and where they are located for reference       Expected Outcomes Short Term: Able to state/look up THRR;Long Term: Able to use THRR to govern intensity  when exercising independently;Short Term: Able to use daily as guideline for intensity in rehab       Able to check pulse independently Yes       Intervention Provide education and demonstration on how to check pulse in carotid and radial arteries.;Review the importance of being able to check your own pulse for safety during independent exercise       Expected Outcomes Short Term: Able to explain why pulse checking is important during independent exercise;Long Term: Able to check pulse independently and accurately       Understanding of Exercise Prescription Yes       Intervention Provide education, explanation, and written materials on patient's individual exercise prescription       Expected Outcomes Short Term: Able to explain program exercise prescription;Long Term: Able to explain home exercise prescription to exercise independently                Exercise Goals Re-Evaluation :  Exercise Goals Re-Evaluation     Row Name 03/06/22 1131 03/19/22 1328 04/03/22 0841 04/12/22 1133 04/16/22 1504     Exercise Goal Re-Evaluation   Exercise Goals Review Increase Physical Activity;Able to understand and use rate of perceived exertion (RPE) scale;Knowledge and understanding of Target Heart Rate Range (THRR);Understanding of Exercise Prescription;Increase Strength and Stamina;Able to check pulse independently Increase Physical Activity;Understanding of Exercise Prescription;Increase Strength and Stamina Increase Physical Activity;Understanding of Exercise Prescription;Increase Strength and Stamina Increase Physical Activity;Understanding of Exercise Prescription;Increase Strength and Stamina Increase Physical Activity;Understanding of Exercise Prescription;Increase Strength and Stamina   Comments Reviewed RPE scale, THR and program prescription with pt today.  Pt voiced understanding and was given a copy of goals to take home. [name] is doing well for the first couple of weeks she has been at rehab. She  has exercised at her initial exercise prescription well, working at levels 3 on both the T4 Nustep and Biostep. She walked 1 time at  a 1.0 mph on the treadmill and will encourage her to walk more as we further get into the program. Her RPEs are in appropriate range. Will continue to monitor. [name] continues to do well in rehab. She recently increased her overall average MET level to 2.35 METs. She also has continued to do well on seated machines at level 3 on both the T4 Nustep and the biostep. She has not done any walking since the last review. We will continue to monitor her progress in the program. [name] reports stretching and doing balance work sometimes. Discussed importance of exercise outside of rehab including aerobic and resistance exercise. She has not met with EP to discuss home exercise yet. [name] continues to do well in rehab. She is very limited with walking and has not participated in it yet due to some hip pain and lymphedema in her legs.She is doing well on seated machines working at an increase to level 4 on the XR. She could benefit from increasing to level 3 on the Biostep and moving up to 4 lbs for handweights. Will continue to monitor.   Expected Outcomes Short: Use RPE daily  to regulate intensity.  Long: Follow program prescription in THR. Short: Follow exercise prescription, push to walk the treadmill more often Long: Build up overall strength and stamina Short: Return to walking the treadmill. Long: Continue to improve strength and stamina. Short: EP to go over home exercise Long: Continue to improve strength and stamina. Short: Increase to level 3 on Biostep, increase tp 4 lb handweights Long: continue to increase overall MET level and stamina    Row Name 05/01/22 1327 05/08/22 1122 05/14/22 1306 05/17/22 1507       Exercise Goal Re-Evaluation   Exercise Goals Review Increase Physical Activity;Understanding of Exercise Prescription;Increase Strength and Stamina Increase Physical  Activity;Understanding of Exercise Prescription;Increase Strength and Stamina Increase Physical Activity;Understanding of Exercise Prescription;Increase Strength and Stamina Increase Physical Activity;Understanding of Exercise Prescription;Increase Strength and Stamina;Knowledge and understanding of Target Heart Rate Range (THRR);Able to understand and use rate of perceived exertion (RPE) scale;Able to check pulse independently;Able to understand and use Dyspnea scale    Comments [name] is doing well in rehab. She has continued to work at an average overall MET level above 3.3 METs. She also has stayed consistent with her workloads at level 2 on the biostep, level 3 on the T4 nustep, and level 4 on the XR. She has not done any walking on the track or treadmill since the last review. She also has continued to use 3 lb hand weights for resistance training. We will continue to monitor her progress in the program. [name] is doing well in rehab.  She is feeling good overall.  She has signed up for [employer] and trying to decide where she wants to go with it.  She is considering the [facility] as an option. She is looking at a place she can do water aerobics and walking too.  She is looking forward to using her membership. [name] continues to do well in rehab. She has only worked on the XR these last reviews as she is very limited with walking. Staff will encourage patient to switch up to a different seated machine to get a variety as she is not quite reaching her THR. She did, however, increase to level 5 on the XR. She is due for a post and hope to see improvement. Reviewed home exercise with pt today.  Pt plans to do seated aerobic exercise for exercise. She also joined [employer] and is looking for a gym. Patient is limited with walking due to hip pain. She also participates in doing OT exercises and stretching.  Reviewed THR, pulse, RPE, sign and symptoms, pulse oximetery and when to call 911 or MD.  Also  discussed weather considerations and indoor options.  Pt voiced understanding.    Expected Outcomes Short: Progressively increase workloads on seated machines, return to walking. Long: Continue to improve strength and stamina. Short: Decide on location for exercise Long: Continue to exercise independently Short: Improve on post Long: Continue to increase overall MET level and stamina Short: Continue working on aerobic exercise, minimum 30 minutes as tolerated with hip Long: Continue to exercise independently             Discharge Exercise Prescription (Final Exercise Prescription Changes):  Exercise Prescription Changes - 05/24/22 1100       Response to Exercise   Blood Pressure (Admit) 124/78    Blood Pressure (Exit) 138/60    Heart Rate (Admit) 67 bpm    Heart Rate (Exercise) 88 bpm    Heart Rate (Exit) 70  bpm    Rating of Perceived Exertion (Exercise) 12    Symptoms none    Duration Continue with 30 min of aerobic exercise without signs/symptoms of physical distress.    Intensity THRR unchanged      Progression   Progression Continue to progress workloads to maintain intensity without signs/symptoms of physical distress.    Average METs 2.65      Resistance Training   Training Prescription Yes    Weight 3 lb    Reps 10-15      Interval Training   Interval Training No      REL-XR   Level 5    Minutes 30    METs 2.7      Home Exercise Plan   Plans to continue exercise at Home (comment)    Frequency Add 2 additional days to program exercise sessions.    Initial Home Exercises Provided 05/17/22      Oxygen   Maintain Oxygen Saturation 88% or higher             Nutrition:  Target Goals: Understanding of nutrition guidelines, daily intake of sodium 1500mg , cholesterol 200mg , calories 30% from fat and 7% or less from saturated fats, daily to have 5 or more servings of fruits and vegetables.  Education: All About Nutrition: -Group instruction provided by  verbal, written material, interactive activities, discussions, models, and posters to present general guidelines for heart healthy nutrition including fat, fiber, MyPlate, the role of sodium in heart healthy nutrition, utilization of the nutrition label, and utilization of this knowledge for meal planning. Follow up email sent as well. Written material given at graduation. Flowsheet Row Cardiac Rehab from 05/24/2022 in [facility] Cardiac and Pulmonary Rehab  Date 05/24/22  Educator [facility]  Instruction Review Code 1- Verbalizes Understanding       Biometrics:  Pre Biometrics - 02/28/22 1540       Pre Biometrics   Height 5' 9.5" (1.765 m)    Weight 219 lb 4.8 oz (99.5 kg)    Waist Circumference 36.5 inches    Hip Circumference 46 inches    Waist to Hip Ratio 0.79 %    BMI (Calculated) 31.93    Single Leg Stand 9.3 seconds   R            Post Biometrics - 05/24/22 1114        Post  Biometrics   Height 5' 9.5" (1.765 m)    Weight 228 lb 1.6 oz (103.5 kg)    Waist Circumference 36 inches    Hip Circumference 47 inches    Waist to Hip Ratio 0.77 %    BMI (Calculated) 33.21    Single Leg Stand 5.6 seconds             Nutrition Therapy Plan and Nutrition Goals:  Nutrition Therapy & Goals - 05/17/22 1141       Nutrition Therapy   Diet Heart healthy, low Na    Drug/Food Interactions Statins/Certain Fruits    Protein (specify units) 120g    Fiber 25 grams    Whole Grain Foods 3 servings    Saturated Fats 12 max. grams    Fruits and Vegetables 8 servings/day    Sodium 2 grams      Personal Nutrition Goals   Nutrition Goal ST: include protein at each meal and snack to improve satiation with meals, include meals and snacks during the day to avoid feeling out of control around food LT: honor hunger, build  meals using MyPlate guidelines and include protein, fiber, and healthy fats most of the time, limit saturated fat <12g/day, include 20-25g of fiber/day.    Comments 72 y.o. F  admitted to cardiac rehab s/p AVR. PMHx includes HTN, paroxysmal a.fib, diverticulosis, cholelithiasis, stage II endometrial cancer, skin cancer. Reviewed relevant medications vit C, atorvastatin, furosemide, jardiance, magnesium, fish oil, potassium, vit D, vit E, zinc. PYP 57. [name] reports that 3 years ago she was doing the keto diet and lost 60-75 lbs, but has since regained that weight. She reports intermittent fasting and eating what she wants at this time such as fried fish and chicken as her tastes have been altered since being in the hospital; [name] reports feeling like she can still eat a lot of food at one time, after rehab is craving doughnuts and fast food on the way home, and she has trouble with snacking at night - discussed how this could be due to her eating pattern and feelings of hunger, suggested even a snack before exercise like apple with peanut butter and eating satiating meals/snacks consistently during the day can help to curb this. Reviewed heart healthy eating, [name] reports doing well and has no questions at this time.      Intervention Plan   Intervention Prescribe, educate and counsel regarding individualized specific dietary modifications aiming towards targeted core components such as weight, hypertension, lipid management, diabetes, heart failure and other comorbidities.    Expected Outcomes Short Term Goal: A plan has been developed with personal nutrition goals set during dietitian appointment.;Short Term Goal: Understand basic principles of dietary content, such as calories, fat, sodium, cholesterol and nutrients.;Long Term Goal: Adherence to prescribed nutrition plan.             Nutrition Assessments:  MEDIFICTS Score Key: ?70 Need to make dietary changes  40-70 Heart Healthy Diet ? 40 Therapeutic Level Cholesterol Diet  Flowsheet Row Cardiac Rehab from 02/28/2022 in [facility] Cardiac and Pulmonary Rehab  Picture Your Plate Total Score on Admission 57       Picture Your Plate Scores: <16 Unhealthy dietary pattern with much room for improvement. 41-50 Dietary pattern unlikely to meet recommendations for good health and room for improvement. 51-60 More healthful dietary pattern, with some room for improvement.  >60 Healthy dietary pattern, although there may be some specific behaviors that could be improved.    Nutrition Goals Re-Evaluation:  Nutrition Goals Re-Evaluation     Row Name 03/22/22 1118 04/12/22 1156 05/08/22 1132         Goals   Comment Patient deferrs dietitian appointment. Will follow up with patient. [name] reports that 3 years ago she was doing the keto diet and lost 60-75 lbs, but has since regained that weight. She reports intermittent fasting and eating what she wants at this time such as fried fish and chicken as her tastes have been altered since being in the hospital; [name] reports feeling like she can still eat a lot of food at one time, after rehab is craving doughnuts and fast food on the way home, and she has trouble with snacking at night - discussed how this could be due to her eating pattern and feelings of hunger, suggested even a snack before exercise like apple with peanut butter and eating satiating meals/snacks consistently during the day can help to curb this. [name] scheduled nutrition appt. with RD 3/13 at 130pm. [name] is doing well with her diet. She is working on cutting back on sugar to help with  her inflammation.  She is trying to get better about not snacking.  She has stopped by junk food.  She is trying to eat more fruit and cheese to help.  She is also trying to add in more protein.  She did well on keto but got tired of that type of eating.  She is eating more salads now too.  She is working on portion control and wants to try to cut back on how much she is eating.     Expected Outcome -- -- Short: Continue to add in more protein Long: conitnue to focus on portion control              Nutrition  Goals Discharge (Final Nutrition Goals Re-Evaluation):  Nutrition Goals Re-Evaluation - 05/08/22 1132       Goals   Comment [name] is doing well with her diet. She is working on cutting back on sugar to help with her inflammation.  She is trying to get better about not snacking.  She has stopped by junk food.  She is trying to eat more fruit and cheese to help.  She is also trying to add in more protein.  She did well on keto but got tired of that type of eating.  She is eating more salads now too.  She is working on portion control and wants to try to cut back on how much she is eating.    Expected Outcome Short: Continue to add in more protein Long: conitnue to focus on portion control             Psychosocial: Target Goals: Acknowledge presence or absence of significant depression and/or stress, maximize coping skills, provide positive support system. Participant is able to verbalize types and ability to use techniques and skills needed for reducing stress and depression.   Education: Stress, Anxiety, and Depression - Group verbal and visual presentation to define topics covered.  Reviews how body is impacted by stress, anxiety, and depression.  Also discusses healthy ways to reduce stress and to treat/manage anxiety and depression.  Written material given at graduation. Flowsheet Row Cardiac Rehab from 05/24/2022 in [facility] Cardiac and Pulmonary Rehab  Date 04/26/22  Educator [facility]  Instruction Review Code 1- [employer] Understanding       Education: Sleep Hygiene -Provides group verbal and written instruction about how sleep can affect your health.  Define sleep hygiene, discuss sleep cycles and impact of sleep habits. Review good sleep hygiene tips.    Initial Review & Psychosocial Screening:  Initial Psych Review & Screening - 02/28/22 0916       Initial Review   Current issues with None Identified      Family Dynamics   Good Support System? Yes   friends, daughter      Barriers   Psychosocial barriers to participate in program There are no identifiable barriers or psychosocial needs.      Screening Interventions   Interventions Encouraged to exercise;To provide support and resources with identified psychosocial needs;Provide feedback about the scores to participant    Expected Outcomes Short Term goal: Utilizing psychosocial counselor, staff and physician to assist with identification of specific Stressors or current issues interfering with healing process. Setting desired goal for each stressor or current issue identified.;Long Term Goal: Stressors or current issues are controlled or eliminated.;Short Term goal: Identification and review with participant of any Quality of Life or Depression concerns found by scoring the questionnaire.;Long Term goal: The participant improves quality of Life and  PHQ9 Scores as seen by post scores and/or verbalization of changes             Quality of Life Scores:   Quality of Life - 02/28/22 1158       Quality of Life   Select Quality of Life      Quality of Life Scores   Health/Function Pre 20.6 %    Socioeconomic Pre 22.31 %    Psych/Spiritual Pre 28.93 %    Family Pre 23.4 %    GLOBAL Pre 23.09 %            Scores of 19 and below usually indicate a poorer quality of life in these areas.  A difference of  2-3 points is a clinically meaningful difference.  A difference of 2-3 points in the total score of the Quality of Life Index has been associated with significant improvement in overall quality of life, self-image, physical symptoms, and general health in studies assessing change in quality of life.  PHQ-9: Review Flowsheet  More data exists      05/15/2022 02/28/2022 02/23/2022 01/31/2022 12/01/2021  Depression screen PHQ 2/9  Decreased Interest 0 0 0 0 1  Down, Depressed, Hopeless 0 0 0 0 0  PHQ - 2 Score 0 0 0 0 1  Altered sleeping 0 0 0 0 0  Tired, decreased energy 0 0 0 0 3  Change in appetite 0 0 0  0 0  Feeling bad or failure about yourself  0 0 0 0 0  Trouble concentrating 0 0 0 0 0  Moving slowly or fidgety/restless 0 0 0 0 0  Suicidal thoughts 0 0 0 0 0  PHQ-9 Score 0 0 0 0 4  Difficult doing work/chores - Not difficult at all - - -   Interpretation of Total Score  Total Score Depression Severity:  1-4 = Minimal depression, 5-9 = Mild depression, 10-14 = Moderate depression, 15-19 = Moderately severe depression, 20-27 = Severe depression   Psychosocial Evaluation and Intervention:  Psychosocial Evaluation - 02/28/22 0932       Psychosocial Evaluation & Interventions   Interventions Encouraged to exercise with the program and follow exercise prescription    Comments [name] has no barriers to starting the program.  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 She has a great support system, friends and her daughter.  [name] stayed at her friends home for 3 weeks after discharge. She has completed her PT OT at home and is ready to continue working on gaining strength and stamina back.  She lives alone and has one cat at home. She is a retired Runner, broadcasting/film/video.    Expected Outcomes STG [name] is able to attend all scheduled sessions and progress with her exercise prescription LTG [name] continues with her exercise progression after discharge    Continue Psychosocial Services  Follow up required by staff             Psychosocial Re-Evaluation:  Psychosocial Re-Evaluation     Row Name 03/22/22 1120 04/12/22 1138 05/08/22 1125         Psychosocial Re-Evaluation   Current issues with None Identified None Identified None Identified     Comments Patient reports no issues with their current mental states, sleep, stress, depression or anxiety. Will follow up with patient in a few weeks for any changes. 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 She relies on her bible group,  friends, and daughters for support. [name] reports that  she has been sleeping well recently and is unsure why, suggested that if its been about a month that it has improved, it could very likely be from coming to rehab and exercising. [name] is doing well in rehab.  She is feeling good mentally.  She continues to enjoy her bible study and friends for support.  She is sleeping okay, she has good days and bad days.  She has frequent trips to bath room and her cat will wake her playing on occasion too.     Expected Outcomes Short: Continue to exercise regularly to support mental health and notify staff of any changes. Long: maintain mental health and well being through teaching of rehab or prescribed medications independently. Short: Continue to exercise regularly to support mental health and notify staff of any changes. Long: maintain mental health and well being through teaching of rehab or prescribed medications independently. Short: Continue to work on sleep Long: Continue to stay positive     Interventions Encouraged to attend Cardiac Rehabilitation for the exercise Encouraged to attend Cardiac Rehabilitation for the exercise Encouraged to attend Cardiac Rehabilitation for the exercise     Continue Psychosocial Services  Follow up required by staff Follow up required by staff --              Psychosocial Discharge (Final Psychosocial Re-Evaluation):  Psychosocial Re-Evaluation - 05/08/22 1125       Psychosocial Re-Evaluation   Current issues with None Identified    Comments [name] is doing well in rehab.  She is feeling good mentally.  She continues to enjoy her bible study and friends for support.  She is sleeping okay, she has good days and bad days.  She has frequent trips to bath room and her cat will wake her playing on occasion too.    Expected Outcomes Short: Continue to work on sleep Long: Continue to stay positive    Interventions Encouraged to attend Cardiac Rehabilitation for the exercise             Vocational  Rehabilitation: Provide vocational rehab assistance to qualifying candidates.   Vocational Rehab Evaluation & Intervention:  Vocational Rehab - 02/28/22 0919       Initial Vocational Rehab Evaluation & Intervention   Assessment shows need for Vocational Rehabilitation No      Vocational Rehab Re-Evaulation   Comments retired             Education: Education Goals: Education classes will be provided on a variety of topics geared toward better understanding of heart health and risk factor modification. Participant will state understanding/return demonstration of topics presented as noted by education test scores.  Learning Barriers/Preferences:  Learning Barriers/Preferences - 02/28/22 0919       Learning Barriers/Preferences   Learning Barriers None    Learning Preferences Audio             General Cardiac Education Topics:  AED/CPR: - Group verbal and written instruction with the use of models to demonstrate the basic use of the AED with the basic ABC's of resuscitation.   Anatomy and Cardiac Procedures: - Group verbal and visual presentation and models provide information about basic cardiac anatomy and function. Reviews the testing methods done to diagnose heart disease and the outcomes of the test results. Describes the treatment choices: Medical Management, Angioplasty, or Coronary Bypass Surgery for treating various heart conditions including Myocardial Infarction, Angina, Valve Disease, and Cardiac Arrhythmias.  Written  material given at graduation. Flowsheet Row Cardiac Rehab from 05/24/2022 in [facility] Cardiac and Pulmonary Rehab  Date 03/22/22  Educator SB  Instruction Review Code 1- Verbalizes Understanding       Medication Safety: - Group verbal and visual instruction to review commonly prescribed medications for heart and lung disease. Reviews the medication, class of the drug, and side effects. Includes the steps to properly store meds and maintain the  prescription regimen.  Written material given at graduation.   Intimacy: - Group verbal instruction through game format to discuss how heart and lung disease can affect sexual intimacy. Written material given at graduation.. Flowsheet Row Cardiac Rehab from 05/24/2022 in [facility] Cardiac and Pulmonary Rehab  Date 05/10/22  Educator [facility]  Instruction Review Code 1- Verbalizes Understanding       Know Your Numbers and Heart Failure: - Group verbal and visual instruction to discuss disease risk factors for cardiac and pulmonary disease and treatment options.  Reviews associated critical values for Overweight/Obesity, Hypertension, Cholesterol, and Diabetes.  Discusses basics of heart failure: signs/symptoms and treatments.  Introduces Heart Failure Zone chart for action plan for heart failure.  Written material given at graduation. Flowsheet Row Cardiac Rehab from 05/24/2022 in [facility] Cardiac and Pulmonary Rehab  Date 04/12/22  Educator [facility]  Instruction Review Code 1- Verbalizes Understanding       Infection Prevention: - Provides verbal and written material to individual with discussion of infection control including proper hand washing and proper equipment cleaning during exercise session. Flowsheet Row Cardiac Rehab from 05/24/2022 in [facility] Cardiac and Pulmonary Rehab  Date 02/28/22  Educator NT  Instruction Review Code 1- Verbalizes Understanding       Falls Prevention: - Provides verbal and written material to individual with discussion of falls prevention and safety. Flowsheet Row Cardiac Rehab from 05/24/2022 in [facility] Cardiac and Pulmonary Rehab  Date 02/28/22  Educator SB  Instruction Review Code 1- Verbalizes Understanding       Other: -Provides group and verbal instruction on various topics (see comments)   Knowledge Questionnaire Score:  Knowledge Questionnaire Score - 02/28/22 1159       Knowledge Questionnaire Score   Pre Score 25/26   exercise             Core  Components/Risk Factors/Patient Goals at Admission:  Personal Goals and Risk Factors at Admission - 02/28/22 1532       Core Components/Risk Factors/Patient Goals on Admission    Weight Management Yes    Intervention Weight Management: Develop a combined nutrition and exercise program designed to reach desired caloric intake, while maintaining appropriate intake of nutrient and fiber, sodium and fats, and appropriate energy expenditure required for the weight goal.;Weight Management: Provide education and appropriate resources to help participant work on and attain dietary goals.    Admit Weight 219 lb 4.8 oz (99.5 kg)    Goal Weight: Short Term 215 lb (97.5 kg)    Goal Weight: Long Term 210 lb (95.3 kg)    Expected Outcomes Short Term: Continue to assess and modify interventions until short term weight is achieved;Long Term: Adherence to nutrition and physical activity/exercise program aimed toward attainment of established weight goal;Weight Maintenance: Understanding of the daily nutrition guidelines, which includes 25-35% calories from fat, 7% or less cal from saturated fats, less than 200mg  cholesterol, less than 1.5gm of sodium, & 5 or more servings of fruits and vegetables daily    Hypertension Yes    Intervention Provide education on lifestyle modifcations  including regular physical activity/exercise, weight management, moderate sodium restriction and increased consumption of fresh fruit, vegetables, and low fat dairy, alcohol moderation, and smoking cessation.;Monitor prescription use compliance.    Expected Outcomes Short Term: Continued assessment and intervention until BP is < 140/55mm HG in hypertensive participants. < 130/72mm HG in hypertensive participants with diabetes, heart failure or chronic kidney disease.;Long Term: Maintenance of blood pressure at goal levels.    Lipids Yes    Intervention Provide education and support for participant on nutrition & aerobic/resistive exercise  along with prescribed medications to achieve LDL 70mg , HDL >40mg .    Expected Outcomes Short Term: Participant states understanding of desired cholesterol values and is compliant with medications prescribed. Participant is following exercise prescription and nutrition guidelines.;Long Term: Cholesterol controlled with medications as prescribed, with individualized exercise RX and with personalized nutrition plan. Value goals: LDL < 70mg , HDL > 40 mg.             Education:Diabetes - Individual verbal and written instruction to review signs/symptoms of diabetes, desired ranges of glucose level fasting, after meals and with exercise. Acknowledge that pre and post exercise glucose checks will be done for 3 sessions at entry of program.   Core Components/Risk Factors/Patient Goals Review:   Goals and Risk Factor Review     Row Name 03/22/22 1114 04/12/22 1114 05/08/22 1130         Core Components/Risk Factors/Patient Goals Review   Personal Goals Review Weight Management/Obesity Weight Management/Obesity;Lipids;Hypertension Weight Management/Obesity;Lipids;Hypertension     Review Deb wants to reach a weight goal of under 200 pounds. She feels like her appetite has increased since she has some energy back. Her doctor took her off lasix for a moment but it was not working out for her due to weight increase that was most likely fluid. [name] reports doing well in rehab and feels better when she exercises, but she doesn't always want to come. She made an appointment with RD  to discuss nutrition. [name] reports that she weighs herself daily to check on fluid retention, however, she is not checking her BP at this time; encourgaed to check BP at least 2x/week when not at rehab. [name] reports having difficulty starting to make changes; discussed how habits can be easier to add in when paired with other habits. [name] is doing well in rehab.  Her weight has been going up.  She has been eating more.  She  has been avoiding sugar to help with joints.  She is tring to weigh daily and use her lasix to help.  Her pressures are doing well.  She still wants to work on weight loss and hopes that exercising more will help.     Expected Outcomes Short: lose a few pounds in a couple weeks. Long: reach weight goal of under 200 pounds. ST: check BP at least 2x/week when not at rehab, meet with RD LT: monitor risk factors independently Short: Continue to work on weight loss Long; Continue to monitor risk factors              Core Components/Risk Factors/Patient Goals at Discharge (Final Review):   Goals and Risk Factor Review - 05/08/22 1130       Core Components/Risk Factors/Patient Goals Review   Personal Goals Review Weight Management/Obesity;Lipids;Hypertension    Review [name] is doing well in rehab.  Her weight has been going up.  She has been eating more.  She has been avoiding sugar to help with joints.  She is  tring to weigh daily and use her lasix to help.  Her pressures are doing well.  She still wants to work on weight loss and hopes that exercising more will help.    Expected Outcomes Short: Continue to work on weight loss Long; Continue to monitor risk factors             ITP Comments:  ITP Comments     Row Name 02/28/22 0930 02/28/22 1531 03/06/22 1131 03/07/22 0958 04/04/22 1428   ITP Comments Virtual orientation call completed today. shehas an appointment on Date: 02/28/22 for EP eval and gym Orientation.  Documentation of diagnosis can be found in [facility] Date: 12/07/2021 .     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 Completed and gym orientation. Initial ITP created and sent for review to Dr. Bethann Punches, Medical Director. First full day of exercise!  Patient was oriented to gym and equipment including functions, settings, policies, and procedures.  Patient's individual exercise prescription and  treatment plan were reviewed.  All starting workloads were established based on the results of the 6 minute walk test done at initial orientation visit.  The plan for exercise progression was also introduced and progression will be customized based on patient's performance and goals. 30 Day review completed. Medical Director ITP review done, changes made as directed, and signed approval by Medical Director.   new to program 30 day review completed. ITP sent to Dr. Bethann Punches, Medical Director of Cardiac Rehab. Continue with ITP unless changes are made by physician.    Row Name 05/02/22 0916 05/25/22 1124         ITP Comments 30 Day review completed. Medical Director ITP review done, changes made as directed, and signed approval by Medical Director. Candence graduated today from  rehab with 35 sessions completed.  Details of the patient's exercise prescription and what She needs to do in order to continue the prescription and progress were discussed with patient.  Patient was given a copy of prescription and goals.  Patient verbalized understanding.  Reganne plans to continue to exercise by joining a gym with her [employer].               Comments: Discharge ITP

## 2022-05-25 NOTE — Progress Notes (Signed)
Daily Session Note  Patient Details  Name: Melody Soto MRN: [id number] Date of Birth: [birth year] Referring Provider:   Flowsheet Row Cardiac Rehab from 02/28/2022 in [facility] Cardiac and Pulmonary Rehab  Referring Provider Melody Peng MD       Encounter Date: 05/25/2022  Check In:  Session Check In - 05/25/22 1121       Check-In   Supervising physician immediately available to respond to emergencies See telemetry face sheet for immediately available ER MD    Location [facility]-Cardiac & Pulmonary Rehab    Staff Present Melody Soto, RCP,RRT,BSRT;Melody Ramus, RN, BSN;Melody Pegues, MA, RCEP, CCRP, CCET    Virtual Visit No    Medication changes reported     No    Fall or balance concerns reported    No    Tobacco Cessation No Change    Warm-up and Cool-down Performed on first and last piece of equipment    Resistance Training Performed Yes    VAD Patient? No    PAD/SET Patient? No      Pain Assessment   Currently in Pain? No/denies                Social History   Tobacco Use  Smoking Status Never  Smokeless Tobacco Never    Goals Met:  Independence with exercise equipment Exercise tolerated well No report of concerns or symptoms today Strength training completed today  Goals Unmet:  Not Applicable  Comments: Pt able to follow exercise prescription today without complaint.  Will continue to monitor for progression.   Melody Soto graduated today from  rehab with 35 sessions completed.  Details of the patient's exercise prescription and what She needs to do in order to continue the prescription and progress were discussed with patient.  Patient was given a copy of prescription and goals.  Patient verbalized understanding.  Analyce plans to continue to exercise by joining a gym with her [employer].   Dr. Bethann Soto is Medical Director for [facility] Cardiac Rehabilitation.  Dr. Vida Soto is Medical Director for [facility] Pulmonary  Rehabilitation.

## 2022-05-25 NOTE — Progress Notes (Signed)
Discharge Summary: Melody Soto December 05, 1950   Melody Soto graduated today from  rehab with 35 sessions completed.  Details of the patient's exercise prescription and what she needs to do in order to continue the prescription and progress were discussed with patient.  Patient was given a copy of prescription and goals.  Patient verbalized understanding. [name] plans to continue to exercise by using her [employer] at a gym.

## 2022-08-07 ENCOUNTER — Telehealth: Payer: Self-pay | Admitting: *Deleted

## 2022-08-07 NOTE — Progress Notes (Signed)
  Care Coordination  Outreach Note  08/07/2022 Name: ARTEMIS KOLLER MRN: [id number] DOB: 04/07/1950   Care Coordination Outreach Attempts: An unsuccessful telephone outreach was attempted today to offer the patient information about available care coordination services.  Follow Up Plan:  Additional outreach attempts will be made to offer the patient care coordination information and services.   Encounter Outcome:  No Answer  Christie Nottingham  Care Coordination Care Guide  Direct Dial: [phone]

## 2022-08-14 NOTE — Progress Notes (Signed)
  Care Coordination  Outreach Note  08/14/2022 Name: OLUWANIFEMI PELISSIER MRN: [id number] DOB: Mar 17, 1950   Care Coordination Outreach Attempts: A second unsuccessful outreach was attempted today to offer the patient with information about available care coordination services.  Follow Up Plan:  Additional outreach attempts will be made to offer the patient care coordination information and services.   Encounter Outcome:  No Answer  Gwenevere Ghazi  Care Coordination Care Guide  Direct Dial: [phone]

## 2022-08-17 NOTE — Progress Notes (Signed)
  Care Coordination  Outreach Note  08/17/2022 Name: JUSTUS STREBE MRN: [id number] DOB: Apr 03, 1950   Care Coordination Outreach Attempts: A third unsuccessful outreach was attempted today to offer the patient with information about available care coordination services.  Follow Up Plan:  No further outreach attempts will be made at this time. We have been unable to contact the patient to offer or enroll patient in care coordination services  Encounter Outcome:  No Answer  Gwenevere Ghazi  Care Coordination Care Guide  Direct Dial: [phone]

## 2022-08-29 ENCOUNTER — Inpatient Hospital Stay: Payer: Medicare HMO | Attending: Obstetrics and Gynecology | Admitting: Obstetrics and Gynecology

## 2022-08-29 VITALS — BP 140/64 | HR 61 | Temp 96.6°F | Resp 18 | Wt 241.0 lb

## 2022-08-29 DIAGNOSIS — R918 Other nonspecific abnormal finding of lung field: Secondary | ICD-10-CM | POA: Insufficient documentation

## 2022-08-29 DIAGNOSIS — Z923 Personal history of irradiation: Secondary | ICD-10-CM | POA: Insufficient documentation

## 2022-08-29 DIAGNOSIS — Z8542 Personal history of malignant neoplasm of other parts of uterus: Secondary | ICD-10-CM | POA: Insufficient documentation

## 2022-08-29 DIAGNOSIS — Z08 Encounter for follow-up examination after completed treatment for malignant neoplasm: Secondary | ICD-10-CM | POA: Insufficient documentation

## 2022-08-29 NOTE — Progress Notes (Signed)
Contacted [facility] regarding follow up from October 2021 colonoscopy. She will be due for repeat colonoscopy in October 2024. Called and notified to call [facility] to arrange.

## 2022-08-29 NOTE — Progress Notes (Signed)
GYN [facility]  Telephone:([phone] [phone]  Patient Care Team: Alba Cory, MD as PCP - General (Family Medicine) Conard Novak, MD as Attending Physician (Obstetrics and Gynecology) Carmina Miller, MD as Referring Physician (Radiation Oncology) Alwyn Pea, MD as Consulting Physician (Cardiology) Nolon Rod, MD as Referring Physician (Internal Medicine)    Name of the patient: Smita Lesh  [id number]  [birth year]   Date of visit: 08/29/2022  Gynecologic Oncology Interval Visit   Referring Provider: Conard Novak,  MD  Chief Concern: Grade 1 endometrial cancer  Subjective:  SHAKEIA KRUS is a 72 y.o. female G2P2, initially seen in consultation for Dr. Jean Rosenthal for grade 1 endometrial cancer s/p primary radiation therapy, not a surgical candidate d/t cardiac disease, brachytherapy completed 04/02/17. She is s/p prior XL ovarian cystectomy for benign disease. She returns for follow up and continued surveillance.   No new complaints today. No bleeding or discharge.  She has significant medical issues including cardiac valve requiring replacement.  She was diagnosed with Enterococcus faecalis endocarditis and was admitted to [facility] from December 07, 2021 to December 28, 2021. She has not seen Dr. Jean Rosenthal since he left his prior practice.  12/05/2021 CT A/P [facility]  Otherwise no CT evidence of acute intra-abdominal pathology.   No definite CT correlate for left lower quadrant abdominal pain, as  there are no focal inflammatory changes within the abdomen.   The endometrial abnormalities described on MRI pelvis 11/16/2016 are not  well evaluated on CT, although there is no surrounding ascites,  lymphadenopathy, or masses to correlate this viscera finding with patient's  current symptoms.   12/08/2022 CT Chest dissection angiogram protocol Status post TAVR, the valve is incompletely evaluated secondary on  this non-gated examination. No evidence of perivalvular abscess.   The ascending thoracic aorta measures up to 3.9 cm, previously 3.8 cm. The distal thoracic aorta, arch measures 2.8 cm in diameter. The remainder of the thoracic aorta is normal in caliber and course.   No evidence of intramural hematoma, aortic aneurysm, or aortic dissection.   The soft tissues of the visualized neck base are unremarkable. No axillary lymphadenopathy.   No mediastinal lymphadenopathy. No pericardial effusion.    Patent central airways. Bilateral linear pulmonary opacities are favored to represent scarring or atelectasis. No suspicious consolidative pulmonary opacities. Bilateral sub-4 mm pulmonary nodules are unchanged from 2018 (series 6, images 171, 212, 286, and 271).   Limited evaluation of the upper abdominal viscera secondary to contrast timing. Redemonstrated irregularity of the spleen, consistent with age-indeterminate splenic infarcts.      Impression:   1.  Status post TAVR without evidence of perivalvular abscess. Known aortic valve endocarditis is not well appreciated on this non-gated examination. 2.  No acute aortic pathology.    She has no complaints today.  Gynecologic Oncology History MAESYN FRISINGER is a pleasant female G2P2 s/p prior XL ovarian cystectomy for benign disease who is seen in consultation from Dr. Jean Rosenthal for grade 1 endometrial cancer.  10/17/2016 Endometrial biopsy and biopsy of cervical polyp  Part A: ENDOMETRIUM, BIOPSY:  WELL DIFFERENTIATED ENDOMETRIAL ADENOCARCINOMA (FIGO I). COMMENT: THIS CASE WAS REVIEWED IN INTRADEPARTMENTAL CONSULTATION AND THE DIAGNOSIS REFLECTS OUR CONSENSUS OPINION.DR,JACKSON'S OFFICE WAS INFORMED OF DIAGNOSIS ON 10/19/16 AT 1.10 PM.  Part B: ENDOCERVIX, POLYP:  MINUTE FRAGMENTS OFOF ADENOCARCINOMA PROBABLY FROM ENDOMETRIUM.  MINUTE FRAGMENTS OF BENIGN ENDOCERVICAL GLANDS, MUCUS, AND BLOOD.  Pap unsatisfactory  10/29/2017 Pelvic  ultrasound Uterus 7.3  x 5 x 5 cm with small leiomyomas Bilateral ovaries normal. Endometrial stripe 7.1 mm   CT C/A/P 11/20/2016 IMPRESSION: 1. There is a mildly enlarged right hilar lymph node at 1.1 cm, neck given the that there are no other indicators of metastatic disease, this is likely incidental. No worrisome pulmonary nodules identified. 2. The endometrium is indistinct. Uterine and adnexal contours relatively normal. 3. Asymmetric glandular tissues in the right upper breast compared to the left. Asymmetry is often normal, but mammographic correlation is suggested. 4. Other imaging findings of potential clinical significance: Aortic Atherosclerosis (ICD10-I70.0). Calcified aortic valve with faint calcification of the mitral valve. Cholelithiasis. Small type 1 hiatal hernia. Descending colon diverticulosis.  She was not a candidate for surgical management due to cardiac disease. She underwent primary radiation.    She underwent WPRT (45 Gy over 5 weeks) with Dr. Rushie Chestnut at [facility] followed by vaginal cuff brachytherapy with Dr. Marinell Blight at [facility]. She completed high-dose rate Brachytherapy to the cervix (treatment 5 of planned 5) on 04/02/2017.  04/16/3017 Mammogram negative   05/09/3017 CT chest wo contrast IMPRESSION: 1. Stable small scattered mediastinal and hilar lymph nodes. No mass or adenopathy. No new or progressive findings. 2. Stable small pulmonary nodules. Recommend follow-up noncontrast chest CT in 1 year. 3. Stable mild emphysematous changes.  07/31/3017 CT Abdomen Pelvis W Contrast IMPRESSION: Negative. No evidence of recurrent or metastatic carcinoma within the abdomen or pelvis. Cholelithiasis.  No radiographic evidence of cholecystitis. Mild hepatic steatosis. Colonic diverticulosis, without radiographic evidence of diverticulitis.  03/2018 she had noticed some vaginal spotting and evaluation included CT scan, [place], and EMBX.   04/11/2018 CT C/A/P IMPRESSION: 1. Stable exam. No  new or progressive finding since prior chest CT and abdomen/pelvis CT. 2. Multiple bilateral pulmonary nodules measuring 5 mm less, stable in the 1 year interval since 05/09/2017 suggesting benign etiology. Continued attention on follow-up recommended 3. Cholelithiasis. 4. Colonic diverticulosis without diverticulitis.  04/21/2018 Pelvic US Uterus: Measurements: 5.9 x 4.3 x 5.3 cm = volume: 69.5 mL. 11 x 10 x 18 mm subserosal fibroid in the anterior lower uterine segment. Endometrium Thickness: 8 mm.  No focal abnormality visualized.  Bilateral ovaries: No adnexal mass is seen.  04/23/2018   A. ENDOMETRIUM; BIOPSY:  - EXTREMELY SCANT BENIGN SQUAMOUS AND ENDOCERVICAL EPITHELIUM.  - NO ENDOMETRIAL TISSUE IS PRESENT.  We recommended repeat ultrasound and if EMS stable or increase we would repeat EMBx since the biopsy was not adequate for evaluation of the endometrial cavity.     06/17/2018 Pelvic Ultrasound FINDINGS: Uterus: Measurements: 6.3 x 3.8 x 5.0 cm = volume: 62.2 mL. 1.2 x 0.9 x 1.3 cm exophytic fibroid present at the level of the lower uterine segment.   Endometrium Thickness: 7.3 mm. No focal abnormality visualized. No increased vascularity.   Bilateral ovaries: Not visualized.  No adnexal mass.   Trace free fluid within the pelvis.  IMPRESSION: 1. Endometrial complex measures 7.3 mm in thickness without focalabnormality. 2. 1.3 cm fibroid arising from the lower uterine segment, stable.   [place] PEL 12/18/2018 Uterus: Measurements: 8.2 x 3.3 x 4.8 cm = volume: 67.7 mL. 1.3 x 0.9 x 1.4 cm subserosal fibroid again noted at the right lower uterine segment. Endometrium:Thickness: 5.4 mm. No focal abnormality visualized. No significantly increased vascularity. Right ovary: Not visualized.  No adnexal mass. Left ovary: Not visualized.  No adnexal mass. Other findingsNo abnormal free fluid.  IMPRESSION: 1. Endometrial complex measures 5.4 mm in thickness without  focal abnormality. Continued  ultrasound surveillance of treated endometrial carcinoma recommended as clinically warranted. 2. 1.4 cm fibroid at the right lower uterine segment, stable.  10/27/2019 mammogram:  IMPRESSION: No mammographic evidence of malignancy.   12/10/2019 colonoscopy A. COLON POLYPS X2, CECUM; COLD SNARE:  - FRAGMENTS (X2) OF TUBULAR ADENOMAS.  - SINGLE FRAGMENT OF BENIGN COLONIC MUCOSA WITH SUPERFICIAL  SESSILE/HYPERPLASTIC CHANGES.  - FRAGMENTS (X2) OF BENIGN COLONIC MUCOSA WITH NO SIGNIFICANT  HISTOPATHOLOGIC CHANGE.  - NEGATIVE FOR HIGH-GRADE DYSPLASIA AND MALIGNANCY.   B. COLON POLYPS X4, ASCENDING (X1) AND TRANSVERSE (X3); COLD SNARE:  - FRAGMENTS (X5) OF TUBULAR ADENOMAS.  - SINGLE FRAGMENT OF SESSILE SERRATED POLYP.  - NEGATIVE FOR HIGH-GRADE DYSPLASIA AND MALIGNANCY.   C. COLON POLYP, SIGMOID; COLD BIOPSY:  - POLYPOID FRAGMENT OF BENIGN COLONIC MUCOSA WITH UNDERLYING MATURE  ADIPOSE TISSUE, COMPATIBLE WITH SUBMUCOSAL LIPOMA.  - NEGATIVE FOR DYSPLASIA AND MALIGNANCY.   D. COLON, SIGMOID, NODULAR MUCOSA; COLD BIOPSY:  - BENIGN COLONIC MUCOSA WITH SUPERFICIAL REACTIVE CHANGES.  - NEGATIVE FOR FEATURES OF MICROSCOPIC COLITIS.  - NEGATIVE FOR DYSPLASIA AND MALIGNANCY.   08/10/2019 Chest CT IMPRESSION: 1. Multiple tiny pulmonary nodules scattered throughout the lungs bilaterally measuring 4 mm or less in size, stable compared to prior examinations, considered definitively benign. No findings to suggest metastatic disease in the thorax. 2. Aortic atherosclerosis, in addition to 2 vessel coronary artery disease. Please note that although the presence of coronary artery calcium documents the presence of coronary artery disease, the severity of this disease and any potential stenosis cannot be assessed on this non-gated CT examination. Assessment for potential risk factor modification, dietary therapy or pharmacologic therapy may be warranted, if clinically  indicated. 3. Ectasia of ascending thoracic aorta (4.0 cm in diameter). Recommend annual imaging followup by CTA or MRA. This recommendation follows 2010 ACCF/AHA/AATS/ACR/ASA/SCA/SCAI/SIR/STS/SVM Guidelines for the Diagnosis and Management of Patients with Thoracic Aortic Disease. Circulation. 2010; 121: U981-X914. Aortic aneurysm NOS (ICD10-I71.9). 4. Status post TAVR.  We shared this information with Dr. Carlynn Purl and patient is now followed by vascular surgeon.   7/22 Chest CT IMPRESSION: 1. Multiple tiny pulmonary nodules scattered throughout the lungs bilaterally measuring 4 mm or less in size, stable compared to prior examinations, considered definitively benign. No findings to suggest metastatic disease in the thorax. 2. Aortic atherosclerosis, in addition to 2 vessel coronary artery disease. Please note that although the presence of coronary artery calcium documents the presence of coronary artery disease, the severity of this disease and any potential stenosis cannot be assessed on this non-gated CT examination. Assessment for potential risk factor modification, dietary therapy or pharmacologic therapy may be warranted, if clinically indicated. 3. Ectasia of ascending thoracic aorta (4.0 cm in diameter). Recommend annual imaging followup by CTA or MRA. This recommendation follows 2010 ACCF/AHA/AATS/ACR/ASA/SCA/SCAI/SIR/STS/SVM Guidelines for the Diagnosis and Management of Patients with Thoracic Aortic Disease. Circulation. 2010; 121: N829-F621. Aortic aneurysm NOS (ICD10-I71.9). 4. Status post TAVR.   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/23/2020   Obesity (BMI 30.0-34.9) 12/23/2020   Thoracic aorta atherosclerosis (HCC) 12/23/2020  Subclavian vein occlusion, 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   Paroxysmal atrial fibrillation (HCC) 10/02/2019   Diverticulosis of colon 08/08/2018   Cholelithiasis 08/08/2018   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   Chronic acquired lymphedema 01/08/2017   FIGO stage II endometrial cancer (HCC) 10/29/2016   Left ventricular hypertrophy 11/27/2014   Benign essential HTN 08/21/2014   Dyslipidemia 08/21/2014   Cardiac dysrhythmia 08/21/2014   Dysmetabolic syndrome 08/21/2014   Female genuine stress incontinence 08/21/2014   History of shingles 08/21/2014   Personal history of skin cancer 11/24/2009    Past Medical History: Past Medical History:  Diagnosis Date   Actinic keratosis    Aortic stenosis    Cataract    Depression    Dysrhythmia    Endometrial cancer determined by uterine biopsy (HCC) 10/29/2016   Rad tx's and internal brachytherapy.   Heart murmur    HX OF   Hyperlipidemia    Hypertension    CONTROLLED ON MEDS   Lymphedema    Melanoma (HCC)    Left upper arm. 20 years ago   Shortness of breath dyspnea     Past Surgical History: Past Surgical History:  Procedure Laterality Date   AORTIC VALVE REPLACEMENT Bilateral 02/27/2017   CARDIAC CATHETERIZATION     CARDIAC VALVE REPLACEMENT  02/27/2017   CARDIOVERSION  10/2019   cataract surgery Bilateral 05/24/2014   second eye 06/07/2014   COLONOSCOPY WITH PROPOFOL N/A 09/24/2019   Procedure: COLONOSCOPY WITH PROPOFOL;  Surgeon: Pasty Spillers, MD;  Location: [facility] ENDOSCOPY;  Service: Gastroenterology;  Laterality: N/A;   COLONOSCOPY WITH PROPOFOL N/A 12/10/2019   Procedure: COLONOSCOPY WITH PROPOFOL;  Surgeon: Pasty Spillers, MD;  Location: [facility] ENDOSCOPY;  Service: Endoscopy;   Laterality: N/A;   CORONARY ARTERY BYPASS GRAFT     EYE SURGERY     MELANOMA EXCISION     OVARIAN CYST REMOVAL     EXPLORATORY LAPAROTOMY   TONSILLECTOMY      Past Gynecologic History:  Last Menstrual Period:  2016; postmenopausal Last pap: 2018   OB History:  OB History  Gravida Para Term Preterm AB Living  7 2 2   5 2   SAB IAB Ectopic Multiple Live Births  5       2    # Outcome Date GA Lbr Len/2nd Weight Sex Type Anes PTL Lv  7 Term 03/30/92 [redacted]w[redacted]d   F Vag-Spont   LIV  6 Term 04/30/86 [redacted]w[redacted]d   F Vag-Spont   LIV  5 SAB           4 SAB           3 SAB           2 SAB           1 SAB             Obstetric Comments  7 pregancies; 2 NSVDs    Family History: Family History  Problem Relation Age of Onset   Stroke Mother    Thyroid nodules Mother    Lung cancer Mother    Cancer Mother    Hypertension Mother    Diabetes Sister    Hypertension Sister    Aortic aneurysm Father    Early death Father    Heart disease Father    Obesity Father    Stroke Father    Breast cancer Maternal Aunt    Bone cancer Maternal Uncle    Depression Daughter    Anxiety disorder Daughter  Depression Daughter     Social History: Social History   Socioeconomic History   Marital status: Divorced    Spouse name: Not on file   Number of children: 2   Years of education: Not on file   Highest education level: Bachelor's degree (e.g., BA, AB, BS)  Occupational History   Occupation: caregiver    Employer: [employer]    Comment: caregiver  Tobacco Use   Smoking status: Never   Smokeless tobacco: Never  Vaping Use   Vaping status: Never Used  Substance and Sexual Activity   Alcohol use: Not Currently   Drug use: No   Sexual activity: Not Currently    Birth control/protection: Abstinence, Post-menopausal  Other Topics Concern   Not on file  Social History Narrative   She is divorced   She has two daughters   The youngest is not currently talking to her since  07/2018. She got pregnant without being married and had the baby in October.    Social Determinants of Health   Financial Resource Strain: Medium Risk (05/24/2022)   Overall Financial Resource Strain (CARDIA)    Difficulty of Paying Living Expenses: Somewhat hard  Food Insecurity: No Food Insecurity (05/11/2022)   Hunger Vital Sign    Worried About Running Out of Food in the Last Year: Never true    Ran Out of Food in the Last Year: Never true  Transportation Needs: No Transportation Needs (05/24/2022)   PRAPARE - Administrator, Civil Service (Medical): No    Lack of Transportation (Non-Medical): No  Physical Activity: Insufficiently Active (05/11/2022)   Exercise Vital Sign    Days of Exercise per Week: 3 days    Minutes of Exercise per Session: 40 min  Stress: No Stress Concern Present (05/11/2022)   [employer] of Occupational Health - Occupational Stress Questionnaire    Feeling of Stress : Not at all  Social Connections: Moderately Integrated (05/11/2022)   Social Connection and Isolation Panel [NHANES]    Frequency of Communication with Friends and Family: Twice a week    Frequency of Social Gatherings with Friends and Family: Three times a week    Attends Religious Services: More than 4 times per year    Active Member of Clubs or Organizations: Yes    Attends Banker Meetings: More than 4 times per year    Marital Status: Divorced  Intimate Partner Violence: Not At Risk (09/12/2021)   Humiliation, Afraid, Rape, and Kick questionnaire    Fear of Current or Ex-Partner: No    Emotionally Abused: No    Physically Abused: No    Sexually Abused: No    Allergies: Allergies  Allergen Reactions   Protamine Other (See Comments)    Severe hypotension refractory to pressor and volume administration, only reversed by administration of epinephrine.   Ace Inhibitors Other (See Comments)    UNKNOWN REACTION   Duloxetine Other (See Comments)    Drowsiness     Current Medications: Current Outpatient Medications  Medication Sig Dispense Refill   amiodarone (PACERONE) 200 MG tablet Take 1 tablet (200 mg total) by mouth daily. 30 tablet 0   Ascorbic Acid (VITAMIN C) 1000 MG tablet Take 1,000 mg by mouth in the morning and at bedtime.     atorvastatin (LIPITOR) 40 MG tablet TAKE 1 TABLET (40 MG TOTAL) BY MOUTH 2 (TWO) TIMES A WEEK. 24 tablet 1   Coenzyme Q10 100 MG TABS Take 100 mg by  mouth daily.     furosemide (LASIX) 20 MG tablet Take 20 mg by mouth daily.     JARDIANCE 10 MG TABS tablet Take 10 mg by mouth daily.     Magnesium 500 MG CAPS Take 800 mg by mouth in the morning and at bedtime.      metoprolol succinate (TOPROL-XL) 50 MG 24 hr tablet Take 50 mg by mouth 2 (two) times daily.     Potassium 99 MG TABS Take 99 mg by mouth in the morning and at bedtime.     potassium gluconate 595 (99 K) MG TABS tablet Take 595 mg by mouth 2 (two) times daily.     Vitamin D-Vitamin K (VITAMIN K2-VITAMIN D3 PO) Take 2 capsules by mouth daily. 5000 units Vitamin D3 + 90 mcg of Vitamin K2     vitamin E 200 UNIT capsule Take 200 Units by mouth 2 (two) times daily.     zinc gluconate 50 MG tablet Take 50 mg by mouth daily.     No current facility-administered medications for this visit.   Review of Systems General:  no complaints Skin: no complaints Eyes: no complaints HEENT: no complaints Breasts: no complaints Pulmonary: no complaints Cardiac: no complaints Gastrointestinal: no complaints Genitourinary/Sexual: no complaints Ob/Gyn: no complaints Musculoskeletal: Hip muscle Hematology: no complaints Neurologic/Psych: no complaints    Objective:  Physical Examination:   BP (!) 140/64   Pulse 61   Temp (!) 96.6 F (35.9 C)   Resp 18   Wt 241 lb (109.3 kg)   SpO2 97%   BMI 35.08 kg/m   ECOG Performance Status: 0  GENERAL: Patient is a well appearing female in no acute distress HEENT:  Atraumatic and normocephalic. PERRL NODES:  No  cervical, supraclavicular, axillary, or inguinal lymphadenopathy palpated.  LUNGS: Normal respiratory effort ABDOMEN:  Soft, nontender. Nondistended. No masses/ascites/hernia/or hepatomegaly.  EXTREMITIES:  3+ peripheral edema and wearing lower leg compression stockings. Atraumatic. No cyanosis  NEURO:  Nonfocal. Well oriented.  Appropriate affect.  Pelvic: EGBUS: no lesions Cervix: no lesions, nontender, mobile.  Vagina: no lesions, no discharge or bleeding Uterus: not enlarged, nontender, mobile, exam limited by habitus.  Adnexa: no palpable masses.  Rectovaginal: deferred     Assessment:  NINEL ABDELLA is a 72 y.o. female diagnosed with possible stage II grade 1 endometrial cancer with involvement of a cervical polyp s/p primary radiation therapy. NED on exam.   Stable small pulmonary nodules on imaging - repeat CT chest 03/2018 stable. Follow CT chest 08/2020 considered definitively benign. CT chest 11/[facility]21 at [facility] no concerning findings noted with regard to pulmonary nodules  Enlarged hilar node - no evidence of progression on imaging.   Asymmetric breast tissue - negative mammogram, recommended she follow up with her screening mammogram which was negative.   S/P TAVR (transcatheter aortic valve replacement)  H/o hyperplastic polyp on colonoscopy.   LE lymphedema  Medical co-morbidities complicating care:  left ventricular hypertrophy, severe aortic stenosis, cardiac dysrhythmia, hyperlipidemia,, HTN and obesity.    Plan:   Encounter for follow-up surveillance of endometrial cancer  She has now been under surveillance for greater than 5 years after her initial treatment.  She is clinically without evidence of disease and will be released from clinic.  I have referred her to Dr. Thomasene Mohair  Recommend that she follow-up with her primary care physicians as well as other providers for her other medical issues as well as history of hyperplastic polyp. Durene Romans  will  reach out and remind her to follow up with her GI team.   Precautions were given and we recommend that she contact us if she has any concerning symptoms such as vaginal bleeding.  She can also follow-up with Dr. Jean Rosenthal already and gynecologic related issues  I personally interviewed and examined the patient. Agreed with the above/below plan of care. I have directly contributed to assessment and plan of care of this patient and educated and discussed with patient and family.  Anayeli Arel Leta Jungling, MD

## 2022-09-18 ENCOUNTER — Ambulatory Visit: Payer: Medicare HMO

## 2022-09-20 ENCOUNTER — Ambulatory Visit: Payer: Medicare HMO

## 2022-09-20 ENCOUNTER — Encounter: Payer: Self-pay | Admitting: Family Medicine

## 2022-09-22 ENCOUNTER — Other Ambulatory Visit: Payer: Self-pay | Admitting: Family Medicine

## 2022-09-22 DIAGNOSIS — E785 Hyperlipidemia, unspecified: Secondary | ICD-10-CM

## 2022-09-24 ENCOUNTER — Other Ambulatory Visit: Payer: Self-pay

## 2022-09-24 DIAGNOSIS — E785 Hyperlipidemia, unspecified: Secondary | ICD-10-CM

## 2022-09-27 ENCOUNTER — Ambulatory Visit: Payer: Medicare HMO

## 2022-09-28 NOTE — Progress Notes (Unsigned)
Name: Melody Soto   MRN: [id number]    DOB: 1950/09/26   Date:10/01/2022       Progress Note  Subjective  Chief Complaint  Referral  HPI  She developed left out hip pain after her Aorta Valve replacement in Feb, pain is mostly on left outer hip but also on left buttocks. Better with rest/no pain when sitting. Worse when walking , over the past two weeks she has been using a cane.   MDD: she is feeling down again, mostly due to physical health now, does not like inability to move around, cannot do things that she enjoys like working in her yard. She does not want to take medications at this time   Patient Active Problem List   Diagnosis Date Noted   Mild protein-calorie malnutrition (HCC) 05/15/2022   Thrombus of aorta (HCC) 05/15/2022   Morbid obesity (HCC) 05/15/2022   Hypercalcemia 07/28/2021   Chronic combined systolic and diastolic congestive heart failure (HCC) 07/28/2021   History of colon polyps 07/28/2021   Elevated hematocrit 07/28/2021   Vitamin D deficiency 07/28/2021   Hyperglycemia 07/28/2021   Skin lesion 07/28/2021   Mild major depression (HCC) 12/23/2020   Obesity (BMI 30.0-34.9) 12/23/2020   Thoracic aorta atherosclerosis (HCC) 12/23/2020   Subclavian vein occlusion, 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   Paroxysmal atrial fibrillation (HCC) 10/02/2019   Diverticulosis of colon 08/08/2018   Cholelithiasis 08/08/2018   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   Chronic acquired lymphedema 01/08/2017   FIGO stage II endometrial cancer (HCC) 10/29/2016   Left ventricular hypertrophy 11/27/2014   Benign essential HTN 08/21/2014   Dyslipidemia 08/21/2014   Cardiac dysrhythmia 08/21/2014   Dysmetabolic syndrome 08/21/2014   Female genuine stress incontinence 08/21/2014   History of shingles 08/21/2014   Personal history of skin cancer 11/24/2009    Past Surgical History:  Procedure Laterality Date   AORTIC VALVE REPLACEMENT Bilateral 02/27/2017    CARDIAC CATHETERIZATION     CARDIAC VALVE REPLACEMENT  02/27/2017   CARDIOVERSION  10/2019   cataract surgery Bilateral 05/24/2014   second eye 06/07/2014   COLONOSCOPY WITH PROPOFOL N/A 09/24/2019   Procedure: COLONOSCOPY WITH PROPOFOL;  Surgeon: Pasty Spillers, MD;  Location: [facility] END[facility]COPY;  Service: Gastroenterology;  Laterality: N/A;   COLONOSCOPY WITH PROPOFOL N/A 12/10/2019   Procedure: COLONOSCOPY WITH PROPOFOL;  Surgeon: Pasty Spillers, MD;  Location: [facility] END[facility]COPY;  Service: Endoscopy;  Laterality: N/A;   CORONARY ARTERY BYPASS GRAFT     EYE SURGERY     MELANOMA EXCISION     OVARIAN CYST REMOVAL     EXPLORATORY LAPAROTOMY   TONSILLECTOMY      Family History  Problem Relation Age of Onset   Stroke Mother    Thyroid nodules Mother    Lung cancer Mother    Cancer Mother    Hypertension Mother    Diabetes Sister    Hypertension Sister    Aortic aneurysm Father    Early death Father    Heart disease Father    Obesity Father    Stroke Father    Breast cancer Maternal Aunt    Bone cancer Maternal Uncle    Depression Daughter    Anxiety disorder Daughter    Depression Daughter     Social History   Tobacco Use   Smoking status: Never   Smokeless tobacco: Never  Substance Use Topics   Alcohol use: Not Currently     Current Outpatient Medications:    amiodarone (PACERONE)  200 MG tablet, Take 1 tablet (200 mg total) by mouth daily., Disp: 30 tablet, Rfl: 0   Ascorbic Acid (VITAMIN C) 1000 MG tablet, Take 1,000 mg by mouth in the morning and at bedtime., Disp: , Rfl:    atorvastatin (LIPITOR) 40 MG tablet, TAKE 1 TABLET (40 MG TOTAL) BY MOUTH 2 (TWO) TIMES A WEEK., Disp: 24 tablet, Rfl: 0   Coenzyme Q10 100 MG TABS, Take 100 mg by mouth daily., Disp: , Rfl:    furosemide (LASIX) 20 MG tablet, Take 20 mg by mouth daily., Disp: , Rfl:    JARDIANCE 10 MG TABS tablet, Take 10 mg by mouth daily., Disp: , Rfl:    Magnesium 500 MG CAPS, Take 800 mg by mouth  in the morning and at bedtime. , Disp: , Rfl:    metoprolol succinate (TOPROL-XL) 50 MG 24 hr tablet, Take 50 mg by mouth 2 (two) times daily., Disp: , Rfl:    Potassium 99 MG TABS, Take 99 mg by mouth in the morning and at bedtime., Disp: , Rfl:    Vitamin D-Vitamin K (VITAMIN K2-VITAMIN D3 PO), Take 2 capsules by mouth daily. 5000 units Vitamin D3 + 90 mcg of Vitamin K2, Disp: , Rfl:    vitamin E 200 UNIT capsule, Take 200 Units by mouth 2 (two) times daily., Disp: , Rfl:    zinc gluconate 50 MG tablet, Take 50 mg by mouth daily., Disp: , Rfl:   Allergies  Allergen Reactions   Protamine Other (See Comments)    Severe hypotension refractory to pressor and volume administration, only reversed by administration of epinephrine.   Ace Inhibitors Other (See Comments)    UNKNOWN REACTION   Duloxetine Other (See Comments)    Drowsiness    I personally reviewed active problem list, medication list, allergies, family history, social history, health maintenance with the patient/caregiver today.   ROS  Constitutional: Negative for fever or weight change.  Respiratory: Negative for cough and shortness of breath.   Cardiovascular: Negative for chest pain or palpitations.  Gastrointestinal: Negative for abdominal pain, no bowel changes.  Musculoskeletal: positive  for gait problem or joint swelling.  Skin: Negative for rash.  Neurological: Negative for dizziness or headache.  No other specific complaints in a complete review of systems (except as listed in HPI above).   Objective  Vitals:   10/01/22 0927  BP: 120/76  Pulse: 75  Resp: 16  SpO2: 98%  Weight: 247 lb (112 kg)  Height: 5\' 8"  (1.727 m)    Body mass index is 37.56 kg/m.  Physical Exam  Constitutional: Patient appears well-developed and well-nourished. Obese  No distress.  HEENT: head atraumatic, normocephalic, pupils equal and reactive to light, neck supple, throat within normal limits Cardiovascular: Normal rate,  regular rhythm and normal heart sounds.  No murmur heard. No BLE edema. Pulmonary/Chest: Effort normal and breath sounds normal. No respiratory distress. Muscular Skeletal: pain with left lateral bending, pain with extension of back, pain during palpation of left trochanteric bursa  Abdominal: Soft.  There is no tenderness. Psychiatric: Patient has a normal mood and affect. behavior is normal. Judgment and thought content normal.   PHQ2/9:    10/01/2022    9:26 AM 05/15/2022   10:48 AM 02/28/2022    3:32 PM 02/23/2022   11:14 AM 01/31/2022    1:44 PM  Depression screen PHQ 2/9  Decreased Interest 0 0 0 0 0  Down, Depressed, Hopeless 1 0 0 0 0  PHQ -  2 Score 1 0 0 0 0  Altered sleeping 0 0 0 0 0  Tired, decreased energy 0 0 0 0 0  Change in appetite 0 0 0 0 0  Feeling bad or failure about yourself  0 0 0 0 0  Trouble concentrating 0 0 0 0 0  Moving slowly or fidgety/restless 0 0 0 0 0  Suicidal thoughts 0 0 0 0 0  PHQ-9 Score 1 0 0 0 0  Difficult doing work/chores   Not difficult at all      phq 9 is negative   Fall Risk:    10/01/2022    9:26 AM 05/15/2022   10:48 AM 02/28/2022    9:09 AM 02/23/2022   11:14 AM 01/31/2022    1:44 PM  Fall Risk   Falls in the past year? 0 0 1 0 0  Comment   sick weak and dehydrated, sat on toilet, turned and slide off.    Number falls in past yr: 0 0 0  0  Injury with Fall? 0 0 0  0  Risk for fall due to : No Fall Risks No Fall Risks No Fall Risks;Medication side effect No Fall Risks No Fall Risks  Follow up Falls prevention discussed Falls prevention discussed Falls evaluation completed;Falls prevention discussed;Education provided Falls prevention discussed Falls prevention discussed      Functional Status Survey: Is the patient deaf or have difficulty hearing?: No Does the patient have difficulty seeing, even when wearing glasses/contacts?: No Does the patient have difficulty concentrating, remembering, or making decisions?: No Does the  patient have difficulty walking or climbing stairs?: Yes Does the patient have difficulty dressing or bathing?: No Does the patient have difficulty doing errands alone such as visiting a doctor's office or shopping?: Yes    Assessment & Plan  1. Greater trochanteric bursitis, left  - celecoxib (CELEBREX) 100 MG capsule; Take 1 capsule (100 mg total) by mouth 2 (two) times daily as needed.  Dispense: 180 capsule; Refill: 0  Consent form signed Localized bursa - Trochanteric bursa left  Area prepped with alcohol  Injection with lidocaine 1% and Kenalog 40mg /1 ml on bursa sack Patient tolerated procedure well No side effects   2. S/P AVR (aortic valve replacement)  On Eliquis and discussed risk of bleeding with celebrex, to take at most once a day and prn   3. Mild major depression (HCC)  She does not want medications at this tim

## 2022-10-01 ENCOUNTER — Encounter: Payer: Self-pay | Admitting: Family Medicine

## 2022-10-01 ENCOUNTER — Ambulatory Visit (INDEPENDENT_AMBULATORY_CARE_PROVIDER_SITE_OTHER): Payer: Medicare HMO | Admitting: Family Medicine

## 2022-10-01 VITALS — BP 120/76 | HR 75 | Resp 16 | Ht 68.0 in | Wt 247.0 lb

## 2022-10-01 DIAGNOSIS — M7062 Trochanteric bursitis, left hip: Secondary | ICD-10-CM

## 2022-10-01 DIAGNOSIS — F320 Major depressive disorder, single episode, mild: Secondary | ICD-10-CM | POA: Diagnosis not present

## 2022-10-01 DIAGNOSIS — Z952 Presence of prosthetic heart valve: Secondary | ICD-10-CM

## 2022-10-01 MED ORDER — CELECOXIB 100 MG PO CAPS
100.0000 mg | ORAL_CAPSULE | Freq: Two times a day (BID) | ORAL | 0 refills | Status: DC | PRN
Start: 2022-10-01 — End: 2023-01-03

## 2022-10-01 MED ORDER — TRIAMCINOLONE ACETONIDE 40 MG/ML IJ SUSP
40.0000 mg | Freq: Once | INTRAMUSCULAR | Status: AC
Start: 2022-10-01 — End: 2022-10-01
  Administered 2022-10-01: 40 mg via INTRAMUSCULAR

## 2022-10-01 MED ORDER — LIDOCAINE HCL (PF) 1 % IJ SOLN
2.0000 mL | Freq: Once | INTRAMUSCULAR | Status: AC
Start: 2022-10-01 — End: 2022-10-01
  Administered 2022-10-01: 2 mL via INTRADERMAL

## 2022-10-11 ENCOUNTER — Ambulatory Visit (INDEPENDENT_AMBULATORY_CARE_PROVIDER_SITE_OTHER): Payer: Medicare HMO

## 2022-10-11 VITALS — Ht 68.5 in | Wt 247.0 lb

## 2022-10-11 DIAGNOSIS — Z0000 Encounter for general adult medical examination without abnormal findings: Secondary | ICD-10-CM | POA: Diagnosis not present

## 2022-10-11 DIAGNOSIS — Z1211 Encounter for screening for malignant neoplasm of colon: Secondary | ICD-10-CM

## 2022-10-11 NOTE — Progress Notes (Signed)
Subjective:   Melody Soto is a 72 y.o. female who presents for Medicare Annual (Subsequent) preventive examination.  Visit Complete: Virtual  I connected with  Rachelle Hora on 10/11/22 by a audio enabled telemedicine application and verified that I am speaking with the correct person using two identifiers.  Patient Location: Home  Provider Location: Office/Clinic  I discussed the limitations of evaluation and management by telemedicine. The patient expressed understanding and agreed to proceed.  Vital Signs: Unable to obtain new vitals due to this being a telehealth visit.  Patient Medicare AWV questionnaire was completed by the patient on 10/01/22; I have confirmed that all information answered by patient is correct and no changes since this date.  Review of Systems    Cardiac Risk Factors include: advanced age (>34men, >56 women);dyslipidemia;obesity (BMI >30kg/m2);sedentary lifestyle;hypertension    Objective:    Today's Vitals   10/11/22 1132 10/11/22 1134  Weight: 247 lb (112 kg)   Height: 5' 8.5" (1.74 m)   PainSc:  7    Body mass index is 37.01 kg/m.     10/11/2022   11:51 AM 08/29/2022   10:04 AM 02/28/2022    9:07 AM 12/03/2021    5:18 PM 08/25/2020   10:23 AM 01/29/2020    1:06 PM 01/28/2020   10:36 AM  Advanced Directives  Does Patient Have a Medical Advance Directive? No No No No No No No  Would patient like information on creating a medical advance directive?  No - Patient declined No - Patient declined No - Patient declined No - Patient declined No - Patient declined No - Patient declined    Current Medications (verified) Outpatient Encounter Medications as of 10/11/2022  Medication Sig   amiodarone (PACERONE) 200 MG tablet Take 1 tablet (200 mg total) by mouth daily.   apixaban (ELIQUIS) 5 MG TABS tablet Take 5 mg by mouth 2 (two) times daily.   Ascorbic Acid (VITAMIN C) 1000 MG tablet Take 1,000 mg by mouth in the morning and at bedtime.    atorvastatin (LIPITOR) 40 MG tablet TAKE 1 TABLET (40 MG TOTAL) BY MOUTH 2 (TWO) TIMES A WEEK.   celecoxib (CELEBREX) 100 MG capsule Take 1 capsule (100 mg total) by mouth 2 (two) times daily as needed.   Coenzyme Q10 100 MG TABS Take 100 mg by mouth daily.   furosemide (LASIX) 20 MG tablet Take 20 mg by mouth daily.   JARDIANCE 10 MG TABS tablet Take 10 mg by mouth daily.   Magnesium 500 MG CAPS Take 800 mg by mouth in the morning and at bedtime.    metoprolol succinate (TOPROL-XL) 50 MG 24 hr tablet Take 50 mg by mouth 2 (two) times daily.   Potassium 99 MG TABS Take 99 mg by mouth in the morning and at bedtime.   Vitamin D-Vitamin K (VITAMIN K2-VITAMIN D3 PO) Take 2 capsules by mouth daily. 5000 units Vitamin D3 + 90 mcg of Vitamin K2   vitamin E 200 UNIT capsule Take 200 Units by mouth 2 (two) times daily.   zinc gluconate 50 MG tablet Take 50 mg by mouth daily.   No facility-administered encounter medications on file as of 10/11/2022.    Allergies (verified) Protamine, Ace inhibitors, and Duloxetine   History: Past Medical History:  Diagnosis Date   Actinic keratosis    Aortic stenosis    Blood transfusion without reported diagnosis Nov. 2023   While hospitalized @ [facility].   Cataract    CHF (  congestive heart failure) (HCC)    Depression    Dysrhythmia    Endometrial cancer determined by uterine biopsy (HCC) 10/29/2016   Rad tx's and internal brachytherapy.   Heart murmur    HX OF   Hyperlipidemia    Hypertension    CONTROLLED ON MEDS   Lymphedema    Melanoma (HCC)    Left upper arm. 20 years ago   Shortness of breath dyspnea    Past Surgical History:  Procedure Laterality Date   AORTIC VALVE REPLACEMENT Bilateral 02/27/2017   AORTIC VALVE REPLACEMENT  03/2022   CARDIAC CATHETERIZATION     CARDIAC VALVE REPLACEMENT  02/27/2017   12/15/21   CARDIOVERSION  10/2019   cataract surgery Bilateral 05/24/2014   second eye 06/07/2014   COLONOSCOPY WITH PROPOFOL N/A  09/24/2019   Procedure: COLONOSCOPY WITH PROPOFOL;  Surgeon: Pasty Spillers, MD;  Location: [facility] END[facility];  Service: Gastroenterology;  Laterality: N/A;   COLONOSCOPY WITH PROPOFOL N/A 12/10/2019   Procedure: COLONOSCOPY WITH PROPOFOL;  Surgeon: Pasty Spillers, MD;  Location: [facility];  Service: Endoscopy;  Laterality: N/A;   CORONARY ARTERY BYPASS GRAFT     EYE SURGERY     MELANOMA EXCISION     OVARIAN CYST REMOVAL     EXPLORATORY LAPAROTOMY   TONSILLECTOMY     Family History  Problem Relation Age of Onset   Stroke Mother    Thyroid nodules Mother    Lung cancer Mother    Cancer Mother    Hypertension Mother    Diabetes Sister    Hypertension Sister    Aortic aneurysm Father    Early death Father    Heart disease Father    Obesity Father    Stroke Father    Breast cancer Maternal Aunt    Bone cancer Maternal Uncle    Depression Daughter    Anxiety disorder Daughter    Depression Daughter    Social History   Socioeconomic History   Marital status: Divorced    Spouse name: Not on file   Number of children: 2   Years of education: Not on file   Highest education level: Bachelor's degree (e.g., BA, AB, BS)  Occupational History   Occupation: caregiver    Employer: [employer]    Comment: caregiver  Tobacco Use   Smoking status: Never   Smokeless tobacco: Never  Vaping Use   Vaping status: Never Used  Substance and Sexual Activity   Alcohol use: Not Currently   Drug use: No   Sexual activity: Not Currently    Birth control/protection: Abstinence, Post-menopausal  Other Topics Concern   Not on file  Social History Narrative   She is divorced   She has two daughters   The youngest is not currently talking to her since 07/2018. She got pregnant without being married and had the baby in October.    Social Determinants of Health   Financial Resource Strain: Low Risk  (10/11/2022)   Overall Financial Resource Strain (CARDIA)     Difficulty of Paying Living Expenses: Not hard at all  Food Insecurity: No Food Insecurity (10/11/2022)   Hunger Vital Sign    Worried About Running Out of Food in the Last Year: Never true    Ran Out of Food in the Last Year: Never true  Transportation Needs: No Transportation Needs (10/11/2022)   PRAPARE - Administrator, Civil Service (Medical): No    Lack of Transportation (Non-Medical): No  Physical Activity: Inactive (10/11/2022)   Exercise Vital Sign    Days of Exercise per Week: 0 days    Minutes of Exercise per Session: 0 min  Stress: No Stress Concern Present (10/11/2022)   [employer] of Occupational Health - Occupational Stress Questionnaire    Feeling of Stress : Not at all  Social Connections: Moderately Integrated (10/11/2022)   Social Connection and Isolation Panel [NHANES]    Frequency of Communication with Friends and Family: Twice a week    Frequency of Social Gatherings with Friends and Family: Three times a week    Attends Religious Services: 1 to 4 times per year    Active Member of Clubs or Organizations: Yes    Attends Engineer, structural: More than 4 times per year    Marital Status: Divorced    Tobacco Counseling Counseling given: Not Answered   Clinical Intake:  Pre-visit preparation completed: Yes  Pain : 0-10 Pain Score: 7  Pain Type: Chronic pain Pain Location: Hip Pain Orientation: Left Pain Descriptors / Indicators: Aching, Pressure Pain Onset: More than a month ago Pain Frequency: Intermittent Pain Relieving Factors: injections  Pain Relieving Factors: injections  BMI - recorded: 37.01 Nutritional Status: BMI > 30  Obese Nutritional Risks: None Diabetes: No  How often do you need to have someone help you when you read instructions, pamphlets, or other written materials from your doctor or pharmacy?: 1 - Never  Interpreter Needed?: No  Comments: lives alone Information entered by ::  B.Nasiya Pascual,LPN   Activities of Daily Living    10/11/2022   11:52 AM 10/01/2022    9:26 AM  In your present state of health, do you have any difficulty performing the following activities:  Hearing? 0 0  Vision? 0 0  Difficulty concentrating or making decisions? 0 0  Walking or climbing stairs? 1 1  Dressing or bathing? 0 0  Doing errands, shopping? 1 1  Preparing Food and eating ? N   Using the Toilet? N   In the past six months, have you accidently leaked urine? N   Do you have problems with loss of bowel control? N   Managing your Medications? N   Managing your Finances? N   Housekeeping or managing your Housekeeping? N     Patient Care Team: Alba Cory, MD as PCP - General (Family Medicine) Conard Novak, MD as Attending Physician (Obstetrics and Gynecology) Carmina Miller, MD as Referring Physician (Radiation Oncology) Alwyn Pea, MD as Consulting Physician (Cardiology) Nolon Rod, MD as Referring Physician (Internal Medicine)  Indicate any recent Medical Services you may have received from other than [facility] providers in the past year (date may be approximate).     Assessment:   This is a routine wellness examination for Annissia.  Hearing/Vision screen Hearing Screening - Comments:: Adequate hearing Vision Screening - Comments:: Adequate vision after cataract surgery:readers only Dr Senaida Ores   Dietary issues and exercise activities discussed:     Goals Addressed             This Visit's Progress    Increase physical activity   Not on track    Recommend increasing physical activity to at least 3 days per week        Depression Screen    10/11/2022   11:44 AM 10/01/2022    9:26 AM 05/15/2022   10:48 AM 02/28/2022    3:32 PM 02/23/2022   11:14 AM 01/31/2022    1:44 PM 12/01/2021  1:57 PM  PHQ 2/9 Scores  PHQ - 2 Score 0 1 0 0 0 0 1  PHQ- 9 Score  1 0 0 0 0 4    Fall Risk    10/11/2022   11:38 AM 10/01/2022    9:26 AM 05/15/2022    10:48 AM 02/28/2022    9:09 AM 02/23/2022   11:14 AM  Fall Risk   Falls in the past year? 0 0 0 1 0  Comment    sick weak and dehydrated, sat on toilet, turned and slide off.   Number falls in past yr: 0 0 0 0   Injury with Fall? 0 0 0 0   Risk for fall due to : No Fall Risks No Fall Risks No Fall Risks No Fall Risks;Medication side effect No Fall Risks  Follow up Education provided;Falls prevention discussed Falls prevention discussed Falls prevention discussed Falls evaluation completed;Falls prevention discussed;Education provided Falls prevention discussed    MEDICARE RISK AT HOME: Medicare Risk at Home Any stairs in or around the home?: Yes If so, are there any without handrails?: Yes Home free of loose throw rugs in walkways, pet beds, electrical cords, etc?: Yes Adequate lighting in your home to reduce risk of falls?: Yes Life alert?: No Use of a cane, walker or w/c?: Yes (cane) Grab bars in the bathroom?: Yes Shower chair or bench in shower?: Yes Elevated toilet seat or a handicapped toilet?: Yes  TIMED UP AND GO:  Was the test performed?  No    Cognitive Function:        10/11/2022   11:53 AM 08/05/2018    2:23 PM  6CIT Screen  What Year? 0 points 0 points  What month? 0 points 0 points  What time? 0 points 0 points  Count back from 20 0 points 0 points  Months in reverse 0 points 0 points  Repeat phrase 0 points 0 points  Total Score 0 points 0 points    Immunizations Immunization History  Administered Date(s) Administered   H1N1 02/25/2017   Influenza, High Dose Seasonal PF 01/29/2018   Influenza,inj,Quad PF,6+ Mos 11/24/2015, 02/23/2022   Influenza-Unspecified 11/13/2010   Pneumococcal Conjugate-13 08/08/2018   Pneumococcal Polysaccharide-23 03/21/2017   Tdap 07/14/2013   Zoster, Live 11/24/2015    TDAP status: Up to date  Flu Vaccine status: Up to date  Pneumococcal vaccine status: Up to date  Covid-19 vaccine status: Completed vaccines  DECLINES  Qualifies for Shingles Vaccine? Yes   Zostavax completed No   Shingrix Completed?: No.    Education has been provided regarding the importance of this vaccine. Patient has been advised to call insurance company to determine out of pocket expense if they have not yet received this vaccine. Advised may also receive vaccine at local pharmacy or Health Dept. Verbalized acceptance and understanding.  Screening Tests Health Maintenance  Topic Date Due   INFLUENZA VACCINE  09/13/2022   Colonoscopy  12/10/2022   Zoster Vaccines- Shingrix (1 of 2) 12/29/2022 (Originally 11/11/1969)   MAMMOGRAM  04/07/2023   DTaP/Tdap/Td (2 - Td or Tdap) 07/15/2023   Medicare Annual Wellness (AWV)  10/11/2023   Pneumonia Vaccine 40+ Years old  Completed   DEXA SCAN  Completed   Hepatitis C Screening  Completed   HPV VACCINES  Aged Out   COVID-19 Vaccine  Discontinued   Fecal DNA (Cologuard)  Discontinued    Health Maintenance  Health Maintenance Due  Topic Date Due   INFLUENZA VACCINE  09/13/2022  Colonoscopy  12/10/2022    Colorectal cancer screening: Type of screening: Colonoscopy. Completed yes. Repeat every 5 years  Mammogram status: Completed yes. Repeat every year  Bone Density status: Completed yes. Results reflect: Bone density results: NORMAL. Repeat every 5 years.  Lung Cancer Screening: (Low Dose CT Chest recommended if Age 30-80 years, 20 pack-year currently smoking OR have quit w/in 15years.) does not qualify.   Lung Cancer Screening Referral: no  Additional Screening:  Hepatitis C Screening: does not qualify; Completed yes  Vision Screening: Recommended annual ophthalmology exams for early detection of glaucoma and other disorders of the eye. Is the patient up to date with their annual eye exam?  Yes  Who is the provider or what is the name of the office in which the patient attends annual eye exams? Dr Senaida Ores If pt is not established with a provider, would they like  to be referred to a provider to establish care? No .   Dental Screening: Recommended annual dental exams for proper oral hygiene  Diabetic Foot Exam: n/a  Community Resource Referral / Chronic Care Management: CRR required this visit?  No   CCM required this visit?  No    Plan:     I have personally reviewed and noted the following in the patient's chart:   Medical and social history Use of alcohol, tobacco or illicit drugs  Current medications and supplements including opioid prescriptions. Patient is not currently taking opioid prescriptions. Functional ability and status Nutritional status Physical activity Advanced directives List of other physicians Hospitalizations, surgeries, and ER visits in previous 12 months Vitals Screenings to include cognitive, depression, and falls Referrals and appointments  In addition, I have reviewed and discussed with patient certain preventive protocols, quality metrics, and best practice recommendations. A written personalized care plan for preventive services as well as general preventive health recommendations were provided to patient.    Sue Lush, LPN   [id number]   After Visit Summary: (MyChart) Due to this being a telephonic visit, the after visit summary with patients personalized plan was offered to patient via MyChart   Nurse Notes: The patient states she is doing well and has no concerns or questions at this time.

## 2022-10-11 NOTE — Patient Instructions (Addendum)
Ms. Cragun , Thank you for taking time to come for your Medicare Wellness Visit. I appreciate your ongoing commitment to your health goals. Please review the following plan we discussed and let me know if I can assist you in the future.   Referrals/Orders/Follow-Ups/Clinician Recommendations: none  This is a list of the screening recommended for you and due dates:  Health Maintenance  Topic Date Due   Flu Shot  09/13/2022   Colon Cancer Screening  12/10/2022   Zoster (Shingles) Vaccine (1 of 2) 12/29/2022*   Mammogram  04/07/2023   DTaP/Tdap/Td vaccine (2 - Td or Tdap) 07/15/2023   Medicare Annual Wellness Visit  10/11/2023   Pneumonia Vaccine  Completed   DEXA scan (bone density measurement)  Completed   Hepatitis C Screening  Completed   HPV Vaccine  Aged Out   COVID-19 Vaccine  Discontinued   Cologuard (Stool DNA test)  Discontinued  *Topic was postponed. The date shown is not the original due date.    Advanced directives: (Declined) Advance directive discussed with you today. Even though you declined this today, please call our office should you change your mind, and we can give you the proper paperwork for you to fill out.  Next Medicare Annual Wellness Visit scheduled for next year: Yes 10/18/2023 @ 1130 am telephone

## 2022-10-12 ENCOUNTER — Other Ambulatory Visit: Payer: Self-pay

## 2022-10-12 ENCOUNTER — Telehealth: Payer: Self-pay

## 2022-10-12 DIAGNOSIS — Z86010 Personal history of colonic polyps: Secondary | ICD-10-CM

## 2022-10-12 MED ORDER — NA SULFATE-K SULFATE-MG SULF 17.5-3.13-1.6 GM/177ML PO SOLN
1.0000 | Freq: Once | ORAL | 0 refills | Status: AC
Start: 2022-10-12 — End: 2022-10-12

## 2022-10-12 NOTE — Telephone Encounter (Signed)
Gastroenterology Pre-Procedure Review  Request Date: 12/31/22 Requesting Physician: Dr. Allegra Lai  PATIENT REVIEW QUESTIONS: The patient responded to the following health history questions as indicated:    1. Are you having any GI issues? no 2. Do you have a personal history of Polyps? yes (Last colonoscopy 12/10/19 with dr. Maximino Greenland recommended repeat in 3 years) 3. Do you have a family history of Colon Cancer or Polyps? no 4. Diabetes Mellitus? yes (advised to stop Jardiance 3 days prior to colonoscopy) 5. Joint replacements in the past 12 months?no 6. Major health problems in the past 3 months?no 7. Any artificial heart valves, MVP, or defibrillator? Heart surgery last November. Cardiology clearance sent to Dr. Juliann Pares    MEDICATIONS & ALLERGIES:    Patient reports the following regarding taking any anticoagulation/antiplatelet therapy:   Plavix, Coumadin, Eliquis, Xarelto, Lovenox, Pradaxa, Brilinta, or Effient? yes (blood thinner advice sent to Dr. Juliann Pares) Aspirin? no  Patient confirms/reports the following medications:  Current Outpatient Medications  Medication Sig Dispense Refill   amiodarone (PACERONE) 200 MG tablet Take 1 tablet (200 mg total) by mouth daily. 30 tablet 0   apixaban (ELIQUIS) 5 MG TABS tablet Take 5 mg by mouth 2 (two) times daily.     Ascorbic Acid (VITAMIN C) 1000 MG tablet Take 1,000 mg by mouth in the morning and at bedtime.     atorvastatin (LIPITOR) 40 MG tablet TAKE 1 TABLET (40 MG TOTAL) BY MOUTH 2 (TWO) TIMES A WEEK. 24 tablet 0   celecoxib (CELEBREX) 100 MG capsule Take 1 capsule (100 mg total) by mouth 2 (two) times daily as needed. 180 capsule 0   Coenzyme Q10 100 MG TABS Take 100 mg by mouth daily.     furosemide (LASIX) 20 MG tablet Take 20 mg by mouth daily.     JARDIANCE 10 MG TABS tablet Take 10 mg by mouth daily.     Magnesium 500 MG CAPS Take 800 mg by mouth in the morning and at bedtime.      metoprolol succinate (TOPROL-XL) 50 MG 24 hr  tablet Take 50 mg by mouth 2 (two) times daily.     Potassium 99 MG TABS Take 99 mg by mouth in the morning and at bedtime.     Vitamin D-Vitamin K (VITAMIN K2-VITAMIN D3 PO) Take 2 capsules by mouth daily. 5000 units Vitamin D3 + 90 mcg of Vitamin K2     vitamin E 200 UNIT capsule Take 200 Units by mouth 2 (two) times daily.     zinc gluconate 50 MG tablet Take 50 mg by mouth daily.     No current facility-administered medications for this visit.    Patient confirms/reports the following allergies:  Allergies  Allergen Reactions   Protamine Other (See Comments)    Severe hypotension refractory to pressor and volume administration, only reversed by administration of epinephrine.   Ace Inhibitors Other (See Comments)    UNKNOWN REACTION   Duloxetine Other (See Comments)    Drowsiness    No orders of the defined types were placed in this encounter.   AUTHORIZATION INFORMATION Primary Insurance: 1D#: Group #:  Secondary Insurance: 1D#: Group #:  SCHEDULE INFORMATION: Date: 12/31/22 Time: Location: [facility]

## 2022-10-29 ENCOUNTER — Telehealth: Payer: Self-pay | Admitting: Gastroenterology

## 2022-10-29 ENCOUNTER — Telehealth: Payer: Self-pay

## 2022-10-29 DIAGNOSIS — Z952 Presence of prosthetic heart valve: Secondary | ICD-10-CM | POA: Diagnosis not present

## 2022-10-29 DIAGNOSIS — I708 Atherosclerosis of other arteries: Secondary | ICD-10-CM | POA: Diagnosis not present

## 2022-10-29 DIAGNOSIS — Z8679 Personal history of other diseases of the circulatory system: Secondary | ICD-10-CM | POA: Diagnosis not present

## 2022-10-29 DIAGNOSIS — I890 Lymphedema, not elsewhere classified: Secondary | ICD-10-CM | POA: Diagnosis not present

## 2022-10-29 DIAGNOSIS — I10 Essential (primary) hypertension: Secondary | ICD-10-CM | POA: Diagnosis not present

## 2022-10-29 DIAGNOSIS — I480 Paroxysmal atrial fibrillation: Secondary | ICD-10-CM | POA: Diagnosis not present

## 2022-10-29 DIAGNOSIS — E669 Obesity, unspecified: Secondary | ICD-10-CM | POA: Diagnosis not present

## 2022-10-29 DIAGNOSIS — R5382 Chronic fatigue, unspecified: Secondary | ICD-10-CM | POA: Diagnosis not present

## 2022-10-29 DIAGNOSIS — E782 Mixed hyperlipidemia: Secondary | ICD-10-CM | POA: Diagnosis not present

## 2022-10-29 NOTE — Telephone Encounter (Signed)
Patient need to schedule her colonoscopy for a Wednesday or Thursday.

## 2022-10-29 NOTE — Telephone Encounter (Signed)
Returned phone call to reschedule patients colonoscopy.  Colonoscopy has been rescheduled to 01/03/23 still with Dr.Vanga at [facility].  Instructions updated to reflect date change and referral updated.  Thanks,  [place], [place]

## 2022-11-02 DIAGNOSIS — Z1231 Encounter for screening mammogram for malignant neoplasm of breast: Secondary | ICD-10-CM | POA: Diagnosis not present

## 2022-11-02 DIAGNOSIS — Z124 Encounter for screening for malignant neoplasm of cervix: Secondary | ICD-10-CM | POA: Diagnosis not present

## 2022-11-21 ENCOUNTER — Ambulatory Visit: Payer: Medicare HMO | Admitting: Family Medicine

## 2022-12-14 ENCOUNTER — Other Ambulatory Visit: Payer: Self-pay | Admitting: Family Medicine

## 2022-12-14 DIAGNOSIS — E785 Hyperlipidemia, unspecified: Secondary | ICD-10-CM

## 2022-12-14 NOTE — Telephone Encounter (Signed)
Called and lvm inquiring

## 2022-12-18 NOTE — Telephone Encounter (Signed)
Pt is scheduled for 01/07/23

## 2022-12-27 ENCOUNTER — Other Ambulatory Visit: Payer: Self-pay | Admitting: Family Medicine

## 2022-12-27 DIAGNOSIS — M7062 Trochanteric bursitis, left hip: Secondary | ICD-10-CM

## 2023-01-02 ENCOUNTER — Encounter: Payer: Self-pay | Admitting: Gastroenterology

## 2023-01-03 ENCOUNTER — Ambulatory Visit: Payer: Medicare HMO | Admitting: Certified Registered Nurse Anesthetist

## 2023-01-03 ENCOUNTER — Ambulatory Visit
Admission: RE | Admit: 2023-01-03 | Discharge: 2023-01-03 | Disposition: A | Discharge status: Home / Self Care | Payer: Medicare HMO | Attending: Gastroenterology | Admitting: Gastroenterology

## 2023-01-03 ENCOUNTER — Encounter: Payer: Self-pay | Admitting: Gastroenterology

## 2023-01-03 ENCOUNTER — Other Ambulatory Visit: Payer: Self-pay

## 2023-01-03 ENCOUNTER — Encounter
Admission: RE | Disposition: A | Discharge status: Home / Self Care | Payer: Self-pay | Source: Home / Self Care | Attending: Gastroenterology

## 2023-01-03 DIAGNOSIS — I110 Hypertensive heart disease with heart failure: Secondary | ICD-10-CM | POA: Diagnosis not present

## 2023-01-03 DIAGNOSIS — K5520 Angiodysplasia of colon without hemorrhage: Secondary | ICD-10-CM | POA: Insufficient documentation

## 2023-01-03 DIAGNOSIS — I4891 Unspecified atrial fibrillation: Secondary | ICD-10-CM | POA: Diagnosis not present

## 2023-01-03 DIAGNOSIS — K635 Polyp of colon: Secondary | ICD-10-CM | POA: Diagnosis not present

## 2023-01-03 DIAGNOSIS — Z952 Presence of prosthetic heart valve: Secondary | ICD-10-CM | POA: Diagnosis not present

## 2023-01-03 DIAGNOSIS — I509 Heart failure, unspecified: Secondary | ICD-10-CM | POA: Diagnosis not present

## 2023-01-03 DIAGNOSIS — K5730 Diverticulosis of large intestine without perforation or abscess without bleeding: Secondary | ICD-10-CM | POA: Diagnosis not present

## 2023-01-03 DIAGNOSIS — D122 Benign neoplasm of ascending colon: Secondary | ICD-10-CM | POA: Insufficient documentation

## 2023-01-03 DIAGNOSIS — Z1211 Encounter for screening for malignant neoplasm of colon: Secondary | ICD-10-CM | POA: Insufficient documentation

## 2023-01-03 DIAGNOSIS — Z860101 Personal history of adenomatous and serrated colon polyps: Secondary | ICD-10-CM | POA: Diagnosis not present

## 2023-01-03 DIAGNOSIS — I480 Paroxysmal atrial fibrillation: Secondary | ICD-10-CM | POA: Diagnosis not present

## 2023-01-03 DIAGNOSIS — Z8249 Family history of ischemic heart disease and other diseases of the circulatory system: Secondary | ICD-10-CM | POA: Diagnosis not present

## 2023-01-03 DIAGNOSIS — Z860100 Personal history of colon polyps, unspecified: Secondary | ICD-10-CM

## 2023-01-03 HISTORY — PX: COLONOSCOPY WITH PROPOFOL: SHX5780

## 2023-01-03 HISTORY — PX: POLYPECTOMY: SHX149

## 2023-01-03 HISTORY — DX: Personal history of other medical treatment: Z92.89

## 2023-01-03 HISTORY — DX: Orthostatic hypotension: I95.1

## 2023-01-03 HISTORY — DX: Endocarditis, valve unspecified: I38

## 2023-01-03 SURGERY — COLONOSCOPY WITH PROPOFOL
Anesthesia: General

## 2023-01-03 MED ORDER — LIDOCAINE HCL (CARDIAC) PF 100 MG/5ML IV SOSY
PREFILLED_SYRINGE | INTRAVENOUS | Status: DC | PRN
  Administered 2023-01-03: 50 mg via INTRAVENOUS

## 2023-01-03 MED ORDER — LIDOCAINE HCL (PF) 2 % IJ SOLN
INTRAMUSCULAR | Status: AC
  Filled 2023-01-03: qty 5

## 2023-01-03 MED ORDER — PROPOFOL 10 MG/ML IV BOLUS
INTRAVENOUS | Status: DC | PRN
  Administered 2023-01-03: 80 mg via INTRAVENOUS
  Administered 2023-01-03: 150 ug/kg/min via INTRAVENOUS

## 2023-01-03 MED ORDER — PROPOFOL 1000 MG/100ML IV EMUL
INTRAVENOUS | Status: AC
Start: 2023-01-03 — End: ?
  Filled 2023-01-03: qty 100

## 2023-01-03 MED ORDER — PHENYLEPHRINE 80 MCG/ML (10ML) SYRINGE FOR IV PUSH (FOR BLOOD PRESSURE SUPPORT)
PREFILLED_SYRINGE | INTRAVENOUS | Status: DC | PRN
  Administered 2023-01-03: 160 ug via INTRAVENOUS

## 2023-01-03 MED ORDER — SODIUM CHLORIDE 0.9 % IV SOLN: INTRAVENOUS | Status: DC

## 2023-01-03 NOTE — Anesthesia Procedure Notes (Signed)
Date/Time: 01/03/2023 7:47 AM  Performed by: Ginger Carne, CRNAPre-anesthesia Checklist: Patient identified, Emergency Drugs available, Suction available, Patient being monitored and Timeout performed Patient Re-evaluated:Patient Re-evaluated prior to induction Preoxygenation: Pre-oxygenation with 100% oxygen Induction Type: IV induction

## 2023-01-03 NOTE — Anesthesia Postprocedure Evaluation (Signed)
Anesthesia Post Note  Patient: Melody Soto  Procedure(s) Performed: COLONOSCOPY WITH PROPOFOL POLYPECTOMY INTESTINAL  Patient location during evaluation: Endoscopy Anesthesia Type: General Level of consciousness: awake and alert Pain management: pain level controlled Vital Signs Assessment: post-procedure vital signs reviewed and stable Respiratory status: spontaneous breathing, nonlabored ventilation, respiratory function stable and patient connected to nasal cannula oxygen Cardiovascular status: blood pressure returned to baseline and stable Postop Assessment: no apparent nausea or vomiting Anesthetic complications: no   No notable events documented.   Last Vitals:  Vitals:   01/03/23 0816 01/03/23 0826  BP: 132/68 138/67  Pulse: (!) 58 (!) 55  Resp: 14 17  Temp: (!) 35.7 C   SpO2: 100% 95%    Last Pain:  Vitals:   01/03/23 0826  TempSrc:   PainSc: 0-No pain                 Louie Boston

## 2023-01-03 NOTE — Transfer of Care (Signed)
Immediate Anesthesia Transfer of Care Note  Patient: Melody Soto  Procedure(s) Performed: COLONOSCOPY WITH PROPOFOL POLYPECTOMY INTESTINAL  Patient Location: Endoscopy Unit  Anesthesia Type:MAC and General  Level of Consciousness: awake, alert , and oriented  Airway & Oxygen Therapy: Patient Spontanous Breathing and Patient connected to nasal cannula oxygen  Post-op Assessment: Report given to RN and Post -op Vital signs reviewed and stable  Post vital signs: Reviewed and stable  Last Vitals:  Vitals Value Taken Time  BP 132/68 01/03/23 0816  Temp 35.7 C 01/03/23 0816  Pulse 58 01/03/23 0816  Resp 14 01/03/23 0816  SpO2 100 % 01/03/23 0816    Last Pain:  Vitals:   01/03/23 0816  TempSrc: Temporal  PainSc: 0-No pain         Complications: No notable events documented.

## 2023-01-03 NOTE — H&P (Signed)
Arlyss Repress, MD [address]  [address]  [place], [place] [address]  Main: [phone]  Fax: [phone] Pager: [phone]  Primary Care Physician:  Alba Cory, MD Primary Gastroenterologist:  Dr. Arlyss Repress  Pre-Procedure History & Physical: HPI:  Melody Soto is a 72 y.o. female is here for an colonoscopy.   Past Medical History:  Diagnosis Date   Actinic keratosis    Aortic stenosis    Blood transfusion without reported diagnosis Nov. 2023   While hospitalized @ [facility].   Cataract    CHF (congestive heart failure) (HCC)    Depression    Dysrhythmia    Endocarditis of prosthetic valve    Endometrial cancer determined by uterine biopsy (HCC) 10/29/2016   Rad tx's and internal brachytherapy.   Heart murmur    HX OF   History of cardioversion    Hyperlipidemia    Hypertension    CONTROLLED ON MEDS   Lymphedema    Melanoma (HCC)    Left upper arm. 20 years ago   Orthostatic hypotension    Shortness of breath dyspnea     Past Surgical History:  Procedure Laterality Date   AORTIC VALVE REPLACEMENT Bilateral 02/27/2017   AORTIC VALVE REPLACEMENT  03/2022   CARDIAC CATHETERIZATION     CARDIAC VALVE REPLACEMENT  02/27/2017   12/15/21   CARDIOVERSION  10/2019   cataract surgery Bilateral 05/24/2014   second eye 06/07/2014   COLONOSCOPY WITH PROPOFOL N/A 09/24/2019   Procedure: COLONOSCOPY WITH PROPOFOL;  Surgeon: Pasty Spillers, MD;  Location: [facility] ENDOSCOPY;  Service: Gastroenterology;  Laterality: N/A;   COLONOSCOPY WITH PROPOFOL N/A 12/10/2019   Procedure: COLONOSCOPY WITH PROPOFOL;  Surgeon: Pasty Spillers, MD;  Location: [facility] ENDOSCOPY;  Service: Endoscopy;  Laterality: N/A;   CORONARY ARTERY BYPASS GRAFT     EYE SURGERY     MELANOMA EXCISION     OVARIAN CYST REMOVAL     EXPLORATORY LAPAROTOMY   TONSILLECTOMY      Prior to Admission medications   Medication Sig Start Date End Date Taking? Authorizing Provider  amiodarone  (PACERONE) 200 MG tablet Take 1 tablet (200 mg total) by mouth daily. 02/23/22  Yes Sowles, Danna Hefty, MD  metoprolol succinate (TOPROL-XL) 50 MG 24 hr tablet Take 50 mg by mouth 2 (two) times daily. 05/14/22 05/09/23 Yes [provider]  apixaban (ELIQUIS) 5 MG TABS tablet Take 5 mg by mouth 2 (two) times daily.    [provider]  Ascorbic Acid (VITAMIN C) 1000 MG tablet Take 1,000 mg by mouth in the morning and at bedtime.    [provider]  atorvastatin (LIPITOR) 40 MG tablet TAKE 1 TABLET (40 MG TOTAL) BY MOUTH 2 (TWO) TIMES A WEEK. 12/20/22   Carlynn Purl, Danna Hefty, MD  celecoxib (CELEBREX) 100 MG capsule Take 1 capsule (100 mg total) by mouth 2 (two) times daily as needed. Patient not taking: Reported on 01/03/2023 10/01/22   Alba Cory, MD  Coenzyme Q10 100 MG TABS Take 100 mg by mouth daily.    [provider]  furosemide (LASIX) 20 MG tablet Take 20 mg by mouth daily. 01/25/22   [provider]  JARDIANCE 10 MG TABS tablet Take 10 mg by mouth daily. 12/10/19   [provider]  Magnesium 500 MG CAPS Take 800 mg by mouth in the morning and at bedtime.     [provider]  Potassium 99 MG TABS Take 99 mg by mouth in the morning  and at bedtime.    [provider]  Vitamin D-Vitamin K (VITAMIN K2-VITAMIN D3 PO) Take 2 capsules by mouth daily. 5000 units Vitamin D3 + 90 mcg of Vitamin K2    [provider]  vitamin E 200 UNIT capsule Take 200 Units by mouth 2 (two) times daily.    [provider]  zinc gluconate 50 MG tablet Take 50 mg by mouth daily.    [provider]    Allergies as of 10/12/2022 - Review Complete 10/12/2022  Allergen Reaction Noted   Protamine Other (See Comments) 12/15/2021   Ace inhibitors Other (See Comments) 08/21/2014   Duloxetine Other (See Comments) 08/10/2016    Family History  Problem Relation Age of Onset   Stroke Mother    Thyroid nodules Mother    Lung cancer  Mother    Cancer Mother    Hypertension Mother    Diabetes Sister    Hypertension Sister    Aortic aneurysm Father    Early death Father    Heart disease Father    Obesity Father    Stroke Father    Breast cancer Maternal Aunt    Bone cancer Maternal Uncle    Depression Daughter    Anxiety disorder Daughter    Depression Daughter     Social History   Socioeconomic History   Marital status: Divorced    Spouse name: Not on file   Number of children: 2   Years of education: Not on file   Highest education level: Bachelor's degree (e.g., BA, AB, BS)  Occupational History   Occupation: caregiver    Employer: [employer]    Comment: caregiver  Tobacco Use   Smoking status: Never   Smokeless tobacco: Never  Vaping Use   Vaping status: Never Used  Substance and Sexual Activity   Alcohol use: Not Currently   Drug use: No   Sexual activity: Not Currently    Birth control/protection: Abstinence, Post-menopausal  Other Topics Concern   Not on file  Social History Narrative   She is divorced   She has two daughters   The youngest is not currently talking to her since 07/2018. She got pregnant without being married and had the baby in October.    Social Determinants of Health   Financial Resource Strain: Low Risk  (10/11/2022)   Overall Financial Resource Strain (CARDIA)    Difficulty of Paying Living Expenses: Not hard at all  Food Insecurity: No Food Insecurity (10/11/2022)   Hunger Vital Sign    Worried About Running Out of Food in the Last Year: Never true    Ran Out of Food in the Last Year: Never true  Transportation Needs: No Transportation Needs (10/11/2022)   PRAPARE - Administrator, Civil Service (Medical): No    Lack of Transportation (Non-Medical): No  Physical Activity: Inactive (10/11/2022)   Exercise Vital Sign    Days of Exercise per Week: 0 days    Minutes of Exercise per Session: 0 min  Stress: No Stress Concern Present  (10/11/2022)   [employer] of Occupational Health - Occupational Stress Questionnaire    Feeling of Stress : Not at all  Social Connections: Moderately Integrated (10/11/2022)   Social Connection and Isolation Panel [NHANES]    Frequency of Communication with Friends and Family: Twice a week    Frequency of Social Gatherings with Friends and Family: Three times a week    Attends Religious Services: 1 to  4 times per year    Active Member of Clubs or Organizations: Yes    Attends Banker Meetings: More than 4 times per year    Marital Status: Divorced  Intimate Partner Violence: Not At Risk (10/11/2022)   Humiliation, Afraid, Rape, and Kick questionnaire    Fear of Current or Ex-Partner: No    Emotionally Abused: No    Physically Abused: No    Sexually Abused: No    Review of Systems: See HPI, otherwise negative ROS  Physical Exam: BP (!) 160/74   Pulse 63   Temp 97.6 F (36.4 C) (Temporal)   Resp 16   Ht 5' 8.5" (1.74 m)   Wt 112.5 kg   SpO2 99%   BMI 37.16 kg/m  General:   Alert,  pleasant and cooperative in NAD Head:  Normocephalic and atraumatic. Neck:  Supple; no masses or thyromegaly. Lungs:  Clear throughout to auscultation.    Heart:  Regular rate and rhythm. Abdomen:  Soft, nontender and nondistended. Normal bowel sounds, without guarding, and without rebound.   Neurologic:  Alert and  oriented x4;  grossly normal neurologically.  Impression/Plan: Melody Soto is here for an colonoscopy to be performed for h/o adenomas colon  Risks, benefits, limitations, and alternatives regarding  colonoscopy have been reviewed with the patient.  Questions have been answered.  All parties agreeable.   Lannette Donath, MD  01/03/2023, 7:39 AM

## 2023-01-03 NOTE — Op Note (Signed)
[facility] Gastroenterology Patient Name: Melody Soto Procedure Date: 01/03/2023 7:13 AM MRN: [id number] Account #: [id number] Date of Birth: Jan 05, 1951 Admit Type: Outpatient Age: 72 Room: [facility] ENDO ROOM 3 Gender: Female Note Status: Finalized Instrument Name: Prentice Docker [id number] Procedure:             Colonoscopy Indications:           Surveillance: Personal history of adenomatous polyps                         on last colonoscopy 3 years ago, Last colonoscopy:                         October 2021 Providers:             Toney Reil MD, MD Referring MD:          Onnie Boer. Sowles, MD (Referring MD) Medicines:             General Anesthesia Complications:         No immediate complications. Estimated blood loss: None. Procedure:             Pre-Anesthesia Assessment:                        - Prior to the procedure, a History and Physical was                         performed, and patient medications and allergies were                         reviewed. The patient is competent. The risks and                         benefits of the procedure and the sedation options and                         risks were discussed with the patient. All questions                         were answered and informed consent was obtained.                         Patient identification and proposed procedure were                         verified by the physician, the nurse, the                         anesthesiologist, the anesthetist and the technician                         in the pre-procedure area in the procedure room in the                         endoscopy suite. Mental Status Examination: alert and                         oriented. Airway Examination: normal oropharyngeal  airway and neck mobility. Respiratory Examination:                         clear to auscultation. CV Examination: normal.                         Prophylactic Antibiotics:  The patient does not require                         prophylactic antibiotics. Prior Anticoagulants: The                         patient has taken no anticoagulant or antiplatelet                         agents. ASA Grade Assessment: III - A patient with                         severe systemic disease. After reviewing the risks and                         benefits, the patient was deemed in satisfactory                         condition to undergo the procedure. The anesthesia                         plan was to use general anesthesia. Immediately prior                         to administration of medications, the patient was                         re-assessed for adequacy to receive sedatives. The                         heart rate, respiratory rate, oxygen saturations,                         blood pressure, adequacy of pulmonary ventilation, and                         response to care were monitored throughout the                         procedure. The physical status of the patient was                         re-assessed after the procedure.                        After obtaining informed consent, the colonoscope was                         passed under direct vision. Throughout the procedure,                         the patient's blood pressure, pulse, and oxygen  saturations were monitored continuously. The                         Colonoscope was introduced through the anus and                         advanced to the the cecum, identified by appendiceal                         orifice and ileocecal valve. The colonoscopy was                         performed with moderate difficulty due to significant                         looping and the patient's body habitus. Successful                         completion of the procedure was aided by applying                         abdominal pressure. The patient tolerated the                         procedure well. The  quality of the bowel preparation                         was evaluated using the BBPS [facility] Bowel Preparation                         Scale) with scores of: Right Colon = 3, Transverse                         Colon = 3 and Left Colon = 3 (entire mucosa seen well                         with no residual staining, small fragments of stool or                         opaque liquid). The total BBPS score equals 9. The                         ileocecal valve, appendiceal orifice, and rectum were                         photographed. Findings:      The perianal and digital rectal examinations were normal. Pertinent       negatives include normal sphincter tone and no palpable rectal lesions.      A 5 mm polyp was found in the ascending colon. The polyp was sessile.       The polyp was removed with a cold snare. Resection and retrieval were       complete. Estimated blood loss was minimal.      Multiple large-mouthed diverticula were found in the entire colon.      Multiple small angioectasias without bleeding were found in the proximal       rectum and in the recto-sigmoid colon.  The retroflexed view of the distal rectum and anal verge was normal and       showed no anal or rectal abnormalities. Impression:            - One 5 mm polyp in the ascending colon, removed with                         a cold snare. Resected and retrieved.                        - Diverticulosis in the entire examined colon.                        - Multiple non-bleeding colonic angioectasias.                        - The distal rectum and anal verge are normal on                         retroflexion view. Recommendation:        - Discharge patient to home (with escort).                        - Resume previous diet today.                        - Continue present medications.                        - Await pathology results.                        - Repeat colonoscopy in 5 years for surveillance. Procedure  Code(s):     --- Professional ---                        [id number], Colonoscopy, flexible; with removal of                         tumor(s), polyp(s), or other lesion(s) by snare                         technique Diagnosis Code(s):     --- Professional ---                        Z86.010, Personal history of colonic polyps                        K55.20, Angiodysplasia of colon without hemorrhage                        D12.2, Benign neoplasm of ascending colon                        K57.30, Diverticulosis of large intestine without                         perforation or abscess without bleeding CPT copyright 2022 American Medical Association. All rights reserved. The codes documented in this report are preliminary and upon coder review may  be revised to meet current  compliance requirements. Dr. Libby Maw Toney Reil MD, MD 01/03/2023 8:13:21 AM This report has been signed electronically. Number of Addenda: 0 Note Initiated On: 01/03/2023 7:13 AM Scope Withdrawal Time: 0 hours 10 minutes 36 seconds  Total Procedure Duration: 0 hours 17 minutes 53 seconds  Estimated Blood Loss:  Estimated blood loss: none.      [facility]

## 2023-01-03 NOTE — Anesthesia Preprocedure Evaluation (Signed)
Anesthesia Evaluation  Patient identified by MRN, date of birth, ID band Patient awake    Reviewed: Allergy & Precautions, NPO status , Patient's Chart, lab work & pertinent test results  History of Anesthesia Complications Negative for: history of anesthetic complications  Airway Mallampati: III  TM Distance: >3 FB Neck ROM: full    Dental no notable dental hx.    Pulmonary neg pulmonary ROS   Pulmonary exam normal        Cardiovascular hypertension, On Medications +CHF and + DOE (stable)  Normal cardiovascular exam+ dysrhythmias Atrial Fibrillation + Valvular Problems/Murmurs (s/p AVR) AS   Echo 11/23 INTERPRETATION ---------------------------------------------------------------    NORMAL LEFT VENTRICULAR SYSTOLIC FUNCTION WITH MILD LVH    NORMAL RIGHT VENTRICULAR SYSTOLIC FUNCTION    VALVULAR REGURGITATION: MILD MR, TRIVIAL PR, MILD TR    PROSTHETIC VALVE(S): BIOPROSTHETIC AoV    S/P AVR 12/15/2021     Neuro/Psych  PSYCHIATRIC DISORDERS  Depression    negative neurological ROS     GI/Hepatic negative GI ROS, Neg liver ROS,,,  Endo/Other  negative endocrine ROS    Renal/GU negative Renal ROS  negative genitourinary   Musculoskeletal   Abdominal   Peds  Hematology negative hematology ROS (+)   Anesthesia Other Findings Past Medical History: No date: Actinic keratosis No date: Aortic stenosis Nov. 2023: Blood transfusion without reported diagnosis     Comment:  While hospitalized @ [facility]. 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 No date: Heart murmur     Comment:  HX OF No date: History of cardioversion No date: Hyperlipidemia No date: Hypertension     Comment:  CONTROLLED ON  MEDS No date: Lymphedema No date: Melanoma (HCC)     Comment:  Left upper arm. 20 years ago No date: Orthostatic hypotension No date: Shortness of breath dyspnea  Past Surgical History: 02/27/2017: AORTIC VALVE REPLACEMENT; Bilateral 03/2022: AORTIC VALVE REPLACEMENT No date: CARDIAC CATHETERIZATION 02/27/2017: CARDIAC VALVE REPLACEMENT     Comment:  12/15/21 10/2019: CARDIOVERSION 05/24/2014: cataract surgery; Bilateral     Comment:  second eye 06/07/2014 09/24/2019: COLONOSCOPY WITH PROPOFOL; N/A     Comment:  Procedure: COLONOSCOPY WITH PROPOFOL;  Surgeon:               Pasty Spillers, MD;  Location: [facility] ENDOSCOPY;                Service: Gastroenterology;  Laterality: N/A; 12/10/2019: COLONOSCOPY WITH PROPOFOL; N/A     Comment:  Procedure: COLONOSCOPY WITH PROPOFOL;  Surgeon:               Pasty Spillers, MD;  Location: [facility] ENDOSCOPY;                Service: Endoscopy;  Laterality: N/A; No date: CORONARY ARTERY BYPASS GRAFT No date: EYE SURGERY No date: MELANOMA EXCISION No date: OVARIAN CYST REMOVAL     Comment:  EXPLORATORY LAPAROTOMY No date: TONSILLECTOMY  BMI    Body Mass Index: 37.16 kg/m      Reproductive/Obstetrics negative OB ROS                             Anesthesia Physical Anesthesia Plan  ASA: 3  Anesthesia Plan: General   Post-op Pain  Management: Minimal or no pain anticipated   Induction: Intravenous  PONV Risk Score and Plan: 2 and Propofol infusion and TIVA  Airway Management Planned: Natural Airway and Nasal Cannula  Additional Equipment:   Intra-op Plan:   Post-operative Plan:   Informed Consent: I have reviewed the patients History and Physical, chart, labs and discussed the procedure including the risks, benefits and alternatives for the proposed anesthesia with the patient or authorized representative who has indicated his/her understanding and acceptance.     Dental Advisory Given  Plan  Discussed with: Anesthesiologist, CRNA and Surgeon  Anesthesia Plan Comments: (Patient consented for risks of anesthesia including but not limited to:  - adverse reactions to medications - risk of airway placement if required - damage to eyes, teeth, lips or other oral mucosa - nerve damage due to positioning  - sore throat or hoarseness - Damage to heart, brain, nerves, lungs, other parts of body or loss of life  Patient voiced understanding and assent.)        Anesthesia Quick Evaluation

## 2023-01-04 ENCOUNTER — Encounter: Payer: Self-pay | Admitting: Gastroenterology

## 2023-01-04 LAB — SURGICAL PATHOLOGY

## 2023-01-04 NOTE — Progress Notes (Unsigned)
Name: Melody Soto   MRN: [id number]    DOB: 16-Nov-1950   Date:01/07/2023       Progress Note  Subjective  Chief Complaint  Follow Up  HPI  Discussed the use of AI scribe software for clinical note transcription with the patient, who gave verbal consent to proceed.  History of Present Illness   The patient, a 72 year old with a history of aortic valve replacement, congestive heart failure, and endometrial cancer, presents with worsening hip pain and depression. The hip pain, initially localized to the left side, has now extended to the lower back, suggestive of sciatica. The pain is severe enough to limit the patient's mobility and daily activities, such as bed making, vacuuming, and yard work. The patient has tried various treatments, including a corticosteroid injection, Celebrex, and chiropractic care, with minimal relief. The patient has also been self-managing the pain with arthritis strength Tylenol, taking four tablets once daily.  The patient's depression has worsened due to the chronic pain and limited mobility. The patient reports feelings of sadness and frustration due to the inability to perform daily tasks. Sleep has been affected, with the patient resorting to sleeping in a recliner or with multiple pillows for support. The patient has a history of major depression and has previously been on citalopram and duloxetine.  The patient also reports weight gain since her heart surgery, which she attributes to a lack of physical activity and a diet high in carbohydrates. The patient has a history of prediabetes, with a previous A1c of 6.1 that had decreased to 5.5. The patient has tried the keto diet in the past but found it too restrictive.  The patient's cardiovascular health appears to be stable, with no reported symptoms of congestive heart failure or atrial fibrillation. The patient is on multiple cardiac medications, including metoprolol, Jardiance, amiodarone, and Eliquis. The  patient has regular follow-ups with a cardiologist and a vascular surgeon.  The patient's lymphedema has reportedly worsened, contributing to her overall discomfort. The patient also has a history of endometrial cancer but is currently in remission. The patient's other past medical history includes aortic stenosis, for which she underwent aortic valve replacement, and a subclavian vein clot. The patient is on anticoagulation therapy with Eliquis for the clot.   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, symptoms stable   Aorta atherosclerosis: on statin therapy and we will recheck labs      Patient Active Problem List   Diagnosis Date Noted   Polyp of ascending colon 01/03/2023   Mild protein-calorie malnutrition (HCC) 05/15/2022   Thrombus of aorta (HCC) 05/15/2022   Morbid obesity (HCC) 05/15/2022   Hypercalcemia 07/28/2021   Chronic combined systolic and diastolic congestive heart failure (HCC) 07/28/2021   History of colon polyps 07/28/2021   Elevated hematocrit 07/28/2021   Vitamin D deficiency 07/28/2021   Hyperglycemia 07/28/2021   Skin lesion 07/28/2021   Mild major depression (HCC) 12/23/2020   Obesity (BMI 30.0-34.9) 12/23/2020   Thoracic aorta atherosclerosis (HCC) 12/23/2020   Subclavian vein occlusion, 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   Paroxysmal atrial fibrillation (HCC) 10/02/2019   Diverticulosis of colon 08/08/2018   Cholelithiasis 08/08/2018   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   Chronic acquired lymphedema 01/08/2017   FIGO stage II endometrial cancer (HCC) 10/29/2016   Left ventricular hypertrophy 11/27/2014   Benign essential HTN 08/21/2014   Dyslipidemia 08/21/2014   Cardiac dysrhythmia 08/21/2014   Dysmetabolic syndrome 08/21/2014   Female genuine stress incontinence 08/21/2014   History  of shingles 08/21/2014   Personal history of skin cancer 11/24/2009    Past Surgical History:  Procedure Laterality Date   AORTIC  VALVE REPLACEMENT Bilateral 02/27/2017   AORTIC VALVE REPLACEMENT  12/15/2021   CARDIAC CATHETERIZATION     CARDIAC VALVE REPLACEMENT  02/27/2017   12/15/21   CARDIOVERSION  10/2019   cataract surgery Bilateral 05/24/2014   second eye 06/07/2014   COLONOSCOPY WITH PROPOFOL N/A 09/24/2019   Procedure: COLONOSCOPY WITH PROPOFOL;  Surgeon: Pasty Spillers, MD;  Location: [facility];  Service: Gastroenterology;  Laterality: N/A;   COLONOSCOPY WITH PROPOFOL N/A 12/10/2019   Procedure: COLONOSCOPY WITH PROPOFOL;  Surgeon: Pasty Spillers, MD;  Location: [facility];  Service: Endoscopy;  Laterality: N/A;   COLONOSCOPY WITH PROPOFOL N/A 01/03/2023   Procedure: COLONOSCOPY WITH PROPOFOL;  Surgeon: Toney Reil, MD;  Location: [facility]COPY;  Service: Gastroenterology;  Laterality: N/A;   CORONARY ARTERY BYPASS GRAFT     EYE SURGERY     MELANOMA EXCISION     OVARIAN CYST REMOVAL     EXPLORATORY LAPAROTOMY   POLYPECTOMY  01/03/2023   Procedure: POLYPECTOMY INTESTINAL;  Surgeon: Toney Reil, MD;  Location: [facility];  Service: Gastroenterology;;   TONSILLECTOMY      Family History  Problem Relation Age of Onset   Stroke Mother    Thyroid nodules Mother    Lung cancer Mother    Cancer Mother    Hypertension Mother    Diabetes Sister    Hypertension Sister    Aortic aneurysm Father    Early death Father    Heart disease Father    Obesity Father    Stroke Father    Breast cancer Maternal Aunt    Bone cancer Maternal Uncle    Depression Daughter    Anxiety disorder Daughter    Depression Daughter     Social History   Tobacco Use   Smoking status: Never   Smokeless tobacco: Never  Substance Use Topics   Alcohol use: Not Currently     Current Outpatient Medications:    amiodarone (PACERONE) 200 MG tablet, Take 1 tablet (200 mg total) by mouth daily., Disp: 30 tablet, Rfl: 0   apixaban (ELIQUIS) 5 MG TABS tablet, Take 5 mg by mouth 2 (two)  times daily., Disp: , Rfl:    Ascorbic Acid (VITAMIN C) 1000 MG tablet, Take 1,000 mg by mouth in the morning and at bedtime., Disp: , Rfl:    atorvastatin (LIPITOR) 40 MG tablet, TAKE 1 TABLET (40 MG TOTAL) BY MOUTH 2 (TWO) TIMES A WEEK., Disp: 24 tablet, Rfl: 0   Coenzyme Q10 100 MG TABS, Take 100 mg by mouth daily., Disp: , Rfl:    furosemide (LASIX) 20 MG tablet, Take 20 mg by mouth daily., Disp: , Rfl:    JARDIANCE 10 MG TABS tablet, Take 10 mg by mouth daily., Disp: , Rfl:    Magnesium 500 MG CAPS, Take 800 mg by mouth in the morning and at bedtime. , Disp: , Rfl:    metoprolol succinate (TOPROL-XL) 50 MG 24 hr tablet, Take 50 mg by mouth 2 (two) times daily., Disp: , Rfl:    Potassium 99 MG TABS, Take 99 mg by mouth in the morning and at bedtime., Disp: , Rfl:    Vitamin D-Vitamin K (VITAMIN K2-VITAMIN D3 PO), Take 2 capsules by mouth daily. 5000 units Vitamin D3 + 90 mcg of Vitamin K2, Disp: , Rfl:    vitamin E 200  UNIT capsule, Take 200 Units by mouth 2 (two) times daily., Disp: , Rfl:    zinc gluconate 50 MG tablet, Take 50 mg by mouth daily., Disp: , Rfl:   Allergies  Allergen Reactions   Protamine Other (See Comments)    Severe hypotension refractory to pressor and volume administration, only reversed by administration of epinephrine.   Ace Inhibitors Other (See Comments)    UNKNOWN REACTION   Duloxetine Other (See Comments)    Drowsiness    I personally reviewed active problem list, medication list, allergies, family history, social history, health maintenance with the patient/caregiver today.   ROS  Ten systems reviewed and is negative except as mentioned in HPI    Objective  Vitals:   01/07/23 1342  BP: 128/72  Pulse: 79  Resp: 18  Temp: 97.8 F (36.6 C)  TempSrc: Oral  SpO2: 94%  Weight: 253 lb 14.4 oz (115.2 kg)  Height: 5' 8.5" (1.74 m)    Body mass index is 38.04 kg/m.  Physical Exam  Constitutional: Patient appears well-developed and  well-nourished. Obese  No distress.  HEENT: head atraumatic, normocephalic, pupils equal and reactive to light, neck supple Cardiovascular:  regular rhythm and normal heart sounds.  SEM Lymphedema both legs  B Pulmonary/Chest: Effort normal and breath sounds normal. No respiratory distress. Abdominal: Soft.  There is no tenderness. Psychiatric: Patient has a depressed mood , behavior is normal. Judgment and thought content normal.   Recent Results (from the past 2160 hour(s))  Surgical pathology     Status: None   Collection Time: 01/03/23 12:00 AM  Result Value Ref Range   SURGICAL PATHOLOGY      SURGICAL PATHOLOGY [facility] [address], [address] [place], [place] [address] Telephone [phone] or [phone] Fax [phone]  REPORT OF SURGICAL PATHOLOGY   Accession #: [id number] Patient Name: MICHAELANN, DACH Visit # : [id number]  MRN: [id number] Physician: Lannette Donath DOB/Age 15-Jun-1950 (Age: 47) Gender: F Collected Date: 01/03/2023 Received Date: 01/03/2023  FINAL DIAGNOSIS       1. Ascending  Colon Polyp, Cold snare :       -  TUBULAR ADENOMA, THREE FRAGMENTS.  NEGATIVE FOR HIGH GRADE DYSPLASIA OR      MALIGNANCY.      -  COLONIC MUCOSA, TWO FRAGMENTS.  NEGATIVE FOR DYSPLASIA OR MALIGNANCY.       DATE SIGNED OUT: 01/04/2023 ELECTRONIC SIGNATURE : [place] Md, Mark, Pathologist, Electronic Signature  MICROSCOPIC DESCRIPTION  CASE COMMENTS STAINS USED IN DIAGNOSIS: H&E    CLINICAL HISTORY  SPECIMEN(S) OBTAINED 1. Ascending  Colon Polyp, Cold Snare  SPECIMEN COMMENTS: SPECIMEN CLINICAL INFORMATION:  1. History of colon polyps.  Diverticulosis    Gross Description 1. In formalin are several irregular and polypoid tan pink soft tissues, 0.8 x 0.4 x 0.1 cm in aggregate.  Submitted in one block.    (SSW:kh 01/03/23)        Report signed out from the following location(s) [place] [address], [place] [address] CLIA #: [id number]  [facility] [address] [place], [place] [address] CLIA #: [id number]      PHQ2/9:    01/07/2023    1:44 PM 10/11/2022   11:44 AM 10/01/2022    9:26 AM 05/15/2022   10:48 AM 02/28/2022    3:32 PM  Depression screen PHQ 2/9  Decreased Interest 1 0 0 0 0  Down, Depressed, Hopeless 1 0  1 0 0  PHQ - 2 Score 2 0 1 0 0  Altered sleeping 0  0 0 0  Tired, decreased energy 1  0 0 0  Change in appetite 0  0 0 0  Feeling bad or failure about yourself  0  0 0 0  Trouble concentrating 0  0 0 0  Moving slowly or fidgety/restless 0  0 0 0  Suicidal thoughts 0  0 0 0  PHQ-9 Score 3  1 0 0  Difficult doing work/chores Somewhat difficult    Not difficult at all    phq 9 is positive   Fall Risk:    01/07/2023    1:44 PM 10/11/2022   11:38 AM 10/01/2022    9:26 AM 05/15/2022   10:48 AM 02/28/2022    9:09 AM  Fall Risk   Falls in the past year? 0 0 0 0 1  Comment     sick weak and dehydrated, sat on toilet, turned and slide off.  Number falls in past yr:  0 0 0 0  Injury with Fall?  0 0 0 0  Risk for fall due to : Impaired balance/gait No Fall Risks No Fall Risks No Fall Risks No Fall Risks;Medication side effect  Follow up Falls prevention discussed;Education provided;Falls evaluation completed Education provided;Falls prevention discussed Falls prevention discussed Falls prevention discussed Falls evaluation completed;Falls prevention discussed;Education provided      Functional Status Survey: Is the patient deaf or have difficulty hearing?: No Does the patient have difficulty seeing, even when wearing glasses/contacts?: No Does the patient have difficulty concentrating, remembering, or making decisions?: No Does the patient have difficulty walking or climbing stairs?: Yes Does the patient have difficulty dressing or bathing?: No Does the patient have difficulty doing errands alone such as visiting a doctor's office  or shopping?: No    Assessment & Plan  Assessment and Plan    Lower Back Pain / Sciatica Pain initially localized to left hip, now radiating to lower back. No improvement with Celebrex. Pain is impacting daily activities and contributing to depressive symptoms. -Continue Arthritis Strength Tylenol, 2 tablets in the morning and 2 in the afternoon, not exceeding 3 grams per day. -Consider physical therapy for trochanteric bursitis. -Patient to follow up with chiropractor for further evaluation and potential treatment.  Major Depression Exacerbation of depressive symptoms, likely related to chronic pain and limited mobility. History of previous treatment with citalopram and duloxetine. -Start Lexapro (Escitalopram) 10mg  daily for 30 days. -Follow up in 6 weeks to assess response to treatment.  Congestive Heart Failure / Atrial Fibrillation Stable, no new symptoms. Currently on Metoprolol, Jardiance, Amiodarone, and Eliquis. -Continue current medications. -Check thyroid function due to potential effects of Amiodarone.  Prediabetes Weight gain noted, potential impact on blood glucose control. Last A1C was 5.5. -Encourage return to healthier diet, mindful of carbohydrate intake. -Check A1C to monitor blood glucose control.  General Health Maintenance -Encourage mammogram, due in February. -Declined flu shot and shingles vaccine. -Follow up in early January to assess response to Lexapro and overall health status.

## 2023-01-07 ENCOUNTER — Encounter: Payer: Self-pay | Admitting: Gastroenterology

## 2023-01-07 ENCOUNTER — Ambulatory Visit (INDEPENDENT_AMBULATORY_CARE_PROVIDER_SITE_OTHER): Payer: Medicare HMO | Admitting: Family Medicine

## 2023-01-07 ENCOUNTER — Encounter: Payer: Self-pay | Admitting: Family Medicine

## 2023-01-07 VITALS — BP 128/72 | HR 79 | Temp 97.8°F | Resp 18 | Ht 68.5 in | Wt 253.9 lb

## 2023-01-07 DIAGNOSIS — F320 Major depressive disorder, single episode, mild: Secondary | ICD-10-CM | POA: Diagnosis not present

## 2023-01-07 DIAGNOSIS — E559 Vitamin D deficiency, unspecified: Secondary | ICD-10-CM | POA: Diagnosis not present

## 2023-01-07 DIAGNOSIS — I700 Atherosclerosis of aorta: Secondary | ICD-10-CM

## 2023-01-07 DIAGNOSIS — I10 Essential (primary) hypertension: Secondary | ICD-10-CM | POA: Diagnosis not present

## 2023-01-07 DIAGNOSIS — M7062 Trochanteric bursitis, left hip: Secondary | ICD-10-CM | POA: Diagnosis not present

## 2023-01-07 DIAGNOSIS — I480 Paroxysmal atrial fibrillation: Secondary | ICD-10-CM

## 2023-01-07 DIAGNOSIS — I5042 Chronic combined systolic (congestive) and diastolic (congestive) heart failure: Secondary | ICD-10-CM | POA: Diagnosis not present

## 2023-01-07 DIAGNOSIS — Z79899 Other long term (current) drug therapy: Secondary | ICD-10-CM

## 2023-01-07 DIAGNOSIS — I82B11 Acute embolism and thrombosis of right subclavian vein: Secondary | ICD-10-CM | POA: Diagnosis not present

## 2023-01-07 DIAGNOSIS — Z952 Presence of prosthetic heart valve: Secondary | ICD-10-CM | POA: Diagnosis not present

## 2023-01-07 DIAGNOSIS — R739 Hyperglycemia, unspecified: Secondary | ICD-10-CM

## 2023-01-07 DIAGNOSIS — Z23 Encounter for immunization: Secondary | ICD-10-CM

## 2023-01-07 MED ORDER — ESCITALOPRAM OXALATE 10 MG PO TABS: 10.0000 mg | ORAL_TABLET | Freq: Every day | ORAL | 1 refills | Status: DC

## 2023-01-08 LAB — LIPID PANEL
Cholesterol: 219 mg/dL — ABNORMAL HIGH (ref <200)
HDL: 79 mg/dL (ref >=50)
LDL Cholesterol (Calc): 114 mg/dL — ABNORMAL HIGH
Non-HDL Cholesterol (Calc): 140 mg/dL — ABNORMAL HIGH (ref <130)
Total CHOL/HDL Ratio: 2.8 (calc) (ref <5.0)
Triglycerides: 150 mg/dL — ABNORMAL HIGH (ref <150)

## 2023-01-08 LAB — CBC WITH DIFFERENTIAL/PLATELET
Absolute Lymphocytes: 1505 {cells}/uL (ref 850–3900)
Absolute Monocytes: 483 {cells}/uL (ref 200–950)
Basophils Absolute: 42 {cells}/uL (ref 0–200)
Basophils Relative: 0.6 %
Eosinophils Absolute: 49 {cells}/uL (ref 15–500)
Eosinophils Relative: 0.7 %
HCT: 46.5 % — ABNORMAL HIGH (ref 35.0–45.0)
Hemoglobin: 15.9 g/dL — ABNORMAL HIGH (ref 11.7–15.5)
MCH: 34.4 pg — ABNORMAL HIGH (ref 27.0–33.0)
MCHC: 34.2 g/dL (ref 32.0–36.0)
MCV: 100.6 fL — ABNORMAL HIGH (ref 80.0–100.0)
MPV: 9.8 fL (ref 7.5–12.5)
Monocytes Relative: 6.9 %
Neutro Abs: 4921 {cells}/uL (ref 1500–7800)
Neutrophils Relative %: 70.3 %
Platelets: 324 10*3/uL (ref 140–400)
RBC: 4.62 10*6/uL (ref 3.80–5.10)
RDW: 12.9 % (ref 11.0–15.0)
Total Lymphocyte: 21.5 %
WBC: 7 10*3/uL (ref 3.8–10.8)

## 2023-01-08 LAB — HEMOGLOBIN A1C
Hgb A1c MFr Bld: 5.7 %{Hb} — ABNORMAL HIGH (ref <5.7)
Mean Plasma Glucose: 117 mg/dL
eAG (mmol/L): 6.5 mmol/L

## 2023-01-08 LAB — COMPLETE METABOLIC PANEL WITH GFR
AG Ratio: 1.6 (calc) (ref 1.0–2.5)
ALT: 20 U/L (ref 6–29)
AST: 17 U/L (ref 10–35)
Albumin: 4.5 g/dL (ref 3.6–5.1)
Alkaline phosphatase (APISO): 102 U/L (ref 37–153)
BUN: 19 mg/dL (ref 7–25)
CO2: 26 mmol/L (ref 20–32)
Calcium: 10.7 mg/dL — ABNORMAL HIGH (ref 8.6–10.4)
Chloride: 105 mmol/L (ref 98–110)
Creat: 0.82 mg/dL (ref 0.60–1.00)
Globulin: 2.9 g/dL (ref 1.9–3.7)
Glucose, Bld: 111 mg/dL — ABNORMAL HIGH (ref 65–99)
Potassium: 4.4 mmol/L (ref 3.5–5.3)
Sodium: 141 mmol/L (ref 135–146)
Total Bilirubin: 0.6 mg/dL (ref 0.2–1.2)
Total Protein: 7.4 g/dL (ref 6.1–8.1)
eGFR: 76 mL/min/{1.73_m2} (ref >=60)

## 2023-01-08 LAB — TSH: TSH: 0.56 m[IU]/L (ref 0.40–4.50)

## 2023-01-08 LAB — VITAMIN D 25 HYDROXY (VIT D DEFICIENCY, FRACTURES): Vit D, 25-Hydroxy: 84 ng/mL (ref 30–100)

## 2023-01-30 ENCOUNTER — Other Ambulatory Visit: Payer: Self-pay | Admitting: Family Medicine

## 2023-01-30 DIAGNOSIS — F320 Major depressive disorder, single episode, mild: Secondary | ICD-10-CM

## 2023-02-20 ENCOUNTER — Encounter: Payer: Self-pay | Admitting: Family Medicine

## 2023-02-20 ENCOUNTER — Ambulatory Visit (INDEPENDENT_AMBULATORY_CARE_PROVIDER_SITE_OTHER): Payer: Medicare HMO | Admitting: Family Medicine

## 2023-02-20 VITALS — BP 138/82 | HR 66 | Resp 18 | Ht 68.5 in | Wt 259.2 lb

## 2023-02-20 DIAGNOSIS — E785 Hyperlipidemia, unspecified: Secondary | ICD-10-CM

## 2023-02-20 DIAGNOSIS — R2689 Other abnormalities of gait and mobility: Secondary | ICD-10-CM

## 2023-02-20 DIAGNOSIS — F325 Major depressive disorder, single episode, in full remission: Secondary | ICD-10-CM | POA: Diagnosis not present

## 2023-02-20 DIAGNOSIS — M7062 Trochanteric bursitis, left hip: Secondary | ICD-10-CM

## 2023-02-20 DIAGNOSIS — I480 Paroxysmal atrial fibrillation: Secondary | ICD-10-CM

## 2023-02-20 MED ORDER — ATORVASTATIN CALCIUM 40 MG PO TABS: 40.0000 mg | ORAL_TABLET | ORAL | 0 refills | Status: DC

## 2023-02-20 MED ORDER — ESCITALOPRAM OXALATE 10 MG PO TABS: 10.0000 mg | ORAL_TABLET | Freq: Every day | ORAL | 0 refills | Status: DC

## 2023-02-20 NOTE — Progress Notes (Signed)
 Name: Melody Soto   MRN: [id number]    DOB: Apr 30, 1950   Date:02/20/2023       Progress Note  Subjective  Chief Complaint  Chief Complaint  Patient presents with   Depression    Pt states been better since has company at home    HPI  Discussed the use of AI scribe software for clinical note transcription with the patient, who gave verbal consent to proceed.  History of Present Illness   The patient, with a history of depression, pain, and limited mobility, returned for a six-week follow-up after initiating Lexapro  10 mg . She reported initial fatigue with the medication, prompting a shift to evening dosing. Over the past six weeks, she noticed no dramatic changes but acknowledged an overall improvement in mood. She attributed this improvement not only to the medication but also to positive changes in her family situation, including the return of her ex-husband and increased family gatherings.  The patient also reported ongoing issues with her left hip, causing significant pain and necessitating the use of a cane for mobility. She has been seeking chiropractic treatment for the past three weeks, with variable results. The pain is localized to the left hip and does not radiate down the leg. She expressed a desire to continue with less invasive treatments before considering orthopedic consultation.  In addition to her physical health, the patient discussed her strained relationship with one of her daughters ( younger ) , who has distanced herself from the family. This situation has caused emotional distress for the patient, but she is learning to respect her daughter's choices while maintaining her love for her.  The patient is also on amiodarone  for a heart rhythm issue, which is managed by her cardiologist. She is due for a follow-up with the cardiologist in February. She also takes atorvastatin  twice a week for cholesterol management. She is worried that joint pains related to statin  therapy but not sure if symptoms occurs after taking medication. Advised to keep a log         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   Obesity (BMI 30.0-34.9) 12/23/2020   Thoracic aorta atherosclerosis (HCC) 12/23/2020   Subclavian vein occlusion, 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   Paroxysmal atrial fibrillation (HCC) 10/02/2019   Diverticulosis of colon 08/08/2018   Cholelithiasis 08/08/2018   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   Chronic acquired lymphedema 01/08/2017   FIGO stage II endometrial cancer (HCC) 10/29/2016   Left ventricular hypertrophy 11/27/2014   Benign essential HTN 08/21/2014   Dyslipidemia 08/21/2014   Cardiac dysrhythmia 08/21/2014   Dysmetabolic syndrome 08/21/2014   Female genuine stress incontinence 08/21/2014   History of shingles 08/21/2014   Personal history of skin cancer 11/24/2009    Past Surgical History:  Procedure Laterality Date   AORTIC VALVE REPLACEMENT Bilateral 02/27/2017   AORTIC VALVE REPLACEMENT  12/15/2021   CARDIAC CATHETERIZATION     CARDIAC VALVE REPLACEMENT  02/27/2017   12/15/21   CARDIOVERSION  10/2019   cataract surgery Bilateral 05/24/2014  second eye 06/07/2014   COLONOSCOPY WITH PROPOFOL  N/A 09/24/2019   Procedure: COLONOSCOPY WITH PROPOFOL ;  Surgeon: Janalyn Keene NOVAK, MD;  Location: [facility];  Service: Gastroenterology;  Laterality: N/A;   COLONOSCOPY WITH PROPOFOL  N/A 12/10/2019   Procedure: COLONOSCOPY WITH  PROPOFOL ;  Surgeon: Janalyn Keene NOVAK, MD;  Location: [facility];  Service: Endoscopy;  Laterality: N/A;   COLONOSCOPY WITH PROPOFOL  N/A 01/03/2023   Procedure: COLONOSCOPY WITH PROPOFOL ;  Surgeon: Unk Corinn Skiff, MD;  Location: [facility]COPY;  Service: Gastroenterology;  Laterality: N/A;   CORONARY ARTERY BYPASS GRAFT     EYE SURGERY     MELANOMA EXCISION     OVARIAN CYST REMOVAL     EXPLORATORY LAPAROTOMY   POLYPECTOMY  01/03/2023   Procedure: POLYPECTOMY INTESTINAL;  Surgeon: Unk Corinn Skiff, MD;  Location: [facility] ENDOSCOPY;  Service: Gastroenterology;;   TONSILLECTOMY      Family History  Problem Relation Age of Onset   Stroke Mother    Thyroid  nodules Mother    Lung cancer Mother    Cancer Mother    Hypertension Mother    Diabetes Sister    Hypertension Sister    Aortic aneurysm Father    Early death Father    Heart disease Father    Obesity Father    Stroke Father    Breast cancer Maternal Aunt    Bone cancer Maternal Uncle    Depression Daughter    Anxiety disorder Daughter    Depression Daughter     Social History   Tobacco Use   Smoking status: Never   Smokeless tobacco: Never  Substance Use Topics   Alcohol use: Not Currently     Current Outpatient Medications:    amiodarone  (PACERONE ) 200 MG tablet, Take 1 tablet (200 mg total) by mouth daily., Disp: 30 tablet, Rfl: 0   apixaban (ELIQUIS) 5 MG TABS tablet, Take 5 mg by mouth 2 (two) times daily., Disp: , Rfl:    Ascorbic Acid (VITAMIN C) 1000 MG tablet, Take 1,000 mg by mouth in the morning and at bedtime., Disp: , Rfl:    atorvastatin  (LIPITOR) 40 MG tablet, TAKE 1 TABLET (40 MG TOTAL) BY MOUTH 2 (TWO) TIMES A WEEK., Disp: 24 tablet, Rfl: 0   Coenzyme Q10 100 MG TABS, Take 100 mg by mouth daily., Disp: , Rfl:    escitalopram  (LEXAPRO ) 10 MG tablet, TAKE 1 TABLET BY MOUTH EVERY DAY, Disp: 90 tablet, Rfl: 0   furosemide (LASIX) 20 MG tablet, Take 20 mg by mouth daily., Disp: , Rfl:     JARDIANCE 10 MG TABS tablet, Take 10 mg by mouth daily., Disp: , Rfl:    Magnesium 500 MG CAPS, Take 800 mg by mouth in the morning and at bedtime. , Disp: , Rfl:    metoprolol  succinate (TOPROL -XL) 50 MG 24 hr tablet, Take 50 mg by mouth 2 (two) times daily., Disp: , Rfl:    Potassium 99 MG TABS, Take 99 mg by mouth in the morning and at bedtime., Disp: , Rfl:    Vitamin D -Vitamin K (VITAMIN K2-VITAMIN D3 PO), Take 2 capsules by mouth daily. 5000 units Vitamin D3 + 90 mcg of Vitamin K2, Disp: , Rfl:    vitamin E 200 UNIT capsule, Take 200 Units by mouth 2 (two) times daily., Disp: , Rfl:    zinc gluconate 50 MG tablet, Take 50 mg by mouth daily., Disp: , Rfl:   Allergies  Allergen Reactions   Protamine Other (See Comments)  Severe hypotension refractory to pressor and volume administration, only reversed by administration of epinephrine.   Ace Inhibitors Other (See Comments)    UNKNOWN REACTION   Duloxetine  Other (See Comments)    Drowsiness    I personally reviewed active problem list, medication list, allergies, family history with the patient/caregiver today.   ROS  Ten systems reviewed and is negative except as mentioned in HPI    Objective  Vitals:   02/20/23 1043  BP: 138/82  Pulse: 66  Resp: 18  SpO2: 94%  Weight: 259 lb 3.2 oz (117.6 kg)  Height: 5' 8.5 (1.74 m)    Body mass index is 38.84 kg/m.  Physical Exam  Constitutional: Patient appears well-developed and well-nourished. Obese  No distress.  HEENT: head atraumatic, normocephalic, pupils equal and reactive to light, neck supple Cardiovascular: Normal rate, regular rhythm and normal heart sounds.  No murmur heard. No BLE edema. Pulmonary/Chest: Effort normal and breath sounds normal. No respiratory distress. Abdominal: Soft.  There is no tenderness. Muscular skeletal: using a cane, pain on left outer hip and sciatic notch Psychiatric: Patient has a normal mood and affect. behavior is normal. Judgment  and thought content normal.   Recent Results (from the past 2160 hours)  Surgical pathology     Status: None   Collection Time: 01/03/23 12:00 AM  Result Value Ref Range   SURGICAL PATHOLOGY      SURGICAL PATHOLOGY [facility] [address], [address] [place], [place] [address] Telephone [phone] or [phone] Fax [phone]  REPORT OF SURGICAL PATHOLOGY   Accession #: [id number] Patient Name: TANEISHA, FUSON Visit # : [id number]  MRN: [id number] Physician: Unk Cotton DOB/Age [birth year] (Age: 44) Gender: F Collected Date: 01/03/2023 Received Date: 01/03/2023  FINAL DIAGNOSIS       1. Ascending  Colon Polyp, Cold snare :       -  TUBULAR ADENOMA, THREE FRAGMENTS.  NEGATIVE FOR HIGH GRADE DYSPLASIA OR      MALIGNANCY.      -  COLONIC MUCOSA, TWO FRAGMENTS.  NEGATIVE FOR DYSPLASIA OR MALIGNANCY.       DATE SIGNED OUT: 01/04/2023 ELECTRONIC SIGNATURE : Jordan Md, Mark, Pathologist, Electronic Signature  MICROSCOPIC DESCRIPTION  CASE COMMENTS STAINS USED IN DIAGNOSIS: H&E    CLINICAL HISTORY  SPECIMEN(S) OBTAINED 1. Ascending  Colon Polyp, Cold Snare  SPECIMEN COMMENTS: SPECIMEN CLINICAL INFORMATION:  1. History of colon polyps.  Diverticulosis    Gross Description 1. In formalin are several irregular and polypoid tan pink soft tissues, 0.8 x 0.4 x 0.1 cm in aggregate.  Submitted in one block.    (SSW:kh 01/03/23)        Report signed out from the following location(s) .  HOSPITAL [address] [place], [place] [address] CLIA #: [id number]  [facility] [address] [place], [place] [address] CLIA #: [id number]   Lipid panel     Status: Abnormal   Collection Time: 01/07/23  2:27 PM  Result Value Ref Range   Cholesterol 219 (H) <200 mg/dL   HDL 79 > OR = 50 mg/dL   Triglycerides 849 (H) <150 mg/dL   LDL Cholesterol (Calc) 114 (H) mg/dL (calc)    Comment: Reference  range: <100 . Desirable range <100 mg/dL for primary prevention;   <70 mg/dL for patients with CHD or diabetic patients  with > or = 2 CHD risk factors. SABRA LDL-C is now calculated using the Martin-Hopkins  calculation,  which is a validated novel method providing  better accuracy than the Friedewald equation in the  estimation of LDL-C.  Gladis APPLETHWAITE et al. SANDREA. 7986;689(80): 2061-2068  (http://education.[facility].com/faq/FAQ164)    Total CHOL/HDL Ratio 2.8 <5.0 (calc)   Non-HDL Cholesterol (Calc) 140 (H) <130 mg/dL (calc)    Comment: For patients with diabetes plus 1 major ASCVD risk  factor, treating to a non-HDL-C goal of <100 mg/dL  (LDL-C of <29 mg/dL) is considered a therapeutic  option.   CBC with Differential/Platelet     Status: Abnormal   Collection Time: 01/07/23  2:27 PM  Result Value Ref Range   WBC 7.0 3.8 - 10.8 Thousand/uL   RBC 4.62 3.80 - 5.10 Million/uL   Hemoglobin 15.9 (H) 11.7 - 15.5 g/dL   HCT 53.4 (H) 64.9 - 54.9 %   MCV 100.6 (H) 80.0 - 100.0 fL   MCH 34.4 (H) 27.0 - 33.0 pg   MCHC 34.2 32.0 - 36.0 g/dL    Comment: For adults, a slight decrease in the calculated MCHC value (in the range of 30 to 32 g/dL) is most likely not clinically significant; however, it should be interpreted with caution in correlation with other red cell parameters and the patient's clinical condition.    RDW 12.9 11.0 - 15.0 %   Platelets 324 140 - 400 Thousand/uL   MPV 9.8 7.5 - 12.5 fL   Neutro Abs 4,921 1,500 - 7,800 cells/uL   Absolute Lymphocytes 1,505 850 - 3,900 cells/uL   Absolute Monocytes 483 200 - 950 cells/uL   Eosinophils Absolute 49 15 - 500 cells/uL   Basophils Absolute 42 0 - 200 cells/uL   Neutrophils Relative % 70.3 %   Total Lymphocyte 21.5 %   Monocytes Relative 6.9 %   Eosinophils Relative 0.7 %   Basophils Relative 0.6 %  COMPLETE METABOLIC PANEL WITH GFR     Status: Abnormal   Collection Time: 01/07/23  2:27 PM  Result Value Ref Range    Glucose, Bld 111 (H) 65 - 99 mg/dL    Comment: .            Fasting reference interval . For someone without known diabetes, a glucose value between 100 and 125 mg/dL is consistent with prediabetes and should be confirmed with a follow-up test. .    BUN 19 7 - 25 mg/dL   Creat 9.17 9.39 - 8.99 mg/dL   eGFR 76 > OR = 60 fO/fpw/8.26f7   BUN/Creatinine Ratio SEE NOTE: 6 - 22 (calc)    Comment:    Not Reported: BUN and Creatinine are within    reference range. .    Sodium 141 135 - 146 mmol/L   Potassium 4.4 3.5 - 5.3 mmol/L   Chloride 105 98 - 110 mmol/L   CO2 26 20 - 32 mmol/L   Calcium  10.7 (H) 8.6 - 10.4 mg/dL   Total Protein 7.4 6.1 - 8.1 g/dL   Albumin 4.5 3.6 - 5.1 g/dL   Globulin 2.9 1.9 - 3.7 g/dL (calc)   AG Ratio 1.6 1.0 - 2.5 (calc)   Total Bilirubin 0.6 0.2 - 1.2 mg/dL   Alkaline phosphatase (APISO) 102 37 - 153 U/L   AST 17 10 - 35 U/L   ALT 20 6 - 29 U/L  Hemoglobin A1c     Status: Abnormal   Collection Time: 01/07/23  2:27 PM  Result Value Ref Range   Hgb A1c MFr Bld 5.7 (H) <5.7 % of total Hgb  Comment: For someone without known diabetes, a hemoglobin  A1c value between 5.7% and 6.4% is consistent with prediabetes and should be confirmed with a  follow-up test. . For someone with known diabetes, a value <7% indicates that their diabetes is well controlled. A1c targets should be individualized based on duration of diabetes, age, comorbid conditions, and other considerations. . This assay result is consistent with an increased risk of diabetes. . Currently, no consensus exists regarding use of hemoglobin A1c for diagnosis of diabetes for children. .    Mean Plasma Glucose 117 mg/dL   eAG (mmol/L) 6.5 mmol/L  VITAMIN D  25 Hydroxy (Vit-D Deficiency, Fractures)     Status: None   Collection Time: 01/07/23  2:27 PM  Result Value Ref Range   Vit D, 25-Hydroxy 84 30 - 100 ng/mL    Comment: Vitamin D  Status         25-OH Vitamin D : . Deficiency:                     <20 ng/mL Insufficiency:             20 - 29 ng/mL Optimal:                 > or = 30 ng/mL . For 25-OH Vitamin D  testing on patients on  D2-supplementation and patients for whom quantitation  of D2 and D3 fractions is required, the [facility](TM) 25-OH VIT D, (D2,D3), LC/MS/MS is recommended: order  code 07111 (patients >68yrs). . See Note 1 . Note 1 . For additional information, please refer to  http://education.[facility].com/faq/FAQ199  (This link is being provided for informational/ educational purposes only.)   TSH     Status: None   Collection Time: 01/07/23  2:27 PM  Result Value Ref Range   TSH 0.56 0.40 - 4.50 mIU/L    Diabetic Foot Exam:     PHQ2/9:    02/20/2023   10:42 AM 01/07/2023    1:44 PM 10/11/2022   11:44 AM 10/01/2022    9:26 AM 05/15/2022   10:48 AM  Depression screen PHQ 2/9  Decreased Interest 0 1 0 0 0  Down, Depressed, Hopeless 0 1 0 1 0  PHQ - 2 Score 0 2 0 1 0  Altered sleeping 0 0  0 0  Tired, decreased energy 0 1  0 0  Change in appetite 0 0  0 0  Feeling bad or failure about yourself  0 0  0 0  Trouble concentrating 0 0  0 0  Moving slowly or fidgety/restless 0 0  0 0  Suicidal thoughts 0 0  0 0  PHQ-9 Score 0 3  1 0  Difficult doing work/chores Not difficult at all Somewhat difficult       phq 9 is negative   Fall Risk:    02/20/2023   10:38 AM 01/07/2023    1:44 PM 10/11/2022   11:38 AM 10/01/2022    9:26 AM 05/15/2022   10:48 AM  Fall Risk   Falls in the past year? 0 0 0 0 0  Number falls in past yr: 0  0 0 0  Injury with Fall? 0  0 0 0  Risk for fall due to : No Fall Risks Impaired balance/gait No Fall Risks No Fall Risks No Fall Risks  Follow up Falls prevention discussed;Education provided;Falls evaluation completed Falls prevention discussed;Education provided;Falls evaluation completed Education provided;Falls prevention discussed Falls prevention discussed Falls prevention discussed  Assessment  & Plan  Assessment and Plan    Depression Major in Remission Improved mood since starting Lexapro  2mg  daily 6 weeks ago. Noted improved family situation and increased social support. No current suicidal ideation or intent. -Continue Lexapro  10 mg daily. -Consider reducing dose to 1mg  daily during summer months, but increase back to 10 mg daily in the fall without needing to consult the physician.  Hyperlipidemia Patient is currently on Atorvastatin  twice weekly. Last cholesterol check showed improvement but still not at goal. Patient has concerns about potential muscle and joint pain related to statin use. -Continue Atorvastatin  twice weekly. -Encourage patient to keep a symptom diary to monitor for any correlation between medication use and symptom exacerbation.  Chronic Pain/trochanteric bursitis Patient reports persistent left hip pain, currently seeing a chiropractor for management. No radiation of pain down the legs. -Continue with chiropractic treatment. -Consider referral to orthopedics if no improvement with current treatment.  Paroxysmal Afib Patient is on Amiodarone , prescribed by cardiologist. -Remind patient to follow up with cardiologist for medication management.  Family Dynamics Patient reports strained relationship with one daughter, causing emotional distress. -Encourage patient to continue expressing love and support for daughter despite current circumstances.  Mobility Patient reports difficulty with mobility and is using a cane for support. Expressed interest in obtaining a handicap parking permit. -Fill out form for handicap parking permit for patient to take to [facility].  Follow-up in 6 months.

## 2023-03-04 ENCOUNTER — Ambulatory Visit: Payer: Medicare HMO | Admitting: Dermatology

## 2023-04-16 ENCOUNTER — Encounter: Payer: Self-pay | Admitting: Dermatology

## 2023-04-16 ENCOUNTER — Ambulatory Visit (INDEPENDENT_AMBULATORY_CARE_PROVIDER_SITE_OTHER): Payer: Medicare HMO | Admitting: Dermatology

## 2023-04-16 DIAGNOSIS — L578 Other skin changes due to chronic exposure to nonionizing radiation: Secondary | ICD-10-CM

## 2023-04-16 DIAGNOSIS — D22111 Melanocytic nevi of right upper eyelid, including canthus: Secondary | ICD-10-CM

## 2023-04-16 DIAGNOSIS — L814 Other melanin hyperpigmentation: Secondary | ICD-10-CM

## 2023-04-16 DIAGNOSIS — L821 Other seborrheic keratosis: Secondary | ICD-10-CM | POA: Diagnosis not present

## 2023-04-16 DIAGNOSIS — D229 Melanocytic nevi, unspecified: Secondary | ICD-10-CM | POA: Diagnosis not present

## 2023-04-16 DIAGNOSIS — L738 Other specified follicular disorders: Secondary | ICD-10-CM

## 2023-04-16 DIAGNOSIS — L820 Inflamed seborrheic keratosis: Secondary | ICD-10-CM | POA: Diagnosis not present

## 2023-04-16 DIAGNOSIS — Z1283 Encounter for screening for malignant neoplasm of skin: Secondary | ICD-10-CM

## 2023-04-16 DIAGNOSIS — Z85820 Personal history of malignant melanoma of skin: Secondary | ICD-10-CM | POA: Diagnosis not present

## 2023-04-16 DIAGNOSIS — W908XXA Exposure to other nonionizing radiation, initial encounter: Secondary | ICD-10-CM | POA: Diagnosis not present

## 2023-04-16 DIAGNOSIS — D1801 Hemangioma of skin and subcutaneous tissue: Secondary | ICD-10-CM

## 2023-04-16 NOTE — Progress Notes (Signed)
 Follow-Up Visit   Subjective  Melody Soto is a 73 y.o. female who presents for the following: Skin Cancer Screening and Upper Body Skin Exam. Hx of MM, >20 years ago.   The patient presents for Total-Body Skin Exam (TBSE) for skin cancer screening and mole check. The patient has spots, moles and lesions to be evaluated, some may be new or changing and the patient may have concern these could be cancer. Couple irritated spots on chest area.    The following portions of the chart were reviewed this encounter and updated as appropriate: medications, allergies, medical history  Review of Systems:  No other skin or systemic complaints except as noted in HPI or Assessment and Plan.  Objective  Well appearing patient in no apparent distress; mood and affect are within normal limits.  All areas waist up examined today.  All findings within normal limits unless otherwise noted below.   Relevant physical exam findings are noted in the Assessment and Plan.         R upper sternum x1, L anterior axilla x1 (2) Erythematous keratotic or waxy stuck-on papule or plaque.  Assessment & Plan   SKIN CANCER SCREENING PERFORMED TODAY.   HISTORY OF MELANOMA. Left upper arm >20 year ago.  - No evidence of recurrence today - Recommend regular full body skin exams - Recommend daily broad spectrum sunscreen SPF 30+ to sun-exposed areas, reapply every 2 hours as needed.  - Call if any new or changing lesions are noted between office visits   ACTINIC DAMAGE. Chest. - Chronic condition, secondary to cumulative UV/sun exposure - diffuse scaly erythematous macules with underlying dyspigmentation - Recommend daily broad spectrum sunscreen SPF 30+ to sun-exposed areas, reapply every 2 hours as needed.  - Staying in the shade or wearing long sleeves, sun glasses (UVA+UVB protection) and wide brim hats (4-inch brim around the entire circumference of the hat) are also recommended for sun protection.   - Call for new or changing lesions.  LENTIGINES, SEBORRHEIC KERATOSES, HEMANGIOMAS - Benign normal skin lesions. Torso, face, arms. - SK- large brown keratotic plaque at right medial breast. - Benign-appearing - Call for any changes   MELANOCYTIC NEVI - Tan-brown and/or pink-flesh-colored symmetric  macules and papules - 0.6 cm flesh papule at right upper eyelid, pt states present for years, no changes - Benign appearing on exam today - Observation - Call clinic for new or changing moles - Recommend daily use of broad spectrum spf 30+ sunscreen to sun-exposed areas.    Sebaceous Hyperplasia - 4 mm yellow-pink papule at left medial cheek. - 3 mm pink-yellow papule left lateral lower cutaneous lip- irritated (pt picks at it). - Small yellow papules with a central dell at forehead - Benign-appearing. Stable compared to previous visit. Discussed shave removal if irritated, pt defers at this time.  - Observation.  Call clinic for new or changing moles.  Recommend daily use of broad spectrum spf 30+ sunscreen to sun-exposed areas.      INFLAMED SEBORRHEIC KERATOSIS (2) R upper sternum x1, L anterior axilla x1 (2) Symptomatic, irritating, patient would like treated. Destruction of lesion - R upper sternum x1, L anterior axilla x1 (2)  Destruction method: cryotherapy   Informed consent: discussed and consent obtained   Lesion destroyed using liquid nitrogen: Yes   Region frozen until ice ball extended beyond lesion: Yes   Outcome: patient tolerated procedure well with no complications   Post-procedure details: wound care instructions given   Additional  details:  Prior to procedure, discussed risks of blister formation, small wound, skin dyspigmentation, or rare scar following cryotherapy. Recommend Vaseline ointment to treated areas while healing.    Return in about 1 year (around 04/15/2024) for TBSE, HxMM, HxAKs.  I, Lawson Radar, CMA, am acting as scribe for Willeen Niece,  MD.   Documentation: I have reviewed the above documentation for accuracy and completeness, and I agree with the above.  Willeen Niece, MD

## 2023-04-16 NOTE — Patient Instructions (Addendum)

## 2023-05-02 DIAGNOSIS — Z952 Presence of prosthetic heart valve: Secondary | ICD-10-CM | POA: Diagnosis not present

## 2023-05-02 DIAGNOSIS — I10 Essential (primary) hypertension: Secondary | ICD-10-CM | POA: Diagnosis not present

## 2023-05-02 DIAGNOSIS — I480 Paroxysmal atrial fibrillation: Secondary | ICD-10-CM | POA: Diagnosis not present

## 2023-05-02 DIAGNOSIS — E782 Mixed hyperlipidemia: Secondary | ICD-10-CM | POA: Diagnosis not present

## 2023-05-17 ENCOUNTER — Other Ambulatory Visit: Payer: Self-pay | Admitting: Family Medicine

## 2023-05-17 DIAGNOSIS — E785 Hyperlipidemia, unspecified: Secondary | ICD-10-CM

## 2023-07-10 ENCOUNTER — Ambulatory Visit: Payer: Self-pay | Admitting: Family Medicine

## 2023-07-18 ENCOUNTER — Encounter: Payer: Self-pay | Admitting: Family Medicine

## 2023-07-18 ENCOUNTER — Ambulatory Visit: Admitting: Family Medicine

## 2023-07-18 VITALS — BP 138/82 | HR 66 | Resp 16 | Ht 68.5 in | Wt 258.7 lb

## 2023-07-18 DIAGNOSIS — Z952 Presence of prosthetic heart valve: Secondary | ICD-10-CM | POA: Diagnosis not present

## 2023-07-18 DIAGNOSIS — E785 Hyperlipidemia, unspecified: Secondary | ICD-10-CM | POA: Diagnosis not present

## 2023-07-18 DIAGNOSIS — I480 Paroxysmal atrial fibrillation: Secondary | ICD-10-CM

## 2023-07-18 DIAGNOSIS — Z1231 Encounter for screening mammogram for malignant neoplasm of breast: Secondary | ICD-10-CM

## 2023-07-18 DIAGNOSIS — I5042 Chronic combined systolic (congestive) and diastolic (congestive) heart failure: Secondary | ICD-10-CM | POA: Diagnosis not present

## 2023-07-18 DIAGNOSIS — I700 Atherosclerosis of aorta: Secondary | ICD-10-CM | POA: Diagnosis not present

## 2023-07-18 DIAGNOSIS — I82B11 Acute embolism and thrombosis of right subclavian vein: Secondary | ICD-10-CM

## 2023-07-18 DIAGNOSIS — I10 Essential (primary) hypertension: Secondary | ICD-10-CM | POA: Diagnosis not present

## 2023-07-18 DIAGNOSIS — M85851 Other specified disorders of bone density and structure, right thigh: Secondary | ICD-10-CM

## 2023-07-18 DIAGNOSIS — M25552 Pain in left hip: Secondary | ICD-10-CM

## 2023-07-18 DIAGNOSIS — F320 Major depressive disorder, single episode, mild: Secondary | ICD-10-CM | POA: Diagnosis not present

## 2023-07-18 DIAGNOSIS — G8929 Other chronic pain: Secondary | ICD-10-CM

## 2023-07-18 DIAGNOSIS — I7410 Embolism and thrombosis of unspecified parts of aorta: Secondary | ICD-10-CM | POA: Diagnosis not present

## 2023-07-18 MED ORDER — ATORVASTATIN CALCIUM 40 MG PO TABS: 40.0000 mg | ORAL_TABLET | ORAL | 1 refills | Status: DC

## 2023-07-18 NOTE — Progress Notes (Unsigned)
 Name: Melody Soto   MRN: [id number]    DOB: [birth year]   Date:07/18/2023       Progress Note  Subjective  Chief Complaint  Chief Complaint  Patient presents with   Medical Management of Chronic Issues   Discussed the use of AI scribe software for clinical note transcription with the patient, who gave verbal consent to proceed.  History of Present Illness Melody Soto "Melody Soto" is a 73 year old female with hypertension and congestive heart failure who presents for a regular follow-up visit.  She has experienced elevated blood pressure recently, with the last reading at 148/90 mmHg, higher than previous measurements. Despite owning two blood pressure machines, she has not been monitoring her blood pressure at home. She attributes the increase to stress and ongoing pain issues.  She reports significant left hip pain, described as severe and worsening with walking, but improving with sitting. The pain radiates to the lateral side of her knee, groin, and buttock. She suspects it might be related to sciatica, as suggested by a friend, but has not consulted an orthopedic specialist. The pain limits her mobility and contributes to her stress.  Her current medications include metoprolol  50 mg twice daily, Lasix 20 mg daily, and Jardiance 10 mg daily. She also takes atorvastatin  twice a week, amiodarone , and Eliquis. She discontinued citalopram due to ineffectiveness and adverse effects. She takes Celebrex  for inflammation but finds it ineffective for pain relief, and uses Tylenol  Arthritis 650 mg, taking four tablets once a day, which provides some relief.  Regarding her congestive heart failure, she denies symptoms such as dry cough, shortness of breath, or increased swelling, although she has lymphedema.   Her social history includes recent stress due to managing an inheritance and making financial decisions independently. She has been dealing with the stress of these responsibilities without  a partner, although her sister provides some support from [place] of state.    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   Obesity (BMI 30.0-34.9) 12/23/2020   Thoracic aorta atherosclerosis (HCC) 12/23/2020   Subclavian vein occlusion, 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   Paroxysmal atrial fibrillation (HCC) 10/02/2019   Diverticulosis of colon 08/08/2018   Cholelithiasis 08/08/2018   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   Chronic acquired lymphedema 01/08/2017   Left ventricular hypertrophy 11/27/2014   Benign essential HTN 08/21/2014   Dyslipidemia 08/21/2014   Cardiac dysrhythmia 08/21/2014   Dysmetabolic syndrome 08/21/2014   Female genuine stress incontinence 08/21/2014   History of shingles 08/21/2014   Personal history of skin cancer 11/24/2009    Past Surgical History:  Procedure Laterality Date   AORTIC VALVE REPLACEMENT Bilateral 02/27/2017   AORTIC VALVE REPLACEMENT  12/15/2021   CARDIAC CATHETERIZATION     CARDIAC VALVE REPLACEMENT  02/27/2017   12/15/21   CARDIOVERSION  10/2019   cataract surgery Bilateral 05/24/2014   second eye 06/07/2014   COLONOSCOPY WITH PROPOFOL  N/A 09/24/2019   Procedure: COLONOSCOPY WITH PROPOFOL ;  Surgeon: Irby Mannan, MD;  Location: [facility] ENDOSCOPY;  Service:  Gastroenterology;  Laterality: N/A;   COLONOSCOPY WITH PROPOFOL  N/A 12/10/2019   Procedure: COLONOSCOPY WITH PROPOFOL ;  Surgeon: Irby Mannan, MD;  Location: [facility] ENDOSCOPY;   Service: Endoscopy;  Laterality: N/A;   COLONOSCOPY WITH PROPOFOL  N/A 01/03/2023   Procedure: COLONOSCOPY WITH PROPOFOL ;  Surgeon: Selena Daily, MD;  Location: [facility] ENDOSCOPY;  Service: Gastroenterology;  Laterality: N/A;   CORONARY ARTERY BYPASS GRAFT     EYE SURGERY     MELANOMA EXCISION     OVARIAN CYST REMOVAL     EXPLORATORY LAPAROTOMY   POLYPECTOMY  01/03/2023   Procedure: POLYPECTOMY INTESTINAL;  Surgeon: Selena Daily, MD;  Location: [facility] ENDOSCOPY;  Service: Gastroenterology;;   TONSILLECTOMY      Family History  Problem Relation Age of Onset   Stroke Mother    Thyroid  nodules Mother    Lung cancer Mother    Cancer Mother    Hypertension Mother    Diabetes Sister    Hypertension Sister    Aortic aneurysm Father    Early death Father    Heart disease Father    Obesity Father    Stroke Father    Breast cancer Maternal Aunt    Bone cancer Maternal Uncle    Depression Daughter    Anxiety disorder Daughter    Depression Daughter     Social History   Tobacco Use   Smoking status: Never   Smokeless tobacco: Never  Substance Use Topics   Alcohol use: Not Currently     Current Outpatient Medications:    amiodarone  (PACERONE ) 200 MG tablet, Take 1 tablet (200 mg total) by mouth daily., Disp: 30 tablet, Rfl: 0   apixaban (ELIQUIS) 5 MG TABS tablet, Take 5 mg by mouth 2 (two) times daily., Disp: , Rfl:    Ascorbic Acid (VITAMIN C) 1000 MG tablet, Take 1,000 mg by mouth in the morning and at bedtime., Disp: , Rfl:    Coenzyme Q10 100 MG TABS, Take 100 mg by mouth daily., Disp: , Rfl:    furosemide (LASIX) 20 MG tablet, Take 20 mg by mouth daily., Disp: , Rfl:    JARDIANCE 10 MG TABS tablet, Take 10 mg by mouth daily., Disp: , Rfl:    Magnesium 500 MG CAPS, Take 800 mg by mouth in the morning and at bedtime., Disp: , Rfl:    metoprolol  succinate (TOPROL -XL) 50 MG 24 hr tablet, Take 50 mg by mouth in the morning and at bedtime., Disp: , Rfl:    Omega-3  Fatty Acids (OMEGA 3 PO), Take 345 capsules by mouth in the morning and at bedtime. Natural NORDIC, Disp: , Rfl:    Vitamin D -Vitamin K (VITAMIN K2-VITAMIN D3 PO), Take 2 capsules by mouth daily. 5000 units Vitamin D3 + 90 mcg of Vitamin K2, Disp: , Rfl:    vitamin E 200 UNIT capsule, Take 200 Units by mouth 2 (two) times daily., Disp: , Rfl:    zinc gluconate 50 MG tablet, Take 50 mg by mouth daily., Disp: , Rfl:    atorvastatin  (LIPITOR) 40 MG tablet, Take 1 tablet (40 mg total) by mouth 2 (two) times a week., Disp: 24 tablet, Rfl: 1  Allergies  Allergen Reactions   Protamine Other (See Comments)    Severe hypotension refractory to pressor and volume administration, only reversed by administration of epinephrine.   Ace Inhibitors Other (See Comments)    UNKNOWN REACTION   Duloxetine  Other (See Comments)    Drowsiness    I personally  reviewed active problem list, medication list, allergies, family history with the patient/caregiver today.   ROS  Ten systems reviewed and is negative except as mentioned in HPI    Objective Physical Exam Constitutional: Patient appears well-developed and well-nourished. Obese  No distress.  HEENT: head atraumatic, normocephalic, pupils equal and reactive to light, neck supple Cardiovascular: Normal rate, regular rhythm and normal heart sounds.  No murmur heard. Non pitting  BLE edema. She has lymphedema Pulmonary/Chest: Effort normal and breath sounds normal. No respiratory distress. Abdominal: Soft.  There is no tenderness. Muscular skeletal: using a cane Psychiatric: Patient has a normal mood and affect. behavior is normal. Judgment and thought content normal.     Vitals:   07/18/23 1057 07/18/23 1135  BP: (!) 148/90 138/82  Pulse: 66   Resp: 16   SpO2: 93%   Weight: 258 lb 11.2 oz (117.3 kg)   Height: 5' 8.5" (1.74 m)     Body mass index is 38.76 kg/m.    PHQ2/9:    07/18/2023   10:50 AM 02/20/2023   10:42 AM 01/07/2023    1:44 PM  10/11/2022   11:44 AM 10/01/2022    9:26 AM  Depression screen PHQ 2/9  Decreased Interest 0 0 1 0 0  Down, Depressed, Hopeless 0 0 1 0 1  PHQ - 2 Score 0 0 2 0 1  Altered sleeping 0 0 0  0  Tired, decreased energy 0 0 1  0  Change in appetite 0 0 0  0  Feeling bad or failure about yourself  0 0 0  0  Trouble concentrating 0 0 0  0  Moving slowly or fidgety/restless 0 0 0  0  Suicidal thoughts 0 0 0  0  PHQ-9 Score 0 0 3  1  Difficult doing work/chores Not difficult at all Not difficult at all Somewhat difficult      phq 9 is negative  Fall Risk:    07/18/2023   10:46 AM 02/20/2023   10:38 AM 01/07/2023    1:44 PM 10/11/2022   11:38 AM 10/01/2022    9:26 AM  Fall Risk   Falls in the past year? 0 0 0 0 0  Number falls in past yr: 0 0  0 0  Injury with Fall? 0 0  0 0  Risk for fall due to : No Fall Risks No Fall Risks Impaired balance/gait No Fall Risks No Fall Risks  Follow up Falls prevention discussed;Education provided;Falls evaluation completed Falls prevention discussed;Education provided;Falls evaluation completed Falls prevention discussed;Education provided;Falls evaluation completed Education provided;Falls prevention discussed Falls prevention discussed      Assessment & Plan Left Hip Pain Chronic pain radiating to groin and buttock, exacerbated by ambulation. Differential includes trochanteric bursitis, sciatica, or hip arthritis. Previous imaging suggested scoliosis. Pain affects mobility and may contribute to hypertension. - Refer to orthopedic specialist for evaluation and hip x-ray. - Discontinue Celebrex . - Continue acetaminophen  650 mg, up to 4 times daily, not exceeding 3 grams per day.  Hypertension Blood pressure 148/90 mmHg, elevated. Stress and chronic pain may contribute. Managed with metoprolol , furosemide, and Jardiance. - Continue metoprolol  50 mg twice daily. - Continue furosemide and Jardiance as prescribed. - Recheck blood pressure before leaving  the clinic. - Schedule follow-up blood pressure check in 2 weeks.  Congestive Heart Failure Asymptomatic, managed with metoprolol , furosemide, and Jardiance. Regular cardiology follow-up shows well-managed condition. - Continue metoprolol , furosemide, and Jardiance.  Paroxysmal Atrial Fibrillation Managed with amiodarone   and Eliquis. No current symptoms. Eliquis continued for thrombus prevention due to subclavian vein occlusion and atrial fibrillation. - Continue amiodarone  and Eliquis as prescribed.  Dyslipidemia Managed with atorvastatin , taken twice weekly. Recent lipid panel shows improved LDL but borderline triglycerides. Muscle pain with more frequent dosing. - Continue atorvastatin  twice weekly. - Encourage dietary modifications.  Prediabetes A1c of 5.7% indicates prediabetes. Advised to reduce carbohydrate intake to manage blood glucose levels. - Encourage dietary modifications to reduce carbohydrate intake.  Lymphedema Chronic lymphedema, potentially exacerbated by decreased mobility due to hip pain. - Advise maintaining mobility to manage symptoms.  General Health Maintenance Routine health maintenance includes mammogram and bone density screening. Advised against unnecessary supplements such as liver cleanse, vitamin E, and omega-3. - Schedule mammogram. - Schedule bone density scan. - Discontinue vitamin E and omega-3 supplements. - Continue vitamin D  and zinc as needed.  Follow-up Follow-up for blood pressure management and specialist referrals. - Schedule follow-up appointment in 6 months, or sooner if blood pressure remains elevated. - Follow-up with orthopedic specialist as referred.

## 2023-08-01 DIAGNOSIS — I890 Lymphedema, not elsewhere classified: Secondary | ICD-10-CM | POA: Diagnosis not present

## 2023-08-01 DIAGNOSIS — M1612 Unilateral primary osteoarthritis, left hip: Secondary | ICD-10-CM | POA: Diagnosis not present

## 2023-08-01 DIAGNOSIS — M87352 Other secondary osteonecrosis, left femur: Secondary | ICD-10-CM | POA: Diagnosis not present

## 2023-08-04 ENCOUNTER — Other Ambulatory Visit: Payer: Self-pay | Admitting: Family Medicine

## 2023-08-04 DIAGNOSIS — F325 Major depressive disorder, single episode, in full remission: Secondary | ICD-10-CM

## 2023-08-21 DIAGNOSIS — M25552 Pain in left hip: Secondary | ICD-10-CM | POA: Diagnosis not present

## 2023-09-02 DIAGNOSIS — Z8249 Family history of ischemic heart disease and other diseases of the circulatory system: Secondary | ICD-10-CM | POA: Diagnosis not present

## 2023-09-02 DIAGNOSIS — I70223 Atherosclerosis of native arteries of extremities with rest pain, bilateral legs: Secondary | ICD-10-CM | POA: Diagnosis not present

## 2023-09-02 DIAGNOSIS — I4891 Unspecified atrial fibrillation: Secondary | ICD-10-CM | POA: Diagnosis not present

## 2023-09-02 DIAGNOSIS — R32 Unspecified urinary incontinence: Secondary | ICD-10-CM | POA: Diagnosis not present

## 2023-09-02 DIAGNOSIS — E785 Hyperlipidemia, unspecified: Secondary | ICD-10-CM | POA: Diagnosis not present

## 2023-09-02 DIAGNOSIS — M1990 Unspecified osteoarthritis, unspecified site: Secondary | ICD-10-CM | POA: Diagnosis not present

## 2023-09-02 DIAGNOSIS — Z008 Encounter for other general examination: Secondary | ICD-10-CM | POA: Diagnosis not present

## 2023-09-02 DIAGNOSIS — D6869 Other thrombophilia: Secondary | ICD-10-CM | POA: Diagnosis not present

## 2023-09-02 DIAGNOSIS — I77819 Aortic ectasia, unspecified site: Secondary | ICD-10-CM | POA: Diagnosis not present

## 2023-09-02 DIAGNOSIS — I10 Essential (primary) hypertension: Secondary | ICD-10-CM | POA: Diagnosis not present

## 2023-09-02 DIAGNOSIS — E1151 Type 2 diabetes mellitus with diabetic peripheral angiopathy without gangrene: Secondary | ICD-10-CM | POA: Diagnosis not present

## 2023-09-02 DIAGNOSIS — Z833 Family history of diabetes mellitus: Secondary | ICD-10-CM | POA: Diagnosis not present

## 2023-09-02 LAB — HEMOGLOBIN A1C: Hemoglobin A1C: 5.7

## 2023-09-02 LAB — HM DIABETES EYE EXAM

## 2023-09-06 DIAGNOSIS — M87352 Other secondary osteonecrosis, left femur: Secondary | ICD-10-CM | POA: Diagnosis not present

## 2023-09-06 DIAGNOSIS — M1612 Unilateral primary osteoarthritis, left hip: Secondary | ICD-10-CM | POA: Diagnosis not present

## 2023-09-06 DIAGNOSIS — I890 Lymphedema, not elsewhere classified: Secondary | ICD-10-CM | POA: Diagnosis not present

## 2023-09-11 ENCOUNTER — Telehealth: Payer: Self-pay

## 2023-09-11 NOTE — Telephone Encounter (Signed)
 done

## 2023-09-11 NOTE — Telephone Encounter (Signed)
 Copied from CRM [id number]. Topic: General - Other >> Sep 11, 2023 12:44 PM Winona SAUNDERS wrote: Lauraine from [facility] calling to provide information on home visit. Pt had her visit July 21st, clinician administered a PAD test and her result came back abnormal, measurement for right leg was 1.08 and the left leg was 0.7. Wanted to let PCP know so she can be scheduled for a follow up, they will also mail this information in within the next 7- 10 business days. Call back [phone] secure line *8am- 5pm Central M-F

## 2023-10-17 ENCOUNTER — Ambulatory Visit: Payer: Medicare HMO

## 2023-10-17 DIAGNOSIS — Z0000 Encounter for general adult medical examination without abnormal findings: Secondary | ICD-10-CM | POA: Diagnosis not present

## 2023-10-17 NOTE — Progress Notes (Signed)
 Subjective:   Melody Soto is a 73 y.o. who presents for a Medicare Wellness preventive visit.  As a reminder, Annual Wellness Visits don't include a physical exam, and some assessments may be limited, especially if this visit is performed virtually. We may recommend an in-person follow-up visit with your provider if needed.  Visit Complete: Virtual I connected with  Melody Soto on 10/17/23 by a audio enabled telemedicine application and verified that I am speaking with the correct person using two identifiers.  Patient Location: Home  Provider Location: Home Office  I discussed the limitations of evaluation and management by telemedicine. The patient expressed understanding and agreed to proceed.  Vital Signs: Because this visit was a virtual/telehealth visit, some criteria may be missing or patient reported. Any vitals not documented were not able to be obtained and vitals that have been documented are patient reported.  VideoDeclined- This patient declined Librarian, academic. Therefore the visit was completed with audio only.  Persons Participating in Visit: Patient.  AWV Questionnaire: No: Patient Medicare AWV questionnaire was not completed prior to this visit.  Cardiac Risk Factors include: advanced age (>59men, >21 women);dyslipidemia;hypertension;obesity (BMI >30kg/m2);sedentary lifestyle     Objective:    Today's Vitals   10/17/23 1132  PainSc: 4    There is no height or weight on file to calculate BMI.     10/17/2023   11:39 AM 01/03/2023    7:03 AM 10/11/2022   11:51 AM 08/29/2022   10:04 AM 02/28/2022    9:07 AM 12/03/2021    5:18 PM 08/25/2020   10:23 AM  Advanced Directives  Does Patient Have a Medical Advance Directive? No No No No No No No  Would patient like information on creating a medical advance directive? No - Patient declined No - Patient declined  No - Patient declined No - Patient declined No - Patient declined  No - Patient declined    Current Medications (verified) Outpatient Encounter Medications as of 10/17/2023  Medication Sig   amiodarone  (PACERONE ) 200 MG tablet Take 1 tablet (200 mg total) by mouth daily.   apixaban (ELIQUIS) 5 MG TABS tablet Take 5 mg by mouth 2 (two) times daily.   Ascorbic Acid (VITAMIN C) 1000 MG tablet Take 1,000 mg by mouth in the morning and at bedtime.   atorvastatin  (LIPITOR) 40 MG tablet Take 1 tablet (40 mg total) by mouth 2 (two) times a week.   Coenzyme Q10 100 MG TABS Take 100 mg by mouth daily.   furosemide (LASIX) 20 MG tablet Take 20 mg by mouth daily.   JARDIANCE 10 MG TABS tablet Take 10 mg by mouth daily.   Magnesium 500 MG CAPS Take 800 mg by mouth in the morning and at bedtime.   metoprolol  succinate (TOPROL -XL) 50 MG 24 hr tablet Take 50 mg by mouth in the morning and at bedtime.   Omega-3 Fatty Acids (OMEGA 3 PO) Take 345 capsules by mouth in the morning and at bedtime. Natural NORDIC   Vitamin D -Vitamin K (VITAMIN K2-VITAMIN D3 PO) Take 2 capsules by mouth daily. 5000 units Vitamin D3 + 90 mcg of Vitamin K2   vitamin E 200 UNIT capsule Take 200 Units by mouth 2 (two) times daily.   [DISCONTINUED] zinc gluconate 50 MG tablet Take 50 mg by mouth daily.   No facility-administered encounter medications on file as of 10/17/2023.    Allergies (verified) Ace inhibitors, Protamine, and Duloxetine    History: Past Medical  History:  Diagnosis Date   Actinic keratosis    Aortic stenosis    Blood transfusion without reported diagnosis Nov. 2023   While hospitalized @ [facility].   Cataract    CHF (congestive heart failure) (HCC)    Depression    Dysrhythmia    Endocarditis of prosthetic valve    Endometrial cancer determined by uterine biopsy (HCC) 10/29/2016   Rad tx's and internal brachytherapy.   Heart murmur    HX OF   History of cardioversion    Hyperlipidemia    Hypertension    CONTROLLED ON MEDS   Lymphedema    Melanoma (HCC)    Left upper  arm. 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   AORTIC VALVE REPLACEMENT  12/15/2021   CARDIAC CATHETERIZATION     CARDIAC VALVE REPLACEMENT  02/27/2017   12/15/21   CARDIOVERSION  10/2019   cataract surgery Bilateral 05/24/2014   second eye 06/07/2014   COLONOSCOPY WITH PROPOFOL  N/A 09/24/2019   Procedure: COLONOSCOPY WITH PROPOFOL ;  Surgeon: Janalyn Keene NOVAK, MD;  Location: [facility] ENDOSCOPY;  Service: Gastroenterology;  Laterality: N/A;   COLONOSCOPY WITH PROPOFOL  N/A 12/10/2019   Procedure: COLONOSCOPY WITH PROPOFOL ;  Surgeon: Janalyn Keene NOVAK, MD;  Location: [facility] ENDOSCOPY;  Service: Endoscopy;  Laterality: N/A;   COLONOSCOPY WITH PROPOFOL  N/A 01/03/2023   Procedure: COLONOSCOPY WITH PROPOFOL ;  Surgeon: Unk Corinn Skiff, MD;  Location: [facility] ENDOSCOPY;  Service: Gastroenterology;  Laterality: N/A;   CORONARY ARTERY BYPASS GRAFT     EYE SURGERY     MELANOMA EXCISION     OVARIAN CYST REMOVAL     EXPLORATORY LAPAROTOMY   POLYPECTOMY  01/03/2023   Procedure: POLYPECTOMY INTESTINAL;  Surgeon: Unk Corinn Skiff, MD;  Location: [facility] ENDOSCOPY;  Service: Gastroenterology;;   TONSILLECTOMY     Family History  Problem Relation Age of Onset   Stroke Mother    Thyroid  nodules Mother    Lung cancer Mother    Cancer Mother    Hypertension Mother    Diabetes Sister    Hypertension Sister    Aortic aneurysm Father    Early death Father    Heart disease Father    Obesity Father    Stroke Father    Breast cancer Maternal Aunt    Bone cancer Maternal Uncle    Depression Daughter    Anxiety disorder Daughter    Depression Daughter    Social History   Socioeconomic History   Marital status: Divorced    Spouse name: Not on file   Number of children: 2   Years of education: Not on file   Highest education level: Bachelor's degree (e.g., BA,  AB, BS)  Occupational History   Occupation: caregiver    Employer: [employer]    Comment: caregiver  Tobacco Use   Smoking status: Never   Smokeless tobacco: Never  Vaping Use   Vaping status: Never Used  Substance and Sexual Activity   Alcohol use: Not Currently   Drug use: No   Sexual activity: Not Currently    Birth control/protection: Abstinence, Post-menopausal  Other Topics Concern   Not on file  Social History Narrative   She is divorced   She has two daughters   The youngest is not currently talking to her since 07/2018. She got pregnant without being married and had the  baby in October.    Social Drivers of Corporate investment banker Strain: Low Risk  (10/17/2023)   Overall Financial Resource Strain (CARDIA)    Difficulty of Paying Living Expenses: Not hard at all  Food Insecurity: No Food Insecurity (10/17/2023)   Hunger Vital Sign    Worried About Running Out of Food in the Last Year: Never true    Ran Out of Food in the Last Year: Never true  Transportation Needs: No Transportation Needs (10/17/2023)   PRAPARE - Administrator, Civil Service (Medical): No    Lack of Transportation (Non-Medical): No  Physical Activity: Insufficiently Active (10/17/2023)   Exercise Vital Sign    Days of Exercise per Week: 3 days    Minutes of Exercise per Session: 10 min  Stress: No Stress Concern Present (10/17/2023)   [employer] of Occupational Health - Occupational Stress Questionnaire    Feeling of Stress: Not at all  Social Connections: Socially Isolated (10/17/2023)   Social Connection and Isolation Panel    Frequency of Communication with Friends and Family: More than three times a week    Frequency of Social Gatherings with Friends and Family: Once a week    Attends Religious Services: Never    Database administrator or Organizations: No    Attends Engineer, structural: Never    Marital Status: Divorced    Tobacco  Counseling Counseling given: Not Answered    Clinical Intake:  Pre-visit preparation completed: Yes  Pain : 0-10 Pain Score: 4  Pain Type: Chronic pain Pain Location: Hip Pain Orientation: Left Pain Descriptors / Indicators: Aching, Constant Pain Onset: More than a month ago Pain Frequency: Constant Pain Relieving Factors: SITTING OR LAYING DOWN ON OTHER SIDE  Pain Relieving Factors: SITTING OR LAYING DOWN ON OTHER SIDE  BMI - recorded: 38.7 Nutritional Status: BMI > 30  Obese Nutritional Risks: None Diabetes: No  Lab Results  Component Value Date   HGBA1C 5.7 09/02/2023   HGBA1C 5.7 (H) 01/07/2023   HGBA1C 5.5 07/28/2021     How often do you need to have someone help you when you read instructions, pamphlets, or other written materials from your doctor or pharmacy?: 1 - Never  Interpreter Needed?: No  Information entered by :: JHONNIE DAS, LPN   Activities of Daily Living    10/17/2023   11:41 AM 10/10/2023    1:48 PM  In your present state of health, do you have any difficulty performing the following activities:  Hearing? 0 0  Vision? 0 0  Difficulty concentrating or making decisions? 0 0  Walking or climbing stairs? 1 0  Comment HIP PAIN   Dressing or bathing? 0 0  Doing errands, shopping? 0 0  Preparing Food and eating ? N N  Using the Toilet? N N  In the past six months, have you accidently leaked urine? Y Y  Do you have problems with loss of bowel control? N N  Managing your Medications? N N  Managing your Finances? N N  Housekeeping or managing your Housekeeping? CINDERELLA CINDERELLA    Patient Care Team: Sowles, Krichna, MD as PCP - General (Family Medicine) Leonce Garnette BIRCH, MD as Attending Physician (Obstetrics and Gynecology) Lenn Aran, MD as Referring Physician (Radiation Oncology) Florencio Cara BIRCH, MD as Consulting Physician (Cardiology) Cesario Barter, MD as Referring Physician (Internal Medicine) Estelle Fallow (Optometry)  I have  updated your Care Teams any recent Medical Services you may have received  from other providers in the past year.     Assessment:   This is a routine wellness examination for Melody Soto.  Hearing/Vision screen Hearing Screening - Comments:: NO AIDS Vision Screening - Comments:: [facility]- DR.RICHARDSON   Goals Addressed             This Visit's Progress    DIET - EAT MORE FRUITS AND VEGETABLES         Depression Screen     10/17/2023   11:37 AM 07/18/2023   10:50 AM 02/20/2023   10:42 AM 01/07/2023    1:44 PM 10/11/2022   11:44 AM 10/01/2022    9:26 AM 05/15/2022   10:48 AM  PHQ 2/9 Scores  PHQ - 2 Score 0 0 0 2 0 1 0  PHQ- 9 Score 0 0 0 3  1 0    Fall Risk     10/17/2023   11:40 AM 10/10/2023    1:48 PM 07/18/2023   10:46 AM 02/20/2023   10:38 AM 01/07/2023    1:44 PM  Fall Risk   Falls in the past year? 1 0 0 0 0  Number falls in past yr: 0 0 0 0   Injury with Fall? 0 1 0 0   Risk for fall due to :   No Fall Risks No Fall Risks Impaired balance/gait  Follow up Falls evaluation completed;Falls prevention discussed  Falls prevention discussed;Education provided;Falls evaluation completed Falls prevention discussed;Education provided;Falls evaluation completed Falls prevention discussed;Education provided;Falls evaluation completed    MEDICARE RISK AT HOME:  Medicare Risk at Home Any stairs in or around the home?: Yes If so, are there any without handrails?: No Home free of loose throw rugs in walkways, pet beds, electrical cords, etc?: Yes Adequate lighting in your home to reduce risk of falls?: Yes Life alert?: No Use of a cane, walker or w/c?: Yes (CANE SOMETIMES) Grab bars in the bathroom?: Yes Shower chair or bench in shower?: Yes Elevated toilet seat or a handicapped toilet?: Yes  TIMED UP AND GO:  Was the test performed?  No  Cognitive Function: 6CIT completed        10/17/2023   11:42 AM 10/11/2022   11:53 AM 08/05/2018    2:23 PM  6CIT Screen  What Year? 0  points 0 points 0 points  What month? 0 points 0 points 0 points  What time? 0 points 0 points 0 points  Count back from 20 0 points 0 points 0 points  Months in reverse 0 points 0 points 0 points  Repeat phrase 0 points 0 points 0 points  Total Score 0 points 0 points 0 points    Immunizations Immunization History  Administered Date(s) Administered   H1N1 02/25/2017   INFLUENZA, HIGH DOSE SEASONAL PF 01/29/2018   Influenza,inj,Quad PF,6+ Mos 11/24/2015, 02/23/2022   Influenza-Unspecified 11/13/2010   Pneumococcal Conjugate-13 08/08/2018   Pneumococcal Polysaccharide-23 03/21/2017   Tdap 07/14/2013   Zoster, Live 11/24/2015    Screening Tests Health Maintenance  Topic Date Due   Zoster Vaccines- Shingrix (1 of 2) 11/11/1970   MAMMOGRAM  04/07/2023   DTaP/Tdap/Td (2 - Td or Tdap) 07/15/2023   INFLUENZA VACCINE  09/13/2023   Medicare Annual Wellness (AWV)  10/16/2024   Colonoscopy  01/03/2028   Pneumococcal Vaccine: 50+ Years  Completed   DEXA SCAN  Completed   Hepatitis C Screening  Completed   HPV VACCINES  Aged Out   Meningococcal B Vaccine  Aged Out   COVID-19  Vaccine  Discontinued   Fecal DNA (Cologuard)  Discontinued    Health Maintenance  Health Maintenance Due  Topic Date Due   Zoster Vaccines- Shingrix (1 of 2) 11/11/1970   MAMMOGRAM  04/07/2023   DTaP/Tdap/Td (2 - Td or Tdap) 07/15/2023   INFLUENZA VACCINE  09/13/2023   Health Maintenance Items Addressed: DECLINES COVID & FLU SHOTS; UP TO DATE ON OTHER SHOTS EXCEPT SHINGRIX; HAS ORDER FOR MAMMOGRAM & BDS; UP TO DATE ON COLONOSCOPY  Additional Screening:  Vision Screening: Recommended annual ophthalmology exams for early detection of glaucoma and other disorders of the eye. Would you like a referral to an eye doctor? No    Dental Screening: Recommended annual dental exams for proper oral hygiene  Community Resource Referral / Chronic Care Management: CRR required this visit?  No   CCM required this  visit?  No   Plan:    I have personally reviewed and noted the following in the patient's chart:   Medical and social history Use of alcohol, tobacco or illicit drugs  Current medications and supplements including opioid prescriptions. Patient is not currently taking opioid prescriptions. Functional ability and status Nutritional status Physical activity Advanced directives List of other physicians Hospitalizations, surgeries, and ER visits in previous 12 months Vitals Screenings to include cognitive, depression, and falls Referrals and appointments  In addition, I have reviewed and discussed with patient certain preventive protocols, quality metrics, and best practice recommendations. A written personalized care plan for preventive services as well as general preventive health recommendations were provided to patient.   Jhonnie GORMAN Das, LPN   [id number]   After Visit Summary: (MyChart) Due to this being a telephonic visit, the after visit summary with patients personalized plan was offered to patient via MyChart   Notes: Nothing significant to report at this time.

## 2023-10-17 NOTE — Patient Instructions (Signed)
 Ms. Rattan , Thank you for taking time out of your busy schedule to complete your Annual Wellness Visit with me. I enjoyed our conversation and look forward to speaking with you again next year. I, as well as your care team,  appreciate your ongoing commitment to your health goals. Please review the following plan we discussed and let me know if I can assist you in the future.  Follow up Visits: 10/22/24 @ 11:30 am by phone We will see or speak with you next year for your Next Medicare AWV with our clinical staff Have you seen your provider in the last 6 months (3 months if uncontrolled diabetes)? Yes  Clinician Recommendations:  Aim for 30 minutes of exercise or brisk walking, 6-8 glasses of water, and 5 servings of fruits and vegetables each day. Take care!      This is a list of the screenings recommended for you:  Health Maintenance  Topic Date Due   Zoster (Shingles) Vaccine (1 of 2) 11/11/1970   Mammogram  04/07/2023   DTaP/Tdap/Td vaccine (2 - Td or Tdap) 07/15/2023   Flu Shot  09/13/2023   Medicare Annual Wellness Visit  10/16/2024   Colon Cancer Screening  01/03/2028   Pneumococcal Vaccine for age over 36  Completed   DEXA scan (bone density measurement)  Completed   Hepatitis C Screening  Completed   HPV Vaccine  Aged Out   Meningitis B Vaccine  Aged Out   COVID-19 Vaccine  Discontinued   Cologuard (Stool DNA test)  Discontinued    Advanced directives: (ACP Link)Information on Advanced Care Planning can be found at [place]  Secretary of [facility] Advance Health Care Directives Advance Health Care Directives. [email or web address]  Advance Care Planning is important because it:  [x]  Makes sure you receive the medical care that is consistent with your values, goals, and preferences  [x]  It provides guidance to your family and loved ones and reduces their decisional burden about whether or not they are making the right decisions based on your wishes.  Follow the link provided in your  after visit summary or read over the paperwork we have mailed to you to help you started getting your Advance Directives in place. If you need assistance in completing these, please reach out to us  so that we can help you!

## 2023-10-29 ENCOUNTER — Encounter: Payer: Self-pay | Admitting: Family Medicine

## 2023-10-31 DIAGNOSIS — E782 Mixed hyperlipidemia: Secondary | ICD-10-CM | POA: Diagnosis not present

## 2023-10-31 DIAGNOSIS — Z79899 Other long term (current) drug therapy: Secondary | ICD-10-CM | POA: Diagnosis not present

## 2023-10-31 DIAGNOSIS — Z9289 Personal history of other medical treatment: Secondary | ICD-10-CM | POA: Diagnosis not present

## 2023-10-31 DIAGNOSIS — Z8679 Personal history of other diseases of the circulatory system: Secondary | ICD-10-CM | POA: Diagnosis not present

## 2023-10-31 DIAGNOSIS — E66811 Obesity, class 1: Secondary | ICD-10-CM | POA: Diagnosis not present

## 2023-10-31 DIAGNOSIS — I10 Essential (primary) hypertension: Secondary | ICD-10-CM | POA: Diagnosis not present

## 2023-10-31 DIAGNOSIS — I480 Paroxysmal atrial fibrillation: Secondary | ICD-10-CM | POA: Diagnosis not present

## 2023-10-31 DIAGNOSIS — Z952 Presence of prosthetic heart valve: Secondary | ICD-10-CM | POA: Diagnosis not present

## 2023-10-31 DIAGNOSIS — I890 Lymphedema, not elsewhere classified: Secondary | ICD-10-CM | POA: Diagnosis not present

## 2023-11-04 ENCOUNTER — Ambulatory Visit: Payer: Self-pay

## 2023-11-04 NOTE — Telephone Encounter (Signed)
 Copied from CRM #[id number]. Topic: Clinical - Red Word Triage >> Nov 04, 2023  9:51 AM Delon HERO wrote: Red Word that prompted transfer to Nurse Triage: Patients daughter is calling to report that paitient is calling to report lymphoedema in left & right legs and leg soreness. And the patient does wear compressions sock. Also there left  hip pain. And not very mobile.

## 2023-11-04 NOTE — Telephone Encounter (Signed)
 Reason for Disposition . [1] MODERATE leg swelling (e.g., swelling extends up to knees) AND [2] new-onset or getting worse  Answer Assessment - Initial Assessment Questions Daughter Damien  called with Chronic leg swelling. I wont be at appt. Can provider address  all my concerns? Pt has lymphedema. Pt has gained weight. Pt has been seeing specialist for hip pain.Orthopedist told pt she needs hip replacement. Appt is 11/26/23 for next steps.  Needs to referral to new lymphedema specialist. Pt has been struggling with depression. Looking for referral for depression.      1. ONSET: When did the swelling start? (e.g., minutes, hours, days)     Not sure 2. LOCATION: What part of the leg is swollen?  Are both legs swollen or just one leg?     Both legs 3. SEVERITY: How bad is the swelling? (e.g., localized; mild, moderate, severe)     Feet to lower legs 4. REDNESS: Is there redness or signs of infection?     no 5. PAIN: Is the swelling painful to touch? If Yes, ask: How painful is it?   (Scale 1-10; mild, moderate or severe)     mild 6. FEVER: Do you have a fever? If Yes, ask: What is it, how was it measured, and when did it start?      denies 7. CAUSE: What do you think is causing the leg swelling?     Lymphedema  8. MEDICAL HISTORY: Do you have a history of blood clots (e.g., DVT), cancer, heart failure, kidney disease, or liver failure?     no 9. RECURRENT SYMPTOM: Have you had leg swelling before? If Yes, ask: When was the last time? What happened that time?     yes 10. OTHER SYMPTOMS: Do you have any other symptoms? (e.g., chest pain, difficulty breathing)       denies  Protocols used: Leg Swelling and Edema-A-AH

## 2023-11-05 ENCOUNTER — Encounter: Payer: Self-pay | Admitting: Family Medicine

## 2023-11-05 ENCOUNTER — Ambulatory Visit (INDEPENDENT_AMBULATORY_CARE_PROVIDER_SITE_OTHER): Admitting: Family Medicine

## 2023-11-05 VITALS — BP 138/84 | HR 63 | Resp 16 | Ht 68.5 in | Wt 264.6 lb

## 2023-11-05 DIAGNOSIS — I890 Lymphedema, not elsewhere classified: Secondary | ICD-10-CM | POA: Diagnosis not present

## 2023-11-05 DIAGNOSIS — M1612 Unilateral primary osteoarthritis, left hip: Secondary | ICD-10-CM | POA: Diagnosis not present

## 2023-11-05 NOTE — Progress Notes (Signed)
 Name: Melody Soto   MRN: [id number]    DOB: [birth year]   Date:11/05/2023       Progress Note  Subjective  Chief Complaint  Chief Complaint  Patient presents with   Leg Swelling    Pt has lymphedema. Pt has gained weight. Pt has been seeing specialist for hip pain.Orthopedist told pt she needs hip replacement. Appt is 11/26/23 for next steps.  Needs to referral to new lymphedema specialist    Discussed the use of AI scribe software for clinical note transcription with the patient, who gave verbal consent to proceed.  History of Present Illness Melody Soto is a 73 year old female with lymphedema who presents for a referral to occupational therapy for massage therapy.  She seeks a referral for occupational therapy to manage her lymphedema, which was previously treated with massage therapy. She has not been receiving massages recently and needs a referral to resume therapy. Initially treated by an occupational therapist named Verneita at a hospital, she underwent various treatments including wraps. She manages her condition at home using a pump and compression socks, although she does not use the pump consistently. She finds massage therapy very helpful and is looking for a facility that requires less walking due to pain in her leg.  She has arthritis in her hip and is scheduled to see an orthopedic specialist at [facility] in October. The arthritis causes significant pain, impacting her mobility and contributing to her reluctance to walk long distances. She has been doing exercises to manage the pain but finds it challenging due to the severity of the discomfort.  She is concerned about the impact of her hip surgery on her lymphedema, fearing it might worsen her condition. She has gained weight due to decreased activity levels, which she attributes to the pain from her arthritis.  She recently found an old prescription of oxycodone from 2023, which she took and found it provided  significant pain relief and improved her sleep. She is considering pain management options to avoid surgery due to current family stressors.  Her daughter, who lives in [place], is currently in [place]  assisting her ex-husband, who has been diagnosed with vascular dementia. This situation has added stress to her life, as her daughter is her primary support and caregiver. She is concerned about her ability to manage her own health issues while her daughter is occupied with her father's care.  No flu or shingles vaccinations. Patient is not interested    Patient Active Problem List   Diagnosis Date Noted   Polyp of ascending colon 01/03/2023   Mild protein-calorie malnutrition 05/15/2022   Thrombus of aorta (HCC) 05/15/2022   Morbid obesity (HCC) 05/15/2022   Hypercalcemia 07/28/2021   Chronic combined systolic and diastolic congestive heart failure (HCC) 07/28/2021   History of colon polyps 07/28/2021   Elevated hematocrit 07/28/2021   Vitamin D  deficiency 07/28/2021   Hyperglycemia 07/28/2021   Skin lesion 07/28/2021   Mild major depression 12/23/2020   Obesity (BMI 30.0-34.9) 12/23/2020   Thoracic aorta atherosclerosis 12/23/2020   Subclavian vein occlusion, 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   Paroxysmal atrial fibrillation (HCC) 10/02/2019   Diverticulosis of colon 08/08/2018   Cholelithiasis 08/08/2018   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   Chronic acquired lymphedema 01/08/2017   Left ventricular hypertrophy 11/27/2014   Benign essential HTN 08/21/2014   Dyslipidemia 08/21/2014   Cardiac dysrhythmia 08/21/2014   Dysmetabolic syndrome 08/21/2014   Female genuine stress incontinence 08/21/2014   History of  shingles 08/21/2014   Personal history of skin cancer 11/24/2009    Social History   Tobacco Use   Smoking status: Never   Smokeless tobacco: Never  Substance Use Topics   Alcohol use: Not Currently     Current Outpatient Medications:     amiodarone  (PACERONE ) 200 MG tablet, Take 1 tablet (200 mg total) by mouth daily., Disp: 30 tablet, Rfl: 0   apixaban (ELIQUIS) 5 MG TABS tablet, Take 5 mg by mouth 2 (two) times daily., Disp: , Rfl:    Ascorbic Acid (VITAMIN C) 1000 MG tablet, Take 1,000 mg by mouth in the morning and at bedtime., Disp: , Rfl:    atorvastatin  (LIPITOR) 40 MG tablet, Take 1 tablet (40 mg total) by mouth 2 (two) times a week., Disp: 24 tablet, Rfl: 1   Coenzyme Q10 100 MG TABS, Take 100 mg by mouth daily., Disp: , Rfl:    furosemide (LASIX) 20 MG tablet, Take 20 mg by mouth daily., Disp: , Rfl:    JARDIANCE 10 MG TABS tablet, Take 10 mg by mouth daily., Disp: , Rfl:    Magnesium 500 MG CAPS, Take 800 mg by mouth in the morning and at bedtime., Disp: , Rfl:    metoprolol  succinate (TOPROL -XL) 50 MG 24 hr tablet, Take 50 mg by mouth in the morning and at bedtime., Disp: , Rfl:    Omega-3 Fatty Acids (OMEGA 3 PO), Take 345 capsules by mouth in the morning and at bedtime. Natural NORDIC, Disp: , Rfl:    Vitamin D -Vitamin K (VITAMIN K2-VITAMIN D3 PO), Take 2 capsules by mouth daily. 5000 units Vitamin D3 + 90 mcg of Vitamin K2, Disp: , Rfl:    vitamin E 200 UNIT capsule, Take 200 Units by mouth 2 (two) times daily., Disp: , Rfl:   Allergies  Allergen Reactions   Ace Inhibitors Other (See Comments) and Shortness Of Breath    UNKNOWN REACTION   Protamine Other (See Comments)    Severe hypotension refractory to pressor and volume administration, only reversed by administration of epinephrine.   Duloxetine  Other (See Comments)    Drowsiness    ROS  Ten systems reviewed and is negative except as mentioned in HPI    Objective  Vitals:   11/05/23 1316  BP: 138/84  Pulse: 63  Resp: 16  SpO2: 94%  Weight: 264 lb 9.6 oz (120 kg)  Height: 5' 8.5 (1.74 m)    Body mass index is 39.65 kg/m.   Physical Exam CONSTITUTIONAL: Patient appears well-developed and well-nourished.  No distress. HEENT: Head  atraumatic, normocephalic, neck supple. CARDIOVASCULAR: Normal rate, regular rhythm ,  murmur heard. Non pitting  BLE edema. PULMONARY: Effort normal and breath sounds normal. No respiratory distress. ABDOMINAL: There is no tenderness or distention. MUSCULOSKELETAL: Normal gait. Without gross motor or sensory deficit. PSYCHIATRIC: Patient has a normal mood and affect. behavior is normal. Judgment and thought content normal.  Recent Results (from the past 2160 hours)  HM DIABETES EYE EXAM     Status: None   Collection Time: 09/02/23 12:00 AM  Result Value Ref Range   HM Diabetic Eye Exam No Retinopathy No Retinopathy  Hemoglobin A1c     Status: None   Collection Time: 09/02/23 12:00 AM  Result Value Ref Range   Hemoglobin A1C 5.7      Assessment & Plan Osteoarthritis of hip with chronic pain Chronic hip osteoarthritis with significant pain affecting mobility and quality of life. Concerns about surgery due to family  obligations and potential impact on lymphedema. Effective pain management with oxycodone not currently prescribed. - Refer red to orthopedic surgeon at [facility] for evaluation. - Advise contacting previous orthopedic provider for pain management options. - Discuss potential referral to pain clinic for chronic pain management.  Lymphedema Chronic lymphedema requiring management. Mobility issues due to pain exacerbate lymphedema. Concerns about hip surgery's impact on lymphatic system. Prefers therapy with less walking. - Refer to occupational therapy for lymphedema management. - Discuss alternative facilities for therapy with less walking. - Encourage use of valet parking and wheelchair assistance at hospital.

## 2023-11-26 DIAGNOSIS — M25552 Pain in left hip: Secondary | ICD-10-CM | POA: Diagnosis not present

## 2023-11-27 ENCOUNTER — Ambulatory Visit
Admission: RE | Admit: 2023-11-27 | Discharge: 2023-11-27 | Disposition: A | Source: Ambulatory Visit | Attending: Family Medicine | Admitting: Family Medicine

## 2023-11-27 ENCOUNTER — Ambulatory Visit
Admission: RE | Admit: 2023-11-27 | Discharge: 2023-11-27 | Disposition: A | Discharge status: Home / Self Care | Source: Ambulatory Visit | Attending: Family Medicine | Admitting: Family Medicine

## 2023-11-27 DIAGNOSIS — Z1231 Encounter for screening mammogram for malignant neoplasm of breast: Secondary | ICD-10-CM | POA: Insufficient documentation

## 2023-11-27 DIAGNOSIS — Z780 Asymptomatic menopausal state: Secondary | ICD-10-CM | POA: Diagnosis not present

## 2023-11-27 DIAGNOSIS — M85851 Other specified disorders of bone density and structure, right thigh: Secondary | ICD-10-CM | POA: Diagnosis not present

## 2023-11-27 DIAGNOSIS — M810 Age-related osteoporosis without current pathological fracture: Secondary | ICD-10-CM | POA: Diagnosis not present

## 2023-11-28 ENCOUNTER — Ambulatory Visit: Payer: Self-pay | Admitting: Family Medicine

## 2023-11-28 NOTE — Progress Notes (Signed)
 Melody Soto                                          MRN: [id number]   11/28/2023   The [facility] Quality Team Specialist reviewed this patient medical record for the purposes of chart review for care gap closure. The following were reviewed: chart review for care gap closure-kidney health evaluation for diabetes:eGFR  and uACR.    [facility] Quality Team

## 2024-01-17 ENCOUNTER — Encounter: Payer: Self-pay | Admitting: Family Medicine

## 2024-01-17 ENCOUNTER — Ambulatory Visit: Admitting: Family Medicine

## 2024-01-17 VITALS — BP 130/82 | HR 75 | Resp 16 | Ht 68.5 in | Wt 267.5 lb

## 2024-01-17 DIAGNOSIS — F330 Major depressive disorder, recurrent, mild: Secondary | ICD-10-CM

## 2024-01-17 DIAGNOSIS — E785 Hyperlipidemia, unspecified: Secondary | ICD-10-CM

## 2024-01-17 DIAGNOSIS — I82B11 Acute embolism and thrombosis of right subclavian vein: Secondary | ICD-10-CM

## 2024-01-17 DIAGNOSIS — Z79899 Other long term (current) drug therapy: Secondary | ICD-10-CM | POA: Diagnosis not present

## 2024-01-17 DIAGNOSIS — Z6841 Body Mass Index (BMI) 40.0 and over, adult: Secondary | ICD-10-CM

## 2024-01-17 DIAGNOSIS — I5042 Chronic combined systolic (congestive) and diastolic (congestive) heart failure: Secondary | ICD-10-CM | POA: Diagnosis not present

## 2024-01-17 DIAGNOSIS — I890 Lymphedema, not elsewhere classified: Secondary | ICD-10-CM | POA: Diagnosis not present

## 2024-01-17 DIAGNOSIS — I480 Paroxysmal atrial fibrillation: Secondary | ICD-10-CM | POA: Diagnosis not present

## 2024-01-17 DIAGNOSIS — Z952 Presence of prosthetic heart valve: Secondary | ICD-10-CM | POA: Diagnosis not present

## 2024-01-17 DIAGNOSIS — I10 Essential (primary) hypertension: Secondary | ICD-10-CM | POA: Diagnosis not present

## 2024-01-17 MED ORDER — SERTRALINE HCL 50 MG PO TABS: 25.0000 mg | ORAL_TABLET | Freq: Every day | ORAL | 0 refills | Status: AC

## 2024-01-17 MED ORDER — ATORVASTATIN CALCIUM 40 MG PO TABS: 40.0000 mg | ORAL_TABLET | ORAL | 1 refills | Status: AC

## 2024-01-17 NOTE — Progress Notes (Signed)
 Name: Melody Soto   MRN: [id number]    DOB: Nov 25, 1950   Date:01/17/2024       Progress Note  Subjective  Chief Complaint  Chief Complaint  Patient presents with   Medical Management of Chronic Issues   Discussed the use of AI scribe software for clinical note transcription with the patient, who gave verbal consent to proceed.  History of Present Illness Melody Soto is a 73 year old female with lymphedema who presents with worsening hip pain and lymphedema.  She has experienced worsening left hip pain over the past three months. Initially, she consulted an orthopedist who advised against surgery due to her lymphedema, which has worsened since her open heart surgery two years ago. She uses a cane to assist with her gait and experiences significant pain when walking, limiting her activity. She takes Tramadol  50 mg as needed for pain, but finds it only partially effective, similar to Tylenol . She manages her pain with Tylenol  ensuring not to exceed 3 grams per day.  Her lymphedema has become painful, a new symptom for her, and she uses compression pumps occasionally, which provide temporary relief but do not significantly improve her condition.  She experiences stress related to her living situation and future care needs, expressing a desire to maintain her independence and avoid assisted living. She has hired help for house cleaning due to her physical limitations. 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 Her daughter has suggested she seek help for depression. No significant sleep disturbances, changes in appetite, or feelings of worthlessness. No suicidal thoughts. She notes difficulty concentrating on reading, often falling asleep while doing so.  Her past medical history includes a subclavian vein occlusion on the right.  She takes Eliquis 5 mg twice daily for atrial fibrillation plus  Amiodarone  and metoprolol  for rhythm control.  She denies chest pain or palpitation. No bleeding  She also takes metoprolol , Lasix, Jardiance for chronic combined systolic/diastolic CHF  . She feels tired when she walks but thinks related to lymphedema and joint pain. Denies orthopnea.  She has hypercalcemia and is awaiting further evaluation of her parathyroid function.    Patient Active Problem List   Diagnosis Date Noted   Primary osteoarthritis of left hip 11/05/2023   Polyp of ascending colon 01/03/2023   Mild protein-calorie malnutrition 05/15/2022   Thrombus of aorta (HCC) 05/15/2022   Morbid obesity (HCC) 05/15/2022   Hypercalcemia 07/28/2021   Chronic combined systolic and diastolic congestive heart failure (HCC) 07/28/2021   History of colon polyps 07/28/2021   Elevated hematocrit 07/28/2021   Vitamin D  deficiency 07/28/2021   Hyperglycemia 07/28/2021   Skin lesion 07/28/2021   Mild major depression 12/23/2020   Obesity (BMI 30.0-34.9) 12/23/2020   Thoracic aorta atherosclerosis 12/23/2020   Subclavian vein occlusion, 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   Paroxysmal atrial fibrillation (HCC) 10/02/2019   Diverticulosis of colon 08/08/2018   Cholelithiasis 08/08/2018   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   Chronic acquired lymphedema 01/08/2017   Left ventricular hypertrophy 11/27/2014   Benign essential HTN 08/21/2014   Dyslipidemia 08/21/2014   Cardiac dysrhythmia 08/21/2014   Dysmetabolic syndrome 08/21/2014   Female genuine stress incontinence 08/21/2014   History of shingles 08/21/2014   Personal history of skin cancer 11/24/2009    Past Surgical History:  Procedure Laterality Date   AORTIC VALVE REPLACEMENT Bilateral 02/27/2017   AORTIC VALVE REPLACEMENT  12/15/2021  CARDIAC CATHETERIZATION     CARDIAC VALVE REPLACEMENT  02/27/2017   12/15/21   CARDIOVERSION  10/2019   cataract surgery Bilateral 05/24/2014   second eye 06/07/2014    COLONOSCOPY WITH PROPOFOL  N/A 09/24/2019   Procedure: COLONOSCOPY WITH PROPOFOL ;  Surgeon: Janalyn Keene NOVAK, MD;  Location: [facility] ENDOSCOPY;  Service: Gastroenterology;  Laterality: N/A;   COLONOSCOPY WITH PROPOFOL  N/A 12/10/2019   Procedure: COLONOSCOPY WITH PROPOFOL ;  Surgeon: Janalyn Keene NOVAK, MD;  Location: [facility] ENDOSCOPY;  Service: Endoscopy;  Laterality: N/A;   COLONOSCOPY WITH PROPOFOL  N/A 01/03/2023   Procedure: COLONOSCOPY WITH PROPOFOL ;  Surgeon: Unk Corinn Skiff, MD;  Location: [facility] ENDOSCOPY;  Service: Gastroenterology;  Laterality: N/A;   CORONARY ARTERY BYPASS GRAFT     EYE SURGERY     Cataracts removed   MELANOMA EXCISION     OVARIAN CYST REMOVAL     EXPLORATORY LAPAROTOMY   POLYPECTOMY  01/03/2023   Procedure: POLYPECTOMY INTESTINAL;  Surgeon: Unk Corinn Skiff, MD;  Location: [facility] ENDOSCOPY;  Service: Gastroenterology;;   TONSILLECTOMY      Family History  Problem Relation Age of Onset   Stroke Mother    Thyroid  nodules Mother    Lung cancer Mother    Cancer Mother    Hypertension Mother    Diabetes Sister    Hypertension Sister    Aortic aneurysm Father    Early death Father    Heart disease Father    Obesity Father    Stroke Father    Breast cancer Maternal Aunt    Bone cancer Maternal Uncle    Depression Daughter    Anxiety disorder Daughter    Depression Daughter     Social History   Tobacco Use   Smoking status: Never   Smokeless tobacco: Never  Substance Use Topics   Alcohol use: Not Currently     Current Outpatient Medications:    amiodarone  (PACERONE ) 200 MG tablet, Take 1 tablet (200 mg total) by mouth daily., Disp: 30 tablet, Rfl: 0   apixaban (ELIQUIS) 5 MG TABS tablet, Take 5 mg by mouth 2 (two) times daily., Disp: , Rfl:    Ascorbic Acid (VITAMIN C) 1000 MG tablet, Take 1,000 mg by mouth in the morning and at bedtime., Disp: , Rfl:    atorvastatin  (LIPITOR) 40 MG tablet, Take 1 tablet (40 mg total) by mouth 2 (two) times a  week., Disp: 24 tablet, Rfl: 1   Coenzyme Q10 100 MG TABS, Take 100 mg by mouth daily., Disp: , Rfl:    furosemide (LASIX) 20 MG tablet, Take 20 mg by mouth daily., Disp: , Rfl:    JARDIANCE 10 MG TABS tablet, Take 10 mg by mouth daily., Disp: , Rfl:    Magnesium 500 MG CAPS, Take 800 mg by mouth in the morning and at bedtime., Disp: , Rfl:    metoprolol  succinate (TOPROL -XL) 50 MG 24 hr tablet, Take 50 mg by mouth in the morning and at bedtime., Disp: , Rfl:    Omega-3 Fatty Acids (OMEGA 3 PO), Take 345 capsules by mouth in the morning and at bedtime. Natural NORDIC, Disp: , Rfl:    traMADol  (ULTRAM ) 50 MG tablet, Take 50 mg by mouth every 6 (six) hours as needed for moderate pain (pain score 4-6)., Disp: , Rfl:    Vitamin D -Vitamin K (VITAMIN K2-VITAMIN D3 PO), Take 2 capsules by mouth daily. 5000 units Vitamin D3 + 90 mcg of Vitamin K2, Disp: , Rfl:    vitamin E 200  UNIT capsule, Take 200 Units by mouth 2 (two) times daily., Disp: , Rfl:   Allergies  Allergen Reactions   Ace Inhibitors Other (See Comments) and Shortness Of Breath    UNKNOWN REACTION   Protamine Other (See Comments)    Severe hypotension refractory to pressor and volume administration, only reversed by administration of epinephrine.   Duloxetine  Other (See Comments)    Drowsiness    I personally reviewed active problem list, medication list, allergies, family history with the patient/caregiver today.   ROS  Ten systems reviewed and is negative except as mentioned in HPI    Objective Physical Exam VITALS: BP- 130/82 MEASUREMENTS: BMI- 40.08. CONSTITUTIONAL: Patient appears well-developed and well-nourished.  No distress. HEENT: Head atraumatic, normocephalic, neck supple. CARDIOVASCULAR: Normal rate, regular rhythm and normal heart sounds.  No murmur heard. No BLE edema. PULMONARY: Effort normal and breath sounds normal. No respiratory distress. ABDOMINAL: There is no tenderness or distention. MUSCULOSKELETAL:  Normal gait. Without gross motor or sensory deficit. PSYCHIATRIC: Patient has a normal mood and affect. behavior is normal. Judgment and thought content normal.  Vitals:   01/17/24 1104  BP: 130/82  Pulse: 75  Resp: 16  SpO2: 97%  Weight: 267 lb 8 oz (121.3 kg)  Height: 5' 8.5 (1.74 m)    Body mass index is 40.08 kg/m.    PHQ2/9:    01/17/2024   10:59 AM 11/05/2023    1:15 PM 10/17/2023   11:37 AM 07/18/2023   10:50 AM 02/20/2023   10:42 AM  Depression screen PHQ 2/9  Decreased Interest 1 0 0 0 0  Down, Depressed, Hopeless 1 0 0 0 0  PHQ - 2 Score 2 0 0 0 0  Altered sleeping 0 0 0 0 0  Tired, decreased energy 2 0 0 0 0  Change in appetite 0 0 0 0 0  Feeling bad or failure about yourself  0 0 0 0 0  Trouble concentrating 1 0 0 0 0  Moving slowly or fidgety/restless 0 0 0 0 0  Suicidal thoughts 0 0 0 0 0  PHQ-9 Score 5 0  0  0  0   Difficult doing work/chores Somewhat difficult Not difficult at all Not difficult at all Not difficult at all Not difficult at all     Data saved with a previous flowsheet row definition    phq 9 is positive  Fall Risk:    01/17/2024   10:59 AM 11/05/2023    1:15 PM 10/17/2023   11:40 AM 10/10/2023    1:48 PM 07/18/2023   10:46 AM  Fall Risk   Falls in the past year? 0 1 1 0 0  Number falls in past yr: 0 0 0 0 0  Injury with Fall? 0 0  0  1  0   Risk for fall due to : No Fall Risks Impaired balance/gait   No Fall Risks  Follow up Falls evaluation completed Falls evaluation completed Falls evaluation completed;Falls prevention discussed  Falls prevention discussed;Education provided;Falls evaluation completed     Data saved with a previous flowsheet row definition     Assessment & Plan Lymphedema with chronic pain Chronic lymphedema with worsening pain, particularly in the left hip. Pain management with tramadol  is inconsistent. Compression pumps provide temporary relief but are not used regularly. Pain limits mobility and daily  activities. - Continue tramadol  50 mg as needed for pain. - Encouraged regular use of compression pumps multiple times a day. - Advised  taking Tylenol  regularly to prevent pain escalation.  Major depressive disorder Symptoms of feeling down, lack of interest, and existential concerns. Previous use of Lexapro  was effective but interacts with amiodarone . Sertraline  chosen as an alternative due to its safety profile with current medications. - Prescribed sertraline , starting with half a pill daily for one week, then increase to one pill daily. - Scheduled follow-up in three months to assess response to treatment.  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 Managed with amiodarone  for rhythm control and Eliquis for anticoagulation. No current symptoms of palpitations or shortness of breath. - Continue amiodarone  200 mg daily. - Continue Eliquis 5 mg twice daily. - Ordered TSH to monitor thyroid  function due to amiodarone  use. - Ordered comprehensive metabolic panel, lipid panel, and CBC.  Morbid obesity BMI of 40.08, likely exacerbated by fluid retention from lymphedema. Dietary habits include two meals a day with some high-sodium foods.  Essential hypertension Blood pressure slightly elevated at 130/82 mmHg. Managed with current medications. - Continue current antihypertensive regimen.  Hyperlipidemia Managed with atorvastatin . - Continue atorvastatin . - Ordered lipid panel to monitor cholesterol levels.  Hypercalcemia Slightly elevated calcium  levels. Plan to evaluate parathyroid function. - Ordered parathyroid hormone level to assess for hyperparathyroidism.

## 2024-01-18 LAB — COMPREHENSIVE METABOLIC PANEL WITH GFR
AG Ratio: 1.8 (calc) (ref 1.0–2.5)
ALT: 25 U/L (ref 6–29)
AST: 24 U/L (ref 10–35)
Albumin: 4.7 g/dL (ref 3.6–5.1)
Alkaline phosphatase (APISO): 87 U/L (ref 37–153)
BUN: 21 mg/dL (ref 7–25)
CO2: 27 mmol/L (ref 20–32)
Calcium: 10.5 mg/dL — ABNORMAL HIGH (ref 8.6–10.4)
Chloride: 105 mmol/L (ref 98–110)
Creat: 0.83 mg/dL (ref 0.60–1.00)
Globulin: 2.6 g/dL (ref 1.9–3.7)
Glucose, Bld: 113 mg/dL — ABNORMAL HIGH (ref 65–99)
Potassium: 4.1 mmol/L (ref 3.5–5.3)
Sodium: 142 mmol/L (ref 135–146)
Total Bilirubin: 0.6 mg/dL (ref 0.2–1.2)
Total Protein: 7.3 g/dL (ref 6.1–8.1)
eGFR: 75 mL/min/1.73m2 (ref >=60)

## 2024-01-18 LAB — LIPID PANEL
Cholesterol: 190 mg/dL (ref <200)
HDL: 84 mg/dL (ref >=50)
LDL Cholesterol (Calc): 86 mg/dL
Non-HDL Cholesterol (Calc): 106 mg/dL (ref <130)
Total CHOL/HDL Ratio: 2.3 (calc) (ref <5.0)
Triglycerides: 108 mg/dL (ref <150)

## 2024-01-18 LAB — CBC WITH DIFFERENTIAL/PLATELET
Absolute Lymphocytes: 1244 {cells}/uL (ref 850–3900)
Absolute Monocytes: 566 {cells}/uL (ref 200–950)
Basophils Absolute: 44 {cells}/uL (ref 0–200)
Basophils Relative: 0.5 %
Eosinophils Absolute: 26 {cells}/uL (ref 15–500)
Eosinophils Relative: 0.3 %
HCT: 47.1 % — ABNORMAL HIGH (ref 35.9–46.0)
Hemoglobin: 15.8 g/dL — ABNORMAL HIGH (ref 11.7–15.5)
MCH: 33.5 pg — ABNORMAL HIGH (ref 27.0–33.0)
MCHC: 33.5 g/dL (ref 31.6–35.4)
MCV: 99.8 fL (ref 81.4–101.7)
MPV: 10.4 fL (ref 7.5–12.5)
Monocytes Relative: 6.5 %
Neutro Abs: 6821 {cells}/uL (ref 1500–7800)
Neutrophils Relative %: 78.4 %
Platelets: 309 Thousand/uL (ref 140–400)
RBC: 4.72 Million/uL (ref 3.80–5.10)
RDW: 12.6 % (ref 11.0–15.0)
Total Lymphocyte: 14.3 %
WBC: 8.7 Thousand/uL (ref 3.8–10.8)

## 2024-01-18 LAB — EXTRA

## 2024-01-18 LAB — PTH, INTACT AND CALCIUM
Calcium: 10.5 mg/dL — ABNORMAL HIGH (ref 8.6–10.4)
PTH: 81 pg/mL — ABNORMAL HIGH (ref 16–77)

## 2024-01-18 LAB — VITAMIN D 25 HYDROXY (VIT D DEFICIENCY, FRACTURES): Vit D, 25-Hydroxy: 65 ng/mL (ref 30–100)

## 2024-01-18 LAB — TSH: TSH: 0.67 m[IU]/L (ref 0.40–4.50)

## 2024-01-20 ENCOUNTER — Ambulatory Visit: Payer: Self-pay | Admitting: Family Medicine

## 2024-01-21 ENCOUNTER — Other Ambulatory Visit: Payer: Self-pay | Admitting: Family Medicine

## 2024-01-21 DIAGNOSIS — E213 Hyperparathyroidism, unspecified: Secondary | ICD-10-CM

## 2024-01-22 ENCOUNTER — Ambulatory Visit: Attending: Family Medicine | Admitting: Occupational Therapy

## 2024-01-22 ENCOUNTER — Encounter: Payer: Self-pay | Admitting: Occupational Therapy

## 2024-01-22 DIAGNOSIS — I890 Lymphedema, not elsewhere classified: Secondary | ICD-10-CM | POA: Insufficient documentation

## 2024-01-22 NOTE — Therapy (Signed)
 OUTPATIENT OCCUPATIONAL THERAPY EVALUATION  BILATERAL LOWER EXTREMITY LIPO-LYMPHEDEMA   Patient Name: Melody Soto MRN: [id number] DOB:01-13-1951, 73 y.o., female Today's Date: 01/22/2024  END OF SESSION:   Past Medical History:  Diagnosis Date   Actinic keratosis    Aortic stenosis    Blood transfusion without reported diagnosis Nov. 2023   While hospitalized @ [facility].   Cataract    Removed   CHF (congestive heart failure) (HCC)    Depression    Dysrhythmia    Endocarditis of prosthetic valve    Endometrial cancer determined by uterine biopsy (HCC) 10/29/2016   Rad tx's and internal brachytherapy.   Heart murmur    HX OF   History of cardioversion    Hyperlipidemia    Hypertension    CONTROLLED ON MEDS   Lymphedema    Melanoma (HCC)    Left upper arm. 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   AORTIC VALVE REPLACEMENT  12/15/2021   CARDIAC CATHETERIZATION     CARDIAC VALVE REPLACEMENT  02/27/2017   12/15/21   CARDIOVERSION  10/2019   cataract surgery Bilateral 05/24/2014   second eye 06/07/2014   COLONOSCOPY WITH PROPOFOL  N/A 09/24/2019   Procedure: COLONOSCOPY WITH PROPOFOL ;  Surgeon: Janalyn Keene NOVAK, MD;  Location: [facility] ENDOSCOPY;  Service: Gastroenterology;  Laterality: N/A;   COLONOSCOPY WITH PROPOFOL  N/A 12/10/2019   Procedure: COLONOSCOPY WITH PROPOFOL ;  Surgeon: Janalyn Keene NOVAK, MD;  Location: [facility] ENDOSCOPY;  Service: Endoscopy;  Laterality: N/A;   COLONOSCOPY WITH PROPOFOL  N/A 01/03/2023   Procedure: COLONOSCOPY WITH PROPOFOL ;  Surgeon: Unk Corinn Skiff, MD;  Location: [facility] ENDOSCOPY;  Service: Gastroenterology;  Laterality: N/A;   CORONARY ARTERY BYPASS GRAFT     EYE SURGERY     Cataracts removed   MELANOMA EXCISION     OVARIAN CYST REMOVAL     EXPLORATORY LAPAROTOMY   POLYPECTOMY  01/03/2023    Procedure: POLYPECTOMY INTESTINAL;  Surgeon: Unk Corinn Skiff, MD;  Location: [facility] ENDOSCOPY;  Service: Gastroenterology;;   TONSILLECTOMY     Patient Active Problem List   Diagnosis Date Noted   Primary osteoarthritis of left hip 11/05/2023   Polyp of ascending colon 01/03/2023   Mild protein-calorie malnutrition 05/15/2022   Thrombus of aorta (HCC) 05/15/2022   Morbid obesity (HCC) 05/15/2022   Hypercalcemia 07/28/2021   Chronic combined systolic and diastolic congestive heart failure (HCC) 07/28/2021   History of colon polyps 07/28/2021   Elevated hematocrit 07/28/2021   Vitamin D  deficiency 07/28/2021   Hyperglycemia 07/28/2021   Skin lesion 07/28/2021   Mild major depression 12/23/2020   Obesity (BMI 30.0-34.9) 12/23/2020   Thoracic aorta atherosclerosis 12/23/2020   Subclavian vein occlusion, 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   Paroxysmal atrial fibrillation (HCC) 10/02/2019   Diverticulosis of colon 08/08/2018   Cholelithiasis 08/08/2018   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   Chronic acquired lymphedema 01/08/2017   Left ventricular hypertrophy 11/27/2014   Benign essential HTN 08/21/2014   Dyslipidemia 08/21/2014   Cardiac dysrhythmia 08/21/2014   Dysmetabolic syndrome 08/21/2014   Female genuine stress incontinence 08/21/2014   History of shingles 08/21/2014   Personal history of skin cancer 11/24/2009    PCP: Keller Pickler, MD  REFERRING PROVIDER: same  REFERRING DIAG: I89.0  THERAPY DIAG:  Lymphedema, not elsewhere classified  Rationale for Evaluation and Treatment: Rehabilitation  ONSET DATE: Chronic, onset  in childhood before puberty  SUBJECTIVE:                                                                                                                                                                                           SUBJECTIVE STATEMENT: [name] is referred to Occupational Therapy by Keller Pickler, MD for  Evaluation and treatment of BLE Lipo-Lymphedema 2/2 primary Lipedema. Pt arrives seated in a transport wc and uses a cane to ambulate ~ 10 steps in clinic from wc to Rx bed. Pt requires extra time and assistive device (Modified independence) to lift legs onto Rx bed to complete transfer. Transfers, ambulation and limited bed mobility caused increased pain in the L hip for several minutes afterwards.  Pt is well known to this OT as she successfully underwent 2 courses of both Intensive and Management Phase Complete Decongestive Therapy (CDT). As expected she no change in lipedema, but she did gain a modest reduction in limb volumes and experienced decreased pain using custom compression garments and an advanced , sequential, pneumatic, compression device, or Flexitouch Plus, to simulate manual lymphatic drainage (MLD).  Pt reports her custom garments no longer fit because she has gained weight due to significant decrease in activity level and worsening limb swelling. She also has difficulty donning and doffing them due to the physical demands, due to body habitus limiting AROM, and due to increased pain the L hip. Pt is now retired from her part-time caregiver job and is now sedentary most of the day. She no longer uses the Flexitouch as it too is difficult to don and doff,  but she uses a, off the shelf, non-medical grade, knee length, sequential pneumatic pump that partially covers her foot and comes to just below her knee.   Ms Borcherding's goals for OT are to resume lymphatic massage (MLD), but not to resume compression bandages, or to replace custom compression stockings.She is seeking pain reduction in her legs and increased lymphedema management to keep her legs from getting worse.  PERTINENT HISTORY:  Primary osteoarthritis of left hip  Thrombus of aorta (HCC)  Morbid obesity (HCC)  Hypercalcemia  Chronic combined systolic and diastolic congestive heart failure (HCC)  Orthostatic Hypotension  SOB   Hyperglycemia  Skin lesion  Mild major depression  Obesity (BMI 30.0-34.9)  Thoracic aorta atherosclerosis  Subclavian vein occlusion, right (HCC)  Paroxysmal atrial fibrillation (HCC)  Hx Melanoma  10/2016 Endometrial Cancer  S/P TAVR (transcatheter aortic valve replacement)  Chronic acquired lymphedema  Left ventricular hypertrophy  Benign essential HTN  Dyslipidemia  Cardiac dysrhythmia  Dysmetabolic syndrome   HEIGHT: 5.8.5 WEIGHT: 267 lb  BMI: 40.08 kg/m2  PAIN:  Are you having pain? Yes: NPRS scale: 7/10 Pain location: BLE Pain description: worsens at night in bed; heaviness, tight, deep aching Aggravating factors: salty diet, extended sitting, standing, walking Relieving factors: rubbing legs, using pump  PRECAUTIONS: Fall and Other: LYMPHEDEMA PRECAUTIONS: CARDIAC  RED FLAGS: Hx Aortic valve replacement x 2, CHF, AFIB, R subclavian vein obstruction, Hx Melanoma, Hx Endometrial Ca   WEIGHT BEARING RESTRICTIONS: No  FALLS:  Has patient fallen in last 6 months? 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  COGNITION:  Overall cognitive status: Within functional limits for tasks assessed   OBSERVATIONS / OTHER ASSESSMENTS:   POSTURE: WFL  LE ROM: RLE WNL; LLE painful at end ranges in all planes  LE MMT: NT  LYMPHEDEMA  ASSESSMENTS:   SURGERY TYPE/DATE: S/p Endometrial ca 2018  NUMBER OF LYMPH NODES REMOVED: unknown  CHEMOTHERAPY: unknown  RADIATION: brachytherapy and external XRT  HORMONE TREATMENT: unknown  INFECTIONS: TBA  Hx WOUNDS: TBA   LYMPHEDEMA ASSESSMENTS:   BLE COMPARATIVE LIMB VOLUMETRICS TBA OT Rx 1  LANDMARK RIGHT    R LEG (A-D) N/A  R THIGH (E-G) ml  R FULL LIMB (A-G) ml  Limb Volume differential (LVD)  %  Volume change since initial %  Volume change overall V  (Blank rows = not tested)  LANDMARK LEFT    L LEG (A-D) N/A  R THIGH (E-G) ml  R FULL LIMB (A-G) ml  Limb Volume differential (LVD)  %  Volume change since initial %  Volume change overall %  (Blank rows = not tested)    GAIT: Distance walked: 10 feet Assistive device utilized: Quad cane small base Level of assistance: Modified independence Comments: limited by decreased left hip pain exacerbated by weight of excessive fat distributed in legs and bilateral lower quadrants 2/2 LIPEDEMA  LYMPHEDEMA LIFE IMPACT SCALE (LLIS): TBS next session (The extent to which LE related problems affected your life last week.)                                                                                                                           TREATMENT THIS DATE:  OT initial evaluation Pt edu for CDT and LE self care  PATIENT EDUCATION:  Eval edu Discussed differential diagnoses for various swelling disorders. Provided basic level education regarding lymphatic structure and function, etiology, onset patterns,  stages of progression, and prevention to limit infection risk, worsening condition and further functional decline. Pt edu for aught interaction between blood circulatory system and lymphatic circulation.Discussed  impact of gravity and co-morbidities on lymphatic function. Outlined Complete Decongestive Therapy (CDT)  as standard of care and provided in depth information regarding 4 primary components of Intensive  and Self Management Phases, including Manual Lymph Drainage (MLD), compression wrapping and garments, skin care, and therapeutic exercise. Dempsey discussion with re need for frequent attendance and high burden of care when caregiver is needed, impact of co morbidities. We discussed  the chronic, progressive nature of lymphedema and Importance of daily, ongoing LE self-care essential for limiting progression and infection risk.  Person educated: Patient  Education method: Explanation, Demonstration, and Handouts Education comprehension: verbalized understanding, returned demonstration, verbal cues required, and needs further education  LYMPHEDEMA SELF-CARE HOME PROGRAM for Complete Decongestive Therapy:  : BLE lymphatic pumping there ex- 1 set of 10 each element, in order. Hold 5. 2 x daily  Daily, short stretch, thigh or knee length, multilayer compression bandages to one leg at a time during Intensive Phase CDT   During self-management Phase of CDT fit Pt with BLE custom compression garments and HOS devices bilaterally to control swelling. Custom-made gradient compression garments and HOS devices are medically necessary because they are uniquely sized and shaped to fit the exact dimensions of the affected extremities, and to provide appropriate medical grade, graduated compression essential for optimally managing chronic, progressive lymphedema. Multiple custom compression garments are needed to ensure proper hygiene to limit infection risk. Custom compression garments should be replaced q 3-6 months When worn consistently for optimal lipo-lymphedema self-management over time. HOS devices, medically necessary to limit fibrosis buildup in tissue, should be replaced q 2 years and PRN when worn out.    4. Consider replacing non-medical grade pneumatic compression pump with tactile Medical, thigh length, basic NIMBL. Check insurance for coverage ASAP.  5. Daily simple self MLD used in conjunction with  pneumatic device to engage thoracic duct return. NO STROKES TO NECK AND SUBCLAVIAN TERMINUS.  6. Daily skin care with low ph lotion matching skin ph to reduce infection risk.  7. Leg elevation when seated    ASSESSMENT:  CLINICAL IMPRESSION: [name] is a 73 yo female presenting stage II, BLE, Lipo- lymphedema 2/2 primary Lipidema. Inflammation due to osteoarthritis is a contributing complication. Lipo-lymphedema limits Pt's functional performance in all occupational domains, including basic and instrumental ADLs,  productive activities, leisure pursuits, social participation and quality of life. Ms Riner will benefit from skilled Occupational Therapy for Complete Decongestive Therapy, (CDT), the gold standard protocol for lymphedema care, which includes manual lymphatic drainage (MLD), therapeutic lymphatic pumping exercise, skin care and compression wrapping to one limb at a time, then fitting compression garments , or alternatives.  Ms. Joshi opts out of compression bandaging due to difficulty reaching feet and distal legs, and he has no assistance with self-care at home at present.   Typically the goals of Intensive Phase CDT are to reduce limb swelling and associated pain, to reduce infection risk and to limit lymphedema progression. In this case we'll focus on fitting custom  compression garment alternatives that Pt is able to don and doff as independently as possible in an effort to limit progression. Pt will benefit from fitting with bilateral leg, wrap style, adjustable Velcro, compression stocking alternatives:CircAid, Juxtafit Essentials, with 3 pr of black, compressive, under sock liners, custom foot pieces, and black cover ups. Pt  should be fit with custom, BLE, Jobst RELAX , ccl 2 , convoluted Hours of Sleep devices to limit fibrosis formation and pain in supine positioning in bed.  Custom-made gradient compression garments and HOS devices are medically necessary because they  are uniquely sized and shaped to fit the exact dimensions of the affected extremities, and to provide appropriate medical grade, graduated compression essential for optimally managing chronic, progressive lymphedema. Multiple custom compression garments are needed to ensure proper hygiene to limit infection risk. Custom compression garments should be replaced q 3-6 months When worn consistently for optimal lipo-lymphedema self-management over time. HOS devices, medically necessary to limit fibrosis buildup in tissue, should be replaced q 2 years and PRN when worn out.       OBJECTIVE IMPAIRMENTS: Abnormal gait, cardiopulmonary status limiting activity, decreased activity tolerance, decreased endurance, decreased knowledge of condition, decreased knowledge of use of DME, decreased mobility, difficulty walking, decreased ROM, hypomobility, increased edema, impaired flexibility, obesity, pain, and chronic BLE / BLQ swelling, including hips, buttocks, legs, thighs and feet.   ACTIVITY LIMITATIONS: Functional mobility and functional ambulation (lifting, sitting, standing, squatting, stairs, all transfers, bed mobility) Basic and instrumental ADLS ( lower body bathing, toileting, lower body dressing, hygiene/grooming (nail care, skin care), home management, cooking, cleaning, shopping, driving) Productive activities (work duties, caring for others) Leisure pursuits/ social participation[ation ( difficulty leaving home to socialize with friends and family,  and to engage in community activities) and quality of live ( impaired body image, loneliness, isolation, embarrassment, low self-esteem).  PERSONAL FACTORS: Age, Behavior pattern, Fitness, Past/current experiences, Social background, Time since onset of injury/illness/exacerbation, and 3+ comorbidities: OA, Lipedema, obesity , limited family and social support, are also affecting patient's functional outcome.   REHAB POTENTIAL: Fair      EVALUATION  COMPLEXITY: Moderate   GOALS: Goals reviewed with patient? Yes   SHORT TERM GOALS: Target date: 4th OT Rx visit, or by DC    Pt will demonstrate understanding of lymphedema precautions and prevention strategies with modified independence using a printed reference to identify at least 5 precautions and discussing how s/he may implement them into daily life to reduce risk of progression with extra time. Baseline:Max A Goal status:INITIAL   2   Pt will obtain appropriate compression garments/devices and achieve modified independence (extra time + assistive devices) with donning/doffing to optimize limb volume   reductions and limit LE progression over time.  Baseline: Dependent Goal status:INITIAL     PLAN:  OT FREQUENCY: 1x/week  PT DURATION: 4-6 weeks; duration is dependent on time needed to obtain custom compression garment alternatives thru local DME vendor using Medicaid and Medicare benefits  PLANNED INTERVENTIONS: Compression therapy: Fit Pt with appropriate compression that she can don and doff- consider adjustable, Velcro wrap legging style leggings; [id number]-re teach and perform Therapeutic lymphedema exercises, 97530- Therapeutic activity, 97535- lymphedema Self Care, [id number]- Manual therapy, Patient/Family education, and DME instructions  PLAN FOR NEXT SESSION: Complete comparative limb volumetrics bilaterally Send insurance info to garment vendor for benefits check Measure for CircAid Juxtafit essential knee length compression garment wrap alternatives   Zebedee Dec, MS, OTR/L, CLT-LANA 01/22/24 10:44 AM  01/22/2024, 10:44 AM

## 2024-01-29 ENCOUNTER — Ambulatory Visit: Admitting: Occupational Therapy

## 2024-01-29 DIAGNOSIS — I890 Lymphedema, not elsewhere classified: Secondary | ICD-10-CM | POA: Diagnosis not present

## 2024-01-29 NOTE — Therapy (Signed)
 OUTPATIENT OCCUPATIONAL THERAPY TREATMENT NOTE  BILATERAL LOWER EXTREMITY LIPO-LYMPHEDEMA   Patient Name: Melody Soto MRN: [id number] DOB:1950-12-18, 73 y.o., female Today's Date: 01/29/2024  END OF SESSION:   OT End of Session - 01/29/24 1302     Visit Number 2    Number of Visits 6    Date for Recertification  04/21/24    OT Start Time 0102    OT Stop Time 0224    OT Time Calculation (min) 82 min    Activity Tolerance No increased pain;Patient tolerated treatment well;Other (comment)    Behavior During Therapy [facility] for tasks assessed/performed            Past Medical History:  Diagnosis Date   Actinic keratosis    Aortic stenosis    Blood transfusion without reported diagnosis Nov. 2023   While hospitalized @ [facility].   Cataract    Removed   CHF (congestive heart failure) (HCC)    Depression    Dysrhythmia    Endocarditis of prosthetic valve    Endometrial cancer determined by uterine biopsy (HCC) 10/29/2016   Rad tx's and internal brachytherapy.   Heart murmur    HX OF   History of cardioversion    Hyperlipidemia    Hypertension    CONTROLLED ON MEDS   Lymphedema    Melanoma (HCC)    Left upper arm. 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   AORTIC VALVE REPLACEMENT  12/15/2021   CARDIAC CATHETERIZATION     CARDIAC VALVE REPLACEMENT  02/27/2017   12/15/21   CARDIOVERSION  10/2019   cataract surgery Bilateral 05/24/2014   second eye 06/07/2014   COLONOSCOPY WITH PROPOFOL  N/A 09/24/2019   Procedure: COLONOSCOPY WITH PROPOFOL ;  Surgeon: Janalyn Keene NOVAK, MD;  Location: [facility] ENDOSCOPY;  Service: Gastroenterology;  Laterality: N/A;   COLONOSCOPY WITH PROPOFOL  N/A 12/10/2019   Procedure: COLONOSCOPY WITH PROPOFOL ;  Surgeon: Janalyn Keene NOVAK, MD;  Location: [facility] ENDOSCOPY;  Service: Endoscopy;  Laterality: N/A;    COLONOSCOPY WITH PROPOFOL  N/A 01/03/2023   Procedure: COLONOSCOPY WITH PROPOFOL ;  Surgeon: Unk Corinn Skiff, MD;  Location: [facility] ENDOSCOPY;  Service: Gastroenterology;  Laterality: N/A;   CORONARY ARTERY BYPASS GRAFT     EYE SURGERY     Cataracts removed   MELANOMA EXCISION     OVARIAN CYST REMOVAL     EXPLORATORY LAPAROTOMY   POLYPECTOMY  01/03/2023   Procedure: POLYPECTOMY INTESTINAL;  Surgeon: Unk Corinn Skiff, MD;  Location: [facility] ENDOSCOPY;  Service: Gastroenterology;;   TONSILLECTOMY     Patient Active Problem List   Diagnosis Date Noted   Primary osteoarthritis of left hip 11/05/2023   Polyp of ascending colon 01/03/2023   Mild protein-calorie malnutrition 05/15/2022   Thrombus of aorta (HCC) 05/15/2022   Morbid obesity (HCC) 05/15/2022   Hypercalcemia 07/28/2021   Chronic combined systolic and diastolic congestive heart failure (HCC) 07/28/2021   History of colon polyps 07/28/2021   Elevated hematocrit 07/28/2021   Vitamin D  deficiency 07/28/2021   Hyperglycemia 07/28/2021   Skin lesion 07/28/2021   Mild major depression 12/23/2020   Obesity (BMI 30.0-34.9) 12/23/2020   Thoracic aorta atherosclerosis 12/23/2020   Subclavian vein occlusion, 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   Paroxysmal atrial fibrillation (HCC) 10/02/2019   Diverticulosis of colon 08/08/2018   Cholelithiasis 08/08/2018   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  Chronic acquired lymphedema 01/08/2017   Left ventricular hypertrophy 11/27/2014   Benign essential HTN 08/21/2014   Dyslipidemia 08/21/2014   Cardiac dysrhythmia 08/21/2014   Dysmetabolic syndrome 08/21/2014   Female genuine stress incontinence 08/21/2014   History of shingles 08/21/2014   Personal history of skin cancer 11/24/2009    PCP: Keller Pickler, MD  REFERRING PROVIDER: same  REFERRING DIAG: I89.0  THERAPY DIAG:  Lymphedema, not elsewhere classified  Rationale for Evaluation and Treatment:  Rehabilitation  ONSET DATE: Chronic, onset in childhood before puberty  SUBJECTIVE:                                                                                                                                                                                           SUBJECTIVE STATEMENT: Ms Barga returns today for initial Rx for BLE lymphedema. Lymphedema related pain and swelling are unchanged since initial eval. Pt has no new complaints. Pt is in agreement with plan to complete anatomical measurements for replacements for worn out, BLE custom daytime and HOS lymphedema compression garments/ devices.   (01/22/24 OT Initial Evaluation:  Melody Soto is referred to Occupational Therapy by Keller Pickler, MD for Evaluation and treatment of BLE Lipo-Lymphedema 2/2 primary Lipedema. Pt arrives seated in a transport wc and uses a cane to ambulate ~ 10 steps in clinic from wc to Rx bed. Pt requires extra time and assistive device (Modified independence) to lift legs onto Rx bed to complete transfer. Transfers, ambulation and limited bed mobility caused increased pain in the L hip for several minutes afterwards.  Pt is well known to this OT as she successfully underwent 2 courses of both Intensive and Management Phase Complete Decongestive Therapy (CDT). As expected she no change in lipedema, but she did gain a modest reduction in limb volumes and experienced decreased pain using custom compression garments and an advanced , sequential, pneumatic, compression device, or Flexitouch Plus, to simulate manual lymphatic drainage (MLD).  Pt reports her custom garments no longer fit because she has gained weight due to significant decrease in activity level and worsening limb swelling. She also has difficulty donning and doffing them due to the physical demands, due to body habitus limiting AROM, and due to increased pain the L hip. Pt is now retired from her part-time caregiver job and is now sedentary most  of the day. She no longer uses the Flexitouch as it too is difficult to don and doff,  but she uses a, off the shelf, non-medical grade, knee length, sequential pneumatic pump that partially covers her foot and comes to just below her knee.   Ms Ladnier's goals  for OT are to resume lymphatic massage (MLD), but not to resume compression bandages, or to replace custom compression stockings.She is seeking pain reduction in her legs and increased lymphedema management to keep her legs from getting worse.)  PERTINENT HISTORY:  Primary osteoarthritis of left hip  Thrombus of aorta (HCC)  Morbid obesity (HCC)  Hypercalcemia  Chronic combined systolic and diastolic congestive heart failure (HCC)  Orthostatic Hypotension  SOB  Hyperglycemia  Skin lesion  Mild major depression  Obesity (BMI 30.0-34.9)  Thoracic aorta atherosclerosis  Subclavian vein occlusion, right (HCC)  Paroxysmal atrial fibrillation (HCC)  Hx Melanoma  10/2016 Endometrial Cancer  S/P TAVR (transcatheter aortic valve replacement)  Chronic acquired lymphedema  Left ventricular hypertrophy  Benign essential HTN  Dyslipidemia  Cardiac dysrhythmia  Dysmetabolic syndrome   HEIGHT: 5.8.5 WEIGHT: 267 lb BMI: 40.08 kg/m2  PAIN:  Are you having pain? Yes: NPRS scale: 7/10 Pain location: BLE Pain description: worsens at night in bed; heaviness, tight, deep aching Aggravating factors: salty diet, extended sitting, standing, walking Relieving factors: rubbing legs, using pump  PRECAUTIONS: Fall and Other: LYMPHEDEMA PRECAUTIONS: CARDIAC  RED FLAGS: Hx Aortic valve replacement x 2, CHF, AFIB, R subclavian vein obstruction, Hx Melanoma, Hx Endometrial Ca   WEIGHT BEARING RESTRICTIONS: No  FALLS:  Has patient fallen in last 6 months? 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  COGNITION:  Overall cognitive status: Within functional limits for tasks assessed   OBSERVATIONS / OTHER ASSESSMENTS:   POSTURE: WFL  LE ROM: RLE WNL; LLE painful at end ranges in all planes  LE MMT: NT  LYMPHEDEMA ASSESSMENTS:   SURGERY TYPE/DATE: S/p Endometrial ca 2018  NUMBER OF LYMPH NODES REMOVED: unknown  CHEMOTHERAPY: unknown  RADIATION: brachytherapy and external XRT  HORMONE TREATMENT: unknown  INFECTIONS: TBA  Hx WOUNDS: TBA   LYMPHEDEMA ASSESSMENTS:   BLE COMPARATIVE LIMB VOLUMETRICS TBA OT Rx 1  LANDMARK RIGHT    R LEG (A-D) N/A  R THIGH (E-G) ml  R FULL LIMB (A-G) ml  Limb Volume differential (LVD)  %  Volume change since initial %  Volume change overall V  (Blank rows = not tested)  LANDMARK LEFT    L LEG (A-D) N/A  R THIGH (E-G) ml  R FULL LIMB (A-G) ml  Limb Volume differential (LVD)  %  Volume change since initial %  Volume change overall %  (Blank rows = not tested)    GAIT: Distance walked: 10 feet Assistive device utilized: Quad cane small base Level of assistance: Modified independence Comments: limited by decreased left hip pain exacerbated by weight of excessive fat distributed in legs and bilateral lower quadrants 2/2 LIPEDEMA  LYMPHEDEMA LIFE IMPACT SCALE (LLIS): TBS next session (The extent to which LE related problems affected your life last week.)  TREATMENT THIS DATE:  Anatomical measurements for custom compression garments alternatives and HOS devices Pt edu for LE self care  PATIENT EDUCATION:  Continued Pt/ CG edu for lymphedema self care home program throughout session. Topics include outcome of comparative limb volumetrics- starting limb volume differentials (LVDs), technology and gradient techniques used for short stretch, multilayer compression wrapping, simple self-MLD, therapeutic lymphatic pumping exercises, skin/nail care, LE precautions, compression garment recommendations and specifications, wear and care schedule and compression garment donning / doffing w assistive devices. Discussed progress towards all OT goals since commencing CDT. Discussed detrimental impact of obesity on lower and upper extremity lymphedema over time. Reviewed OT goals for lymphedema care with Pt and discussed progress to date.  All questions answered to the Pt's satisfaction. Good return. Person educated: Patient  Education method: Explanation, Demonstration, and Handouts Education comprehension: verbalized understanding, returned demonstration, verbal cues required, and needs further education   LYMPHEDEMA SELF-CARE HOME PROGRAM for Complete Decongestive Therapy:  : BLE lymphatic pumping there ex- 1 set of 10 each element, in order. Hold 5. 2 x daily  Daily, short stretch, thigh or knee length, multilayer compression bandages to one leg at a time during Intensive Phase CDT   During self-management Phase of CDT fit Pt with BLE custom compression garments and HOS devices bilaterally to control swelling.   4. Consider replacing non-medical grade pneumatic compression pump with tactile Medical, thigh length, basic NIMBL. Check insurance for coverage ASAP.  5. Daily simple self MLD used in conjunction with pneumatic device to engage thoracic duct return. NO STROKES TO NECK AND SUBCLAVIAN TERMINUS.  6. Daily skin  care with low ph lotion matching skin ph to reduce infection risk.  7. Leg elevation when seated    ASSESSMENT:  CLINICAL IMPRESSION: Completed anatomical measurements for bilateral CircAid Barrister's Clerk for full-time daily use, and for custom JOBST RELAX for HOS. Submitted to DME vendor after session.   Pt should be fit with bilateral, knee length, adjustable, Velcro, wrap style, compression stocking alternatives to limit chronic leg swelling, associated pain, infection risk, and lymphedema progression.Fit with:    Quantity 2 RIGHT and 2 LEFT, Bilateral, knee length (A-D), CircAid, Juxtafit Essentials, ccl 2,  with zippers  for ease of donning due to chronic pain. Necessary accessories include 3 pr of black, compressive, under sock liners per leg, and one cover up per leg. If accessories are not covered by medical insurance, please quote out of pocket cost to Pt. Quantity 1 right and 1 left, custom CircAid foot pieces, medically necessary to control foot swelling so Pt is able to wear shoes  and to improve functional , safe ambulation and transfers.  BLE, Jobst RELAX , ccl 2 , convoluted Hours of Sleep devices to limit tissue fibrosis which forms during hours of sleep to progress lymphedema.  Custom-made gradient compression garments and HOS devices are medically necessary because they are uniquely sized and shaped to fit the exact dimensions of the affected extremities, and to provide appropriate medical grade, graduated compression essential for optimally managing chronic, progressive lymphedema. Multiple custom compression garments are needed to ensure proper hygiene to limit infection risk. Custom compression garments should be replaced q 3-6 months When worn consistently for optimal lipo-lymphedema self-management over time. HOS devices, medically necessary to limit fibrosis buildup in tissue, should be replaced q 2 years and PRN when worn out.   )    (01/22/24 OT Initial Evaluation:  Melody Soto is a 73 yo female presenting stage II, BLE, Lipo-  lymphedema 2/2 primary Lipidema. Inflammation due to osteoarthritis is a contributing complication. Lipo-lymphedema limits Pt's functional performance in all occupational domains, including basic and instrumental ADLs,  productive activities, leisure pursuits, social participation and quality of life. Ms Holsapple will benefit from skilled Occupational Therapy for Complete Decongestive Therapy, (CDT), the gold standard protocol for lymphedema care, which includes manual lymphatic drainage (MLD), therapeutic lymphatic pumping exercise, skin care and compression wrapping to one limb at a time, then fitting compression garments , or alternatives.  Ms. Mccaughey opts out of compression bandaging due to difficulty reaching feet and distal legs, and he has no assistance with self-care at home at present.   Typically the goals of Intensive Phase CDT are to reduce limb swelling and associated pain, to reduce infection risk and to limit lymphedema progression. In this case we'll focus on fitting custom  compression garment alternatives that Pt is able to don and doff as independently as possible in an effort to limit progression. Pt will benefit from fitting with bilateral leg, wrap style, adjustable Velcro, compression stocking alternatives:CircAid, Juxtafit Essentials, with 3 pr of black, compressive, under sock liners, custom foot pieces, and black cover ups. Pt should be fit with custom, BLE, Jobst RELAX , ccl 2 , convoluted Hours of Sleep devices to limit fibrosis formation and pain in supine positioning in bed   OBJECTIVE IMPAIRMENTS: Abnormal gait, cardiopulmonary status limiting activity, decreased activity tolerance, decreased endurance, decreased knowledge of condition, decreased knowledge of use of DME, decreased mobility, difficulty walking, decreased ROM, hypomobility, increased edema, impaired flexibility, obesity, pain, and chronic BLE / BLQ  swelling, including hips, buttocks, legs, thighs and feet.   ACTIVITY LIMITATIONS: Functional mobility and functional ambulation (lifting, sitting, standing, squatting, stairs, all transfers, bed mobility) Basic and instrumental ADLS ( lower body bathing, toileting, lower body dressing, hygiene/grooming (nail care, skin care), home management, cooking, cleaning, shopping, driving) Productive activities (work duties, caring for others) Leisure pursuits/ social participation[ation ( difficulty leaving home to socialize with friends and family,  and to engage in community activities) and quality of live ( impaired body image, loneliness, isolation, embarrassment, low self-esteem).  PERSONAL FACTORS: Age, Behavior pattern, Fitness, Past/current experiences, Social background, Time since onset of injury/illness/exacerbation, and 3+ comorbidities: OA, Lipedema, obesity , limited family and social support, are also affecting patient's functional outcome.   REHAB POTENTIAL: Fair      EVALUATION COMPLEXITY: Moderate   GOALS: Goals reviewed with patient? Yes   SHORT TERM GOALS: Target date: 4th OT Rx visit, or by DC    Pt will demonstrate understanding of lymphedema precautions and prevention strategies with modified independence using a printed reference to identify at least 5 precautions and discussing how s/he may implement them into daily life to reduce risk of progression with extra time. Baseline:Max A Goal status:PROGRESSING   2   Pt will obtain appropriate compression garments/devices and achieve modified independence (extra time + assistive devices) with donning/doffing to optimize limb volume   reductions and limit LE progression over time.  Baseline: Dependent Goal status:PROGRESSING  PLAN:  OT FREQUENCY: 1x/week  PT DURATION: 4-6 weeks; duration is dependent on time needed to obtain custom compression garment alternatives thru local DME vendor using Medicaid and Medicare  benefits  PLANNED INTERVENTIONS: Compression therapy: Fit Pt with appropriate compression that she can don and doff- consider adjustable, Velcro wrap legging style leggings; [id number]-re teach and perform Therapeutic lymphedema exercises, 97530- Therapeutic activity, 97535- lymphedema Self Care, [id number]- Manual therapy, Patient/Family education, and DME instructions  PLAN FOR NEXT SESSION:  Fit CircAid Juxtafit essential knee length compression garment wrap alternatives and HOS devices   Zebedee Dec, MS, OTR/L, CLT-LANA 01/29/2024 2:34 PM

## 2024-02-03 ENCOUNTER — Telehealth: Payer: Self-pay

## 2024-02-03 NOTE — Telephone Encounter (Signed)
 Copied from CRM #[id number]. Topic: Clinical - Order For Equipment >> Feb 03, 2024  9:45 AM Amy B wrote: Reason for CRM: Rep from [facility] called stating they have not received the form for the order of compression garments.  The original form was faxed to them unsigned so they faxed it back again for signature.  They are going to fax it again and ask that it be signed and faxed back to [phone].

## 2024-02-03 NOTE — Telephone Encounter (Signed)
 Waiting PCP to sign to fax it back

## 2024-04-17 ENCOUNTER — Ambulatory Visit: Admitting: Family Medicine

## 2024-04-21 ENCOUNTER — Ambulatory Visit: Admitting: Dermatology

## 2024-04-28 ENCOUNTER — Ambulatory Visit: Admitting: Occupational Therapy

## 2024-05-05 ENCOUNTER — Ambulatory Visit: Admitting: Occupational Therapy

## 2024-05-12 ENCOUNTER — Ambulatory Visit: Admitting: Occupational Therapy

## 2024-05-14 ENCOUNTER — Ambulatory Visit: Admitting: Occupational Therapy

## 2024-05-19 ENCOUNTER — Ambulatory Visit: Admitting: Occupational Therapy

## 2024-05-21 ENCOUNTER — Ambulatory Visit: Admitting: Occupational Therapy

## 2024-05-26 ENCOUNTER — Ambulatory Visit: Admitting: Occupational Therapy

## 2024-05-28 ENCOUNTER — Ambulatory Visit: Admitting: Occupational Therapy

## 2024-06-02 ENCOUNTER — Ambulatory Visit: Admitting: Occupational Therapy

## 2024-06-04 ENCOUNTER — Ambulatory Visit: Admitting: Occupational Therapy

## 2024-06-09 ENCOUNTER — Ambulatory Visit: Admitting: Occupational Therapy

## 2024-06-11 ENCOUNTER — Ambulatory Visit: Admitting: Occupational Therapy

## 2024-06-16 ENCOUNTER — Ambulatory Visit: Admitting: Occupational Therapy

## 2024-06-18 ENCOUNTER — Ambulatory Visit: Admitting: Occupational Therapy

## 2024-06-23 ENCOUNTER — Ambulatory Visit: Admitting: Occupational Therapy

## 2024-06-25 ENCOUNTER — Ambulatory Visit: Admitting: Occupational Therapy

## 2024-06-30 ENCOUNTER — Ambulatory Visit: Admitting: Occupational Therapy

## 2024-07-02 ENCOUNTER — Ambulatory Visit: Admitting: Occupational Therapy

## 2024-07-07 ENCOUNTER — Ambulatory Visit: Admitting: Occupational Therapy

## 2024-07-09 ENCOUNTER — Ambulatory Visit: Admitting: Occupational Therapy

## 2024-07-14 ENCOUNTER — Ambulatory Visit: Admitting: Occupational Therapy

## 2024-07-16 ENCOUNTER — Ambulatory Visit: Admitting: Occupational Therapy

## 2024-07-21 ENCOUNTER — Ambulatory Visit: Admitting: Occupational Therapy

## 2024-07-23 ENCOUNTER — Ambulatory Visit: Admitting: Occupational Therapy

## 2024-07-28 ENCOUNTER — Ambulatory Visit: Admitting: Occupational Therapy

## 2024-10-22 ENCOUNTER — Ambulatory Visit
